# Patient Record
Sex: Male | Born: 1945 | Race: White | Hispanic: No | Marital: Married | Smoking: Former smoker
Health system: Southern US, Community
[De-identification: ages and names within clinical notes are randomized; demographics above are authoritative.]

## PROBLEM LIST (undated history)

## (undated) DIAGNOSIS — E118 Type 2 diabetes mellitus with unspecified complications: Secondary | ICD-10-CM

## (undated) DIAGNOSIS — K760 Fatty (change of) liver, not elsewhere classified: Secondary | ICD-10-CM

## (undated) DIAGNOSIS — E785 Hyperlipidemia, unspecified: Secondary | ICD-10-CM

## (undated) DIAGNOSIS — E119 Type 2 diabetes mellitus without complications: Secondary | ICD-10-CM

## (undated) DIAGNOSIS — G43009 Migraine without aura, not intractable, without status migrainosus: Secondary | ICD-10-CM

## (undated) DIAGNOSIS — I8500 Esophageal varices without bleeding: Secondary | ICD-10-CM

## (undated) DIAGNOSIS — G43019 Migraine without aura, intractable, without status migrainosus: Secondary | ICD-10-CM

## (undated) DIAGNOSIS — R3916 Straining to void: Secondary | ICD-10-CM

## (undated) DIAGNOSIS — D464 Refractory anemia, unspecified: Secondary | ICD-10-CM

## (undated) DIAGNOSIS — N6341 Unspecified lump in right breast, subareolar: Secondary | ICD-10-CM

## (undated) DIAGNOSIS — D508 Other iron deficiency anemias: Secondary | ICD-10-CM

## (undated) DIAGNOSIS — F419 Anxiety disorder, unspecified: Secondary | ICD-10-CM

## (undated) DIAGNOSIS — F3289 Other specified depressive episodes: Secondary | ICD-10-CM

## (undated) DIAGNOSIS — G2581 Restless legs syndrome: Secondary | ICD-10-CM

## (undated) DIAGNOSIS — N401 Enlarged prostate with lower urinary tract symptoms: Secondary | ICD-10-CM

## (undated) DIAGNOSIS — I959 Hypotension, unspecified: Secondary | ICD-10-CM

## (undated) DIAGNOSIS — D500 Iron deficiency anemia secondary to blood loss (chronic): Secondary | ICD-10-CM

## (undated) DIAGNOSIS — R21 Rash and other nonspecific skin eruption: Secondary | ICD-10-CM

## (undated) DIAGNOSIS — R1013 Epigastric pain: Secondary | ICD-10-CM

## (undated) DIAGNOSIS — K7469 Other cirrhosis of liver: Secondary | ICD-10-CM

## (undated) DIAGNOSIS — G894 Chronic pain syndrome: Secondary | ICD-10-CM

## (undated) DIAGNOSIS — R1012 Left upper quadrant pain: Secondary | ICD-10-CM

## (undated) DIAGNOSIS — Z794 Long term (current) use of insulin: Secondary | ICD-10-CM

## (undated) DIAGNOSIS — E1169 Type 2 diabetes mellitus with other specified complication: Secondary | ICD-10-CM

## (undated) DIAGNOSIS — IMO0002 Reserved for concepts with insufficient information to code with codable children: Secondary | ICD-10-CM

## (undated) DIAGNOSIS — K862 Cyst of pancreas: Secondary | ICD-10-CM

## (undated) DIAGNOSIS — E538 Deficiency of other specified B group vitamins: Secondary | ICD-10-CM

## (undated) DIAGNOSIS — I639 Cerebral infarction, unspecified: Secondary | ICD-10-CM

## (undated) DIAGNOSIS — G8929 Other chronic pain: Secondary | ICD-10-CM

## (undated) DIAGNOSIS — K6389 Other specified diseases of intestine: Secondary | ICD-10-CM

## (undated) DIAGNOSIS — K7581 Nonalcoholic steatohepatitis (NASH): Secondary | ICD-10-CM

## (undated) DIAGNOSIS — M51369 Other intervertebral disc degeneration, lumbar region without mention of lumbar back pain or lower extremity pain: Secondary | ICD-10-CM

## (undated) DIAGNOSIS — D696 Thrombocytopenia, unspecified: Principal | ICD-10-CM

## (undated) DIAGNOSIS — K7460 Unspecified cirrhosis of liver: Secondary | ICD-10-CM

## (undated) DIAGNOSIS — K638219 Small intestinal bacterial overgrowth, unspecified: Secondary | ICD-10-CM

## (undated) DIAGNOSIS — Z9889 Other specified postprocedural states: Secondary | ICD-10-CM

## (undated) DIAGNOSIS — M549 Dorsalgia, unspecified: Secondary | ICD-10-CM

## (undated) DIAGNOSIS — A239 Brucellosis, unspecified: Secondary | ICD-10-CM

## (undated) DIAGNOSIS — D509 Iron deficiency anemia, unspecified: Secondary | ICD-10-CM

## (undated) DIAGNOSIS — F329 Major depressive disorder, single episode, unspecified: Secondary | ICD-10-CM

## (undated) DIAGNOSIS — M5136 Other intervertebral disc degeneration, lumbar region: Secondary | ICD-10-CM

## (undated) DIAGNOSIS — F32A Depression, unspecified: Secondary | ICD-10-CM

## (undated) DIAGNOSIS — K219 Gastro-esophageal reflux disease without esophagitis: Secondary | ICD-10-CM

## (undated) DIAGNOSIS — I10 Essential (primary) hypertension: Secondary | ICD-10-CM

## (undated) HISTORY — DX: Deficiency of other specified B group vitamins: E53.8

## (undated) HISTORY — DX: Other chronic pain: G89.29

## (undated) HISTORY — DX: Gastro-esophageal reflux disease without esophagitis: K21.9

## (undated) HISTORY — DX: Reserved for concepts with insufficient information to code with codable children: IMO0002

## (undated) HISTORY — DX: Brucellosis, unspecified: A23.9

## (undated) HISTORY — DX: Esophageal varices without bleeding: I85.00

## (undated) HISTORY — DX: Nonalcoholic steatohepatitis (NASH): K75.81

## (undated) HISTORY — DX: Iron deficiency anemia, unspecified: D50.9

## (undated) HISTORY — DX: Small intestinal bacterial overgrowth, unspecified: K63.8219

## (undated) HISTORY — DX: Other specified postprocedural states: Z98.890

## (undated) HISTORY — PX: CHOLECYSTECTOMY: SHX55

## (undated) HISTORY — DX: Dorsalgia, unspecified: M54.9

## (undated) HISTORY — DX: Other specified diseases of intestine: K63.89

## (undated) HISTORY — DX: Thrombocytopenia, unspecified: D69.6

## (undated) HISTORY — DX: Cerebral infarction, unspecified: I63.9

---

## 1993-06-28 HISTORY — PX: HAND SURGERY: SHX662

## 1999-01-05 NOTE — Procedures (Signed)
[facility]                      NUCLEAR CARDIOLOGY STRESS TEST REPORT   NAME:    William Blanchard, William Blanchard                          SS#:     [id number]   DOB:     03/20/1945                                   AGE:     53   SEX:     M                                            ROOM#:   OP   MR#:     [id number]                                     DATE:    01/05/1999   REFERRING PHYS:   A. Alexander   PRETEST DATA:   ISOTOPE:  Thallium 201; Tc7m Sestamibi   INDICATION:  Palpitations, shortness of breath   MEDS TAKEN:  Prilosec   MEDS HELD:  Ativan, Cardura, Vicodin   TARGET HEART RATE:  169     85%:  144   RISK FACTORS:  Family history; hyperlipidemia; cigarette smoking habit   (3/day)   BASELINE ECG:  Normal sinus rhythm; within normal limits   EXERCISE SUPERVISED BY:  Glynda Jaeger, MSN, FNP   TEST RESULTS:                                       BRUCE PROTOCOL    STAGE   SPEED (MPH)   GRADE (%)  TIME (MIN:SEC)    HR       BP   Resting                                             86     104/62      1         1.7          10           3:00         130    120/68      2         2.5          12           3:00         153    140/79   Recovery                             Immediate      153    139/72   REASON FOR STOPPING:  Achieved target HR         TOTAL EXERCISE TIME:  6:00  ACHIEVED HEART RATE:  153 (--%  max HR)          EST. METS:  7   HR RESPONSE:  Normal                             PEAK RPP:  --   BP RESPONSE:  Normal                             CHEST PAIN:  Typical   angina   OBSERVED DYSRHYTHMIAS:  None   ST SEGMENT CHANGES:  --                                STRESS ECG REPORT   CONCLUSION:  Negative treadmill exercise  tolerance  test.  Typical  angina   with no diagnostic EKG changes.                                 NUCLEAR REPORT   FINDINGS:   (Read  with Dr. Delane Ginger.)   Nuclear  scan  of  the  myocardium   reveals symmetrical uptake of nuclear  material throughout with no  evidence   of  filling  defect.   There  is  no  evidence   of  ischemia  or  previous   infarction.   OVERALL   IMPRESSION:    Normal   Sestamibi    stress    test    with    no   electrocardiographic or scintigraphic  evidence  of  ischemia despite chest   discomfort during stress.   ECG STRESS INTERPRETATION BY:                   NUCLEAR INTERPRETATION BY:   ______________________________________          ___________________________   ______________   Harland German., M.D.                     Joan Flores, M.D.   nr D: 01/05/1999 T: 01/05/1999  3:44 P   wks
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
## 1999-08-09 NOTE — H&P (Signed)
[facility]                              HISTORY AND PHYSICAL   NAME:          William Blanchard, William Blanchard   MR #:          [id number]                     ADM DATE:         08/17/1999   BILLING #:     [id number]                    PT. LOCATION   SS #           [id number]   ATTUPURAM J. Lyn Hollingshead, M.D.   cc:   Luz Lex. Lyn Hollingshead, M.D.         ENDOSCOPY COPY   HISTORY AND REASON FOR ADMISSION: This patient is admitted for colonoscopy.   HISTORY OF PRESENT ILLNESS:  William Blanchard is a 54 year old gentleman, known   case of diverticulosis.  He had an attack of diverticulitis recently and   still continued to have some significant pain.  He had a colonoscopy done a   few years ago which revealed severe diverticular disease of the colon.   Since the patient, after antibiotic treatment of diverticular disease   continues to have crampy type of pain, although he has considerably   improved and one needs to rule out the possibility of a stricture formation   or possible other causes of this pain such as cancer.   PAST MEDICAL HISTORY:  Known case of gastroesophageal reflux disease for a   long time. He has a history of chronic bronchitis because he is a smoker.   No other significant medical problems. In particular, he never had any   blood in his stool or black stools.   SOCIAL HISTORY: The patient used to smoke quite heavily, trying to stop   smoking at the moment.  He does not abuse alcohol.   FAMILY HISTORY:  There is no history of colon cancer in the family.   REVIEW OF SYSTEMS:  Apart from the complaints in the history of present   illness, there is nothing remarkable.   ALLERGIES: ALLERGY TO PENICILLIN.   PHYSICAL EXAMINATION: The patient is alert, oriented times three.  Vital   signs - the pulse is 82 per minute, blood pressure is 112/88, height is   5'6" and weight is 156.  Temperature is 97.8.   SKIN: Warm and dry.  Mucous membranes are moist.    NECK: Supple, there is no thyromegaly.   CARDIA:  Both heart sounds are heard. Rhythm is regular, no murmurs.   LUNGS: Clear to percussion and auscultation.   ABDOMEN:  Soft, there is tenderness in the left lower quadrant, no masses   felt.   Bowel sounds are heard.   EXTREMITIES:  Free of any edema. Peripheral pulsations are equal to the   right radial pulse.   IMPRESSION:   1.  Left lower quadrant pain with history of diverticulitis, rule out colon      cancer.   RECOMMENDATIONS:   1.  Colonoscopy.  I have explained the procedure of colonoscopy with   benefit and risks, in particular risk of perforation, hemorrhage and   infection. The patient agreed for it. Will proceed with it.  I also  explained to the patient about conscious sedation. He agreed for conscious   sedation.

## 1999-08-17 NOTE — Op Note (Signed)
[facility]                                OPERATION REPORT   NAME:         William Blanchard, William Blanchard   MR #:         [id number]                    DATE:           08/17/1999   BILLING #:                                PT. LOCATION:   SS #          [id number]   ATTUPURAM J. Lyn Hollingshead, M.D.   cc:   Luz Lex. Lyn Hollingshead, M.D.   PROCEDURE (S) PERFORMED   Colonoscopy.   DATE OF PROCEDURE:  August 17, 1999.   OPERATOR:  A. Lyn Hollingshead, M.D.   PREMEDICATION:   They were all given by the anesthesiologist.  The patient   was monitored before, during and after the procedure by anesthesiologist.   His condition remained stable. Propofol 170 mg and Versed 3 mg IV push.   Distal part of the cecum, part of sacrum, from different parts of the   colon.    Specimens from the right colon, descending colon, sigmoid colon and   rectum.   INDICATIONS FOR THE PROCEDURE:   This patient has ________and left lower   quadrant pain.  He had treatment for diverticulitis.  He also had a history   of diverticulitis.  The pain is persistent.  This procedure is done to rule   out the possibility of other causes of this pain such as colon cancer or   any other type of colitis.   DESCRIPTION OF OPERATIVE PROCEDURE AND FINDINGS:   The patient was placed   in the left lateral decubitus position and rectal examination was   performed.  The anal canal  __________.  The Olympus video scope was   introduced into the rectum and advanced to the cecum.  The patient   tolerated the procedure well.  The right side of the colon, especially in   the cecum, had some solid stool therefore the bottom of the cecum was   actually not seen.   The rectum had a few hemorrhoids present in the anal verge.  There were a   few erosions in the upper part of the rectum.  The sigmoid was tortuous.   There were multiple diverticula present with a lot of erosions in the folds    without any ulcerations.  The descending colon had a few diverticula with   erosions.  The splenic flexure also had a few erosions present.  The   transverse colon appeared normal.  The hepatic flexure appeared normal.   The ascending colon was normal.  The cecum was also normal in the   visualized area.  Air was taken out of the patient's colon and scope was   removed from the patient.   IMPRESSION   1.  Erosive colitis involving the left colon.   2.  Diverticulosis of the left colon.   RECOMMENDATIONS:  Await biopsy results.  I do not believe that this patient   has got  simply diverticulitis.  He may have another type of colitis.  I  was unable to collect any stool because the stool, which was present in the   colon, was solid in the right side and the left side did not have any   stool.

## 2008-06-28 DIAGNOSIS — Z9889 Other specified postprocedural states: Secondary | ICD-10-CM

## 2008-06-28 HISTORY — DX: Other specified postprocedural states: Z98.890

## 2009-12-07 ENCOUNTER — Ambulatory Visit: Payer: Self-pay | Admitting: Cardiology

## 2009-12-07 ENCOUNTER — Inpatient Hospital Stay (HOSPITAL_COMMUNITY): Admission: EM | Admit: 2009-12-07 | Discharge: 2009-12-09 | Payer: Self-pay | Admitting: Emergency Medicine

## 2009-12-08 ENCOUNTER — Encounter (INDEPENDENT_AMBULATORY_CARE_PROVIDER_SITE_OTHER): Payer: Self-pay | Admitting: Internal Medicine

## 2010-01-16 ENCOUNTER — Ambulatory Visit (HOSPITAL_COMMUNITY): Admission: RE | Admit: 2010-01-16 | Discharge: 2010-01-16 | Payer: Self-pay | Admitting: Family Medicine

## 2010-02-10 ENCOUNTER — Encounter (INDEPENDENT_AMBULATORY_CARE_PROVIDER_SITE_OTHER): Payer: Self-pay | Admitting: *Deleted

## 2010-02-10 ENCOUNTER — Ambulatory Visit: Payer: Self-pay | Admitting: Cardiology

## 2010-02-10 DIAGNOSIS — Z87898 Personal history of other specified conditions: Secondary | ICD-10-CM

## 2010-02-10 DIAGNOSIS — R002 Palpitations: Secondary | ICD-10-CM

## 2010-02-10 DIAGNOSIS — E785 Hyperlipidemia, unspecified: Secondary | ICD-10-CM

## 2010-02-10 DIAGNOSIS — F329 Major depressive disorder, single episode, unspecified: Secondary | ICD-10-CM

## 2010-02-10 DIAGNOSIS — E1149 Type 2 diabetes mellitus with other diabetic neurological complication: Secondary | ICD-10-CM

## 2010-02-10 DIAGNOSIS — G43909 Migraine, unspecified, not intractable, without status migrainosus: Secondary | ICD-10-CM

## 2010-02-10 LAB — CONVERTED CEMR LAB
Magnesium: 2.1 mg/dL
Magnesium: 2.1 mg/dL (ref 1.5–2.5)
TSH: 1.331 microintl units/mL
TSH: 1.331 microintl units/mL (ref 0.350–4.500)

## 2010-02-11 ENCOUNTER — Encounter (INDEPENDENT_AMBULATORY_CARE_PROVIDER_SITE_OTHER): Payer: Self-pay | Admitting: *Deleted

## 2010-02-11 LAB — CONVERTED CEMR LAB
Magnesium: 2.1 mg/dL
TSH: 1.331 microintl units/mL

## 2010-02-13 ENCOUNTER — Ambulatory Visit: Payer: Self-pay | Admitting: Cardiology

## 2010-02-17 ENCOUNTER — Encounter (INDEPENDENT_AMBULATORY_CARE_PROVIDER_SITE_OTHER): Payer: Self-pay

## 2010-02-18 ENCOUNTER — Encounter (INDEPENDENT_AMBULATORY_CARE_PROVIDER_SITE_OTHER): Payer: Self-pay | Admitting: *Deleted

## 2010-02-19 ENCOUNTER — Encounter: Payer: Self-pay | Admitting: Cardiology

## 2010-02-25 ENCOUNTER — Telehealth (INDEPENDENT_AMBULATORY_CARE_PROVIDER_SITE_OTHER): Payer: Self-pay | Admitting: *Deleted

## 2010-02-27 ENCOUNTER — Encounter: Payer: Self-pay | Admitting: Adult Health

## 2010-02-27 ENCOUNTER — Ambulatory Visit: Payer: Self-pay | Admitting: Cardiology

## 2010-02-27 DIAGNOSIS — I6350 Cerebral infarction due to unspecified occlusion or stenosis of unspecified cerebral artery: Secondary | ICD-10-CM | POA: Insufficient documentation

## 2010-02-27 DIAGNOSIS — N2889 Other specified disorders of kidney and ureter: Secondary | ICD-10-CM | POA: Insufficient documentation

## 2010-03-04 ENCOUNTER — Encounter: Payer: Self-pay | Admitting: Adult Health

## 2010-03-07 ENCOUNTER — Encounter: Payer: Self-pay | Admitting: Cardiology

## 2010-03-17 ENCOUNTER — Encounter: Payer: Self-pay | Admitting: Cardiology

## 2010-03-20 ENCOUNTER — Encounter (INDEPENDENT_AMBULATORY_CARE_PROVIDER_SITE_OTHER): Payer: Self-pay | Admitting: *Deleted

## 2010-04-09 ENCOUNTER — Ambulatory Visit (HOSPITAL_COMMUNITY): Admission: RE | Admit: 2010-04-09 | Discharge: 2010-04-09 | Payer: Self-pay | Admitting: Cardiology

## 2010-04-09 ENCOUNTER — Ambulatory Visit: Payer: Self-pay | Admitting: Cardiology

## 2010-04-09 DIAGNOSIS — J209 Acute bronchitis, unspecified: Secondary | ICD-10-CM

## 2010-04-09 LAB — CONVERTED CEMR LAB
ALT: 32 units/L (ref 0–53)
AST: 36 units/L (ref 0–37)
Albumin: 4.9 g/dL (ref 3.5–5.2)
Alkaline Phosphatase: 176 units/L — ABNORMAL HIGH (ref 39–117)
BUN: 17 mg/dL (ref 6–23)
Basophils Absolute: 0 10*3/uL (ref 0.0–0.1)
Basophils Relative: 1 % (ref 0–1)
CO2: 18 meq/L — ABNORMAL LOW (ref 19–32)
Calcium: 9.7 mg/dL (ref 8.4–10.5)
Chloride: 104 meq/L (ref 96–112)
Cholesterol: 122 mg/dL (ref 0–200)
Creatinine, Ser: 1.2 mg/dL (ref 0.40–1.50)
Eosinophils Absolute: 0.1 10*3/uL (ref 0.0–0.7)
Eosinophils Relative: 2 % (ref 0–5)
Glucose, Bld: 212 mg/dL — ABNORMAL HIGH (ref 70–99)
HCT: 38.2 % — ABNORMAL LOW (ref 39.0–52.0)
HDL: 26 mg/dL — ABNORMAL LOW (ref >39)
Hemoglobin: 12.3 g/dL — ABNORMAL LOW (ref 13.0–17.0)
Lymphocytes Relative: 32 % (ref 12–46)
Lymphs Abs: 1.5 10*3/uL (ref 0.7–4.0)
MCHC: 32.2 g/dL (ref 30.0–36.0)
MCV: 93.6 fL (ref 78.0–100.0)
Monocytes Absolute: 0.4 10*3/uL (ref 0.1–1.0)
Monocytes Relative: 9 % (ref 3–12)
Neutro Abs: 2.6 10*3/uL (ref 1.7–7.7)
Neutrophils Relative %: 56 % (ref 43–77)
Platelets: 108 10*3/uL — ABNORMAL LOW (ref 150–400)
Potassium: 4.6 meq/L (ref 3.5–5.3)
RBC: 4.08 M/uL — ABNORMAL LOW (ref 4.22–5.81)
RDW: 15 % (ref 11.5–15.5)
Sodium: 136 meq/L (ref 135–145)
Total Bilirubin: 0.4 mg/dL (ref 0.3–1.2)
Total CHOL/HDL Ratio: 4.7
Total Protein: 7.6 g/dL (ref 6.0–8.3)
Triglycerides: 478 mg/dL — ABNORMAL HIGH (ref <150)
WBC: 4.6 10*3/uL (ref 4.0–10.5)

## 2010-04-14 ENCOUNTER — Encounter (INDEPENDENT_AMBULATORY_CARE_PROVIDER_SITE_OTHER): Payer: Self-pay | Admitting: *Deleted

## 2010-05-18 ENCOUNTER — Encounter
Admission: RE | Admit: 2010-05-18 | Discharge: 2010-06-17 | Payer: Self-pay | Source: Home / Self Care | Admitting: Family Medicine

## 2010-07-28 NOTE — Miscellaneous (Signed)
Summary: labs magnesium,tsh,02/11/2010  Clinical Lists Changes  Observations: Added new observation of MAGNESIUM: 2.1 mg/dL (78/29/5621 30:86) Added new observation of TSH: 1.331 microintl units/mL (02/11/2010 13:08)

## 2010-07-28 NOTE — Letter (Signed)
Summary: EKG 12/23/04  EKG 12/23/04   Imported By: Faythe Ghee 03/17/2010 10:07:21  _____________________________________________________________________  External Attachment:    Type:   Image     Comment:   External Document

## 2010-07-28 NOTE — Assessment & Plan Note (Signed)
Summary: 1 MTH F/U PER CHECKOUT ON 02/10/10/TG   Visit Type:  Follow-up Primary Provider:  Dr.Stephen [name]  CC:  chest pain and sob.  History of Present Illness: Brad Wall is a 65 y/o CM who presents to the office for recurrent chest pain and palpatations.  He was last seen by Dr. Dietrich Pates on 02/10/2010 for these complaints. He has a history of hypertension, newly diagnosed DM, CVA, L renal infarct, with negative stress test in Feb. 2011 in [place]. Records have been requested and received, being reviewed by Dr. Dietrich Pates at t his time.  He took off cardionet monitor as he got a letter in the mail informing him to return it after 1 1/2 weeks.  One week later he began chest discomfort described as tightness and burning substernally lasting for 2 days.  The pain was constant, it did not radiate, cause shortness of breath, diaphoresis or dizziness.  He informed our office of these symptoms, but did not seek medical care at ER.  He has not had chest discomfort since that time, but remains concerned about the episode.    Current Medications (verified): 1)  Citalopram Hydrobromide 40 Mg Tabs (Citalopram Hydrobromide) .... Take 1 Tab Daily 2)  Aspirin 325 Mg Tabs (Aspirin) .... Take 1 Tab Daily 3)  Topamax 50 Mg Tabs (Topiramate) .... Take 1 Tab Am 2 Tabs Pm 4)  Nortriptyline Hcl 75 Mg Caps (Nortriptyline Hcl) .... Take 1 Tab At At Bedtime 5)  Ativan 1 Mg Tabs (Lorazepam) .... Take As Needed 6)  Flexeril 10 Mg Tabs (Cyclobenzaprine Hcl) .... Take 1 Tab Two Times A Day 7)  Flomax 0.4 Mg Caps (Tamsulosin Hcl) .... Take 1 Tab Daily 8)  Lopid 600 Mg Tabs (Gemfibrozil) .... Take 1 Tab Daily 9)  Metformin Hcl 500 Mg Tabs (Metformin Hcl) .... Take 1 Tab Two Times A Day 10)  Niaspan 500 Mg Cr-Tabs (Niacin (Antihyperlipidemic)) .... Take 1 Tab Daily 11)  Norco 10-325 Mg Tabs (Hydrocodone-Acetaminophen) .... Take As Needed 12)  Prilosec 20 Mg Cpdr (Omeprazole) .... Take 1 Tab Two Times A Day 13)   Tenormin 50 Mg Tabs (Atenolol) .... Take 1 Tab Daily 14)  Zocor 10 Mg Tabs (Simvastatin) .... Take 1 Tab Daily 15)  Niaspan 500 Mg Cr-Tabs (Niacin (Antihyperlipidemic)) .... Take 1 Tab Daily 16)  Fioricet 50-325-40 Mg Tabs (Butalbital-Apap-Caffeine) .... Take As Needed For Migrains 17)  Atenolol 50 Mg Tabs (Atenolol) .... Take 1 Tab Daily 18)  Ketorolac Tromethamine 10 Mg Tabs (Ketorolac Tromethamine) .... Take As Needed For Migrains 19)  Prednisone 5 Mg Tabs (Prednisone) .... Take 2 Tabs Am  Allergies (verified): 1)  ! Penicillin  Past History:  Past medical, surgical, family and social histories (including risk factors) reviewed, and no changes noted (except as noted below).  Past Medical History: Reviewed history from 02/10/2010 and no changes required. 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 Cholecystectomy-2011 Colonoscopyx 7; excision of polypsx2 Right hand surgical procedure  Family History: Reviewed history from 02/10/2010 and no changes required. Father died at age 29 with a history of carcinoma of the colon and the lung Mother died at age 67 due to Alzheimer's Siblings: A brother has diabetes.  A sister has been diagnosed with cardiac problems.  Social History: Reviewed history from 02/10/2010 and no changes required. 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   Vital Signs:  Patient profile:   65 year old male Weight:      165 pounds BMI:     24.45 Pulse rate:   84 /  minute BP sitting:   115 / 73  (right arm)  Vitals Entered By: Dreama Saa, CNA (February 27, 2010 1:09 PM)  Physical Exam  General:  Well developed, well nourished, in no acute distress. Lungs:  Clear bilaterally to auscultation and percussion. Heart:  Non-displaced PMI, chest non-tender; regular rate and rhythm, S1, S2 without murmurs, rubs or gallops. Carotid upstroke normal, no bruit. Normal abdominal aortic size, no bruits. Femorals normal pulses, no bruits. Pedals normal pulses. No edema, no varicosities. Abdomen:  Bowel sounds positive; abdomen soft and non-tender without masses, organomegaly, or hernias noted. No hepatosplenomegaly. Msk:  Back normal, normal gait. Muscle strength and tone normal. Pulses:  pulses normal in all 4 extremities Extremities:  No clubbing or cyanosis. Neurologic:  slow speech pattern Psych:  depressed affect.     EKG  Procedure date:  02/27/2010  Findings:      Normal sinus rhythm with rate of:  80 bpm  Impression & Recommendations:  Problem # 1:  CHEST PAIN UNSPECIFIED (ICD-786.50) [name] experienced 2 days of unrelenting epigasteric chest pain approximately 1 week ago at rest. He has had some fatigue since that time.  He denies any other associated symptoms.  This does not appear to be cardiac in etiiology as it was constant for 48 hour length of time.  Review of EKG did not show any evidence of ischemia.  The cardiionet montior has not been read yet.  This may be gastroporesis in the setting of diabetes.  He has had a GI specialist in [facility], but has not followed one since that time.  Would recommend that he be referred to GI and endocrinologist concerning DM symptoms and treatment.  He will keep his appointment with Dr. Dietrich Pates in 2 weeks for discussion of cardiionet and other symptoms.  He is already on a PPI.  No changes in medications at this time. His updated medication list for this problem includes:    Aspirin 325 Mg Tabs (Aspirin)  .Marland Kitchen... Take 1 tab daily    Tenormin 50 Mg Tabs (Atenolol) .Marland Kitchen... Take 1 tab daily    Atenolol 50 Mg Tabs (Atenolol) .Marland Kitchen... Take 1 tab daily  Problem # 2:  DIABETES MELLITUS, TYPE II (ICD-250.00) Assessment: Comment Only follow-up with primary or endocrinologist. His updated medication list for this problem includes:    Aspirin 325 Mg Tabs (Aspirin) .Marland Kitchen... Take 1 tab daily    Metformin Hcl 500 Mg Tabs (Metformin hcl) .Marland Kitchen... Take 1 tab two times a day  Patient Instructions: 1)  Your physician recommends that you schedule a follow-up appointment in: 3 weeks 2)  Your physician recommends that you continue on your current medications as directed. Please refer to the Current Medication list given to you today.

## 2010-07-28 NOTE — Progress Notes (Signed)
Summary: CHERST PAIN / HEART MONITOR  Phone Note Call from Patient Call back at Home Phone [phone]   Caller: PT Reason for Call: Talk to Nurse Summary of Call: PT WAS WEARING HEART MONITOR FOR ABOUT THREE WEEKS AND WAS TOLD TO SEND IT BACK AND YESTERDAY HE HAD THE WORST CHEST PAIN HE HAS EVER HAD DOES HE NEED TO GO BACK ON THE MONITOR? Initial call taken by: Faythe Ghee,  February 25, 2010 10:50 AM  Follow-up for Phone Call        pt sent monitor back early when he received  his bill, pt thought hehad  completed the test. I sent for a full report. placed in your box for review. I also scheduled pt to see KL Friday for increased cp Follow-up by: Teressa Lower RN,  February 25, 2010 4:18 PM
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
## 2010-07-28 NOTE — Miscellaneous (Signed)
Summary: Records received from Dr. [name]  Received requested records from Dr. Michelle Nasuti office on pt. Records given to Dr. Dietrich Pates for reveiw.
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
## 2010-07-28 NOTE — Letter (Signed)
Summary: LABS 04-02-2009  LABS 04-02-2009   Imported By: Faythe Ghee 03/17/2010 10:08:11  _____________________________________________________________________  External Attachment:    Type:   Image     Comment:   External Document

## 2010-07-28 NOTE — Procedures (Signed)
Summary: [facility] 02/13/10-02/20/10  [facility] 02/13/10-02/20/10   Imported By: Faythe Ghee 03/17/2010 10:06:34  _____________________________________________________________________  External Attachment:    Type:   Image     Comment:   External Document

## 2010-07-28 NOTE — Letter (Signed)
Summary: CT ABDOMEN 06/10/04  CT ABDOMEN 06/10/04   Imported By: Faythe Ghee 03/17/2010 10:10:07  _____________________________________________________________________  External Attachment:    Type:   Image     Comment:   External Document

## 2010-07-28 NOTE — Letter (Signed)
Summary: [place] Results Engineer, agricultural at [facility]  [address] [place] [address]   Phone: [phone]  Fax: [phone]      April 14, 2010 MRN: [id number]   Brad Wall [address] [place], [place]  [address]   Dear Mr. [name],  Your test ordered by Selena Batten has been reviewed by your physician (or physician assistant) and was found to be normal or stable. Your physician (or physician assistant) felt no changes were needed at this time.  ____ Echocardiogram  ____ Cardiac Stress Test  __x__ Lab Work  ____ Peripheral vascular study of arms, legs or neck  ____ CT scan or X-ray  ____ Lung or Breathing test  ____ Other:  No change in medical treatment at this time, per Dr. Dietrich Pates.  Thank you, [place] Folden Allyne Gee RN    [place] Bing, MD, Lenise Arena.C.Gaylord Shih, MD, F.A.C.C Lewayne Bunting, MD, F.A.C.C Nona Dell, MD, F.A.C.C Charlton Haws, MD, Lenise Arena.C.C

## 2010-07-28 NOTE — Assessment & Plan Note (Signed)
Summary: 3 wk f/u per checkout on 02/27/10/tg   Visit Type:  Follow-up Primary Provider:  Dr.Stephen [name]   History of Present Illness: [name] returns to the office for continued assessment and treatment of palpitations.  Since his last visit, he has done fairly well.  He has occasional momentary episodes associated with very brief periods of dizziness.  Sometimes he staggers, but does not fall to the ground.  He carried an event recorder, identifying 3 episodes with these symptoms.  No arrhythmias were identified.  He did have infrequent PACs and PVCs at other times.  He reports a few day history of cough with yellow and gray sputum production fleckedt with blood.  He has pain in the back of his throat that he attributes to tonsillitis.  He has some anterior chest discomfort without tenderness and without a pleuritic component.  He has not been dyspneic, but feels punky.  He has a chronic low-grade fever that has not changed.  He developed pneumonia in late 2010 and wonders whether this is a recurrence.  He has had no rigors.  He has not noted nasal discharge, epistaxis or other nasal or sinus problems.  Records previously requested from [facility] were obtained and reviewed.  He was admitted with chest pain in 2005 at which time a stress thallium study was negative.  Diagnoses during that admission included gastroesophageal reflux disease, hyperlipidemia, benign prostatic hypertrophy and degenerative joint disease of the spine with chronic back pain.  He was said to have a history of diverticulitis and rheumatic fever as a child.  A CT scan of the abdomen revealed diffuse fatty infiltration of the liver and minimal renal calcification, but was otherwise normal.  Current Medications (verified): 1)  Citalopram Hydrobromide 40 Mg Tabs (Citalopram Hydrobromide) .... Take 1 Tab Daily 2)  Aspirin 325 Mg Tabs (Aspirin) .... Take 1 Tab Daily 3)  Topamax 50 Mg Tabs (Topiramate) .... Take 1  Tab Am 2 Tabs Pm 4)  Nortriptyline Hcl 75 Mg Caps (Nortriptyline Hcl) .... Take 1 Tab At At Bedtime 5)  Ativan 1 Mg Tabs (Lorazepam) .... Take As Needed 6)  Flexeril 10 Mg Tabs (Cyclobenzaprine Hcl) .... Take 1 Tab Two Times A Day 7)  Flomax 0.4 Mg Caps (Tamsulosin Hcl) .... Take 1 Tab Daily 8)  Lopid 600 Mg Tabs (Gemfibrozil) .... Take 1 Tab Daily 9)  Metformin Hcl 500 Mg Tabs (Metformin Hcl) .... Take 1 Tab Two Times A Day 10)  Niaspan 500 Mg Cr-Tabs (Niacin (Antihyperlipidemic)) .... Take 1 Tab Daily 11)  Norco 10-325 Mg Tabs (Hydrocodone-Acetaminophen) .... Take As Needed 12)  Prilosec 20 Mg Cpdr (Omeprazole) .... Take 1 Tab Two Times A Day 13)  Tenormin 50 Mg Tabs (Atenolol) .... Take 1 Tab Daily 14)  Zocor 10 Mg Tabs (Simvastatin) .... Take 1 Tab Daily 15)  Niaspan 500 Mg Cr-Tabs (Niacin (Antihyperlipidemic)) .... Take 1 Tab Daily 16)  Fioricet 50-325-40 Mg Tabs (Butalbital-Apap-Caffeine) .... Take As Needed For Migrains 17)  Atenolol 50 Mg Tabs (Atenolol) .... Take 1 Tab Daily 18)  Ketorolac Tromethamine 10 Mg Tabs (Ketorolac Tromethamine) .... Take As Needed For Migrains 19)  Reglan 5 Mg/ml Soln (Metoclopramide Hcl) .... Take 1 Tab Qid 20)  Levaquin 500 Mg Tabs (Levofloxacin) .... Take 1 Tablet By Mouth Once A Day X 7 Days  Allergies (verified): 1)  ! Penicillin  Comments:  Nurse/Medical Assistant: patient was started on reglan 5 mg qid per Dr.Knowlton and prednisone  stopped  Past  History:  PMH, FH, and Social History reviewed and updated.  Past Medical History: Chest pain-neg. 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  The patient denies anorexia, fever, weight loss, weight gain, vision loss, hoarseness, syncope, dyspnea on exertion, peripheral edema, headaches, abdominal pain, melena, and hematochezia.    Vital Signs:  Patient profile:   65 year old male Weight:      170 pounds BMI:     25.20 Pulse rate:   72 / minute BP sitting:   100 / 72  (right arm)  Vitals Entered By: Dreama Saa, CNA (April 09, 2010 11:08 AM)  Physical Exam  General:  Proportionate weight and height; well developed; no acute distress; appears mildly anxious and depressed.   Neck-No JVD; no carotid bruits: Endocrine-No thyromegaly: Lungs-No tachypnea, clear without rales, rhonchi or wheezes: CV-normal PMI; normal S1 and S2:; minimal systolic ejection murmur  Abdomen-BS normal; soft and non-tender without masses or organomegaly: MS-No deformities, cyanosis or clubbing: Neurologic-holding speech with difficulty finding words; Nl cranial nerves; symmetric strength and tone: Skin- Warm, no sig. lesions: Extremities-Nl distal pulses; no edema   Impression & Recommendations:  Problem # 1:  PALPITATIONS (ICD-785.1) Event recorder shows no significant arrhythmias and no correlation between rhythm and symptoms.  Magnesium level and TSH are normal.  No further evaluation or therapy is warranted.  Problem # 2:  HEMOPTYSIS (ICD-786.30) No sputum is available for me to examine, but patient and his wife both agree that he has had flecks of blood and otherwise fairly benign appearing sputum.  He has no significant fever and no abnormal findings on examination of the lungs.  This likely  represents bronchitis.  He will be treated with a course of levofloxacin and will follow up with Dr. [name].  A chest x-ray, CBC and other basic laboratory tests are pending.  Problem # 3:  CHEST PAIN (ICD-786.50) Symptoms are now quite modest.  With a negative stress test and no known vascular disease, no further testing will be undertaken at the present time.  Was my pleasure evaluating this nice gentleman.  I will be happy to see him at anytime in the future that Dr. [name] believes I can assist with his care.  Other Orders: T-CBC w/Diff [phone]) T-Lipid Profile [id number]) T-Comprehensive Metabolic Panel [phone]) T-Chest x-ray, 2 views ([id number])  Patient Instructions: 1)  Your physician recommends that you schedule a follow-up appointment in: AS NEEDED 2)  Your physician recommends that you return for lab work in: TODAY 3)  Your physician has recommended you make the following change in your medication: LEVAQIN 500MG  DAILY X 7 DAYS COMPLETE ENITRE ANTIBIOTIC 4)  You have been referred to FOLLOW UP WITH DR.KNOWLTON WITHIN THE WEEK 5)  A chest x-ray takes a picture of the organs and structures inside the chest, including the heart, lungs, and blood vessels. This test can show several things, including, whether the heart is enlarged; whether fluid is building up in the lungs;  and whether pacemaker / defibrillator leads are still in place. 6)  CALL TO THE ED FOR HIGH FEVER , PROFUSE BLEEDING, CONFUSION, OR SIGNIFICANT INCREASE IN SYMPTOMS   Prescriptions: LEVAQUIN 500 MG TABS (LEVOFLOXACIN) Take 1 tablet by mouth once a day x 7 days  #7 x 0   Entered by:   Teressa Lower RN   Authorized by:   Kathlen Brunswick, MD, [facility]   Signed by:   Teressa Lower RN on 04/09/2010   Method used:   Electronically to        [facility]  [address] (retail)       [address]       [place], [place]  [address]       Ph: [phone]       Fax: [phone]   RxID:    [id number]   Handout requested.

## 2010-07-28 NOTE — Miscellaneous (Signed)
Summary: LABS MAGNESIUM,TSH,02/10/2010  Clinical Lists Changes  Observations: Added new observation of MAGNESIUM: 2.1 mg/dL (09/81/1914 78:29) Added new observation of TSH: 1.331 microintl units/mL (02/10/2010 10:46)

## 2010-07-28 NOTE — Letter (Signed)
Summary: [facility] EXERCISE STRESS 04/30/04  [facility] EXERCISE STRESS 04/30/04   Imported By: Faythe Ghee 03/17/2010 10:09:31  _____________________________________________________________________  External Attachment:    Type:   Image     Comment:   External Document

## 2010-07-28 NOTE — Letter (Signed)
Summary:  Results Engineer, agricultural at [facility]  [address], [place] [address]   Phone: [phone]  Fax: [phone]      February 19, 2010 MRN: [id number]   Brad Wall [address] [place], [place]  [address]   Dear Mr. [name],  Your test ordered by Selena Batten has been reviewed by your physician (or physician assistant) and was found to be normal or stable. Your physician (or physician assistant) felt no changes were needed at this time.  ____ Echocardiogram  ____ Cardiac Stress Test  __X__ Lab Work  ____ Peripheral vascular study of arms, legs or neck  ____ CT scan or X-ray  ____ Lung or Breathing test  ____ Other: Please continue on current medical treatment.  Thank you.   [name], MD, F.A.C.C

## 2010-07-28 NOTE — Letter (Signed)
Summary: [facility] HEALTHCARE DISCHARGE SUMMARY 04/29/04  [facility] HEALTHCARE DISCHARGE SUMMARY 04/29/04   Imported By: Faythe Ghee 03/17/2010 10:08:50  _____________________________________________________________________  External Attachment:    Type:   Image     Comment:   External Document

## 2010-08-31 ENCOUNTER — Other Ambulatory Visit (HOSPITAL_COMMUNITY): Payer: Self-pay | Admitting: Family Medicine

## 2010-08-31 DIAGNOSIS — R1013 Epigastric pain: Secondary | ICD-10-CM

## 2010-09-03 ENCOUNTER — Ambulatory Visit (HOSPITAL_COMMUNITY): Payer: Medicare Other

## 2010-09-07 ENCOUNTER — Inpatient Hospital Stay (HOSPITAL_COMMUNITY)
Admission: EM | Admit: 2010-09-07 | Discharge: 2010-09-09 | DRG: 641 | Disposition: A | Discharge status: Home / Self Care | Payer: Medicare Other | Attending: Family Medicine | Admitting: Family Medicine

## 2010-09-07 DIAGNOSIS — K2100 Gastro-esophageal reflux disease with esophagitis, without bleeding: Secondary | ICD-10-CM | POA: Diagnosis present

## 2010-09-07 DIAGNOSIS — A088 Other specified intestinal infections: Secondary | ICD-10-CM | POA: Diagnosis present

## 2010-09-07 DIAGNOSIS — D509 Iron deficiency anemia, unspecified: Secondary | ICD-10-CM | POA: Diagnosis present

## 2010-09-07 DIAGNOSIS — E7800 Pure hypercholesterolemia, unspecified: Secondary | ICD-10-CM | POA: Diagnosis present

## 2010-09-07 DIAGNOSIS — I951 Orthostatic hypotension: Secondary | ICD-10-CM | POA: Diagnosis present

## 2010-09-07 DIAGNOSIS — F329 Major depressive disorder, single episode, unspecified: Secondary | ICD-10-CM | POA: Diagnosis present

## 2010-09-07 DIAGNOSIS — R161 Splenomegaly, not elsewhere classified: Secondary | ICD-10-CM | POA: Diagnosis present

## 2010-09-07 DIAGNOSIS — N400 Enlarged prostate without lower urinary tract symptoms: Secondary | ICD-10-CM | POA: Diagnosis present

## 2010-09-07 DIAGNOSIS — D696 Thrombocytopenia, unspecified: Secondary | ICD-10-CM | POA: Diagnosis present

## 2010-09-07 DIAGNOSIS — F3289 Other specified depressive episodes: Secondary | ICD-10-CM | POA: Diagnosis present

## 2010-09-07 DIAGNOSIS — F05 Delirium due to known physiological condition: Secondary | ICD-10-CM | POA: Diagnosis present

## 2010-09-07 DIAGNOSIS — F411 Generalized anxiety disorder: Secondary | ICD-10-CM | POA: Diagnosis present

## 2010-09-07 DIAGNOSIS — E860 Dehydration: Principal | ICD-10-CM | POA: Diagnosis present

## 2010-09-07 DIAGNOSIS — G43909 Migraine, unspecified, not intractable, without status migrainosus: Secondary | ICD-10-CM | POA: Diagnosis present

## 2010-09-07 HISTORY — DX: Essential (primary) hypertension: I10

## 2010-09-07 LAB — URINALYSIS, ROUTINE W REFLEX MICROSCOPIC
Bilirubin Urine: NEGATIVE
Glucose, UA: NEGATIVE mg/dL
Hgb urine dipstick: NEGATIVE
Ketones, ur: NEGATIVE mg/dL
Nitrite: NEGATIVE
Protein, ur: NEGATIVE mg/dL
Specific Gravity, Urine: 1.01 (ref 1.005–1.030)
Urobilinogen, UA: 0.2 mg/dL (ref 0.0–1.0)
pH: 5.5 (ref 5.0–8.0)

## 2010-09-07 LAB — DIFFERENTIAL
Basophils Relative: 1 % (ref 0–1)
Eosinophils Absolute: 0.1 10*3/uL (ref 0.0–0.7)
Eosinophils Relative: 2 % (ref 0–5)
Lymphs Abs: 1.6 10*3/uL (ref 0.7–4.0)
Monocytes Absolute: 0.4 10*3/uL (ref 0.1–1.0)
Monocytes Relative: 9 % (ref 3–12)
Neutro Abs: 2.2 10*3/uL (ref 1.7–7.7)
Neutrophils Relative %: 51 % (ref 43–77)

## 2010-09-07 LAB — COMPREHENSIVE METABOLIC PANEL
AST: 41 U/L — ABNORMAL HIGH (ref 0–37)
Albumin: 3.5 g/dL (ref 3.5–5.2)
Alkaline Phosphatase: 124 U/L — ABNORMAL HIGH (ref 39–117)
BUN: 11 mg/dL (ref 6–23)
CO2: 18 mEq/L — ABNORMAL LOW (ref 19–32)
Calcium: 9.2 mg/dL (ref 8.4–10.5)
Chloride: 109 mEq/L (ref 96–112)
Creatinine, Ser: 1.13 mg/dL (ref 0.4–1.5)
GFR calc Af Amer: >60 mL/min (ref >60)
GFR calc non Af Amer: >60 mL/min (ref >60)
Glucose, Bld: 105 mg/dL — ABNORMAL HIGH (ref 70–99)
Potassium: 3.9 mEq/L (ref 3.5–5.1)
Total Bilirubin: 0.5 mg/dL (ref 0.3–1.2)
Total Protein: 6.4 g/dL (ref 6.0–8.3)

## 2010-09-07 LAB — CBC
HCT: 30.7 % — ABNORMAL LOW (ref 39.0–52.0)
Hemoglobin: 10.3 g/dL — ABNORMAL LOW (ref 13.0–17.0)
MCH: 28.5 pg (ref 26.0–34.0)
MCHC: 33.6 g/dL (ref 30.0–36.0)
MCV: 84.8 fL (ref 78.0–100.0)
Platelets: 97 10*3/uL — ABNORMAL LOW (ref 150–400)
RBC: 3.62 MIL/uL — ABNORMAL LOW (ref 4.22–5.81)
WBC: 4.4 10*3/uL (ref 4.0–10.5)

## 2010-09-07 LAB — GLUCOSE, CAPILLARY: Glucose-Capillary: 105 mg/dL — ABNORMAL HIGH (ref 70–99)

## 2010-09-08 ENCOUNTER — Inpatient Hospital Stay (HOSPITAL_COMMUNITY): Payer: Medicare Other

## 2010-09-08 ENCOUNTER — Ambulatory Visit (HOSPITAL_COMMUNITY): Payer: Medicare Other

## 2010-09-08 ENCOUNTER — Encounter (HOSPITAL_COMMUNITY): Payer: Self-pay | Admitting: Radiology

## 2010-09-08 DIAGNOSIS — R109 Unspecified abdominal pain: Secondary | ICD-10-CM

## 2010-09-08 DIAGNOSIS — R197 Diarrhea, unspecified: Secondary | ICD-10-CM

## 2010-09-08 DIAGNOSIS — D649 Anemia, unspecified: Secondary | ICD-10-CM

## 2010-09-08 LAB — IRON AND TIBC
Iron: 43 ug/dL (ref 42–135)
UIBC: 327 ug/dL

## 2010-09-08 LAB — CBC
HCT: 29.9 % — ABNORMAL LOW (ref 39.0–52.0)
Hemoglobin: 9.6 g/dL — ABNORMAL LOW (ref 13.0–17.0)
MCH: 27.4 pg (ref 26.0–34.0)
MCHC: 32.1 g/dL (ref 30.0–36.0)
Platelets: 92 10*3/uL — ABNORMAL LOW (ref 150–400)
RBC: 3.51 MIL/uL — ABNORMAL LOW (ref 4.22–5.81)
RDW: 15 % (ref 11.5–15.5)
WBC: 4 10*3/uL (ref 4.0–10.5)

## 2010-09-08 LAB — DIFFERENTIAL
Basophils Absolute: 0 10*3/uL (ref 0.0–0.1)
Basophils Relative: 1 % (ref 0–1)
Eosinophils Absolute: 0.1 10*3/uL (ref 0.0–0.7)
Eosinophils Relative: 2 % (ref 0–5)
Lymphocytes Relative: 37 % (ref 12–46)
Monocytes Absolute: 0.3 10*3/uL (ref 0.1–1.0)
Monocytes Relative: 7 % (ref 3–12)
Neutro Abs: 2.1 10*3/uL (ref 1.7–7.7)
Neutrophils Relative %: 54 % (ref 43–77)

## 2010-09-08 LAB — VITAMIN B12: Vitamin B-12: 365 pg/mL (ref 211–911)

## 2010-09-08 LAB — BASIC METABOLIC PANEL
BUN: 8 mg/dL (ref 6–23)
CO2: 19 mEq/L (ref 19–32)
Calcium: 8.9 mg/dL (ref 8.4–10.5)
Creatinine, Ser: 1.01 mg/dL (ref 0.4–1.5)
GFR calc Af Amer: >60 mL/min (ref >60)
GFR calc non Af Amer: >60 mL/min (ref >60)
Glucose, Bld: 111 mg/dL — ABNORMAL HIGH (ref 70–99)
Sodium: 141 mEq/L (ref 135–145)

## 2010-09-08 LAB — FOLATE: Folate: 19.5 ng/mL

## 2010-09-08 LAB — GLUCOSE, CAPILLARY
Glucose-Capillary: 118 mg/dL — ABNORMAL HIGH (ref 70–99)
Glucose-Capillary: 168 mg/dL — ABNORMAL HIGH (ref 70–99)

## 2010-09-08 LAB — FERRITIN: Ferritin: 61 ng/mL (ref 22–322)

## 2010-09-08 MED ORDER — IOHEXOL 300 MG/ML  SOLN
100.0000 mL | Freq: Once | INTRAMUSCULAR | Status: AC | PRN
  Administered 2010-09-08: 100 mL via INTRAVENOUS

## 2010-09-09 DIAGNOSIS — K5289 Other specified noninfective gastroenteritis and colitis: Secondary | ICD-10-CM

## 2010-09-09 LAB — GLUCOSE, CAPILLARY: Glucose-Capillary: 103 mg/dL — ABNORMAL HIGH (ref 70–99)

## 2010-09-09 LAB — DIFFERENTIAL
Basophils Absolute: 0 10*3/uL (ref 0.0–0.1)
Eosinophils Absolute: 0.1 10*3/uL (ref 0.0–0.7)
Eosinophils Relative: 2 % (ref 0–5)
Lymphocytes Relative: 32 % (ref 12–46)
Monocytes Absolute: 0.3 10*3/uL (ref 0.1–1.0)

## 2010-09-09 LAB — CBC
HCT: 29.1 % — ABNORMAL LOW (ref 39.0–52.0)
MCHC: 32.3 g/dL (ref 30.0–36.0)
RDW: 15.6 % — ABNORMAL HIGH (ref 11.5–15.5)

## 2010-09-10 NOTE — Progress Notes (Signed)
  NAME:  Brad Wall, Brad Wall NO.:  [id number]  MEDICAL RECORD NO.:  [id number]           PATIENT TYPE:  I  LOCATION:  A316                          FACILITY:  [facility]  PHYSICIAN:  Mila Homer. [name], M.D.DATE OF BIRTH:  June 07, 1946  DATE OF PROCEDURE:  09/08/2010 DATE OF DISCHARGE:                                PROGRESS NOTE   SUBJECTIVE:  Feels better today.  His wife is with him in the room and tells me that his speech is still off occasionally but the confusion seems to have cleared.  OBJECTIVE:  Temperature 98.9, pulse 78, respiratory 20, blood pressure 132/68.  His color is good.  His speech appears to be normal but occasionally he has halting type speech.  Sentence structure was intact and sensorium appears to a good.  His lungs are clear throughout.  His heart has regular rhythm and rate about 80.  He still has some abdominal tenderness in epigastric region.  He has had diverticulitis and polyps in the past.  Wife tells me his last colonoscopy was within the last 2 TO 3 years which is, which was done in [place] when he lived there.  It has been years since he has had an EGD.  Today's white cell count is 4000 of which 92% are neutrophils, hemoglobin 9.6 and sugars being 118, 168.  O2 sats 96% room air.  ASSESSMENT: 1. Diarrhea, question etiology. 2. History of diverticula. 3. History of colonic polyps. 4. Benign essential hypertension. 5. Epigastric pain, question etiology. 6. Chronic depression. 7. Migraine headaches. 8. Benign prostatic hypertrophy. 9. Dehydration, cleared.  PLAN:  He is due for abdominal and pelvic CT today with contrast.  He will continue IV fluids.  Recheck a CBC and BMP in the morning.  Anemia profile is also pending.  I discussed his case with the PA for [facility] gastroenterology.  GI service will see him today given the persistent problems and persistent abdominal pain.  He may need an EGD for this evaluation.  His  diarrhea really has cleared since yesterday and he really has not had any in almost a day now.     Mila Homer. [name], M.D.     SDK/MEDQ  D:  09/08/2010  T:  09/08/2010  Job:  [id number]  Electronically Signed by John Giovanni M.D. on 09/10/2010 05:01:30 AM

## 2010-09-10 NOTE — Discharge Summary (Signed)
NAME:  Brad Wall, Brad Wall NO.:  [id number]  MEDICAL RECORD NO.:  [id number]           PATIENT TYPE:  I  LOCATION:  A316                          FACILITY:  [facility]  PHYSICIAN:  Mila Homer. [name], M.D.DATE OF BIRTH:  06/30/1945  DATE OF ADMISSION:  09/07/2010 DATE OF DISCHARGE:  03/14/2012LH                              DISCHARGE SUMMARY   This 65 year old was admitted to the hospital with severe dehydration and anemia.  He was also confused initially.  He had a benign 3-day hospitalization extending from September 07, 2010 to September 09, 2010.  Vital signs remained stable once he was rehydrated.  He did have some problem with orthostatic changes in his blood pressure, however, initially.  His admission white cell count was 4400, of which 51% were neutrophils, and his platelet count was 97,000.  His hemoglobin was 10.3.  Recheck CBC showed the hemoglobin had dropped to 9.6 and the platelet count was 92,000.  On the day of discharge hemoglobin was 9.4, platelet count 83,000.  His admission CMP showed a bicarb of 18, a glucose of 105, alk phos 124, SGOT 41.  Recheck BMP the following day showed a chloride of 116, glucose 111.  His UA was negative.  His anemia panel showed an iron level of 43, lower limits of normal, and a percent saturation of 12% (normal 20-55%).  His vitamin B12 level was 365.  He had an abdominal pelvic CT with contrast which showed some cecal and proximal ascending colonic wall thickening and associated pericolonic inflammatory changes and free fluid felt to be compatible with a focal colitis.  He had splenomegaly and sigmoid diverticula and nonobstructive right renal calculi.  He was admitted to the hospital.  He initially received 3-1/2 liters of IV fluid in the emergency department and then was put on IV normal saline at 100 mL/hour.  He was given Protonix 40 mg IV q.24 h., topiramate 50 mg 1 in the a.m. and 2 in the p.m., lorazepam 1 mg  t.i.d. p.r.n. anxiety, citalopram 40 mg daily, nortriptyline 25 mg 2 capsules q.h.s., cyclobenzaprine 10 mg t.i.d., hydrocodone/APAP 10/325 b.i.d. p.r.n., and NovoLog sliding-scale insulin.  A Clostridium difficile antigen level was ordered as was culture and sensitivity on his bowel movements but once he was admitted to the hospital he had no further BMs.  By his second day he was up in a chair and he was able to ambulate with assistance but he was still fairly weak and even his third day he felt a little dizzy standing up, so it was recommended that when he went home that he would only stand with assistance and use a walker at home until he recovered.  He was seen by [facility] while in the hospital.  FINAL DISCHARGE DIAGNOSES: 1. Severe dehydration. 2. Confusion secondary to dehydration and illness. 3. Probable iron-deficiency anemia. 4. Thrombocytopenia, possibly related to his underlying probable viral     process. 5. Splenomegaly, question etiology. 6. Chronic migraine headaches. 7. Reflux esophagitis. 8. Chronic depression. 9. Anxiety. 10.Benign prostatic hypertrophy. 11.Hypercholesterolemia.  The patient, his wife and I discussed  all of this.  It is possible he might have had a small gastrointestinal bleed, however, since there was no further diarrhea and his abdominal pain was better, this was not investigated at this time.  If he has any further problems, this could done outpatient, however.  He will be followed up in my office in 1 week.  DISCHARGE MEDICATIONS:  Include lorazepam 1 mg t.i.d. p.r.n. anxiety, aspirin 325 mg daily, citalopram 40 mg daily, Fioricet as needed for headache, Flexeril 10 mg t.i.d., Flomax 0.4 mg daily, gemfibrozil 600 mg b.i.d., metformin 500 mg b.i.d., metoclopramide 5 mg a.c. and h.s., Niaspan SR 500 mg daily, hydrocodone/APAP 10/325 q.i.d. as needed for pain, nortriptyline 25 mg 3 q.h.s., omeprazole 20 mg 2 daily,  atenolol 50 mg b.i.d., topiramate 50 mg 1 the morning and 2 in the afternoon, and simvastatin 10 mg daily.  I wrote him a prescription for nortriptyline 25 mg (number 90 with 11 refills).  When he is seen back in the office, will recheck his hemoglobin and hematocrit .     Mila Homer. [name], M.D.     SDK/MEDQ  D:  09/09/2010  T:  09/09/2010  Job:  [id number]  Electronically Signed by John Giovanni M.D. on 09/10/2010 05:01:12 AM

## 2010-09-10 NOTE — H&P (Signed)
NAME:  DIETRICK, BARRIS NO.:  [id number]  MEDICAL RECORD NO.:  [id number]           PATIENT TYPE:  I  LOCATION:  A316                          FACILITY:  [facility]  PHYSICIAN:  Mila Homer. [name], M.D.DATE OF BIRTH:  06/14/1946  DATE OF ADMISSION:  09/07/2010 DATE OF DISCHARGE:  LH                             HISTORY & PHYSICAL   HISTORY:  This 65 year old man presented to the emergency room at [facility] this afternoon due to profuse diarrhea, which had gone on for 5 days but in addition confusion, which he developed at home today.  His wife, who has been married to him for over 40 years, said this is the sickest she has ever seen him and she has never seen him this confused.  He was disoriented.  He has been having some chronic upper abdominal pain for several months and was due for an outpatient CT scan of the abdomen and pelvis tomorrow.  MEDICAL PROBLEMS:  Medical problems have included hypertension, diabetes, hypercholesterolemia, migraine headache, chronic pain. He does have a history of a cholecystectomy.  Recently he broke a tooth off and needed Biaxin 500 mg daily b.i.d. for some infection around that tooth. He just finished that recently.  With this particular episode, he developed nausea and vomiting about 5 days ago and within a day or two this has cleared.  He then developed diarrhea but in addition several days ago he developed a temperature to 101.  CURRENT MEDICATIONS:  Atenolol 50 mg b.i.d., tamsulosin 0.4 mg daily, simvastatin 10 mg daily, Niaspan extended release 500 mg daily, topiramate 50 mg 1 tablet in the a.m. and 2 tablets in the p.m., metoclopramide 5 mg a.c. and h.s. lorazepam 1 mg t.i.d., aspirin 325 mg daily, metformin 500 mg b.i.d., citalopram 40 mg daily, nortriptyline 25 mg two to three caps q.h.s., cyclobenzaprine 10 mg t.i.d., gemfibrozil 600 mg b.i.d., hydrocodone/APAP-10/325 q.i.d. for pain, omeprazole 20  mg two tablets daily, Fioricet 50/325/40 p.r.n. headache, naproxen 500 mg b.i.d., Humulin R by sliding scale.  SOCIAL HISTORY:  He lives with his wife.  His son lives nearby.  ADMISSION EXAMINATION:  GENERAL:  A pleasant middle-aged man.  At the time I saw him he was feeling a good deal better, after having been in the emergency room over 8 hours.  His color was good.  His speech was normal and his sensorium appeared to be intact.  His wife, who is with him, acknowledged that he was much improved.  At this point he already had already had 3.5 liter of IV fluids.  The decision was made for admission given that he still had postural changes and his systolic blood pressure was in the mid 80s despite the IV fluids. ENT:  Mucous membranes are moist. SKIN:  Turgor was normal. LYMPHATIC:  There is no axillary, supraclavicular, or anterior cervical adenopathy. HEART:  Regular rhythm, rate of about 80. LUNGS:  Clear throughout. ABDOMEN:  The abdomen was soft without organomegaly or mass but there is tenderness in the upper abdomen on deep palpation. EXTREMITIES:  There is no edema of the  ankles.  LABORATORY DATA:  His admission white cell count was 4400, of which 51% were neutrophils, 37% lymphocytes.  His hemoglobin was 10.3 and platelet count 97,000.  CMP showed a bicarb of 18, glucose 105 with a BUN of 11, creatinine 1.13 and alk phos slightly elevated at 124 and SGOT of 41. His admission UA showed specific gravity 1.010, pH 5.5.  ADMISSION DIAGNOSES: 1. Nausea, vomiting and diarrhea along with fever which may represent     a viral syndrome or infection with Clostridium difficile. 2. Dehydration. 3. Abdominal pain of 2 months' duration. 4. Hypercholesterolemia. 5. Benign prostatic hypertrophy. 6. Reflux esophagitis. 7. Depression. 8. Anxiety. 9. Migraine headache.  PLAN:  Discussed this case with the ER physician as well as the nursing staff.  I have talked to the patient and his  wife.  He will be admitted fully given the constellation of symptoms and signs.  He will be on IV normal saline 100 mL/hour and will continue him on sliding scale insulin, nortriptyline 25 mg two q.h.s., lorazepam 1 mg t.i.d., and topiramate 50 mg one in the a.m. and two in the p.m. him. I am holding his other medications. Tomorrow morning we will recheck a CBC and a BMP. He will have his abdominal and pelvic CT scan at that time.  I have put him on IV Protonix 40 mg at this point.     Mila Homer. [name], M.D.     SDK/MEDQ  D:  09/07/2010  T:  09/08/2010  Job:  [id number]  Electronically Signed by John Giovanni M.D. on 09/10/2010 04:59:27 AM

## 2010-09-14 LAB — URINALYSIS, ROUTINE W REFLEX MICROSCOPIC
Bilirubin Urine: NEGATIVE
Hgb urine dipstick: NEGATIVE
Protein, ur: NEGATIVE mg/dL
Specific Gravity, Urine: <1.005 — ABNORMAL LOW (ref 1.005–1.030)
Urobilinogen, UA: 0.2 mg/dL (ref 0.0–1.0)

## 2010-09-14 LAB — BASIC METABOLIC PANEL
Calcium: 9.2 mg/dL (ref 8.4–10.5)
GFR calc Af Amer: >60 mL/min (ref >60)
GFR calc non Af Amer: >60 mL/min (ref >60)
Sodium: 138 mEq/L (ref 135–145)

## 2010-09-14 LAB — DIFFERENTIAL
Basophils Absolute: 0 10*3/uL (ref 0.0–0.1)
Basophils Absolute: 0 10*3/uL (ref 0.0–0.1)
Basophils Absolute: 0 10*3/uL (ref 0.0–0.1)
Basophils Relative: 0 % (ref 0–1)
Eosinophils Absolute: 0.1 10*3/uL (ref 0.0–0.7)
Eosinophils Absolute: 0.1 10*3/uL (ref 0.0–0.7)
Eosinophils Relative: 2 % (ref 0–5)
Lymphocytes Relative: 27 % (ref 12–46)
Monocytes Absolute: 0.3 10*3/uL (ref 0.1–1.0)
Monocytes Relative: 7 % (ref 3–12)
Neutro Abs: 2.9 10*3/uL (ref 1.7–7.7)
Neutro Abs: 3.1 10*3/uL (ref 1.7–7.7)
Neutrophils Relative %: 66 % (ref 43–77)

## 2010-09-14 LAB — CARDIAC PANEL(CRET KIN+CKTOT+MB+TROPI)
CK, MB: 0.6 ng/mL (ref 0.3–4.0)
Relative Index: INVALID (ref 0.0–2.5)
Relative Index: INVALID (ref 0.0–2.5)
Total CK: 34 U/L (ref 7–232)
Total CK: 34 U/L (ref 7–232)
Total CK: 36 U/L (ref 7–232)

## 2010-09-14 LAB — COMPREHENSIVE METABOLIC PANEL
ALT: 34 U/L (ref 0–53)
AST: 41 U/L — ABNORMAL HIGH (ref 0–37)
BUN: 9 mg/dL (ref 6–23)
CO2: 22 mEq/L (ref 19–32)
CO2: 24 mEq/L (ref 19–32)
Chloride: 105 mEq/L (ref 96–112)
Chloride: 108 mEq/L (ref 96–112)
Creatinine, Ser: 1.04 mg/dL (ref 0.4–1.5)
GFR calc Af Amer: >60 mL/min (ref >60)
GFR calc non Af Amer: >60 mL/min (ref >60)
GFR calc non Af Amer: >60 mL/min (ref >60)
Glucose, Bld: 121 mg/dL — ABNORMAL HIGH (ref 70–99)
Sodium: 136 mEq/L (ref 135–145)
Total Bilirubin: 0.3 mg/dL (ref 0.3–1.2)
Total Bilirubin: 0.5 mg/dL (ref 0.3–1.2)

## 2010-09-14 LAB — CBC
HCT: 33.8 % — ABNORMAL LOW (ref 39.0–52.0)
Hemoglobin: 11.4 g/dL — ABNORMAL LOW (ref 13.0–17.0)
Hemoglobin: 11.9 g/dL — ABNORMAL LOW (ref 13.0–17.0)
MCV: 91.7 fL (ref 78.0–100.0)
Platelets: 78 10*3/uL — ABNORMAL LOW (ref 150–400)
RBC: 3.69 MIL/uL — ABNORMAL LOW (ref 4.22–5.81)
RBC: 3.82 MIL/uL — ABNORMAL LOW (ref 4.22–5.81)
RBC: 3.94 MIL/uL — ABNORMAL LOW (ref 4.22–5.81)
RDW: 15.4 % (ref 11.5–15.5)
WBC: 3.9 10*3/uL — ABNORMAL LOW (ref 4.0–10.5)
WBC: 4.7 10*3/uL (ref 4.0–10.5)

## 2010-09-14 LAB — GLUCOSE, CAPILLARY
Glucose-Capillary: 141 mg/dL — ABNORMAL HIGH (ref 70–99)
Glucose-Capillary: 144 mg/dL — ABNORMAL HIGH (ref 70–99)

## 2010-09-14 LAB — LIPID PANEL
HDL: 29 mg/dL — ABNORMAL LOW (ref >39)
LDL Cholesterol: 34 mg/dL (ref 0–99)
Total CHOL/HDL Ratio: 3.9 RATIO
VLDL: 51 mg/dL — ABNORMAL HIGH (ref 0–40)

## 2010-09-14 LAB — RHEUMATOID FACTOR: Rhuematoid fact SerPl-aCnc: <20 IU/mL (ref 0–20)

## 2010-09-14 LAB — URINE CULTURE: Culture: NO GROWTH

## 2010-09-14 LAB — VITAMIN D 25 HYDROXY (VIT D DEFICIENCY, FRACTURES): Vit D, 25-Hydroxy: 20 ng/mL — ABNORMAL LOW (ref 30–89)

## 2010-09-14 LAB — SEDIMENTATION RATE: Sed Rate: 8 mm/hr (ref 0–16)

## 2010-09-14 LAB — POCT CARDIAC MARKERS: Troponin i, poc: <0.05 ng/mL (ref 0.00–0.09)

## 2010-09-14 LAB — CORTISOL: Cortisol, Plasma: 14.1 ug/dL

## 2010-09-14 LAB — PROTIME-INR: Prothrombin Time: 14.8 seconds (ref 11.6–15.2)

## 2010-09-14 LAB — URIC ACID: Uric Acid, Serum: 4.9 mg/dL (ref 4.0–7.8)

## 2010-09-14 LAB — TSH: TSH: 0.924 u[IU]/mL (ref 0.350–4.500)

## 2010-09-14 LAB — MAGNESIUM: Magnesium: 2.1 mg/dL (ref 1.5–2.5)

## 2010-10-18 NOTE — Consult Note (Signed)
NAME:  Brad Wall, Brad Wall NO.:  [id number]  MEDICAL RECORD NO.:  [id number]           PATIENT TYPE:  I  LOCATION:  A316                          FACILITY:  [facility]  PHYSICIAN:  Lorenza Burton, N.P.    DATE OF BIRTH:  Sep 11, 1945  DATE OF CONSULTATION: DATE OF DISCHARGE:                                CONSULTATION   REQUESTING PHYSICIAN:  Mila Homer. [name], MD  PHYSICIAN SIGNING NOTEJonathon Bellows, MD  REASON FOR CONSULTATION:  Abdominal pain, anemia, and diarrhea.  HISTORY OF PRESENT ILLNESS:  Brad Wall is a 64-year Caucasian male who was admitted to [facility] for dehydration and diarrhea and confusion.  He tells me he has had a 10-day history of diarrhea.  It did resolve yesterday.  He also has generalized abdominal pain with this and found to be anemic.  He describes the abdominal pain as mostly his upper abdomen.  He tells me he had anywhere from 10-15 dark black bowel movement prior to yesterday per day.  He denies any mucus or bright red blood in his stools.  He describes the upper abdominal pain as 10/10 at worst.  It is 0/10 now.  He has had nausea and vomiting in the first couple days with the onset of this.  He also had fever around 101.  He denied any hematemesis.  He denies any ill contacts.  He just finished Biaxin for dental abscess yesterday.  He has had very bad heartburn and indigestion the last 2 weeks as well.  He has been on b.i.d. Prilosec for years with history of chronic GERD.  He tells me he is trying to lose weight and has lost a couple of pounds and eating less.  His hemoglobin was found to be 9.6, hematocrit 29.9, platelets 92,000.  His alkaline phosphatase is slightly elevated at 124, AST at 41, otherwise normal LFTs.  PAST MEDICAL AND SURGICAL HISTORY:  He tells me he has had 2 CVAs, the last one being around 3 years ago.  He has history of hypertension, diabetes mellitus, chronic GERD, hyperlipidemia, migraine  headaches, chronic back pain which [facility] [place]. Currently he is by Dr. [name].  He had a cholecystectomy about 2 years ago for stones.  He has had diverticulosis, multiple adenomatous polyps. He tells me his last colonoscopy was about 2 years ago by Dr. Tenny Craw in [place], [place].  He has had 2 EGDs which he tells me were normal.  MEDICATIONS PRIOR TO ADMISSION: 1. Atenolol 50 mg b.i.d. 2. Citalopram 40 mg daily. 3. Tamsulosin 0.4 mg daily. 4. Nortriptyline 25 mg 2-3 at bedtime. 5. Cyclobenzaprine 10 mg t.i.d. 6. Gemfibrozil 600 mg b.i.d. 7. Hydrocodone/APAP 10/325 mg q.i.d. p.r.n. pain. 8. Omeprazole 20 mg b.i.d. 9. Fioricet 50/325/40 mg p.r.n. headache. 10.Naproxen 500 mg b.i.d. 11.Humulin R sliding scale insulin. 12.Topiramate 50 mg in the a.m., 100 mg in the p.m. 13.Reglan 5 mg before meals and at bedtime. 14.Lorazepam 1 mg t.i.d. 15.Aspirin 325 mg daily. 16.Metformin 500 mg b.i.d. 17.Atenolol 50 mg b.i.d.  ALLERGIES:  PENICILLIN.  FAMILY  HISTORY:  There is no known family history of carcinoma of the liver or chronic GI problems.  SOCIAL HISTORY:  Brad Wall has been married for 42 years.  He recently moved to [place] from [place] April of 2011.  His son lives here as well.  He has 5 healthy children.  He denies any tobacco, alcohol, or drug use.  He retired 5 years ago and was previously a Production designer, theatre/television/film but cannot tell me what type of business.  REVIEW OF SYSTEMS:  See HPI.  He is complaining of some memory problems and confusion, otherwise negative review of systems.  PHYSICAL EXAMINATION:  VITAL SIGNS:  Temperature 98.9, pulse 78, respirations 20, blood pressure 132/68, O2 sat 96% on room air, weight 76.657 kg, height 68 inches. GENERAL:  He is an alert, oriented, pleasant, cooperative Caucasian male in no acute distress. HEENT:  Sclerae clear and nonicteric.  Conjunctivae pale.  Oropharynx pink and moist without  any lesions. NECK:  Supple without thyromegaly. CHEST:  Heart regular rate and rhythm.  Normal S1, S2.  No murmurs, clicks, rubs, or gallops. LUNGS:  Clear to auscultation bilaterally. ABDOMEN:  Protuberant.  Positive bowel sounds x4.  No bruits auscultated.  Soft, nontender, nondistended.  Unable to palpate hepatosplenomegaly or mass.  No rebound tenderness or guarding.  Exam is limited given the patient's body habitus. EXTREMITIES:  Without edema bilaterally. SKIN:  Pale, warm, and dry.  LABORATORY STUDIES:  White blood cell count 4, calcium 8.9, sodium 141, potassium 3.9, chloride 116, CO2 19, BUN 8, creatinine 1.01 and glucose 111, total bilirubin 0.5, ALT 32, total protein 6.4, albumin 3.5.  His INR was normal.  Urinalysis was negative.  IMPRESSION:  Brad Wall is a 65 year old Caucasian male with a 10-day history of diarrhea, abdominal pain, and anemia.  He has history of chronic back pain, gastroesophageal reflux disease, adenomatous polyps, and diverticulosis.  His hemoglobin has dropped to 9.6, platelets 92,000.  He has a mildly elevated alkaline phosphatase and AST.  There is no known history of liver disease.  His diarrhea has resolved at present.  He had very dark black stools, possible melena, on aspirin and naproxen b.i.d.  I would question history of peptic ulcer disease or GI bleed in the proximal GI tract.  Also concerning is recent antibiotic use and diarrhea and we need to rule out C. difficile.  It is possible that diarrhea may have been fully related to Biaxin versus an infectious process.  PLAN: 1. Obtain colonoscopy, EGD, and biopsy report from Dr. Tenny Craw in     [place] to determine timing of the next colonoscopy. 2. Agree with IV fluids and PPI. 3. We will follow up anemia panel. 4. We will follow up stool studies including C diff. 5. We will follow up CT as ordered and be sure that he gets IV and     oral contrast. 6. CBC in the morning. 7.  Hemoccult stools x3. 8. Further recommendations to follow.  We would like to thank Dr. [name] for allowing [place] to participate in the care of Brad Wall.     Lorenza Burton, N.P.     KJ/MEDQ  D:  09/08/2010  T:  09/08/2010  Job:  [id number]  cc:   Mila Homer. [name], M.D. Fax: [phone]  Electronically Signed by Lorenza Burton N.P. on 09/10/2010 08:25:59 AM Electronically Signed by Lorrin Goodell M.D. on 10/18/2010 02:07:58 PM

## 2010-12-21 ENCOUNTER — Emergency Department (HOSPITAL_COMMUNITY): Payer: Medicare Other

## 2010-12-21 ENCOUNTER — Observation Stay (HOSPITAL_COMMUNITY)
Admission: EM | Admit: 2010-12-21 | Discharge: 2010-12-23 | Disposition: A | Discharge status: Home / Self Care | Payer: Medicare Other | Attending: Internal Medicine | Admitting: Internal Medicine

## 2010-12-21 ENCOUNTER — Other Ambulatory Visit (HOSPITAL_COMMUNITY): Payer: Self-pay | Admitting: Family Medicine

## 2010-12-21 DIAGNOSIS — F3289 Other specified depressive episodes: Secondary | ICD-10-CM | POA: Insufficient documentation

## 2010-12-21 DIAGNOSIS — Z794 Long term (current) use of insulin: Secondary | ICD-10-CM | POA: Insufficient documentation

## 2010-12-21 DIAGNOSIS — G9332 Myalgic encephalomyelitis/chronic fatigue syndrome: Secondary | ICD-10-CM | POA: Insufficient documentation

## 2010-12-21 DIAGNOSIS — R161 Splenomegaly, not elsewhere classified: Principal | ICD-10-CM | POA: Insufficient documentation

## 2010-12-21 DIAGNOSIS — R5382 Chronic fatigue, unspecified: Secondary | ICD-10-CM | POA: Insufficient documentation

## 2010-12-21 DIAGNOSIS — R160 Hepatomegaly, not elsewhere classified: Secondary | ICD-10-CM | POA: Insufficient documentation

## 2010-12-21 DIAGNOSIS — M545 Low back pain, unspecified: Secondary | ICD-10-CM | POA: Insufficient documentation

## 2010-12-21 DIAGNOSIS — F329 Major depressive disorder, single episode, unspecified: Secondary | ICD-10-CM | POA: Insufficient documentation

## 2010-12-21 DIAGNOSIS — E7800 Pure hypercholesterolemia, unspecified: Secondary | ICD-10-CM | POA: Insufficient documentation

## 2010-12-21 DIAGNOSIS — Z8673 Personal history of transient ischemic attack (TIA), and cerebral infarction without residual deficits: Secondary | ICD-10-CM | POA: Insufficient documentation

## 2010-12-21 DIAGNOSIS — D61818 Other pancytopenia: Secondary | ICD-10-CM | POA: Insufficient documentation

## 2010-12-21 DIAGNOSIS — E119 Type 2 diabetes mellitus without complications: Secondary | ICD-10-CM | POA: Insufficient documentation

## 2010-12-21 DIAGNOSIS — G40802 Other epilepsy, not intractable, without status epilepticus: Secondary | ICD-10-CM | POA: Insufficient documentation

## 2010-12-21 DIAGNOSIS — M041 Periodic fever syndromes: Secondary | ICD-10-CM | POA: Insufficient documentation

## 2010-12-21 DIAGNOSIS — I10 Essential (primary) hypertension: Secondary | ICD-10-CM | POA: Insufficient documentation

## 2010-12-21 DIAGNOSIS — G8929 Other chronic pain: Secondary | ICD-10-CM | POA: Insufficient documentation

## 2010-12-21 DIAGNOSIS — Z79899 Other long term (current) drug therapy: Secondary | ICD-10-CM | POA: Insufficient documentation

## 2010-12-21 LAB — BASIC METABOLIC PANEL
BUN: 14 mg/dL (ref 6–23)
Chloride: 104 mEq/L (ref 96–112)
Creatinine, Ser: 0.98 mg/dL (ref 0.50–1.35)
GFR calc Af Amer: >60 mL/min (ref >60)
Glucose, Bld: 98 mg/dL (ref 70–99)
Potassium: 4.1 mEq/L (ref 3.5–5.1)

## 2010-12-21 LAB — CBC
MCHC: 34.4 g/dL (ref 30.0–36.0)
MCV: 93.6 fL (ref 78.0–100.0)
Platelets: 85 10*3/uL — ABNORMAL LOW (ref 150–400)
RDW: 14.6 % (ref 11.5–15.5)
WBC: 4.7 10*3/uL (ref 4.0–10.5)

## 2010-12-21 LAB — CK TOTAL AND CKMB (NOT AT ARMC): Relative Index: INVALID (ref 0.0–2.5)

## 2010-12-21 LAB — DIFFERENTIAL
Basophils Absolute: 0 10*3/uL (ref 0.0–0.1)
Eosinophils Absolute: 0.1 10*3/uL (ref 0.0–0.7)
Eosinophils Relative: 2 % (ref 0–5)
Lymphs Abs: 1.7 10*3/uL (ref 0.7–4.0)
Monocytes Absolute: 0.4 10*3/uL (ref 0.1–1.0)

## 2010-12-22 ENCOUNTER — Observation Stay (HOSPITAL_COMMUNITY): Payer: Medicare Other

## 2010-12-22 LAB — LIPID PANEL
Cholesterol: 126 mg/dL (ref 0–200)
LDL Cholesterol: 30 mg/dL (ref 0–99)
Total CHOL/HDL Ratio: 5 RATIO
Triglycerides: 355 mg/dL — ABNORMAL HIGH (ref <150)
VLDL: 71 mg/dL — ABNORMAL HIGH (ref 0–40)

## 2010-12-22 LAB — CBC
HCT: 36.1 % — ABNORMAL LOW (ref 39.0–52.0)
Hemoglobin: 12.1 g/dL — ABNORMAL LOW (ref 13.0–17.0)
MCH: 31.3 pg (ref 26.0–34.0)
MCV: 93.5 fL (ref 78.0–100.0)
Platelets: 83 10*3/uL — ABNORMAL LOW (ref 150–400)
RBC: 3.86 MIL/uL — ABNORMAL LOW (ref 4.22–5.81)
WBC: 3.8 10*3/uL — ABNORMAL LOW (ref 4.0–10.5)

## 2010-12-22 LAB — COMPREHENSIVE METABOLIC PANEL
AST: 47 U/L — ABNORMAL HIGH (ref 0–37)
Albumin: 3.9 g/dL (ref 3.5–5.2)
Alkaline Phosphatase: 128 U/L — ABNORMAL HIGH (ref 39–117)
BUN: 16 mg/dL (ref 6–23)
Chloride: 104 mEq/L (ref 96–112)
Potassium: 3.9 mEq/L (ref 3.5–5.1)
Sodium: 137 mEq/L (ref 135–145)
Total Bilirubin: 0.3 mg/dL (ref 0.3–1.2)
Total Protein: 6.9 g/dL (ref 6.0–8.3)

## 2010-12-22 LAB — GLUCOSE, CAPILLARY
Glucose-Capillary: 124 mg/dL — ABNORMAL HIGH (ref 70–99)
Glucose-Capillary: 126 mg/dL — ABNORMAL HIGH (ref 70–99)
Glucose-Capillary: 134 mg/dL — ABNORMAL HIGH (ref 70–99)
Glucose-Capillary: 210 mg/dL — ABNORMAL HIGH (ref 70–99)

## 2010-12-22 LAB — CARDIAC PANEL(CRET KIN+CKTOT+MB+TROPI)
CK, MB: 1.5 ng/mL (ref 0.3–4.0)
Relative Index: INVALID (ref 0.0–2.5)
Relative Index: INVALID (ref 0.0–2.5)
Total CK: 39 U/L (ref 7–232)
Troponin I: <0.3 ng/mL (ref <0.30)

## 2010-12-22 LAB — POCT I-STAT 3, ART BLOOD GAS (G3+)
Acid-base deficit: 7 mmol/L — ABNORMAL HIGH (ref 0.0–2.0)
Bicarbonate: 17.7 mEq/L — ABNORMAL LOW (ref 20.0–24.0)
O2 Saturation: 95 %
TCO2: 19 mmol/L (ref 0–100)
pCO2 arterial: 31.9 mmHg — ABNORMAL LOW (ref 35.0–45.0)
pO2, Arterial: 79 mmHg — ABNORMAL LOW (ref 80.0–100.0)

## 2010-12-22 LAB — LIPASE, BLOOD: Lipase: 28 U/L (ref 11–59)

## 2010-12-23 ENCOUNTER — Observation Stay (HOSPITAL_COMMUNITY): Payer: Medicare Other

## 2010-12-25 ENCOUNTER — Ambulatory Visit (HOSPITAL_COMMUNITY): Admission: RE | Admit: 2010-12-25 | Payer: Medicare Other | Source: Ambulatory Visit

## 2010-12-25 NOTE — H&P (Signed)
NAME:  Brad Wall, MCNAB NO.:  [id number]  MEDICAL RECORD NO.:  [id number]  LOCATION:  [facility]                         FACILITY:  [facility]  PHYSICIAN:  Eduard Clos, MDDATE OF BIRTH:  [birth year]  DATE OF ADMISSION:  12/21/2010 DATE OF DISCHARGE:                             HISTORY & PHYSICAL   PRIMARY CARE PHYSICIAN:  Mila Homer. [name], MD  CHIEF COMPLAINT:  Chest pain.  HISTORY OF PRESENT ILLNESS:  This is a 65 year old male with known history of hypertension, diabetes mellitus type 2, familial Mediterranean fever, off medications for last 1 year had presently complained of chest pain.  The patient was at home when he suddenly developed chest pain on the right side with radiation to back.  It was pressure-like lasting for less than half an hour.  At this time, he is chest pain-free.  The patient had a chest x-ray, an EKG, and cardiac enzymes which were negative.  The patient will be admitted for further observation.  In addition, the patient's family has been saying that the patient has been getting very weak and tired and fatigued over the last 1 week.  His primary care is originally planning to do an MRI of the brain on Friday.  The patient did not lose consciousness.  Did not have any headache or visual symptoms.  Did not have any fever, chills, cough, or phlegm.  Did not have any seizure-like activities or any focal deficits.  PAST MEDICAL HISTORY: 1. History of familial Mediterrean fever. 2. Chronic fatigue syndrome. 3. History of hypertension. 4. Diabetes mellitus type 2. 5. Depression. 6. Chronic pain, low-back. 7. History of chronic complex migraines. 8. Previous history of TIAs.  PAST SURGICAL HISTORY:  Cholecystectomy.  MEDICATIONS:  Medications per admission which has to be verified includes atenolol, citalopram, cyclobenzaprine, Fioricet, gemfibrozil, Humulin U, hydrocodone/APAP, ketorolac, lorazepam, metformin, metoclopramide,  naproxen, Niaspan, nortriptyline, omeprazole, simvastatin, and tamsulosin.  ALLERGIES:  PENICILLIN.  SOCIAL HISTORY:  The patient is married, lives with his wife.  Denies smoking cigarettes, drinking alcohol, or using illegal drugs.  REVIEW OF SYSTEMS:  As present in history of present illness, nothing else significant.  FAMILY HISTORY:  Significant for colon cancer in both parents.  I did discuss about colonoscopy and he states that he has had multiple colonoscopies earlier.  PHYSICAL EXAMINATION:  GENERAL:  The patient examined at bedside not in acute distress. VITAL SIGNS:  Blood pressure is 105/60, pulse 70 per minute, temperature 98.5, respirations 18 per minute, and O2 sat 99%. HEENT:  Anicteric.  No pallor.  No discharge from ears, eyes, nose, and mouth. CHEST:  Bilateral air entry present.  No rhonchi.  No crepitation. HEART:  S1-S2 heard. ABDOMEN:  Soft, nontender.  Bowel sounds heard. CNS:  The patient is alert, awake, and oriented to time, place, and person.  Moves upper and lower extremities 5/5. EXTREMTIES:  Peripheral pulses felt.  No edema.  LABORATORY DATA:  EKG shows normal sinus rhythm with heart rate around 76 beats per minute and nonspecific ST-T wave changes.  Chest x-ray shows no evidence of active pulmonary disease.  CBC:  WBC is 4.7, hemoglobin is 12.5, hematocrit is 36.3, and  platelets 85.  D-dimer is 0.32.  Basic metabolic panel:  Sodium 137, potassium 4.1, chloride 104, carbon dioxide 20, anion gap is 13, BUN 14, creatinine 0.98, and calcium 9.4.  CK 24, MB 1.3, and troponin-I less than 0.3.  ASSESSMENT: 1. Chest pain, to rule out acute coronary syndrome. 2. Generalized weakness, fatigue and forgetfulness. 3. History of familial Mediterrean fever. 4. History of complex migraine. 5. History of hypertension. 6. History of diabetes mellitus type 2. 7. History of hyperlipidemia.  PLAN: 1. At this time, we will admit the patient to telemetry. 2.  For his chest pain at this time, D-dimer is negative.  We will     cycle cardiac markers and get 2-D echo.  The patient will be on his     regular dose of aspirin 81 mg daily as the patient does have some     thrombocytopenia which is chronic. 3. For his fatigue and generalized weakness, at this time we will     check a cortisol level and TSH level.  The patient was scheduled to     have MRI of the brain which I had ordered over a year and we will     also check a CBG.  At this time for his fatigue, there is no work     what the cause could be but in addition the patient also has     forgetfulness for which I am ordering RPR, B12, and folic acid     levels.  Based on these things, we will further plan.     Eduard Clos, MD    ANK/MEDQ  D:  12/22/2010  T:  12/22/2010  Job:  [id number]  cc:   Mila Homer. [name], M.D.  Electronically Signed by Midge Minium MD on 12/25/2010 12:24:13 AM

## 2010-12-26 NOTE — Discharge Summary (Signed)
NAMEMarland Kitchen  Brad, Wall NO.:  [id number]  MEDICAL RECORD NO.:  [id number]  LOCATION:  3714                         FACILITY:  [facility]  PHYSICIAN:  Brad Wall, M.D.       DATE OF BIRTH:  1946/03/31  DATE OF ADMISSION:  12/21/2010 DATE OF DISCHARGE:  12/23/2010                              DISCHARGE SUMMARY   PRIMARY CARE PHYSICIAN:  Brad Homer. [name], MD.  DISCHARGE DIAGNOSES: 1. Chest pain - highly unlikely cardiac in nature.  No signs of     unstable angina or myocardial infarction. 2. 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 Jena Wall in the hospital, September 08, 2010, and he can     be referred to Brad Wall also known as     [facility]. 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 4. Chronic fatigue syndrome. 5. Hypertension. 6. Diabetes mellitus type 2. 7. Depression. 8. Chronic low back pain. 9. Status post cholecystectomy.  DISCHARGE MEDICATIONS: 1. Colchicine 0.6 mg by mouth daily. 2. Ativan 1 tablet by mouth three times a day as needed for anxiety. 3. Celexa 40 mg half a tablet daily. 4. Iron 325 mg daily. 5. Fioricet 50/325 one every 4 hours as needed for headaches. 6. Flexeril 10 mg three times a day as needed for back spasms. 7. Flomax 0.4 mg daily. 8. Humulin R 3-12 units four times a day as based on the sliding     scale. 9. Lopid 600 mg twice a day. 10.Metformin 500 mg twice a day. 11.Reglan 5 mg by mouth three times a day and at bedtime. 12.Niaspan 500 mg daily. 13.Norco 10/325 one tablet four times a day as needed for pain. 14.Nortriptyline 75 mg at bedtime. 15.Prilosec 40 mg daily. 16.Atenolol 50 mg twice a day. 17.Topamax 50 mg to take one tablet in  the morning, two tablets at     night. 18.Zocor 10 mg daily.  CONDITION ON DISCHARGE:  Brad Wall was discharged in good condition. His temperature is 98.2, heart is 64, respirations 18, Wall pressure 118/75, saturation 96% on room air.  He was referred for outpatient visit with [facility], Hematology consultation for splenomegaly and pancytopenia.  We will also recommend that the patient see his primary care physician Dr. John Wall and gets referral to [facility] for his his probable cirrhosis.  PROCEDURE DURING THIS ADMISSION: 1. The patient underwent an MRI of the brain, which was negative for     acute strokes. 2. Abdominal ultrasound showing diffuse hepatic steatosis,     hepatomegaly, consistent with cirrhosis, patent portal vein flow     splenomegaly, mild aortic atherosclerosis.  HISTORY AND PHYSICAL:  Refer to dictated H and P done by Brad Wall.  HOSPITAL COURSE: 1. Chest pain.  Brad Wall was admitted from the emergency room with     complaint of  chest pain.  He had cardiac enzymes which were within     normal limits x3.  He had no arrhythmias on monitoring in fact he     has no reason to suspect his chest pain is due to coronary     atherosclerosis.  I think the chest pain was referred due to a     splenomegaly and hepatomegaly. 2. Hepatosplenomegaly.  The etiology of this is a little bit unclear.     The patient says that he had already a liver biopsy done in     [place] and hoping Dr. Sudie Wall has records of that.     Clinically, the patient has signs of chronic pancytopenia with     white Wall cell count of 3.8, hemoglobin 12.9, platelet count of     83.  He has splenomegaly, which conceivable due to liver cirrhosis     versus he may have maybe a primary process on his own.  The patient     get a diagnosis of familial Mediterranean fever and if that is     correct diagnosis in this condition, if  the patient goes years     without treatment, he can have amyloid deposition in the liver and     spleen.  This could definitely explain his symptoms and the     findings on the ultrasound.  Again, the final results of the liver     biopsy from [place] is critical.  In any case, the plan of     approaching the patient is the same, and the plan involves     outpatient Hematology follow-up to see if he requires bone marrow     biopsy.  If he does not require that, he will follow up with     [facility] Wall for his liver disease.  In     the long run, I have advised Brad Wall to resume his colchicine     since it might be the only thing to help amyloid deposition to his     liver and spleen in the future.     Brad Wall, M.D.     SL/MEDQ  D:  12/25/2010  T:  12/25/2010  Job:  [id number]  cc:   Brad Wall, M.D. Brad Horns. Mariel Sleet, MD R. Roetta Sessions, MD FACP [facility]  Electronically Signed by Brad Wall M.D. on 12/26/2010 04:46:16 PM

## 2010-12-28 LAB — CULTURE, BLOOD (ROUTINE X 2)
Culture  Setup Time: 201206262340
Culture  Setup Time: 201206262341

## 2011-01-12 ENCOUNTER — Encounter (HOSPITAL_COMMUNITY): Payer: Medicare Other | Attending: Oncology | Admitting: Oncology

## 2011-01-12 ENCOUNTER — Encounter (HOSPITAL_COMMUNITY): Payer: Medicare Other | Admitting: Oncology

## 2011-01-12 ENCOUNTER — Encounter (HOSPITAL_COMMUNITY): Payer: Self-pay | Admitting: Oncology

## 2011-01-12 VITALS — BP 118/76 | HR 79 | Temp 98.5°F | Ht 68.0 in | Wt 167.0 lb

## 2011-01-12 DIAGNOSIS — K7689 Other specified diseases of liver: Secondary | ICD-10-CM | POA: Insufficient documentation

## 2011-01-12 DIAGNOSIS — Z8673 Personal history of transient ischemic attack (TIA), and cerebral infarction without residual deficits: Secondary | ICD-10-CM | POA: Insufficient documentation

## 2011-01-12 DIAGNOSIS — D696 Thrombocytopenia, unspecified: Secondary | ICD-10-CM

## 2011-01-12 DIAGNOSIS — I10 Essential (primary) hypertension: Secondary | ICD-10-CM | POA: Insufficient documentation

## 2011-01-12 DIAGNOSIS — M041 Periodic fever syndromes: Secondary | ICD-10-CM | POA: Insufficient documentation

## 2011-01-12 DIAGNOSIS — R161 Splenomegaly, not elsewhere classified: Secondary | ICD-10-CM | POA: Insufficient documentation

## 2011-01-12 DIAGNOSIS — E119 Type 2 diabetes mellitus without complications: Secondary | ICD-10-CM | POA: Insufficient documentation

## 2011-01-12 DIAGNOSIS — D649 Anemia, unspecified: Secondary | ICD-10-CM | POA: Insufficient documentation

## 2011-01-12 DIAGNOSIS — D6959 Other secondary thrombocytopenia: Secondary | ICD-10-CM | POA: Insufficient documentation

## 2011-01-12 NOTE — Patient Instructions (Signed)
[facility]  Discharge Instructions  RECOMMENDATIONS MADE BY THE CONSULTANT AND ANY TEST RESULTS WILL BE SENT TO YOUR REFERRING DOCTOR.   EXAM FINDINGS BY MD TODAY AND SIGNS AND SYMPTOMS TO REPORT TO CLINIC OR PRIMARY MD:  Discussed possibility of cirrhosis. Platelets are stable. It comes from a large spleen and may be secondary to your liver issues. MEDICATIONS PRESCRIBED: none  INSTRUCTIONS GIVEN AND DISCUSSED: Other  Return Thursday for labs and the collected 24 hr urine specimen to be returned  SPECIAL INSTRUCTIONS/FOLLOW-UP: Lab work Needed  Thursday   I acknowledge that I have been informed and understand all the instructions given to me and received a copy. I do not have any more questions at this time, but understand that I may call the Specialty Clinic at [facility] at [phone] during business hours should I have any further questions or need assistance in obtaining follow-up care.    __________________________________________  _____________  __________ Signature of Patient or Authorized Representative            Date                   Time    __________________________________________ Nurse's Signature

## 2011-01-12 NOTE — Progress Notes (Signed)
This office note has been dictated.
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
## 2011-01-14 ENCOUNTER — Encounter (HOSPITAL_BASED_OUTPATIENT_CLINIC_OR_DEPARTMENT_OTHER): Payer: Medicare Other

## 2011-01-14 ENCOUNTER — Other Ambulatory Visit (HOSPITAL_COMMUNITY): Payer: Medicare Other

## 2011-01-14 DIAGNOSIS — D696 Thrombocytopenia, unspecified: Secondary | ICD-10-CM

## 2011-01-14 LAB — COMPREHENSIVE METABOLIC PANEL
ALT: 46 U/L (ref 0–53)
Alkaline Phosphatase: 161 U/L — ABNORMAL HIGH (ref 39–117)
CO2: 21 mEq/L (ref 19–32)
GFR calc Af Amer: >60 mL/min (ref >60)
GFR calc non Af Amer: >60 mL/min (ref >60)
Glucose, Bld: 175 mg/dL — ABNORMAL HIGH (ref 70–99)
Potassium: 4.5 mEq/L (ref 3.5–5.1)
Sodium: 138 mEq/L (ref 135–145)

## 2011-01-14 LAB — CBC
HCT: 39.7 % (ref 39.0–52.0)
Hemoglobin: 13.2 g/dL (ref 13.0–17.0)
RBC: 4.11 MIL/uL — ABNORMAL LOW (ref 4.22–5.81)

## 2011-01-14 LAB — FOLATE: Folate: >20 ng/mL

## 2011-01-14 LAB — VITAMIN B12: Vitamin B-12: 395 pg/mL (ref 211–911)

## 2011-01-14 NOTE — Progress Notes (Signed)
Labs drawn today for  Cbc,vb12,folate,Iron and IBC, ferr,  MM panel ,cmp. Patient also collected protein eletro. 24 urine

## 2011-01-18 LAB — UIFE/LIGHT CHAINS/TP QN, 24-HR UR
Alpha 2, Urine: DETECTED — AB
Beta, Urine: DETECTED — AB
Free Kappa Lt Chains,Ur: 2.11 mg/dL (ref 0.14–2.42)
Free Lt Chn Excr Rate: 25.32 mg/d

## 2011-01-19 LAB — MULTIPLE MYELOMA PANEL, SERUM
Alpha-2-Globulin: 9.2 % (ref 7.1–11.8)
Beta Globulin: 6.4 % (ref 4.7–7.2)
Gamma Globulin: 12.2 % (ref 11.1–18.8)
M-Spike, %: NOT DETECTED g/dL

## 2011-01-21 ENCOUNTER — Other Ambulatory Visit: Payer: Self-pay | Admitting: Gastroenterology

## 2011-01-21 ENCOUNTER — Ambulatory Visit (INDEPENDENT_AMBULATORY_CARE_PROVIDER_SITE_OTHER): Payer: Medicare Other | Admitting: Gastroenterology

## 2011-01-21 ENCOUNTER — Encounter: Payer: Self-pay | Admitting: Gastroenterology

## 2011-01-21 VITALS — BP 115/73 | HR 79 | Temp 98.1°F | Ht 62.0 in | Wt 163.2 lb

## 2011-01-21 DIAGNOSIS — K7460 Unspecified cirrhosis of liver: Secondary | ICD-10-CM

## 2011-01-21 DIAGNOSIS — R109 Unspecified abdominal pain: Secondary | ICD-10-CM

## 2011-01-21 LAB — AMMONIA: Ammonia: 70 umol/L — ABNORMAL HIGH (ref 11–60)

## 2011-01-21 NOTE — Patient Instructions (Signed)
Please have labs completed and stool sample. We will be calling you with the results.  In the meantime, we are obtaining the reports from Dr. Janalyn Rouse. We will most likely be proceeding with an upper endoscopy due to your symptoms.  Please see the handouts provided.  We will be in touch within the next few days or early next week with the next step.   In the meantime, continue Prilosec, monitor for any increased abdominal pain.

## 2011-01-22 LAB — IGG, IGA, IGM
IgA: 207 mg/dL (ref 68–379)
IgG (Immunoglobin G), Serum: 993 mg/dL (ref 650–1600)
IgM, Serum: 71 mg/dL (ref 41–251)

## 2011-01-22 LAB — ANA: Anti Nuclear Antibody(ANA): NEGATIVE

## 2011-01-22 LAB — ANTI-SMOOTH MUSCLE ANTIBODY, IGG: Smooth Muscle Ab: 6 U (ref <20)

## 2011-01-22 LAB — HEPATITIS C ANTIBODY: HCV Ab: NEGATIVE

## 2011-01-25 ENCOUNTER — Encounter: Payer: Self-pay | Admitting: Gastroenterology

## 2011-01-25 DIAGNOSIS — R109 Unspecified abdominal pain: Secondary | ICD-10-CM | POA: Insufficient documentation

## 2011-01-25 NOTE — Assessment & Plan Note (Signed)
65 year old Caucasian male with new findings of cirrhosis on [place]. Hx of liver biopsy in 2007 with no cirrhosis noted. Possible diagnosis of Mediterranean fever, on colchicine. Question of amyloidosis. Has established care with Dr. Mariel Sleet. Denies hx of ETOH or drug abuse. AP slightly elevated at 161. Complains of loss of appetite, confusion, mental status changes. Pt with complicated medical hx to include stroke. Concern for amyloidosis has been raised. We will proceed with thorough labs to include ammonia level due to confusion. ?hepatic encephalopathy vs stroke deficits. Pt will also need EGD due to recent episodes of melena in March, anemia, epigastric pain, nausea, and loss of appetite. Will need to discuss possibility of  liver biopsy if labs unrevealing.   Ammonia, Viral markers, ASMA, AMA, ANA, Immunoglobulins (recent CBC, anemia panel on file) Follow-up labs, further recommendations to follow

## 2011-01-25 NOTE — Assessment & Plan Note (Signed)
Chronic epigastric pain, LUQ pain X 4-5 years, worsened with eating/drinking. +Nausea. No NSAIDs currently.Last EGD approximately 2 years ago by dr. Janalyn Rouse. We will request results. Hx of melena, anemia during hospital admission March 2012. Will definitely need at the very least an upper endoscopy, possibly colonoscopy depending no reports. [place] with findings of cirrhosis, no CBD dilation.   Continue omeprazole ifobt Lipase Obtain op notes from EGD and colonoscopy.  At very least, proceed with upper endoscopy in the near future with Dr. Jena Gauss once op notes obtained. The risks, benefits, and alternatives have been discussed in detail with patient. They have stated understanding and desire to proceed.

## 2011-01-25 NOTE — Progress Notes (Signed)
Cc to PCP 
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
## 2011-01-27 NOTE — Progress Notes (Signed)
Quick Note:  All normal. We are waiting on Hep B ______

## 2011-01-27 NOTE — Progress Notes (Signed)
Quick Note:  Negative. Please inform pt. We have received all of the labs back that were drawn at his OV. Will be discussing with Dr. Jena Gauss next step when he returns. ______

## 2011-01-28 ENCOUNTER — Telehealth: Payer: Self-pay

## 2011-01-28 NOTE — Telephone Encounter (Signed)
Called cell number- left detailed message on voicemail

## 2011-01-28 NOTE — Telephone Encounter (Signed)
Ok. Have him back off the lactulose this weekend. Resume Monday, with goal of 2 BMs per day. If he continues to have loose stools, contact our office.  In the meantime, his labs look overall normal, and I will be discussing further work-up with RMR. Please let him know further plans will be outlined next week.

## 2011-01-28 NOTE — Telephone Encounter (Signed)
pts wife called- he had diarrhea all day yesterday and x2 today. Wife is very concerned about his blood sugars. He takes metformin bid and insulin on a sliding scale. His normal am blood sugar is 120. This am it was 177 and she gave him insulin. She checked it again prior to lunch and blood sugar was 177 again. She is concerned it will get out of control with all the sugar in the lactulose. Wants to know what they should do. Please advise.

## 2011-02-01 ENCOUNTER — Telehealth: Payer: Self-pay

## 2011-02-01 ENCOUNTER — Other Ambulatory Visit: Payer: Self-pay | Admitting: Urgent Care

## 2011-02-01 DIAGNOSIS — K7460 Unspecified cirrhosis of liver: Secondary | ICD-10-CM

## 2011-02-01 LAB — BASIC METABOLIC PANEL
Chloride: 103 mEq/L (ref 96–112)
Potassium: 4 mEq/L (ref 3.5–5.3)
Sodium: 137 mEq/L (ref 135–145)

## 2011-02-01 LAB — AMMONIA: Ammonia: 119 umol/L — ABNORMAL HIGH (ref 11–60)

## 2011-02-01 NOTE — Telephone Encounter (Signed)
Called lab. pts blood was taken to [facility] at 2:00pm today. Pt has ov with AS tomorrow

## 2011-02-01 NOTE — Telephone Encounter (Signed)
Pt needs STAT ammonia level & Met 7 [id number] & confusion (Call results to me today) Offer OV w/ AS tomorrow or to ER if persistent confusion, falling, dizziness, headache, SOB or any other symptoms. Thanks

## 2011-02-01 NOTE — Telephone Encounter (Signed)
Thanks

## 2011-02-01 NOTE — Telephone Encounter (Signed)
LMOM for pt to come in tomorrow 8/7 at 9am to see AS

## 2011-02-01 NOTE — Telephone Encounter (Signed)
pts wife aware, lab order faxed to lab. Please schedule appt

## 2011-02-01 NOTE — Telephone Encounter (Signed)
FWD AS

## 2011-02-01 NOTE — Telephone Encounter (Signed)
pts wife called- pt has not taken lactulose since Thursday per AS recommendation and has not had a BM in 4 days, he slept all day yesterday and has had 1 episode of confusion. His blood sugars are now normal. pts wife wants to know what she needs to do about his lactulose. Please advise.

## 2011-02-02 ENCOUNTER — Encounter: Payer: Self-pay | Admitting: Gastroenterology

## 2011-02-02 ENCOUNTER — Ambulatory Visit (INDEPENDENT_AMBULATORY_CARE_PROVIDER_SITE_OTHER): Payer: Medicare Other | Admitting: Gastroenterology

## 2011-02-02 VITALS — BP 106/69 | HR 84 | Temp 97.5°F | Ht 68.0 in | Wt 165.0 lb

## 2011-02-02 DIAGNOSIS — K7460 Unspecified cirrhosis of liver: Secondary | ICD-10-CM

## 2011-02-02 DIAGNOSIS — R109 Unspecified abdominal pain: Secondary | ICD-10-CM

## 2011-02-02 NOTE — Assessment & Plan Note (Addendum)
65 year old Caucasian male with incidental findings consistent with cirrhosis on recent US. Liver biopsy in 2007 with macro and microvesicular fatty metamorphosis. 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 Labs thus far unrevealing as noted in HPI. +hepatic encephalopathy with ammonia slightly increased from last draw; needs titration of lactulose, which was discussed in detail with pt and wife. Needs Hep A and B vaccines, q 6 mos [place] and AFP (AFP today), EGD for hx of melena in March, anemia, chronic epigastric pain, loss of appetite, and esophageal varices screening. Still waiting on reports from Dr. Janalyn Rouse for EGD/colon from reportedly 2 years ago. Have requested X 2. Liver biopsy not necessary at this point, as risks likely outweigh benefits. Will proceed with the following labs and EGD in very near future. Hold on colonoscopy at this point until reports obtained. May need colonoscopy at later date.  ~For completeness' sake: ceruloplasmin, alpha-1 antitrypsin ~AFP today, next due Feb 2013 ~Next [place] abd Feb 2013 ~Updated HFP, PT/INR ~2gNa diet ~Hep A and B vaccines: pt to obtain from PCP. Rx provided ~Lactulose tid-qid to help facilitate 2-3 soft BMs per day. It appears 30 ml was too much. Will do 20 ml, which will most likely need to be titrated up. Discussed in detail with pt and wife.  ~Limit tylenol use ~Continue to try and obtain EGD/colonoscopy reports from approximately 2 years ago at [facility], Dr. Janalyn Rouse ~Discussed referral to liver transplant center; may not be a candidate at this time. Will facilitate referral after EGD completed if necessary ~4 week f/u

## 2011-02-02 NOTE — Assessment & Plan Note (Signed)
Chronic epigastric/LUQ pain X 4-5 years, worsened with eating/drinking. Occasional nausea. Denies NSAIDs. Last EGD 2 years ago. No reports available after 2 request. Hx of melena in March, anemia. On omeprazole. Still awaiting ifobt. Heme status unknown. Needs screening for esophageal varices. Will set up for EGD in OR with assistance of Propofol due to chronic narcotics and likely difficult sedation.   Proceed with upper endoscopy in the near future with Dr. Jena Gauss. The risks, benefits, and alternatives have been discussed in detail with patient. They have stated understanding and desire to proceed.  No glucophage morning of procedure.

## 2011-02-02 NOTE — Patient Instructions (Addendum)
Please have additional labs done. We will be contacting you with results.  We have set you up for an upper endoscopy in the near future with Dr. Jena Gauss.  Please see diet handout on low Sodium.  Please follow-up with your primary doctor regarding Hepatitis A and B vaccines. Prescription has been provided.   Please limit tylenol to 2400 millligrams per day.   Lactulose instructions: Starting today take Lactulose 20 ml four time/day. If no results, increase to 30 ml three to four times per day. Your goal is 2-3 soft, semi-formed bowel movements a day. If needed, you may increase dose up to 45 ml three to four times per day. Please contact us if you experience abdominal bloating, nausea, cramping.

## 2011-02-02 NOTE — Progress Notes (Signed)
Cc to PCP 
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
## 2011-02-03 ENCOUNTER — Telehealth: Payer: Self-pay | Admitting: General Practice

## 2011-02-03 LAB — HEPATIC FUNCTION PANEL
ALT: 34 U/L (ref 0–53)
Albumin: 4.6 g/dL (ref 3.5–5.2)
Total Protein: 6.8 g/dL (ref 6.0–8.3)

## 2011-02-03 LAB — PROTIME-INR
INR: 1.13 (ref <1.50)
Prothrombin Time: 15 seconds (ref 11.6–15.2)

## 2011-02-03 LAB — CERULOPLASMIN: Ceruloplasmin: 24 mg/dL (ref 20–60)

## 2011-02-03 LAB — AFP TUMOR MARKER: AFP-Tumor Marker: 7.2 ng/mL (ref 0.0–8.0)

## 2011-02-03 NOTE — Telephone Encounter (Signed)
Message copied by Jennings Books on Wed Feb 03, 2011  9:28 AM ------      Message from: Nira Retort      Created: Tue Feb 02, 2011  3:28 PM       SO SORRY~~      Didn't realize he was on metformin. Please have him hold this the morning of egd. Thanks!

## 2011-02-03 NOTE — Telephone Encounter (Signed)
Informed wife for the pt to hold Metformin the morning of his procedure.

## 2011-02-04 NOTE — Progress Notes (Signed)
Quick Note:  Alk Phos and AST improved. No changes to current regimen. Cirrhosis most likely secondary to NASH. Proceed with EGD as planned. MELD score 5 ______

## 2011-02-08 ENCOUNTER — Telehealth: Payer: Self-pay

## 2011-02-08 NOTE — Telephone Encounter (Signed)
May do 15 ml three times per day. Increase to 4 times per day if needed. Goal is 2-3 soft bowel movements. If by chance he has already had 3 bowel movements in one day and doses are still left, can hold those doses, as he is responding appropriately.   As for hep vaccines, let's check with the health department. I believe they will give.

## 2011-02-08 NOTE — Telephone Encounter (Signed)
pts wife called- pt still having multiple episodes of diarrhea on 20ml qid. She cut him back to 10ml qid and pt is not having any BM's. She wants to know what she should do for him? Please advise.   Also pt can get hep A vaccine paid for and given by pharmacist at [facility], the insurance will not pay for hep B unless it is billed for and given by a doctors office. We do not give injections in the office and we dont have the vaccine to be able to give it to him. pts pcp doesn't carry the vaccine either. pts wife wants to know what they should do. Please advise.

## 2011-02-08 NOTE — Telephone Encounter (Signed)
pts wife aware, she stated he has had some more episodes of diarrhea since she called this am. Will try 15ml, she has already contacted the health dept- they will not give hep b unless pt comes in for STD screenings. She is going to check with her pcp and see if they will give it if pharmacy ships it to them.

## 2011-02-09 ENCOUNTER — Telehealth: Payer: Self-pay

## 2011-02-09 NOTE — Telephone Encounter (Signed)
pts wife called and left voicemail- Rudie Meyer- pharmacist at [facility] has found a way for pt to get hep A and hep B vaccines. Its a combo vaccine that has both in it and medicare will pay for it. They need an Rx for Twinri faxed to [facility].

## 2011-02-09 NOTE — Telephone Encounter (Signed)
AS wrote rx and it has been faxed to [facility]. pts wife aware.

## 2011-02-12 ENCOUNTER — Encounter (HOSPITAL_COMMUNITY)
Admission: RE | Admit: 2011-02-12 | Discharge: 2011-02-12 | Disposition: A | Discharge status: Home / Self Care | Payer: Medicare Other | Source: Ambulatory Visit | Attending: Internal Medicine | Admitting: Internal Medicine

## 2011-02-12 ENCOUNTER — Encounter (HOSPITAL_COMMUNITY): Payer: Self-pay

## 2011-02-12 HISTORY — DX: Major depressive disorder, single episode, unspecified: F32.9

## 2011-02-12 HISTORY — DX: Unspecified cirrhosis of liver: K74.60

## 2011-02-12 HISTORY — DX: Depression, unspecified: F32.A

## 2011-02-12 HISTORY — DX: Anxiety disorder, unspecified: F41.9

## 2011-02-12 HISTORY — DX: Hyperlipidemia, unspecified: E78.5

## 2011-02-12 NOTE — Patient Instructions (Addendum)
20 ADD DINAPOLI  02/12/2011  Your procedure is scheduled on:  02/18/2011  Report to [facility] at  700  AM.  Call this number if you have problems the morning of surgery: [phone]   Remember:   Do not eat food:After Midnight.  Do not drink clear liquids: After Midnight.  Take these medicines the morning of surgery with A SIP OF WATER: Norco, Ativan,AtneoloL,Celexa, Flexaril,Pamelor,Prilosec,Tpoamax,phenergan   Do not wear jewelry, make-up or nail polish.  Do not wear lotions, powders, or perfumes. You may wear deodorant.  Do not shave 48 hours prior to surgery.  Do not bring valuables to the hospital.  Contacts, dentures or bridgework may not be worn into surgery.  Leave suitcase in the car. After surgery it may be brought to your room.  For patients admitted to the hospital, checkout time is 11:00 AM the day of discharge.   Patients discharged the day of surgery will not be allowed to drive home.  Name and phone number of your driver: wife  Special Instructions: N/A   Please read over the following fact sheets that you were given: Pain Booklet, Surgical Site Infection Prevention, Anesthesia Post-op Instructions and Care and Recovery After Surgery PATIENT INSTRUCTIONS POST-ANESTHESIA  IMMEDIATELY FOLLOWING SURGERY:  Do not drive or operate machinery for the first twenty four hours after surgery.  Do not make any important decisions for twenty four hours after surgery or while taking narcotic pain medications or sedatives.  If you develop intractable nausea and vomiting or a severe headache please notify your doctor immediately.  FOLLOW-UP:  Please make an appointment with your surgeon as instructed. You do not need to follow up with anesthesia unless specifically instructed to do so.  WOUND CARE INSTRUCTIONS (if applicable):  Keep a dry clean dressing on the anesthesia/puncture wound site if there is drainage.  Once the wound has quit draining you may leave it open to air.  Generally  you should leave the bandage intact for twenty four hours unless there is drainage.  If the epidural site drains for more than 36-48 hours please call the anesthesia department.  QUESTIONS?:  Please feel free to call your physician or the hospital operator if you have any questions, and they will be happy to assist you.     [facility] Anesthesia Department [address] [place] [phone]

## 2011-02-14 ENCOUNTER — Emergency Department (HOSPITAL_COMMUNITY)
Admission: EM | Admit: 2011-02-14 | Discharge: 2011-02-14 | Disposition: A | Discharge status: Home / Self Care | Payer: Medicare Other | Attending: Emergency Medicine | Admitting: Emergency Medicine

## 2011-02-14 ENCOUNTER — Encounter (HOSPITAL_COMMUNITY): Payer: Self-pay | Admitting: *Deleted

## 2011-02-14 ENCOUNTER — Emergency Department (HOSPITAL_COMMUNITY): Payer: Medicare Other

## 2011-02-14 DIAGNOSIS — R4182 Altered mental status, unspecified: Secondary | ICD-10-CM | POA: Insufficient documentation

## 2011-02-14 DIAGNOSIS — Z8673 Personal history of transient ischemic attack (TIA), and cerebral infarction without residual deficits: Secondary | ICD-10-CM | POA: Insufficient documentation

## 2011-02-14 DIAGNOSIS — E119 Type 2 diabetes mellitus without complications: Secondary | ICD-10-CM | POA: Insufficient documentation

## 2011-02-14 DIAGNOSIS — Z800 Family history of malignant neoplasm of digestive organs: Secondary | ICD-10-CM | POA: Insufficient documentation

## 2011-02-14 DIAGNOSIS — R1011 Right upper quadrant pain: Secondary | ICD-10-CM | POA: Insufficient documentation

## 2011-02-14 DIAGNOSIS — K769 Liver disease, unspecified: Secondary | ICD-10-CM

## 2011-02-14 DIAGNOSIS — F341 Dysthymic disorder: Secondary | ICD-10-CM | POA: Insufficient documentation

## 2011-02-14 DIAGNOSIS — Z87891 Personal history of nicotine dependence: Secondary | ICD-10-CM | POA: Insufficient documentation

## 2011-02-14 DIAGNOSIS — Z801 Family history of malignant neoplasm of trachea, bronchus and lung: Secondary | ICD-10-CM | POA: Insufficient documentation

## 2011-02-14 DIAGNOSIS — I10 Essential (primary) hypertension: Secondary | ICD-10-CM | POA: Insufficient documentation

## 2011-02-14 DIAGNOSIS — Z8042 Family history of malignant neoplasm of prostate: Secondary | ICD-10-CM | POA: Insufficient documentation

## 2011-02-14 DIAGNOSIS — Z79899 Other long term (current) drug therapy: Secondary | ICD-10-CM | POA: Insufficient documentation

## 2011-02-14 DIAGNOSIS — Z7982 Long term (current) use of aspirin: Secondary | ICD-10-CM | POA: Insufficient documentation

## 2011-02-14 DIAGNOSIS — M549 Dorsalgia, unspecified: Secondary | ICD-10-CM | POA: Insufficient documentation

## 2011-02-14 LAB — CBC
MCH: 33.2 pg (ref 26.0–34.0)
MCV: 96.8 fL (ref 78.0–100.0)
Platelets: 104 10*3/uL — ABNORMAL LOW (ref 150–400)
RDW: 14.4 % (ref 11.5–15.5)
WBC: 3.8 10*3/uL — ABNORMAL LOW (ref 4.0–10.5)

## 2011-02-14 LAB — DIFFERENTIAL
Basophils Absolute: 0 10*3/uL (ref 0.0–0.1)
Eosinophils Absolute: 0.1 10*3/uL (ref 0.0–0.7)
Eosinophils Relative: 3 % (ref 0–5)
Lymphocytes Relative: 37 % (ref 12–46)

## 2011-02-14 LAB — URINALYSIS, ROUTINE W REFLEX MICROSCOPIC
Glucose, UA: NEGATIVE mg/dL
Leukocytes, UA: NEGATIVE
pH: 6.5 (ref 5.0–8.0)

## 2011-02-14 LAB — COMPREHENSIVE METABOLIC PANEL
ALT: 40 U/L (ref 0–53)
AST: 48 U/L — ABNORMAL HIGH (ref 0–37)
Albumin: 4.2 g/dL (ref 3.5–5.2)
Calcium: 10.4 mg/dL (ref 8.4–10.5)
Sodium: 133 mEq/L — ABNORMAL LOW (ref 135–145)
Total Protein: 7.6 g/dL (ref 6.0–8.3)

## 2011-02-14 MED ORDER — SODIUM CHLORIDE 0.9 % IV SOLN
Freq: Once | INTRAVENOUS | Status: AC
  Administered 2011-02-14: 12:00:00 via INTRAVENOUS

## 2011-02-14 MED ORDER — OXYCODONE HCL 5 MG PO TABS
5.0000 mg | ORAL_TABLET | Freq: Once | ORAL | Status: AC
  Administered 2011-02-14: 5 mg via ORAL
  Filled 2011-02-14: qty 1

## 2011-02-14 MED ORDER — LACTULOSE 10 GM/15ML PO SOLN: 17.5000 mL | Freq: Three times a day (TID) | ORAL | Status: DC

## 2011-02-14 NOTE — ED Notes (Signed)
Pt c/o headache and request s/t for pain. EDP aware.

## 2011-02-14 NOTE — ED Notes (Signed)
Pt states that he feels that he is beginning to have a migrane HA.  Lights reduced and door closed to reduce noise.

## 2011-02-14 NOTE — ED Notes (Signed)
Pt advised EDP calling Dr. [name] for consult and advised to wait on med for headache at this time. No complaints. Visitors at bedside. Nad. No change in status.

## 2011-02-14 NOTE — ED Notes (Signed)
Error documentation:  IV is in L wrist, not R arm

## 2011-02-14 NOTE — Progress Notes (Signed)
NAMEMarland Wall  DEREL, MCGLASSON NO.:  [id number]  MEDICAL RECORD NO.:  [id number]  LOCATION:  [facility]09                         FACILITY:  [facility]  PHYSICIAN:  Mila Homer. [name], M.D.DATE OF BIRTH:  [birth year]  DATE OF PROCEDURE: DATE OF DISCHARGE:  02/14/2011                                PROGRESS NOTE   SUBJECTIVE:  This 65 year old man developed weakness this morning and some confusion.  He had some difficulty driving his car, so he immediately turned around to home (he had been on his way to church), and family brought him to the emergency room.  He does run a fever and has had intermittent confusion when his ammonia level is high.  Currently he is supposed to be on lactulose.  Thirty mL tried q.i.d. was too strong for him and created severe diarrhea. Fifteen mL q.i.d. was not good enough, and 20 mL q.i.d. produced intermittent results.  OBJECTIVE:  On exam today, he looked like he was normal.  No slurring of speech.  He had 5/5 grip strength bilaterally.  His smile was symmetrical.  His heart had a regular rhythm, rate of 80, and his lungs were clear throughout.  His head CT showed some mild brain atrophy with prominence of the ventricles but no change from the last brain CT.  His temperature is 98.3, pulse 82, blood pressure 115/71, respiratory rate 20, O2 saturation 97%.  His lipase was 65, up from 28 when it was measured last.  His ammonia level was 80 down from 119 August 6th and up from 70 July 26th.  He had a sodium of 133, glucose 171, AST 48, alkaline phosphatase 168. Platelets 104,000, white cell count 3800, and RBC 4.10.  DIAGNOSES: 1. Confusion probably secondary to increased ammonia levels. 2. Increased ammonia levels. 3. Mediterranean fever. 4. Type 2 diabetes. 5. Benign essential hypertension. 6. Depression. 7. Benign prostatic hypertrophy. 8. Reflux esophagitis. 9. Hypercholesterolemia.  PLAN:  I discussed all this with the patient and his  wife.  He is going to increase his lactulose to 17.5 mL q.i.d. and see if this will balance the diarrhea with the ammonia levels.  We can check an ammonia level later this week on this level.  I talked to the ER doctor as well who felt that given that he had this is an intermittent problem and there was no sign of damage on the head CT, that he could certainly go home at this point and be followed by as outpatient unless there was a change in his status.     Mila Homer. [name], M.D.     SDK/MEDQ  D:  02/14/2011  T:  02/14/2011  Job:  [id number]

## 2011-02-14 NOTE — ED Notes (Signed)
Pt arrives to er via ems with c/o altered loc, wife states that on the way to church she noticed that pt seemed more confused, wife told ems  that pt has had problems with elevated ammonia levels recently and problems with urination.

## 2011-02-14 NOTE — ED Notes (Signed)
Patient unable to give urine specimen.  Left urinal at bedside

## 2011-02-14 NOTE — ED Notes (Signed)
Pt taken to CT at this time.

## 2011-02-14 NOTE — ED Notes (Signed)
Pt returned from CT. Nad. No change.
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
## 2011-02-18 ENCOUNTER — Encounter (HOSPITAL_COMMUNITY)
Admission: RE | Disposition: A | Discharge status: Home / Self Care | Payer: Self-pay | Source: Ambulatory Visit | Attending: Internal Medicine

## 2011-02-18 ENCOUNTER — Encounter (HOSPITAL_COMMUNITY): Payer: Self-pay | Admitting: Anesthesiology

## 2011-02-18 ENCOUNTER — Ambulatory Visit (HOSPITAL_COMMUNITY)
Admission: RE | Admit: 2011-02-18 | Discharge: 2011-02-18 | Disposition: A | Discharge status: Home / Self Care | Payer: Medicare Other | Source: Ambulatory Visit | Attending: Internal Medicine | Admitting: Internal Medicine

## 2011-02-18 ENCOUNTER — Ambulatory Visit (HOSPITAL_COMMUNITY): Payer: Medicare Other | Admitting: Anesthesiology

## 2011-02-18 DIAGNOSIS — Z01812 Encounter for preprocedural laboratory examination: Secondary | ICD-10-CM | POA: Insufficient documentation

## 2011-02-18 DIAGNOSIS — R109 Unspecified abdominal pain: Secondary | ICD-10-CM

## 2011-02-18 DIAGNOSIS — K7460 Unspecified cirrhosis of liver: Secondary | ICD-10-CM | POA: Insufficient documentation

## 2011-02-18 DIAGNOSIS — I10 Essential (primary) hypertension: Secondary | ICD-10-CM | POA: Insufficient documentation

## 2011-02-18 DIAGNOSIS — I8500 Esophageal varices without bleeding: Secondary | ICD-10-CM

## 2011-02-18 DIAGNOSIS — E119 Type 2 diabetes mellitus without complications: Secondary | ICD-10-CM | POA: Insufficient documentation

## 2011-02-18 DIAGNOSIS — K319 Disease of stomach and duodenum, unspecified: Secondary | ICD-10-CM

## 2011-02-18 DIAGNOSIS — Z79899 Other long term (current) drug therapy: Secondary | ICD-10-CM | POA: Insufficient documentation

## 2011-02-18 DIAGNOSIS — Z7982 Long term (current) use of aspirin: Secondary | ICD-10-CM | POA: Insufficient documentation

## 2011-02-18 DIAGNOSIS — I8510 Secondary esophageal varices without bleeding: Secondary | ICD-10-CM | POA: Insufficient documentation

## 2011-02-18 HISTORY — PX: ESOPHAGOGASTRODUODENOSCOPY: SHX5428

## 2011-02-18 LAB — GLUCOSE, CAPILLARY: Glucose-Capillary: 129 mg/dL — ABNORMAL HIGH (ref 70–99)

## 2011-02-18 SURGERY — EGD (ESOPHAGOGASTRODUODENOSCOPY)
Anesthesia: Monitor Anesthesia Care

## 2011-02-18 MED ORDER — PROPOFOL 10 MG/ML IV EMUL
INTRAVENOUS | Status: DC | PRN
  Administered 2011-02-18: 50 ug/kg/min via INTRAVENOUS

## 2011-02-18 MED ORDER — BUTAMBEN-TETRACAINE-BENZOCAINE 2-2-14 % EX AERO
2.0000 | INHALATION_SPRAY | Freq: Once | CUTANEOUS | Status: AC
  Administered 2011-02-18: 2 via TOPICAL

## 2011-02-18 MED ORDER — PROPOFOL 10 MG/ML IV EMUL
INTRAVENOUS | Status: AC
  Filled 2011-02-18: qty 20

## 2011-02-18 MED ORDER — FENTANYL CITRATE 0.05 MG/ML IJ SOLN
INTRAMUSCULAR | Status: DC | PRN
  Administered 2011-02-18 (×2): 50 ug via INTRAVENOUS

## 2011-02-18 MED ORDER — MIDAZOLAM HCL 2 MG/2ML IJ SOLN
1.0000 mg | INTRAMUSCULAR | Status: DC | PRN
Start: 2011-02-18 — End: 2011-02-18
  Administered 2011-02-18: 2 mg via INTRAVENOUS

## 2011-02-18 MED ORDER — FENTANYL CITRATE 0.05 MG/ML IJ SOLN
INTRAMUSCULAR | Status: AC
  Filled 2011-02-18: qty 2

## 2011-02-18 MED ORDER — LACTATED RINGERS IV SOLN
INTRAVENOUS | Status: DC | PRN
  Administered 2011-02-18: 08:00:00 via INTRAVENOUS

## 2011-02-18 MED ORDER — MIDAZOLAM HCL 2 MG/2ML IJ SOLN
INTRAMUSCULAR | Status: AC
  Filled 2011-02-18: qty 2

## 2011-02-18 MED ORDER — GLYCOPYRROLATE 0.2 MG/ML IJ SOLN
0.2000 mg | Freq: Once | INTRAMUSCULAR | Status: AC
  Administered 2011-02-18: 0.2 mg via INTRAVENOUS

## 2011-02-18 MED ORDER — MIDAZOLAM HCL 5 MG/5ML IJ SOLN
INTRAMUSCULAR | Status: DC | PRN
  Administered 2011-02-18 (×2): 1 mg via INTRAVENOUS

## 2011-02-18 MED ORDER — GLYCOPYRROLATE 0.2 MG/ML IJ SOLN
INTRAMUSCULAR | Status: AC
  Filled 2011-02-18: qty 1

## 2011-02-18 MED ORDER — LACTATED RINGERS IV SOLN
INTRAVENOUS | Status: DC
Start: 2011-02-18 — End: 2011-02-18

## 2011-02-18 MED ORDER — ONDANSETRON HCL 4 MG/2ML IJ SOLN
INTRAMUSCULAR | Status: AC
  Administered 2011-02-18: 4 mg
  Filled 2011-02-18: qty 2

## 2011-02-18 MED ORDER — LACTATED RINGERS IV SOLN
INTRAVENOUS | Status: DC
  Administered 2011-02-18: 08:00:00 via INTRAVENOUS

## 2011-02-18 MED ORDER — ONDANSETRON HCL 4 MG/2ML IJ SOLN: 4.0000 mg | Freq: Once | INTRAMUSCULAR | Status: DC

## 2011-02-18 MED ORDER — STERILE WATER FOR IRRIGATION IR SOLN
Status: DC | PRN
  Administered 2011-02-18 (×2)

## 2011-02-18 MED ORDER — LIDOCAINE HCL (PF) 1 % IJ SOLN
INTRAMUSCULAR | Status: AC
  Filled 2011-02-18: qty 5

## 2011-02-18 SURGICAL SUPPLY — 18 items
BLOCK BITE 60FR ADLT L/F BLUE (MISCELLANEOUS) ×2 IMPLANT
DEVICE CLIP HEMOSTAT 235CM (CLIP) IMPLANT
ELECT REM PT RETURN 9FT ADLT (ELECTROSURGICAL)
ELECTRODE REM PT RTRN 9FT ADLT (ELECTROSURGICAL) IMPLANT
FLOOR PAD 36X40 (MISCELLANEOUS) ×2
FORCEP RJ3 GP 1.8X160 W-NEEDLE (CUTTING FORCEPS) IMPLANT
FORCEPS BIOP RAD 4 LRG CAP 4 (CUTTING FORCEPS) IMPLANT
MANIFOLD NEPTUNE WASTE (CANNULA) ×2 IMPLANT
NEEDLE SCLEROTHERAPY 25GX240 (NEEDLE) IMPLANT
PAD FLOOR 36X40 (MISCELLANEOUS) ×1 IMPLANT
PROBE APC STR FIRE (PROBE) IMPLANT
PROBE INJECTION GOLD (MISCELLANEOUS)
PROBE INJECTION GOLD 7FR (MISCELLANEOUS) IMPLANT
SNARE ROTATE MED OVAL 20MM (MISCELLANEOUS) IMPLANT
SYR 50ML LL SCALE MARK (SYRINGE) ×2 IMPLANT
TUBING ENDO SMARTCAP PENTAX (MISCELLANEOUS) ×2 IMPLANT
TUBING IRRIGATION ENDOGATOR (MISCELLANEOUS) ×2 IMPLANT
WATER STERILE IRR 1000ML POUR (IV SOLUTION) ×4 IMPLANT

## 2011-02-18 NOTE — Anesthesia Procedure Notes (Signed)
Procedure Name: MAC Performed by: Corena Pilgrim Pre-anesthesia Checklist: Patient identified, Emergency Drugs available, Patient being monitored and Timeout performed Patient Re-evaluated:Patient Re-evaluated prior to inductionOxygen Delivery Method: Simple face mask

## 2011-02-18 NOTE — Anesthesia Preprocedure Evaluation (Addendum)
Anesthesia Evaluation  Name, MR# and DOB Patient awake  General Assessment Comment  History of Anesthesia Complications Negative for: history of anesthetic complications  Airway Mallampati: II TM Distance: >3 FB Neck ROM: Full    Dental  (+) Poor Dentition, Chipped, Missing, Loose and Dental Advisory Given   Pulmonary  clear to auscultation  breath sounds clear to auscultation none    Cardiovascular hypertension (atenolol at 0530), Pt. on medications Regular Normal    Neuro/Psych   Headaches  (+) Anxiety, Depression,  CVA, Residual Symptoms   GI/Hepatic/Renal   Endo/Other  (+) Diabetes mellitus-, Well Controlled, Type 2, Oral Hypoglycemic Agents,     Abdominal   Musculoskeletal   Hematology   Peds  Reproductive/Obstetrics    Anesthesia Other Findings             Anesthesia Physical Anesthesia Plan  ASA: III  Anesthesia Plan: MAC   Post-op Pain Management:    Induction: Intravenous  Airway Management Planned: Nasal Cannula  Additional Equipment:   Intra-op Plan:   Post-operative Plan:   Informed Consent: I have reviewed the patients History and Physical, chart, labs and discussed the procedure including the risks, benefits and alternatives for the proposed anesthesia with the patient or authorized representative who has indicated his/her understanding and acceptance.     Plan Discussed with:   Anesthesia Plan Comments:         Anesthesia Quick Evaluation
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
## 2011-02-18 NOTE — Anesthesia Postprocedure Evaluation (Signed)
Anesthesia Post Note  Patient: Brad Wall  Procedure(s) Performed:  ESOPHAGOGASTRODUODENOSCOPY (EGD) - with propofol.   portal gastropathy, small hernia, grade one esophageal varices-not treated.  Anesthesia type: MAC  Patient location: PACU  Post pain: Pain level controlled  Post assessment: Post-op Vital signs reviewed  Last Vitals:  Filed Vitals:   02/18/11 0917  BP: 100/70  Pulse: 74  Temp: 97.8 F (36.6 C)  Resp: 16    Post vital signs: stable  Level of consciousness: awake, alert  and oriented  Complications: No apparent anesthesia complications

## 2011-02-18 NOTE — Transfer of Care (Signed)
  Anesthesia Post-op Note  Patient: Brad Wall  Procedure(s) Performed:  ESOPHAGOGASTRODUODENOSCOPY (EGD) - with propofol.   portal gastropathy, small hernia, grade one esophageal varices-not treated.  Patient Location: PACU  Anesthesia Type: MAC  Level of Consciousness: awake, alert , oriented and patient cooperative  Airway and Oxygen Therapy: Patient Spontanous Breathing and Patient connected to face mask oxygen  Post-op Pain: none  Post-op Assessment: Post-op Vital signs reviewed, Patient's Cardiovascular Status Stable, Respiratory Function Stable, Patent Airway and No signs of Nausea or vomiting  Post-op Vital Signs: stable  Complications: No apparent anesthesia complications

## 2011-02-18 NOTE — Addendum Note (Signed)
Addendum  created 02/18/11 1113 by Corena Pilgrim, CRNA   Modules edited:Anesthesia Responsible Staff

## 2011-02-22 ENCOUNTER — Encounter (HOSPITAL_COMMUNITY): Payer: Self-pay | Admitting: Internal Medicine

## 2011-02-24 ENCOUNTER — Ambulatory Visit (HOSPITAL_COMMUNITY): Payer: Medicare Other | Admitting: Oncology

## 2011-02-24 ENCOUNTER — Telehealth: Payer: Self-pay

## 2011-02-24 NOTE — Telephone Encounter (Signed)
Pts wife called- pt is on lactulose 30ml tid and having multiple episodes of diarrhea and wants to know how they should change meds.

## 2011-02-25 ENCOUNTER — Telehealth: Payer: Self-pay

## 2011-02-25 NOTE — Telephone Encounter (Signed)
May decrease it to 30 BID. If needed can decrease dose to 20ml at least once per day, see how he does.

## 2011-02-25 NOTE — Telephone Encounter (Signed)
Ginger took care of this. See separate phone note.

## 2011-02-25 NOTE — Telephone Encounter (Signed)
Called wife back and told her what was said. She was very thankful and she will call back on Tuesday for a PR. I told her she was doing a good job and just to take it easy. Make sure his taking in fluids.

## 2011-02-25 NOTE — Telephone Encounter (Signed)
Hold lactulose for now. Goal is for patient to have 3 BMs daily.  Do not resume lactulose until stools have decreased to no more than 3 per day. If this does not occur, they need to call us. Once resume lactulose, start with 15cc (one tablespoon) twice a day. It is okay to increase dose to achieve 3 BMs daily or to hold it if having to many BMs. Call with further questions or if patient does not improve. Encourage oral intake.

## 2011-02-25 NOTE — Telephone Encounter (Signed)
Patient's wife called back this morning upset because patient has the diarrhea and can not get out of the bathroom. He is taking Lactulose TID and he is a diabetic and she is afraid he is going to dehydrate. Please advised.

## 2011-03-02 ENCOUNTER — Ambulatory Visit: Payer: Medicare Other | Admitting: Gastroenterology

## 2011-03-04 ENCOUNTER — Ambulatory Visit: Payer: Medicare Other | Admitting: Gastroenterology

## 2011-03-08 ENCOUNTER — Telehealth: Payer: Self-pay | Admitting: Gastroenterology

## 2011-03-08 NOTE — Telephone Encounter (Signed)
Please contact pt and wife and inform them we are going to refer them to a tertiary transplant care facility to see if he would be a candidate. This is per Dr. Luvenia Starch last EGD note.

## 2011-03-08 NOTE — Telephone Encounter (Signed)
Please disregard this note until I speak with Dr. Jena Gauss. I will let you know exactly the reason he is referring.

## 2011-03-08 NOTE — Telephone Encounter (Signed)
Ok

## 2011-04-12 ENCOUNTER — Telehealth: Payer: Self-pay

## 2011-04-12 NOTE — Telephone Encounter (Signed)
Pt's wife called- pt is having abd pain. The pain is on the L side and moves into the middle of his stomach and under his ribs. Pt stated the pain was about a 6 on the pain scale. No vomiting, no nausea, no blood in stool. Pts last BM was a couple of hours ago and it was normal. Pt has been changed from his regular blood pressure medicine to Losartan 100mg  by Dr. [name] at the request of RMR. His blood pressure has been very low at 90/60 yesterday and she did not give him his blood pressure medicine to day and bp was only 104/69. Advised her to call Dr. [name] about the blood pressure issues but wife was insisting that I let RMR know about the blood pressure and feels like pts problems needed to be addressed by RMR first.   Please advise.

## 2011-04-13 NOTE — Telephone Encounter (Signed)
Per AS- ok to bring in for ov prior to sending referral. Pts wife aware, she wants to bring him in to be seen soon. Ov made for tomorrow at 1:30 with AS.

## 2011-04-13 NOTE — Telephone Encounter (Signed)
Tried to call pt- LMOM 

## 2011-04-13 NOTE — Telephone Encounter (Signed)
I recommended the patient be switched to a nonselective beta blocker i.e. Inderal or Naldolol, not Losartan previously. See EGD dictation. Had abdominal pain going back to at least August of this year. I recommended referral to a tertiary care center. Patient needs to follow up with Dr. Duffy Rhody the next step in his evaluation.

## 2011-04-14 ENCOUNTER — Encounter: Payer: Self-pay | Admitting: Gastroenterology

## 2011-04-14 ENCOUNTER — Ambulatory Visit (INDEPENDENT_AMBULATORY_CARE_PROVIDER_SITE_OTHER): Payer: Medicare Other | Admitting: Gastroenterology

## 2011-04-14 VITALS — BP 109/74 | HR 108 | Temp 98.1°F | Ht 68.0 in | Wt 163.0 lb

## 2011-04-14 DIAGNOSIS — K7460 Unspecified cirrhosis of liver: Secondary | ICD-10-CM

## 2011-04-14 DIAGNOSIS — R109 Unspecified abdominal pain: Secondary | ICD-10-CM

## 2011-04-14 NOTE — Patient Instructions (Signed)
Please complete labs and stool sample. We will be calling you with the results. Depending on these results, we may need to proceed with a colonoscopy. However, that will be determined once everything is received.  Start taking a probiotic daily. We have given you samples of Align. You may get this over the counter.  We have also referred you to [facility] for further assessment of abdominal pain as well as your history of Mediterranean Fever.   We will see you back in our office in Jan 2012 regardless for follow-up of your liver.
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
## 2011-04-15 LAB — CBC WITH DIFFERENTIAL/PLATELET
Basophils Absolute: 0 10*3/uL (ref 0.0–0.1)
Eosinophils Relative: 3 % (ref 0–5)
HCT: 38.9 % — ABNORMAL LOW (ref 39.0–52.0)
Lymphocytes Relative: 23 % (ref 12–46)
MCV: 99.7 fL (ref 78.0–100.0)
Monocytes Absolute: 0.3 10*3/uL (ref 0.1–1.0)
Monocytes Relative: 7 % (ref 3–12)
RDW: 13.9 % (ref 11.5–15.5)
WBC: 4.6 10*3/uL (ref 4.0–10.5)

## 2011-04-15 NOTE — Assessment & Plan Note (Addendum)
65 year old Caucasian male with multiple health problems to include diagnosis of Mediterranean fever and likely NASH cirrhosis. He has had screening for varices, next due in 2014. AFP, [place] due in Feb 2013. Given rx for Hep A and B vaccines at appt in Aug 2012. Needs to be on non-selective beta-blocker such as Nadolol or Inderal due to esophageal varices. On Losartan currently. Will cc PCP for this.  Doing well with lactulose, no encephalopathic issues currently, no jaundice, pruritis, or other issues. Chronic abdominal pain main issue, to be addressed below. Will facilitate referral to transplant center; unsure if he will be a candidate, but will start this process for him. Needs to return in January for f/u of cirrhosis. Will be due for AFP, [place] in Feb 2013.

## 2011-04-15 NOTE — Progress Notes (Signed)
Cc to PCP 

## 2011-04-15 NOTE — Assessment & Plan Note (Signed)
Chronic epigastric and LLQ pain. Has had EGD Aug 2012, last colonoscopy in 2010 with Dr. Lavenia Atlas in [facility] showing diverticulosis and adenomatous polyps. In those records, pt was dealing with pain at that time as well. He has continued to report depression, lack of appetite, wt loss. He does have a FH of colon cancer in 2 first degree relatives (mother/father). He has had no change in bowel habits or evidence of rectal bleeding. I really feel we are dealing with chronic abdominal pain, compounded by depression. I highly doubt an underlying etiology is at work, but due to hx of adenomatous polyps and FH, I'd like to update his CBC, ferritin, and obtain an ifobt. I discussed the possibility of a colonoscopy if ferritin was dropping/Hgb dropping/ifobt positive. However, we will still refer to a tertiary center (likely [facility]) for a second opinion regarding his multiple issues/complaints. It would be wonderful if the hx of Mediterranean fever could be addressed at that time as well.   Referral to tertiary care facility for chronic abdominal pain CBC, ferritin, ifobt. Consider lower GI tract evaluation if needed  Return in Jan for evaluation.

## 2011-04-20 NOTE — Progress Notes (Signed)
Pt referred to [facility]

## 2011-04-21 NOTE — Progress Notes (Signed)
Quick Note:  Overall, Hgb/Hct roughly the same. Normocytic anemia, likely multifactorial. Encouragingly, ferritin continues to improve.  We need ifobt. Please remind pt to submit this. ______

## 2011-04-23 ENCOUNTER — Telehealth: Payer: Self-pay | Admitting: Gastroenterology

## 2011-04-23 NOTE — Telephone Encounter (Signed)
Received call from [facility]- Brad Wall is scheduled in their clinic on 04/27/11- he is aware of the appt

## 2011-05-03 ENCOUNTER — Ambulatory Visit (INDEPENDENT_AMBULATORY_CARE_PROVIDER_SITE_OTHER): Payer: Medicare Other | Admitting: Gastroenterology

## 2011-05-03 DIAGNOSIS — R109 Unspecified abdominal pain: Secondary | ICD-10-CM

## 2011-05-03 NOTE — Progress Notes (Signed)
Quick Note:  Negative. No need for colonoscopy. Labs are overall stable. Continue with plan of tertiary appt. To return in Jan. ______

## 2011-05-06 ENCOUNTER — Telehealth: Payer: Self-pay

## 2011-05-06 ENCOUNTER — Ambulatory Visit: Payer: Medicare Other | Admitting: Gastroenterology

## 2011-05-06 NOTE — Telephone Encounter (Signed)
Pts wife aware. She will call back next week if diarrhea continues into the weekend.

## 2011-05-06 NOTE — Telephone Encounter (Signed)
pts wife called- pt has had diarrhea since Tuesday night, when they got back from appt at [facility]. Wife is not giving him the lactulose right now. His bp is 120/80 and pulse is 103. He is not on any bp meds. She gave him 2 immodium but is scared to give him anymore and wants to know what he should do about his lactulose. Please advise.

## 2011-05-06 NOTE — Telephone Encounter (Signed)
Hold his lactulose for now until diarrhea stops. Take imodium prn. Follow Bland diet. Watch for any signs of fever/chills. BP looks good, pulse probably up due to several factors (anxiety? Little dehydrated? Etc) Resume lactulose when diarrhea stopped.  Reassure wife she did the right thing. If his diarrhea continues, get set of stool studies (Cdiff, culture only).

## 2011-05-14 ENCOUNTER — Telehealth: Payer: Self-pay | Admitting: Internal Medicine

## 2011-05-14 NOTE — Telephone Encounter (Signed)
Spoke with Brad Wall- they need documentation of RMRs recommendations of changing pts bp medication. I faxed copy of pts egd report to the attention of Dr. [name].

## 2011-05-14 NOTE — Telephone Encounter (Signed)
Janey Greaser (pt's wife) called to speak with RMR's nurse. Please return her call at [phone]. In regards to a letter about his blood pressure medicines.

## 2011-06-24 ENCOUNTER — Other Ambulatory Visit: Payer: Self-pay

## 2011-06-24 MED ORDER — LACTULOSE 10 GM/15ML PO SOLN: 17.5000 mL | Freq: Three times a day (TID) | ORAL | Status: DC

## 2011-06-28 ENCOUNTER — Encounter: Payer: Self-pay | Admitting: Internal Medicine

## 2011-07-15 ENCOUNTER — Encounter (HOSPITAL_COMMUNITY): Payer: Medicare Other | Attending: Oncology

## 2011-07-15 DIAGNOSIS — D696 Thrombocytopenia, unspecified: Secondary | ICD-10-CM | POA: Diagnosis not present

## 2011-07-15 LAB — CBC
HCT: 40 % (ref 39.0–52.0)
Hemoglobin: 13.1 g/dL (ref 13.0–17.0)
MCV: 100.5 fL — ABNORMAL HIGH (ref 78.0–100.0)
RBC: 3.98 MIL/uL — ABNORMAL LOW (ref 4.22–5.81)
RDW: 14 % (ref 11.5–15.5)
WBC: 4 10*3/uL (ref 4.0–10.5)

## 2011-07-15 NOTE — Progress Notes (Signed)
Labs drawn today for cbc 

## 2011-07-16 ENCOUNTER — Encounter (HOSPITAL_BASED_OUTPATIENT_CLINIC_OR_DEPARTMENT_OTHER): Payer: Medicare Other | Admitting: Oncology

## 2011-07-16 ENCOUNTER — Encounter (HOSPITAL_COMMUNITY): Payer: Self-pay | Admitting: Oncology

## 2011-07-16 VITALS — BP 90/60 | HR 69 | Temp 98.0°F | Ht 68.0 in | Wt 162.0 lb

## 2011-07-16 DIAGNOSIS — R1012 Left upper quadrant pain: Secondary | ICD-10-CM

## 2011-07-16 DIAGNOSIS — R161 Splenomegaly, not elsewhere classified: Secondary | ICD-10-CM

## 2011-07-16 DIAGNOSIS — K769 Liver disease, unspecified: Secondary | ICD-10-CM | POA: Diagnosis not present

## 2011-07-16 DIAGNOSIS — D696 Thrombocytopenia, unspecified: Secondary | ICD-10-CM | POA: Diagnosis not present

## 2011-07-16 NOTE — Patient Instructions (Signed)
Brad Wall  [id number] 1945-12-17   [facility]  Discharge Instructions  RECOMMENDATIONS MADE BY THE CONSULTANT AND ANY TEST RESULTS WILL BE SENT TO YOUR REFERRING DOCTOR.   EXAM FINDINGS BY MD TODAY AND SIGNS AND SYMPTOMS TO REPORT TO CLINIC OR PRIMARY MD: MD thinks the left-sided abdominal pain is due to your spleen and possible the fluid in your abdomen  MEDICATIONS PRESCRIBED: Tamsulosin 0.4 mg take 1 pill twice daily.  If not better in 2 weeks need to see a urologist. Follow label directions  INSTRUCTIONS GIVEN AND DISCUSSED: Other :  Report any unusual brusing or bleeding.  SPECIAL INSTRUCTIONS/FOLLOW-UP: Lab work Needed in August and Return to Clinic in August a few days after your blood work.   I acknowledge that I have been informed and understand all the instructions given to me and received a copy. I do not have any more questions at this time, but understand that I may call the Specialty Clinic at [facility] at [phone] during business hours should I have any further questions or need assistance in obtaining follow-up care.    __________________________________________  _____________  __________ Signature of Patient or Authorized Representative            Date                   Time    __________________________________________ Nurse's Signature

## 2011-07-16 NOTE — Progress Notes (Signed)
This office note has been dictated.
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
## 2011-07-22 ENCOUNTER — Encounter: Payer: Self-pay | Admitting: Gastroenterology

## 2011-07-22 ENCOUNTER — Ambulatory Visit (INDEPENDENT_AMBULATORY_CARE_PROVIDER_SITE_OTHER): Payer: Medicare Other | Admitting: Gastroenterology

## 2011-07-22 DIAGNOSIS — Z800 Family history of malignant neoplasm of digestive organs: Secondary | ICD-10-CM | POA: Insufficient documentation

## 2011-07-22 DIAGNOSIS — D126 Benign neoplasm of colon, unspecified: Secondary | ICD-10-CM | POA: Diagnosis not present

## 2011-07-22 DIAGNOSIS — K7460 Unspecified cirrhosis of liver: Secondary | ICD-10-CM

## 2011-07-22 DIAGNOSIS — K635 Polyp of colon: Secondary | ICD-10-CM

## 2011-07-22 DIAGNOSIS — Z86010 Personal history of colonic polyps: Secondary | ICD-10-CM | POA: Insufficient documentation

## 2011-07-22 LAB — BASIC METABOLIC PANEL
CO2: 24 mEq/L (ref 19–32)
Chloride: 103 mEq/L (ref 96–112)
Potassium: 4.4 mEq/L (ref 3.5–5.3)
Sodium: 136 mEq/L (ref 135–145)

## 2011-07-22 LAB — HEPATIC FUNCTION PANEL
ALT: 66 U/L — ABNORMAL HIGH (ref 0–53)
AST: 62 U/L — ABNORMAL HIGH (ref 0–37)
Alkaline Phosphatase: 178 U/L — ABNORMAL HIGH (ref 39–117)
Bilirubin, Direct: 0.2 mg/dL (ref 0.0–0.3)
Indirect Bilirubin: 0.3 mg/dL (ref 0.0–0.9)
Total Bilirubin: 0.5 mg/dL (ref 0.3–1.2)

## 2011-07-22 LAB — PROTIME-INR: INR: 1.11 (ref <1.50)

## 2011-07-22 NOTE — Progress Notes (Signed)
Primary Care Physician: Milana Obey, MD, MD  Primary Gastroenterologist:  Roetta Sessions, MD   Chief Complaint  Patient presents with  . Follow-up    sleeps alot    HPI: Brad Wall is a 66 y.o. male here for f/u cirrhosis. Seen at [facility] liver transplant clinic 04/2011. MELD 7. Add Xifaxan 550mg  BID. Advised to "scale down" use of pain medication as may be contributing to mental status issues. OV prn at [facility] only at this point.  Since Christmas, gone downhill. Sleeps a lot. 6-7am awakes for meds. 9am awakes for BF. Noon gets up to recliner nods off in recliner all afternoon. Eats dinner, watches tv, by 8pm in the bed. Yesterday incoherent. Thought yesterday was going to doctor instead of church. Several episodes of little things like that. Talking about something that happened few days ago but saying it was months ago. Having at least two BMs per day. Yesterday gave extra lactulose 20g bid for increased confusion.   Six years ago fully functional. 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 Wife at to quit work to take care of him. They moved here couple of years ago to be near son who helps them. Financial issues now. Wife very tearful. Previously on Colchicine for mediterranean fever. Strokes without significant deficits, last one few yrs ago. Wife states patient is merely existing. Hearing bad too. He sleeps a lot but eats well.  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 Chronic back pain. States he cannot live without pain meds.    Current Outpatient Prescriptions  Medication Sig Dispense Refill  . aspirin 325 MG EC tablet Take 325 mg by mouth daily.        . butalbital-acetaminophen-caffeine (FIORICET, ESGIC) 50-325-40 MG per tablet Take 1 tablet by mouth every 4 (four) hours as needed. For headache      . citalopram (CELEXA) 40 MG tablet Take 40 mg by mouth daily.        . cyclobenzaprine (FLEXERIL) 10 MG tablet Take 10 mg  by mouth 3 (three) times daily as needed. For pain      . Ferrous Sulfate (IRON) 325 (65 FE) MG TABS Take 65 mg by mouth 2 (two) times daily.       Marland Kitchen gemfibrozil (LOPID) 600 MG tablet Take 600 mg by mouth 2 (two) times daily before a meal.        . HYDROcodone-acetaminophen (NORCO) 10-325 MG per tablet Take 1 tablet by mouth every 4 (four) hours as needed. For pain. Takes about 4 daily.      Marland Kitchen lactulose (CHRONULAC) 10 GM/15ML solution Take 10 g by mouth 2 (two) times daily. MD is still working on this dosage according to patients needs      . LORazepam (ATIVAN) 1 MG tablet Take 1 mg by mouth every 8 (eight) hours.       . metFORMIN (GLUCOPHAGE) 500 MG tablet Take 500 mg by mouth 2 (two) times daily with a meal.       . Multiple Vitamin (MULTIVITAMIN) tablet Take 1 tablet by mouth daily.        . niacin (NIASPAN) 500 MG CR tablet Take 500 mg by mouth at bedtime.        . nortriptyline (PAMELOR) 25 MG capsule Take 75 mg by mouth at bedtime.       Marland Kitchen omeprazole (PRILOSEC) 20 MG capsule Take 20 mg by mouth 2 (two) times daily.        Marland Kitchen  oxymetazoline (AFRIN) 0.05 % nasal spray Place 2 sprays into the nose 2 (two) times daily as needed. For nasal dryness      . promethazine (PHENERGAN) 25 MG tablet Take 25 mg by mouth every 6 (six) hours as needed. Nausea. Rarely takes.      . propranolol (INDERAL) 10 MG tablet Take 10 mg by mouth daily.      . rifaximin (XIFAXAN) 550 MG TABS Take 550 mg by mouth 2 (two) times daily.      . simvastatin (ZOCOR) 10 MG tablet Take 10 mg by mouth at bedtime.        . Tamsulosin HCl (FLOMAX) 0.4 MG CAPS Take 0.4 mg by mouth daily.        Marland Kitchen topiramate (TOPAMAX) 50 MG tablet Take 50-100 mg by mouth 2 (two) times daily. 50 mg in the morning and 100 mg at bedtime        Allergies as of 07/22/2011 - Review Complete 07/22/2011  Allergen Reaction Noted  . Penicillins Hives and Itching     ROS:  General: See HPI. C/O itching. ENT: Negative for hoarseness, difficulty  swallowing , nasal congestion. CV: Negative for chest pain, angina, palpitations, dyspnea on exertion, peripheral edema.  Respiratory: Negative for dyspnea at rest, dyspnea on exertion, cough, sputum, wheezing.  GI: See history of present illness. GU:  Negative for dysuria, hematuria, urinary incontinence, urinary frequency, nocturnal urination.  Endo: Negative for unusual weight change. Weight stable last six months.    Physical Examination:   BP 114/78  Pulse 93  Temp(Src) 99.1 F (37.3 C) (Temporal)  Ht 5\' 7"  (1.702 m)  Wt 161 lb 9.6 oz (73.301 kg)  BMI 25.31 kg/m2  General: Well-developed WM in no acute distress. Drowsy appearing. Accompanied by wife to does most of the talking. He answers some questions.   Eyes: No icterus. Mouth: Oropharyngeal mucosa moist and pink , no lesions erythema or exudate. Lungs: Clear to auscultation bilaterally.  Heart: Regular rate and rhythm, no murmurs rubs or gallops.  Abdomen: Bowel sounds are normal, mild epigastric tenderness. Nondistended. I did not appreciate splenomegaly. No masses, no abdominal bruits or hernia , no rebound or guarding.   Extremities: No lower extremity edema. No clubbing or deformities. Neuro: Alert and oriented x 4  . ?mild asterixis. Skin: Warm and dry, no jaundice.   Psych: Alert and cooperative, normal mood and affect.

## 2011-07-22 NOTE — Patient Instructions (Signed)
Please have your lab work and ultrasound done. We will call with results and further recommendations. We will request copy of your colonoscopy report for review and determination for when you are due for your next colonoscopy.

## 2011-07-23 ENCOUNTER — Encounter: Payer: Self-pay | Admitting: Gastroenterology

## 2011-07-23 NOTE — Assessment & Plan Note (Addendum)
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 Seen at [facility] liver clinic. No planned follow-up. Xifaxan added to see if it would help mental status/lethargy. Advised to minimize narcotics and other sedating medications.   Patient is on propanolol now for portal HTN. Next EGD 2014. Due AFP, abd u/s now. He has one more shot to complete hep a/b vaccines.   Obtain updated labs. Check ammonia level as it is not clear if lethargy all secondary to hepatic encephalopathy. Will discuss further with Dr. Jena Gauss.

## 2011-07-23 NOTE — Assessment & Plan Note (Signed)
Patient reports h/o getting colonoscopy every two years due to h/o adenomatous polyps and FH colon cancer, 1st degree relative. Recent ferritin, ifobt unremarkable. No clear indication for TCS now. Request records of last two TCS and path as well as last OV notes.

## 2011-07-26 ENCOUNTER — Telehealth: Payer: Self-pay | Admitting: Gastroenterology

## 2011-07-26 NOTE — Telephone Encounter (Signed)
Pt scheduled for [place] on 02/01 @ 8- spoke with pts wife and informed her of this along with instructions

## 2011-07-26 NOTE — Progress Notes (Signed)
Faxed to PCP

## 2011-07-30 ENCOUNTER — Ambulatory Visit (HOSPITAL_COMMUNITY)
Admission: RE | Admit: 2011-07-30 | Discharge: 2011-07-30 | Disposition: A | Discharge status: Home / Self Care | Payer: Medicare Other | Source: Ambulatory Visit | Attending: Gastroenterology | Admitting: Gastroenterology

## 2011-07-30 DIAGNOSIS — E119 Type 2 diabetes mellitus without complications: Secondary | ICD-10-CM | POA: Diagnosis not present

## 2011-07-30 DIAGNOSIS — R161 Splenomegaly, not elsewhere classified: Secondary | ICD-10-CM | POA: Diagnosis not present

## 2011-07-30 DIAGNOSIS — K7460 Unspecified cirrhosis of liver: Secondary | ICD-10-CM | POA: Diagnosis not present

## 2011-07-30 DIAGNOSIS — Z800 Family history of malignant neoplasm of digestive organs: Secondary | ICD-10-CM | POA: Insufficient documentation

## 2011-07-30 DIAGNOSIS — E7800 Pure hypercholesterolemia, unspecified: Secondary | ICD-10-CM | POA: Diagnosis not present

## 2011-07-30 DIAGNOSIS — I10 Essential (primary) hypertension: Secondary | ICD-10-CM | POA: Diagnosis not present

## 2011-07-30 DIAGNOSIS — R160 Hepatomegaly, not elsewhere classified: Secondary | ICD-10-CM | POA: Diagnosis not present

## 2011-07-30 DIAGNOSIS — K635 Polyp of colon: Secondary | ICD-10-CM

## 2011-07-30 NOTE — Progress Notes (Signed)
Quick Note:  U/s stable. No liver tumor. See lab report. ______

## 2011-07-30 NOTE — Progress Notes (Signed)
Quick Note:  MELD 6. Alk phos/ast/alt stable. abd u/s stable without tumor but evidence of cirrhosis. Ammonia elevated but improved.  Discussed with Dr. Jena Gauss.  Lethargy/confusion may in part be due to liver but concerned there may be something else going on.  Increase lactulose for patient to have 3 BMs daily. Add additional dose 15cc initially. Titrate up as needed. Minimize use of pain medications as they filter to the liver and may be adding to sedation. RMR recommends patient be seen at [facility] to see if anything else contributing to lethargy/confusion/sedation. We are still waiting on records from [facility] regarding last tcs.  Recheck abd u/s, afp six months. Recheck cmet, inr, cbc 3 months. Keep ov with RMR 08/2011!!!  I TRIED TO CALL PATIENT AND LMOAM FOR RETURN CALL. PLEASE GIVE ABOVE INFO BUT IF THEY WANT TO SPEAK WITH ME I WOULD BE GLAD TO. THANKS. ______

## 2011-08-02 ENCOUNTER — Other Ambulatory Visit: Payer: Self-pay | Admitting: Gastroenterology

## 2011-08-02 DIAGNOSIS — K7460 Unspecified cirrhosis of liver: Secondary | ICD-10-CM

## 2011-08-02 NOTE — Progress Notes (Signed)
Notes were received an have been sent to be scanned

## 2011-08-09 ENCOUNTER — Other Ambulatory Visit: Payer: Self-pay

## 2011-08-09 MED ORDER — LACTULOSE 10 GM/15ML PO SOLN: 10.0000 g | Freq: Two times a day (BID) | ORAL | Status: DC

## 2011-08-16 DIAGNOSIS — F411 Generalized anxiety disorder: Secondary | ICD-10-CM | POA: Diagnosis not present

## 2011-08-16 DIAGNOSIS — J0100 Acute maxillary sinusitis, unspecified: Secondary | ICD-10-CM | POA: Diagnosis not present

## 2011-08-28 ENCOUNTER — Other Ambulatory Visit (HOSPITAL_COMMUNITY): Payer: Self-pay | Admitting: Oncology

## 2011-08-30 ENCOUNTER — Telehealth: Payer: Self-pay

## 2011-08-30 MED ORDER — LACTULOSE 10 GM/15ML PO SOLN: ORAL | Status: DC

## 2011-08-30 NOTE — Telephone Encounter (Signed)
pts wife called- she is running out of lactulose for pt. The rx on the bottle says to give 15cc and she usually gives him 20cc, because that is the amount that she found works best for him. She cannot get it refilled until next Monday. She is wanting to know if we can send in new rx to [facility] in [place]. Please advise.

## 2011-09-03 ENCOUNTER — Encounter: Payer: Self-pay | Admitting: Internal Medicine

## 2011-09-03 ENCOUNTER — Ambulatory Visit (INDEPENDENT_AMBULATORY_CARE_PROVIDER_SITE_OTHER): Payer: Medicare Other | Admitting: Internal Medicine

## 2011-09-03 VITALS — BP 107/72 | HR 93 | Temp 98.7°F | Ht 67.0 in | Wt 160.8 lb

## 2011-09-03 DIAGNOSIS — K7682 Hepatic encephalopathy: Secondary | ICD-10-CM

## 2011-09-03 DIAGNOSIS — K635 Polyp of colon: Secondary | ICD-10-CM

## 2011-09-03 DIAGNOSIS — K7290 Hepatic failure, unspecified without coma: Secondary | ICD-10-CM | POA: Diagnosis not present

## 2011-09-03 DIAGNOSIS — K7460 Unspecified cirrhosis of liver: Secondary | ICD-10-CM

## 2011-09-03 DIAGNOSIS — D126 Benign neoplasm of colon, unspecified: Secondary | ICD-10-CM | POA: Diagnosis not present
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
## 2011-09-03 NOTE — Patient Instructions (Signed)
Mr. Unrein is taking multiple medications which could affect his mental status and ability to think clearly.   Please stop Ativan  Stop Flexeril  Stop Fioricet  Stop Phenergan  Stop hydrocodone  Resume Xifaxan 550 mg orally twice daily-we have provided samples.   Titrate lactulose to 3 semi-formed bowel movements daily.  May use 325 mg Tylenol tablets in place of hydrocodone but need to keep in mind that your Fioricet and hydrocodone contain Tylenol. Patient cannot take more than 2 g of Tylenol daily - total.  Office visit with [place] in 3 weeks.  Would not stop the above medications all at once. Would taper down on the use of these medications over the next 2-3 weeks.  He may yet need to see the neurologist.  Office visit here in 3 weeks.

## 2011-09-03 NOTE — Assessment & Plan Note (Signed)
I believe the patient is experiencing symptoms of hepatic/toxic metabolic encephalopathy. Polypharmacy likely contributing to his impaired mental status. Hydrocodone contributing to relative constipation. My concerns were discussed with the wife at length. It'll be difficult to get him off of some of these medications but this is what needs to be done if we are to improve on his mental status.  Recommendations:   See patient instructions. Wean off and stop  medications as outlined in patient's instructions. May use acetaminophen for analgesia but not to exceed a total dose of 2 g daily. Titrate lactulose to 3 semi-formed stools daily. Plan to Taper culprit medications over the coming weeks.  We'll plan to see him back in the office in 3 weeks. He may yet need a neurology consultation. We may also need assistance from a pain management physician.  We'll consider surveillance colonoscopy later in the year once his mental status has been optimized. Hospital long time answering multiple questions which the wife had for me today.

## 2011-09-08 ENCOUNTER — Telehealth: Payer: Self-pay

## 2011-09-08 NOTE — Telephone Encounter (Signed)
pts wife called- she is needing help with getting pt off of his medications- she said he has been on most of them for over 18 years. The hydrocodone is for the degenerative disc disease in his back and the fioricet is for complex migraines and he doesn't take them everyday, only when he has a migraine. She is not sure if he will be able to cope with the back pain.   She has tried to decrease the amount of meds since Friday and he has already had 2 episodes of paranoia and crying.   She also got information that he has been referred to [facility]. She said they have only lived here for 2 years and she has no idea how to get to [place]. She wants to know if there is anyplace in [place] that she can take him to? She is scared to drive in [place].   She was crying on the phone and said she didn't know what to do, he is her whole life. I spoke with KJ and LSL- they advised have her give pt meds today and get further recommendations from RMR. Informed pts wife and told her that I would inform RMR and get further recommendations and call her back as soon as I can. She is aware it may be tomorrow before I can call her back.   Please advise.

## 2011-09-09 NOTE — Telephone Encounter (Signed)
I called Dr. [name] yesterday. He was off. Patient will likely best be served by getting a detoxification program. As long as he is taking multiple CNS active medications he will likely continue to have an abnormal mental status in the setting of cirrhosis.

## 2011-09-09 NOTE — Telephone Encounter (Signed)
Left message with details on answering machine. Informed her that we would be in touch after RMR speaks with Dr. [name].

## 2011-09-14 ENCOUNTER — Telehealth: Payer: Self-pay | Admitting: Internal Medicine

## 2011-09-14 NOTE — Telephone Encounter (Signed)
I called Dr. [name] today and reviewed the case with him. I am quite concerned about polypharmacy-particularly hydrocodone, Ativan and Phenergan. Patient would benefit from potentially inpatient drug detoxification. It will likely take months to wean him off meds( or at least get him down to a significantly lower dose of multiple medications). Dr. [name] will make arrangements to get him back in the office in the near future and start the process. I will make plans to get him back in the office in the coming weeks.  Please let patient's wife know.

## 2011-09-14 NOTE — Telephone Encounter (Signed)
Tried to call pts wife- LM with details on voicemail. Asked her to call back if she has any questions.

## 2011-09-20 ENCOUNTER — Telehealth: Payer: Self-pay

## 2011-09-20 MED ORDER — LACTULOSE 10 GM/15ML PO SOLN: ORAL | Status: DC

## 2011-09-20 NOTE — Telephone Encounter (Signed)
Please fax to [facility]. RX won't send electronically due to format Thanks

## 2011-09-23 ENCOUNTER — Ambulatory Visit: Payer: Medicare Other | Admitting: Gastroenterology

## 2011-09-27 DIAGNOSIS — G894 Chronic pain syndrome: Secondary | ICD-10-CM | POA: Diagnosis not present

## 2011-09-27 DIAGNOSIS — I10 Essential (primary) hypertension: Secondary | ICD-10-CM | POA: Diagnosis not present

## 2011-09-27 DIAGNOSIS — K7460 Unspecified cirrhosis of liver: Secondary | ICD-10-CM | POA: Diagnosis not present

## 2011-09-27 DIAGNOSIS — E109 Type 1 diabetes mellitus without complications: Secondary | ICD-10-CM | POA: Diagnosis not present

## 2011-10-18 DIAGNOSIS — R1011 Right upper quadrant pain: Secondary | ICD-10-CM | POA: Diagnosis not present

## 2011-10-18 DIAGNOSIS — K7689 Other specified diseases of liver: Secondary | ICD-10-CM | POA: Diagnosis not present

## 2011-10-18 DIAGNOSIS — R109 Unspecified abdominal pain: Secondary | ICD-10-CM | POA: Diagnosis not present

## 2011-10-25 ENCOUNTER — Other Ambulatory Visit: Payer: Self-pay

## 2011-10-25 DIAGNOSIS — K7460 Unspecified cirrhosis of liver: Secondary | ICD-10-CM | POA: Diagnosis not present

## 2011-11-01 DIAGNOSIS — K7460 Unspecified cirrhosis of liver: Secondary | ICD-10-CM | POA: Diagnosis not present

## 2011-11-01 LAB — COMPREHENSIVE METABOLIC PANEL
ALT: 29 U/L (ref 0–53)
AST: 52 U/L — ABNORMAL HIGH (ref 0–37)
Albumin: 4.9 g/dL (ref 3.5–5.2)
Alkaline Phosphatase: 121 U/L — ABNORMAL HIGH (ref 39–117)
BUN: 14 mg/dL (ref 6–23)
CO2: 21 mEq/L (ref 19–32)
Calcium: 10.5 mg/dL (ref 8.4–10.5)
Chloride: 101 mEq/L (ref 96–112)
Creat: 1.27 mg/dL (ref 0.50–1.35)
Glucose, Bld: 170 mg/dL — ABNORMAL HIGH (ref 70–99)
Potassium: 4.8 mEq/L (ref 3.5–5.3)
Sodium: 133 mEq/L — ABNORMAL LOW (ref 135–145)
Total Bilirubin: 0.6 mg/dL (ref 0.3–1.2)
Total Protein: 8.4 g/dL — ABNORMAL HIGH (ref 6.0–8.3)

## 2011-11-01 LAB — CBC WITH DIFFERENTIAL/PLATELET
HCT: 40.3 % (ref 39.0–52.0)
Hemoglobin: 12.6 g/dL — ABNORMAL LOW (ref 13.0–17.0)
Lymphocytes Relative: 26 % (ref 12–46)
Monocytes Absolute: 0.3 10*3/uL (ref 0.1–1.0)
Monocytes Relative: 5 % (ref 3–12)
Neutro Abs: 3.6 10*3/uL (ref 1.7–7.7)
Neutrophils Relative %: 66 % (ref 43–77)
RBC: 4.09 MIL/uL — ABNORMAL LOW (ref 4.22–5.81)
WBC: 5.4 10*3/uL (ref 4.0–10.5)

## 2011-11-01 LAB — PROTIME-INR
INR: 1.08 (ref <1.50)
Prothrombin Time: 14.4 seconds (ref 11.6–15.2)

## 2011-11-04 DIAGNOSIS — Z79899 Other long term (current) drug therapy: Secondary | ICD-10-CM | POA: Diagnosis not present

## 2011-11-04 DIAGNOSIS — Z5181 Encounter for therapeutic drug level monitoring: Secondary | ICD-10-CM | POA: Diagnosis not present

## 2011-11-08 NOTE — Progress Notes (Signed)
Quick Note:  MELD 8. Labs stable. Patient cancelled appt in 08/2011.  Recommend rescheduling appt with RMR only. ______

## 2011-11-09 NOTE — Progress Notes (Signed)
Quick Note:  pts wife patricia aware.  Darl Pikes, please schedule ov with RMR ______

## 2011-11-15 ENCOUNTER — Encounter: Payer: Self-pay | Admitting: Internal Medicine

## 2011-11-17 DIAGNOSIS — Z5181 Encounter for therapeutic drug level monitoring: Secondary | ICD-10-CM | POA: Diagnosis not present

## 2011-11-17 DIAGNOSIS — Z79899 Other long term (current) drug therapy: Secondary | ICD-10-CM | POA: Diagnosis not present

## 2011-12-06 ENCOUNTER — Other Ambulatory Visit: Payer: Self-pay

## 2011-12-06 MED ORDER — LACTULOSE 10 GM/15ML PO SOLN: ORAL | Status: DC

## 2011-12-17 ENCOUNTER — Ambulatory Visit (INDEPENDENT_AMBULATORY_CARE_PROVIDER_SITE_OTHER): Payer: Medicare Other | Admitting: Internal Medicine

## 2011-12-17 ENCOUNTER — Encounter: Payer: Self-pay | Admitting: Internal Medicine

## 2011-12-17 VITALS — BP 105/69 | HR 91 | Temp 98.7°F | Ht 69.0 in | Wt 154.0 lb

## 2011-12-17 DIAGNOSIS — K7689 Other specified diseases of liver: Secondary | ICD-10-CM | POA: Diagnosis not present

## 2011-12-17 DIAGNOSIS — Z860101 Personal history of adenomatous and serrated colon polyps: Secondary | ICD-10-CM

## 2011-12-17 DIAGNOSIS — Z86010 Personal history of colonic polyps: Secondary | ICD-10-CM | POA: Diagnosis not present

## 2011-12-17 DIAGNOSIS — K7460 Unspecified cirrhosis of liver: Secondary | ICD-10-CM

## 2011-12-17 DIAGNOSIS — K7682 Hepatic encephalopathy: Secondary | ICD-10-CM

## 2011-12-17 DIAGNOSIS — K7290 Hepatic failure, unspecified without coma: Secondary | ICD-10-CM | POA: Diagnosis not present

## 2011-12-17 DIAGNOSIS — K7581 Nonalcoholic steatohepatitis (NASH): Secondary | ICD-10-CM
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
## 2011-12-17 NOTE — Patient Instructions (Addendum)
Continue lactulose  Continue to minimize lorazapam and pain medications  Ultrasound and AFP  In 2 months  Increase propranalol to 20 mg twice daily   As discussed, with history of recurrent encephalopathy, you should not drive.  Office visit with [place] in 2 months -  At that time we will schedule a surveillance colonoscopy

## 2011-12-20 ENCOUNTER — Other Ambulatory Visit: Payer: Self-pay

## 2011-12-20 DIAGNOSIS — K7460 Unspecified cirrhosis of liver: Secondary | ICD-10-CM

## 2011-12-23 DIAGNOSIS — R5383 Other fatigue: Secondary | ICD-10-CM | POA: Diagnosis not present

## 2011-12-23 DIAGNOSIS — K7460 Unspecified cirrhosis of liver: Secondary | ICD-10-CM | POA: Diagnosis not present

## 2011-12-23 DIAGNOSIS — R209 Unspecified disturbances of skin sensation: Secondary | ICD-10-CM | POA: Diagnosis not present

## 2011-12-23 DIAGNOSIS — R109 Unspecified abdominal pain: Secondary | ICD-10-CM | POA: Diagnosis not present

## 2011-12-23 DIAGNOSIS — R5381 Other malaise: Secondary | ICD-10-CM | POA: Diagnosis not present

## 2011-12-27 DIAGNOSIS — E119 Type 2 diabetes mellitus without complications: Secondary | ICD-10-CM | POA: Diagnosis not present

## 2011-12-27 DIAGNOSIS — I10 Essential (primary) hypertension: Secondary | ICD-10-CM | POA: Diagnosis not present

## 2011-12-27 DIAGNOSIS — R5381 Other malaise: Secondary | ICD-10-CM | POA: Diagnosis not present

## 2011-12-27 DIAGNOSIS — K7460 Unspecified cirrhosis of liver: Secondary | ICD-10-CM | POA: Diagnosis not present

## 2012-01-06 ENCOUNTER — Other Ambulatory Visit: Payer: Self-pay

## 2012-01-06 DIAGNOSIS — D696 Thrombocytopenia, unspecified: Secondary | ICD-10-CM

## 2012-01-06 DIAGNOSIS — K7460 Unspecified cirrhosis of liver: Secondary | ICD-10-CM

## 2012-01-11 ENCOUNTER — Other Ambulatory Visit (HOSPITAL_COMMUNITY): Payer: Self-pay | Admitting: Internal Medicine

## 2012-01-11 DIAGNOSIS — R109 Unspecified abdominal pain: Secondary | ICD-10-CM

## 2012-01-13 ENCOUNTER — Ambulatory Visit (HOSPITAL_COMMUNITY)
Admission: RE | Admit: 2012-01-13 | Discharge: 2012-01-13 | Disposition: A | Discharge status: Home / Self Care | Payer: Medicare Other | Source: Ambulatory Visit | Attending: Internal Medicine | Admitting: Internal Medicine

## 2012-01-13 DIAGNOSIS — R161 Splenomegaly, not elsewhere classified: Secondary | ICD-10-CM | POA: Diagnosis not present

## 2012-01-13 DIAGNOSIS — R935 Abnormal findings on diagnostic imaging of other abdominal regions, including retroperitoneum: Secondary | ICD-10-CM | POA: Diagnosis not present

## 2012-01-13 DIAGNOSIS — K7689 Other specified diseases of liver: Secondary | ICD-10-CM | POA: Diagnosis not present

## 2012-01-13 DIAGNOSIS — R911 Solitary pulmonary nodule: Secondary | ICD-10-CM | POA: Diagnosis not present

## 2012-01-13 DIAGNOSIS — K7460 Unspecified cirrhosis of liver: Secondary | ICD-10-CM | POA: Diagnosis not present

## 2012-01-13 DIAGNOSIS — R1013 Epigastric pain: Secondary | ICD-10-CM | POA: Diagnosis not present

## 2012-01-13 DIAGNOSIS — R1012 Left upper quadrant pain: Secondary | ICD-10-CM | POA: Insufficient documentation

## 2012-01-13 DIAGNOSIS — R109 Unspecified abdominal pain: Secondary | ICD-10-CM

## 2012-01-13 MED ORDER — IOHEXOL 300 MG/ML  SOLN
100.0000 mL | Freq: Once | INTRAMUSCULAR | Status: AC | PRN
  Administered 2012-01-13: 100 mL via INTRAVENOUS

## 2012-01-14 LAB — POCT I-STAT, CHEM 8
Calcium, Ion: 1.25 mmol/L (ref 1.13–1.30)
Chloride: 108 mEq/L (ref 96–112)
HCT: 42 % (ref 39.0–52.0)
Sodium: 139 mEq/L (ref 135–145)
TCO2: 19 mmol/L (ref 0–100)

## 2012-01-25 ENCOUNTER — Telehealth: Payer: Self-pay | Admitting: Internal Medicine

## 2012-01-25 ENCOUNTER — Encounter: Payer: Self-pay | Admitting: Internal Medicine

## 2012-01-25 DIAGNOSIS — K7460 Unspecified cirrhosis of liver: Secondary | ICD-10-CM | POA: Diagnosis not present

## 2012-01-25 LAB — AFP TUMOR MARKER: AFP-Tumor Marker: 8.3 ng/mL — ABNORMAL HIGH (ref 0.0–8.0)

## 2012-01-25 NOTE — Telephone Encounter (Signed)
Lab order has been mailed to pt. 

## 2012-01-25 NOTE — Telephone Encounter (Signed)
Ultrasound in 6 months and AFP in 2 months and OV. This is on the August recall list

## 2012-01-26 ENCOUNTER — Other Ambulatory Visit: Payer: Self-pay | Admitting: Gastroenterology

## 2012-01-26 DIAGNOSIS — K7460 Unspecified cirrhosis of liver: Secondary | ICD-10-CM

## 2012-01-26 NOTE — Telephone Encounter (Signed)
Scheduled for Abd U/S on August 12th at 8:00 arrive at 7:45 NPO after midnight and patients wife is aware.

## 2012-02-01 ENCOUNTER — Encounter (HOSPITAL_COMMUNITY): Payer: Medicare Other | Attending: Oncology

## 2012-02-01 DIAGNOSIS — D696 Thrombocytopenia, unspecified: Secondary | ICD-10-CM | POA: Insufficient documentation

## 2012-02-01 DIAGNOSIS — E119 Type 2 diabetes mellitus without complications: Secondary | ICD-10-CM | POA: Insufficient documentation

## 2012-02-01 DIAGNOSIS — K766 Portal hypertension: Secondary | ICD-10-CM | POA: Insufficient documentation

## 2012-02-01 DIAGNOSIS — K7460 Unspecified cirrhosis of liver: Secondary | ICD-10-CM | POA: Diagnosis not present

## 2012-02-01 DIAGNOSIS — I10 Essential (primary) hypertension: Secondary | ICD-10-CM | POA: Insufficient documentation

## 2012-02-01 DIAGNOSIS — R161 Splenomegaly, not elsewhere classified: Secondary | ICD-10-CM | POA: Diagnosis not present

## 2012-02-01 DIAGNOSIS — Z8673 Personal history of transient ischemic attack (TIA), and cerebral infarction without residual deficits: Secondary | ICD-10-CM | POA: Diagnosis not present

## 2012-02-01 DIAGNOSIS — I8500 Esophageal varices without bleeding: Secondary | ICD-10-CM | POA: Insufficient documentation

## 2012-02-01 LAB — CBC
Platelets: 98 10*3/uL — ABNORMAL LOW (ref 150–400)
RBC: 4.11 MIL/uL — ABNORMAL LOW (ref 4.22–5.81)
RDW: 14.4 % (ref 11.5–15.5)
WBC: 4.5 10*3/uL (ref 4.0–10.5)

## 2012-02-01 NOTE — Progress Notes (Signed)
Labs drawn today for cbc 

## 2012-02-02 ENCOUNTER — Encounter: Payer: Self-pay | Admitting: Gastroenterology

## 2012-02-02 ENCOUNTER — Other Ambulatory Visit: Payer: Self-pay | Admitting: Internal Medicine

## 2012-02-02 ENCOUNTER — Ambulatory Visit (INDEPENDENT_AMBULATORY_CARE_PROVIDER_SITE_OTHER): Payer: Medicare Other | Admitting: Gastroenterology

## 2012-02-02 VITALS — BP 108/71 | HR 93 | Temp 98.2°F | Ht 62.0 in | Wt 155.2 lb

## 2012-02-02 DIAGNOSIS — Z86010 Personal history of colonic polyps: Secondary | ICD-10-CM | POA: Diagnosis not present

## 2012-02-02 DIAGNOSIS — K7460 Unspecified cirrhosis of liver: Secondary | ICD-10-CM

## 2012-02-02 DIAGNOSIS — D369 Benign neoplasm, unspecified site: Secondary | ICD-10-CM

## 2012-02-02 DIAGNOSIS — K7290 Hepatic failure, unspecified without coma: Secondary | ICD-10-CM
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
## 2012-02-02 NOTE — Patient Instructions (Addendum)
We have set you up for a colonoscopy with Dr. Jena Gauss.   We will repeat the AFP (blood test) in 3 months.   Follow-up in 3 months.

## 2012-02-04 ENCOUNTER — Encounter (HOSPITAL_BASED_OUTPATIENT_CLINIC_OR_DEPARTMENT_OTHER): Payer: Medicare Other | Admitting: Oncology

## 2012-02-04 ENCOUNTER — Other Ambulatory Visit: Payer: Self-pay | Admitting: Internal Medicine

## 2012-02-04 ENCOUNTER — Encounter (HOSPITAL_COMMUNITY): Payer: Self-pay | Admitting: Oncology

## 2012-02-04 VITALS — BP 102/72 | HR 85 | Temp 98.4°F | Resp 20 | Wt 154.7 lb

## 2012-02-04 DIAGNOSIS — D696 Thrombocytopenia, unspecified: Secondary | ICD-10-CM

## 2012-02-04 DIAGNOSIS — R161 Splenomegaly, not elsewhere classified: Secondary | ICD-10-CM

## 2012-02-04 DIAGNOSIS — K7460 Unspecified cirrhosis of liver: Secondary | ICD-10-CM

## 2012-02-04 HISTORY — DX: Thrombocytopenia, unspecified: D69.6

## 2012-02-04 MED ORDER — PEG-KCL-NACL-NASULF-NA ASC-C 100 G PO SOLR: 1.0000 | ORAL | Status: DC
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
## 2012-02-04 NOTE — Patient Instructions (Signed)
[facility]  Discharge Instructions [place] SHOWERS  [id number] [birth year] Dr. Glenford Peers  RECOMMENDATIONS MADE BY THE CONSULTANT AND ANY TEST RESULTS WILL BE SENT TO YOUR REFERRING DOCTOR.   EXAM FINDINGS BY MD TODAY AND SIGNS AND SYMPTOMS TO REPORT TO CLINIC OR PRIMARY MD:  Exam is good   INSTRUCTIONS GIVEN AND DISCUSSED: Labs in 6 months  SPECIAL INSTRUCTIONS/FOLLOW-UP: See Dr. Mariel Sleet in 6 months   I acknowledge that I have been informed and understand all the instructions given to me and received a copy. I do not have any more questions at this time, but understand that I may call the Specialty Clinic at [facility] at [phone] during business hours should I have any further questions or need assistance in obtaining follow-up care.    __________________________________________  _____________  __________ Signature of Patient or Authorized Representative            Date                   Time    __________________________________________ Nurse's Signature

## 2012-02-05 ENCOUNTER — Encounter: Payer: Self-pay | Admitting: Gastroenterology

## 2012-02-05 NOTE — Assessment & Plan Note (Signed)
Last TCS in 2010, outside facility. No lower GI symptoms currently. +FH of colon cancer in first degree relative. Proceed with surveillance colonoscopy with Dr. Jena Gauss. The risks, benefits, and alternatives have been discussed in detail with the patient and wife. Utilize Phenergan 25 mg IV for procedure due to chronic narcotic use.

## 2012-02-05 NOTE — Progress Notes (Unsigned)
Please send most recent CT report to PCP for review. Lung nodule noted on CT. PCP will need to determine appropriate follow-up for this. Thanks!

## 2012-02-05 NOTE — Assessment & Plan Note (Signed)
Well-compensated at this time. HCC screening up-to-date. July AFP only marginally elevated as in HPI. Recheck in 3 mos. CT without evidence of HCC. Send report to PCP to follow-up on lung nodule. Titrate lactulose to achieve 3 soft BMs per day. Next surveillance EGD Aug 2014.

## 2012-02-05 NOTE — Assessment & Plan Note (Signed)
Improved. Likely worsened by multiple medications. Much improvement since cessation of multiple meds, seeing pain management. Continue Lactulose. Unable to afford Xifaxan currently.

## 2012-02-06 NOTE — Progress Notes (Signed)
Mild elevation in alpha-fetoprotein noted. If elevated further in 3 months, would consider MRI of the liver at that time.

## 2012-02-07 ENCOUNTER — Other Ambulatory Visit (HOSPITAL_COMMUNITY): Payer: Medicare Other

## 2012-02-07 NOTE — Progress Notes (Signed)
Faxed to PCP

## 2012-02-08 ENCOUNTER — Other Ambulatory Visit: Payer: Self-pay | Admitting: Internal Medicine

## 2012-02-08 DIAGNOSIS — K7460 Unspecified cirrhosis of liver: Secondary | ICD-10-CM

## 2012-02-08 NOTE — Progress Notes (Signed)
Quick Note:  Per RMR (see addendum to AS OV note).  AFP in 3 months. If further elevated, then MRI liver. ______

## 2012-02-11 DIAGNOSIS — G894 Chronic pain syndrome: Secondary | ICD-10-CM | POA: Diagnosis not present

## 2012-02-11 DIAGNOSIS — M533 Sacrococcygeal disorders, not elsewhere classified: Secondary | ICD-10-CM | POA: Diagnosis not present

## 2012-02-14 ENCOUNTER — Other Ambulatory Visit: Payer: Self-pay

## 2012-02-15 MED ORDER — LACTULOSE 10 GM/15ML PO SOLN: 20.0000 g | Freq: Two times a day (BID) | ORAL | Status: DC

## 2012-02-16 ENCOUNTER — Encounter (HOSPITAL_COMMUNITY): Payer: Self-pay | Admitting: Pharmacy Technician

## 2012-02-23 ENCOUNTER — Telehealth: Payer: Self-pay

## 2012-02-23 NOTE — Telephone Encounter (Signed)
pts wife called- she had some questions about pts medications while he is doing his prep next week for his tcs. I checked his instructions for his procedure and the only thing it says is to hold his DM meds the day of the procedure. tcs is scheduled for 03/02/12.  Does pt need to change his DM meds the day before procedure? Do you want pt to hold his iron 7 days prior to procedure? Wife wants to know if she can give insulin per sliding scale the day before procedure? She said she checks his blood sugar 3x a day.   Please advise.

## 2012-02-23 NOTE — Telephone Encounter (Signed)
Wife understands the medication will stay the same on the day before test.

## 2012-02-23 NOTE — Telephone Encounter (Signed)
Glucophage should be fine the day before to take as normal.  Can still do insulin sliding scale as normal the day before.  Yes, hold iron 7 days prior.

## 2012-03-01 MED ORDER — SODIUM CHLORIDE 0.45 % IV SOLN
INTRAVENOUS | Status: DC
  Administered 2012-03-02: 09:00:00 via INTRAVENOUS

## 2012-03-01 MED ORDER — SODIUM CHLORIDE 0.9 % IV SOLN: INTRAVENOUS | Status: DC

## 2012-03-02 ENCOUNTER — Encounter (HOSPITAL_COMMUNITY): Payer: Self-pay | Admitting: *Deleted

## 2012-03-02 ENCOUNTER — Encounter (HOSPITAL_COMMUNITY)
Admission: RE | Disposition: A | Discharge status: Home / Self Care | Payer: Self-pay | Source: Ambulatory Visit | Attending: Internal Medicine

## 2012-03-02 ENCOUNTER — Ambulatory Visit (HOSPITAL_COMMUNITY)
Admission: RE | Admit: 2012-03-02 | Discharge: 2012-03-02 | Disposition: A | Discharge status: Home / Self Care | Payer: Medicare Other | Source: Ambulatory Visit | Attending: Internal Medicine | Admitting: Internal Medicine

## 2012-03-02 DIAGNOSIS — Z800 Family history of malignant neoplasm of digestive organs: Secondary | ICD-10-CM | POA: Insufficient documentation

## 2012-03-02 DIAGNOSIS — Z860100 Personal history of colon polyps, unspecified: Secondary | ICD-10-CM | POA: Insufficient documentation

## 2012-03-02 DIAGNOSIS — I10 Essential (primary) hypertension: Secondary | ICD-10-CM | POA: Diagnosis not present

## 2012-03-02 DIAGNOSIS — E785 Hyperlipidemia, unspecified: Secondary | ICD-10-CM | POA: Insufficient documentation

## 2012-03-02 DIAGNOSIS — E119 Type 2 diabetes mellitus without complications: Secondary | ICD-10-CM | POA: Insufficient documentation

## 2012-03-02 DIAGNOSIS — Z1211 Encounter for screening for malignant neoplasm of colon: Secondary | ICD-10-CM | POA: Diagnosis not present

## 2012-03-02 DIAGNOSIS — K5730 Diverticulosis of large intestine without perforation or abscess without bleeding: Secondary | ICD-10-CM | POA: Insufficient documentation

## 2012-03-02 DIAGNOSIS — Z01812 Encounter for preprocedural laboratory examination: Secondary | ICD-10-CM | POA: Diagnosis not present

## 2012-03-02 DIAGNOSIS — D369 Benign neoplasm, unspecified site: Secondary | ICD-10-CM

## 2012-03-02 DIAGNOSIS — D126 Benign neoplasm of colon, unspecified: Secondary | ICD-10-CM | POA: Diagnosis not present

## 2012-03-02 DIAGNOSIS — K7460 Unspecified cirrhosis of liver: Secondary | ICD-10-CM

## 2012-03-02 HISTORY — PX: COLONOSCOPY: SHX5424

## 2012-03-02 LAB — GLUCOSE, CAPILLARY: Glucose-Capillary: 103 mg/dL — ABNORMAL HIGH (ref 70–99)

## 2012-03-02 SURGERY — COLONOSCOPY
Anesthesia: Moderate Sedation

## 2012-03-02 MED ORDER — MEPERIDINE HCL 100 MG/ML IJ SOLN
INTRAMUSCULAR | Status: DC | PRN
  Administered 2012-03-02 (×2): 25 mg via INTRAVENOUS

## 2012-03-02 MED ORDER — SODIUM CHLORIDE 0.9 % IJ SOLN
INTRAMUSCULAR | Status: AC
  Filled 2012-03-02: qty 10

## 2012-03-02 MED ORDER — MIDAZOLAM HCL 5 MG/5ML IJ SOLN
INTRAMUSCULAR | Status: DC | PRN
  Administered 2012-03-02 (×3): 1 mg via INTRAVENOUS

## 2012-03-02 MED ORDER — PROMETHAZINE HCL 25 MG/ML IJ SOLN
25.0000 mg | Freq: Once | INTRAMUSCULAR | Status: AC
  Administered 2012-03-02: 25 mg via INTRAVENOUS

## 2012-03-02 MED ORDER — MEPERIDINE HCL 100 MG/ML IJ SOLN
INTRAMUSCULAR | Status: AC
  Filled 2012-03-02: qty 2

## 2012-03-02 MED ORDER — MIDAZOLAM HCL 5 MG/5ML IJ SOLN
INTRAMUSCULAR | Status: AC
  Filled 2012-03-02: qty 10

## 2012-03-02 MED ORDER — STERILE WATER FOR IRRIGATION IR SOLN
Status: DC | PRN
  Administered 2012-03-02: 10:00:00

## 2012-03-02 MED ORDER — PROMETHAZINE HCL 25 MG/ML IJ SOLN
INTRAMUSCULAR | Status: AC
  Filled 2012-03-02: qty 1

## 2012-03-02 NOTE — Op Note (Signed)
[facility] [address] [place], [address]   COLONOSCOPY PROCEDURE REPORT  PATIENT: Brad Wall, Brad Wall  MR#:         [id number] BIRTHDATE: 02-Dec-1945 , 65  yrs. old GENDER: Male ENDOSCOPIST: R.  Roetta Sessions, MD FACP FACG REFERRED BY:  Gareth Morgan, M.D. PROCEDURE DATE:  03/02/2012 PROCEDURE:     colonoscopy with biopsy  INDICATIONS: history colonic adenoma and positive family history of colon cancer  INFORMED CONSENT:  The risks, benefits, alternatives and imponderables including but not limited to bleeding, perforation as well as the possibility of a missed lesion have been reviewed.  The potential for biopsy, lesion removal, etc. have also been discussed.  Questions have been answered.  All parties agreeable. Please see the history and physical in the medical record for more information.  MEDICATIONS: Versed 3 mg IV and Demerol 50 mg IV in divided dose  DESCRIPTION OF PROCEDURE:  After a digital rectal exam was performed, the EC-[id number] ([id number])  colonoscope was advanced from the anus through the rectum and colon to the area of the cecum, ileocecal valve and appendiceal orifice.  The cecum was deeply intubated.  These structures were well-seen and photographed for the record.  From the level of the cecum and ileocecal valve, the scope was slowly and cautiously withdrawn.  The mucosal surfaces were carefully surveyed utilizing scope tip deflection to facilitate fold flattening as needed.  The scope was pulled down into the rectum where a thorough examination including retroflexion was performed.    FINDINGS:  marginal to poor preparation. This compromised the examination. Normal rectum. Left-sided/ transverse diverticulosis. (2) Diminutive polyps in the mid sigmoid segment. The remainder of the colonic mucosa, otherwise, appeared normal however, poor preparation made exam much more difficult.  THERAPEUTIC / DIAGNOSTIC MANEUVERS PERFORMED:  the  above-mentioned polyps were cold biopsied/removed  COMPLICATIONS: none  CECAL WITHDRAWAL TIME:  11 minutes  IMPRESSION:  colonic diverticulosis. Colonic polyps-removed as described above  RECOMMENDATIONS: Follow up on pathology   _______________________________ eSigned:  R. Roetta Sessions, MD FACP [facility] 03/02/2012 10:33 AM   CC:

## 2012-03-02 NOTE — H&P (View-Only) (Signed)
Mild elevation in alpha-fetoprotein noted. If elevated further in 3 months, would consider MRI of the liver at that time. 

## 2012-03-02 NOTE — Interval H&P Note (Signed)
History and Physical Interval Note:  03/02/2012 9:51 AM  Brad Wall  has presented today for surgery, with the diagnosis of Adenomatous Polyps and Cirrhosis  The various methods of treatment have been discussed with the patient and family. After consideration of risks, benefits and other options for treatment, the patient has consented to  Procedure(s) (LRB) with comments: COLONOSCOPY (N/A) - 9:30/Please give Phenergan 25mg  IV 30 mins prior to procedure as a surgical intervention .  The patient's history has been reviewed, patient examined, no change in status, stable for surgery.  I have reviewed the patient's chart and labs.  Questions were answered to the patient's satisfaction.     [name]

## 2012-03-05 ENCOUNTER — Encounter: Payer: Self-pay | Admitting: Internal Medicine

## 2012-03-06 ENCOUNTER — Encounter: Payer: Self-pay | Admitting: *Deleted

## 2012-03-07 ENCOUNTER — Encounter (HOSPITAL_COMMUNITY): Payer: Self-pay | Admitting: Internal Medicine

## 2012-03-13 ENCOUNTER — Emergency Department (HOSPITAL_COMMUNITY): Payer: Medicare Other

## 2012-03-13 ENCOUNTER — Emergency Department (HOSPITAL_COMMUNITY)
Admission: EM | Admit: 2012-03-13 | Discharge: 2012-03-13 | Disposition: A | Discharge status: Home / Self Care | Payer: Medicare Other | Attending: Emergency Medicine | Admitting: Emergency Medicine

## 2012-03-13 ENCOUNTER — Encounter (HOSPITAL_COMMUNITY): Payer: Self-pay | Admitting: Emergency Medicine

## 2012-03-13 DIAGNOSIS — Z87891 Personal history of nicotine dependence: Secondary | ICD-10-CM | POA: Insufficient documentation

## 2012-03-13 DIAGNOSIS — K219 Gastro-esophageal reflux disease without esophagitis: Secondary | ICD-10-CM | POA: Diagnosis not present

## 2012-03-13 DIAGNOSIS — E785 Hyperlipidemia, unspecified: Secondary | ICD-10-CM | POA: Diagnosis not present

## 2012-03-13 DIAGNOSIS — F3289 Other specified depressive episodes: Secondary | ICD-10-CM | POA: Insufficient documentation

## 2012-03-13 DIAGNOSIS — Z8673 Personal history of transient ischemic attack (TIA), and cerebral infarction without residual deficits: Secondary | ICD-10-CM | POA: Diagnosis not present

## 2012-03-13 DIAGNOSIS — R109 Unspecified abdominal pain: Secondary | ICD-10-CM

## 2012-03-13 DIAGNOSIS — Z794 Long term (current) use of insulin: Secondary | ICD-10-CM | POA: Diagnosis not present

## 2012-03-13 DIAGNOSIS — Z880 Allergy status to penicillin: Secondary | ICD-10-CM | POA: Insufficient documentation

## 2012-03-13 DIAGNOSIS — F329 Major depressive disorder, single episode, unspecified: Secondary | ICD-10-CM | POA: Diagnosis not present

## 2012-03-13 DIAGNOSIS — R161 Splenomegaly, not elsewhere classified: Secondary | ICD-10-CM | POA: Insufficient documentation

## 2012-03-13 DIAGNOSIS — R160 Hepatomegaly, not elsewhere classified: Secondary | ICD-10-CM | POA: Diagnosis not present

## 2012-03-13 DIAGNOSIS — F411 Generalized anxiety disorder: Secondary | ICD-10-CM | POA: Diagnosis not present

## 2012-03-13 DIAGNOSIS — E119 Type 2 diabetes mellitus without complications: Secondary | ICD-10-CM | POA: Diagnosis not present

## 2012-03-13 DIAGNOSIS — I10 Essential (primary) hypertension: Secondary | ICD-10-CM | POA: Insufficient documentation

## 2012-03-13 DIAGNOSIS — R1012 Left upper quadrant pain: Secondary | ICD-10-CM | POA: Insufficient documentation

## 2012-03-13 LAB — URINALYSIS, ROUTINE W REFLEX MICROSCOPIC
Leukocytes, UA: NEGATIVE
Protein, ur: NEGATIVE mg/dL
Specific Gravity, Urine: 1.015 (ref 1.005–1.030)
Urobilinogen, UA: 0.2 mg/dL (ref 0.0–1.0)

## 2012-03-13 LAB — COMPREHENSIVE METABOLIC PANEL
AST: 45 U/L — ABNORMAL HIGH (ref 0–37)
Albumin: 3.8 g/dL (ref 3.5–5.2)
Alkaline Phosphatase: 145 U/L — ABNORMAL HIGH (ref 39–117)
BUN: 11 mg/dL (ref 6–23)
Chloride: 105 mEq/L (ref 96–112)
Creatinine, Ser: 0.94 mg/dL (ref 0.50–1.35)
Potassium: 4.4 mEq/L (ref 3.5–5.1)
Total Bilirubin: 0.3 mg/dL (ref 0.3–1.2)
Total Protein: 7.1 g/dL (ref 6.0–8.3)

## 2012-03-13 LAB — AMMONIA: Ammonia: 23 umol/L (ref 11–60)

## 2012-03-13 LAB — CBC WITH DIFFERENTIAL/PLATELET
Basophils Absolute: 0 10*3/uL (ref 0.0–0.1)
Basophils Relative: 1 % (ref 0–1)
Eosinophils Absolute: 0.1 10*3/uL (ref 0.0–0.7)
MCH: 31.8 pg (ref 26.0–34.0)
MCHC: 32.7 g/dL (ref 30.0–36.0)
Monocytes Relative: 7 % (ref 3–12)
Neutro Abs: 2.8 10*3/uL (ref 1.7–7.7)
Neutrophils Relative %: 63 % (ref 43–77)
RDW: 14 % (ref 11.5–15.5)

## 2012-03-13 LAB — PROTIME-INR
INR: 1.05 (ref 0.00–1.49)
Prothrombin Time: 13.9 seconds (ref 11.6–15.2)
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
## 2012-03-13 NOTE — ED Notes (Signed)
Pt c/o left upper abd pain x 5 hours upon palpating the area.

## 2012-03-29 DIAGNOSIS — Z23 Encounter for immunization: Secondary | ICD-10-CM | POA: Diagnosis not present

## 2012-03-29 DIAGNOSIS — E119 Type 2 diabetes mellitus without complications: Secondary | ICD-10-CM | POA: Diagnosis not present

## 2012-03-29 DIAGNOSIS — K7290 Hepatic failure, unspecified without coma: Secondary | ICD-10-CM | POA: Diagnosis not present

## 2012-03-29 DIAGNOSIS — I10 Essential (primary) hypertension: Secondary | ICD-10-CM | POA: Diagnosis not present

## 2012-03-29 DIAGNOSIS — E1149 Type 2 diabetes mellitus with other diabetic neurological complication: Secondary | ICD-10-CM | POA: Diagnosis not present

## 2012-03-29 DIAGNOSIS — E1142 Type 2 diabetes mellitus with diabetic polyneuropathy: Secondary | ICD-10-CM | POA: Diagnosis not present

## 2012-04-04 ENCOUNTER — Encounter: Payer: Self-pay | Admitting: *Deleted

## 2012-04-11 ENCOUNTER — Telehealth: Payer: Self-pay | Admitting: Internal Medicine

## 2012-04-11 NOTE — Telephone Encounter (Signed)
Has a question regarding Brad Wall skipping his medication on a day that he needs to come in to be seen by Dr. Jena Gauss Please advise?

## 2012-04-11 NOTE — Telephone Encounter (Signed)
Spoke with pt's wife- pt takes lactulose every morning and has bowel movements soon after taking. She is afraid he will have increased anxiety about having bm's while he is here or on the road to get here. Advised her to hold the lactulose until after the office visit. She agreed with this.

## 2012-04-12 ENCOUNTER — Other Ambulatory Visit: Payer: Self-pay

## 2012-04-12 DIAGNOSIS — K7460 Unspecified cirrhosis of liver: Secondary | ICD-10-CM

## 2012-05-15 DIAGNOSIS — I8390 Asymptomatic varicose veins of unspecified lower extremity: Secondary | ICD-10-CM | POA: Diagnosis not present

## 2012-05-15 DIAGNOSIS — K219 Gastro-esophageal reflux disease without esophagitis: Secondary | ICD-10-CM | POA: Diagnosis not present

## 2012-05-15 DIAGNOSIS — R110 Nausea: Secondary | ICD-10-CM | POA: Diagnosis not present

## 2012-05-19 ENCOUNTER — Other Ambulatory Visit: Payer: Self-pay

## 2012-05-19 ENCOUNTER — Encounter: Payer: Self-pay | Admitting: Internal Medicine

## 2012-05-19 ENCOUNTER — Other Ambulatory Visit: Payer: Self-pay | Admitting: Internal Medicine

## 2012-05-19 ENCOUNTER — Ambulatory Visit (INDEPENDENT_AMBULATORY_CARE_PROVIDER_SITE_OTHER): Payer: Medicare Other | Admitting: Internal Medicine

## 2012-05-19 VITALS — BP 101/66 | HR 91 | Temp 97.6°F | Ht 62.0 in | Wt 158.0 lb

## 2012-05-19 DIAGNOSIS — K7460 Unspecified cirrhosis of liver: Secondary | ICD-10-CM | POA: Diagnosis not present

## 2012-05-19 DIAGNOSIS — Z139 Encounter for screening, unspecified: Secondary | ICD-10-CM

## 2012-05-19 LAB — CREATININE, SERUM: Creat: 1.28 mg/dL (ref 0.50–1.35)
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
## 2012-05-19 NOTE — Patient Instructions (Addendum)
MRI of liver to evaluate RUQ pain / cirrhosis  (take 3 mg of Ativan 30 minutes prior to procedure)  Continue lactulose and Propranolol  Repeat CT of liver in July of 2014  See Dr. Romeo Apple for thumb  Further recommendations to follow

## 2012-05-29 ENCOUNTER — Ambulatory Visit (HOSPITAL_COMMUNITY)
Admission: RE | Admit: 2012-05-29 | Discharge: 2012-05-29 | Disposition: A | Discharge status: Home / Self Care | Payer: Medicare Other | Source: Ambulatory Visit | Attending: Internal Medicine | Admitting: Internal Medicine

## 2012-05-29 ENCOUNTER — Telehealth: Payer: Self-pay | Admitting: Internal Medicine

## 2012-05-29 DIAGNOSIS — K7460 Unspecified cirrhosis of liver: Secondary | ICD-10-CM | POA: Diagnosis not present

## 2012-05-29 DIAGNOSIS — R109 Unspecified abdominal pain: Secondary | ICD-10-CM | POA: Diagnosis not present

## 2012-05-29 DIAGNOSIS — R110 Nausea: Secondary | ICD-10-CM | POA: Insufficient documentation

## 2012-05-29 DIAGNOSIS — K766 Portal hypertension: Secondary | ICD-10-CM | POA: Diagnosis not present

## 2012-05-29 MED ORDER — GADOBENATE DIMEGLUMINE 529 MG/ML IV SOLN: 15.0000 mL | Freq: Once | INTRAVENOUS | Status: AC | PRN

## 2012-05-29 NOTE — Telephone Encounter (Signed)
Patient has an appointment to see Dr. Romeo Apple on Jan 7th at 11:15 and the patient is aware

## 2012-05-31 ENCOUNTER — Other Ambulatory Visit: Payer: Self-pay | Admitting: Internal Medicine

## 2012-05-31 DIAGNOSIS — K7460 Unspecified cirrhosis of liver: Secondary | ICD-10-CM

## 2012-06-13 DIAGNOSIS — K7460 Unspecified cirrhosis of liver: Secondary | ICD-10-CM | POA: Diagnosis not present

## 2012-06-13 DIAGNOSIS — E1149 Type 2 diabetes mellitus with other diabetic neurological complication: Secondary | ICD-10-CM | POA: Diagnosis not present

## 2012-06-13 DIAGNOSIS — K7689 Other specified diseases of liver: Secondary | ICD-10-CM | POA: Diagnosis not present

## 2012-06-14 ENCOUNTER — Other Ambulatory Visit: Payer: Self-pay

## 2012-06-14 MED ORDER — PROPRANOLOL HCL 20 MG PO TABS: 20.0000 mg | ORAL_TABLET | Freq: Two times a day (BID) | ORAL | Status: DC

## 2012-07-04 ENCOUNTER — Ambulatory Visit: Payer: Medicare Other | Admitting: Orthopedic Surgery

## 2012-07-12 ENCOUNTER — Ambulatory Visit (INDEPENDENT_AMBULATORY_CARE_PROVIDER_SITE_OTHER): Payer: Medicare Other | Admitting: Orthopedic Surgery

## 2012-07-12 ENCOUNTER — Ambulatory Visit (INDEPENDENT_AMBULATORY_CARE_PROVIDER_SITE_OTHER): Payer: Medicare Other

## 2012-07-12 ENCOUNTER — Encounter: Payer: Self-pay | Admitting: Orthopedic Surgery

## 2012-07-12 VITALS — Ht 62.0 in | Wt 155.0 lb

## 2012-07-12 DIAGNOSIS — L988 Other specified disorders of the skin and subcutaneous tissue: Secondary | ICD-10-CM

## 2012-07-12 DIAGNOSIS — IMO0002 Reserved for concepts with insufficient information to code with codable children: Secondary | ICD-10-CM

## 2012-07-12 NOTE — Progress Notes (Signed)
Patient ID: ALEKSI BRUMMET, male   DOB: [birth year], 66 y.o.   MRN: [id number] Chief Complaint  Patient presents with  . Hand Pain    Cyst om right thumb for 7 months.    Referral by Dr. Jena Gauss  The patient has noticed a mass on his RIGHT thumb, increasing in size over the last 7 months. No functional deficits and no pain however, it has changed in size  Review of systems is quite extensive. He complains of joint pain joint swelling muscle pain, redness, heat, difficulty urinating, painful urination, changes in his skin numbness, nervousness anxiety, depression, amongst others. His history of cirrhosis. He has an enlarged spleen. He has some difficulty with his lower back that is requiring pain management.  Examination of the RIGHT thumb reveals a 5 x 5 millimeter mass arm appears lobulated appears to be in the soft tissue beneath the skin. Flexion-extension is normal. Neurovascular exam is normal. No change in color.  There is a firmness to this area.  No lymphadenopathy in the epitrochlear region.  X-ray shows no bony involvement. The soft tissue mass.  Impression soft tissue mass, RIGHT thumb.  Recommend consult with hand surgeon for possible biopsy

## 2012-07-12 NOTE — Patient Instructions (Addendum)
Referral to Dr Orlan Leavens: [facility] specialist] Mass right thumb

## 2012-07-13 ENCOUNTER — Telehealth: Payer: Self-pay | Admitting: *Deleted

## 2012-07-13 NOTE — Telephone Encounter (Signed)
Faxed referral and office notes to [facility] 07/12/12. Awaiting appointment.

## 2012-07-17 ENCOUNTER — Other Ambulatory Visit: Payer: Self-pay | Admitting: *Deleted

## 2012-07-17 DIAGNOSIS — R2230 Localized swelling, mass and lump, unspecified upper limb: Secondary | ICD-10-CM

## 2012-07-17 NOTE — Telephone Encounter (Signed)
Patient has appointment with Dr. Melvyn Novas @ [facility] Monday 07/24/12 at 3:30pm

## 2012-08-01 DIAGNOSIS — I10 Essential (primary) hypertension: Secondary | ICD-10-CM | POA: Diagnosis not present

## 2012-08-01 DIAGNOSIS — K7290 Hepatic failure, unspecified without coma: Secondary | ICD-10-CM | POA: Diagnosis not present

## 2012-08-01 DIAGNOSIS — F411 Generalized anxiety disorder: Secondary | ICD-10-CM | POA: Diagnosis not present

## 2012-08-01 DIAGNOSIS — Z125 Encounter for screening for malignant neoplasm of prostate: Secondary | ICD-10-CM | POA: Diagnosis not present

## 2012-08-01 DIAGNOSIS — E1142 Type 2 diabetes mellitus with diabetic polyneuropathy: Secondary | ICD-10-CM | POA: Diagnosis not present

## 2012-08-01 DIAGNOSIS — E1149 Type 2 diabetes mellitus with other diabetic neurological complication: Secondary | ICD-10-CM | POA: Diagnosis not present

## 2012-08-01 DIAGNOSIS — E781 Pure hyperglyceridemia: Secondary | ICD-10-CM | POA: Diagnosis not present

## 2012-08-07 DIAGNOSIS — G894 Chronic pain syndrome: Secondary | ICD-10-CM | POA: Diagnosis not present

## 2012-08-07 DIAGNOSIS — R1011 Right upper quadrant pain: Secondary | ICD-10-CM | POA: Diagnosis not present

## 2012-08-07 DIAGNOSIS — Z79899 Other long term (current) drug therapy: Secondary | ICD-10-CM | POA: Diagnosis not present

## 2012-08-07 DIAGNOSIS — Z5181 Encounter for therapeutic drug level monitoring: Secondary | ICD-10-CM | POA: Diagnosis not present

## 2012-08-09 ENCOUNTER — Other Ambulatory Visit (HOSPITAL_COMMUNITY): Payer: Medicare Other

## 2012-08-11 ENCOUNTER — Ambulatory Visit (HOSPITAL_COMMUNITY): Payer: Medicare Other | Admitting: Oncology

## 2012-08-16 ENCOUNTER — Encounter (HOSPITAL_COMMUNITY): Payer: Medicare Other | Attending: Oncology

## 2012-08-16 DIAGNOSIS — E119 Type 2 diabetes mellitus without complications: Secondary | ICD-10-CM | POA: Diagnosis not present

## 2012-08-16 DIAGNOSIS — D696 Thrombocytopenia, unspecified: Secondary | ICD-10-CM | POA: Diagnosis not present

## 2012-08-16 DIAGNOSIS — K7460 Unspecified cirrhosis of liver: Secondary | ICD-10-CM | POA: Diagnosis not present

## 2012-08-16 DIAGNOSIS — D509 Iron deficiency anemia, unspecified: Secondary | ICD-10-CM | POA: Insufficient documentation

## 2012-08-16 DIAGNOSIS — E7800 Pure hypercholesterolemia, unspecified: Secondary | ICD-10-CM | POA: Insufficient documentation

## 2012-08-16 LAB — CBC WITH DIFFERENTIAL/PLATELET
Basophils Relative: 1 % (ref 0–1)
HCT: 38.4 % — ABNORMAL LOW (ref 39.0–52.0)
Hemoglobin: 12.3 g/dL — ABNORMAL LOW (ref 13.0–17.0)
Lymphocytes Relative: 26 % (ref 12–46)
Lymphs Abs: 1.1 10*3/uL (ref 0.7–4.0)
MCHC: 32 g/dL (ref 30.0–36.0)
Monocytes Relative: 5 % (ref 3–12)
Neutro Abs: 2.7 10*3/uL (ref 1.7–7.7)
Neutrophils Relative %: 66 % (ref 43–77)
RBC: 3.95 MIL/uL — ABNORMAL LOW (ref 4.22–5.81)
WBC: 4.1 10*3/uL (ref 4.0–10.5)

## 2012-08-16 LAB — IRON AND TIBC
Saturation Ratios: 12 % — ABNORMAL LOW (ref 20–55)
TIBC: 414 ug/dL (ref 215–435)
UIBC: 363 ug/dL (ref 125–400)

## 2012-08-16 NOTE — Progress Notes (Signed)
Labs drawn today for cbc/diff,Iron and IBC,ferr 

## 2012-08-18 DIAGNOSIS — M79609 Pain in unspecified limb: Secondary | ICD-10-CM | POA: Diagnosis not present

## 2012-08-18 DIAGNOSIS — R29898 Other symptoms and signs involving the musculoskeletal system: Secondary | ICD-10-CM | POA: Diagnosis not present

## 2012-08-21 ENCOUNTER — Encounter (HOSPITAL_BASED_OUTPATIENT_CLINIC_OR_DEPARTMENT_OTHER): Payer: Medicare Other | Admitting: Oncology

## 2012-08-21 VITALS — BP 94/57 | HR 79 | Temp 98.9°F | Resp 18 | Wt 155.0 lb

## 2012-08-21 DIAGNOSIS — D696 Thrombocytopenia, unspecified: Secondary | ICD-10-CM

## 2012-08-21 DIAGNOSIS — K7460 Unspecified cirrhosis of liver: Secondary | ICD-10-CM

## 2012-08-21 DIAGNOSIS — D500 Iron deficiency anemia secondary to blood loss (chronic): Secondary | ICD-10-CM | POA: Diagnosis not present

## 2012-08-21 NOTE — Patient Instructions (Signed)
[facility] Discharge Instructions  RECOMMENDATIONS MADE BY THE CONSULTANT AND ANY TEST RESULTS WILL BE SENT TO YOUR REFERRING PHYSICIAN.  EXAM FINDINGS BY THE PHYSICIAN TODAY AND SIGNS OR SYMPTOMS TO REPORT TO CLINIC OR PRIMARY PHYSICIAN: Exam findings as discussed by Dr. Mariel Sleet.  SPECIAL INSTRUCTIONS/FOLLOW-UP: 1.  Return in March for your Feraheme infusion. 2.  You are scheduled for labs and then to see T. Kefalas, PA-C in 3 months.  Please let us know of any concerns before your next scheduled appointment.  Thank you for choosing [facility] to provide your oncology and hematology care.  To afford each patient quality time with our providers, please arrive at least 15 minutes before your scheduled appointment time.  With your help, our goal is to use those 15 minutes to complete the necessary work-up to ensure our physicians have the information they need to help with your evaluation and healthcare recommendations.    Effective January 1st, 2014, we ask that you re-schedule your appointment with our physicians should you arrive 10 or more minutes late for your appointment.  We strive to give you quality time with our providers, and arriving late affects you and other patients whose appointments are after yours.    Again, thank you for choosing [facility].  Our hope is that these requests will decrease the amount of time that you wait before being seen by our physicians.       _____________________________________________________________  Should you have questions after your visit to [facility], please contact our office at [phone] between the hours of 8:30 a.m. and 5:00 p.m.  Voicemails left after 4:30 p.m. will not be returned until the following business day.  For prescription refill requests, have your pharmacy contact our office with your prescription refill request.

## 2012-08-21 NOTE — Progress Notes (Signed)
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 We will therefore give him IV feraheme on 09/06/2012 and check his iron levels in may. This may help his platelets as well as his sense of well-being. He must have adequate iron stores to keep up his hemoglobin. #4 hypercholesterolemia on therapy #5 chronic pain on methadone and hydrocodone pills 4 times a day each with nice control. 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 White count is fine. Iron studies indicate iron deficiency. His ferritin is within the normal range but at the low end. With his liver disease it is not completely accurate necessarily. His serum iron, TIBC indicate iron deficiency in spite of the oral iron. We therefore will replace his iron with feraheme on 09/06/2012.  Within check his lab work in may.  He has developed a cyst, in fact 2 cysts on his right thumb which are going to be operated on this Thursday. There is no history of trauma.  I think it's wise to wait on the IV iron until after the surgery. He can continue to take his oral iron since it is not constipating him in the setting of this lactulose usage. It may be helping to maintain him but it certainly is not enough.  We'll see him back in may. It is of note that Dr Kendell Bane. did a CAT scan which did not reveal distinct liver lesions but there was  a question of 2 very small ones. This needs to be reevaluated in the future per Dr. Kendell Bane along with a small lung lesion which will be looked at again in July. He did smoke approximately 15 years, half a pack a day. I agree with that evaluation.

## 2012-09-04 ENCOUNTER — Other Ambulatory Visit: Payer: Self-pay | Admitting: Neurology

## 2012-09-04 DIAGNOSIS — M5416 Radiculopathy, lumbar region: Secondary | ICD-10-CM

## 2012-09-04 DIAGNOSIS — G609 Hereditary and idiopathic neuropathy, unspecified: Secondary | ICD-10-CM | POA: Diagnosis not present

## 2012-09-04 DIAGNOSIS — IMO0002 Reserved for concepts with insufficient information to code with codable children: Secondary | ICD-10-CM | POA: Diagnosis not present

## 2012-09-04 DIAGNOSIS — R51 Headache: Secondary | ICD-10-CM | POA: Diagnosis not present

## 2012-09-06 ENCOUNTER — Encounter (HOSPITAL_COMMUNITY): Payer: Medicare Other | Attending: Oncology

## 2012-09-06 VITALS — BP 119/76 | HR 85 | Temp 97.8°F | Resp 16

## 2012-09-06 DIAGNOSIS — D500 Iron deficiency anemia secondary to blood loss (chronic): Secondary | ICD-10-CM | POA: Diagnosis not present

## 2012-09-06 DIAGNOSIS — K7460 Unspecified cirrhosis of liver: Secondary | ICD-10-CM

## 2012-09-06 DIAGNOSIS — D696 Thrombocytopenia, unspecified: Secondary | ICD-10-CM

## 2012-09-06 MED ORDER — SODIUM CHLORIDE 0.9 % IJ SOLN
10.0000 mL | INTRAMUSCULAR | Status: DC | PRN
  Administered 2012-09-06: 10 mL
  Filled 2012-09-06: qty 10

## 2012-09-06 MED ORDER — SODIUM CHLORIDE 0.9 % IV SOLN
1020.0000 mg | Freq: Once | INTRAVENOUS | Status: AC
  Administered 2012-09-06: 1020 mg via INTRAVENOUS
  Filled 2012-09-06: qty 34

## 2012-09-06 NOTE — Progress Notes (Signed)
Tolerated fereheme 1020 mg IV well.  D/C home with family/friends.

## 2012-09-11 ENCOUNTER — Ambulatory Visit (HOSPITAL_COMMUNITY): Payer: Medicare Other

## 2012-09-14 ENCOUNTER — Ambulatory Visit (HOSPITAL_COMMUNITY)
Admission: RE | Admit: 2012-09-14 | Discharge: 2012-09-14 | Disposition: A | Discharge status: Home / Self Care | Payer: Medicare Other | Source: Ambulatory Visit | Attending: Neurology | Admitting: Neurology

## 2012-09-14 DIAGNOSIS — M5416 Radiculopathy, lumbar region: Secondary | ICD-10-CM

## 2012-09-14 DIAGNOSIS — M5137 Other intervertebral disc degeneration, lumbosacral region: Secondary | ICD-10-CM | POA: Insufficient documentation

## 2012-09-14 DIAGNOSIS — M545 Low back pain, unspecified: Secondary | ICD-10-CM | POA: Insufficient documentation

## 2012-09-14 DIAGNOSIS — M51379 Other intervertebral disc degeneration, lumbosacral region without mention of lumbar back pain or lower extremity pain: Secondary | ICD-10-CM | POA: Insufficient documentation

## 2012-09-14 DIAGNOSIS — M47817 Spondylosis without myelopathy or radiculopathy, lumbosacral region: Secondary | ICD-10-CM | POA: Diagnosis not present

## 2012-09-14 DIAGNOSIS — R209 Unspecified disturbances of skin sensation: Secondary | ICD-10-CM | POA: Diagnosis not present

## 2012-09-26 ENCOUNTER — Other Ambulatory Visit: Payer: Self-pay

## 2012-09-27 MED ORDER — LACTULOSE 10 GM/15ML PO SOLN: 20.0000 g | Freq: Two times a day (BID) | ORAL | Status: DC

## 2012-09-29 DIAGNOSIS — R1011 Right upper quadrant pain: Secondary | ICD-10-CM | POA: Diagnosis not present

## 2012-09-29 DIAGNOSIS — Z79899 Other long term (current) drug therapy: Secondary | ICD-10-CM | POA: Diagnosis not present

## 2012-09-29 DIAGNOSIS — M4310 Spondylolisthesis, site unspecified: Secondary | ICD-10-CM | POA: Diagnosis not present

## 2012-09-29 DIAGNOSIS — Z5181 Encounter for therapeutic drug level monitoring: Secondary | ICD-10-CM | POA: Diagnosis not present

## 2012-09-29 DIAGNOSIS — G894 Chronic pain syndrome: Secondary | ICD-10-CM | POA: Diagnosis not present

## 2012-10-03 DIAGNOSIS — E1149 Type 2 diabetes mellitus with other diabetic neurological complication: Secondary | ICD-10-CM | POA: Diagnosis not present

## 2012-10-03 DIAGNOSIS — IMO0002 Reserved for concepts with insufficient information to code with codable children: Secondary | ICD-10-CM | POA: Diagnosis not present

## 2012-10-03 DIAGNOSIS — E1142 Type 2 diabetes mellitus with diabetic polyneuropathy: Secondary | ICD-10-CM | POA: Diagnosis not present

## 2012-10-18 ENCOUNTER — Other Ambulatory Visit (HOSPITAL_COMMUNITY): Payer: Medicare Other

## 2012-10-19 ENCOUNTER — Ambulatory Visit (HOSPITAL_COMMUNITY): Payer: Medicare Other | Admitting: Oncology

## 2012-10-19 DIAGNOSIS — G894 Chronic pain syndrome: Secondary | ICD-10-CM | POA: Diagnosis not present

## 2012-10-19 DIAGNOSIS — M51379 Other intervertebral disc degeneration, lumbosacral region without mention of lumbar back pain or lower extremity pain: Secondary | ICD-10-CM | POA: Diagnosis not present

## 2012-10-19 DIAGNOSIS — M545 Low back pain: Secondary | ICD-10-CM | POA: Diagnosis not present

## 2012-10-19 DIAGNOSIS — M47817 Spondylosis without myelopathy or radiculopathy, lumbosacral region: Secondary | ICD-10-CM | POA: Diagnosis not present

## 2012-10-19 DIAGNOSIS — M5137 Other intervertebral disc degeneration, lumbosacral region: Secondary | ICD-10-CM | POA: Diagnosis not present

## 2012-11-08 ENCOUNTER — Encounter (HOSPITAL_COMMUNITY): Payer: Medicare Other | Attending: Oncology

## 2012-11-08 DIAGNOSIS — D500 Iron deficiency anemia secondary to blood loss (chronic): Secondary | ICD-10-CM | POA: Diagnosis not present

## 2012-11-08 DIAGNOSIS — K7460 Unspecified cirrhosis of liver: Secondary | ICD-10-CM | POA: Insufficient documentation

## 2012-11-08 DIAGNOSIS — D696 Thrombocytopenia, unspecified: Secondary | ICD-10-CM | POA: Insufficient documentation

## 2012-11-08 DIAGNOSIS — D509 Iron deficiency anemia, unspecified: Secondary | ICD-10-CM | POA: Diagnosis not present

## 2012-11-08 LAB — CBC WITH DIFFERENTIAL/PLATELET
Basophils Absolute: 0 10*3/uL (ref 0.0–0.1)
Eosinophils Absolute: 0.1 10*3/uL (ref 0.0–0.7)
Eosinophils Relative: 1 % (ref 0–5)
Lymphocytes Relative: 23 % (ref 12–46)
MCV: 98.7 fL (ref 78.0–100.0)
Neutrophils Relative %: 67 % (ref 43–77)
Platelets: 83 10*3/uL — ABNORMAL LOW (ref 150–400)
RDW: 14.9 % (ref 11.5–15.5)
WBC: 4.3 10*3/uL (ref 4.0–10.5)

## 2012-11-08 LAB — FERRITIN: Ferritin: 120 ng/mL (ref 22–322)

## 2012-11-08 LAB — IRON AND TIBC
Iron: 101 ug/dL (ref 42–135)
Saturation Ratios: 25 % (ref 20–55)

## 2012-11-08 NOTE — Progress Notes (Signed)
Labs drawn today for cbc/diff,ferr,Iron and IBC 

## 2012-11-09 ENCOUNTER — Ambulatory Visit (HOSPITAL_COMMUNITY)
Admission: RE | Admit: 2012-11-09 | Discharge: 2012-11-09 | Disposition: A | Discharge status: Home / Self Care | Payer: Medicare Other | Source: Ambulatory Visit | Attending: Gastroenterology | Admitting: Gastroenterology

## 2012-11-09 ENCOUNTER — Encounter (HOSPITAL_COMMUNITY): Payer: Self-pay | Admitting: Oncology

## 2012-11-09 ENCOUNTER — Other Ambulatory Visit: Payer: Self-pay | Admitting: Gastroenterology

## 2012-11-09 ENCOUNTER — Encounter (HOSPITAL_BASED_OUTPATIENT_CLINIC_OR_DEPARTMENT_OTHER): Payer: Medicare Other | Admitting: Oncology

## 2012-11-09 VITALS — BP 106/70 | HR 80 | Temp 98.1°F | Resp 18 | Wt 154.6 lb

## 2012-11-09 DIAGNOSIS — K7460 Unspecified cirrhosis of liver: Secondary | ICD-10-CM

## 2012-11-09 DIAGNOSIS — R1900 Intra-abdominal and pelvic swelling, mass and lump, unspecified site: Secondary | ICD-10-CM | POA: Diagnosis not present

## 2012-11-09 DIAGNOSIS — D509 Iron deficiency anemia, unspecified: Secondary | ICD-10-CM | POA: Diagnosis not present

## 2012-11-09 DIAGNOSIS — R141 Gas pain: Secondary | ICD-10-CM

## 2012-11-09 DIAGNOSIS — D696 Thrombocytopenia, unspecified: Secondary | ICD-10-CM

## 2012-11-09 DIAGNOSIS — R109 Unspecified abdominal pain: Secondary | ICD-10-CM | POA: Insufficient documentation

## 2012-11-09 DIAGNOSIS — R143 Flatulence: Secondary | ICD-10-CM | POA: Diagnosis not present

## 2012-11-09 HISTORY — DX: Iron deficiency anemia, unspecified: D50.9

## 2012-11-09 NOTE — Patient Instructions (Addendum)
.  [facility] Discharge Instructions  RECOMMENDATIONS MADE BY THE CONSULTANT AND ANY TEST RESULTS WILL BE SENT TO YOUR REFERRING PHYSICIAN.  EXAM FINDINGS BY THE PHYSICIAN TODAY AND SIGNS OR SYMPTOMS TO REPORT TO CLINIC OR PRIMARY PHYSICIAN:  Exam per Elijah Birk Talked to [facility] for help with the swelling in your abd.  We will send you down for ultra sound and poss paracentesis to remove fluid  INSTRUCTIONS GIVEN AND DISCUSSED: Iron infusion the end of this month  SPECIAL INSTRUCTIONS/FOLLOW-UP: Labs in 3 months then to see Dr. Mariel Sleet  Thank you for choosing [facility] to provide your oncology and hematology care.  To afford each patient quality time with our providers, please arrive at least 15 minutes before your scheduled appointment time.  With your help, our goal is to use those 15 minutes to complete the necessary work-up to ensure our physicians have the information they need to help with your evaluation and healthcare recommendations.    Effective January 1st, 2014, we ask that you re-schedule your appointment with our physicians should you arrive 10 or more minutes late for your appointment.  We strive to give you quality time with our providers, and arriving late affects you and other patients whose appointments are after yours.    Again, thank you for choosing [facility].  Our hope is that these requests will decrease the amount of time that you wait before being seen by our physicians.       _____________________________________________________________  Should you have questions after your visit to [facility], please contact our office at [phone] between the hours of 8:30 a.m. and 5:00 p.m.  Voicemails left after 4:30 p.m. will not be returned until the following business day.  For prescription refill requests, have your pharmacy contact our office with your prescription refill request.
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
## 2012-11-13 NOTE — Progress Notes (Signed)
Quick Note:  Negative [place] for ascites Please have patient follow-up in our office for further evaluation. Seen recently by Hem/Onc with concerns for ascites. Notable increase in abdominal girth. Unclear etiology at this time. Non-urgent OV. ______

## 2012-11-15 ENCOUNTER — Other Ambulatory Visit: Payer: Self-pay

## 2012-11-15 DIAGNOSIS — K7460 Unspecified cirrhosis of liver: Secondary | ICD-10-CM | POA: Diagnosis not present

## 2012-11-16 ENCOUNTER — Encounter: Payer: Self-pay | Admitting: Internal Medicine

## 2012-11-21 ENCOUNTER — Telehealth: Payer: Self-pay | Admitting: Internal Medicine

## 2012-11-21 ENCOUNTER — Other Ambulatory Visit: Payer: Self-pay | Admitting: Internal Medicine

## 2012-11-21 DIAGNOSIS — K769 Liver disease, unspecified: Secondary | ICD-10-CM

## 2012-11-21 NOTE — Telephone Encounter (Signed)
Pt has OV with RMR on 7/11 and June recall has he needs MRI in 6 months. I told wife I would have LAW call her back to arrange for that. She said she would be there until 1230 today. [phone]

## 2012-11-21 NOTE — Telephone Encounter (Signed)
Patient may have Ativan 2 mg orally 30 minutes before MRI

## 2012-11-21 NOTE — Telephone Encounter (Signed)
Brad Wall is asking for something for claustrophobia when he has his MRI done, please advise

## 2012-11-21 NOTE — Telephone Encounter (Signed)
Patient is scheduled for MRI on 5/30 at 8:00 am and his wife is aware

## 2012-11-21 NOTE — Telephone Encounter (Signed)
Routing to RMR for review. 

## 2012-11-21 NOTE — Telephone Encounter (Signed)
pts wife is aware. She stated he already has ativan and will give it to him prior to MRI

## 2012-11-22 DIAGNOSIS — K7460 Unspecified cirrhosis of liver: Secondary | ICD-10-CM | POA: Diagnosis not present

## 2012-11-23 ENCOUNTER — Encounter (HOSPITAL_BASED_OUTPATIENT_CLINIC_OR_DEPARTMENT_OTHER): Payer: Medicare Other

## 2012-11-23 VITALS — BP 119/69 | HR 85 | Temp 98.1°F | Resp 16

## 2012-11-23 DIAGNOSIS — D509 Iron deficiency anemia, unspecified: Secondary | ICD-10-CM

## 2012-11-23 MED ORDER — SODIUM CHLORIDE 0.9 % IV SOLN
INTRAVENOUS | Status: DC
  Administered 2012-11-23: 13:00:00 via INTRAVENOUS

## 2012-11-23 MED ORDER — SODIUM CHLORIDE 0.9 % IV SOLN
1020.0000 mg | Freq: Once | INTRAVENOUS | Status: AC
  Administered 2012-11-23: 1020 mg via INTRAVENOUS
  Filled 2012-11-23: qty 34

## 2012-11-23 MED ORDER — SODIUM CHLORIDE 0.9 % IJ SOLN
10.0000 mL | INTRAMUSCULAR | Status: DC | PRN
  Administered 2012-11-23: 10 mL via INTRAVENOUS
  Filled 2012-11-23: qty 10

## 2012-11-24 ENCOUNTER — Ambulatory Visit (HOSPITAL_COMMUNITY)
Admission: RE | Admit: 2012-11-24 | Discharge: 2012-11-24 | Disposition: A | Discharge status: Home / Self Care | Payer: Medicare Other | Source: Ambulatory Visit | Attending: Internal Medicine | Admitting: Internal Medicine

## 2012-11-24 DIAGNOSIS — K769 Liver disease, unspecified: Secondary | ICD-10-CM

## 2012-11-24 DIAGNOSIS — R161 Splenomegaly, not elsewhere classified: Secondary | ICD-10-CM | POA: Diagnosis not present

## 2012-11-24 DIAGNOSIS — K7460 Unspecified cirrhosis of liver: Secondary | ICD-10-CM | POA: Diagnosis not present

## 2012-11-24 DIAGNOSIS — K7689 Other specified diseases of liver: Secondary | ICD-10-CM | POA: Insufficient documentation

## 2012-11-24 MED ORDER — GADOBENATE DIMEGLUMINE 529 MG/ML IV SOLN
15.0000 mL | Freq: Once | INTRAVENOUS | Status: AC | PRN
  Administered 2012-11-24: 15 mL via INTRAVENOUS

## 2012-11-24 NOTE — Progress Notes (Signed)
Blood sample obtained from right arm IV for Creatnine level.  

## 2012-11-27 ENCOUNTER — Telehealth: Payer: Self-pay | Admitting: Gastroenterology

## 2012-11-27 NOTE — Telephone Encounter (Signed)
Patient's wife called today, close to tears, worried about the MRI recently performed. She states this was originally done on Friday, but they were called back as they were told "something had drastically changed". Upon review, it appears technical issues were the original concern, and then upon repeat MRI, the patient was quite sedated and unable to fully cooperate.   No evidence of HCC. Reviewed with wife. Early f/u recommended. Anticipate 3 month interval for next MRI instead of waiting the full 6 months.   Will route to Dr. Jena Gauss for final recommendations regarding timing of next MRI. Patient and wife are aware of results.

## 2012-11-28 DIAGNOSIS — R209 Unspecified disturbances of skin sensation: Secondary | ICD-10-CM | POA: Diagnosis not present

## 2012-11-28 DIAGNOSIS — E781 Pure hyperglyceridemia: Secondary | ICD-10-CM | POA: Diagnosis not present

## 2012-11-28 DIAGNOSIS — E1149 Type 2 diabetes mellitus with other diabetic neurological complication: Secondary | ICD-10-CM | POA: Diagnosis not present

## 2012-11-28 DIAGNOSIS — F411 Generalized anxiety disorder: Secondary | ICD-10-CM | POA: Diagnosis not present

## 2012-11-28 DIAGNOSIS — I10 Essential (primary) hypertension: Secondary | ICD-10-CM | POA: Diagnosis not present

## 2012-11-30 DIAGNOSIS — R141 Gas pain: Secondary | ICD-10-CM | POA: Diagnosis not present

## 2012-11-30 DIAGNOSIS — R143 Flatulence: Secondary | ICD-10-CM | POA: Diagnosis not present

## 2012-11-30 DIAGNOSIS — K219 Gastro-esophageal reflux disease without esophagitis: Secondary | ICD-10-CM | POA: Diagnosis not present

## 2012-11-30 DIAGNOSIS — R109 Unspecified abdominal pain: Secondary | ICD-10-CM | POA: Diagnosis not present

## 2012-11-30 DIAGNOSIS — I8390 Asymptomatic varicose veins of unspecified lower extremity: Secondary | ICD-10-CM | POA: Diagnosis not present

## 2012-12-04 ENCOUNTER — Other Ambulatory Visit: Payer: Self-pay

## 2012-12-04 MED ORDER — LACTULOSE 10 GM/15ML PO SOLN: 20.0000 g | Freq: Two times a day (BID) | ORAL | Status: DC

## 2012-12-18 ENCOUNTER — Other Ambulatory Visit: Payer: Self-pay | Admitting: Gastroenterology

## 2012-12-18 ENCOUNTER — Other Ambulatory Visit: Payer: Self-pay

## 2012-12-18 MED ORDER — PROPRANOLOL HCL 20 MG PO TABS: 20.0000 mg | ORAL_TABLET | Freq: Two times a day (BID) | ORAL | Status: DC

## 2012-12-18 NOTE — Telephone Encounter (Signed)
Refill done.  

## 2012-12-28 DIAGNOSIS — K219 Gastro-esophageal reflux disease without esophagitis: Secondary | ICD-10-CM | POA: Diagnosis not present

## 2012-12-28 DIAGNOSIS — R109 Unspecified abdominal pain: Secondary | ICD-10-CM | POA: Diagnosis not present

## 2013-01-02 DIAGNOSIS — R141 Gas pain: Secondary | ICD-10-CM | POA: Diagnosis not present

## 2013-01-02 DIAGNOSIS — A049 Bacterial intestinal infection, unspecified: Secondary | ICD-10-CM | POA: Diagnosis not present

## 2013-01-05 ENCOUNTER — Encounter: Payer: Self-pay | Admitting: Internal Medicine

## 2013-01-05 ENCOUNTER — Ambulatory Visit (INDEPENDENT_AMBULATORY_CARE_PROVIDER_SITE_OTHER): Payer: Medicare Other | Admitting: Internal Medicine

## 2013-01-05 VITALS — BP 96/67 | HR 83 | Temp 98.2°F | Ht 62.0 in | Wt 154.6 lb

## 2013-01-05 DIAGNOSIS — G9340 Encephalopathy, unspecified: Secondary | ICD-10-CM | POA: Diagnosis not present

## 2013-01-05 DIAGNOSIS — R918 Other nonspecific abnormal finding of lung field: Secondary | ICD-10-CM | POA: Diagnosis not present

## 2013-01-05 DIAGNOSIS — K7460 Unspecified cirrhosis of liver: Secondary | ICD-10-CM | POA: Diagnosis not present

## 2013-01-05 NOTE — Patient Instructions (Addendum)
Follow-up chest CT now  OPEN MRI without sedation in October 2014  Minimize use of Phenergan  Office visit November 2014  Continue lactulose, Prilosec and Inderal  Cc PCP
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
## 2013-01-07 ENCOUNTER — Encounter: Payer: Self-pay | Admitting: Internal Medicine

## 2013-01-08 DIAGNOSIS — Z5181 Encounter for therapeutic drug level monitoring: Secondary | ICD-10-CM | POA: Diagnosis not present

## 2013-01-08 DIAGNOSIS — G894 Chronic pain syndrome: Secondary | ICD-10-CM | POA: Diagnosis not present

## 2013-01-08 DIAGNOSIS — Z79899 Other long term (current) drug therapy: Secondary | ICD-10-CM | POA: Diagnosis not present

## 2013-01-08 DIAGNOSIS — M533 Sacrococcygeal disorders, not elsewhere classified: Secondary | ICD-10-CM | POA: Diagnosis not present

## 2013-01-09 ENCOUNTER — Ambulatory Visit (HOSPITAL_COMMUNITY)
Admission: RE | Admit: 2013-01-09 | Discharge: 2013-01-09 | Disposition: A | Discharge status: Home / Self Care | Payer: Medicare Other | Source: Ambulatory Visit | Attending: Internal Medicine | Admitting: Internal Medicine

## 2013-01-09 ENCOUNTER — Encounter (HOSPITAL_COMMUNITY): Payer: Self-pay

## 2013-01-09 DIAGNOSIS — Z09 Encounter for follow-up examination after completed treatment for conditions other than malignant neoplasm: Secondary | ICD-10-CM | POA: Insufficient documentation

## 2013-01-09 DIAGNOSIS — K7460 Unspecified cirrhosis of liver: Secondary | ICD-10-CM | POA: Insufficient documentation

## 2013-01-09 DIAGNOSIS — R918 Other nonspecific abnormal finding of lung field: Secondary | ICD-10-CM | POA: Diagnosis not present

## 2013-01-09 DIAGNOSIS — R911 Solitary pulmonary nodule: Secondary | ICD-10-CM | POA: Diagnosis not present

## 2013-01-09 MED ORDER — IOHEXOL 300 MG/ML  SOLN
80.0000 mL | Freq: Once | INTRAMUSCULAR | Status: AC | PRN
  Administered 2013-01-09: 80 mL via INTRAVENOUS

## 2013-01-25 ENCOUNTER — Telehealth: Payer: Self-pay | Admitting: General Practice

## 2013-01-25 DIAGNOSIS — K7460 Unspecified cirrhosis of liver: Secondary | ICD-10-CM | POA: Diagnosis not present

## 2013-01-25 DIAGNOSIS — R209 Unspecified disturbances of skin sensation: Secondary | ICD-10-CM | POA: Diagnosis not present

## 2013-01-25 DIAGNOSIS — G9349 Other encephalopathy: Secondary | ICD-10-CM | POA: Diagnosis not present

## 2013-01-25 DIAGNOSIS — H9190 Unspecified hearing loss, unspecified ear: Secondary | ICD-10-CM | POA: Diagnosis not present

## 2013-01-25 DIAGNOSIS — G8190 Hemiplegia, unspecified affecting unspecified side: Secondary | ICD-10-CM | POA: Diagnosis not present

## 2013-01-25 NOTE — Telephone Encounter (Signed)
Pts wife is aware 

## 2013-01-25 NOTE — Telephone Encounter (Signed)
Spoke with pts wife- talked with her about what a normal ammonia level is and how he is taking the lactulose to have 2-3 good bms daily to keep levels down. She said he has 3 solid bms everyday before noon. He is taking of lactulose bid. When he was taking he had really bad diarrhea. She is very concerned about him. He is moving a little slow this week and sleeping more than usual. None of his meds have changed. He did take a fiurnial for a migraine last week. Wife wants to know what she should do.

## 2013-01-25 NOTE — Telephone Encounter (Signed)
Ammonia levels can fluctuate, and it is important that he continue the lactulose 20 ml BID. I would recommend a trial of increasing to 20 ml TID. He can titrate this as needed to avoid loose stools.  I think it would be a good idea to try and get Xifaxan approved for him. Appears in the past cost was an issue. Needs to avoid any NSAIDs, narcotics, and Phenergan. He needs to limit the migraine medication.  The most important thing is how the patient is doing, not the blood test. If she notes any worsening of his cognitive status, worsening confusion, etc, then seek medical attention. However, he seems to be at baseline, and they need to avoid those medications listed and titrate the lactulose as recommended.

## 2013-01-25 NOTE — Telephone Encounter (Signed)
Patient's wife called and stated that her husband had a lot of blood work done today that was ordered by a neurologist today at [facility].  She just received a call from that practice and stated that his Ammonia Level is 103 and she needed to call Dr. Jena Gauss ASAP.  He is complaining of nausea and very tired.  She just gave him a dose of his lactulose  Routing to [employer]

## 2013-01-27 ENCOUNTER — Encounter (HOSPITAL_COMMUNITY): Payer: Self-pay | Admitting: Emergency Medicine

## 2013-01-27 ENCOUNTER — Emergency Department (HOSPITAL_COMMUNITY)
Admission: EM | Admit: 2013-01-27 | Discharge: 2013-01-27 | Disposition: A | Discharge status: Home / Self Care | Payer: Medicare Other | Attending: Emergency Medicine | Admitting: Emergency Medicine

## 2013-01-27 ENCOUNTER — Emergency Department (HOSPITAL_COMMUNITY): Payer: Medicare Other

## 2013-01-27 DIAGNOSIS — G43909 Migraine, unspecified, not intractable, without status migrainosus: Secondary | ICD-10-CM | POA: Insufficient documentation

## 2013-01-27 DIAGNOSIS — R110 Nausea: Secondary | ICD-10-CM | POA: Insufficient documentation

## 2013-01-27 DIAGNOSIS — M6281 Muscle weakness (generalized): Secondary | ICD-10-CM | POA: Diagnosis not present

## 2013-01-27 DIAGNOSIS — R142 Eructation: Secondary | ICD-10-CM | POA: Insufficient documentation

## 2013-01-27 DIAGNOSIS — K219 Gastro-esophageal reflux disease without esophagitis: Secondary | ICD-10-CM | POA: Insufficient documentation

## 2013-01-27 DIAGNOSIS — M545 Low back pain, unspecified: Secondary | ICD-10-CM | POA: Diagnosis not present

## 2013-01-27 DIAGNOSIS — R630 Anorexia: Secondary | ICD-10-CM | POA: Insufficient documentation

## 2013-01-27 DIAGNOSIS — Z7982 Long term (current) use of aspirin: Secondary | ICD-10-CM | POA: Insufficient documentation

## 2013-01-27 DIAGNOSIS — G4710 Hypersomnia, unspecified: Secondary | ICD-10-CM | POA: Diagnosis not present

## 2013-01-27 DIAGNOSIS — I69998 Other sequelae following unspecified cerebrovascular disease: Secondary | ICD-10-CM | POA: Diagnosis not present

## 2013-01-27 DIAGNOSIS — Z8619 Personal history of other infectious and parasitic diseases: Secondary | ICD-10-CM | POA: Insufficient documentation

## 2013-01-27 DIAGNOSIS — F3289 Other specified depressive episodes: Secondary | ICD-10-CM | POA: Insufficient documentation

## 2013-01-27 DIAGNOSIS — G8929 Other chronic pain: Secondary | ICD-10-CM | POA: Insufficient documentation

## 2013-01-27 DIAGNOSIS — E119 Type 2 diabetes mellitus without complications: Secondary | ICD-10-CM | POA: Insufficient documentation

## 2013-01-27 DIAGNOSIS — Z880 Allergy status to penicillin: Secondary | ICD-10-CM | POA: Insufficient documentation

## 2013-01-27 DIAGNOSIS — F329 Major depressive disorder, single episode, unspecified: Secondary | ICD-10-CM | POA: Insufficient documentation

## 2013-01-27 DIAGNOSIS — Z8669 Personal history of other diseases of the nervous system and sense organs: Secondary | ICD-10-CM | POA: Diagnosis not present

## 2013-01-27 DIAGNOSIS — D509 Iron deficiency anemia, unspecified: Secondary | ICD-10-CM | POA: Diagnosis not present

## 2013-01-27 DIAGNOSIS — R0989 Other specified symptoms and signs involving the circulatory and respiratory systems: Secondary | ICD-10-CM | POA: Diagnosis not present

## 2013-01-27 DIAGNOSIS — I10 Essential (primary) hypertension: Secondary | ICD-10-CM | POA: Diagnosis not present

## 2013-01-27 DIAGNOSIS — F411 Generalized anxiety disorder: Secondary | ICD-10-CM | POA: Insufficient documentation

## 2013-01-27 DIAGNOSIS — Z8719 Personal history of other diseases of the digestive system: Secondary | ICD-10-CM | POA: Insufficient documentation

## 2013-01-27 DIAGNOSIS — Z87891 Personal history of nicotine dependence: Secondary | ICD-10-CM | POA: Diagnosis not present

## 2013-01-27 DIAGNOSIS — Z8739 Personal history of other diseases of the musculoskeletal system and connective tissue: Secondary | ICD-10-CM | POA: Diagnosis not present

## 2013-01-27 DIAGNOSIS — Z794 Long term (current) use of insulin: Secondary | ICD-10-CM | POA: Insufficient documentation

## 2013-01-27 DIAGNOSIS — R141 Gas pain: Secondary | ICD-10-CM | POA: Insufficient documentation

## 2013-01-27 DIAGNOSIS — Z862 Personal history of diseases of the blood and blood-forming organs and certain disorders involving the immune mechanism: Secondary | ICD-10-CM | POA: Diagnosis not present

## 2013-01-27 DIAGNOSIS — Z9889 Other specified postprocedural states: Secondary | ICD-10-CM | POA: Insufficient documentation

## 2013-01-27 DIAGNOSIS — E785 Hyperlipidemia, unspecified: Secondary | ICD-10-CM | POA: Diagnosis not present

## 2013-01-27 DIAGNOSIS — R51 Headache: Secondary | ICD-10-CM | POA: Diagnosis not present

## 2013-01-27 DIAGNOSIS — Z8679 Personal history of other diseases of the circulatory system: Secondary | ICD-10-CM | POA: Insufficient documentation

## 2013-01-27 DIAGNOSIS — Z79899 Other long term (current) drug therapy: Secondary | ICD-10-CM | POA: Insufficient documentation

## 2013-01-27 DIAGNOSIS — R4182 Altered mental status, unspecified: Secondary | ICD-10-CM

## 2013-01-27 LAB — CBC WITH DIFFERENTIAL/PLATELET
Basophils Absolute: 0 10*3/uL (ref 0.0–0.1)
Eosinophils Absolute: 0.1 10*3/uL (ref 0.0–0.7)
Eosinophils Relative: 2 % (ref 0–5)
HCT: 38.8 % — ABNORMAL LOW (ref 39.0–52.0)
Lymphocytes Relative: 27 % (ref 12–46)
MCH: 33.2 pg (ref 26.0–34.0)
MCHC: 33.2 g/dL (ref 30.0–36.0)
MCV: 100 fL (ref 78.0–100.0)
Monocytes Absolute: 0.3 10*3/uL (ref 0.1–1.0)
Platelets: 99 10*3/uL — ABNORMAL LOW (ref 150–400)
RDW: 13.8 % (ref 11.5–15.5)

## 2013-01-27 LAB — PROTIME-INR
INR: 1.11 (ref 0.00–1.49)
Prothrombin Time: 14.1 seconds (ref 11.6–15.2)

## 2013-01-27 LAB — COMPREHENSIVE METABOLIC PANEL
AST: 47 U/L — ABNORMAL HIGH (ref 0–37)
Albumin: 4.1 g/dL (ref 3.5–5.2)
BUN: 13 mg/dL (ref 6–23)
Calcium: 10.1 mg/dL (ref 8.4–10.5)
Chloride: 100 mEq/L (ref 96–112)
Creatinine, Ser: 1.09 mg/dL (ref 0.50–1.35)
GFR calc non Af Amer: 70 mL/min — ABNORMAL LOW (ref >90)
Total Bilirubin: 0.6 mg/dL (ref 0.3–1.2)

## 2013-01-27 LAB — TROPONIN I: Troponin I: <0.3 ng/mL (ref <0.30)

## 2013-01-27 LAB — URINALYSIS, ROUTINE W REFLEX MICROSCOPIC
Bilirubin Urine: NEGATIVE
Glucose, UA: NEGATIVE mg/dL
Ketones, ur: NEGATIVE mg/dL
Protein, ur: NEGATIVE mg/dL
pH: 6 (ref 5.0–8.0)

## 2013-01-27 LAB — PRO B NATRIURETIC PEPTIDE: Pro B Natriuretic peptide (BNP): 33.9 pg/mL (ref 0–125)

## 2013-01-27 LAB — AMMONIA: Ammonia: 56 umol/L (ref 11–60)

## 2013-01-27 NOTE — ED Notes (Signed)
Pt wife reports altered mental status x 5 days. States pt was seen by neurologist on Thursday and had elevated ammonia level (h/s cirrhosis). Lactulose dose increased, but wife states pt is getting worse. Pt wife states Neurologist recommend pt have CT scan of head to r/o bleed. nad noted.
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
## 2013-01-29 ENCOUNTER — Telehealth: Payer: Self-pay | Admitting: *Deleted

## 2013-01-29 NOTE — Telephone Encounter (Signed)
Spoke with pts wife- she took him to the hospital on Sat b/c he was having increased confusion. His ammonia level was down to 56 at that time. He is taking the increased lactulose and so far he is not having diarrhea and having good results with it. He is still very sleepy.   Pt spoke with the neurologist today, they are wanting to do an MRI of his brain to check for lesions. pts wife had mentioned to them that [facility] wants to do an open MRI of the liver but she wasn't sure when. I checked and he is not due until October to have it done. pts wife is concerned about the insurance paying for 2 -3 MRI's within a few months of each other. The neurologist wanted to know if we could do the open MRI of the liver and add on the brain so he could have it all done at one time.  The neurologist is Elvina Sidle MD from [facility] in [place]. The phone number is [phone] if [facility] needs to speak with him.

## 2013-01-29 NOTE — Telephone Encounter (Signed)
Pt's wife called said that pt went to [facility] er Saturday, and wanted to talk to the nurse to let her know what the er said, please advise. [phone]

## 2013-01-30 NOTE — Telephone Encounter (Signed)
Tried to call pt- LMOM 

## 2013-01-30 NOTE — Telephone Encounter (Signed)
It doesn't matter to me if MRI of brain and liver done at the same time; however, MRI of the liver is not due until its due. I would not do it any earlier than what we have already recommended.

## 2013-01-31 ENCOUNTER — Other Ambulatory Visit: Payer: Self-pay | Admitting: Neurology

## 2013-01-31 DIAGNOSIS — G8190 Hemiplegia, unspecified affecting unspecified side: Secondary | ICD-10-CM

## 2013-01-31 DIAGNOSIS — G9349 Other encephalopathy: Secondary | ICD-10-CM

## 2013-02-06 ENCOUNTER — Encounter (HOSPITAL_COMMUNITY): Payer: Medicare Other | Attending: Oncology

## 2013-02-06 DIAGNOSIS — D696 Thrombocytopenia, unspecified: Secondary | ICD-10-CM | POA: Insufficient documentation

## 2013-02-06 DIAGNOSIS — D509 Iron deficiency anemia, unspecified: Secondary | ICD-10-CM | POA: Insufficient documentation

## 2013-02-06 DIAGNOSIS — K7460 Unspecified cirrhosis of liver: Secondary | ICD-10-CM | POA: Insufficient documentation

## 2013-02-06 LAB — IRON AND TIBC
Iron: 90 ug/dL (ref 42–135)
Saturation Ratios: 24 % (ref 20–55)
TIBC: 371 ug/dL (ref 215–435)
UIBC: 281 ug/dL (ref 125–400)

## 2013-02-06 LAB — COMPREHENSIVE METABOLIC PANEL
Albumin: 4.2 g/dL (ref 3.5–5.2)
BUN: 13 mg/dL (ref 6–23)
Calcium: 9.9 mg/dL (ref 8.4–10.5)
Creatinine, Ser: 0.99 mg/dL (ref 0.50–1.35)
Total Bilirubin: 0.5 mg/dL (ref 0.3–1.2)
Total Protein: 7.7 g/dL (ref 6.0–8.3)

## 2013-02-06 LAB — CBC WITH DIFFERENTIAL/PLATELET
Basophils Relative: 0 % (ref 0–1)
Eosinophils Absolute: 0.1 10*3/uL (ref 0.0–0.7)
Eosinophils Relative: 2 % (ref 0–5)
HCT: 37.5 % — ABNORMAL LOW (ref 39.0–52.0)
Hemoglobin: 12.3 g/dL — ABNORMAL LOW (ref 13.0–17.0)
MCH: 33 pg (ref 26.0–34.0)
MCHC: 32.8 g/dL (ref 30.0–36.0)
Monocytes Absolute: 0.3 10*3/uL (ref 0.1–1.0)
Monocytes Relative: 7 % (ref 3–12)
RDW: 13.6 % (ref 11.5–15.5)

## 2013-02-06 LAB — FERRITIN: Ferritin: 140 ng/mL (ref 22–322)

## 2013-02-06 NOTE — Progress Notes (Signed)
Labs drawn today for cbc/diff,cmp,ferr,Iron and IBC 

## 2013-02-08 NOTE — Telephone Encounter (Signed)
Spoke with pts wife- they are going to do MRI of the brain and spine on 02/16/13 and she just wants [place] to contact them in October for the MRI of his liver.  Darl Pikes, please nic MRI- liver

## 2013-02-09 ENCOUNTER — Encounter (HOSPITAL_BASED_OUTPATIENT_CLINIC_OR_DEPARTMENT_OTHER): Payer: Medicare Other

## 2013-02-09 ENCOUNTER — Encounter (HOSPITAL_COMMUNITY): Payer: Self-pay

## 2013-02-09 VITALS — BP 100/65 | HR 77 | Temp 98.7°F | Resp 16 | Wt 158.4 lb

## 2013-02-09 DIAGNOSIS — K7460 Unspecified cirrhosis of liver: Secondary | ICD-10-CM

## 2013-02-09 DIAGNOSIS — R161 Splenomegaly, not elsewhere classified: Secondary | ICD-10-CM

## 2013-02-09 DIAGNOSIS — D696 Thrombocytopenia, unspecified: Secondary | ICD-10-CM | POA: Diagnosis not present

## 2013-02-09 DIAGNOSIS — D509 Iron deficiency anemia, unspecified: Secondary | ICD-10-CM | POA: Diagnosis not present
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
## 2013-02-09 NOTE — Patient Instructions (Addendum)
[facility] Discharge Instructions  RECOMMENDATIONS MADE BY THE CONSULTANT AND ANY TEST RESULTS WILL BE SENT TO YOUR REFERRING PHYSICIAN.  Lab work again in 3 months. MD appointment after lab work in 3 months.  Thank you for choosing [facility] to provide your oncology and hematology care.  To afford each patient quality time with our providers, please arrive at least 15 minutes before your scheduled appointment time.  With your help, our goal is to use those 15 minutes to complete the necessary work-up to ensure our physicians have the information they need to help with your evaluation and healthcare recommendations.    Effective January 1st, 2014, we ask that you re-schedule your appointment with our physicians should you arrive 10 or more minutes late for your appointment.  We strive to give you quality time with our providers, and arriving late affects you and other patients whose appointments are after yours.    Again, thank you for choosing [facility].  Our hope is that these requests will decrease the amount of time that you wait before being seen by our physicians.       _____________________________________________________________  Should you have questions after your visit to [facility], please contact our office at [phone] between the hours of 8:30 a.m. and 5:00 p.m.  Voicemails left after 4:30 p.m. will not be returned until the following business day.  For prescription refill requests, have your pharmacy contact our office with your prescription refill request.

## 2013-02-09 NOTE — Telephone Encounter (Signed)
Reminder in epic °

## 2013-02-16 ENCOUNTER — Ambulatory Visit
Admission: RE | Admit: 2013-02-16 | Discharge: 2013-02-16 | Disposition: A | Discharge status: Home / Self Care | Payer: Medicare Other | Source: Ambulatory Visit | Attending: Neurology | Admitting: Neurology

## 2013-02-16 DIAGNOSIS — G8190 Hemiplegia, unspecified affecting unspecified side: Secondary | ICD-10-CM

## 2013-02-16 DIAGNOSIS — G9340 Encephalopathy, unspecified: Secondary | ICD-10-CM | POA: Diagnosis not present

## 2013-02-16 DIAGNOSIS — M47812 Spondylosis without myelopathy or radiculopathy, cervical region: Secondary | ICD-10-CM | POA: Diagnosis not present

## 2013-02-16 DIAGNOSIS — M4802 Spinal stenosis, cervical region: Secondary | ICD-10-CM | POA: Diagnosis not present

## 2013-02-16 DIAGNOSIS — G9349 Other encephalopathy: Secondary | ICD-10-CM

## 2013-03-22 DIAGNOSIS — Z79899 Other long term (current) drug therapy: Secondary | ICD-10-CM | POA: Diagnosis not present

## 2013-03-22 DIAGNOSIS — IMO0002 Reserved for concepts with insufficient information to code with codable children: Secondary | ICD-10-CM | POA: Diagnosis not present

## 2013-03-22 DIAGNOSIS — K7689 Other specified diseases of liver: Secondary | ICD-10-CM | POA: Diagnosis not present

## 2013-03-22 DIAGNOSIS — M5137 Other intervertebral disc degeneration, lumbosacral region: Secondary | ICD-10-CM | POA: Diagnosis not present

## 2013-03-22 DIAGNOSIS — M545 Low back pain, unspecified: Secondary | ICD-10-CM | POA: Diagnosis not present

## 2013-03-22 DIAGNOSIS — M47817 Spondylosis without myelopathy or radiculopathy, lumbosacral region: Secondary | ICD-10-CM | POA: Diagnosis not present

## 2013-03-22 DIAGNOSIS — K7290 Hepatic failure, unspecified without coma: Secondary | ICD-10-CM | POA: Diagnosis not present

## 2013-03-27 ENCOUNTER — Encounter: Payer: Self-pay | Admitting: Internal Medicine

## 2013-03-27 ENCOUNTER — Telehealth: Payer: Self-pay | Admitting: Internal Medicine

## 2013-03-27 NOTE — Telephone Encounter (Signed)
Letter has been mailed to the patient 

## 2013-03-27 NOTE — Telephone Encounter (Signed)
Pt is on  OCT recall for MRI in 4 months

## 2013-04-03 ENCOUNTER — Encounter: Payer: Self-pay | Admitting: Internal Medicine

## 2013-04-03 ENCOUNTER — Other Ambulatory Visit: Payer: Self-pay | Admitting: General Practice

## 2013-04-03 ENCOUNTER — Ambulatory Visit (INDEPENDENT_AMBULATORY_CARE_PROVIDER_SITE_OTHER): Payer: Medicare Other | Admitting: Internal Medicine

## 2013-04-03 VITALS — BP 94/59 | HR 84 | Temp 97.9°F | Ht 69.0 in | Wt 157.0 lb

## 2013-04-03 DIAGNOSIS — K7682 Hepatic encephalopathy: Secondary | ICD-10-CM

## 2013-04-03 DIAGNOSIS — Z139 Encounter for screening, unspecified: Secondary | ICD-10-CM

## 2013-04-03 DIAGNOSIS — K7460 Unspecified cirrhosis of liver: Secondary | ICD-10-CM

## 2013-04-03 DIAGNOSIS — K7290 Hepatic failure, unspecified without coma: Secondary | ICD-10-CM | POA: Diagnosis not present

## 2013-04-03 NOTE — Progress Notes (Signed)
Pt is scheduled 04/17/13@10 :15am, npo after 6am

## 2013-04-03 NOTE — Patient Instructions (Signed)
Followup with the GI doctors at [facility]  relating to nausea issues  We'll arrange an open MRI in the near future to reassess liver  Minimize the use of Phenergan for nausea  Continue Prilosec, lactulose and Inderal  Office visit with [place] in 6 months  Telephone followup once MRI results are known.
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
## 2013-04-04 ENCOUNTER — Encounter: Payer: Self-pay | Admitting: Internal Medicine

## 2013-04-17 ENCOUNTER — Ambulatory Visit
Admission: RE | Admit: 2013-04-17 | Discharge: 2013-04-17 | Disposition: A | Discharge status: Home / Self Care | Payer: Medicare Other | Source: Ambulatory Visit | Attending: Internal Medicine | Admitting: Internal Medicine

## 2013-04-17 DIAGNOSIS — K7460 Unspecified cirrhosis of liver: Secondary | ICD-10-CM

## 2013-04-17 MED ORDER — GADOBENATE DIMEGLUMINE 529 MG/ML IV SOLN
14.0000 mL | Freq: Once | INTRAVENOUS | Status: AC | PRN
  Administered 2013-04-17: 14 mL via INTRAVENOUS

## 2013-04-23 NOTE — Progress Notes (Signed)
Reminder in epic °

## 2013-04-29 ENCOUNTER — Emergency Department (HOSPITAL_COMMUNITY): Payer: Medicare Other

## 2013-04-29 ENCOUNTER — Emergency Department (HOSPITAL_COMMUNITY)
Admission: EM | Admit: 2013-04-29 | Discharge: 2013-04-29 | Disposition: A | Discharge status: Home / Self Care | Payer: Medicare Other | Attending: Emergency Medicine | Admitting: Emergency Medicine

## 2013-04-29 ENCOUNTER — Encounter (HOSPITAL_COMMUNITY): Payer: Self-pay | Admitting: Emergency Medicine

## 2013-04-29 DIAGNOSIS — F329 Major depressive disorder, single episode, unspecified: Secondary | ICD-10-CM | POA: Diagnosis not present

## 2013-04-29 DIAGNOSIS — Z8619 Personal history of other infectious and parasitic diseases: Secondary | ICD-10-CM | POA: Insufficient documentation

## 2013-04-29 DIAGNOSIS — G8929 Other chronic pain: Secondary | ICD-10-CM | POA: Insufficient documentation

## 2013-04-29 DIAGNOSIS — Z880 Allergy status to penicillin: Secondary | ICD-10-CM | POA: Diagnosis not present

## 2013-04-29 DIAGNOSIS — M7981 Nontraumatic hematoma of soft tissue: Secondary | ICD-10-CM | POA: Diagnosis not present

## 2013-04-29 DIAGNOSIS — Z794 Long term (current) use of insulin: Secondary | ICD-10-CM | POA: Diagnosis not present

## 2013-04-29 DIAGNOSIS — Z7982 Long term (current) use of aspirin: Secondary | ICD-10-CM | POA: Diagnosis not present

## 2013-04-29 DIAGNOSIS — S9030XA Contusion of unspecified foot, initial encounter: Secondary | ICD-10-CM | POA: Diagnosis not present

## 2013-04-29 DIAGNOSIS — R220 Localized swelling, mass and lump, head: Secondary | ICD-10-CM | POA: Insufficient documentation

## 2013-04-29 DIAGNOSIS — T148XXA Other injury of unspecified body region, initial encounter: Secondary | ICD-10-CM

## 2013-04-29 DIAGNOSIS — Z8673 Personal history of transient ischemic attack (TIA), and cerebral infarction without residual deficits: Secondary | ICD-10-CM | POA: Insufficient documentation

## 2013-04-29 DIAGNOSIS — X58XXXA Exposure to other specified factors, initial encounter: Secondary | ICD-10-CM | POA: Insufficient documentation

## 2013-04-29 DIAGNOSIS — Y929 Unspecified place or not applicable: Secondary | ICD-10-CM | POA: Insufficient documentation

## 2013-04-29 DIAGNOSIS — I10 Essential (primary) hypertension: Secondary | ICD-10-CM | POA: Diagnosis not present

## 2013-04-29 DIAGNOSIS — Z79899 Other long term (current) drug therapy: Secondary | ICD-10-CM | POA: Insufficient documentation

## 2013-04-29 DIAGNOSIS — F411 Generalized anxiety disorder: Secondary | ICD-10-CM | POA: Insufficient documentation

## 2013-04-29 DIAGNOSIS — G43909 Migraine, unspecified, not intractable, without status migrainosus: Secondary | ICD-10-CM | POA: Insufficient documentation

## 2013-04-29 DIAGNOSIS — K219 Gastro-esophageal reflux disease without esophagitis: Secondary | ICD-10-CM | POA: Diagnosis not present

## 2013-04-29 DIAGNOSIS — E119 Type 2 diabetes mellitus without complications: Secondary | ICD-10-CM | POA: Insufficient documentation

## 2013-04-29 DIAGNOSIS — Z87891 Personal history of nicotine dependence: Secondary | ICD-10-CM | POA: Insufficient documentation

## 2013-04-29 DIAGNOSIS — D509 Iron deficiency anemia, unspecified: Secondary | ICD-10-CM | POA: Insufficient documentation

## 2013-04-29 DIAGNOSIS — F3289 Other specified depressive episodes: Secondary | ICD-10-CM | POA: Insufficient documentation

## 2013-04-29 DIAGNOSIS — E785 Hyperlipidemia, unspecified: Secondary | ICD-10-CM | POA: Insufficient documentation

## 2013-04-29 DIAGNOSIS — Y939 Activity, unspecified: Secondary | ICD-10-CM | POA: Insufficient documentation

## 2013-04-29 HISTORY — DX: Cyst of pancreas: K86.2

## 2013-04-29 LAB — BASIC METABOLIC PANEL
Chloride: 101 mEq/L (ref 96–112)
GFR calc Af Amer: 77 mL/min — ABNORMAL LOW (ref >90)
GFR calc non Af Amer: 66 mL/min — ABNORMAL LOW (ref >90)
Glucose, Bld: 85 mg/dL (ref 70–99)
Potassium: 4.6 mEq/L (ref 3.5–5.1)
Sodium: 135 mEq/L (ref 135–145)

## 2013-04-29 LAB — CBC WITH DIFFERENTIAL/PLATELET
Basophils Absolute: 0 10*3/uL (ref 0.0–0.1)
Eosinophils Absolute: 0.1 10*3/uL (ref 0.0–0.7)
Hemoglobin: 12.9 g/dL — ABNORMAL LOW (ref 13.0–17.0)
Lymphocytes Relative: 27 % (ref 12–46)
Lymphs Abs: 1.2 10*3/uL (ref 0.7–4.0)
Neutro Abs: 2.7 10*3/uL (ref 1.7–7.7)
Neutrophils Relative %: 63 % (ref 43–77)
Platelets: 100 10*3/uL — ABNORMAL LOW (ref 150–400)
RBC: 3.96 MIL/uL — ABNORMAL LOW (ref 4.22–5.81)
RDW: 13.7 % (ref 11.5–15.5)
WBC: 4.2 10*3/uL (ref 4.0–10.5)

## 2013-04-29 NOTE — ED Notes (Addendum)
Pt states that he was waken up by his right foot throbbing during the night, went to look at his foot and noticed that the top part of his foot was blue. Pt has ?bruise noted to top of foot area, positive pedal pulse noted, good cap refill. Denies any injury. Pt also concerned because he he felt like he a golf ball in his throat for the past three weeks,

## 2013-04-29 NOTE — ED Notes (Signed)
Pt presents with large amount of bruising on top of rt foot. No bruising noted to toes. Pulses equal and strong bilaterally.  Brisk cap refill noted. Pt also c/o feeling like he has a golf ball in his throat. Denies difficulty breathing and swallowing at this time.
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
## 2013-05-03 DIAGNOSIS — G894 Chronic pain syndrome: Secondary | ICD-10-CM | POA: Diagnosis not present

## 2013-05-03 DIAGNOSIS — M5137 Other intervertebral disc degeneration, lumbosacral region: Secondary | ICD-10-CM | POA: Diagnosis not present

## 2013-05-03 DIAGNOSIS — M545 Low back pain, unspecified: Secondary | ICD-10-CM | POA: Diagnosis not present

## 2013-05-03 DIAGNOSIS — M47817 Spondylosis without myelopathy or radiculopathy, lumbosacral region: Secondary | ICD-10-CM | POA: Diagnosis not present

## 2013-05-03 DIAGNOSIS — Z5181 Encounter for therapeutic drug level monitoring: Secondary | ICD-10-CM | POA: Diagnosis not present

## 2013-05-03 DIAGNOSIS — Z79899 Other long term (current) drug therapy: Secondary | ICD-10-CM | POA: Diagnosis not present

## 2013-05-07 ENCOUNTER — Encounter (HOSPITAL_COMMUNITY): Payer: Medicare Other | Attending: Oncology

## 2013-05-07 DIAGNOSIS — R911 Solitary pulmonary nodule: Secondary | ICD-10-CM | POA: Diagnosis not present

## 2013-05-07 DIAGNOSIS — K7460 Unspecified cirrhosis of liver: Secondary | ICD-10-CM | POA: Insufficient documentation

## 2013-05-07 DIAGNOSIS — Z0000 Encounter for general adult medical examination without abnormal findings: Secondary | ICD-10-CM | POA: Diagnosis not present

## 2013-05-07 DIAGNOSIS — D509 Iron deficiency anemia, unspecified: Secondary | ICD-10-CM | POA: Insufficient documentation

## 2013-05-07 LAB — CBC
HCT: 39.9 % (ref 39.0–52.0)
Hemoglobin: 13.4 g/dL (ref 13.0–17.0)
MCH: 32.6 pg (ref 26.0–34.0)
RBC: 4.11 MIL/uL — ABNORMAL LOW (ref 4.22–5.81)

## 2013-05-07 LAB — FERRITIN: Ferritin: 50 ng/mL (ref 22–322)

## 2013-05-07 NOTE — Progress Notes (Signed)
Labs drawn today for cbc,ferr 

## 2013-05-10 MED ORDER — SODIUM CHLORIDE 0.9 % IV SOLN: 1020.0000 mg | Freq: Once | INTRAVENOUS | Status: DC
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
## 2013-05-11 ENCOUNTER — Encounter (HOSPITAL_COMMUNITY): Payer: Self-pay | Admitting: Oncology

## 2013-05-11 ENCOUNTER — Encounter (HOSPITAL_BASED_OUTPATIENT_CLINIC_OR_DEPARTMENT_OTHER): Payer: Medicare Other | Admitting: Oncology

## 2013-05-11 VITALS — BP 89/52 | HR 73 | Temp 97.9°F | Resp 16 | Wt 152.1 lb

## 2013-05-11 DIAGNOSIS — K7460 Unspecified cirrhosis of liver: Secondary | ICD-10-CM

## 2013-05-11 DIAGNOSIS — G8929 Other chronic pain: Secondary | ICD-10-CM | POA: Diagnosis not present

## 2013-05-11 DIAGNOSIS — D509 Iron deficiency anemia, unspecified: Secondary | ICD-10-CM

## 2013-05-11 DIAGNOSIS — Z23 Encounter for immunization: Secondary | ICD-10-CM | POA: Diagnosis not present

## 2013-05-11 DIAGNOSIS — Z0000 Encounter for general adult medical examination without abnormal findings: Secondary | ICD-10-CM

## 2013-05-11 DIAGNOSIS — R911 Solitary pulmonary nodule: Secondary | ICD-10-CM

## 2013-05-11 DIAGNOSIS — D696 Thrombocytopenia, unspecified: Secondary | ICD-10-CM | POA: Diagnosis not present

## 2013-05-11 MED ORDER — INFLUENZA VAC SPLIT QUAD 0.5 ML IM SUSP
0.5000 mL | Freq: Once | INTRAMUSCULAR | Status: AC
  Administered 2013-05-11: 0.5 mL via INTRAMUSCULAR
  Filled 2013-05-11: qty 0.5

## 2013-05-11 NOTE — Progress Notes (Signed)
Erlinda Hong presents today for injection per MD orders. Flu vaccine administered IM in left Upper Arm. Administration without incident. Patient tolerated well.

## 2013-05-11 NOTE — Patient Instructions (Signed)
[facility] Discharge Instructions  RECOMMENDATIONS MADE BY THE CONSULTANT AND ANY TEST RESULTS WILL BE SENT TO YOUR REFERRING PHYSICIAN.  EXAM FINDINGS BY THE PHYSICIAN TODAY AND SIGNS OR SYMPTOMS TO REPORT TO CLINIC OR PRIMARY PHYSICIAN: Exam and findings as discussed by Dellis Anes, PA-C. Will give you an iron infusion next week.    MEDICATIONS PRESCRIBED:  none  INSTRUCTIONS/FOLLOW-UP: Feraheme infusion next week Blood work and follow-up in 3 months CT of chest in July.  Thank you for choosing [facility] to provide your oncology and hematology care.  To afford each patient quality time with our providers, please arrive at least 15 minutes before your scheduled appointment time.  With your help, our goal is to use those 15 minutes to complete the necessary work-up to ensure our physicians have the information they need to help with your evaluation and healthcare recommendations.    Effective January 1st, 2014, we ask that you re-schedule your appointment with our physicians should you arrive 10 or more minutes late for your appointment.  We strive to give you quality time with our providers, and arriving late affects you and other patients whose appointments are after yours.    Again, thank you for choosing [facility].  Our hope is that these requests will decrease the amount of time that you wait before being seen by our physicians.       _____________________________________________________________  Should you have questions after your visit to [facility], please contact our office at [phone] between the hours of 8:30 a.m. and 5:00 p.m.  Voicemails left after 4:30 p.m. will not be returned until the following business day.  For prescription refill requests, have your pharmacy contact our office with your prescription refill request.

## 2013-05-16 ENCOUNTER — Encounter (HOSPITAL_BASED_OUTPATIENT_CLINIC_OR_DEPARTMENT_OTHER): Payer: Medicare Other

## 2013-05-16 VITALS — BP 104/69 | HR 83 | Temp 97.7°F | Resp 18

## 2013-05-16 DIAGNOSIS — Z0000 Encounter for general adult medical examination without abnormal findings: Secondary | ICD-10-CM | POA: Diagnosis not present

## 2013-05-16 DIAGNOSIS — D509 Iron deficiency anemia, unspecified: Secondary | ICD-10-CM | POA: Diagnosis not present

## 2013-05-16 DIAGNOSIS — K7460 Unspecified cirrhosis of liver: Secondary | ICD-10-CM

## 2013-05-16 DIAGNOSIS — R911 Solitary pulmonary nodule: Secondary | ICD-10-CM | POA: Diagnosis not present

## 2013-05-16 MED ORDER — SODIUM CHLORIDE 0.9 % IV SOLN
Freq: Once | INTRAVENOUS | Status: AC
  Administered 2013-05-16: 12:00:00 via INTRAVENOUS

## 2013-05-16 MED ORDER — SODIUM CHLORIDE 0.9 % IV SOLN
1020.0000 mg | Freq: Once | INTRAVENOUS | Status: AC
  Administered 2013-05-16: 1020 mg via INTRAVENOUS
  Filled 2013-05-16: qty 34

## 2013-05-16 NOTE — Progress Notes (Signed)
.  Brad Wall Presents today for iron infusion. Tolerated well.

## 2013-05-17 LAB — AFP TUMOR MARKER: AFP-Tumor Marker: 7 ng/mL (ref 0.0–8.0)

## 2013-05-29 DIAGNOSIS — K219 Gastro-esophageal reflux disease without esophagitis: Secondary | ICD-10-CM | POA: Diagnosis not present

## 2013-05-29 DIAGNOSIS — K2100 Gastro-esophageal reflux disease with esophagitis, without bleeding: Secondary | ICD-10-CM | POA: Diagnosis not present

## 2013-05-29 DIAGNOSIS — R112 Nausea with vomiting, unspecified: Secondary | ICD-10-CM | POA: Diagnosis not present

## 2013-05-29 DIAGNOSIS — K6389 Other specified diseases of intestine: Secondary | ICD-10-CM | POA: Diagnosis not present

## 2013-06-01 ENCOUNTER — Other Ambulatory Visit: Payer: Self-pay | Admitting: Gastroenterology

## 2013-06-01 MED ORDER — LACTULOSE 10 GM/15ML PO SOLN: 20.0000 g | Freq: Two times a day (BID) | ORAL | Status: DC

## 2013-06-04 DIAGNOSIS — K7689 Other specified diseases of liver: Secondary | ICD-10-CM | POA: Diagnosis not present

## 2013-06-04 DIAGNOSIS — Z79899 Other long term (current) drug therapy: Secondary | ICD-10-CM | POA: Diagnosis not present

## 2013-06-04 DIAGNOSIS — T50905S Adverse effect of unspecified drugs, medicaments and biological substances, sequela: Secondary | ICD-10-CM | POA: Diagnosis not present

## 2013-06-04 DIAGNOSIS — R1011 Right upper quadrant pain: Secondary | ICD-10-CM | POA: Diagnosis not present

## 2013-06-04 DIAGNOSIS — G894 Chronic pain syndrome: Secondary | ICD-10-CM | POA: Diagnosis not present

## 2013-06-04 DIAGNOSIS — IMO0002 Reserved for concepts with insufficient information to code with codable children: Secondary | ICD-10-CM | POA: Diagnosis not present

## 2013-06-04 DIAGNOSIS — Z5181 Encounter for therapeutic drug level monitoring: Secondary | ICD-10-CM | POA: Diagnosis not present

## 2013-06-14 ENCOUNTER — Other Ambulatory Visit: Payer: Self-pay | Admitting: Gastroenterology

## 2013-06-14 MED ORDER — PROPRANOLOL HCL 20 MG PO TABS: 20.0000 mg | ORAL_TABLET | Freq: Two times a day (BID) | ORAL | Status: DC

## 2013-07-02 DIAGNOSIS — J209 Acute bronchitis, unspecified: Secondary | ICD-10-CM | POA: Diagnosis not present

## 2013-07-02 DIAGNOSIS — F411 Generalized anxiety disorder: Secondary | ICD-10-CM | POA: Diagnosis not present

## 2013-07-02 DIAGNOSIS — E781 Pure hyperglyceridemia: Secondary | ICD-10-CM | POA: Diagnosis not present

## 2013-07-02 DIAGNOSIS — I10 Essential (primary) hypertension: Secondary | ICD-10-CM | POA: Diagnosis not present

## 2013-07-11 ENCOUNTER — Other Ambulatory Visit: Payer: Self-pay | Admitting: Gastroenterology

## 2013-08-07 DIAGNOSIS — IMO0002 Reserved for concepts with insufficient information to code with codable children: Secondary | ICD-10-CM | POA: Diagnosis not present

## 2013-08-07 DIAGNOSIS — G894 Chronic pain syndrome: Secondary | ICD-10-CM | POA: Diagnosis not present

## 2013-08-07 DIAGNOSIS — T50905S Adverse effect of unspecified drugs, medicaments and biological substances, sequela: Secondary | ICD-10-CM | POA: Diagnosis not present

## 2013-08-07 DIAGNOSIS — R1011 Right upper quadrant pain: Secondary | ICD-10-CM | POA: Diagnosis not present

## 2013-08-07 DIAGNOSIS — Z5181 Encounter for therapeutic drug level monitoring: Secondary | ICD-10-CM | POA: Diagnosis not present

## 2013-08-07 DIAGNOSIS — Z79899 Other long term (current) drug therapy: Secondary | ICD-10-CM | POA: Diagnosis not present

## 2013-08-07 DIAGNOSIS — K7689 Other specified diseases of liver: Secondary | ICD-10-CM | POA: Diagnosis not present

## 2013-08-10 ENCOUNTER — Encounter (HOSPITAL_COMMUNITY): Payer: Medicare Other | Attending: Oncology

## 2013-08-10 DIAGNOSIS — D509 Iron deficiency anemia, unspecified: Secondary | ICD-10-CM | POA: Insufficient documentation

## 2013-08-10 DIAGNOSIS — D696 Thrombocytopenia, unspecified: Secondary | ICD-10-CM

## 2013-08-10 LAB — CBC WITH DIFFERENTIAL/PLATELET
BASOS ABS: 0 10*3/uL (ref 0.0–0.1)
Basophils Relative: 1 % (ref 0–1)
EOS ABS: 0.1 10*3/uL (ref 0.0–0.7)
Eosinophils Relative: 3 % (ref 0–5)
HEMATOCRIT: 37.8 % — AB (ref 39.0–52.0)
Hemoglobin: 12.8 g/dL — ABNORMAL LOW (ref 13.0–17.0)
LYMPHS PCT: 23 % (ref 12–46)
Lymphs Abs: 0.9 10*3/uL (ref 0.7–4.0)
MCH: 33.5 pg (ref 26.0–34.0)
MCHC: 33.9 g/dL (ref 30.0–36.0)
MCV: 99 fL (ref 78.0–100.0)
MONO ABS: 0.3 10*3/uL (ref 0.1–1.0)
Monocytes Relative: 8 % (ref 3–12)
Neutro Abs: 2.6 10*3/uL (ref 1.7–7.7)
Neutrophils Relative %: 66 % (ref 43–77)
PLATELETS: 86 10*3/uL — AB (ref 150–400)
RBC: 3.82 MIL/uL — ABNORMAL LOW (ref 4.22–5.81)
RDW: 13.6 % (ref 11.5–15.5)
WBC: 3.9 10*3/uL — AB (ref 4.0–10.5)

## 2013-08-10 LAB — FERRITIN: Ferritin: 90 ng/mL (ref 22–322)

## 2013-08-10 NOTE — Progress Notes (Signed)
Labs drawn today for cbc/diff,ferr 

## 2013-08-14 ENCOUNTER — Ambulatory Visit (HOSPITAL_COMMUNITY): Payer: Medicare Other | Admitting: Oncology

## 2013-08-15 NOTE — Progress Notes (Signed)
Re schedued

## 2013-08-16 ENCOUNTER — Ambulatory Visit (HOSPITAL_COMMUNITY): Payer: Medicare Other | Admitting: Oncology

## 2013-08-23 NOTE — Progress Notes (Signed)
-  Rescheduled due to inclement weather-  Mandi Mattioli   

## 2013-08-24 ENCOUNTER — Ambulatory Visit (HOSPITAL_COMMUNITY): Payer: Medicare Other | Admitting: Oncology

## 2013-08-28 DIAGNOSIS — J209 Acute bronchitis, unspecified: Secondary | ICD-10-CM | POA: Diagnosis not present

## 2013-08-28 DIAGNOSIS — E1149 Type 2 diabetes mellitus with other diabetic neurological complication: Secondary | ICD-10-CM | POA: Diagnosis not present

## 2013-08-28 DIAGNOSIS — I10 Essential (primary) hypertension: Secondary | ICD-10-CM | POA: Diagnosis not present

## 2013-08-28 DIAGNOSIS — E781 Pure hyperglyceridemia: Secondary | ICD-10-CM | POA: Diagnosis not present

## 2013-08-28 DIAGNOSIS — F411 Generalized anxiety disorder: Secondary | ICD-10-CM | POA: Diagnosis not present

## 2013-09-03 ENCOUNTER — Other Ambulatory Visit (HOSPITAL_COMMUNITY)
Admission: RE | Admit: 2013-09-03 | Discharge: 2013-09-03 | Disposition: A | Discharge status: Home / Self Care | Payer: Medicare Other | Source: Ambulatory Visit | Attending: Family Medicine | Admitting: Family Medicine

## 2013-09-03 DIAGNOSIS — L821 Other seborrheic keratosis: Secondary | ICD-10-CM | POA: Diagnosis not present

## 2013-09-03 DIAGNOSIS — L989 Disorder of the skin and subcutaneous tissue, unspecified: Secondary | ICD-10-CM | POA: Diagnosis not present

## 2013-09-05 DIAGNOSIS — R51 Headache: Secondary | ICD-10-CM | POA: Diagnosis not present

## 2013-09-05 DIAGNOSIS — G894 Chronic pain syndrome: Secondary | ICD-10-CM | POA: Diagnosis not present

## 2013-09-05 DIAGNOSIS — M79609 Pain in unspecified limb: Secondary | ICD-10-CM | POA: Diagnosis not present

## 2013-09-05 DIAGNOSIS — K7682 Hepatic encephalopathy: Secondary | ICD-10-CM | POA: Diagnosis not present

## 2013-09-05 DIAGNOSIS — K7290 Hepatic failure, unspecified without coma: Secondary | ICD-10-CM | POA: Diagnosis not present

## 2013-09-05 DIAGNOSIS — Z5181 Encounter for therapeutic drug level monitoring: Secondary | ICD-10-CM | POA: Diagnosis not present
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
## 2013-09-07 ENCOUNTER — Encounter (HOSPITAL_COMMUNITY): Payer: Self-pay | Admitting: Oncology

## 2013-09-07 ENCOUNTER — Encounter (HOSPITAL_COMMUNITY): Payer: Medicare Other | Attending: Oncology | Admitting: Oncology

## 2013-09-07 VITALS — BP 111/72 | HR 96 | Temp 98.8°F | Resp 18 | Wt 157.9 lb

## 2013-09-07 DIAGNOSIS — R911 Solitary pulmonary nodule: Secondary | ICD-10-CM

## 2013-09-07 DIAGNOSIS — Z8673 Personal history of transient ischemic attack (TIA), and cerebral infarction without residual deficits: Secondary | ICD-10-CM

## 2013-09-07 DIAGNOSIS — K7460 Unspecified cirrhosis of liver: Secondary | ICD-10-CM

## 2013-09-07 DIAGNOSIS — D6959 Other secondary thrombocytopenia: Secondary | ICD-10-CM

## 2013-09-07 DIAGNOSIS — R161 Splenomegaly, not elsewhere classified: Secondary | ICD-10-CM | POA: Diagnosis not present

## 2013-09-07 DIAGNOSIS — D509 Iron deficiency anemia, unspecified: Secondary | ICD-10-CM | POA: Diagnosis not present

## 2013-09-07 DIAGNOSIS — D696 Thrombocytopenia, unspecified: Secondary | ICD-10-CM

## 2013-09-07 NOTE — Patient Instructions (Signed)
Brad Wall Discharge Instructions  RECOMMENDATIONS MADE BY THE CONSULTANT AND ANY TEST RESULTS WILL BE SENT TO YOUR REFERRING PHYSICIAN.  EXAM FINDINGS BY THE PHYSICIAN TODAY AND SIGNS OR SYMPTOMS TO REPORT TO CLINIC OR PRIMARY PHYSICIAN: Exam and findings as discussed by Robynn Pane, PA-C.  Your lab work is stable.  Report unusual bruising or bleeding.  MEDICATIONS PRESCRIBED:  none  INSTRUCTIONS/FOLLOW-UP: Follow-up with lab work in April and July.  To be seen in clinic after Scans in July  Thank you for choosing [facility] to provide your oncology and hematology care.  To afford each patient quality time with our providers, please arrive at least 15 minutes before your scheduled appointment time.  With your help, our goal is to use those 15 minutes to complete the necessary work-up to ensure our physicians have the information they need to help with your evaluation and healthcare recommendations.    Effective January 1st, 2014, we ask that you re-schedule your appointment with our physicians should you arrive 10 or more minutes late for your appointment.  We strive to give you quality time with our providers, and arriving late affects you and other patients whose appointments are after yours.    Again, thank you for choosing [facility].  Our hope is that these requests will decrease the amount of time that you wait before being seen by our physicians.       _____________________________________________________________  Should you have questions after your visit to [facility], please contact our office at [phone] between the hours of 8:30 a.m. and 5:00 p.m.  Voicemails left after 4:30 p.m. will not be returned until the following business day.  For prescription refill requests, have your pharmacy contact our office with your prescription refill request.

## 2013-09-12 ENCOUNTER — Other Ambulatory Visit: Payer: Self-pay | Admitting: Gastroenterology

## 2013-09-19 DIAGNOSIS — L821 Other seborrheic keratosis: Secondary | ICD-10-CM | POA: Diagnosis not present

## 2013-10-05 DIAGNOSIS — Z79899 Other long term (current) drug therapy: Secondary | ICD-10-CM | POA: Diagnosis not present

## 2013-10-05 DIAGNOSIS — K7689 Other specified diseases of liver: Secondary | ICD-10-CM | POA: Diagnosis not present

## 2013-10-05 DIAGNOSIS — M5430 Sciatica, unspecified side: Secondary | ICD-10-CM | POA: Diagnosis not present

## 2013-10-05 DIAGNOSIS — Z5181 Encounter for therapeutic drug level monitoring: Secondary | ICD-10-CM | POA: Diagnosis not present

## 2013-10-05 DIAGNOSIS — R1011 Right upper quadrant pain: Secondary | ICD-10-CM | POA: Diagnosis not present

## 2013-10-05 DIAGNOSIS — G894 Chronic pain syndrome: Secondary | ICD-10-CM | POA: Diagnosis not present

## 2013-10-05 DIAGNOSIS — IMO0002 Reserved for concepts with insufficient information to code with codable children: Secondary | ICD-10-CM | POA: Diagnosis not present

## 2013-10-24 ENCOUNTER — Encounter (HOSPITAL_COMMUNITY): Payer: Medicare Other | Attending: Oncology

## 2013-10-24 DIAGNOSIS — D509 Iron deficiency anemia, unspecified: Secondary | ICD-10-CM | POA: Insufficient documentation

## 2013-10-24 DIAGNOSIS — D696 Thrombocytopenia, unspecified: Secondary | ICD-10-CM | POA: Insufficient documentation

## 2013-10-24 LAB — CBC
HCT: 39.3 % (ref 39.0–52.0)
HEMOGLOBIN: 12.9 g/dL — AB (ref 13.0–17.0)
MCH: 32.4 pg (ref 26.0–34.0)
MCHC: 32.8 g/dL (ref 30.0–36.0)
MCV: 98.7 fL (ref 78.0–100.0)
PLATELETS: 96 10*3/uL — AB (ref 150–400)
RBC: 3.98 MIL/uL — ABNORMAL LOW (ref 4.22–5.81)
RDW: 14.1 % (ref 11.5–15.5)
WBC: 3.9 10*3/uL — ABNORMAL LOW (ref 4.0–10.5)

## 2013-10-24 LAB — FERRITIN: FERRITIN: 73 ng/mL (ref 22–322)

## 2013-10-24 NOTE — Progress Notes (Signed)
Labs drawn today for cbc/diff,ferr 

## 2013-11-07 DIAGNOSIS — R21 Rash and other nonspecific skin eruption: Secondary | ICD-10-CM | POA: Diagnosis not present

## 2013-11-07 DIAGNOSIS — E782 Mixed hyperlipidemia: Secondary | ICD-10-CM | POA: Diagnosis not present

## 2013-11-07 DIAGNOSIS — E119 Type 2 diabetes mellitus without complications: Secondary | ICD-10-CM | POA: Diagnosis not present

## 2013-11-07 DIAGNOSIS — M25559 Pain in unspecified hip: Secondary | ICD-10-CM | POA: Diagnosis not present

## 2013-11-09 ENCOUNTER — Emergency Department (HOSPITAL_COMMUNITY)
Admission: EM | Admit: 2013-11-09 | Discharge: 2013-11-10 | Disposition: A | Discharge status: Home / Self Care | Payer: Medicare Other | Attending: Emergency Medicine | Admitting: Emergency Medicine

## 2013-11-09 ENCOUNTER — Encounter (HOSPITAL_COMMUNITY): Payer: Self-pay | Admitting: Emergency Medicine

## 2013-11-09 DIAGNOSIS — R1032 Left lower quadrant pain: Secondary | ICD-10-CM | POA: Diagnosis not present

## 2013-11-09 DIAGNOSIS — IMO0002 Reserved for concepts with insufficient information to code with codable children: Secondary | ICD-10-CM | POA: Diagnosis not present

## 2013-11-09 DIAGNOSIS — E119 Type 2 diabetes mellitus without complications: Secondary | ICD-10-CM | POA: Diagnosis not present

## 2013-11-09 DIAGNOSIS — G894 Chronic pain syndrome: Secondary | ICD-10-CM | POA: Diagnosis not present

## 2013-11-09 DIAGNOSIS — M5430 Sciatica, unspecified side: Secondary | ICD-10-CM | POA: Diagnosis not present

## 2013-11-09 DIAGNOSIS — I10 Essential (primary) hypertension: Secondary | ICD-10-CM | POA: Insufficient documentation

## 2013-11-09 DIAGNOSIS — R1900 Intra-abdominal and pelvic swelling, mass and lump, unspecified site: Secondary | ICD-10-CM | POA: Insufficient documentation

## 2013-11-09 DIAGNOSIS — G8929 Other chronic pain: Secondary | ICD-10-CM | POA: Diagnosis not present

## 2013-11-09 DIAGNOSIS — K7460 Unspecified cirrhosis of liver: Secondary | ICD-10-CM | POA: Diagnosis not present

## 2013-11-09 DIAGNOSIS — K7689 Other specified diseases of liver: Secondary | ICD-10-CM | POA: Diagnosis not present

## 2013-11-09 DIAGNOSIS — R197 Diarrhea, unspecified: Secondary | ICD-10-CM | POA: Diagnosis not present

## 2013-11-09 DIAGNOSIS — R1011 Right upper quadrant pain: Secondary | ICD-10-CM | POA: Diagnosis not present

## 2013-11-09 DIAGNOSIS — Z5189 Encounter for other specified aftercare: Secondary | ICD-10-CM | POA: Diagnosis not present

## 2013-11-09 NOTE — ED Notes (Signed)
Pt c/o llq pain and swelling since earlier this morning. Pt denies n/v but always has some slight diarrhea in the morning due to meds for his liver.

## 2013-11-12 ENCOUNTER — Emergency Department (HOSPITAL_COMMUNITY): Payer: Medicare Other

## 2013-11-12 ENCOUNTER — Telehealth: Payer: Self-pay

## 2013-11-12 ENCOUNTER — Encounter (HOSPITAL_COMMUNITY): Payer: Self-pay | Admitting: Emergency Medicine

## 2013-11-12 ENCOUNTER — Other Ambulatory Visit (HOSPITAL_COMMUNITY): Payer: Self-pay | Admitting: Internal Medicine

## 2013-11-12 ENCOUNTER — Emergency Department (HOSPITAL_COMMUNITY)
Admission: EM | Admit: 2013-11-12 | Discharge: 2013-11-12 | Disposition: A | Discharge status: Home / Self Care | Payer: Medicare Other | Attending: Emergency Medicine | Admitting: Emergency Medicine

## 2013-11-12 ENCOUNTER — Ambulatory Visit (HOSPITAL_COMMUNITY)
Admission: RE | Admit: 2013-11-12 | Discharge: 2013-11-12 | Disposition: A | Discharge status: Home / Self Care | Payer: Medicare Other | Source: Ambulatory Visit | Attending: Internal Medicine | Admitting: Internal Medicine

## 2013-11-12 DIAGNOSIS — R1032 Left lower quadrant pain: Secondary | ICD-10-CM | POA: Insufficient documentation

## 2013-11-12 DIAGNOSIS — Z9089 Acquired absence of other organs: Secondary | ICD-10-CM | POA: Insufficient documentation

## 2013-11-12 DIAGNOSIS — R109 Unspecified abdominal pain: Secondary | ICD-10-CM | POA: Diagnosis not present

## 2013-11-12 DIAGNOSIS — Z8619 Personal history of other infectious and parasitic diseases: Secondary | ICD-10-CM | POA: Diagnosis not present

## 2013-11-12 DIAGNOSIS — Z8669 Personal history of other diseases of the nervous system and sense organs: Secondary | ICD-10-CM | POA: Insufficient documentation

## 2013-11-12 DIAGNOSIS — E785 Hyperlipidemia, unspecified: Secondary | ICD-10-CM | POA: Insufficient documentation

## 2013-11-12 DIAGNOSIS — M25559 Pain in unspecified hip: Secondary | ICD-10-CM

## 2013-11-12 DIAGNOSIS — F411 Generalized anxiety disorder: Secondary | ICD-10-CM | POA: Insufficient documentation

## 2013-11-12 DIAGNOSIS — R110 Nausea: Secondary | ICD-10-CM | POA: Insufficient documentation

## 2013-11-12 DIAGNOSIS — G8929 Other chronic pain: Secondary | ICD-10-CM | POA: Diagnosis not present

## 2013-11-12 DIAGNOSIS — Z7982 Long term (current) use of aspirin: Secondary | ICD-10-CM | POA: Insufficient documentation

## 2013-11-12 DIAGNOSIS — Z79899 Other long term (current) drug therapy: Secondary | ICD-10-CM | POA: Diagnosis not present

## 2013-11-12 DIAGNOSIS — Z794 Long term (current) use of insulin: Secondary | ICD-10-CM | POA: Insufficient documentation

## 2013-11-12 DIAGNOSIS — D509 Iron deficiency anemia, unspecified: Secondary | ICD-10-CM | POA: Insufficient documentation

## 2013-11-12 DIAGNOSIS — K219 Gastro-esophageal reflux disease without esophagitis: Secondary | ICD-10-CM | POA: Diagnosis not present

## 2013-11-12 DIAGNOSIS — G43909 Migraine, unspecified, not intractable, without status migrainosus: Secondary | ICD-10-CM | POA: Diagnosis not present

## 2013-11-12 DIAGNOSIS — I10 Essential (primary) hypertension: Secondary | ICD-10-CM | POA: Insufficient documentation

## 2013-11-12 DIAGNOSIS — E119 Type 2 diabetes mellitus without complications: Secondary | ICD-10-CM | POA: Diagnosis not present

## 2013-11-12 DIAGNOSIS — K7460 Unspecified cirrhosis of liver: Secondary | ICD-10-CM | POA: Diagnosis not present

## 2013-11-12 DIAGNOSIS — Z8673 Personal history of transient ischemic attack (TIA), and cerebral infarction without residual deficits: Secondary | ICD-10-CM | POA: Insufficient documentation

## 2013-11-12 DIAGNOSIS — F3289 Other specified depressive episodes: Secondary | ICD-10-CM | POA: Insufficient documentation

## 2013-11-12 DIAGNOSIS — Z87891 Personal history of nicotine dependence: Secondary | ICD-10-CM | POA: Insufficient documentation

## 2013-11-12 DIAGNOSIS — F329 Major depressive disorder, single episode, unspecified: Secondary | ICD-10-CM | POA: Insufficient documentation

## 2013-11-12 DIAGNOSIS — Z880 Allergy status to penicillin: Secondary | ICD-10-CM | POA: Insufficient documentation

## 2013-11-12 LAB — CBC WITH DIFFERENTIAL/PLATELET
BASOS PCT: 1 % (ref 0–1)
Basophils Absolute: 0 10*3/uL (ref 0.0–0.1)
Eosinophils Absolute: 0.1 10*3/uL (ref 0.0–0.7)
Eosinophils Relative: 2 % (ref 0–5)
HCT: 35.8 % — ABNORMAL LOW (ref 39.0–52.0)
Hemoglobin: 12 g/dL — ABNORMAL LOW (ref 13.0–17.0)
Lymphocytes Relative: 22 % (ref 12–46)
Lymphs Abs: 0.9 10*3/uL (ref 0.7–4.0)
MCH: 32.6 pg (ref 26.0–34.0)
MCHC: 33.5 g/dL (ref 30.0–36.0)
MCV: 97.3 fL (ref 78.0–100.0)
Monocytes Absolute: 0.4 10*3/uL (ref 0.1–1.0)
Monocytes Relative: 9 % (ref 3–12)
NEUTROS ABS: 2.6 10*3/uL (ref 1.7–7.7)
NEUTROS PCT: 67 % (ref 43–77)
Platelets: 106 10*3/uL — ABNORMAL LOW (ref 150–400)
RBC: 3.68 MIL/uL — AB (ref 4.22–5.81)
RDW: 14 % (ref 11.5–15.5)
WBC: 3.9 10*3/uL — ABNORMAL LOW (ref 4.0–10.5)

## 2013-11-12 LAB — URINALYSIS, ROUTINE W REFLEX MICROSCOPIC
Bilirubin Urine: NEGATIVE
GLUCOSE, UA: NEGATIVE mg/dL
Hgb urine dipstick: NEGATIVE
Ketones, ur: NEGATIVE mg/dL
LEUKOCYTES UA: NEGATIVE
NITRITE: NEGATIVE
PH: 5.5 (ref 5.0–8.0)
Protein, ur: NEGATIVE mg/dL
SPECIFIC GRAVITY, URINE: 1.025 (ref 1.005–1.030)
Urobilinogen, UA: 0.2 mg/dL (ref 0.0–1.0)

## 2013-11-12 LAB — COMPREHENSIVE METABOLIC PANEL
ALBUMIN: 4 g/dL (ref 3.5–5.2)
ALK PHOS: 181 U/L — AB (ref 39–117)
ALT: 45 U/L (ref 0–53)
AST: 60 U/L — ABNORMAL HIGH (ref 0–37)
BILIRUBIN TOTAL: 0.3 mg/dL (ref 0.3–1.2)
BUN: 16 mg/dL (ref 6–23)
CO2: 21 mEq/L (ref 19–32)
Calcium: 9.4 mg/dL (ref 8.4–10.5)
Chloride: 103 mEq/L (ref 96–112)
Creatinine, Ser: 0.99 mg/dL (ref 0.50–1.35)
GFR calc Af Amer: >90 mL/min (ref >90)
GFR calc non Af Amer: 84 mL/min — ABNORMAL LOW (ref >90)
Glucose, Bld: 122 mg/dL — ABNORMAL HIGH (ref 70–99)
POTASSIUM: 4.5 meq/L (ref 3.7–5.3)
SODIUM: 138 meq/L (ref 137–147)
Total Protein: 7.4 g/dL (ref 6.0–8.3)

## 2013-11-12 LAB — PROTIME-INR
INR: 1.21 (ref 0.00–1.49)
Prothrombin Time: 15 seconds (ref 11.6–15.2)

## 2013-11-12 LAB — LIPASE, BLOOD: LIPASE: 28 U/L (ref 11–59)

## 2013-11-12 LAB — APTT: aPTT: 34 seconds (ref 24–37)

## 2013-11-12 MED ORDER — METOCLOPRAMIDE HCL 5 MG/ML IJ SOLN
5.0000 mg | Freq: Once | INTRAMUSCULAR | Status: AC
  Administered 2013-11-12: 5 mg via INTRAVENOUS
  Filled 2013-11-12: qty 2

## 2013-11-12 MED ORDER — HYDROMORPHONE HCL PF 1 MG/ML IJ SOLN
1.0000 mg | Freq: Once | INTRAMUSCULAR | Status: AC
  Administered 2013-11-12: 1 mg via INTRAVENOUS
  Filled 2013-11-12: qty 1

## 2013-11-12 MED ORDER — DIPHENHYDRAMINE HCL 50 MG/ML IJ SOLN
25.0000 mg | Freq: Once | INTRAMUSCULAR | Status: AC
  Administered 2013-11-12: 25 mg via INTRAVENOUS
  Filled 2013-11-12: qty 1

## 2013-11-12 MED ORDER — SODIUM CHLORIDE 0.9 % IV SOLN
INTRAVENOUS | Status: DC
  Administered 2013-11-12: 18:00:00 via INTRAVENOUS

## 2013-11-12 MED ORDER — IOHEXOL 300 MG/ML  SOLN
50.0000 mL | Freq: Once | INTRAMUSCULAR | Status: AC | PRN
  Administered 2013-11-12: 50 mL via ORAL

## 2013-11-12 MED ORDER — IOHEXOL 300 MG/ML  SOLN
100.0000 mL | Freq: Once | INTRAMUSCULAR | Status: AC | PRN
  Administered 2013-11-12: 100 mL via INTRAVENOUS
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
## 2013-11-12 NOTE — ED Notes (Signed)
abd pain hx of cirrhosis. Seen here 5/15 for same. Left prior to tx.  Nausea , vomiting.  Had OP x-ray of hip today.

## 2013-11-12 NOTE — Telephone Encounter (Signed)
If pain is an 8 (unexplained), he needs to go back to the ER

## 2013-11-12 NOTE — Telephone Encounter (Signed)
Pt's wife is aware to take him to the ER to get checked out

## 2013-11-12 NOTE — Telephone Encounter (Signed)
Pt when to the ER on Friday night and waited 4 hours without being seen. He could not sit there any longer because of the pain. His pain is from the right upper abd area under the rib. His pain level now is at 8. Wife is worried because he has cirrhosis. He is taking Oxycodone 10 for his pain but does not take it away. Her call back number is [phone]. Please advise

## 2013-11-12 NOTE — Discharge Instructions (Signed)
Abdominal Pain, Adult Many things can cause abdominal pain. Usually, abdominal pain is not caused by a disease and will improve without treatment. It can often be observed and treated at home. Your health care provider will do a physical exam and possibly order blood tests and X-rays to help determine the seriousness of your pain. However, in many cases, more time must pass before a clear cause of the pain can be found. Before that point, your health care provider may not know if you need more testing or further treatment. HOME CARE INSTRUCTIONS  Monitor your abdominal pain for any changes. The following actions may help to alleviate any discomfort you are experiencing:  Only take over-the-counter or prescription medicines as directed by your health care provider.  Do not take laxatives unless directed to do so by your health care provider.  Try a clear liquid diet (broth, tea, or water) as directed by your health care provider. Slowly move to a bland diet as tolerated. SEEK MEDICAL CARE IF:  You have unexplained abdominal pain.  You have abdominal pain associated with nausea or diarrhea.  You have pain when you urinate or have a bowel movement.  You experience abdominal pain that wakes you in the night.  You have abdominal pain that is worsened or improved by eating food.  You have abdominal pain that is worsened with eating fatty foods. SEEK IMMEDIATE MEDICAL CARE IF:   Your pain does not go away within 2 hours.  You have a fever.  You keep throwing up (vomiting).  Your pain is felt only in portions of the abdomen, such as the right side or the left lower portion of the abdomen.  You pass bloody or black tarry stools. MAKE SURE YOU:  Understand these instructions.   Will watch your condition.   Will get help right away if you are not doing well or get worse.  Document Released: 03/24/2005 Document Revised: 04/04/2013 Document Reviewed: 02/21/2013 [facility] Patient  Information 2014 [place]. Cirrhosis Cirrhosis is a condition of scarring of the liver which is caused when the liver has tried repairing itself following damage. This damage may come from a previous infection such as one of the forms of hepatitis (usually hepatitis C), or the damage may come from being injured by toxins. The main toxin that causes this damage is alcohol. The scarring of the liver from use of alcohol is irreversible. That means the liver cannot return to normal even though alcohol is not used any more. The main danger of hepatitis C infection is that it may cause long-lasting (chronic) liver disease, and this also may lead to cirrhosis. This complication is progressive and irreversible. CAUSES  Prior to available blood tests, hepatitis C could be contracted by blood transfusions. Since testing of blood has improved, this is now unlikely. This infection can also be contracted through intravenous drug use and the sharing of needles. It can also be contracted through sexual relationships. The injury caused by alcohol comes from too much use. It is not a few drinks that poison the liver, but years of misuse. Usually there will be some signs and symptoms early with scarring of the liver that suggest the development of better habits. Alcohol should never be used while using acetaminophen. A small dose of both taken together may cause irreversible damage to the liver. HOME CARE INSTRUCTIONS  There is no specific treatment for cirrhosis. However, there are things you can do to avoid making the condition worse.  Rest as  needed.  Eat a well-balanced diet. Your caregiver can help you with suggestions.  Vitamin supplements including vitamins A, K, D, and thiamine can help.  A low-salt diet, water restriction, or diuretic medicine may be needed to reduce fluid retention.  Avoid alcohol. This can be extremely toxic if combined with acetaminophen.  Avoid drugs which are toxic to the liver.  Some of these include isoniazid, methyldopa, acetaminophen, anabolic steroids (muscle-building drugs), erythromycin, and oral contraceptives (birth control pills). Check with your caregiver to make sure medicines you are presently taking will not be harmful.  Periodic blood tests may be required. Follow your caregiver's advice regarding the timing of these.  Milk thistle is an herbal remedy which does protect the liver against toxins. However, it will not help once the liver has been scarred. SEEK MEDICAL CARE IF:  You have increasing fatigue or weakness.  You develop swelling of the hands, feet, legs, or face.  You vomit bright red blood, or a coffee ground appearing material.  You have blood in your stools, or the stools turn black and tarry.  You have a fever.  You develop loss of appetite, or have nausea and vomiting.  You develop jaundice.  You develop easy bruising or bleeding.  You have worsening of any of the problems you are concerned about. Document Released: 06/14/2005 Document Revised: 09/06/2011 Document Reviewed: 01/31/2008 [facility] Patient Information 2014 [place].

## 2013-11-13 ENCOUNTER — Telehealth: Payer: Self-pay | Admitting: Internal Medicine

## 2013-11-13 NOTE — Telephone Encounter (Signed)
Needs OV.  

## 2013-11-13 NOTE — Telephone Encounter (Signed)
Pt called back and I told her that RMR was seeing patient and it could be a bit before he answered

## 2013-11-13 NOTE — Telephone Encounter (Signed)
Pt is coming to see AS at 1030. Pt's wife is aware and states that he is still in pain but she will not be going back to the ER because they do nothing for him.

## 2013-11-13 NOTE — Telephone Encounter (Signed)
Pt's wife called today to let us know that patient went to ER yesterday and has fluid swelling on his stomach and wanted RMR to review what was done while in the ER and any recommendations he may have. She asked for a return call to [phone]

## 2013-11-13 NOTE — Telephone Encounter (Signed)
Route to RMR 

## 2013-11-14 ENCOUNTER — Encounter: Payer: Self-pay | Admitting: Gastroenterology

## 2013-11-14 ENCOUNTER — Ambulatory Visit (INDEPENDENT_AMBULATORY_CARE_PROVIDER_SITE_OTHER): Payer: Medicare Other | Admitting: Gastroenterology

## 2013-11-14 ENCOUNTER — Other Ambulatory Visit: Payer: Self-pay | Admitting: Gastroenterology

## 2013-11-14 VITALS — BP 102/64 | HR 84 | Temp 97.3°F | Ht 68.0 in | Wt 158.2 lb

## 2013-11-14 DIAGNOSIS — R109 Unspecified abdominal pain: Secondary | ICD-10-CM

## 2013-11-14 DIAGNOSIS — R188 Other ascites: Secondary | ICD-10-CM

## 2013-11-14 DIAGNOSIS — Z86010 Personal history of colonic polyps: Secondary | ICD-10-CM | POA: Diagnosis not present

## 2013-11-14 DIAGNOSIS — Z860101 Personal history of adenomatous and serrated colon polyps: Secondary | ICD-10-CM

## 2013-11-14 NOTE — Assessment & Plan Note (Signed)
Due in 2018.
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
## 2013-11-14 NOTE — Assessment & Plan Note (Signed)
Question of tense ascites on exam. No lower extremity edema. Will order [place] of abdomen with possible paracentesis if enough fluid to safely drain. I have requested fluid analysis to include cytology, cell count, and cultures. No evidence of fever or other concerning signs. Consider low dose diuretics after Korea reviewed.

## 2013-11-14 NOTE — Assessment & Plan Note (Signed)
Chronic, feels it is worsening. Diagnosed with small bowel bacterial overgrowth at [facility]. Consider short course of abx if no evidence of ascites on [place] and persistent pain.

## 2013-11-14 NOTE — Patient Instructions (Signed)
We have scheduled you for an ultrasound with possible paracentesis.   I may need to start diuretic therapy, but I want to see what these results are first.   Please review the diet handout attached.   2 Gram Low Sodium Diet A 2 gram sodium diet restricts the amount of sodium in the diet to no more than 2 g or 2000 mg daily. Limiting the amount of sodium is often used to help lower blood pressure. It is important if you have heart, liver, or kidney problems. Many foods contain sodium for flavor and sometimes as a preservative. When the amount of sodium in a diet needs to be low, it is important to know what to look for when choosing foods and drinks. The following includes some information and guidelines to help make it easier for you to adapt to a low sodium diet. QUICK TIPS  Do not add salt to food.  Avoid convenience items and fast food.  Choose unsalted snack foods.  Buy lower sodium products, often labeled as "lower sodium" or "no salt added."  Check food labels to learn how much sodium is in 1 serving.  When eating at a restaurant, ask that your food be prepared with less salt or none, if possible. READING FOOD LABELS FOR SODIUM INFORMATION The nutrition facts label is a good place to find how much sodium is in foods. Look for products with no more than 500 to 600 mg of sodium per meal and no more than 150 mg per serving. Remember that 2 g = 2000 mg. The food label may also list foods as:  Sodium-free: Less than 5 mg in a serving.  Very low sodium: 35 mg or less in a serving.  Low-sodium: 140 mg or less in a serving.  Light in sodium: 50% less sodium in a serving. For example, if a food that usually has 300 mg of sodium is changed to become light in sodium, it will have 150 mg of sodium.  Reduced sodium: 25% less sodium in a serving. For example, if a food that usually has 400 mg of sodium is changed to reduced sodium, it will have 300 mg of sodium. CHOOSING  FOODS Grains  Avoid: Salted crackers and snack items. Some cereals, including instant hot cereals. Bread stuffing and biscuit mixes. Seasoned rice or pasta mixes.  Choose: Unsalted snack items. Low-sodium cereals, oats, puffed wheat and rice, shredded wheat. English muffins and bread. Pasta. Meats  Avoid: Salted, canned, smoked, spiced, pickled meats, including fish and poultry. Bacon, ham, sausage, cold cuts, hot dogs, anchovies.  Choose: Low-sodium canned tuna and salmon. Fresh or frozen meat, poultry, and fish. Dairy  Avoid: Processed cheese and spreads. Cottage cheese. Buttermilk and condensed milk. Regular cheese.  Choose: Milk. Low-sodium cottage cheese. Yogurt. Sour cream. Low-sodium cheese. Fruits and Vegetables  Avoid: Regular canned vegetables. Regular canned tomato sauce and paste. Frozen vegetables in sauces. Olives. Angie Fava. Relishes. Sauerkraut.  Choose: Low-sodium canned vegetables. Low-sodium tomato sauce and paste. Frozen or fresh vegetables. Fresh and frozen fruit. Condiments  Avoid: Canned and packaged gravies. Worcestershire sauce. Tartar sauce. Barbecue sauce. Soy sauce. Steak sauce. Ketchup. Onion, garlic, and table salt. Meat flavorings and tenderizers.  Choose: Fresh and dried herbs and spices. Low-sodium varieties of mustard and ketchup. Lemon juice. Tabasco sauce. Horseradish. SAMPLE 2 GRAM SODIUM MEAL PLAN Breakfast / Sodium (mg)  1 cup low-fat milk / 097 mg  2 slices whole-wheat toast / 270 mg  1 tbs heart-healthy margarine / 153  mg  1 hard-boiled egg / 139 mg  1 small orange / 0 mg Lunch / Sodium (mg)  1 cup raw carrots / 76 mg   cup hummus / 298 mg  1 cup low-fat milk / 143 mg   cup red grapes / 2 mg  1 whole-wheat pita bread / 356 mg Dinner / Sodium (mg)  1 cup whole-wheat pasta / 2 mg  1 cup low-sodium tomato sauce / 73 mg  3 oz lean ground beef / 57 mg  1 small side salad (1 cup raw spinach leaves,  cup cucumber,  cup  yellow bell pepper) with 1 tsp olive oil and 1 tsp red wine vinegar / 25 mg Snack / Sodium (mg)  1 container low-fat vanilla yogurt / 107 mg  3 graham cracker squares / 127 mg Nutrient Analysis  Calories: 2033  Protein: 77 g  Carbohydrate: 282 g  Fat: 72 g  Sodium: 1971 mg Document Released: 06/14/2005 Document Revised: 09/06/2011 Document Reviewed: 09/15/2009 ExitCare Patient Information 2014 [employer]. 2 Gram Low Sodium Diet A 2 gram sodium diet restricts the amount of sodium in the diet to no more than 2 g or 2000 mg daily. Limiting the amount of sodium is often used to help lower blood pressure. It is important if you have heart, liver, or kidney problems. Many foods contain sodium for flavor and sometimes as a preservative. When the amount of sodium in a diet needs to be low, it is important to know what to look for when choosing foods and drinks. The following includes some information and guidelines to help make it easier for you to adapt to a low sodium diet. QUICK TIPS  Do not add salt to food.  Avoid convenience items and fast food.  Choose unsalted snack foods.  Buy lower sodium products, often labeled as "lower sodium" or "no salt added."  Check food labels to learn how much sodium is in 1 serving.  When eating at a restaurant, ask that your food be prepared with less salt or none, if possible. READING FOOD LABELS FOR SODIUM INFORMATION The nutrition facts label is a good place to find how much sodium is in foods. Look for products with no more than 500 to 600 mg of sodium per meal and no more than 150 mg per serving. Remember that 2 g = 2000 mg. The food label may also list foods as:  Sodium-free: Less than 5 mg in a serving.  Very low sodium: 35 mg or less in a serving.  Low-sodium: 140 mg or less in a serving.  Light in sodium: 50% less sodium in a serving. For example, if a food that usually has 300 mg of sodium is changed to become light in sodium,  it will have 150 mg of sodium.  Reduced sodium: 25% less sodium in a serving. For example, if a food that usually has 400 mg of sodium is changed to reduced sodium, it will have 300 mg of sodium. CHOOSING FOODS Grains  Avoid: Salted crackers and snack items. Some cereals, including instant hot cereals. Bread stuffing and biscuit mixes. Seasoned rice or pasta mixes.  Choose: Unsalted snack items. Low-sodium cereals, oats, puffed wheat and rice, shredded wheat. English muffins and bread. Pasta. Meats  Avoid: Salted, canned, smoked, spiced, pickled meats, including fish and poultry. Bacon, ham, sausage, cold cuts, hot dogs, anchovies.  Choose: Low-sodium canned tuna and salmon. Fresh or frozen meat, poultry, and fish. Dairy  Avoid: Processed cheese and  spreads. Cottage cheese. Buttermilk and condensed milk. Regular cheese.  Choose: Milk. Low-sodium cottage cheese. Yogurt. Sour cream. Low-sodium cheese. Fruits and Vegetables  Avoid: Regular canned vegetables. Regular canned tomato sauce and paste. Frozen vegetables in sauces. Olives. Angie Fava. Relishes. Sauerkraut.  Choose: Low-sodium canned vegetables. Low-sodium tomato sauce and paste. Frozen or fresh vegetables. Fresh and frozen fruit. Condiments  Avoid: Canned and packaged gravies. Worcestershire sauce. Tartar sauce. Barbecue sauce. Soy sauce. Steak sauce. Ketchup. Onion, garlic, and table salt. Meat flavorings and tenderizers.  Choose: Fresh and dried herbs and spices. Low-sodium varieties of mustard and ketchup. Lemon juice. Tabasco sauce. Horseradish. SAMPLE 2 GRAM SODIUM MEAL PLAN Breakfast / Sodium (mg)  1 cup low-fat milk / 628 mg  2 slices whole-wheat toast / 270 mg  1 tbs heart-healthy margarine / 153 mg  1 hard-boiled egg / 139 mg  1 small orange / 0 mg Lunch / Sodium (mg)  1 cup raw carrots / 76 mg   cup hummus / 298 mg  1 cup low-fat milk / 143 mg   cup red grapes / 2 mg  1 whole-wheat pita bread / 356  mg Dinner / Sodium (mg)  1 cup whole-wheat pasta / 2 mg  1 cup low-sodium tomato sauce / 73 mg  3 oz lean ground beef / 57 mg  1 small side salad (1 cup raw spinach leaves,  cup cucumber,  cup yellow bell pepper) with 1 tsp olive oil and 1 tsp red wine vinegar / 25 mg Snack / Sodium (mg)  1 container low-fat vanilla yogurt / 107 mg  3 graham cracker squares / 127 mg Nutrient Analysis  Calories: 2033  Protein: 77 g  Carbohydrate: 282 g  Fat: 72 g  Sodium: 1971 mg Document Released: 06/14/2005 Document Revised: 09/06/2011 Document Reviewed: 09/15/2009 ExitCare Patient Information 2014 [place].

## 2013-11-15 NOTE — Telephone Encounter (Signed)
Pt was seen in office Wednesday

## 2013-11-16 ENCOUNTER — Ambulatory Visit (HOSPITAL_COMMUNITY)
Admission: RE | Admit: 2013-11-16 | Discharge: 2013-11-16 | Disposition: A | Discharge status: Home / Self Care | Payer: Medicare Other | Source: Ambulatory Visit | Attending: Gastroenterology | Admitting: Gastroenterology

## 2013-11-16 ENCOUNTER — Other Ambulatory Visit: Payer: Self-pay | Admitting: Gastroenterology

## 2013-11-16 DIAGNOSIS — R188 Other ascites: Secondary | ICD-10-CM | POA: Diagnosis not present

## 2013-11-16 DIAGNOSIS — K7460 Unspecified cirrhosis of liver: Secondary | ICD-10-CM | POA: Insufficient documentation

## 2013-11-20 ENCOUNTER — Telehealth: Payer: Self-pay

## 2013-11-20 MED ORDER — METRONIDAZOLE 500 MG PO TABS: 500.0000 mg | ORAL_TABLET | Freq: Three times a day (TID) | ORAL | Status: DC

## 2013-11-20 NOTE — Progress Notes (Signed)
[place] of abdomen without evidence of ascites. Patient has been treated for bacterial overgrowth by [facility] with Flagyl and responded well. Will treat empirically with Flagyl X 7 days. I have sent this to his pharmacy. Needs to start taking a probiotic daily.

## 2013-11-20 NOTE — Telephone Encounter (Signed)
Pt' wife is aware of Rx at the drug store.

## 2013-11-20 NOTE — Telephone Encounter (Signed)
[place] of abdomen without evidence of ascites. Patient has been treated for bacterial overgrowth by [facility] with Flagyl and responded well in the past. Will treat empirically with Flagyl X 7 days. I have sent this to his pharmacy. Needs to start taking a probiotic daily.

## 2013-11-20 NOTE — Telephone Encounter (Signed)
Will discuss with extender who just saw the patient last week

## 2013-11-20 NOTE — Telephone Encounter (Signed)
Pt's wife called this morning crying and worried about her husband. His stomach is still tight and they was not able to drawn any fluid off from the [place]. He is having to take more pain medication then he likes to take. She is very worried and does not know what to do. Please advise  Call back number is [phone].

## 2013-11-20 NOTE — Addendum Note (Signed)
Addended by: Orvil Feil on: 11/20/2013 02:06 PM   Modules accepted: Orders

## 2013-11-21 NOTE — Progress Notes (Signed)
cc'd to pcp 

## 2013-11-27 DIAGNOSIS — K6389 Other specified diseases of intestine: Secondary | ICD-10-CM | POA: Diagnosis not present

## 2013-12-10 DIAGNOSIS — IMO0002 Reserved for concepts with insufficient information to code with codable children: Secondary | ICD-10-CM | POA: Diagnosis not present

## 2013-12-10 DIAGNOSIS — R1011 Right upper quadrant pain: Secondary | ICD-10-CM | POA: Diagnosis not present

## 2013-12-10 DIAGNOSIS — G894 Chronic pain syndrome: Secondary | ICD-10-CM | POA: Diagnosis not present

## 2013-12-13 ENCOUNTER — Telehealth: Payer: Self-pay | Admitting: *Deleted

## 2013-12-13 NOTE — Telephone Encounter (Signed)
Pt's wife called stating pt is in a lot of pain with his abdomen he has a knot in it 6" from top to bottom 3 quarters inches wide and if pt stands up he looks like alien coming out of his stomach. Pt's wife states they have seen Dr. Blanca Friend at [facility] but can not remember who referred him to there, wife states that Dr. Blanca Friend is just now getting his reports from [facility] and it has been 2 weeks for that and that office told her they would call her, pt's wife wants to know what they need to do because of how much pain the pt is in, pt's wife wants to know do they need to wait on [facility] or come see Dr. [name]. Per Wife pt has been told he could have a weak abd wall muscle, pt has lost 5lbs because it hurts him to eat. Wife said that pt has been having BMs normal. Please advise [phone]

## 2013-12-14 NOTE — Telephone Encounter (Signed)
Last ct was done at [facility] on 11/12/13

## 2013-12-14 NOTE — Telephone Encounter (Signed)
Spoke with pts wife this morning-  Pt has a "bulge" in his abdomen, she describes it as an alien coming out of his stomach, it is 6 inches long and 3 inches in diameter. This area only bulges out when pt bends or when he gets up out of a chair, only when he uses his stomach muscles. Any other time she cannot see this area bulging. She said it hurts most of the time but hurts worse when he eats. He does have nausea but no vomiting. She gives him 59ml of lactulose in the AM and he will have 3 loose stools, she said they were soft but a little watery. At night she gives him 9ml of lactulose and he will get up in the middle of the night to have another bm. No blood in his stool. He is coherent.  Dr. [name], do you have any recommendations for the pt?

## 2013-12-14 NOTE — Telephone Encounter (Signed)
pts wife is aware. [facility] records are on RMR cart. She is aware that I will get back with her after RMR returns.

## 2013-12-14 NOTE — Telephone Encounter (Signed)
Late entry- checked [facility] records yesterday afternoon. Nurse from [facility] contacted pt yesterday morning and told them that he was full of stool and to do a miralax cleanse.

## 2013-12-14 NOTE — Telephone Encounter (Signed)
I reviewed his CT done 10/2013. Reviewed records from [facility].  Patient has chronic abdominal pain and see pain management doctor at [facility] the bulge he is seeing is due to diastasis recti. There was no hernia reported on the CT or previous MRI. Based on wife's description it sounds like he has abdominal wall weakness due to separate of the abdominal muscle in the midline.   His CT did show right colon with significant stool. It would not hurt to do a small miralax cleanse to see if this helps but again this CT was from several weeks ago. He could hold lactulose the day of the miralax cleanse (take one capful every hour X 4-5 hours or until gets good results).   Other option would be plain abdominal film now to reassess stool load but really don't think that is necessary.  SAVE FOR RMR INPUT UPON HIS RETURN NEXT WEEK.

## 2013-12-14 NOTE — Telephone Encounter (Signed)
Routing to [facility] to review in RMR absence.

## 2013-12-15 NOTE — Telephone Encounter (Signed)
agree with advice given; lets call and see how he's doing the first of the week

## 2013-12-18 ENCOUNTER — Telehealth: Payer: Self-pay | Admitting: *Deleted

## 2013-12-18 NOTE — Telephone Encounter (Signed)
Pt has a appointment in a urgent spot for tomorrow 12/18/13  with Laban Emperor NP at 11:30, and pt is aware.

## 2013-12-18 NOTE — Telephone Encounter (Signed)
Multiple telephone contacts between this office and patient and between [facility] and patient. I reviewed the [facility] records.  I'm not sure whether or not patient needs a more aggressive bowel regimen (i.e. additional Linzess, etc) or something else. It would be best that the patient come to the office for urgent visit in the next 24-48 hours.

## 2013-12-18 NOTE — Telephone Encounter (Signed)
(  see other phone notes) Pt's wife called stating she spoke with Almyra Free and she gave pt a cup full of miralax yesterday she gave him 1 an hour for 5 hours, pt said it is helping very little, pt still has a big lump on his stomach, pt started the miralax yesterday, pt has been in bed for the last 3 days and is having pains. Pt's wife wants to speak with someone ASAP about pt and figure out what they need to do. Please advise [phone].

## 2013-12-18 NOTE — Telephone Encounter (Signed)
Pt has ov tomorrow.  

## 2013-12-19 ENCOUNTER — Other Ambulatory Visit: Payer: Self-pay | Admitting: Internal Medicine

## 2013-12-19 ENCOUNTER — Ambulatory Visit: Payer: Medicare Other | Admitting: Gastroenterology

## 2013-12-19 ENCOUNTER — Encounter: Payer: Self-pay | Admitting: Gastroenterology

## 2013-12-19 ENCOUNTER — Encounter (INDEPENDENT_AMBULATORY_CARE_PROVIDER_SITE_OTHER): Payer: Self-pay

## 2013-12-19 ENCOUNTER — Ambulatory Visit (INDEPENDENT_AMBULATORY_CARE_PROVIDER_SITE_OTHER): Payer: Medicare Other | Admitting: Gastroenterology

## 2013-12-19 ENCOUNTER — Other Ambulatory Visit: Payer: Self-pay | Admitting: Gastroenterology

## 2013-12-19 VITALS — BP 111/73 | HR 82 | Temp 98.2°F | Resp 20 | Ht 69.0 in | Wt 153.0 lb

## 2013-12-19 DIAGNOSIS — K7689 Other specified diseases of liver: Secondary | ICD-10-CM

## 2013-12-19 DIAGNOSIS — K862 Cyst of pancreas: Secondary | ICD-10-CM

## 2013-12-19 DIAGNOSIS — R109 Unspecified abdominal pain: Secondary | ICD-10-CM | POA: Diagnosis not present

## 2013-12-19 DIAGNOSIS — K7581 Nonalcoholic steatohepatitis (NASH): Secondary | ICD-10-CM

## 2013-12-19 DIAGNOSIS — K7460 Unspecified cirrhosis of liver: Secondary | ICD-10-CM | POA: Diagnosis not present

## 2013-12-19 DIAGNOSIS — R1310 Dysphagia, unspecified: Secondary | ICD-10-CM | POA: Diagnosis not present

## 2013-12-19 DIAGNOSIS — Z8719 Personal history of other diseases of the digestive system: Secondary | ICD-10-CM | POA: Diagnosis not present

## 2013-12-19 DIAGNOSIS — R1319 Other dysphagia: Secondary | ICD-10-CM

## 2013-12-19 LAB — CREATININE, SERUM: CREATININE: 1.1 mg/dL (ref 0.50–1.35)

## 2013-12-19 MED ORDER — METRONIDAZOLE 500 MG PO TABS: 500.0000 mg | ORAL_TABLET | Freq: Two times a day (BID) | ORAL | Status: DC

## 2013-12-19 NOTE — Assessment & Plan Note (Addendum)
MRI of liver for [place] screening. Screening EGD performed in 2012 as noted in [facility]. Remains on non-selective beta-blocker. EGD now for diagnostic purposes secondary to abdominal pain, dysphagia, nausea.

## 2013-12-19 NOTE — Progress Notes (Signed)
cc'd to pcp 

## 2013-12-19 NOTE — Assessment & Plan Note (Addendum)
68 year old male with history of NASH cirrhosis and chronic abdominal pain, presenting with persistent upper abdominal pain with eating, nausea, with last EGD in 2012. Multiple imaging studies on file without source for pain; known chronic abdominal pain playing a role, with management deferred to pain clinic at [facility]. However, with his persistent symptoms, discussed repeating upper GI evaluation to assess for gastritis, occult PUD. Vague pill dysphagia; question underlying motility disorder but will proceed with dilation as appropriate.   1. Proceed with upper endoscopy and dilation in the near future with Dr. [name]. The risks, benefits, and alternatives have been discussed in detail with patient. They have stated understanding and desire to proceed. PHENERGAN 12.5 mg IV ON CALL 2. BPE if persistent dysphagia 3. Continue Prilosec 40 mg BID 4. MRI of liver due to history of NASH cirrhosis 5. Reassured patient there is no CT evidence of hernia nor clinical evidence; has rectus diastasis. Both wife and husband more at ease regarding this.  6.Also discussed with GI clinic at [facility] regarding bacterial overgrowth treatment. Recommended 1 week per month of alternating abx regimens. Will give an additional 7 days of Flagyl 500 mg po BID. Consider alternating with doxycycline 100 mg BID X 7 days. Discussed this in depth with Brad Wall, [place] [phone]).

## 2013-12-19 NOTE — Addendum Note (Signed)
Addended by: Orvil Feil on: 12/19/2013 01:54 PM   Modules accepted: Orders
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
## 2013-12-19 NOTE — Assessment & Plan Note (Signed)
Question underlying motility disorder; notes pill dysphagia. Could have occult web, ring, stricture. Dilation at time of EGD as appropriate.

## 2013-12-19 NOTE — Patient Instructions (Signed)
We have scheduled you for the routine MRI to assess your liver.   I have ordered an upper endoscopy with dilation if needed with Dr. [name] in the near future.   I will be in touch with Nevin Bloodgood at [facility] regarding future treatments for bacterial overgrowth so that we are all on the same page.

## 2013-12-31 ENCOUNTER — Encounter (HOSPITAL_COMMUNITY): Payer: Self-pay | Admitting: Pharmacy Technician

## 2014-01-01 ENCOUNTER — Ambulatory Visit
Admission: RE | Admit: 2014-01-01 | Discharge: 2014-01-01 | Disposition: A | Discharge status: Home / Self Care | Payer: Medicare Other | Source: Ambulatory Visit | Attending: Gastroenterology | Admitting: Gastroenterology

## 2014-01-01 DIAGNOSIS — K7460 Unspecified cirrhosis of liver: Secondary | ICD-10-CM | POA: Diagnosis not present

## 2014-01-01 DIAGNOSIS — K766 Portal hypertension: Secondary | ICD-10-CM | POA: Diagnosis not present

## 2014-01-01 DIAGNOSIS — Z8719 Personal history of other diseases of the digestive system: Secondary | ICD-10-CM

## 2014-01-01 DIAGNOSIS — K862 Cyst of pancreas: Secondary | ICD-10-CM

## 2014-01-01 MED ORDER — GADOXETATE DISODIUM 0.25 MMOL/ML IV SOLN
7.0000 mL | Freq: Once | INTRAVENOUS | Status: AC | PRN
  Administered 2014-01-01: 7 mL via INTRAVENOUS

## 2014-01-02 DIAGNOSIS — K2100 Gastro-esophageal reflux disease with esophagitis, without bleeding: Secondary | ICD-10-CM | POA: Diagnosis not present

## 2014-01-02 DIAGNOSIS — M545 Low back pain, unspecified: Secondary | ICD-10-CM | POA: Diagnosis not present

## 2014-01-02 DIAGNOSIS — I10 Essential (primary) hypertension: Secondary | ICD-10-CM | POA: Diagnosis not present

## 2014-01-02 DIAGNOSIS — E1149 Type 2 diabetes mellitus with other diabetic neurological complication: Secondary | ICD-10-CM | POA: Diagnosis not present

## 2014-01-09 DIAGNOSIS — G894 Chronic pain syndrome: Secondary | ICD-10-CM | POA: Diagnosis not present

## 2014-01-09 DIAGNOSIS — R1011 Right upper quadrant pain: Secondary | ICD-10-CM | POA: Diagnosis not present

## 2014-01-11 ENCOUNTER — Encounter (HOSPITAL_COMMUNITY): Payer: Medicare Other | Attending: Hematology and Oncology

## 2014-01-11 DIAGNOSIS — E7800 Pure hypercholesterolemia, unspecified: Secondary | ICD-10-CM | POA: Diagnosis not present

## 2014-01-11 DIAGNOSIS — E119 Type 2 diabetes mellitus without complications: Secondary | ICD-10-CM | POA: Diagnosis not present

## 2014-01-11 DIAGNOSIS — F3289 Other specified depressive episodes: Secondary | ICD-10-CM | POA: Insufficient documentation

## 2014-01-11 DIAGNOSIS — D509 Iron deficiency anemia, unspecified: Secondary | ICD-10-CM | POA: Insufficient documentation

## 2014-01-11 DIAGNOSIS — G8929 Other chronic pain: Secondary | ICD-10-CM | POA: Diagnosis not present

## 2014-01-11 DIAGNOSIS — D6959 Other secondary thrombocytopenia: Secondary | ICD-10-CM | POA: Insufficient documentation

## 2014-01-11 DIAGNOSIS — F329 Major depressive disorder, single episode, unspecified: Secondary | ICD-10-CM | POA: Diagnosis not present

## 2014-01-11 DIAGNOSIS — Z8673 Personal history of transient ischemic attack (TIA), and cerebral infarction without residual deficits: Secondary | ICD-10-CM | POA: Insufficient documentation

## 2014-01-11 DIAGNOSIS — Z79899 Other long term (current) drug therapy: Secondary | ICD-10-CM | POA: Insufficient documentation

## 2014-01-11 DIAGNOSIS — D696 Thrombocytopenia, unspecified: Secondary | ICD-10-CM

## 2014-01-11 DIAGNOSIS — K7460 Unspecified cirrhosis of liver: Secondary | ICD-10-CM | POA: Diagnosis not present

## 2014-01-11 DIAGNOSIS — M479 Spondylosis, unspecified: Secondary | ICD-10-CM | POA: Diagnosis not present

## 2014-01-11 DIAGNOSIS — R161 Splenomegaly, not elsewhere classified: Secondary | ICD-10-CM | POA: Diagnosis not present

## 2014-01-11 DIAGNOSIS — R911 Solitary pulmonary nodule: Secondary | ICD-10-CM | POA: Diagnosis not present

## 2014-01-11 DIAGNOSIS — G959 Disease of spinal cord, unspecified: Secondary | ICD-10-CM | POA: Insufficient documentation

## 2014-01-11 LAB — CBC WITH DIFFERENTIAL/PLATELET
BASOS PCT: 1 % (ref 0–1)
Basophils Absolute: 0 10*3/uL (ref 0.0–0.1)
EOS PCT: 3 % (ref 0–5)
Eosinophils Absolute: 0.1 10*3/uL (ref 0.0–0.7)
HEMATOCRIT: 39.5 % (ref 39.0–52.0)
HEMOGLOBIN: 13.1 g/dL (ref 13.0–17.0)
Lymphocytes Relative: 22 % (ref 12–46)
Lymphs Abs: 0.8 10*3/uL (ref 0.7–4.0)
MCH: 32.6 pg (ref 26.0–34.0)
MCHC: 33.2 g/dL (ref 30.0–36.0)
MCV: 98.3 fL (ref 78.0–100.0)
MONO ABS: 0.2 10*3/uL (ref 0.1–1.0)
MONOS PCT: 6 % (ref 3–12)
NEUTROS ABS: 2.5 10*3/uL (ref 1.7–7.7)
Neutrophils Relative %: 69 % (ref 43–77)
Platelets: 119 10*3/uL — ABNORMAL LOW (ref 150–400)
RBC: 4.02 MIL/uL — ABNORMAL LOW (ref 4.22–5.81)
RDW: 13.7 % (ref 11.5–15.5)
WBC: 3.7 10*3/uL — ABNORMAL LOW (ref 4.0–10.5)

## 2014-01-11 LAB — COMPREHENSIVE METABOLIC PANEL
ALT: 30 U/L (ref 0–53)
ANION GAP: 13 (ref 5–15)
AST: 39 U/L — ABNORMAL HIGH (ref 0–37)
Albumin: 4.6 g/dL (ref 3.5–5.2)
Alkaline Phosphatase: 179 U/L — ABNORMAL HIGH (ref 39–117)
BUN: 14 mg/dL (ref 6–23)
CALCIUM: 9.8 mg/dL (ref 8.4–10.5)
CHLORIDE: 101 meq/L (ref 96–112)
CO2: 23 mEq/L (ref 19–32)
CREATININE: 1.11 mg/dL (ref 0.50–1.35)
GFR calc Af Amer: 79 mL/min — ABNORMAL LOW (ref >90)
GFR, EST NON AFRICAN AMERICAN: 68 mL/min — AB (ref >90)
Glucose, Bld: 185 mg/dL — ABNORMAL HIGH (ref 70–99)
Potassium: 4.3 mEq/L (ref 3.7–5.3)
Sodium: 137 mEq/L (ref 137–147)
Total Bilirubin: 0.4 mg/dL (ref 0.3–1.2)
Total Protein: 8.1 g/dL (ref 6.0–8.3)

## 2014-01-11 LAB — FERRITIN: Ferritin: 54 ng/mL (ref 22–322)

## 2014-01-11 LAB — IRON AND TIBC
IRON: 101 ug/dL (ref 42–135)
Saturation Ratios: 24 % (ref 20–55)
TIBC: 413 ug/dL (ref 215–435)
UIBC: 312 ug/dL (ref 125–400)

## 2014-01-11 NOTE — Progress Notes (Signed)
LABS DRAWN FOR CBCD,CMP,IRON/TIBC,FERR,AFPTM

## 2014-01-12 ENCOUNTER — Other Ambulatory Visit (HOSPITAL_COMMUNITY): Payer: Self-pay | Admitting: Oncology

## 2014-01-12 DIAGNOSIS — D509 Iron deficiency anemia, unspecified: Secondary | ICD-10-CM

## 2014-01-12 DIAGNOSIS — D500 Iron deficiency anemia secondary to blood loss (chronic): Secondary | ICD-10-CM

## 2014-01-12 DIAGNOSIS — K7460 Unspecified cirrhosis of liver: Secondary | ICD-10-CM

## 2014-01-12 DIAGNOSIS — K7581 Nonalcoholic steatohepatitis (NASH): Secondary | ICD-10-CM

## 2014-01-12 LAB — AFP TUMOR MARKER: AFP-Tumor Marker: 7.4 ng/mL (ref 0.0–8.0)
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
## 2014-01-14 ENCOUNTER — Ambulatory Visit (HOSPITAL_COMMUNITY)
Admission: RE | Admit: 2014-01-14 | Discharge: 2014-01-14 | Disposition: A | Discharge status: Home / Self Care | Payer: Medicare Other | Source: Ambulatory Visit | Attending: Oncology | Admitting: Oncology

## 2014-01-14 ENCOUNTER — Telehealth: Payer: Self-pay | Admitting: Internal Medicine

## 2014-01-14 DIAGNOSIS — R161 Splenomegaly, not elsewhere classified: Secondary | ICD-10-CM | POA: Diagnosis not present

## 2014-01-14 DIAGNOSIS — R911 Solitary pulmonary nodule: Secondary | ICD-10-CM

## 2014-01-14 DIAGNOSIS — K7460 Unspecified cirrhosis of liver: Secondary | ICD-10-CM | POA: Diagnosis not present

## 2014-01-14 DIAGNOSIS — Z09 Encounter for follow-up examination after completed treatment for conditions other than malignant neoplasm: Secondary | ICD-10-CM | POA: Insufficient documentation

## 2014-01-14 DIAGNOSIS — K7689 Other specified diseases of liver: Secondary | ICD-10-CM | POA: Diagnosis not present

## 2014-01-14 NOTE — Telephone Encounter (Signed)
MRI: Stigmata of cirrhosis with portal hypertension, including  splenomegaly, redemonstrated. No evidence of developing  hepatocellular carcinoma at this time.  2. Stable 5 mm lesion in the body of the pancreas which is unchanged  in retrospect compared to prior study 05/29/2012, strongly favored  to represent a benign lesion such as a small pancreatic pseudocyst.  No communication with the main pancreatic duct, and no main  pancreatic ductal dilatation to indicate an intraductal papillary  mucinous neoplasm (IPMN) at this time.   No evidence of HCC. Repeat in 6 months.

## 2014-01-14 NOTE — Telephone Encounter (Signed)
pts wife is aware. She also asked about his prep for his EGD on Wed. We went over the instructions, clear liquids from 6pm till Midnight and NPO after midnight and he can take any blood pressure/breathing medications with a small sip of water on the morning of the procedure. He will hold his other medications until after the procedure.

## 2014-01-14 NOTE — Telephone Encounter (Signed)
Patients wife is calling regarding MRI results on [place], please advise

## 2014-01-14 NOTE — Telephone Encounter (Signed)
6 month repeat MRI nicd

## 2014-01-14 NOTE — Telephone Encounter (Signed)
Routing to AS 

## 2014-01-15 ENCOUNTER — Encounter (HOSPITAL_BASED_OUTPATIENT_CLINIC_OR_DEPARTMENT_OTHER): Payer: Medicare Other

## 2014-01-15 ENCOUNTER — Encounter (HOSPITAL_BASED_OUTPATIENT_CLINIC_OR_DEPARTMENT_OTHER): Payer: Medicare Other | Admitting: Oncology

## 2014-01-15 ENCOUNTER — Encounter (HOSPITAL_COMMUNITY): Payer: Self-pay | Admitting: Oncology

## 2014-01-15 VITALS — BP 103/69 | HR 77 | Temp 98.2°F | Resp 18 | Wt 152.7 lb

## 2014-01-15 VITALS — BP 92/61 | HR 86 | Temp 98.2°F | Resp 18

## 2014-01-15 DIAGNOSIS — D696 Thrombocytopenia, unspecified: Secondary | ICD-10-CM

## 2014-01-15 DIAGNOSIS — K7581 Nonalcoholic steatohepatitis (NASH): Secondary | ICD-10-CM

## 2014-01-15 DIAGNOSIS — D509 Iron deficiency anemia, unspecified: Secondary | ICD-10-CM | POA: Diagnosis not present

## 2014-01-15 DIAGNOSIS — D500 Iron deficiency anemia secondary to blood loss (chronic): Secondary | ICD-10-CM

## 2014-01-15 DIAGNOSIS — K7689 Other specified diseases of liver: Secondary | ICD-10-CM | POA: Diagnosis not present

## 2014-01-15 DIAGNOSIS — K7460 Unspecified cirrhosis of liver: Secondary | ICD-10-CM

## 2014-01-15 MED ORDER — SODIUM CHLORIDE 0.9 % IV SOLN
INTRAVENOUS | Status: DC
  Administered 2014-01-15: 12:00:00 via INTRAVENOUS

## 2014-01-15 MED ORDER — SODIUM CHLORIDE 0.9 % IJ SOLN
10.0000 mL | Freq: Once | INTRAMUSCULAR | Status: AC
  Administered 2014-01-15: 10 mL via INTRAVENOUS

## 2014-01-15 MED ORDER — SODIUM CHLORIDE 0.9 % IV SOLN
510.0000 mg | Freq: Once | INTRAVENOUS | Status: AC
  Administered 2014-01-15: 510 mg via INTRAVENOUS
  Filled 2014-01-15: qty 17

## 2014-01-15 NOTE — Progress Notes (Signed)
Quick Note:  MRI without evidence of HCC> Repeat in 6 months. Stable area in pancreas likely benign. ______

## 2014-01-15 NOTE — Progress Notes (Signed)
Tolerated infusion well.  No s/s of reaction.  Alert, in no apparent distress.  Left in c/o spouse for transport home.

## 2014-01-15 NOTE — Patient Instructions (Signed)
[facility] Discharge Instructions  RECOMMENDATIONS MADE BY THE CONSULTANT AND ANY TEST RESULTS WILL BE SENT TO YOUR REFERRING PHYSICIAN.  EXAM FINDINGS BY THE PHYSICIAN TODAY AND SIGNS OR SYMPTOMS TO REPORT TO CLINIC OR PRIMARY PHYSICIAN: You saw Brad Wall today  Follow up in 4 months with the doctor and lab work.  You will have an iron transfusion today and in 1 week.    Thank you for choosing [facility] to provide your oncology and hematology care.  To afford each patient quality time with our providers, please arrive at least 15 minutes before your scheduled appointment time.  With your help, our goal is to use those 15 minutes to complete the necessary work-up to ensure our physicians have the information they need to help with your evaluation and healthcare recommendations.    Effective January 1st, 2014, we ask that you re-schedule your appointment with our physicians should you arrive 10 or more minutes late for your appointment.  We strive to give you quality time with our providers, and arriving late affects you and other patients whose appointments are after yours.    Again, thank you for choosing [facility].  Our hope is that these requests will decrease the amount of time that you wait before being seen by our physicians.       _____________________________________________________________  Should you have questions after your visit to [facility], please contact our office at [phone] between the hours of 8:30 a.m. and 5:00 p.m.  Voicemails left after 4:30 p.m. will not be returned until the following business day.  For prescription refill requests, have your pharmacy contact our office with your prescription refill request.

## 2014-01-16 ENCOUNTER — Ambulatory Visit (HOSPITAL_COMMUNITY)
Admission: RE | Admit: 2014-01-16 | Discharge: 2014-01-16 | Disposition: A | Discharge status: Home / Self Care | Payer: Medicare Other | Source: Ambulatory Visit | Attending: Internal Medicine | Admitting: Internal Medicine

## 2014-01-16 ENCOUNTER — Encounter (HOSPITAL_COMMUNITY): Payer: Self-pay | Admitting: *Deleted

## 2014-01-16 ENCOUNTER — Encounter (HOSPITAL_COMMUNITY)
Admission: RE | Disposition: A | Discharge status: Home / Self Care | Payer: Self-pay | Source: Ambulatory Visit | Attending: Internal Medicine

## 2014-01-16 DIAGNOSIS — F3289 Other specified depressive episodes: Secondary | ICD-10-CM | POA: Insufficient documentation

## 2014-01-16 DIAGNOSIS — Z7982 Long term (current) use of aspirin: Secondary | ICD-10-CM | POA: Insufficient documentation

## 2014-01-16 DIAGNOSIS — K7581 Nonalcoholic steatohepatitis (NASH): Secondary | ICD-10-CM

## 2014-01-16 DIAGNOSIS — K7689 Other specified diseases of liver: Secondary | ICD-10-CM | POA: Diagnosis not present

## 2014-01-16 DIAGNOSIS — E119 Type 2 diabetes mellitus without complications: Secondary | ICD-10-CM | POA: Insufficient documentation

## 2014-01-16 DIAGNOSIS — I10 Essential (primary) hypertension: Secondary | ICD-10-CM | POA: Insufficient documentation

## 2014-01-16 DIAGNOSIS — R1310 Dysphagia, unspecified: Secondary | ICD-10-CM | POA: Insufficient documentation

## 2014-01-16 DIAGNOSIS — Z794 Long term (current) use of insulin: Secondary | ICD-10-CM | POA: Insufficient documentation

## 2014-01-16 DIAGNOSIS — Z8042 Family history of malignant neoplasm of prostate: Secondary | ICD-10-CM | POA: Insufficient documentation

## 2014-01-16 DIAGNOSIS — Z800 Family history of malignant neoplasm of digestive organs: Secondary | ICD-10-CM | POA: Diagnosis not present

## 2014-01-16 DIAGNOSIS — I8510 Secondary esophageal varices without bleeding: Secondary | ICD-10-CM | POA: Insufficient documentation

## 2014-01-16 DIAGNOSIS — F329 Major depressive disorder, single episode, unspecified: Secondary | ICD-10-CM | POA: Insufficient documentation

## 2014-01-16 DIAGNOSIS — Z79899 Other long term (current) drug therapy: Secondary | ICD-10-CM | POA: Diagnosis not present

## 2014-01-16 DIAGNOSIS — K7460 Unspecified cirrhosis of liver: Secondary | ICD-10-CM | POA: Diagnosis not present

## 2014-01-16 DIAGNOSIS — F411 Generalized anxiety disorder: Secondary | ICD-10-CM | POA: Diagnosis not present

## 2014-01-16 DIAGNOSIS — Z87891 Personal history of nicotine dependence: Secondary | ICD-10-CM | POA: Insufficient documentation

## 2014-01-16 DIAGNOSIS — R109 Unspecified abdominal pain: Secondary | ICD-10-CM | POA: Insufficient documentation

## 2014-01-16 DIAGNOSIS — Z801 Family history of malignant neoplasm of trachea, bronchus and lung: Secondary | ICD-10-CM | POA: Insufficient documentation

## 2014-01-16 DIAGNOSIS — K319 Disease of stomach and duodenum, unspecified: Secondary | ICD-10-CM | POA: Diagnosis not present

## 2014-01-16 DIAGNOSIS — R1319 Other dysphagia: Secondary | ICD-10-CM

## 2014-01-16 HISTORY — PX: BIOPSY: SHX5522

## 2014-01-16 HISTORY — PX: ESOPHAGOGASTRODUODENOSCOPY: SHX5428

## 2014-01-16 HISTORY — PX: MALONEY DILATION: SHX5535

## 2014-01-16 LAB — KOH PREP

## 2014-01-16 LAB — GLUCOSE, CAPILLARY: Glucose-Capillary: 107 mg/dL — ABNORMAL HIGH (ref 70–99)

## 2014-01-16 SURGERY — EGD (ESOPHAGOGASTRODUODENOSCOPY)
Anesthesia: Moderate Sedation

## 2014-01-16 MED ORDER — ONDANSETRON HCL 4 MG/2ML IJ SOLN
INTRAMUSCULAR | Status: AC
  Filled 2014-01-16: qty 2

## 2014-01-16 MED ORDER — MEPERIDINE HCL 100 MG/ML IJ SOLN
INTRAMUSCULAR | Status: DC | PRN
  Administered 2014-01-16 (×2): 25 mg via INTRAVENOUS

## 2014-01-16 MED ORDER — MEPERIDINE HCL 100 MG/ML IJ SOLN
INTRAMUSCULAR | Status: AC
  Filled 2014-01-16: qty 2

## 2014-01-16 MED ORDER — PROMETHAZINE HCL 25 MG/ML IJ SOLN
12.5000 mg | Freq: Once | INTRAMUSCULAR | Status: AC
  Administered 2014-01-16: 12.5 mg via INTRAVENOUS

## 2014-01-16 MED ORDER — PROMETHAZINE HCL 25 MG/ML IJ SOLN
INTRAMUSCULAR | Status: AC
  Filled 2014-01-16: qty 1

## 2014-01-16 MED ORDER — ONDANSETRON HCL 4 MG/2ML IJ SOLN
INTRAMUSCULAR | Status: DC | PRN
  Administered 2014-01-16: 4 mg via INTRAVENOUS

## 2014-01-16 MED ORDER — LIDOCAINE VISCOUS 2 % MT SOLN
OROMUCOSAL | Status: DC | PRN
  Administered 2014-01-16: 3 mL via OROMUCOSAL

## 2014-01-16 MED ORDER — MIDAZOLAM HCL 5 MG/5ML IJ SOLN
INTRAMUSCULAR | Status: AC
  Filled 2014-01-16: qty 10

## 2014-01-16 MED ORDER — SODIUM CHLORIDE 0.9 % IV SOLN
INTRAVENOUS | Status: DC
  Administered 2014-01-16: 08:00:00 via INTRAVENOUS

## 2014-01-16 MED ORDER — SIMETHICONE 40 MG/0.6ML PO SUSP
ORAL | Status: DC | PRN
  Administered 2014-01-16: 09:00:00

## 2014-01-16 MED ORDER — SODIUM CHLORIDE 0.9 % IJ SOLN
INTRAMUSCULAR | Status: AC
  Filled 2014-01-16: qty 10

## 2014-01-16 MED ORDER — LIDOCAINE VISCOUS 2 % MT SOLN
OROMUCOSAL | Status: AC
  Filled 2014-01-16: qty 15

## 2014-01-16 MED ORDER — MIDAZOLAM HCL 5 MG/5ML IJ SOLN
INTRAMUSCULAR | Status: DC | PRN
  Administered 2014-01-16: 2 mg via INTRAVENOUS
  Administered 2014-01-16: 1 mg via INTRAVENOUS

## 2014-01-16 NOTE — Op Note (Signed)
[facility] [address] [place], [address]   ENDOSCOPY PROCEDURE REPORT  PATIENT: Brad Wall, Brad Wall  MR#: [id number] BIRTHDATE: 11/10/1945 , 49  yrs. old GENDER: Male ENDOSCOPIST: R.  Garfield Cornea, MD FACP FACG REFERRED BY:  Lemmie Evens, M.D. PROCEDURE DATE:  01/16/2014 PROCEDURE:     EGD with [name] dilation followed by KOH esophageal brushing and gastric biopsy  INDICATIONS:     esophageal dysphagia/abdominal pain  INFORMED CONSENT:   The risks, benefits, limitations, alternatives and imponderables have been discussed.  The potential for biopsy, esophogeal dilation, etc. have also been reviewed.  Questions have been answered.  All parties agreeable.  Please see the history and physical in the medical record for more information.  MEDICATIONS:  Versed 3 mg IV and Demerol 50 mg IV in divided doses. Zofran 4 mg IV. Xylocaine gel orally  DESCRIPTION OF PROCEDURE:   The [id number] ([id number])  endoscope was introduced through the mouth and advanced to the second portion of the duodenum without difficulty or limitations.  The mucosal surfaces were surveyed very carefully during advancement of the scope and upon withdrawal.  Retroflexion view of the proximal stomach and esophagogastric junction was performed.      FINDINGS: Somewhat pustular-appearing cream-colored plaques in the proximal esophagus. These were adherent mucosal plaques.   Tubular esophagus was patent throughout its course. 4 columns grade 1 esophageal varices. Stomach empty. Diffuse "snake-scanning"/"fishscale" appearance of the gastric mucosa consistent with portal gastropathy. No ulcer or infiltrating process. No gastric varices. Patent pylorus. Normal first and second portion of the duodenum.  THERAPEUTIC / DIAGNOSTIC MANEUVERS PERFORMED:  a 65 French Maloney dilators passed to full insertion easily. A look back revealed no apparent complication related to this maneuver. The  abnormal gastric mucosa was biopsied for histologic study. Finally, the proximal esophagus was brushed for KOH preparation.   COMPLICATIONS:  None  IMPRESSION:  Esophageal plaques - rule out Candida esophagitis. Grade 1 esophageal varices. Patent esophagus. Status post passage of a Maloney dilator. Portal gastropathy. Gastric erosions-status post biopsy  RECOMMENDATIONS:  No change in medical regimen for the time being. Followup on the above pending studies.    _______________________________ R. Garfield Cornea, MD FACP [facility] eSigned:  R. Garfield Cornea, MD FACP [facility] 01/16/2014 9:22 AM     CC:  PATIENT NAME:  Brad Wall, Brad Wall MR#: [id number]
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
## 2014-01-16 NOTE — Interval H&P Note (Signed)
History and Physical Interval Note:  01/16/2014 8:34 AM  Brad Wall  has presented today for surgery, with the diagnosis of NASH CIRRHOSIS  DYSPHAGIA  The various methods of treatment have been discussed with the patient and family. After consideration of risks, benefits and other options for treatment, the patient has consented to  Procedure(s) with comments: ESOPHAGOGASTRODUODENOSCOPY (EGD) (N/A) - 8:30 SAVORY DILATION (N/A) MALONEY DILATION (N/A) as a surgical intervention .  The patient's history has been reviewed, patient examined, no change in status, stable for surgery.  I have reviewed the patient's chart and labs.  Questions were answered to the patient's satisfaction.     Brad Wall  No change. MRI cirrhosis. No hepatoma seen. Small lesion in  pancreas  - stable likely a pseudocyst. EGD with possible esophageal dilation  per plan.

## 2014-01-16 NOTE — Discharge Instructions (Signed)
EGD Discharge instructions Please read the instructions outlined below and refer to this sheet in the next few weeks. These discharge instructions provide you with general information on caring for yourself after you leave the hospital. Your doctor may also give you specific instructions. While your treatment has been planned according to the most current medical practices available, unavoidable complications occasionally occur. If you have any problems or questions after discharge, please call your doctor. ACTIVITY  You may resume your regular activity but move at a slower pace for the next 24 hours.   Take frequent rest periods for the next 24 hours.   Walking will help expel (get rid of) the air and reduce the bloated feeling in your abdomen.   No driving for 24 hours (because of the anesthesia (medicine) used during the test).   You may shower.   Do not sign any important legal documents or operate any machinery for 24 hours (because of the anesthesia used during the test).  NUTRITION  Drink plenty of fluids.   You may resume your normal diet.   Begin with a light meal and progress to your normal diet.   Avoid alcoholic beverages for 24 hours or as instructed by your caregiver.  MEDICATIONS  You may resume your normal medications unless your caregiver tells you otherwise.  WHAT YOU CAN EXPECT TODAY  You may experience abdominal discomfort such as a feeling of fullness or gas pains.  FOLLOW-UP  Your doctor will discuss the results of your test with you.  SEEK IMMEDIATE MEDICAL ATTENTION IF ANY OF THE FOLLOWING OCCUR:  Excessive nausea (feeling sick to your stomach) and/or vomiting.   Severe abdominal pain and distention (swelling).   Trouble swallowing.   Temperature over 101 F (37.8 C).   Rectal bleeding or vomiting of blood.   Continue present medical regimen.  Further recommendations to follow pending review of pathology report

## 2014-01-17 ENCOUNTER — Telehealth: Payer: Self-pay | Admitting: Gastroenterology

## 2014-01-17 MED ORDER — FLUCONAZOLE 200 MG PO TABS: 200.0000 mg | ORAL_TABLET | Freq: Every day | ORAL | Status: DC

## 2014-01-17 NOTE — Telephone Encounter (Signed)
Tried to call pt- LM with details, asked them to call back if they did not understand.

## 2014-01-17 NOTE — Telephone Encounter (Signed)
Patient has candida esophagitis. I discussed with Dorothea Ogle, the pharmacist at [facility]. The concentration of Celexa can increase when taken with Diflucan. It would be best if he [place] 1/2 [place]. Dorothea Ogle also stated that when he runs the medication through, he will call us if there are any other issues.   Reviewed recent labs. Cr normal. Creatinine clearance greater than 60, so no renal dosage adjustment.    Please call patient and let him know that he will take 2 tablets of Diflucan as a loading dose, then once daily for a total of 21 days. WHILE ON DIFLUCAN, only take 1/2 dose of Celexa daily.   Orvil Feil, ANP-BC [facility]

## 2014-01-17 NOTE — Telephone Encounter (Signed)
Noted and appreciated

## 2014-01-18 ENCOUNTER — Encounter: Payer: Self-pay | Admitting: Internal Medicine

## 2014-01-18 ENCOUNTER — Encounter (HOSPITAL_COMMUNITY): Payer: Self-pay | Admitting: Internal Medicine

## 2014-01-18 ENCOUNTER — Telehealth: Payer: Self-pay | Admitting: Internal Medicine

## 2014-01-18 NOTE — Telephone Encounter (Signed)
Pts wife called; having a flare of abdominal and chest pain; extensive work-up recently - candida esophagitis - started diflucan yesterday.  No new symptoms. I recommended he complete a course of diflucan and f/u as directed

## 2014-01-18 NOTE — OR Nursing (Signed)
Post op called done, pt stating pain is unbearable, pt has taken pain medication and called PCP, pt advised to Call Dr. [name] office, or got to the ED if he feels this pain.

## 2014-01-22 ENCOUNTER — Encounter (HOSPITAL_BASED_OUTPATIENT_CLINIC_OR_DEPARTMENT_OTHER): Payer: Medicare Other

## 2014-01-22 VITALS — BP 93/57 | HR 73 | Temp 98.5°F | Resp 16

## 2014-01-22 DIAGNOSIS — K7581 Nonalcoholic steatohepatitis (NASH): Secondary | ICD-10-CM

## 2014-01-22 DIAGNOSIS — D500 Iron deficiency anemia secondary to blood loss (chronic): Secondary | ICD-10-CM

## 2014-01-22 DIAGNOSIS — D509 Iron deficiency anemia, unspecified: Secondary | ICD-10-CM | POA: Diagnosis not present

## 2014-01-22 DIAGNOSIS — K7460 Unspecified cirrhosis of liver: Secondary | ICD-10-CM

## 2014-01-22 MED ORDER — SODIUM CHLORIDE 0.9 % IV SOLN
INTRAVENOUS | Status: DC
  Administered 2014-01-22: 12:00:00 via INTRAVENOUS

## 2014-01-22 MED ORDER — FERUMOXYTOL INJECTION 510 MG/17 ML
510.0000 mg | Freq: Once | INTRAVENOUS | Status: DC
  Filled 2014-01-22: qty 17

## 2014-01-22 MED ORDER — SODIUM CHLORIDE 0.9 % IV SOLN
510.0000 mg | Freq: Once | INTRAVENOUS | Status: AC
  Administered 2014-01-22: 510 mg via INTRAVENOUS
  Filled 2014-01-22: qty 17

## 2014-01-22 MED ORDER — SODIUM CHLORIDE 0.9 % IJ SOLN
10.0000 mL | Freq: Once | INTRAMUSCULAR | Status: AC
  Administered 2014-01-22: 10 mL via INTRAVENOUS

## 2014-01-22 NOTE — Progress Notes (Signed)
Tolerated well

## 2014-01-28 DIAGNOSIS — M5430 Sciatica, unspecified side: Secondary | ICD-10-CM | POA: Diagnosis not present

## 2014-01-28 DIAGNOSIS — G894 Chronic pain syndrome: Secondary | ICD-10-CM | POA: Diagnosis not present

## 2014-01-28 DIAGNOSIS — R1011 Right upper quadrant pain: Secondary | ICD-10-CM | POA: Diagnosis not present

## 2014-02-06 ENCOUNTER — Other Ambulatory Visit: Payer: Self-pay | Admitting: Gastroenterology

## 2014-03-12 ENCOUNTER — Telehealth: Payer: Self-pay | Admitting: Internal Medicine

## 2014-03-12 NOTE — Telephone Encounter (Signed)
Pt's wife is aware of OV with LSL tomorrow at 230pm

## 2014-03-12 NOTE — Telephone Encounter (Signed)
noted 

## 2014-03-12 NOTE — Telephone Encounter (Signed)
Pt's wife called to let us know that patient isn't feeling well and she is concerned. She wants him seen ASAP by [facility] and she said that going to the ER was out of the question because last time he waited 4 hours and still wasn't seen. I told her that we are scheduled out at least a month maybe longer and the nurse would be in later today. Please advise if we need to use an URG spot. [phone]

## 2014-03-13 ENCOUNTER — Encounter (INDEPENDENT_AMBULATORY_CARE_PROVIDER_SITE_OTHER): Payer: Self-pay

## 2014-03-13 ENCOUNTER — Ambulatory Visit (INDEPENDENT_AMBULATORY_CARE_PROVIDER_SITE_OTHER): Payer: Medicare Other | Admitting: Gastroenterology

## 2014-03-13 VITALS — BP 103/63 | HR 94 | Temp 98.3°F | Ht 69.0 in | Wt 146.4 lb

## 2014-03-13 DIAGNOSIS — K5732 Diverticulitis of large intestine without perforation or abscess without bleeding: Secondary | ICD-10-CM | POA: Diagnosis not present

## 2014-03-13 DIAGNOSIS — K5792 Diverticulitis of intestine, part unspecified, without perforation or abscess without bleeding: Secondary | ICD-10-CM

## 2014-03-13 MED ORDER — CIPROFLOXACIN HCL 500 MG PO TABS: 500.0000 mg | ORAL_TABLET | Freq: Two times a day (BID) | ORAL | Status: DC

## 2014-03-13 MED ORDER — METRONIDAZOLE 500 MG PO TABS: 500.0000 mg | ORAL_TABLET | Freq: Three times a day (TID) | ORAL | Status: DC
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
## 2014-03-13 NOTE — Patient Instructions (Signed)
1. Cipro and Flagyl for 10 days. 2. For next 48 hours you need to consume liquid diet and advance to soft foods. Avoid high fiber while on antibiotic therapy. 3. Call if symptoms worsen or you do not improve. 4. Follow up in 06/2014 for your liver disease as planned.   Diverticulitis Diverticulitis is inflammation or infection of small pouches in your colon that form when you have a condition called diverticulosis. The pouches in your colon are called diverticula. Your colon, or large intestine, is where water is absorbed and stool is formed. Complications of diverticulitis can include:  Bleeding.  Severe infection.  Severe pain.  Perforation of your colon.  Obstruction of your colon. CAUSES  Diverticulitis is caused by bacteria. Diverticulitis happens when stool becomes trapped in diverticula. This allows bacteria to grow in the diverticula, which can lead to inflammation and infection. RISK FACTORS People with diverticulosis are at risk for diverticulitis. Eating a diet that does not include enough fiber from fruits and vegetables may make diverticulitis more likely to develop. SYMPTOMS  Symptoms of diverticulitis may include:  Abdominal pain and tenderness. The pain is normally located on the left side of the abdomen, but may occur in other areas.  Fever and chills.  Bloating.  Cramping.  Nausea.  Vomiting.  Constipation.  Diarrhea.  Blood in your stool. DIAGNOSIS  Your health care provider will ask you about your medical history and do a physical exam. You may need to have tests done because many medical conditions can cause the same symptoms as diverticulitis. Tests may include:  Blood tests.  Urine tests.  Imaging tests of the abdomen, including X-rays and CT scans. When your condition is under control, your health care provider may recommend that you have a colonoscopy. A colonoscopy can show how severe your diverticula are and whether something else is  causing your symptoms. TREATMENT  Most cases of diverticulitis are mild and can be treated at home. Treatment may include:  Taking over-the-counter pain medicines.  Following a clear liquid diet.  Taking antibiotic medicines by mouth for 7-10 days. More severe cases may be treated at a hospital. Treatment may include:  Not eating or drinking.  Taking prescription pain medicine.  Receiving antibiotic medicines through an IV tube.  Receiving fluids and nutrition through an IV tube.  Surgery. HOME CARE INSTRUCTIONS   Follow your health care provider's instructions carefully.  Follow a full liquid diet or other diet as directed by your health care provider. After your symptoms improve, your health care provider may tell you to change your diet. He or she may recommend you eat a high-fiber diet. Fruits and vegetables are good sources of fiber. Fiber makes it easier to pass stool.  Take fiber supplements or probiotics as directed by your health care provider.  Only take medicines as directed by your health care provider.  Keep all your follow-up appointments. SEEK MEDICAL CARE IF:   Your pain does not improve.  You have a hard time eating food.  Your bowel movements do not return to normal. SEEK IMMEDIATE MEDICAL CARE IF:   Your pain becomes worse.  Your symptoms do not get better.  Your symptoms suddenly get worse.  You have a fever.  You have repeated vomiting.  You have bloody or black, tarry stools. MAKE SURE YOU:   Understand these instructions.  Will watch your condition.  Will get help right away if you are not doing well or get worse. Document Released: 03/24/2005 Document Revised:  06/19/2013 Document Reviewed: 05/09/2013 ExitCare Patient Information 2015 [place], [place]. This information is not intended to replace advice given to you by your health care provider. Make sure you discuss any questions you have with your health care provider.

## 2014-03-17 NOTE — Assessment & Plan Note (Signed)
Suspected acute diverticulitis. Start Cipro and Flagyl. Clear liquid diet for 48 hours and advance to low residue as tolerated. Once symptoms cleared, then high fiber diet. Call within persistent or worsening symptoms. Handout provided. OV 06/2014 for cirrhosis care.

## 2014-03-19 NOTE — Progress Notes (Signed)
cc'ed to pcp °

## 2014-03-21 ENCOUNTER — Emergency Department (HOSPITAL_COMMUNITY)
Admission: EM | Admit: 2014-03-21 | Discharge: 2014-03-22 | Disposition: A | Discharge status: Home / Self Care | Payer: Medicare Other | Attending: Emergency Medicine | Admitting: Emergency Medicine

## 2014-03-21 ENCOUNTER — Emergency Department (HOSPITAL_COMMUNITY): Payer: Medicare Other

## 2014-03-21 ENCOUNTER — Encounter (HOSPITAL_COMMUNITY): Payer: Self-pay | Admitting: Emergency Medicine

## 2014-03-21 DIAGNOSIS — D509 Iron deficiency anemia, unspecified: Secondary | ICD-10-CM | POA: Insufficient documentation

## 2014-03-21 DIAGNOSIS — Z79899 Other long term (current) drug therapy: Secondary | ICD-10-CM | POA: Insufficient documentation

## 2014-03-21 DIAGNOSIS — Z792 Long term (current) use of antibiotics: Secondary | ICD-10-CM | POA: Diagnosis not present

## 2014-03-21 DIAGNOSIS — R197 Diarrhea, unspecified: Secondary | ICD-10-CM | POA: Diagnosis not present

## 2014-03-21 DIAGNOSIS — Z7982 Long term (current) use of aspirin: Secondary | ICD-10-CM | POA: Diagnosis not present

## 2014-03-21 DIAGNOSIS — G44019 Episodic cluster headache, not intractable: Secondary | ICD-10-CM | POA: Insufficient documentation

## 2014-03-21 DIAGNOSIS — F329 Major depressive disorder, single episode, unspecified: Secondary | ICD-10-CM | POA: Diagnosis not present

## 2014-03-21 DIAGNOSIS — Z794 Long term (current) use of insulin: Secondary | ICD-10-CM | POA: Diagnosis not present

## 2014-03-21 DIAGNOSIS — F411 Generalized anxiety disorder: Secondary | ICD-10-CM | POA: Diagnosis not present

## 2014-03-21 DIAGNOSIS — E785 Hyperlipidemia, unspecified: Secondary | ICD-10-CM | POA: Insufficient documentation

## 2014-03-21 DIAGNOSIS — N200 Calculus of kidney: Secondary | ICD-10-CM | POA: Diagnosis not present

## 2014-03-21 DIAGNOSIS — Z9889 Other specified postprocedural states: Secondary | ICD-10-CM | POA: Diagnosis not present

## 2014-03-21 DIAGNOSIS — Z9089 Acquired absence of other organs: Secondary | ICD-10-CM | POA: Insufficient documentation

## 2014-03-21 DIAGNOSIS — R109 Unspecified abdominal pain: Secondary | ICD-10-CM | POA: Insufficient documentation

## 2014-03-21 DIAGNOSIS — K5730 Diverticulosis of large intestine without perforation or abscess without bleeding: Secondary | ICD-10-CM | POA: Diagnosis not present

## 2014-03-21 DIAGNOSIS — Z87891 Personal history of nicotine dependence: Secondary | ICD-10-CM | POA: Diagnosis not present

## 2014-03-21 DIAGNOSIS — K2970 Gastritis, unspecified, without bleeding: Secondary | ICD-10-CM | POA: Diagnosis not present

## 2014-03-21 DIAGNOSIS — Z8619 Personal history of other infectious and parasitic diseases: Secondary | ICD-10-CM | POA: Diagnosis not present

## 2014-03-21 DIAGNOSIS — K219 Gastro-esophageal reflux disease without esophagitis: Secondary | ICD-10-CM | POA: Diagnosis not present

## 2014-03-21 DIAGNOSIS — R1013 Epigastric pain: Secondary | ICD-10-CM | POA: Diagnosis not present

## 2014-03-21 DIAGNOSIS — Z880 Allergy status to penicillin: Secondary | ICD-10-CM | POA: Diagnosis not present

## 2014-03-21 DIAGNOSIS — Z8673 Personal history of transient ischemic attack (TIA), and cerebral infarction without residual deficits: Secondary | ICD-10-CM | POA: Diagnosis not present

## 2014-03-21 DIAGNOSIS — E119 Type 2 diabetes mellitus without complications: Secondary | ICD-10-CM | POA: Diagnosis not present

## 2014-03-21 DIAGNOSIS — F3289 Other specified depressive episodes: Secondary | ICD-10-CM | POA: Insufficient documentation

## 2014-03-21 DIAGNOSIS — G8929 Other chronic pain: Secondary | ICD-10-CM | POA: Diagnosis not present

## 2014-03-21 DIAGNOSIS — R1011 Right upper quadrant pain: Secondary | ICD-10-CM | POA: Insufficient documentation

## 2014-03-21 DIAGNOSIS — R509 Fever, unspecified: Secondary | ICD-10-CM | POA: Diagnosis not present

## 2014-03-21 DIAGNOSIS — I10 Essential (primary) hypertension: Secondary | ICD-10-CM | POA: Diagnosis not present

## 2014-03-21 DIAGNOSIS — R110 Nausea: Secondary | ICD-10-CM | POA: Diagnosis not present

## 2014-03-21 DIAGNOSIS — K2990 Gastroduodenitis, unspecified, without bleeding: Secondary | ICD-10-CM | POA: Diagnosis not present

## 2014-03-21 LAB — CBC WITH DIFFERENTIAL/PLATELET
BASOS ABS: 0 10*3/uL (ref 0.0–0.1)
Basophils Relative: 0 % (ref 0–1)
Eosinophils Absolute: 0.1 10*3/uL (ref 0.0–0.7)
Eosinophils Relative: 3 % (ref 0–5)
HEMATOCRIT: 36.1 % — AB (ref 39.0–52.0)
Hemoglobin: 12.3 g/dL — ABNORMAL LOW (ref 13.0–17.0)
LYMPHS ABS: 0.8 10*3/uL (ref 0.7–4.0)
Lymphocytes Relative: 16 % (ref 12–46)
MCH: 33.3 pg (ref 26.0–34.0)
MCHC: 34.1 g/dL (ref 30.0–36.0)
MCV: 97.8 fL (ref 78.0–100.0)
MONO ABS: 0.3 10*3/uL (ref 0.1–1.0)
Monocytes Relative: 7 % (ref 3–12)
Neutro Abs: 3.7 10*3/uL (ref 1.7–7.7)
Neutrophils Relative %: 74 % (ref 43–77)
PLATELETS: 97 10*3/uL — AB (ref 150–400)
RBC: 3.69 MIL/uL — ABNORMAL LOW (ref 4.22–5.81)
RDW: 13.8 % (ref 11.5–15.5)
WBC: 4.9 10*3/uL (ref 4.0–10.5)

## 2014-03-21 LAB — URINALYSIS, ROUTINE W REFLEX MICROSCOPIC
BILIRUBIN URINE: NEGATIVE
Glucose, UA: NEGATIVE mg/dL
HGB URINE DIPSTICK: NEGATIVE
Ketones, ur: NEGATIVE mg/dL
Leukocytes, UA: NEGATIVE
Nitrite: NEGATIVE
PROTEIN: NEGATIVE mg/dL
Specific Gravity, Urine: <1.005 — ABNORMAL LOW (ref 1.005–1.030)
UROBILINOGEN UA: 0.2 mg/dL (ref 0.0–1.0)
pH: 5.5 (ref 5.0–8.0)

## 2014-03-21 LAB — COMPREHENSIVE METABOLIC PANEL
ALBUMIN: 4.3 g/dL (ref 3.5–5.2)
ALT: 51 U/L (ref 0–53)
AST: 66 U/L — AB (ref 0–37)
Alkaline Phosphatase: 185 U/L — ABNORMAL HIGH (ref 39–117)
Anion gap: 13 (ref 5–15)
BUN: 12 mg/dL (ref 6–23)
CO2: 22 mEq/L (ref 19–32)
CREATININE: 1.15 mg/dL (ref 0.50–1.35)
Calcium: 9.4 mg/dL (ref 8.4–10.5)
Chloride: 102 mEq/L (ref 96–112)
GFR calc Af Amer: 75 mL/min — ABNORMAL LOW (ref >90)
GFR, EST NON AFRICAN AMERICAN: 65 mL/min — AB (ref >90)
Glucose, Bld: 111 mg/dL — ABNORMAL HIGH (ref 70–99)
Potassium: 4.2 mEq/L (ref 3.7–5.3)
Sodium: 137 mEq/L (ref 137–147)
Total Bilirubin: 0.4 mg/dL (ref 0.3–1.2)
Total Protein: 7.5 g/dL (ref 6.0–8.3)

## 2014-03-21 LAB — AMMONIA: Ammonia: 49 umol/L (ref 11–60)

## 2014-03-21 LAB — LIPASE, BLOOD: LIPASE: 24 U/L (ref 11–59)

## 2014-03-21 MED ORDER — SODIUM CHLORIDE 0.9 % IV SOLN
INTRAVENOUS | Status: DC
  Administered 2014-03-21: via INTRAVENOUS

## 2014-03-21 MED ORDER — IOHEXOL 300 MG/ML  SOLN: 50.0000 mL | Freq: Once | INTRAMUSCULAR | Status: AC | PRN

## 2014-03-21 MED ORDER — FENTANYL CITRATE 0.05 MG/ML IJ SOLN
50.0000 ug | Freq: Once | INTRAMUSCULAR | Status: AC
  Administered 2014-03-21: 50 ug via INTRAVENOUS
  Filled 2014-03-21: qty 2

## 2014-03-21 MED ORDER — METOCLOPRAMIDE HCL 5 MG/ML IJ SOLN
10.0000 mg | Freq: Once | INTRAMUSCULAR | Status: AC
  Administered 2014-03-21: 10 mg via INTRAVENOUS
  Filled 2014-03-21: qty 2

## 2014-03-21 MED ORDER — DIPHENHYDRAMINE HCL 50 MG/ML IJ SOLN
25.0000 mg | Freq: Once | INTRAMUSCULAR | Status: AC
  Administered 2014-03-21: 25 mg via INTRAVENOUS
  Filled 2014-03-21: qty 1

## 2014-03-21 NOTE — ED Notes (Signed)
Pt presents with complaints of increasing abdominal pain after exertion during the course of the day. Pt has distended RUQ with tenderness; bowel sounds present in all 4 quadrants. Pt presently with c/o headache; pt reports he usually take oxycodone for pain but is requesting an IV "headache cocktail".
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
## 2014-03-21 NOTE — ED Notes (Signed)
Patient states right sided abdominal pain that started at 1700. Patient states it is painful to touch. Patient has a history of cirrhosis of the liver and ascites. Patient is also complaining of a headache with a history of the same. #20g IV started by [facility]. Patient is A&OX4

## 2014-03-22 MED ORDER — IOHEXOL 300 MG/ML  SOLN
100.0000 mL | Freq: Once | INTRAMUSCULAR | Status: AC | PRN
  Administered 2014-03-22: 100 mL via INTRAVENOUS

## 2014-03-22 MED ORDER — IOHEXOL 300 MG/ML  SOLN
50.0000 mL | Freq: Once | INTRAMUSCULAR | Status: AC | PRN
  Administered 2014-03-22: 50 mL via ORAL

## 2014-03-22 NOTE — Discharge Instructions (Signed)
Abdominal Pain °Many things can cause abdominal pain. Usually, abdominal pain is not caused by a disease and will improve without treatment. It can often be observed and treated at home. Your health care provider will do a physical exam and possibly order blood tests and X-rays to help determine the seriousness of your pain. However, in many cases, more time must pass before a clear cause of the pain can be found. Before that point, your health care provider may not know if you need more testing or further treatment. °HOME CARE INSTRUCTIONS  °Monitor your abdominal pain for any changes. The following actions may help to alleviate any discomfort you are experiencing: °· Only take over-the-counter or prescription medicines as directed by your health care provider. °· Do not take laxatives unless directed to do so by your health care provider. °· Try a clear liquid diet (broth, tea, or water) as directed by your health care provider. Slowly move to a bland diet as tolerated. °SEEK MEDICAL CARE IF: °· You have unexplained abdominal pain. °· You have abdominal pain associated with nausea or diarrhea. °· You have pain when you urinate or have a bowel movement. °· You experience abdominal pain that wakes you in the night. °· You have abdominal pain that is worsened or improved by eating food. °· You have abdominal pain that is worsened with eating fatty foods. °· You have a fever. °SEEK IMMEDIATE MEDICAL CARE IF:  °· Your pain does not go away within 2 hours. °· You keep throwing up (vomiting). °· Your pain is felt only in portions of the abdomen, such as the right side or the left lower portion of the abdomen. °· You pass bloody or black tarry stools. °MAKE SURE YOU: °· Understand these instructions.   °· Will watch your condition.   °· Will get help right away if you are not doing well or get worse.   °Document Released: 03/24/2005 Document Revised: 06/19/2013 Document Reviewed: 02/21/2013 °ExitCare® Patient Information  ©2015 [employer]. This information is not intended to replace advice given to you by your health care provider. Make sure you discuss any questions you have with your health care provider. ° °General Headache Without Cause °A headache is pain or discomfort felt around the head or neck area. The specific cause of a headache may not be found. There are many causes and types of headaches. A few common ones are: °· Tension headaches. °· Migraine headaches. °· Cluster headaches. °· Chronic daily headaches. °HOME CARE INSTRUCTIONS  °· Keep all follow-up appointments with your caregiver or any specialist referral. °· Only take over-the-counter or prescription medicines for pain or discomfort as directed by your caregiver. °· Lie down in a dark, quiet room when you have a headache. °· Keep a headache journal to find out what may trigger your migraine headaches. For example, write down: °¨ What you eat and drink. °¨ How much sleep you get. °¨ Any change to your diet or medicines. °· Try massage or other relaxation techniques. °· Put ice packs or heat on the head and neck. Use these 3 to 4 times per day for 15 to 20 minutes each time, or as needed. °· Limit stress. °· Sit up straight, and do not tense your muscles. °· Quit smoking if you smoke. °· Limit alcohol use. °· Decrease the amount of caffeine you drink, or stop drinking caffeine. °· Eat and sleep on a regular schedule. °· Get 7 to 9 hours of sleep, or as recommended by your caregiver. °·   Keep lights dim if bright lights bother you and make your headaches worse. °SEEK MEDICAL CARE IF:  °· You have problems with the medicines you were prescribed. °· Your medicines are not working. °· You have a change from the usual headache. °· You have nausea or vomiting. °SEEK IMMEDIATE MEDICAL CARE IF:  °· Your headache becomes severe. °· You have a fever. °· You have a stiff neck. °· You have loss of vision. °· You have muscular weakness or loss of muscle control. °· You start  losing your balance or have trouble walking. °· You feel faint or pass out. °· You have severe symptoms that are different from your first symptoms. °MAKE SURE YOU:  °· Understand these instructions. °· Will watch your condition. °· Will get help right away if you are not doing well or get worse. °Document Released: 06/14/2005 Document Revised: 09/06/2011 Document Reviewed: 06/30/2011 °Exit[employer]® Patient Information ©2015 [employer]. This information is not intended to replace advice given to you by your health care provider. Make sure you discuss any questions you have with your health care provider. ° °

## 2014-03-22 NOTE — ED Provider Notes (Signed)
  Physical Exam  BP 113/75  Pulse 102  Temp(Src) 98.1 F (36.7 C) (Oral)  Resp 16  Ht 5\' 9"  (1.753 m)  Wt 145 lb (65.772 kg)  BMI 21.40 kg/m2  SpO2 100%  Physical Exam  ED Course  Procedures  MDM CT scan reassuring. Headache improved. Will discharge home    Jasper Riling. Alvino Chapel, MD 03/22/14 [phone]

## 2014-03-22 NOTE — ED Notes (Signed)
Patient and patients family verbalize understanding of discharge instructions, follow up care, and home car/pain management. Patient ambulatory out of department at this time with family.

## 2014-03-27 ENCOUNTER — Telehealth: Payer: Self-pay | Admitting: Internal Medicine

## 2014-03-27 NOTE — Telephone Encounter (Signed)
[facility]

## 2014-03-27 NOTE — Telephone Encounter (Signed)
PATIENT WIFE CALLED STATING HUSBAND IS HAVING INTERMITTENT ABD PAIN WHEN EATING.  PLEASE CALL HER AT [place]

## 2014-03-28 NOTE — Telephone Encounter (Signed)
I spoke with the pts wife, he finished the cipro and flagyl on Sunday and was feeling a little better but has had 2 episodes in the past week where he would be eating supper and suddenly feel severe nausea and had to take zofran. She said he is loosing weight. She reports he is down to 138lbs. He also had an episode of severe upper abd pain after eating a heavy [place] dinner on 03/21/14 and the wife had to call EMS and they took him to the ED. Blood work and CT was done and are in [employer]. He has been more sleepy that usual, on the 24th his ammonia level was normal.   She also wanted to let LSL know that they have changed his topomax dosage. He was taking 50mg  in the AM and 100mg  in the PM. His pain doctor increased it to 100mg  in the AM and 100mg  in the PM but that was causing him to fall asleep while eating. Now the pt is only on 100mg  at night.   She wants to know if there is anything she needs to do.

## 2014-03-28 NOTE — Telephone Encounter (Signed)
Agree with dropping back on topamax dose given hepatic impairment.   Reviewed labs and CT. No significant findings or changes.   Titrate lactulose to 3-4 soft BMs daily. Patient wasn't going well at last OV.  Avoid fatty meals. Encourage soft/bland food.   Continue Nexium BID.   Semi-urgent OV with RMR only please.

## 2014-03-28 NOTE — Telephone Encounter (Signed)
RMR HAD CANCELLATION 10/06 SO WE PUT HIM IN THAT SPOT AND NOTIFIED WIFE OF TIME AND DATE

## 2014-03-28 NOTE — Telephone Encounter (Signed)
pts wife is aware. We went over the bland diet.  Please schedule semi-urgent ov with RMR.

## 2014-03-29 DIAGNOSIS — Z23 Encounter for immunization: Secondary | ICD-10-CM | POA: Diagnosis not present

## 2014-04-02 ENCOUNTER — Encounter: Payer: Self-pay | Admitting: Internal Medicine

## 2014-04-02 ENCOUNTER — Ambulatory Visit (INDEPENDENT_AMBULATORY_CARE_PROVIDER_SITE_OTHER): Payer: Medicare Other | Admitting: Internal Medicine

## 2014-04-02 VITALS — BP 109/68 | HR 92 | Temp 98.4°F | Ht 69.0 in | Wt 143.8 lb

## 2014-04-02 DIAGNOSIS — K7290 Hepatic failure, unspecified without coma: Secondary | ICD-10-CM

## 2014-04-02 DIAGNOSIS — K7581 Nonalcoholic steatohepatitis (NASH): Secondary | ICD-10-CM | POA: Diagnosis not present

## 2014-04-02 DIAGNOSIS — K5732 Diverticulitis of large intestine without perforation or abscess without bleeding: Secondary | ICD-10-CM

## 2014-04-02 DIAGNOSIS — K7682 Hepatic encephalopathy: Secondary | ICD-10-CM

## 2014-04-02 DIAGNOSIS — K219 Gastro-esophageal reflux disease without esophagitis: Secondary | ICD-10-CM | POA: Diagnosis not present

## 2014-04-02 NOTE — Patient Instructions (Addendum)
Continue Nexium, Inderal and titrate lactulose to 3-4 sem-formed stools daily  Adhere to a diabetic diet (will help with your liver too)  CBC, BMET and OV in 3 months  As discussed, driving and operating machinery is not recommended.
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
## 2014-04-17 ENCOUNTER — Ambulatory Visit: Payer: Medicare Other | Admitting: Gastroenterology

## 2014-04-25 ENCOUNTER — Encounter: Payer: Self-pay | Admitting: Internal Medicine

## 2014-04-30 ENCOUNTER — Other Ambulatory Visit: Payer: Self-pay | Admitting: Gastroenterology

## 2014-05-01 DIAGNOSIS — R1010 Upper abdominal pain, unspecified: Secondary | ICD-10-CM | POA: Diagnosis not present

## 2014-05-01 DIAGNOSIS — Z5181 Encounter for therapeutic drug level monitoring: Secondary | ICD-10-CM | POA: Diagnosis not present

## 2014-05-01 DIAGNOSIS — M5442 Lumbago with sciatica, left side: Secondary | ICD-10-CM | POA: Diagnosis not present

## 2014-05-01 DIAGNOSIS — G894 Chronic pain syndrome: Secondary | ICD-10-CM | POA: Diagnosis not present

## 2014-05-13 ENCOUNTER — Other Ambulatory Visit (HOSPITAL_COMMUNITY): Payer: Self-pay | Admitting: Oncology

## 2014-05-13 ENCOUNTER — Encounter (HOSPITAL_COMMUNITY): Payer: Medicare Other | Attending: Hematology and Oncology

## 2014-05-13 DIAGNOSIS — D696 Thrombocytopenia, unspecified: Secondary | ICD-10-CM | POA: Diagnosis not present

## 2014-05-13 DIAGNOSIS — D509 Iron deficiency anemia, unspecified: Secondary | ICD-10-CM | POA: Insufficient documentation

## 2014-05-13 DIAGNOSIS — K7460 Unspecified cirrhosis of liver: Secondary | ICD-10-CM | POA: Insufficient documentation

## 2014-05-13 DIAGNOSIS — K7581 Nonalcoholic steatohepatitis (NASH): Secondary | ICD-10-CM

## 2014-05-13 LAB — CBC WITH DIFFERENTIAL/PLATELET
BASOS ABS: 0 10*3/uL (ref 0.0–0.1)
BASOS PCT: 0 % (ref 0–1)
EOS ABS: 0.2 10*3/uL (ref 0.0–0.7)
EOS PCT: 2 % (ref 0–5)
HCT: 38.3 % — ABNORMAL LOW (ref 39.0–52.0)
Hemoglobin: 12.3 g/dL — ABNORMAL LOW (ref 13.0–17.0)
Lymphocytes Relative: 10 % — ABNORMAL LOW (ref 12–46)
Lymphs Abs: 0.9 10*3/uL (ref 0.7–4.0)
MCH: 32 pg (ref 26.0–34.0)
MCHC: 32.1 g/dL (ref 30.0–36.0)
MCV: 99.7 fL (ref 78.0–100.0)
Monocytes Absolute: 0.4 10*3/uL (ref 0.1–1.0)
Monocytes Relative: 4 % (ref 3–12)
Neutro Abs: 7.3 10*3/uL (ref 1.7–7.7)
Neutrophils Relative %: 84 % — ABNORMAL HIGH (ref 43–77)
Platelets: 129 10*3/uL — ABNORMAL LOW (ref 150–400)
RBC: 3.84 MIL/uL — AB (ref 4.22–5.81)
RDW: 13.6 % (ref 11.5–15.5)
WBC: 8.8 10*3/uL (ref 4.0–10.5)

## 2014-05-13 LAB — FERRITIN: FERRITIN: 32 ng/mL (ref 22–322)

## 2014-05-13 LAB — IRON AND TIBC
Iron: 66 ug/dL (ref 42–135)
Saturation Ratios: 16 % — ABNORMAL LOW (ref 20–55)
TIBC: 423 ug/dL (ref 215–435)
UIBC: 357 ug/dL (ref 125–400)

## 2014-05-13 NOTE — Progress Notes (Signed)
-  Rescheduled-  KEFALAS,THOMAS  

## 2014-05-14 NOTE — Progress Notes (Signed)
Lab draw
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
## 2014-05-15 ENCOUNTER — Encounter (HOSPITAL_BASED_OUTPATIENT_CLINIC_OR_DEPARTMENT_OTHER): Payer: Medicare Other

## 2014-05-15 ENCOUNTER — Encounter (HOSPITAL_BASED_OUTPATIENT_CLINIC_OR_DEPARTMENT_OTHER): Payer: Medicare Other | Admitting: Oncology

## 2014-05-15 VITALS — BP 104/66 | HR 90 | Temp 98.4°F | Resp 18 | Wt 144.6 lb

## 2014-05-15 VITALS — BP 97/62 | HR 95 | Resp 18

## 2014-05-15 DIAGNOSIS — K7460 Unspecified cirrhosis of liver: Secondary | ICD-10-CM

## 2014-05-15 DIAGNOSIS — K7581 Nonalcoholic steatohepatitis (NASH): Secondary | ICD-10-CM | POA: Diagnosis not present

## 2014-05-15 DIAGNOSIS — D696 Thrombocytopenia, unspecified: Secondary | ICD-10-CM | POA: Diagnosis not present

## 2014-05-15 DIAGNOSIS — R1011 Right upper quadrant pain: Secondary | ICD-10-CM | POA: Diagnosis not present

## 2014-05-15 DIAGNOSIS — R1013 Epigastric pain: Secondary | ICD-10-CM

## 2014-05-15 DIAGNOSIS — D509 Iron deficiency anemia, unspecified: Secondary | ICD-10-CM

## 2014-05-15 DIAGNOSIS — R1012 Left upper quadrant pain: Secondary | ICD-10-CM | POA: Diagnosis not present

## 2014-05-15 DIAGNOSIS — G8929 Other chronic pain: Secondary | ICD-10-CM

## 2014-05-15 MED ORDER — SODIUM CHLORIDE 0.9 % IV SOLN
510.0000 mg | Freq: Once | INTRAVENOUS | Status: AC
  Administered 2014-05-15: 510 mg via INTRAVENOUS
  Filled 2014-05-15: qty 17

## 2014-05-15 NOTE — Patient Instructions (Addendum)
[facility] Discharge Instructions  RECOMMENDATIONS MADE BY THE CONSULTANT AND ANY TEST RESULTS WILL BE SENT TO YOUR REFERRING PHYSICIAN.  I have reviewed your labs with you today.  Your iron is low and therefore we have you scheduled for IV iron today and again next week. We will repeat labs in 6 weeks and 12 weeks. Discontinue iron tablets for now.  These may be contributing to your belly pain. Follow-up with Neurology as scheduled. Follow-up with GI as directed. Return in 12 weeks for follow-up  Thank you for choosing [facility] to provide your oncology and hematology care.  To afford each patient quality time with our providers, please arrive at least 15 minutes before your scheduled appointment time.  With your help, our goal is to use those 15 minutes to complete the necessary work-up to ensure our physicians have the information they need to help with your evaluation and healthcare recommendations.    Effective January 1st, 2014, we ask that you re-schedule your appointment with our physicians should you arrive 10 or more minutes late for your appointment.  We strive to give you quality time with our providers, and arriving late affects you and other patients whose appointments are after yours.    Again, thank you for choosing [facility].  Our hope is that these requests will decrease the amount of time that you wait before being seen by our physicians.       _____________________________________________________________  Should you have questions after your visit to [facility], please contact our office at [phone] between the hours of 8:30 a.m. and 5:00 p.m.  Voicemails left after 4:30 p.m. will not be returned until the following business day.  For prescription refill requests, have your pharmacy contact our office with your prescription refill request.

## 2014-05-15 NOTE — Progress Notes (Signed)
Brad Wall Tolerated iron infusion well today.  Discharged ambulatory.

## 2014-05-15 NOTE — Patient Instructions (Signed)
You had an iron infusion today. Please call the clinic if you have any questions or concerns

## 2014-05-16 ENCOUNTER — Ambulatory Visit (HOSPITAL_COMMUNITY): Payer: Medicare Other | Admitting: Oncology

## 2014-05-17 ENCOUNTER — Telehealth: Payer: Self-pay

## 2014-05-17 ENCOUNTER — Telehealth: Payer: Self-pay | Admitting: *Deleted

## 2014-05-17 NOTE — Telephone Encounter (Signed)
He is not overtly bleeding from his varices. With portal gastropathy, likely contributing to IDA. He is up-to-date on his TCS/EGD, and as long as there is NO melena, hematochezia, or hematemesis, he is GOOD.   On Inderal 20 mg BID for variceal bleeding prophylaxis. Want his pulse around 55. However, if he is having hypotension and symptomatic, could hold today's dose and restart tomorrow. Sounds like orthostatic response with standing up quickly, and then he feels ok otherwise.   No need to be concerned about bleeding varices UNLESS he has the melena, hematochezia, hematemesis. In that case, would need to go to the nearest ED.

## 2014-05-17 NOTE — Telephone Encounter (Signed)
pts wife Brad Wall)  called- pt went to [facility] for iron infusion yesterday, when he got there his bp was 97/62 after the infusion it was 87/62. He is feeling ok, he sometimes gets light headed when he stands up quickly but other than that he is feeling ok. They are aware that he needs to keep his bp lower d/t the varices but she is concerned that this is too low. Wife also said they asked Brad Wall why he was anemic and she states that [name] told them that he was bleeding from the varices in his esophagus. Pt and his wife are extremely concerned about it.   Brad Wall wants to know if she needs to be worried about his bp or his varices?

## 2014-05-17 NOTE — Telephone Encounter (Signed)
Dr. [name]- please see the other phone note from 05/17/14. Vicente Males addressed this. Do you have any further recommendations?

## 2014-05-17 NOTE — Telephone Encounter (Signed)
pts wife is aware. 

## 2014-05-17 NOTE — Telephone Encounter (Signed)
PATIENT WIFE CALLED AGAIN, PLEASE CALL BACK ASAP.  SHE WILL BE HOME UNTIL 12:30

## 2014-05-18 NOTE — Telephone Encounter (Signed)
bp 104/66 and pulse 90 as reported in hematology notes one 11/18; hgb 12 5 days ago.  It reported no bleeding.  Inadequate beta blockade w HR 90 but may not tolerate inc dose of inderal; lets get him in to see extender.

## 2014-05-20 NOTE — Telephone Encounter (Signed)
Patient wife aware of appt date and time

## 2014-05-20 NOTE — Telephone Encounter (Signed)
Brad Wall, will schedule pt ov with extender per RMR

## 2014-05-22 ENCOUNTER — Encounter (HOSPITAL_BASED_OUTPATIENT_CLINIC_OR_DEPARTMENT_OTHER): Payer: Medicare Other

## 2014-05-22 ENCOUNTER — Encounter (HOSPITAL_COMMUNITY): Payer: Medicare Other

## 2014-05-22 VITALS — BP 94/60 | HR 70 | Temp 98.1°F | Resp 18

## 2014-05-22 DIAGNOSIS — D509 Iron deficiency anemia, unspecified: Secondary | ICD-10-CM | POA: Diagnosis not present

## 2014-05-22 MED ORDER — SODIUM CHLORIDE 0.9 % IV SOLN
INTRAVENOUS | Status: DC
  Administered 2014-05-22: 12:00:00 via INTRAVENOUS

## 2014-05-22 MED ORDER — SODIUM CHLORIDE 0.9 % IV SOLN: 510.0000 mg | Freq: Once | INTRAVENOUS | Status: DC

## 2014-05-22 MED ORDER — SODIUM CHLORIDE 0.9 % IV SOLN
510.0000 mg | Freq: Once | INTRAVENOUS | Status: AC
  Administered 2014-05-22: 510 mg via INTRAVENOUS
  Filled 2014-05-22: qty 17

## 2014-05-22 MED ORDER — SODIUM CHLORIDE 0.9 % IV SOLN
510.0000 mg | Freq: Once | INTRAVENOUS | Status: DC
  Filled 2014-05-22: qty 17

## 2014-05-22 MED ORDER — SODIUM CHLORIDE 0.9 % IJ SOLN
10.0000 mL | Freq: Once | INTRAMUSCULAR | Status: AC
  Administered 2014-05-22: 10 mL via INTRAVENOUS

## 2014-05-22 NOTE — Progress Notes (Signed)
Tolerated well

## 2014-05-22 NOTE — Patient Instructions (Signed)
[facility] Discharge Instructions  RECOMMENDATIONS MADE BY THE CONSULTANT AND ANY TEST RESULTS WILL BE SENT TO YOUR REFERRING PHYSICIAN. Feraheme infusion 2 of 2 today. INSTRUCTIONS/FOLLOW-UP: Return to clinic as scheduled.  Thank you for choosing [facility] to provide your oncology and hematology care.  To afford each patient quality time with our providers, please arrive at least 15 minutes before your scheduled appointment time.  With your help, our goal is to use those 15 minutes to complete the necessary work-up to ensure our physicians have the information they need to help with your evaluation and healthcare recommendations.    Effective January 1st, 2014, we ask that you re-schedule your appointment with our physicians should you arrive 10 or more minutes late for your appointment.  We strive to give you quality time with our providers, and arriving late affects you and other patients whose appointments are after yours.    Again, thank you for choosing [facility].  Our hope is that these requests will decrease the amount of time that you wait before being seen by our physicians.       _____________________________________________________________  Should you have questions after your visit to [facility], please contact our office at [phone] between the hours of 8:30 a.m. and 4:30 p.m.  Voicemails left after 4:30 p.m. will not be returned until the following business day.  For prescription refill requests, have your pharmacy contact our office with your prescription refill request.    _______________________________________________________________  We hope that we have given you very good care.  You may receive a patient satisfaction survey in the mail, please complete it and return it as soon as possible.  We value your feedback!  _______________________________________________________________  Have you asked about  our STAR program?  STAR stands for Survivorship Training and Rehabilitation, and this is a nationally recognized cancer care program that focuses on survivorship and rehabilitation.  Cancer and cancer treatments may cause problems, such as, pain, making you feel tired and keeping you from doing the things that you need or want to do. Cancer rehabilitation can help. Our goal is to reduce these troubling effects and help you have the best quality of life possible.  You may receive a survey from a nurse that asks questions about your current state of health.  Based on the survey results, all eligible patients will be referred to the [facility] program for an evaluation so we can better serve you!  A frequently asked questions sheet is available upon request.

## 2014-06-26 ENCOUNTER — Encounter (HOSPITAL_COMMUNITY): Payer: Medicare Other | Attending: Hematology and Oncology

## 2014-06-26 DIAGNOSIS — D509 Iron deficiency anemia, unspecified: Secondary | ICD-10-CM | POA: Diagnosis not present

## 2014-06-26 DIAGNOSIS — D696 Thrombocytopenia, unspecified: Secondary | ICD-10-CM | POA: Insufficient documentation

## 2014-06-26 DIAGNOSIS — K7460 Unspecified cirrhosis of liver: Secondary | ICD-10-CM | POA: Diagnosis not present

## 2014-06-26 LAB — CBC WITH DIFFERENTIAL/PLATELET
BASOS PCT: 1 % (ref 0–1)
Basophils Absolute: 0 10*3/uL (ref 0.0–0.1)
Eosinophils Absolute: 0.2 10*3/uL (ref 0.0–0.7)
Eosinophils Relative: 4 % (ref 0–5)
HCT: 35.3 % — ABNORMAL LOW (ref 39.0–52.0)
Hemoglobin: 11.2 g/dL — ABNORMAL LOW (ref 13.0–17.0)
LYMPHS ABS: 0.8 10*3/uL (ref 0.7–4.0)
Lymphocytes Relative: 21 % (ref 12–46)
MCH: 32.6 pg (ref 26.0–34.0)
MCHC: 31.7 g/dL (ref 30.0–36.0)
MCV: 102.6 fL — ABNORMAL HIGH (ref 78.0–100.0)
MONO ABS: 0.3 10*3/uL (ref 0.1–1.0)
Monocytes Relative: 7 % (ref 3–12)
NEUTROS ABS: 2.7 10*3/uL (ref 1.7–7.7)
Neutrophils Relative %: 67 % (ref 43–77)
Platelets: 109 10*3/uL — ABNORMAL LOW (ref 150–400)
RBC: 3.44 MIL/uL — AB (ref 4.22–5.81)
RDW: 14.3 % (ref 11.5–15.5)
WBC: 4 10*3/uL (ref 4.0–10.5)

## 2014-06-26 LAB — IRON AND TIBC
Iron: 68 ug/dL (ref 42–165)
SATURATION RATIOS: 17 % — AB (ref 20–55)
TIBC: 411 ug/dL (ref 215–435)
UIBC: 343 ug/dL (ref 125–400)

## 2014-06-26 LAB — FERRITIN: Ferritin: 64 ng/mL (ref 22–322)

## 2014-06-26 NOTE — Progress Notes (Signed)
Lab draw

## 2014-06-27 ENCOUNTER — Other Ambulatory Visit (HOSPITAL_COMMUNITY): Payer: Self-pay | Admitting: Oncology

## 2014-06-27 DIAGNOSIS — D509 Iron deficiency anemia, unspecified: Secondary | ICD-10-CM

## 2014-06-27 NOTE — Progress Notes (Signed)
Message left for patient to call clinic to schedule appointment

## 2014-07-03 ENCOUNTER — Ambulatory Visit (INDEPENDENT_AMBULATORY_CARE_PROVIDER_SITE_OTHER): Payer: Medicare Other | Admitting: Gastroenterology

## 2014-07-03 ENCOUNTER — Encounter: Payer: Self-pay | Admitting: Gastroenterology

## 2014-07-03 ENCOUNTER — Ambulatory Visit: Payer: Medicare Other | Admitting: Gastroenterology

## 2014-07-03 ENCOUNTER — Other Ambulatory Visit: Payer: Self-pay

## 2014-07-03 VITALS — BP 110/69 | HR 95 | Temp 97.5°F | Ht 69.0 in | Wt 146.6 lb

## 2014-07-03 DIAGNOSIS — K7460 Unspecified cirrhosis of liver: Secondary | ICD-10-CM

## 2014-07-03 DIAGNOSIS — R1012 Left upper quadrant pain: Secondary | ICD-10-CM

## 2014-07-03 DIAGNOSIS — K7581 Nonalcoholic steatohepatitis (NASH): Secondary | ICD-10-CM

## 2014-07-03 NOTE — Patient Instructions (Signed)
1. No change in your medications at this time. 2. Please call if you have recurrent pain in the left upper abdomen. We would consider imaging if you have recurrent symptoms. Otherwise we will plan on routine abdominal ultrasound in March along with blood work.  3. Return to office in 08/2014.

## 2014-07-03 NOTE — Assessment & Plan Note (Addendum)
Has been stable. Will be due for labs in March along with MRI of liver/abdomen for hepatoma screening and follow-up of pancreatic lesion. Remains on nonselective beta blocker. Most recent EGD 2015 as outlined. Weight is been stable. Abdominal pain somewhat improved off of oral iron therapy. If he has recurrent left upper quadrant pain he will let us know and consider repeat imaging at that time. Return to see Dr. [name] in 3 months.

## 2014-07-03 NOTE — Progress Notes (Signed)
Primary Care Physician: [name], MD  Primary Gastroenterologist:  Brad Cornea, MD   Chief Complaint  Patient presents with  . Gastrophageal Reflux  . Other    NASH    HPI: Brad Wall is a 69 y.o. male here for follow-up visit. He was seen back in October 2015 for follow-up of Brad Wall cirrhosis, diverticulitis. History of hepatic encephalopathy in the past. Cannot afford Xifaxan. On inderal for esophageal varices. Last EGD July 2015 grade 1 esophageal varices at that time. Gastric erosions with benign biopsies. Felt to be related to iron pills. KOH prep consistent with esophageal candidiasis. Last colonoscopy September 2013 he had diverticulosis and polyps, next colonoscopy August 2018.  I saw the patient back in September for left lower quadrant pain as an urgent office visit. Treated empirically with Cipro and Flagyl for diverticulitis. A week later was seen in the emergency department with worsening abdominal pain but more right upper quadrant region. CT scan performed at that time showed no acute findings. Previously seen pancreatic lesion not apparent on CT scan. Patient had also had a large [place] meal prior to developing epigastric right upper quadrant pain which led to the ED visit. Patient was doing well back in October when he was seen by Dr. Gala Wall.   Overall he's been doing fairly well since he was last seen. He had been having some left upper quadrant discomfort which occurred almost daily for several weeks in a row but none in the past 3 weeks. Seems to be better since he stopped taking oral iron at the recommendation of hematology he was concerned about GI upset related to the medication. Patient currently is scheduled to have IV iron infusions upcoming. His appetite is good at this time. His weight has been stable since September. Bowel movements occur 2-3 times per day and are soft with lactulose. Denies any blood in the stool or melena. Has been having a lot of  issues with migraines and has an upcoming appointment with a neurologist in March. Also has been having more radicular pain into the right leg and plans to see his PCP for this.   Current Outpatient Prescriptions  Medication Sig Dispense Refill  . aspirin EC 81 MG tablet Take 81 mg by mouth every morning.     . B-D INS SYRINGE 0.5CC/31GX5/16 31G X 5/16" 0.5 ML MISC     . butalbital-acetaminophen-caffeine (FIORICET WITH CODEINE) 50-325-40-30 MG per capsule Take 1 capsule by mouth every 4 (four) hours as needed for headache.    . citalopram (CELEXA) 40 MG tablet Take 40 mg by mouth at bedtime.     . cyclobenzaprine (FLEXERIL) 10 MG tablet Take 10 mg by mouth at bedtime.     Marland Kitchen esomeprazole (NEXIUM) 40 MG capsule Take 40 mg by mouth 2 (two) times daily before a meal.    . gemfibrozil (LOPID) 600 MG tablet Take 600 mg by mouth 2 (two) times daily before a meal.      . insulin regular (NOVOLIN R,HUMULIN R) 100 units/mL injection Inject 3-7 Units into the skin 3 (three) times daily before meals. Given according to sliding scale at home.    . lactulose (CHRONULAC) 10 GM/15ML solution TAKE 30 MLS BY MOUTH TWICE DAILY. (Patient taking differently: TAKE 20 MLS BY MOUTH TWICE DAILY.) 1892 mL 5  . LORazepam (ATIVAN) 1 MG tablet Take 1 mg by mouth at bedtime as needed. Only takes for extreme anxiety or if unable to go to sleep    .  metFORMIN (GLUCOPHAGE) 500 MG tablet Take 1,000 mg by mouth 2 (two) times daily with a meal.    . Multiple Vitamin (MULTIVITAMIN WITH MINERALS) TABS tablet Take 1 tablet by mouth daily.    . nortriptyline (PAMELOR) 50 MG capsule Take 50 mg by mouth at bedtime.    Derrill Memo ON 07/06/2014] Oxycodone HCl 20 MG TABS Take 20 each by mouth.    . Probiotic Product (PROBIOTIC DAILY PO) Take 1 tablet by mouth daily.     . propranolol (INDERAL) 20 MG tablet Take 20 mg by mouth 2 (two) times daily.    . Pseudoephedrine-Acetaminophen (DAYTIME SINUS RELIEF PO) Take 1 capsule by mouth daily as  needed (for sinus relief).    . rosuvastatin (CRESTOR) 10 MG tablet Take 10 mg by mouth daily.     . Tamsulosin HCl (FLOMAX) 0.4 MG CAPS Take 0.4 mg by mouth 2 (two) times daily.    Marland Kitchen topiramate (TOPAMAX) 100 MG tablet Take 100 mg by mouth at bedtime.     . ondansetron (ZOFRAN-ODT) 8 MG disintegrating tablet Take 8 mg by mouth every 8 (eight) hours as needed for nausea or vomiting.      No current facility-administered medications for this visit.    Allergies as of 07/03/2014 - Review Complete 07/03/2014  Allergen Reaction Noted  . Penicillins Hives and Itching   . Lyrica [pregabalin] Other (See Comments) 02/04/2012  . Neurontin [gabapentin] Other (See Comments) 02/04/2012    ROS:  General: Negative for anorexia, weight loss, fever, chills, fatigue, weakness. ENT: Negative for hoarseness, difficulty swallowing , nasal congestion. CV: Negative for chest pain, angina, palpitations, dyspnea on exertion, peripheral edema.  Respiratory: Negative for dyspnea at rest, dyspnea on exertion, cough, sputum, wheezing.  GI: See history of present illness. GU:  Negative for dysuria, hematuria, urinary incontinence, urinary frequency, nocturnal urination.  Endo: Negative for unusual weight change.    Physical Examination:   BP 110/69 mmHg  Pulse 95  Temp(Src) 97.5 F (36.4 C)  Ht 5\' 9"  (1.753 m)  Wt 146 lb 9.6 oz (66.497 kg)  BMI 21.64 kg/m2  General: Well-nourished, well-developed in no acute distress.  Eyes: No icterus. Mouth: Oropharyngeal mucosa moist and pink , no lesions erythema or exudate. Lungs: Clear to auscultation bilaterally.  Heart: Regular rate and rhythm, no murmurs rubs or gallops.  Abdomen: Bowel sounds are normal, mild epigastric tenderness, nondistended, no hepatosplenomegaly or masses, no abdominal bruits or hernia , no rebound or guarding.   Extremities: No lower extremity edema. No clubbing or deformities. Neuro: Alert and oriented x 4   Skin: Warm and dry, no  jaundice.   Psych: Alert and cooperative, normal mood and affect.  Labs:  Lab Results  Component Value Date   WBC 4.0 06/26/2014   HGB 11.2* 06/26/2014   HCT 35.3* 06/26/2014   MCV 102.6* 06/26/2014   PLT 109* 06/26/2014   Lab Results  Component Value Date   IRON 68 06/26/2014   TIBC 411 06/26/2014   FERRITIN 64 06/26/2014   Lab Results  Component Value Date   CREATININE 1.15 03/21/2014   BUN 12 03/21/2014   NA 137 03/21/2014   K 4.2 03/21/2014   CL 102 03/21/2014   CO2 22 03/21/2014   Lab Results  Component Value Date   ALT 51 03/21/2014   AST 66* 03/21/2014   ALKPHOS 185* 03/21/2014   BILITOT 0.4 03/21/2014    Imaging Studies: No results found.

## 2014-07-04 NOTE — Progress Notes (Signed)
cc'ed to pcp °

## 2014-07-05 ENCOUNTER — Encounter (HOSPITAL_COMMUNITY): Payer: Medicare Other | Attending: Hematology and Oncology

## 2014-07-05 ENCOUNTER — Encounter (HOSPITAL_COMMUNITY): Payer: Self-pay

## 2014-07-05 VITALS — BP 95/61 | HR 87 | Temp 98.2°F | Resp 18

## 2014-07-05 DIAGNOSIS — D696 Thrombocytopenia, unspecified: Secondary | ICD-10-CM | POA: Insufficient documentation

## 2014-07-05 DIAGNOSIS — K7460 Unspecified cirrhosis of liver: Secondary | ICD-10-CM | POA: Insufficient documentation

## 2014-07-05 DIAGNOSIS — D509 Iron deficiency anemia, unspecified: Secondary | ICD-10-CM | POA: Insufficient documentation

## 2014-07-05 MED ORDER — SODIUM CHLORIDE 0.9 % IV SOLN
INTRAVENOUS | Status: DC
  Administered 2014-07-05: 11:00:00 via INTRAVENOUS

## 2014-07-05 MED ORDER — SODIUM CHLORIDE 0.9 % IV SOLN
510.0000 mg | Freq: Once | INTRAVENOUS | Status: AC
  Administered 2014-07-05: 510 mg via INTRAVENOUS
  Filled 2014-07-05: qty 17

## 2014-07-05 NOTE — Patient Instructions (Signed)
You had an iron infusion today.  Please return next week for your 2nd dose of iron.  Please call the clinic if you have any questions or concerns

## 2014-07-05 NOTE — Progress Notes (Signed)
Brad Wall Kady Tolerated iron infusion well today

## 2014-07-12 ENCOUNTER — Encounter (HOSPITAL_BASED_OUTPATIENT_CLINIC_OR_DEPARTMENT_OTHER): Payer: Medicare Other

## 2014-07-12 VITALS — BP 104/64 | HR 82 | Temp 98.2°F | Resp 16

## 2014-07-12 DIAGNOSIS — D509 Iron deficiency anemia, unspecified: Secondary | ICD-10-CM

## 2014-07-12 MED ORDER — SODIUM CHLORIDE 0.9 % IJ SOLN
10.0000 mL | Freq: Once | INTRAMUSCULAR | Status: AC
  Administered 2014-07-12: 10 mL via INTRAVENOUS

## 2014-07-12 MED ORDER — SODIUM CHLORIDE 0.9 % IV SOLN
510.0000 mg | Freq: Once | INTRAVENOUS | Status: AC
  Administered 2014-07-12: 510 mg via INTRAVENOUS
  Filled 2014-07-12: qty 17

## 2014-07-12 MED ORDER — SODIUM CHLORIDE 0.9 % IV SOLN
INTRAVENOUS | Status: DC
  Administered 2014-07-12: 11:00:00 via INTRAVENOUS

## 2014-07-12 NOTE — Progress Notes (Signed)
Tolerated iron infusion well. 

## 2014-07-12 NOTE — Patient Instructions (Signed)
[place] at [facility] Discharge Instructions  RECOMMENDATIONS MADE BY THE CONSULTANT AND ANY TEST RESULTS WILL BE SENT TO YOUR REFERRING PHYSICIAN.  Feraheme infusion #2 of 2 today. Return as scheduled.  Thank you for choosing [place] at [facility] to provide your oncology and hematology care.  To afford each patient quality time with our provider, please arrive at least 15 minutes before your scheduled appointment time.    You need to re-schedule your appointment should you arrive 10 or more minutes late.  We strive to give you quality time with our providers, and arriving late affects you and other patients whose appointments are after yours.  Also, if you no show three or more times for appointments you may be dismissed from the clinic at the providers discretion.     Again, thank you for choosing [facility].  Our hope is that these requests will decrease the amount of time that you wait before being seen by our physicians.       _____________________________________________________________  Should you have questions after your visit to [facility] [address], please contact our office at [phone] between the hours of 8:30 a.m. and 4:30 p.m.  Voicemails left after 4:30 p.m. will not be returned until the following business day.  For prescription refill requests, have your pharmacy contact our office.

## 2014-07-17 ENCOUNTER — Telehealth: Payer: Self-pay | Admitting: Internal Medicine

## 2014-07-17 NOTE — Telephone Encounter (Signed)
Pt is taking crestor and the pts wife saw on TV that you shouldn't take crestor if you have liver problems. She spoke with Dr.Knowlton yesterday and he switched him to atorvastatin. pts wife wants to know if its ok for him to finish the crestor before he starts the atorvastatin? And should he take the atorvastatin? Is this going to hurt his liver more?

## 2014-07-17 NOTE — Telephone Encounter (Signed)
Tyler Deis (wife of patient) called asking to speak with the nurse. I told her that JL was at lunch and I could transfer her call into JL VM. Pt agreed.

## 2014-07-17 NOTE — Telephone Encounter (Signed)
He can probably take either statin. Liver enzymes mildly elevated over time for other reasons. They need to continue to be monitored. If they do not spike,  he can safely continue on his statin. He should have his LFTs checked 3 weeks after starting a statin if not already been done. He does not necessarily need to stop Crestor

## 2014-07-17 NOTE — Telephone Encounter (Signed)
pts wife is aware. She said oncologist and pcp check his blood work frequently. Pt is going tomorrow morning for blood work for [employer].

## 2014-07-18 ENCOUNTER — Ambulatory Visit (HOSPITAL_COMMUNITY)
Admission: RE | Admit: 2014-07-18 | Discharge: 2014-07-18 | Disposition: A | Discharge status: Home / Self Care | Payer: Medicare Other | Source: Ambulatory Visit | Attending: Family Medicine | Admitting: Family Medicine

## 2014-07-18 ENCOUNTER — Other Ambulatory Visit (HOSPITAL_COMMUNITY): Payer: Self-pay | Admitting: Family Medicine

## 2014-07-18 DIAGNOSIS — M5442 Lumbago with sciatica, left side: Secondary | ICD-10-CM

## 2014-07-29 DIAGNOSIS — M5442 Lumbago with sciatica, left side: Secondary | ICD-10-CM | POA: Diagnosis not present

## 2014-07-29 DIAGNOSIS — M5137 Other intervertebral disc degeneration, lumbosacral region: Secondary | ICD-10-CM | POA: Diagnosis not present

## 2014-07-29 DIAGNOSIS — G894 Chronic pain syndrome: Secondary | ICD-10-CM | POA: Diagnosis not present

## 2014-07-29 DIAGNOSIS — M4317 Spondylolisthesis, lumbosacral region: Secondary | ICD-10-CM | POA: Diagnosis not present

## 2014-07-29 DIAGNOSIS — G43101 Migraine with aura, not intractable, with status migrainosus: Secondary | ICD-10-CM | POA: Diagnosis not present

## 2014-07-29 DIAGNOSIS — R1010 Upper abdominal pain, unspecified: Secondary | ICD-10-CM | POA: Diagnosis not present

## 2014-08-02 ENCOUNTER — Other Ambulatory Visit (HOSPITAL_COMMUNITY): Payer: Self-pay | Admitting: Family Medicine

## 2014-08-02 DIAGNOSIS — M545 Low back pain: Secondary | ICD-10-CM

## 2014-08-06 ENCOUNTER — Other Ambulatory Visit: Payer: Self-pay | Admitting: Family Medicine

## 2014-08-06 DIAGNOSIS — M545 Low back pain: Secondary | ICD-10-CM

## 2014-08-07 ENCOUNTER — Encounter (HOSPITAL_COMMUNITY): Payer: Medicare Other | Attending: Hematology & Oncology

## 2014-08-07 DIAGNOSIS — D7589 Other specified diseases of blood and blood-forming organs: Secondary | ICD-10-CM | POA: Insufficient documentation

## 2014-08-07 DIAGNOSIS — K7581 Nonalcoholic steatohepatitis (NASH): Secondary | ICD-10-CM | POA: Diagnosis not present

## 2014-08-07 DIAGNOSIS — D696 Thrombocytopenia, unspecified: Secondary | ICD-10-CM | POA: Insufficient documentation

## 2014-08-07 DIAGNOSIS — R1114 Bilious vomiting: Secondary | ICD-10-CM | POA: Diagnosis not present

## 2014-08-07 DIAGNOSIS — G8929 Other chronic pain: Secondary | ICD-10-CM | POA: Diagnosis not present

## 2014-08-07 DIAGNOSIS — D509 Iron deficiency anemia, unspecified: Secondary | ICD-10-CM | POA: Diagnosis present

## 2014-08-07 LAB — CBC WITH DIFFERENTIAL/PLATELET
BASOS ABS: 0 10*3/uL (ref 0.0–0.1)
BASOS PCT: 1 % (ref 0–1)
EOS ABS: 0.1 10*3/uL (ref 0.0–0.7)
EOS PCT: 4 % (ref 0–5)
HCT: 33.6 % — ABNORMAL LOW (ref 39.0–52.0)
Hemoglobin: 10.7 g/dL — ABNORMAL LOW (ref 13.0–17.0)
Lymphocytes Relative: 22 % (ref 12–46)
Lymphs Abs: 0.8 10*3/uL (ref 0.7–4.0)
MCH: 32.9 pg (ref 26.0–34.0)
MCHC: 31.8 g/dL (ref 30.0–36.0)
MCV: 103.4 fL — AB (ref 78.0–100.0)
MONO ABS: 0.2 10*3/uL (ref 0.1–1.0)
MONOS PCT: 7 % (ref 3–12)
Neutro Abs: 2.3 10*3/uL (ref 1.7–7.7)
Neutrophils Relative %: 66 % (ref 43–77)
Platelets: 96 10*3/uL — ABNORMAL LOW (ref 150–400)
RBC: 3.25 MIL/uL — ABNORMAL LOW (ref 4.22–5.81)
RDW: 14.6 % (ref 11.5–15.5)
WBC: 3.5 10*3/uL — ABNORMAL LOW (ref 4.0–10.5)

## 2014-08-07 LAB — IRON AND TIBC
IRON: 66 ug/dL (ref 42–165)
Saturation Ratios: 17 % — ABNORMAL LOW (ref 20–55)
TIBC: 390 ug/dL (ref 215–435)
UIBC: 324 ug/dL (ref 125–400)

## 2014-08-07 LAB — FERRITIN: Ferritin: 116 ng/mL (ref 22–322)

## 2014-08-07 NOTE — Progress Notes (Signed)
LABS DRAWN

## 2014-08-09 ENCOUNTER — Ambulatory Visit (HOSPITAL_COMMUNITY): Payer: Medicare Other | Admitting: Oncology

## 2014-08-09 ENCOUNTER — Encounter (HOSPITAL_COMMUNITY): Payer: Self-pay | Admitting: Oncology

## 2014-08-09 DIAGNOSIS — G8929 Other chronic pain: Secondary | ICD-10-CM | POA: Insufficient documentation

## 2014-08-09 DIAGNOSIS — D7589 Other specified diseases of blood and blood-forming organs: Secondary | ICD-10-CM | POA: Insufficient documentation

## 2014-08-09 HISTORY — DX: Other chronic pain: G89.29

## 2014-08-09 NOTE — Assessment & Plan Note (Addendum)
Iron deficiency requiring IV Feraheme periodically to maintain adequate iron stores to keep up his hemoglobin. Labs in 12 and 16 weeks: iron/TIBC, ferritin

## 2014-08-09 NOTE — Assessment & Plan Note (Addendum)
Thrombocytopenia, secondary to severe cirrhosis of liver with splenomegaly.  Labs in 6, 12, and 18 weeks: CBC diff.

## 2014-08-09 NOTE — Progress Notes (Signed)
-  Rescheduled-  Brad Wall  

## 2014-08-09 NOTE — Assessment & Plan Note (Signed)
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

## 2014-08-09 NOTE — Assessment & Plan Note (Signed)
New.  Will order B12 and Folate with next lab appointment in 6 weeks

## 2014-08-09 NOTE — Assessment & Plan Note (Signed)
Chronic pain on methadone and hydrocodone pills 4 times a day each with nice control. He is seen with the pain clinic specialist at [facility]

## 2014-08-12 ENCOUNTER — Ambulatory Visit (HOSPITAL_COMMUNITY): Payer: Medicare Other

## 2014-08-13 ENCOUNTER — Other Ambulatory Visit: Payer: Self-pay | Admitting: Gastroenterology

## 2014-08-19 ENCOUNTER — Other Ambulatory Visit: Payer: Self-pay

## 2014-08-19 DIAGNOSIS — K7460 Unspecified cirrhosis of liver: Secondary | ICD-10-CM

## 2014-08-21 ENCOUNTER — Ambulatory Visit
Admission: RE | Admit: 2014-08-21 | Discharge: 2014-08-21 | Disposition: A | Discharge status: Home / Self Care | Payer: Medicare Other | Source: Ambulatory Visit | Attending: Family Medicine | Admitting: Family Medicine

## 2014-08-21 DIAGNOSIS — M545 Low back pain: Secondary | ICD-10-CM

## 2014-08-22 ENCOUNTER — Encounter (HOSPITAL_BASED_OUTPATIENT_CLINIC_OR_DEPARTMENT_OTHER): Payer: Medicare Other | Admitting: Oncology

## 2014-08-22 ENCOUNTER — Encounter (HOSPITAL_COMMUNITY): Payer: Self-pay | Admitting: Oncology

## 2014-08-22 VITALS — BP 116/71 | HR 88 | Resp 16 | Wt 145.7 lb

## 2014-08-22 DIAGNOSIS — D696 Thrombocytopenia, unspecified: Secondary | ICD-10-CM

## 2014-08-22 DIAGNOSIS — K7460 Unspecified cirrhosis of liver: Secondary | ICD-10-CM

## 2014-08-22 DIAGNOSIS — D7589 Other specified diseases of blood and blood-forming organs: Secondary | ICD-10-CM

## 2014-08-22 DIAGNOSIS — G8929 Other chronic pain: Secondary | ICD-10-CM

## 2014-08-22 DIAGNOSIS — D509 Iron deficiency anemia, unspecified: Secondary | ICD-10-CM

## 2014-08-22 DIAGNOSIS — D6959 Other secondary thrombocytopenia: Secondary | ICD-10-CM

## 2014-08-22 DIAGNOSIS — K7581 Nonalcoholic steatohepatitis (NASH): Secondary | ICD-10-CM

## 2014-08-22 DIAGNOSIS — R1114 Bilious vomiting: Secondary | ICD-10-CM

## 2014-08-22 MED ORDER — PROCHLORPERAZINE MALEATE 10 MG PO TABS
10.0000 mg | ORAL_TABLET | Freq: Four times a day (QID) | ORAL | Status: DC | PRN
  Administered 2014-08-22: 10 mg via ORAL
  Filled 2014-08-22: qty 1

## 2014-08-22 MED ORDER — PROCHLORPERAZINE MALEATE 10 MG PO TABS: 10.0000 mg | ORAL_TABLET | Freq: Four times a day (QID) | ORAL | Status: DC | PRN

## 2014-08-22 NOTE — Assessment & Plan Note (Addendum)
Chronic pain on methadone and hydrocodone pills 4 times a day each with nice control usually. He is seen with the pain clinic specialist at [facility].  He reports exacerbation of low back pain and RUQ pain.  He is seen walking with a cane and with tears in his eyes.  He underwent an MRI of back recently by Dr. Karie Kirks and I encouraged the patient to follow-up with Dr. Karie Kirks.
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
## 2014-08-22 NOTE — Assessment & Plan Note (Signed)
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

## 2014-08-22 NOTE — Assessment & Plan Note (Addendum)
Thrombocytopenia, secondary to severe cirrhosis of liver with splenomegaly.  Labs as described above.

## 2014-08-22 NOTE — Patient Instructions (Signed)
[place] at [facility] Discharge Instructions  RECOMMENDATIONS MADE BY THE CONSULTANT AND ANY TEST RESULTS WILL BE SENT TO YOUR REFERRING PHYSICIAN.  Exam and discussion by Robynn Pane, PA-C. Will give you some compazine while you are here to help you with your nausea and will send a prescription to your pharmacy.  You can also use the ativan for nausea as well.  Labs every 6 weeks and office visit after labs in 3 months.  Thank you for choosing [place] at [facility] to provide your oncology and hematology care.  To afford each patient quality time with our provider, please arrive at least 15 minutes before your scheduled appointment time.    You need to re-schedule your appointment should you arrive 10 or more minutes late.  We strive to give you quality time with our providers, and arriving late affects you and other patients whose appointments are after yours.  Also, if you no show three or more times for appointments you may be dismissed from the clinic at the providers discretion.     Again, thank you for choosing [facility].  Our hope is that these requests will decrease the amount of time that you wait before being seen by our physicians.       _____________________________________________________________  Should you have questions after your visit to [facility], please contact our office at [phone] between the hours of 8:30 a.m. and 4:30 p.m.  Voicemails left after 4:30 p.m. will not be returned until the following business day.  For prescription refill requests, have your pharmacy contact our office.

## 2014-08-22 NOTE — Assessment & Plan Note (Signed)
Etiology unclear and not hematologically related.  Reports Zofran in past with minimal effectiveness.  Recommended Ativan PO or SL every 6 hours PRN nausea and/or vomiting.  Rx for Compazine 10 mg every 6 hours PRN nausea/vomiting.  Recommend follow-up with GI.

## 2014-08-22 NOTE — Assessment & Plan Note (Signed)
New.  Will order B12 and Folate with next lab appointment in 6 weeks

## 2014-08-22 NOTE — Assessment & Plan Note (Addendum)
Iron deficiency requiring IV Feraheme periodically to maintain adequate iron stores to keep up his hemoglobin. Labs in 4, 10, and 14 weeks: CBC diff, iron/TIBC, ferritin

## 2014-08-29 ENCOUNTER — Telehealth: Payer: Self-pay | Admitting: Internal Medicine

## 2014-08-29 NOTE — Telephone Encounter (Signed)
Patient on recall for liver mri march /abd hcc screening

## 2014-08-29 NOTE — Telephone Encounter (Signed)
Mailed reminder letter

## 2014-09-02 ENCOUNTER — Telehealth: Payer: Self-pay

## 2014-09-02 ENCOUNTER — Emergency Department (HOSPITAL_COMMUNITY): Payer: Medicare Other

## 2014-09-02 ENCOUNTER — Other Ambulatory Visit: Payer: Self-pay

## 2014-09-02 ENCOUNTER — Encounter (HOSPITAL_COMMUNITY): Payer: Self-pay | Admitting: *Deleted

## 2014-09-02 ENCOUNTER — Emergency Department (HOSPITAL_COMMUNITY)
Admission: EM | Admit: 2014-09-02 | Discharge: 2014-09-02 | Discharge status: Against Medical Advice | Payer: Medicare Other | Attending: Emergency Medicine | Admitting: Emergency Medicine

## 2014-09-02 DIAGNOSIS — I10 Essential (primary) hypertension: Secondary | ICD-10-CM | POA: Diagnosis not present

## 2014-09-02 DIAGNOSIS — G8929 Other chronic pain: Secondary | ICD-10-CM | POA: Insufficient documentation

## 2014-09-02 DIAGNOSIS — K7460 Unspecified cirrhosis of liver: Secondary | ICD-10-CM

## 2014-09-02 DIAGNOSIS — R51 Headache: Secondary | ICD-10-CM | POA: Insufficient documentation

## 2014-09-02 DIAGNOSIS — E119 Type 2 diabetes mellitus without complications: Secondary | ICD-10-CM | POA: Insufficient documentation

## 2014-09-02 DIAGNOSIS — R1012 Left upper quadrant pain: Secondary | ICD-10-CM

## 2014-09-02 DIAGNOSIS — R0789 Other chest pain: Secondary | ICD-10-CM | POA: Diagnosis not present

## 2014-09-02 DIAGNOSIS — K7581 Nonalcoholic steatohepatitis (NASH): Principal | ICD-10-CM

## 2014-09-02 NOTE — ED Notes (Signed)
Pt wanting to leave. Signed AMA form & carried by wheelchair to the front.

## 2014-09-02 NOTE — ED Notes (Signed)
Chest pain, onset app 6 30p,  headache

## 2014-09-02 NOTE — Telephone Encounter (Signed)
Pt called to set up MRI. The PA# [id number]. Wife will call to schedule appointment since he goes to the imaging center in [place].

## 2014-09-03 NOTE — Telephone Encounter (Signed)
noted 

## 2014-09-05 LAB — CBC WITH DIFFERENTIAL/PLATELET
BASOS PCT: 1 % (ref 0–1)
Basophils Absolute: 0 10*3/uL (ref 0.0–0.1)
EOS ABS: 0.2 10*3/uL (ref 0.0–0.7)
EOS PCT: 4 % (ref 0–5)
HCT: 31 % — ABNORMAL LOW (ref 39.0–52.0)
Hemoglobin: 9.7 g/dL — ABNORMAL LOW (ref 13.0–17.0)
LYMPHS ABS: 0.8 10*3/uL (ref 0.7–4.0)
Lymphocytes Relative: 21 % (ref 12–46)
MCH: 30.3 pg (ref 26.0–34.0)
MCHC: 31.3 g/dL (ref 30.0–36.0)
MCV: 96.9 fL (ref 78.0–100.0)
MONOS PCT: 10 % (ref 3–12)
MPV: 12.2 fL (ref 8.6–12.4)
Monocytes Absolute: 0.4 10*3/uL (ref 0.1–1.0)
NEUTROS ABS: 2.5 10*3/uL (ref 1.7–7.7)
NEUTROS PCT: 64 % (ref 43–77)
Platelets: 156 10*3/uL (ref 150–400)
RBC: 3.2 MIL/uL — ABNORMAL LOW (ref 4.22–5.81)
RDW: 15.1 % (ref 11.5–15.5)
WBC: 3.9 10*3/uL — ABNORMAL LOW (ref 4.0–10.5)

## 2014-09-05 LAB — PROTIME-INR
INR: 1.13 (ref <1.50)
PROTHROMBIN TIME: 14.5 s (ref 11.6–15.2)

## 2014-09-05 LAB — COMPREHENSIVE METABOLIC PANEL
ALK PHOS: 152 U/L — AB (ref 39–117)
ALT: 24 U/L (ref 0–53)
AST: 33 U/L (ref 0–37)
Albumin: 4.9 g/dL (ref 3.5–5.2)
BUN: 15 mg/dL (ref 6–23)
CHLORIDE: 102 meq/L (ref 96–112)
CO2: 23 mEq/L (ref 19–32)
Calcium: 9.7 mg/dL (ref 8.4–10.5)
Creat: 0.96 mg/dL (ref 0.50–1.35)
Glucose, Bld: 75 mg/dL (ref 70–99)
POTASSIUM: 5.3 meq/L (ref 3.5–5.3)
Sodium: 136 mEq/L (ref 135–145)
Total Bilirubin: 0.4 mg/dL (ref 0.2–1.2)
Total Protein: 7.2 g/dL (ref 6.0–8.3)

## 2014-09-05 NOTE — Progress Notes (Signed)
Quick Note:  MELD 4. Hgb trending downward over the past few months. Other labs stable. Let's make sure he is not having obvious gi bleeding or melena. Repeat CBC in 3 weeks.  MRI as planned. OV with RMR 09/2014. Please make appt. ______

## 2014-09-10 ENCOUNTER — Encounter: Payer: Self-pay | Admitting: Internal Medicine

## 2014-09-11 ENCOUNTER — Other Ambulatory Visit: Payer: Self-pay

## 2014-09-11 DIAGNOSIS — D509 Iron deficiency anemia, unspecified: Secondary | ICD-10-CM

## 2014-09-17 ENCOUNTER — Telehealth: Payer: Self-pay | Admitting: Internal Medicine

## 2014-09-17 NOTE — Telephone Encounter (Signed)
PATIENT WIFE CALLED AND WAS TOLD TO LET OFFICE KNOW OF ANY BLEEDING THE PATIENT WAS HAVING.  YESTERDAY THE PATIENT GOT A VERY BAD NOSE BLEED WITH CLOTTING.  IS THIS SOMETHING THE OFFICE NEEDS TO BE AWARE OF?  PLEASE CALL WIFE BACK.

## 2014-09-18 ENCOUNTER — Encounter (HOSPITAL_COMMUNITY): Payer: Medicare Other | Attending: Hematology & Oncology

## 2014-09-18 DIAGNOSIS — K7581 Nonalcoholic steatohepatitis (NASH): Secondary | ICD-10-CM | POA: Diagnosis not present

## 2014-09-18 DIAGNOSIS — R1114 Bilious vomiting: Secondary | ICD-10-CM | POA: Diagnosis not present

## 2014-09-18 DIAGNOSIS — G8929 Other chronic pain: Secondary | ICD-10-CM | POA: Insufficient documentation

## 2014-09-18 DIAGNOSIS — D7589 Other specified diseases of blood and blood-forming organs: Secondary | ICD-10-CM | POA: Diagnosis not present

## 2014-09-18 DIAGNOSIS — D509 Iron deficiency anemia, unspecified: Secondary | ICD-10-CM | POA: Diagnosis present

## 2014-09-18 DIAGNOSIS — D696 Thrombocytopenia, unspecified: Secondary | ICD-10-CM | POA: Diagnosis not present

## 2014-09-18 LAB — CBC WITH DIFFERENTIAL/PLATELET
BASOS ABS: 0 10*3/uL (ref 0.0–0.1)
Basophils Relative: 1 % (ref 0–1)
EOS PCT: 3 % (ref 0–5)
Eosinophils Absolute: 0.1 10*3/uL (ref 0.0–0.7)
HCT: 26.5 % — ABNORMAL LOW (ref 39.0–52.0)
Hemoglobin: 8.4 g/dL — ABNORMAL LOW (ref 13.0–17.0)
Lymphocytes Relative: 18 % (ref 12–46)
Lymphs Abs: 0.7 10*3/uL (ref 0.7–4.0)
MCH: 29.3 pg (ref 26.0–34.0)
MCHC: 31.7 g/dL (ref 30.0–36.0)
MCV: 92.3 fL (ref 78.0–100.0)
Monocytes Absolute: 0.4 10*3/uL (ref 0.1–1.0)
Monocytes Relative: 9 % (ref 3–12)
NEUTROS PCT: 69 % (ref 43–77)
Neutro Abs: 2.6 10*3/uL (ref 1.7–7.7)
Platelets: 111 10*3/uL — ABNORMAL LOW (ref 150–400)
RBC: 2.87 MIL/uL — ABNORMAL LOW (ref 4.22–5.81)
RDW: 14.9 % (ref 11.5–15.5)
WBC: 3.8 10*3/uL — AB (ref 4.0–10.5)

## 2014-09-18 NOTE — Progress Notes (Signed)
Labs drawn

## 2014-09-18 NOTE — Telephone Encounter (Signed)
Dr.Rourk- do we need this information or do they need to call their pcp?

## 2014-09-18 NOTE — Telephone Encounter (Signed)
Tried to call- NA- LMOM with details. Asked her to call me back if they have any questions.

## 2014-09-18 NOTE — Telephone Encounter (Signed)
This is not a GI issue. Could start with PCP if ongoing-to the ER. May need to see the ENT doctor. However, would start with PCP

## 2014-09-19 ENCOUNTER — Other Ambulatory Visit (HOSPITAL_COMMUNITY): Payer: Medicare Other

## 2014-09-19 ENCOUNTER — Other Ambulatory Visit (HOSPITAL_COMMUNITY): Payer: Self-pay | Admitting: Oncology

## 2014-09-19 DIAGNOSIS — E538 Deficiency of other specified B group vitamins: Secondary | ICD-10-CM

## 2014-09-19 DIAGNOSIS — D509 Iron deficiency anemia, unspecified: Secondary | ICD-10-CM

## 2014-09-19 LAB — IRON AND TIBC
Iron: 27 ug/dL — ABNORMAL LOW (ref 42–165)
SATURATION RATIOS: 6 % — AB (ref 20–55)
TIBC: 450 ug/dL — ABNORMAL HIGH (ref 215–435)
UIBC: 423 ug/dL — ABNORMAL HIGH (ref 125–400)

## 2014-09-19 LAB — FERRITIN: FERRITIN: 10 ng/mL — AB (ref 22–322)

## 2014-09-19 LAB — VITAMIN B12: VITAMIN B 12: 391 pg/mL (ref 211–911)

## 2014-09-19 LAB — FOLATE: Folate: 1 ng/mL — ABNORMAL LOW

## 2014-09-19 MED ORDER — FOLIC ACID 1 MG PO TABS: 1.0000 mg | ORAL_TABLET | Freq: Every day | ORAL | Status: DC

## 2014-09-25 ENCOUNTER — Encounter (HOSPITAL_BASED_OUTPATIENT_CLINIC_OR_DEPARTMENT_OTHER): Payer: Medicare Other

## 2014-09-25 ENCOUNTER — Encounter (HOSPITAL_COMMUNITY): Payer: Self-pay

## 2014-09-25 VITALS — BP 100/54 | HR 84 | Temp 98.3°F | Resp 18 | Wt 152.4 lb

## 2014-09-25 DIAGNOSIS — D509 Iron deficiency anemia, unspecified: Secondary | ICD-10-CM | POA: Diagnosis not present

## 2014-09-25 MED ORDER — SODIUM CHLORIDE 0.9 % IV SOLN: 510.0000 mg | Freq: Once | INTRAVENOUS | Status: DC

## 2014-09-25 MED ORDER — SODIUM CHLORIDE 0.9 % IV SOLN
510.0000 mg | Freq: Once | INTRAVENOUS | Status: AC
  Administered 2014-09-25: 510 mg via INTRAVENOUS
  Filled 2014-09-25: qty 17

## 2014-09-25 NOTE — Patient Instructions (Signed)
[place] at [facility] Discharge Instructions  RECOMMENDATIONS MADE BY THE CONSULTANT AND ANY TEST RESULTS WILL BE SENT TO YOUR REFERRING PHYSICIAN.  Today you received iron infusion. Return as scheduled for your second infusion next week.  Thank you for choosing [place] at [facility] to provide your oncology and hematology care.  To afford each patient quality time with our provider, please arrive at least 15 minutes before your scheduled appointment time.    You need to re-schedule your appointment should you arrive 10 or more minutes late.  We strive to give you quality time with our providers, and arriving late affects you and other patients whose appointments are after yours.  Also, if you no show three or more times for appointments you may be dismissed from the clinic at the providers discretion.     Again, thank you for choosing [facility].  Our hope is that these requests will decrease the amount of time that you wait before being seen by our physicians.       _____________________________________________________________  Should you have questions after your visit to [facility], please contact our office at [phone] between the hours of 8:30 a.m. and 4:30 p.m.  Voicemails left after 4:30 p.m. will not be returned until the following business day.  For prescription refill requests, have your pharmacy contact our office.

## 2014-09-26 ENCOUNTER — Telehealth: Payer: Self-pay

## 2014-09-26 ENCOUNTER — Ambulatory Visit
Admission: RE | Admit: 2014-09-26 | Discharge: 2014-09-26 | Disposition: A | Discharge status: Home / Self Care | Payer: Medicare Other | Source: Ambulatory Visit | Attending: Internal Medicine | Admitting: Internal Medicine

## 2014-09-26 DIAGNOSIS — K7581 Nonalcoholic steatohepatitis (NASH): Principal | ICD-10-CM

## 2014-09-26 DIAGNOSIS — K7460 Unspecified cirrhosis of liver: Secondary | ICD-10-CM

## 2014-09-26 DIAGNOSIS — R1012 Left upper quadrant pain: Secondary | ICD-10-CM

## 2014-09-26 MED ORDER — GADOXETATE DISODIUM 0.25 MMOL/ML IV SOLN
7.0000 mL | Freq: Once | INTRAVENOUS | Status: AC | PRN
  Administered 2014-09-26: 7 mL via INTRAVENOUS

## 2014-09-26 NOTE — Telephone Encounter (Signed)
Discussed with Dr. Oneida Alar. Discussed with Dr. Abigail Miyamoto (radiologist).  Perihepatic and perisplenic fluid present before on previous MRI but now complex. Cannot exclude perihepatic hemorrhage. "Trace" amount seen. Not sure why the change in the nature of the fluid.    Instructions for patient per Dr. Oneida Alar recommendations. Tried to call patient LMOAM. Avoid trauma or injury to upper abdomen. If develops RUQ pain, dizziness he should call 911 and go straight to the nearest ER. Call or go to ER with fever.  Further instructions regarding nonacute MRI findings per Dr. [name]. No hepatoma.  Looks like he is developing iron deposits in liver and spleen.

## 2014-09-26 NOTE — Telephone Encounter (Signed)
Talked with patient and wife. Patient is on antibiotic (Bactrim) for 30 days for prostate infection.   Discussed MRI findings. Instructions provided as outlined. Chronic unchanged upper abdominal pain. No dizziness. No known injury to upper abdomen. He has had declining hemoglobin and receiving iron infusions. No melena, brbpr. Has had couple of nosebleeds over the past few weeks. Last for 15 minutes at a time, hard to stop and streaming down the face.    Patient has OV next week with Dr. [name].    They had questions about upcoming CBC. No need to have done, same planned with hematology.

## 2014-09-26 NOTE — Telephone Encounter (Signed)
T/C from [place] at [facility] said they were to call report on the MRI. She said she was not told any specifics except that it was call report. Dr. [name] ordered the MRI and his nurse, Almyra Free is away from her desk. The report is in the chart now, so I am sending the message to Neil Crouch, PA , in Dr. Roseanne Kaufman absence.

## 2014-09-27 NOTE — Telephone Encounter (Signed)
REVIEWED-NO ADDITIONAL RECOMMENDATIONS. 

## 2014-09-30 ENCOUNTER — Other Ambulatory Visit: Payer: Self-pay

## 2014-09-30 ENCOUNTER — Other Ambulatory Visit: Payer: Self-pay | Admitting: Internal Medicine

## 2014-09-30 DIAGNOSIS — K7581 Nonalcoholic steatohepatitis (NASH): Principal | ICD-10-CM

## 2014-09-30 DIAGNOSIS — K7460 Unspecified cirrhosis of liver: Secondary | ICD-10-CM

## 2014-10-02 ENCOUNTER — Encounter (HOSPITAL_COMMUNITY): Payer: Medicare Other | Attending: Hematology and Oncology

## 2014-10-02 VITALS — BP 98/58 | HR 85 | Temp 97.3°F | Resp 16

## 2014-10-02 DIAGNOSIS — D509 Iron deficiency anemia, unspecified: Secondary | ICD-10-CM | POA: Insufficient documentation

## 2014-10-02 DIAGNOSIS — D696 Thrombocytopenia, unspecified: Secondary | ICD-10-CM | POA: Insufficient documentation

## 2014-10-02 DIAGNOSIS — K7460 Unspecified cirrhosis of liver: Secondary | ICD-10-CM | POA: Insufficient documentation

## 2014-10-02 MED ORDER — SODIUM CHLORIDE 0.9 % IV SOLN
INTRAVENOUS | Status: DC
  Administered 2014-10-02: 13:00:00 via INTRAVENOUS

## 2014-10-02 MED ORDER — SODIUM CHLORIDE 0.9 % IJ SOLN
10.0000 mL | Freq: Once | INTRAMUSCULAR | Status: AC
  Administered 2014-10-02: 10 mL via INTRAVENOUS

## 2014-10-02 MED ORDER — SODIUM CHLORIDE 0.9 % IV SOLN
510.0000 mg | Freq: Once | INTRAVENOUS | Status: AC
  Administered 2014-10-02: 510 mg via INTRAVENOUS
  Filled 2014-10-02: qty 17

## 2014-10-02 NOTE — Patient Instructions (Signed)
[facility] at [facility] Discharge Instructions  RECOMMENDATIONS MADE BY THE CONSULTANT AND ANY TEST RESULTS WILL BE SENT TO YOUR REFERRING PHYSICIAN.  Iron infusion today as ordered. Return as scheduled.  Thank you for choosing [facility] at [facility] to provide your oncology and hematology care.  To afford each patient quality time with our provider, please arrive at least 15 minutes before your scheduled appointment time.    You need to re-schedule your appointment should you arrive 10 or more minutes late.  We strive to give you quality time with our providers, and arriving late affects you and other patients whose appointments are after yours.  Also, if you no show three or more times for appointments you may be dismissed from the clinic at the providers discretion.     Again, thank you for choosing [facility].  Our hope is that these requests will decrease the amount of time that you wait before being seen by our physicians.       _____________________________________________________________  Should you have questions after your visit to [facility], please contact our office at [phone] between the hours of 8:30 a.m. and 4:30 p.m.  Voicemails left after 4:30 p.m. will not be returned until the following business day.  For prescription refill requests, have your pharmacy contact our office.    

## 2014-10-02 NOTE — Progress Notes (Signed)
Tolerated iron infusion well. 

## 2014-10-04 ENCOUNTER — Telehealth: Payer: Self-pay

## 2014-10-04 ENCOUNTER — Other Ambulatory Visit: Payer: Self-pay | Admitting: Internal Medicine

## 2014-10-04 ENCOUNTER — Other Ambulatory Visit: Payer: Self-pay

## 2014-10-04 ENCOUNTER — Ambulatory Visit (INDEPENDENT_AMBULATORY_CARE_PROVIDER_SITE_OTHER): Payer: Medicare Other | Admitting: Internal Medicine

## 2014-10-04 ENCOUNTER — Encounter: Payer: Self-pay | Admitting: Internal Medicine

## 2014-10-04 VITALS — BP 105/66 | HR 92 | Temp 97.6°F | Ht 69.0 in | Wt 151.8 lb

## 2014-10-04 DIAGNOSIS — K7460 Unspecified cirrhosis of liver: Secondary | ICD-10-CM

## 2014-10-04 DIAGNOSIS — K219 Gastro-esophageal reflux disease without esophagitis: Secondary | ICD-10-CM

## 2014-10-04 DIAGNOSIS — R112 Nausea with vomiting, unspecified: Secondary | ICD-10-CM | POA: Diagnosis not present

## 2014-10-04 DIAGNOSIS — R1010 Upper abdominal pain, unspecified: Secondary | ICD-10-CM

## 2014-10-04 DIAGNOSIS — R188 Other ascites: Principal | ICD-10-CM

## 2014-10-04 LAB — CBC WITH DIFFERENTIAL/PLATELET
BASOS ABS: 0 10*3/uL (ref 0.0–0.1)
Basophils Relative: 1 % (ref 0–1)
EOS ABS: 0.1 10*3/uL (ref 0.0–0.7)
EOS PCT: 3 % (ref 0–5)
HCT: 26.9 % — ABNORMAL LOW (ref 39.0–52.0)
Hemoglobin: 8.4 g/dL — ABNORMAL LOW (ref 13.0–17.0)
LYMPHS PCT: 18 % (ref 12–46)
Lymphs Abs: 0.6 10*3/uL — ABNORMAL LOW (ref 0.7–4.0)
MCH: 28.9 pg (ref 26.0–34.0)
MCHC: 31.2 g/dL (ref 30.0–36.0)
MCV: 92.4 fL (ref 78.0–100.0)
MPV: 10.1 fL (ref 8.6–12.4)
Monocytes Absolute: 0.3 10*3/uL (ref 0.1–1.0)
Monocytes Relative: 9 % (ref 3–12)
Neutro Abs: 2.2 10*3/uL (ref 1.7–7.7)
Neutrophils Relative %: 69 % (ref 43–77)
PLATELETS: 96 10*3/uL — AB (ref 150–400)
RBC: 2.91 MIL/uL — ABNORMAL LOW (ref 4.22–5.81)
RDW: 19.9 % — ABNORMAL HIGH (ref 11.5–15.5)
WBC: 3.2 10*3/uL — ABNORMAL LOW (ref 4.0–10.5)

## 2014-10-04 MED ORDER — ESOMEPRAZOLE MAGNESIUM 20 MG PO CPDR: 20.0000 mg | DELAYED_RELEASE_CAPSULE | Freq: Two times a day (BID) | ORAL | Status: AC

## 2014-10-04 NOTE — Patient Instructions (Addendum)
Increase esomeprazole to 40 mg twice daily  Solid phase gastric emptying study (nausea and vomiting - diabetes)  Serum lipase  Avoid overexertion and even minor trauma to abdomen  Office visit in 1 month

## 2014-10-04 NOTE — Telephone Encounter (Signed)
I faxed release of records from [place] to be sent to [place].
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
## 2014-10-04 NOTE — Telephone Encounter (Signed)
rx sent to the pharmacy. Manuela Schwartz, we need records please.

## 2014-10-04 NOTE — Telephone Encounter (Signed)
Per RMR-  Please call in esomeprazole 40 mg. The patient is take take 40 mg twice daily. 6 month refills. [employer].         Need to see old records from the [facility] evaluation

## 2014-10-10 ENCOUNTER — Encounter (HOSPITAL_COMMUNITY)
Admission: RE | Admit: 2014-10-10 | Discharge: 2014-10-10 | Disposition: A | Discharge status: Home / Self Care | Payer: Medicare Other | Source: Ambulatory Visit | Attending: Internal Medicine | Admitting: Internal Medicine

## 2014-10-10 ENCOUNTER — Encounter (HOSPITAL_COMMUNITY): Payer: Self-pay

## 2014-10-10 DIAGNOSIS — R112 Nausea with vomiting, unspecified: Secondary | ICD-10-CM | POA: Diagnosis present

## 2014-10-10 MED ORDER — TECHNETIUM TC 99M SULFUR COLLOID
2.0000 | Freq: Once | INTRAVENOUS | Status: AC | PRN
  Administered 2014-10-10: 2.1 via ORAL

## 2014-10-11 ENCOUNTER — Ambulatory Visit: Payer: Medicare Other | Admitting: Internal Medicine

## 2014-11-01 ENCOUNTER — Encounter (HOSPITAL_COMMUNITY): Payer: Medicare Other | Attending: Hematology and Oncology

## 2014-11-01 ENCOUNTER — Other Ambulatory Visit (HOSPITAL_COMMUNITY): Payer: Self-pay | Admitting: Oncology

## 2014-11-01 DIAGNOSIS — D509 Iron deficiency anemia, unspecified: Secondary | ICD-10-CM

## 2014-11-01 DIAGNOSIS — D696 Thrombocytopenia, unspecified: Secondary | ICD-10-CM | POA: Insufficient documentation

## 2014-11-01 DIAGNOSIS — K7460 Unspecified cirrhosis of liver: Secondary | ICD-10-CM | POA: Insufficient documentation

## 2014-11-01 DIAGNOSIS — D7589 Other specified diseases of blood and blood-forming organs: Secondary | ICD-10-CM

## 2014-11-01 LAB — CBC WITH DIFFERENTIAL/PLATELET
BASOS ABS: 0 10*3/uL (ref 0.0–0.1)
Basophils Relative: 1 % (ref 0–1)
EOS ABS: 0.1 10*3/uL (ref 0.0–0.7)
EOS PCT: 3 % (ref 0–5)
HCT: 30.1 % — ABNORMAL LOW (ref 39.0–52.0)
Hemoglobin: 9.6 g/dL — ABNORMAL LOW (ref 13.0–17.0)
LYMPHS ABS: 0.6 10*3/uL — AB (ref 0.7–4.0)
LYMPHS PCT: 17 % (ref 12–46)
MCH: 30.9 pg (ref 26.0–34.0)
MCHC: 31.9 g/dL (ref 30.0–36.0)
MCV: 96.8 fL (ref 78.0–100.0)
Monocytes Absolute: 0.3 10*3/uL (ref 0.1–1.0)
Monocytes Relative: 7 % (ref 3–12)
NEUTROS ABS: 2.5 10*3/uL (ref 1.7–7.7)
Neutrophils Relative %: 72 % (ref 43–77)
Platelets: 95 10*3/uL — ABNORMAL LOW (ref 150–400)
RBC: 3.11 MIL/uL — ABNORMAL LOW (ref 4.22–5.81)
RDW: 18.7 % — ABNORMAL HIGH (ref 11.5–15.5)
WBC: 3.4 10*3/uL — AB (ref 4.0–10.5)

## 2014-11-01 LAB — IRON AND TIBC
Iron: 45 ug/dL (ref 45–182)
Saturation Ratios: 11 % — ABNORMAL LOW (ref 17.9–39.5)
TIBC: 417 ug/dL (ref 250–450)
UIBC: 372 ug/dL

## 2014-11-01 LAB — FERRITIN: FERRITIN: 24 ng/mL (ref 24–336)

## 2014-11-01 LAB — LIPASE: Lipase: 30 U/L (ref 0–75)

## 2014-11-01 NOTE — Progress Notes (Signed)
Labs drawn

## 2014-11-05 ENCOUNTER — Encounter: Payer: Self-pay | Admitting: Internal Medicine

## 2014-11-05 ENCOUNTER — Other Ambulatory Visit: Payer: Self-pay

## 2014-11-05 ENCOUNTER — Ambulatory Visit (INDEPENDENT_AMBULATORY_CARE_PROVIDER_SITE_OTHER): Payer: Medicare Other | Admitting: Internal Medicine

## 2014-11-05 VITALS — BP 103/61 | HR 80 | Temp 99.0°F | Ht 69.0 in | Wt 152.0 lb

## 2014-11-05 DIAGNOSIS — K219 Gastro-esophageal reflux disease without esophagitis: Secondary | ICD-10-CM | POA: Diagnosis not present

## 2014-11-05 DIAGNOSIS — K7290 Hepatic failure, unspecified without coma: Secondary | ICD-10-CM

## 2014-11-05 DIAGNOSIS — K5900 Constipation, unspecified: Secondary | ICD-10-CM

## 2014-11-05 DIAGNOSIS — K7469 Other cirrhosis of liver: Secondary | ICD-10-CM

## 2014-11-05 DIAGNOSIS — D509 Iron deficiency anemia, unspecified: Secondary | ICD-10-CM

## 2014-11-05 DIAGNOSIS — Z86010 Personal history of colonic polyps: Secondary | ICD-10-CM

## 2014-11-05 DIAGNOSIS — K7460 Unspecified cirrhosis of liver: Secondary | ICD-10-CM

## 2014-11-05 DIAGNOSIS — K7682 Hepatic encephalopathy: Secondary | ICD-10-CM

## 2014-11-05 MED ORDER — PEG 3350-KCL-NA BICARB-NACL 420 G PO SOLR: 4000.0000 mL | ORAL | Status: DC

## 2014-11-05 NOTE — Patient Instructions (Addendum)
Serum IGA and serum TTG IGA  Take lactulose 30cc in the morning and 20 cc in the evening  Continue Prilosec twice daily  Schedule a colonoscopy for iron deficiency anemia (split movie prep)  Further recommendations to follow
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
## 2014-11-07 ENCOUNTER — Encounter (HOSPITAL_BASED_OUTPATIENT_CLINIC_OR_DEPARTMENT_OTHER): Payer: Medicare Other

## 2014-11-07 VITALS — BP 102/62 | HR 78 | Temp 98.2°F | Resp 16

## 2014-11-07 DIAGNOSIS — D509 Iron deficiency anemia, unspecified: Secondary | ICD-10-CM

## 2014-11-07 MED ORDER — SODIUM CHLORIDE 0.9 % IV SOLN
INTRAVENOUS | Status: DC
  Administered 2014-11-07: 13:00:00 via INTRAVENOUS

## 2014-11-07 MED ORDER — SODIUM CHLORIDE 0.9 % IJ SOLN
10.0000 mL | Freq: Once | INTRAMUSCULAR | Status: AC
  Administered 2014-11-07: 10 mL via INTRAVENOUS

## 2014-11-07 MED ORDER — FERUMOXYTOL INJECTION 510 MG/17 ML
510.0000 mg | Freq: Once | INTRAVENOUS | Status: AC
  Administered 2014-11-07: 510 mg via INTRAVENOUS
  Filled 2014-11-07: qty 17

## 2014-11-07 NOTE — Patient Instructions (Signed)
[place] at [facility] Discharge Instructions  RECOMMENDATIONS MADE BY THE CONSULTANT AND ANY TEST RESULTS WILL BE SENT TO YOUR REFERRING PHYSICIAN.  Feraheme infusion today as ordered. Return as scheduled.  Thank you for choosing [place] at [facility] to provide your oncology and hematology care.  To afford each patient quality time with our provider, please arrive at least 15 minutes before your scheduled appointment time.    You need to re-schedule your appointment should you arrive 10 or more minutes late.  We strive to give you quality time with our providers, and arriving late affects you and other patients whose appointments are after yours.  Also, if you no show three or more times for appointments you may be dismissed from the clinic at the providers discretion.     Again, thank you for choosing [facility].  Our hope is that these requests will decrease the amount of time that you wait before being seen by our physicians.       _____________________________________________________________  Should you have questions after your visit to [facility], please contact our office at [phone] between the hours of 8:30 a.m. and 4:30 p.m.  Voicemails left after 4:30 p.m. will not be returned until the following business day.  For prescription refill requests, have your pharmacy contact our office.

## 2014-11-07 NOTE — Progress Notes (Signed)
Tolerated iron infusion well. 

## 2014-11-08 ENCOUNTER — Encounter (HOSPITAL_COMMUNITY): Payer: Self-pay | Admitting: Emergency Medicine

## 2014-11-08 ENCOUNTER — Emergency Department (HOSPITAL_COMMUNITY)
Admission: EM | Admit: 2014-11-08 | Discharge: 2014-11-08 | Discharge status: Against Medical Advice | Payer: Medicare Other | Attending: Emergency Medicine | Admitting: Emergency Medicine

## 2014-11-08 DIAGNOSIS — G8929 Other chronic pain: Secondary | ICD-10-CM | POA: Insufficient documentation

## 2014-11-08 DIAGNOSIS — E119 Type 2 diabetes mellitus without complications: Secondary | ICD-10-CM | POA: Insufficient documentation

## 2014-11-08 DIAGNOSIS — R109 Unspecified abdominal pain: Secondary | ICD-10-CM | POA: Insufficient documentation

## 2014-11-08 DIAGNOSIS — I10 Essential (primary) hypertension: Secondary | ICD-10-CM | POA: Insufficient documentation

## 2014-11-08 NOTE — ED Notes (Signed)
Having abdominal pain since 10 am this am.  Rates pain 10/10.  Took one tab oxycodone at noon and at 14:30, with mild relief.  Pt has what usually helps in a GI cocktail.  Pt of Dr [name], seen yesterday.  For colonoscopy on Monday.

## 2014-11-11 ENCOUNTER — Ambulatory Visit (HOSPITAL_COMMUNITY)
Admission: RE | Admit: 2014-11-11 | Discharge: 2014-11-11 | Disposition: A | Discharge status: Home / Self Care | Payer: Medicare Other | Source: Ambulatory Visit | Attending: Internal Medicine | Admitting: Internal Medicine

## 2014-11-11 ENCOUNTER — Encounter (HOSPITAL_COMMUNITY): Payer: Self-pay | Admitting: *Deleted

## 2014-11-11 ENCOUNTER — Encounter (HOSPITAL_COMMUNITY)
Admission: RE | Disposition: A | Discharge status: Home / Self Care | Payer: Self-pay | Source: Ambulatory Visit | Attending: Internal Medicine

## 2014-11-11 DIAGNOSIS — Z79891 Long term (current) use of opiate analgesic: Secondary | ICD-10-CM | POA: Insufficient documentation

## 2014-11-11 DIAGNOSIS — K7460 Unspecified cirrhosis of liver: Secondary | ICD-10-CM | POA: Diagnosis not present

## 2014-11-11 DIAGNOSIS — Z800 Family history of malignant neoplasm of digestive organs: Secondary | ICD-10-CM | POA: Insufficient documentation

## 2014-11-11 DIAGNOSIS — K5730 Diverticulosis of large intestine without perforation or abscess without bleeding: Secondary | ICD-10-CM | POA: Insufficient documentation

## 2014-11-11 DIAGNOSIS — Z86010 Personal history of colonic polyps: Secondary | ICD-10-CM | POA: Insufficient documentation

## 2014-11-11 DIAGNOSIS — K219 Gastro-esophageal reflux disease without esophagitis: Secondary | ICD-10-CM | POA: Diagnosis not present

## 2014-11-11 DIAGNOSIS — D696 Thrombocytopenia, unspecified: Secondary | ICD-10-CM | POA: Insufficient documentation

## 2014-11-11 DIAGNOSIS — Z794 Long term (current) use of insulin: Secondary | ICD-10-CM | POA: Insufficient documentation

## 2014-11-11 DIAGNOSIS — G8929 Other chronic pain: Secondary | ICD-10-CM | POA: Diagnosis not present

## 2014-11-11 DIAGNOSIS — E119 Type 2 diabetes mellitus without complications: Secondary | ICD-10-CM | POA: Diagnosis not present

## 2014-11-11 DIAGNOSIS — M549 Dorsalgia, unspecified: Secondary | ICD-10-CM | POA: Diagnosis not present

## 2014-11-11 DIAGNOSIS — Z9049 Acquired absence of other specified parts of digestive tract: Secondary | ICD-10-CM | POA: Diagnosis not present

## 2014-11-11 DIAGNOSIS — D509 Iron deficiency anemia, unspecified: Secondary | ICD-10-CM | POA: Insufficient documentation

## 2014-11-11 DIAGNOSIS — K7581 Nonalcoholic steatohepatitis (NASH): Secondary | ICD-10-CM | POA: Insufficient documentation

## 2014-11-11 DIAGNOSIS — Z79899 Other long term (current) drug therapy: Secondary | ICD-10-CM | POA: Insufficient documentation

## 2014-11-11 DIAGNOSIS — I10 Essential (primary) hypertension: Secondary | ICD-10-CM | POA: Insufficient documentation

## 2014-11-11 DIAGNOSIS — D123 Benign neoplasm of transverse colon: Secondary | ICD-10-CM | POA: Insufficient documentation

## 2014-11-11 DIAGNOSIS — F329 Major depressive disorder, single episode, unspecified: Secondary | ICD-10-CM | POA: Insufficient documentation

## 2014-11-11 DIAGNOSIS — G43909 Migraine, unspecified, not intractable, without status migrainosus: Secondary | ICD-10-CM | POA: Diagnosis not present

## 2014-11-11 DIAGNOSIS — F419 Anxiety disorder, unspecified: Secondary | ICD-10-CM | POA: Diagnosis not present

## 2014-11-11 DIAGNOSIS — Z8673 Personal history of transient ischemic attack (TIA), and cerebral infarction without residual deficits: Secondary | ICD-10-CM | POA: Diagnosis not present

## 2014-11-11 DIAGNOSIS — Z7982 Long term (current) use of aspirin: Secondary | ICD-10-CM | POA: Insufficient documentation

## 2014-11-11 DIAGNOSIS — G9340 Encephalopathy, unspecified: Secondary | ICD-10-CM | POA: Insufficient documentation

## 2014-11-11 DIAGNOSIS — D120 Benign neoplasm of cecum: Secondary | ICD-10-CM | POA: Insufficient documentation

## 2014-11-11 DIAGNOSIS — Z87891 Personal history of nicotine dependence: Secondary | ICD-10-CM | POA: Insufficient documentation

## 2014-11-11 DIAGNOSIS — E785 Hyperlipidemia, unspecified: Secondary | ICD-10-CM | POA: Diagnosis not present

## 2014-11-11 DIAGNOSIS — K635 Polyp of colon: Secondary | ICD-10-CM

## 2014-11-11 HISTORY — PX: COLONOSCOPY: SHX5424

## 2014-11-11 SURGERY — COLONOSCOPY
Anesthesia: Moderate Sedation

## 2014-11-11 MED ORDER — SODIUM CHLORIDE 0.9 % IV SOLN
INTRAVENOUS | Status: DC
  Administered 2014-11-11: 13:00:00 via INTRAVENOUS

## 2014-11-11 MED ORDER — STERILE WATER FOR IRRIGATION IR SOLN
Status: DC | PRN
  Administered 2014-11-11: 14:00:00

## 2014-11-11 MED ORDER — MIDAZOLAM HCL 2 MG/2ML IJ SOLN
2.0000 mg | Freq: Once | INTRAMUSCULAR | Status: AC
  Administered 2014-11-11: 2 mg via INTRAVENOUS

## 2014-11-11 MED ORDER — MEPERIDINE HCL 100 MG/ML IJ SOLN
INTRAMUSCULAR | Status: DC | PRN
  Administered 2014-11-11: 50 mg via INTRAVENOUS
  Administered 2014-11-11: 25 mg via INTRAVENOUS
  Administered 2014-11-11: 50 mg via INTRAVENOUS
  Administered 2014-11-11: 25 mg via INTRAVENOUS

## 2014-11-11 MED ORDER — MEPERIDINE HCL 100 MG/ML IJ SOLN
INTRAMUSCULAR | Status: AC
  Filled 2014-11-11: qty 2

## 2014-11-11 MED ORDER — ONDANSETRON HCL 4 MG/2ML IJ SOLN
INTRAMUSCULAR | Status: AC
  Filled 2014-11-11: qty 2

## 2014-11-11 MED ORDER — MIDAZOLAM HCL 5 MG/5ML IJ SOLN
INTRAMUSCULAR | Status: AC
  Filled 2014-11-11: qty 10

## 2014-11-11 MED ORDER — MIDAZOLAM HCL 5 MG/5ML IJ SOLN
INTRAMUSCULAR | Status: DC | PRN
Start: 2014-11-11 — End: 2014-11-11
  Administered 2014-11-11: 2 mg via INTRAVENOUS
  Administered 2014-11-11: 1 mg via INTRAVENOUS
  Administered 2014-11-11: 2 mg via INTRAVENOUS
  Administered 2014-11-11 (×2): 1 mg via INTRAVENOUS

## 2014-11-11 MED ORDER — MIDAZOLAM HCL 2 MG/2ML IJ SOLN
INTRAMUSCULAR | Status: AC
  Filled 2014-11-11: qty 2

## 2014-11-11 MED ORDER — ONDANSETRON HCL 4 MG/2ML IJ SOLN
INTRAMUSCULAR | Status: DC | PRN
  Administered 2014-11-11: 4 mg via INTRAVENOUS

## 2014-11-11 NOTE — Interval H&P Note (Signed)
History and Physical Interval Note:  11/11/2014 2:18 PM  Brad Wall  has presented today for surgery, with the diagnosis of iron deficiency anemia  The various methods of treatment have been discussed with the patient and family. After consideration of risks, benefits and other options for treatment, the patient has consented to  Procedure(s) with comments: COLONOSCOPY (N/A) - 200 as a surgical intervention .  The patient's history has been reviewed, patient examined, no change in status, stable for surgery.  I have reviewed the patient's chart and labs.  Questions were answered to the patient's satisfaction.     Brad Wall  No change. Diagnostic colonoscopy per plan.The risks, benefits, limitations, alternatives and imponderables have been reviewed with the patient. Questions have been answered. All parties are agreeable.

## 2014-11-11 NOTE — Discharge Instructions (Signed)
Colonoscopy Discharge Instructions  Read the instructions outlined below and refer to this sheet in the next few weeks. These discharge instructions provide you with general information on caring for yourself after you leave the hospital. Your doctor may also give you specific instructions. While your treatment has been planned according to the most current medical practices available, unavoidable complications occasionally occur. If you have any problems or questions after discharge, call Dr. [name] at [phone]. ACTIVITY  You may resume your regular activity, but move at a slower pace for the next 24 hours.   Take frequent rest periods for the next 24 hours.   Walking will help get rid of the air and reduce the bloated feeling in your belly (abdomen).   No driving for 24 hours (because of the medicine (anesthesia) used during the test).    Do not sign any important legal documents or operate any machinery for 24 hours (because of the anesthesia used during the test).  NUTRITION  Drink plenty of fluids.   You may resume your normal diet as instructed by your doctor.   Begin with a light meal and progress to your normal diet. Heavy or fried foods are harder to digest and may make you feel sick to your stomach (nauseated).   Avoid alcoholic beverages for 24 hours or as instructed.  MEDICATIONS  You may resume your normal medications unless your doctor tells you otherwise.  WHAT YOU CAN EXPECT TODAY  Some feelings of bloating in the abdomen.   Passage of more gas than usual.   Spotting of blood in your stool or on the toilet paper.  IF YOU HAD POLYPS REMOVED DURING THE COLONOSCOPY:  No aspirin products for 7 days or as instructed.   No alcohol for 7 days or as instructed.   Eat a soft diet for the next 24 hours.  FINDING OUT THE RESULTS OF YOUR TEST Not all test results are available during your visit. If your test results are not back during the visit, make an appointment  with your caregiver to find out the results. Do not assume everything is normal if you have not heard from your caregiver or the medical facility. It is important for you to follow up on all of your test results.  SEEK IMMEDIATE MEDICAL ATTENTION IF:  You have more than a spotting of blood in your stool.   Your belly is swollen (abdominal distention).   You are nauseated or vomiting.   You have a temperature over 101.   You have abdominal pain or discomfort that is severe or gets worse throughout the day.    Diverticulosis and polyp information provided  Further recommendations to follow pending review of pathology report   Colon Polyps Polyps are lumps of extra tissue growing inside the body. Polyps can grow in the large intestine (colon). Most colon polyps are noncancerous (benign). However, some colon polyps can become cancerous over time. Polyps that are larger than a pea may be harmful. To be safe, caregivers remove and test all polyps. CAUSES  Polyps form when mutations in the genes cause your cells to grow and divide even though no more tissue is needed. RISK FACTORS There are a number of risk factors that can increase your chances of getting colon polyps. They include:  Being older than 50 years.  Family history of colon polyps or colon cancer.  Long-term colon diseases, such as colitis or Crohn disease.  Being overweight.  Smoking.  Being inactive.  Drinking too  much alcohol. SYMPTOMS  Most small polyps do not cause symptoms. If symptoms are present, they may include:  Blood in the stool. The stool may look dark red or black.  Constipation or diarrhea that lasts longer than 1 week. DIAGNOSIS People often do not know they have polyps until their caregiver finds them during a regular checkup. Your caregiver can use 4 tests to check for polyps:  Digital rectal exam. The caregiver wears gloves and feels inside the rectum. This test would find polyps only in the  rectum.  Barium enema. The caregiver puts a liquid called barium into your rectum before taking X-rays of your colon. Barium makes your colon look white. Polyps are dark, so they are easy to see in the X-ray pictures.  Sigmoidoscopy. A thin, flexible tube (sigmoidoscope) is placed into your rectum. The sigmoidoscope has a light and tiny camera in it. The caregiver uses the sigmoidoscope to look at the last third of your colon.  Colonoscopy. This test is like sigmoidoscopy, but the caregiver looks at the entire colon. This is the most common method for finding and removing polyps. TREATMENT  Any polyps will be removed during a sigmoidoscopy or colonoscopy. The polyps are then tested for cancer. PREVENTION  To help lower your risk of getting more colon polyps:  Eat plenty of fruits and vegetables. Avoid eating fatty foods.  Do not smoke.  Avoid drinking alcohol.  Exercise every day.  Lose weight if recommended by your caregiver.  Eat plenty of calcium and folate. Foods that are rich in calcium include milk, cheese, and broccoli. Foods that are rich in folate include chickpeas, kidney beans, and spinach. HOME CARE INSTRUCTIONS Keep all follow-up appointments as directed by your caregiver. You may need periodic exams to check for polyps. SEEK MEDICAL CARE IF: You notice bleeding during a bowel movement. Document Released: 03/10/2004 Document Revised: 09/06/2011 Document Reviewed: 08/24/2011 [facility] Patient Information 2015 [place], [place]. This information is not intended to replace advice given to you by your health care provider. Make sure you discuss any questions you have with your health care provider.   Diverticulosis Diverticulosis is the condition that develops when small pouches (diverticula) form in the wall of your colon. Your colon, or large intestine, is where water is absorbed and stool is formed. The pouches form when the inside layer of your colon pushes through weak  spots in the outer layers of your colon. CAUSES  No one knows exactly what causes diverticulosis. RISK FACTORS  Being older than 48. Your risk for this condition increases with age. Diverticulosis is rare in people younger than 40 years. By age 72, almost everyone has it.  Eating a low-fiber diet.  Being frequently constipated.  Being overweight.  Not getting enough exercise.  Smoking.  Taking over-the-counter pain medicines, like aspirin and ibuprofen. SYMPTOMS  Most people with diverticulosis do not have symptoms. DIAGNOSIS  Because diverticulosis often has no symptoms, health care providers often discover the condition during an exam for other colon problems. In many cases, a health care provider will diagnose diverticulosis while using a flexible scope to examine the colon (colonoscopy). TREATMENT  If you have never developed an infection related to diverticulosis, you may not need treatment. If you have had an infection before, treatment may include:  Eating more fruits, vegetables, and grains.  Taking a fiber supplement.  Taking a live bacteria supplement (probiotic).  Taking medicine to relax your colon. HOME CARE INSTRUCTIONS   Drink at least 6-8 glasses of  water each day to prevent constipation.  Try not to strain when you have a bowel movement.  Keep all follow-up appointments. If you have had an infection before:  Increase the fiber in your diet as directed by your health care provider or dietitian.  Take a dietary fiber supplement if your health care provider approves.  Only take medicines as directed by your health care provider. SEEK MEDICAL CARE IF:   You have abdominal pain.  You have bloating.  You have cramps.  You have not gone to the bathroom in 3 days. SEEK IMMEDIATE MEDICAL CARE IF:   Your pain gets worse.  Yourbloating becomes very bad.  You have a fever or chills, and your symptoms suddenly get worse.  You begin vomiting.  You  have bowel movements that are bloody or black. MAKE SURE YOU:  Understand these instructions.  Will watch your condition.  Will get help right away if you are not doing well or get worse. Document Released: 03/11/2004 Document Revised: 06/19/2013 Document Reviewed: 05/09/2013 [facility] Patient Information 2015 [place], [place]. This information is not intended to replace advice given to you by your health care provider. Make sure you discuss any questions you have with your health care provider.

## 2014-11-11 NOTE — OR Nursing (Signed)
Patient c/o of 9/10 back pain.  Dr [name] notified and gave order to give 1 dose of 2 mg versed.
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
## 2014-11-11 NOTE — Op Note (Signed)
[facility] [address] [place], [address]   COLONOSCOPY PROCEDURE REPORT  PATIENT: Brad Wall, Brad Wall  MR#: [id number] BIRTHDATE: [birth year] , 25  yrs. old GENDER: male ENDOSCOPIST: R.  Garfield Cornea, MD FACP [facility] REFERRED YJ:EHUDJ Karie Kirks, M.D. PROCEDURE DATE:  Nov 13, 2014 PROCEDURE:   Ileo-colonoscopy with snare polypectomy and biopsy  INDICATIONS:iron deficiency anemia; personal history of colonic polyps and positive family history of colon cancer. MEDICATIONS: Versed 7 mg IV and Demerol 150 mg IV in divided doses. Zofran 4 mg IV. ASA CLASS:       Class III  CONSENT: The risks, benefits, alternatives and imponderables including but not limited to bleeding, perforation as well as the possibility of a missed lesion have been reviewed.  The potential for biopsy, lesion removal, etc. have also been discussed. Questions have been answered.  All parties agreeable.  Please see the history and physical in the medical record for more information.  DESCRIPTION OF PROCEDURE:   After the risks benefits and alternatives of the procedure were thoroughly explained, informed consent was obtained.  The digital rectal exam revealed no abnormalities of the rectum.   The EC-[id number] ([id number])  endoscope was introduced through the anus and advanced to the terminal ileum which was intubated for a short distance. No adverse events experienced.   The quality of the prep was adequate  The instrument was then slowly withdrawn as the colon was fully examined.      COLON FINDINGS: Normal-appearing rectum.  Scattered left-sided diverticula.  There was (1) diminutive polyp at the splenic flexure and (1) 5 mm polyp?"pedunculated opposite ileocecal valve; otherwise, the remainder of the colonic mucosa appeared normal. The distal 5 cm of terminal ileal mucosa also appeared normal.  The above-mentioned polyps were cold biopsy and hot snare removed, respectively.  Retroflexion was  performed. .  Withdrawal time=8 minutes 0 seconds.  The scope was withdrawn and the procedure completed. COMPLICATIONS: There were no immediate complications.  ENDOSCOPIC IMPRESSION: Colonic diverticulosis. Colonic polyps?"removed as described above. Polyps were small and would not be responsible for iron deficiency anemia.  RECOMMENDATIONS: Follow-up on pathology. Follow-up on celiac screening serologies.  eSigned:  R. Garfield Cornea, MD Rosalita Chessman [facility] 11/13/14 3:15 PM   cc:  CPT CODES: ICD CODES:  The ICD and CPT codes recommended by this software are interpretations from the data that the clinical staff has captured with the software.  The verification of the translation of this report to the ICD and CPT codes and modifiers is the sole responsibility of the health care institution and practicing physician where this report was generated.  [employer] will not be held responsible for the validity of the ICD and CPT codes included on this report.  AMA assumes no liability for data contained or not contained herein. CPT is a Designer, television/film set of the [employer].  PATIENT NAME:  Brad Wall MR#: [id number]

## 2014-11-13 ENCOUNTER — Encounter: Payer: Self-pay | Admitting: Internal Medicine

## 2014-11-13 ENCOUNTER — Encounter (HOSPITAL_COMMUNITY): Payer: Self-pay | Admitting: Internal Medicine

## 2014-11-15 ENCOUNTER — Other Ambulatory Visit (HOSPITAL_COMMUNITY): Payer: Self-pay | Admitting: Family Medicine

## 2014-11-15 DIAGNOSIS — R161 Splenomegaly, not elsewhere classified: Secondary | ICD-10-CM

## 2014-11-16 LAB — IGA: IGA: 191 mg/dL (ref 68–379)

## 2014-11-18 LAB — TISSUE TRANSGLUTAMINASE, IGA: TISSUE TRANSGLUTAMINASE AB, IGA: 1 U/mL (ref <4)

## 2014-11-19 ENCOUNTER — Ambulatory Visit (HOSPITAL_COMMUNITY)
Admission: RE | Admit: 2014-11-19 | Discharge: 2014-11-19 | Disposition: A | Discharge status: Home / Self Care | Payer: Medicare Other | Source: Ambulatory Visit | Attending: Family Medicine | Admitting: Family Medicine

## 2014-11-19 DIAGNOSIS — M12851 Other specific arthropathies, not elsewhere classified, right hip: Secondary | ICD-10-CM | POA: Diagnosis not present

## 2014-11-19 DIAGNOSIS — M5136 Other intervertebral disc degeneration, lumbar region: Secondary | ICD-10-CM | POA: Insufficient documentation

## 2014-11-19 DIAGNOSIS — I10 Essential (primary) hypertension: Secondary | ICD-10-CM | POA: Insufficient documentation

## 2014-11-19 DIAGNOSIS — R109 Unspecified abdominal pain: Secondary | ICD-10-CM | POA: Insufficient documentation

## 2014-11-19 DIAGNOSIS — N200 Calculus of kidney: Secondary | ICD-10-CM | POA: Insufficient documentation

## 2014-11-19 DIAGNOSIS — I700 Atherosclerosis of aorta: Secondary | ICD-10-CM | POA: Insufficient documentation

## 2014-11-19 DIAGNOSIS — R188 Other ascites: Secondary | ICD-10-CM | POA: Insufficient documentation

## 2014-11-19 DIAGNOSIS — R161 Splenomegaly, not elsewhere classified: Secondary | ICD-10-CM | POA: Insufficient documentation

## 2014-11-19 DIAGNOSIS — E119 Type 2 diabetes mellitus without complications: Secondary | ICD-10-CM | POA: Diagnosis not present

## 2014-11-19 DIAGNOSIS — K7460 Unspecified cirrhosis of liver: Secondary | ICD-10-CM | POA: Diagnosis not present

## 2014-11-19 DIAGNOSIS — N400 Enlarged prostate without lower urinary tract symptoms: Secondary | ICD-10-CM | POA: Diagnosis not present

## 2014-11-19 DIAGNOSIS — R938 Abnormal findings on diagnostic imaging of other specified body structures: Secondary | ICD-10-CM | POA: Diagnosis not present

## 2014-11-19 DIAGNOSIS — K5730 Diverticulosis of large intestine without perforation or abscess without bleeding: Secondary | ICD-10-CM | POA: Insufficient documentation

## 2014-11-19 DIAGNOSIS — I8500 Esophageal varices without bleeding: Secondary | ICD-10-CM | POA: Insufficient documentation

## 2014-11-19 DIAGNOSIS — M47896 Other spondylosis, lumbar region: Secondary | ICD-10-CM | POA: Diagnosis not present

## 2014-11-19 DIAGNOSIS — M12852 Other specific arthropathies, not elsewhere classified, left hip: Secondary | ICD-10-CM | POA: Diagnosis not present

## 2014-11-19 LAB — POCT I-STAT CREATININE: CREATININE: 1 mg/dL (ref 0.61–1.24)

## 2014-11-19 MED ORDER — IOHEXOL 300 MG/ML  SOLN
100.0000 mL | Freq: Once | INTRAMUSCULAR | Status: AC | PRN
  Administered 2014-11-19: 100 mL via INTRAVENOUS

## 2014-11-29 ENCOUNTER — Telehealth: Payer: Self-pay | Admitting: Internal Medicine

## 2014-11-29 NOTE — Telephone Encounter (Signed)
Dr. Karie Kirks called. Patient still complaining of epigastric pain. Dr. Karie Kirks added Maalox and ordered a CT. Recent scan results reviewed. Nothing new on scan. Pancreas looks normal. He has a history of candidal esophagitis last year but symptoms were quite different. He asked that patient be seen once again in the office. I again note that our medical record says he was seen down at [facility] in 2012. It would be nice to retrieve records. Since I will be essentially unavailable for seeing anybody in the office the next couple of weeks, I would offer this nice gentleman an expedited appointment with an extender in the next week or so.

## 2014-12-02 NOTE — Telephone Encounter (Signed)
Pt has an appointment on 12/18/14 @ 1:30

## 2014-12-02 NOTE — Telephone Encounter (Signed)
Tried to call with no answer . LMOM to call back 

## 2014-12-03 ENCOUNTER — Observation Stay (HOSPITAL_COMMUNITY)
Admission: EM | Admit: 2014-12-03 | Discharge: 2014-12-04 | Disposition: A | Discharge status: Home / Self Care | Payer: Medicare Other | Attending: Internal Medicine | Admitting: Internal Medicine

## 2014-12-03 ENCOUNTER — Emergency Department (HOSPITAL_COMMUNITY): Payer: Medicare Other

## 2014-12-03 ENCOUNTER — Telehealth: Payer: Self-pay | Admitting: Internal Medicine

## 2014-12-03 ENCOUNTER — Encounter (HOSPITAL_COMMUNITY): Payer: Self-pay

## 2014-12-03 ENCOUNTER — Observation Stay (HOSPITAL_COMMUNITY): Payer: Medicare Other

## 2014-12-03 DIAGNOSIS — Z888 Allergy status to other drugs, medicaments and biological substances status: Secondary | ICD-10-CM | POA: Insufficient documentation

## 2014-12-03 DIAGNOSIS — E1149 Type 2 diabetes mellitus with other diabetic neurological complication: Secondary | ICD-10-CM | POA: Diagnosis not present

## 2014-12-03 DIAGNOSIS — D696 Thrombocytopenia, unspecified: Secondary | ICD-10-CM | POA: Diagnosis not present

## 2014-12-03 DIAGNOSIS — I639 Cerebral infarction, unspecified: Secondary | ICD-10-CM | POA: Diagnosis not present

## 2014-12-03 DIAGNOSIS — Z800 Family history of malignant neoplasm of digestive organs: Secondary | ICD-10-CM | POA: Insufficient documentation

## 2014-12-03 DIAGNOSIS — Z87891 Personal history of nicotine dependence: Secondary | ICD-10-CM | POA: Diagnosis not present

## 2014-12-03 DIAGNOSIS — K7581 Nonalcoholic steatohepatitis (NASH): Secondary | ICD-10-CM | POA: Insufficient documentation

## 2014-12-03 DIAGNOSIS — F419 Anxiety disorder, unspecified: Secondary | ICD-10-CM | POA: Insufficient documentation

## 2014-12-03 DIAGNOSIS — Z801 Family history of malignant neoplasm of trachea, bronchus and lung: Secondary | ICD-10-CM | POA: Diagnosis not present

## 2014-12-03 DIAGNOSIS — G43419 Hemiplegic migraine, intractable, without status migrainosus: Secondary | ICD-10-CM

## 2014-12-03 DIAGNOSIS — Z794 Long term (current) use of insulin: Secondary | ICD-10-CM | POA: Insufficient documentation

## 2014-12-03 DIAGNOSIS — D509 Iron deficiency anemia, unspecified: Secondary | ICD-10-CM | POA: Insufficient documentation

## 2014-12-03 DIAGNOSIS — Z8673 Personal history of transient ischemic attack (TIA), and cerebral infarction without residual deficits: Secondary | ICD-10-CM | POA: Insufficient documentation

## 2014-12-03 DIAGNOSIS — E785 Hyperlipidemia, unspecified: Secondary | ICD-10-CM | POA: Diagnosis not present

## 2014-12-03 DIAGNOSIS — Z8042 Family history of malignant neoplasm of prostate: Secondary | ICD-10-CM | POA: Diagnosis not present

## 2014-12-03 DIAGNOSIS — K219 Gastro-esophageal reflux disease without esophagitis: Secondary | ICD-10-CM | POA: Diagnosis not present

## 2014-12-03 DIAGNOSIS — K7460 Unspecified cirrhosis of liver: Secondary | ICD-10-CM | POA: Diagnosis present

## 2014-12-03 DIAGNOSIS — G43909 Migraine, unspecified, not intractable, without status migrainosus: Secondary | ICD-10-CM | POA: Diagnosis present

## 2014-12-03 DIAGNOSIS — G459 Transient cerebral ischemic attack, unspecified: Secondary | ICD-10-CM | POA: Diagnosis present

## 2014-12-03 DIAGNOSIS — E1141 Type 2 diabetes mellitus with diabetic mononeuropathy: Secondary | ICD-10-CM | POA: Diagnosis not present

## 2014-12-03 DIAGNOSIS — Z7982 Long term (current) use of aspirin: Secondary | ICD-10-CM | POA: Diagnosis not present

## 2014-12-03 DIAGNOSIS — R29810 Facial weakness: Secondary | ICD-10-CM | POA: Diagnosis present

## 2014-12-03 DIAGNOSIS — F329 Major depressive disorder, single episode, unspecified: Secondary | ICD-10-CM | POA: Diagnosis not present

## 2014-12-03 DIAGNOSIS — Z880 Allergy status to penicillin: Secondary | ICD-10-CM | POA: Insufficient documentation

## 2014-12-03 LAB — APTT: APTT: 31 s (ref 24–37)

## 2014-12-03 LAB — PROTIME-INR
INR: 1.22 (ref 0.00–1.49)
Prothrombin Time: 15.5 seconds — ABNORMAL HIGH (ref 11.6–15.2)

## 2014-12-03 LAB — I-STAT CHEM 8, ED
BUN: 10 mg/dL (ref 6–20)
Calcium, Ion: 1.26 mmol/L (ref 1.13–1.30)
Chloride: 107 mmol/L (ref 101–111)
Creatinine, Ser: 1.3 mg/dL — ABNORMAL HIGH (ref 0.61–1.24)
Glucose, Bld: 162 mg/dL — ABNORMAL HIGH (ref 65–99)
HCT: 35 % — ABNORMAL LOW (ref 39.0–52.0)
HEMOGLOBIN: 11.9 g/dL — AB (ref 13.0–17.0)
POTASSIUM: 4.9 mmol/L (ref 3.5–5.1)
Sodium: 137 mmol/L (ref 135–145)
TCO2: 16 mmol/L (ref 0–100)

## 2014-12-03 LAB — COMPREHENSIVE METABOLIC PANEL
ALT: 30 U/L (ref 17–63)
AST: 43 U/L — ABNORMAL HIGH (ref 15–41)
Albumin: 4.3 g/dL (ref 3.5–5.0)
Alkaline Phosphatase: 129 U/L — ABNORMAL HIGH (ref 38–126)
Anion gap: 10 (ref 5–15)
BILIRUBIN TOTAL: 0.5 mg/dL (ref 0.3–1.2)
BUN: 11 mg/dL (ref 6–20)
CALCIUM: 9.3 mg/dL (ref 8.9–10.3)
CO2: 19 mmol/L — ABNORMAL LOW (ref 22–32)
CREATININE: 1.15 mg/dL (ref 0.61–1.24)
Chloride: 106 mmol/L (ref 101–111)
GFR calc Af Amer: >60 mL/min (ref >60)
GFR calc non Af Amer: >60 mL/min (ref >60)
Glucose, Bld: 164 mg/dL — ABNORMAL HIGH (ref 65–99)
POTASSIUM: 4.9 mmol/L (ref 3.5–5.1)
Sodium: 135 mmol/L (ref 135–145)
TOTAL PROTEIN: 7.3 g/dL (ref 6.5–8.1)

## 2014-12-03 LAB — CBC
HCT: 34.8 % — ABNORMAL LOW (ref 39.0–52.0)
Hemoglobin: 10.9 g/dL — ABNORMAL LOW (ref 13.0–17.0)
MCH: 30.2 pg (ref 26.0–34.0)
MCHC: 31.3 g/dL (ref 30.0–36.0)
MCV: 96.4 fL (ref 78.0–100.0)
Platelets: 106 10*3/uL — ABNORMAL LOW (ref 150–400)
RBC: 3.61 MIL/uL — AB (ref 4.22–5.81)
RDW: 17.7 % — ABNORMAL HIGH (ref 11.5–15.5)
WBC: 4 10*3/uL (ref 4.0–10.5)

## 2014-12-03 LAB — URINALYSIS, ROUTINE W REFLEX MICROSCOPIC
BILIRUBIN URINE: NEGATIVE
GLUCOSE, UA: NEGATIVE mg/dL
Hgb urine dipstick: NEGATIVE
KETONES UR: NEGATIVE mg/dL
LEUKOCYTES UA: NEGATIVE
Nitrite: NEGATIVE
Protein, ur: NEGATIVE mg/dL
Specific Gravity, Urine: 1.014 (ref 1.005–1.030)
Urobilinogen, UA: 0.2 mg/dL (ref 0.0–1.0)
pH: 6 (ref 5.0–8.0)

## 2014-12-03 LAB — RAPID URINE DRUG SCREEN, HOSP PERFORMED
Amphetamines: NOT DETECTED
BARBITURATES: POSITIVE — AB
Benzodiazepines: NOT DETECTED
Cocaine: NOT DETECTED
OPIATES: POSITIVE — AB
Tetrahydrocannabinol: NOT DETECTED

## 2014-12-03 LAB — DIFFERENTIAL
BASOS PCT: 1 % (ref 0–1)
Basophils Absolute: 0 10*3/uL (ref 0.0–0.1)
EOS ABS: 0.1 10*3/uL (ref 0.0–0.7)
Eosinophils Relative: 3 % (ref 0–5)
LYMPHS PCT: 13 % (ref 12–46)
Lymphs Abs: 0.5 10*3/uL — ABNORMAL LOW (ref 0.7–4.0)
MONOS PCT: 8 % (ref 3–12)
Monocytes Absolute: 0.3 10*3/uL (ref 0.1–1.0)
NEUTROS PCT: 76 % (ref 43–77)
Neutro Abs: 3 10*3/uL (ref 1.7–7.7)

## 2014-12-03 LAB — ETHANOL

## 2014-12-03 LAB — AMMONIA: AMMONIA: 59 umol/L — AB (ref 9–35)

## 2014-12-03 LAB — I-STAT TROPONIN, ED: Troponin i, poc: 0 ng/mL (ref 0.00–0.08)

## 2014-12-03 MED ORDER — MORPHINE SULFATE 2 MG/ML IJ SOLN
2.0000 mg | Freq: Once | INTRAMUSCULAR | Status: AC
  Administered 2014-12-03: 2 mg via INTRAVENOUS
  Filled 2014-12-03: qty 1

## 2014-12-03 MED ORDER — DIPHENHYDRAMINE HCL 50 MG/ML IJ SOLN
25.0000 mg | Freq: Once | INTRAMUSCULAR | Status: AC
  Administered 2014-12-03: 25 mg via INTRAVENOUS
  Filled 2014-12-03: qty 1

## 2014-12-03 MED ORDER — METOCLOPRAMIDE HCL 5 MG/ML IJ SOLN
10.0000 mg | Freq: Once | INTRAMUSCULAR | Status: AC
  Administered 2014-12-03: 10 mg via INTRAVENOUS
  Filled 2014-12-03: qty 2

## 2014-12-03 MED ORDER — LORAZEPAM 2 MG/ML IJ SOLN
2.0000 mg | Freq: Once | INTRAMUSCULAR | Status: AC
  Administered 2014-12-03: 2 mg via INTRAVENOUS
  Filled 2014-12-03: qty 1

## 2014-12-03 MED ORDER — KETOROLAC TROMETHAMINE 30 MG/ML IJ SOLN
30.0000 mg | Freq: Once | INTRAMUSCULAR | Status: AC
  Administered 2014-12-03: 30 mg via INTRAVENOUS
  Filled 2014-12-03: qty 1

## 2014-12-03 MED ORDER — IOHEXOL 350 MG/ML SOLN
50.0000 mL | Freq: Once | INTRAVENOUS | Status: AC | PRN
  Administered 2014-12-03: 50 mL via INTRAVENOUS

## 2014-12-03 NOTE — ED Notes (Signed)
Dr. Roderic Palau examining pt on EMS stretcher, CT aware of pt and is ready.

## 2014-12-03 NOTE — ED Notes (Signed)
EMS reports pt was at [facility] with family shopping.  Reports pt started to not feel well so sat down.  Started having left sided weakness, facial, droop, and difficulty speaking at 1505.  Per EMS pt could not move left side but could squeeze slightly with left hand.  C/O pain in head down to left foot.  Family reports history of TIA x 2.  CBG 148 per EMS, Normal sinus on monitor.  BP 140/90 bp and HR 91
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
## 2014-12-03 NOTE — ED Notes (Signed)
Patient with c/o headache, MD aware. Verbal order for 2 mg Morphine obtained.

## 2014-12-03 NOTE — ED Notes (Signed)
Patient left ED at this time with [facility] for transport to [facility].

## 2014-12-03 NOTE — ED Notes (Signed)
Patient transported to MRI 
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
## 2014-12-03 NOTE — ED Notes (Signed)
EMS loading pt on their stretcher.

## 2014-12-03 NOTE — Progress Notes (Signed)
Code stroke   Phone call 3:36pm code stroke 10 minutes out  Paged 3:42pm Code stroke rm 4   5 minutes out  3:45pm patient came in CT via EMS... EMS left patient in CT no bed Tiffany RN and lab person also in [place] at [facility] [place] 3:53pm
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
## 2014-12-03 NOTE — ED Notes (Signed)
Admitting MD at bedside.

## 2014-12-03 NOTE — Telephone Encounter (Signed)
I requested records from [facility] on April 21 and again today asking for any GI related records dating back as far as April of last year to the present. They faxed me back again late this afternoon saying they do not have records on this patient.

## 2014-12-03 NOTE — ED Notes (Signed)
[facility] at bedside for transport to [facility] ED.

## 2014-12-03 NOTE — ED Notes (Signed)
RN at patient's bedside since he arrived in department. Neuro consult with [facility] provider, Dr Netta Neat, complete at this time. Excluded from tPA at [place] by Dr Netta Neat due to PT level. NIH score 7.

## 2014-12-03 NOTE — ED Notes (Signed)
Pt transferring from [facility] via [facility]. Pt was in [place] eating lunch at 1505 when family noticed pt started developing some left sided facial droop, aphasia, and weakness. EMS arrived on scene at 1520 and transported pt to [facility]. Pt when to CT- which confirmed stroke and pt was then transported to [facility]. Pt has a history of TIA. EMS V/S during [facility] B/P 112/71, HR 93, RR 21.
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
## 2014-12-03 NOTE — ED Notes (Signed)
Swallow screen performed at 1616

## 2014-12-03 NOTE — Telephone Encounter (Signed)
Request was sent for records in [place] back in April 2016, however they faxed [place] back and stated they didn't have any records for the patient.  Manuela Schwartz will fax the request for a second time to see if anything has changed.   Almyra Free is also looking in [facility] Everywhere to see if she can locate any records.

## 2014-12-03 NOTE — ED Notes (Signed)
Neuro (accepting) MD at bedside.

## 2014-12-03 NOTE — ED Notes (Signed)
Code Stroke called per Dr. Roderic Palau

## 2014-12-03 NOTE — ED Notes (Signed)
Family at bedside. 

## 2014-12-04 ENCOUNTER — Encounter (HOSPITAL_COMMUNITY): Payer: Self-pay | Admitting: General Practice

## 2014-12-04 ENCOUNTER — Observation Stay (HOSPITAL_COMMUNITY): Payer: Medicare Other

## 2014-12-04 DIAGNOSIS — D696 Thrombocytopenia, unspecified: Secondary | ICD-10-CM | POA: Diagnosis not present

## 2014-12-04 DIAGNOSIS — K7581 Nonalcoholic steatohepatitis (NASH): Secondary | ICD-10-CM

## 2014-12-04 DIAGNOSIS — K219 Gastro-esophageal reflux disease without esophagitis: Secondary | ICD-10-CM | POA: Diagnosis not present

## 2014-12-04 DIAGNOSIS — G459 Transient cerebral ischemic attack, unspecified: Secondary | ICD-10-CM | POA: Diagnosis not present

## 2014-12-04 DIAGNOSIS — E1149 Type 2 diabetes mellitus with other diabetic neurological complication: Secondary | ICD-10-CM | POA: Diagnosis not present

## 2014-12-04 DIAGNOSIS — E1141 Type 2 diabetes mellitus with diabetic mononeuropathy: Secondary | ICD-10-CM

## 2014-12-04 DIAGNOSIS — G43909 Migraine, unspecified, not intractable, without status migrainosus: Secondary | ICD-10-CM | POA: Diagnosis not present

## 2014-12-04 LAB — LIPID PANEL
CHOLESTEROL: 99 mg/dL (ref 0–200)
HDL: 26 mg/dL — ABNORMAL LOW (ref >40)
LDL CALC: 44 mg/dL (ref 0–99)
TRIGLYCERIDES: 146 mg/dL (ref <150)
Total CHOL/HDL Ratio: 3.8 RATIO
VLDL: 29 mg/dL (ref 0–40)

## 2014-12-04 LAB — GLUCOSE, CAPILLARY
GLUCOSE-CAPILLARY: 113 mg/dL — AB (ref 65–99)
Glucose-Capillary: 134 mg/dL — ABNORMAL HIGH (ref 65–99)
Glucose-Capillary: 145 mg/dL — ABNORMAL HIGH (ref 65–99)
Glucose-Capillary: 236 mg/dL — ABNORMAL HIGH (ref 65–99)

## 2014-12-04 LAB — TROPONIN I
Troponin I: <0.03 ng/mL (ref <0.031)
Troponin I: <0.03 ng/mL (ref <0.031)
Troponin I: <0.03 ng/mL (ref <0.031)

## 2014-12-04 MED ORDER — METOCLOPRAMIDE HCL 5 MG/ML IJ SOLN
5.0000 mg | Freq: Three times a day (TID) | INTRAMUSCULAR | Status: DC
  Administered 2014-12-04 (×2): 5 mg via INTRAVENOUS
  Filled 2014-12-04 (×5): qty 1

## 2014-12-04 MED ORDER — BUTALBITAL-APAP-CAFFEINE 50-325-40 MG PO TABS
1.0000 | ORAL_TABLET | Freq: Four times a day (QID) | ORAL | Status: DC | PRN
  Administered 2014-12-04: 1 via ORAL
  Filled 2014-12-04: qty 1

## 2014-12-04 MED ORDER — NORTRIPTYLINE HCL 25 MG PO CAPS
50.0000 mg | ORAL_CAPSULE | Freq: Every day | ORAL | Status: DC
  Filled 2014-12-04: qty 2

## 2014-12-04 MED ORDER — TOPIRAMATE 100 MG PO TABS
200.0000 mg | ORAL_TABLET | Freq: Every day | ORAL | Status: DC
  Filled 2014-12-04: qty 2

## 2014-12-04 MED ORDER — ACETAMINOPHEN 325 MG PO TABS: 650.0000 mg | ORAL_TABLET | ORAL | Status: DC | PRN

## 2014-12-04 MED ORDER — INSULIN ASPART 100 UNIT/ML ~~LOC~~ SOLN
0.0000 [IU] | Freq: Three times a day (TID) | SUBCUTANEOUS | Status: DC
  Administered 2014-12-04: 1 [IU] via SUBCUTANEOUS
  Administered 2014-12-04: 3 [IU] via SUBCUTANEOUS

## 2014-12-04 MED ORDER — ASPIRIN 325 MG PO TABS: 325.0000 mg | ORAL_TABLET | Freq: Every day | ORAL | Status: DC

## 2014-12-04 MED ORDER — PNEUMOCOCCAL VAC POLYVALENT 25 MCG/0.5ML IJ INJ: 0.5000 mL | INJECTION | INTRAMUSCULAR | Status: DC

## 2014-12-04 MED ORDER — SODIUM CHLORIDE 0.9 % IV SOLN
INTRAVENOUS | Status: AC
  Administered 2014-12-04: 03:00:00 via INTRAVENOUS

## 2014-12-04 MED ORDER — LACTULOSE 10 GM/15ML PO SOLN
30.0000 g | Freq: Every day | ORAL | Status: DC
  Administered 2014-12-04: 30 g via ORAL
  Filled 2014-12-04: qty 45

## 2014-12-04 MED ORDER — LORAZEPAM 1 MG PO TABS: 1.0000 mg | ORAL_TABLET | Freq: Every evening | ORAL | Status: DC | PRN

## 2014-12-04 MED ORDER — PROPRANOLOL HCL 20 MG PO TABS
20.0000 mg | ORAL_TABLET | Freq: Two times a day (BID) | ORAL | Status: DC
  Administered 2014-12-04: 20 mg via ORAL
  Filled 2014-12-04 (×3): qty 1

## 2014-12-04 MED ORDER — FOLIC ACID 1 MG PO TABS
1.0000 mg | ORAL_TABLET | Freq: Every day | ORAL | Status: DC
  Administered 2014-12-04: 1 mg via ORAL
  Filled 2014-12-04: qty 1

## 2014-12-04 MED ORDER — GEMFIBROZIL 600 MG PO TABS
600.0000 mg | ORAL_TABLET | Freq: Two times a day (BID) | ORAL | Status: DC
  Administered 2014-12-04: 600 mg via ORAL
  Filled 2014-12-04 (×3): qty 1

## 2014-12-04 MED ORDER — PANTOPRAZOLE SODIUM 40 MG PO TBEC
40.0000 mg | DELAYED_RELEASE_TABLET | Freq: Every day | ORAL | Status: DC
  Administered 2014-12-04: 40 mg via ORAL
  Filled 2014-12-04: qty 1

## 2014-12-04 MED ORDER — TRAMADOL HCL 50 MG PO TABS
50.0000 mg | ORAL_TABLET | Freq: Four times a day (QID) | ORAL | Status: DC | PRN
  Administered 2014-12-04: 50 mg via ORAL
  Filled 2014-12-04: qty 1

## 2014-12-04 MED ORDER — KETOROLAC TROMETHAMINE 30 MG/ML IJ SOLN
30.0000 mg | Freq: Once | INTRAMUSCULAR | Status: AC
  Administered 2014-12-04: 30 mg via INTRAVENOUS
  Filled 2014-12-04: qty 1

## 2014-12-04 MED ORDER — OXYCODONE HCL 5 MG PO TABS: 10.0000 mg | ORAL_TABLET | Freq: Four times a day (QID) | ORAL | Status: DC | PRN

## 2014-12-04 MED ORDER — STROKE: EARLY STAGES OF RECOVERY BOOK
Freq: Once | Status: AC
  Administered 2014-12-04: 03:00:00
  Filled 2014-12-04: qty 1

## 2014-12-04 MED ORDER — TAMSULOSIN HCL 0.4 MG PO CAPS
0.4000 mg | ORAL_CAPSULE | Freq: Two times a day (BID) | ORAL | Status: DC
  Administered 2014-12-04: 0.4 mg via ORAL
  Filled 2014-12-04 (×3): qty 1

## 2014-12-04 MED ORDER — ROSUVASTATIN CALCIUM 10 MG PO TABS
10.0000 mg | ORAL_TABLET | Freq: Every day | ORAL | Status: DC
  Administered 2014-12-04: 10 mg via ORAL
  Filled 2014-12-04: qty 1

## 2014-12-04 MED ORDER — CITALOPRAM HYDROBROMIDE 40 MG PO TABS
40.0000 mg | ORAL_TABLET | Freq: Every day | ORAL | Status: DC
  Filled 2014-12-04: qty 1

## 2014-12-04 MED ORDER — INSULIN ASPART 100 UNIT/ML ~~LOC~~ SOLN: 0.0000 [IU] | Freq: Every day | SUBCUTANEOUS | Status: DC

## 2014-12-04 MED ORDER — CYCLOBENZAPRINE HCL 10 MG PO TABS: 10.0000 mg | ORAL_TABLET | Freq: Every day | ORAL | Status: DC

## 2014-12-04 MED ORDER — ASPIRIN 325 MG PO TABS
325.0000 mg | ORAL_TABLET | Freq: Every day | ORAL | Status: DC
  Administered 2014-12-04: 325 mg via ORAL
  Filled 2014-12-04: qty 1

## 2014-12-04 NOTE — Progress Notes (Signed)
Subjective: Patient much improved.  Speech more spontaneous.  Left sided strength improved.    Objective: Current vital signs: BP 106/69 mmHg  Pulse 88  Temp(Src) 97.8 F (36.6 C) (Oral)  Resp 18  Ht 5\' 9"  (1.753 m)  Wt 65.726 kg (144 lb 14.4 oz)  BMI 21.39 kg/m2  SpO2 100% Vital signs in last 24 hours: Temp:  [97.8 F (36.6 C)-99.5 F (37.5 C)] 97.8 F (36.6 C) (06/08 0211) Pulse Rate:  [88-103] 88 (06/08 0211) Resp:  [13-21] 18 (06/08 0211) BP: (106-131)/(67-82) 106/69 mmHg (06/08 0211) SpO2:  [98 %-100 %] 100 % (06/08 0211) Weight:  [65.726 kg (144 lb 14.4 oz)-68.04 kg (150 lb)] 65.726 kg (144 lb 14.4 oz) (06/08 0028)  Intake/Output from previous day: 06/07 0701 - 06/08 0700 In: 272.5 [I.V.:272.5] Out: 600 [Urine:600] Intake/Output this shift:   Nutritional status: Diet Carb Modified Fluid consistency:: Thin; Room service appropriate?: Yes  Neurologic Exam: Mental Status: Alert, oriented, thought content appropriate.  Speech fluent without evidence of aphasia.  Able to follow 3 step commands without difficulty. Cranial Nerves: II: Discs flat bilaterally; Visual fields grossly normal, pupils equal, round, reactive to light and accommodation III,IV, VI: ptosis not present, extra-ocular motions intact bilaterally V,VII: smile symmetric, facial light touch sensation normal bilaterally VIII: hearing normal bilaterally IX,X: gag reflex present XI: bilateral shoulder shrug XII: midline tongue extension Motor: Able to lift all extremities against gravity.  Gives very little effort for the lower extremities but no focal weakness noted.  No upper extremity drift.  5-/5 left hand grip.   Deep Tendon Reflexes: 2+ and symmetric throughout Plantars: Right: downgoing   Left: downgoing   Lab Results: Basic Metabolic Panel:  Recent Labs Lab 12/03/14 1545 12/03/14 1600  NA 135 137  K 4.9 4.9  CL 106 107  CO2 19*  --   GLUCOSE 164* 162*  BUN 11 10  CREATININE 1.15  1.30*  CALCIUM 9.3  --     Liver Function Tests:  Recent Labs Lab 12/03/14 1545  AST 43*  ALT 30  ALKPHOS 129*  BILITOT 0.5  PROT 7.3  ALBUMIN 4.3   No results for input(s): LIPASE, AMYLASE in the last 168 hours.  Recent Labs Lab 12/03/14 1545  AMMONIA 59*    CBC:  Recent Labs Lab 12/03/14 1545 12/03/14 1600  WBC 4.0  --   NEUTROABS 3.0  --   HGB 10.9* 11.9*  HCT 34.8* 35.0*  MCV 96.4  --   PLT 106*  --     Cardiac Enzymes:  Recent Labs Lab 12/04/14 0533 12/04/14 0852  TROPONINI <0.03 <0.03    Lipid Panel:  Recent Labs Lab 12/04/14 0533  CHOL 99  TRIG 146  HDL 26*  CHOLHDL 3.8  VLDL 29  LDLCALC 44    CBG:  Recent Labs Lab 12/04/14 0120 12/04/14 0749  GLUCAP 134* 145*    Microbiology: Results for orders placed or performed during the hospital encounter of 01/16/14  KOH prep     Status: None   Collection Time: 01/16/14  9:06 AM  Result Value Ref Range Status   Specimen Description ESOPHAGUS BRUSHING  Final   Special Requests NONE  Final   KOH Prep   Final    YEAST WITH PSEUDOHYPHAE Performed at [facility]   Report Status 01/16/2014 FINAL  Final    Coagulation Studies:  Recent Labs  12/03/14 1545  LABPROT 15.5*  INR 1.22    Imaging: Ct Angio Head W/cm &/or  Wo Cm  12/03/2014   CLINICAL DATA:  Stroke. Left-sided weakness with facial droop and difficulty speaking today  EXAM: CT ANGIOGRAPHY HEAD AND NECK  TECHNIQUE: Multidetector CT imaging of the head and neck was performed using the standard protocol during bolus administration of intravenous contrast. Multiplanar CT image reconstructions and MIPs were obtained to evaluate the vascular anatomy. Carotid stenosis measurements (when applicable) are obtained utilizing NASCET criteria, using the distal internal carotid diameter as the denominator.  CONTRAST:  89mL OMNIPAQUE IOHEXOL 350 MG/ML SOLN  COMPARISON:  CT head 12/03/2014  FINDINGS: CT HEAD  Brain: Small hyperintensity  anterior limb right internal capsule consistent with chronic infarction or perivascular space. This was present on the prior MRI. No acute infarct. Negative for hemorrhage or mass. Ventricle size normal.  Calvarium and skull base: Negative  Paranasal sinuses: Mild mucosal edema left maxillary sinus. Remaining sinuses clear.  Orbits: Negative  CTA NECK  Aortic arch: Normal aortic arch. Proximal great vessels widely patent. Three vessel aortic arch noted. Lung apices are clear.  Right carotid system: Right carotid artery widely patent. Atherosclerotic stent carotid bifurcation widely patent without atherosclerotic disease. Negative for dissection.  Left carotid system: Left carotid artery widely patent. Left carotid bifurcation is widely patent without atherosclerotic disease. No dissection.  Vertebral arteries:Both vertebral arteries are widely patent to the basilar without atherosclerotic disease or dissection.  Skeleton: Cervical disc degeneration and spondylosis C4-5 and C5-6 and C6-7. No acute bone lesion.  Other neck: 8 mm lower pole thyroid nodule left. 6 mm upper pole thyroid nodules on the left. No cervical adenopathy.  CTA HEAD  Anterior circulation: Mild atherosclerotic disease in the cavernous carotid bilaterally without significant stenosis. Anterior and middle cerebral arteries patent bilaterally. Hypoplastic left A1 segment appears congenital.  Posterior circulation: Both vertebral arteries are patent to the basilar without significant stenosis. PICA patent bilaterally. Basilar widely patent. Superior cerebellar and posterior cerebral arteries patent bilaterally.  Venous sinuses: Patent  Anatomic variants: Negative for cerebral aneurysm.  Delayed phase: Normal enhancement following contrast infusion  IMPRESSION: No significant carotid or vertebral artery stenosis. Negative for dissection.  No significant intracranial stenosis.   Electronically Signed   By: Franchot Gallo M.D.   On: 12/03/2014 18:40    Ct Head Wo Contrast  12/03/2014   CLINICAL DATA:  69 year old male code stroke. Left side weakness and loss of speech since 1505 hrs. Initial encounter.  EXAM: CT HEAD WITHOUT CONTRAST  TECHNIQUE: Contiguous axial images were obtained from the base of the skull through the vertex without intravenous contrast.  COMPARISON:  Brain MRI 02/16/2013.  Head CT 01/27/2013.  FINDINGS: Partially visible globular opacity in the left maxillary sinus appears to be inflammatory. Other Visualized paranasal sinuses and mastoids are clear. No acute osseous abnormality identified.  Visualized orbits and scalp soft tissues are within normal limits.  Cerebral volume remains normal for age. Mild Calcified atherosclerosis at the skull base. Mildly dominant distal left vertebral artery Re identified. No ventriculomegaly. 6 mm oval hypodense area in the right caudate near anterior limb of the right internal capsule (series 2, image 16) is new from the prior CT. Small perivascular spaces in this region on the prior MRI. No mass effect or hemorrhage. No superimposed acute cortically based infarct identified. No suspicious intracranial vascular hyperdensity. No acute intracranial hemorrhage identified. No midline shift, mass effect, or evidence of intracranial mass lesion.  IMPRESSION: 1. Age indeterminate lacunar infarct of the right basal ganglia near the anterior limb of the internal  capsule, suspicious for acute small vessel ischemia in this setting. 2. Otherwise stable and negative for age noncontrast CT appearance of the brain. 3. Study discussed by telephone with Dr. Broadus John ZAMMIT on 12/03/2014 at 15:59 .   Electronically Signed   By: Genevie Ann M.D.   On: 12/03/2014 16:00   Ct Angio Neck W/cm &/or Wo/cm  12/03/2014   CLINICAL DATA:  Stroke. Left-sided weakness with facial droop and difficulty speaking today  EXAM: CT ANGIOGRAPHY HEAD AND NECK  TECHNIQUE: Multidetector CT imaging of the head and neck was performed using the standard  protocol during bolus administration of intravenous contrast. Multiplanar CT image reconstructions and MIPs were obtained to evaluate the vascular anatomy. Carotid stenosis measurements (when applicable) are obtained utilizing NASCET criteria, using the distal internal carotid diameter as the denominator.  CONTRAST:  42mL OMNIPAQUE IOHEXOL 350 MG/ML SOLN  COMPARISON:  CT head 12/03/2014  FINDINGS: CT HEAD  Brain: Small hyperintensity anterior limb right internal capsule consistent with chronic infarction or perivascular space. This was present on the prior MRI. No acute infarct. Negative for hemorrhage or mass. Ventricle size normal.  Calvarium and skull base: Negative  Paranasal sinuses: Mild mucosal edema left maxillary sinus. Remaining sinuses clear.  Orbits: Negative  CTA NECK  Aortic arch: Normal aortic arch. Proximal great vessels widely patent. Three vessel aortic arch noted. Lung apices are clear.  Right carotid system: Right carotid artery widely patent. Atherosclerotic stent carotid bifurcation widely patent without atherosclerotic disease. Negative for dissection.  Left carotid system: Left carotid artery widely patent. Left carotid bifurcation is widely patent without atherosclerotic disease. No dissection.  Vertebral arteries:Both vertebral arteries are widely patent to the basilar without atherosclerotic disease or dissection.  Skeleton: Cervical disc degeneration and spondylosis C4-5 and C5-6 and C6-7. No acute bone lesion.  Other neck: 8 mm lower pole thyroid nodule left. 6 mm upper pole thyroid nodules on the left. No cervical adenopathy.  CTA HEAD  Anterior circulation: Mild atherosclerotic disease in the cavernous carotid bilaterally without significant stenosis. Anterior and middle cerebral arteries patent bilaterally. Hypoplastic left A1 segment appears congenital.  Posterior circulation: Both vertebral arteries are patent to the basilar without significant stenosis. PICA patent bilaterally.  Basilar widely patent. Superior cerebellar and posterior cerebral arteries patent bilaterally.  Venous sinuses: Patent  Anatomic variants: Negative for cerebral aneurysm.  Delayed phase: Normal enhancement following contrast infusion  IMPRESSION: No significant carotid or vertebral artery stenosis. Negative for dissection.  No significant intracranial stenosis.   Electronically Signed   By: Franchot Gallo M.D.   On: 12/03/2014 18:40   Mr Brain Wo Contrast  12/04/2014   CLINICAL DATA:  Initial evaluation for acute onset left-sided weakness and numbness with speech difficulty.  EXAM: MRI HEAD WITHOUT CONTRAST  TECHNIQUE: Multiplanar, multiecho pulse sequences of the brain and surrounding structures were obtained without intravenous contrast.  COMPARISON:  Prior CT from earlier the same day.  FINDINGS: Mild diffuse prominence of the CSF containing spaces is compatible with generalized cerebral atrophy. Very mild chronic small vessel ischemic changes present within the periventricular and deep white matter both cerebral hemispheres. Probable tiny remote lacunar infarct within the right thalamus.  No abnormal foci of restricted diffusion to suggest acute intracranial infarct. Gray-white matter differentiation maintained. Normal intravascular flow voids are preserved. No acute or chronic intracranial hemorrhage.  No mass lesion or midline shift. No mass effect. Ventricles are normal in size without evidence of hydrocephalus. No extra-axial fluid collection.  Craniocervical junction within normal  limits. Pituitary gland normal.  No acute abnormality about the orbits.  Mild mucosal thickening present within the left maxillary sinus. Paranasal sinuses are otherwise clear.  Right mastoid effusion noted.  Inner ear structures normal.  Bone marrow signal intensity within normal limits. Scalp soft tissues are unremarkable.  IMPRESSION: 1. No acute intracranial infarct or other process identified. 2. 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 3. Right mastoid effusion.   Electronically Signed   By: Jeannine Boga M.D.   On: 12/04/2014 00:22    Medications:  I have reviewed the patient's current medications. Scheduled: . aspirin  325 mg Oral Daily  . citalopram  40 mg Oral QHS  . cyclobenzaprine  10 mg Oral QHS  . folic acid  1 mg Oral Daily  . gemfibrozil  600 mg Oral BID AC  . insulin aspart  0-5 Units Subcutaneous QHS  . insulin aspart  0-9 Units Subcutaneous TID WC  . lactulose  30 g Oral Daily  . metoCLOPramide (REGLAN) injection  5 mg Intravenous 3 times per day  . nortriptyline  50 mg Oral QHS  . pantoprazole  40 mg Oral Daily  . [START ON 12/05/2014] pneumococcal 23 valent vaccine  0.5 mL Intramuscular Tomorrow-1000  . propranolol  20 mg Oral BID  . rosuvastatin  10 mg Oral Daily  . tamsulosin  0.4 mg Oral BID  . topiramate  200 mg Oral QHS    Assessment/Plan: Patient improved today.  MRI of the brain personally reviewed and shows no acute changes. Echocardiogram pending.    Recommendations: 1.  Continue ASA 2.  PT and OT to evaluate.      Alexis Goodell, MD [facility] [phone] 12/04/2014  10:06 AM

## 2014-12-04 NOTE — Progress Notes (Deleted)
  Echocardiogram 2D Echocardiogram has been performed.  Brad Wall 12/04/2014, 1:52 PM

## 2014-12-04 NOTE — Telephone Encounter (Signed)
Noted  

## 2014-12-04 NOTE — Discharge Summary (Signed)
Physician Discharge Summary  Brad Wall SHF:[id number] DOB: 1946/01/01 DOA: 12/03/2014  PCP: [name], MD  Admit date: 12/03/2014 Discharge date: 12/04/2014  Time spent: 35 minutes  Recommendations for Outpatient Follow-up:  1. Please follow-up on history of migraine headaches, patient reporting having recurrent migraine headaches     Discharge Diagnoses:  Active Problems:   DM type 2 causing neurological disease, not at goal   Migraine   Thrombocytopenia   Liver cirrhosis secondary to NASH   TIA (transient ischemic attack)   Discharge Condition: Stable   Diet recommendation: heart healthy, low-sodium diet   Filed Weights   12/03/14 1558 12/04/14 0028  Weight: 68.04 kg (150 lb) 65.726 kg (144 lb 14.4 oz)    History of present illness:  Brad Wall is a 69 y.o. male   has a past medical history of Diabetes mellitus; Hypertension; Mediterranean fever; Stroke; Compressed vertebrae; Chronic back pain greater than 3 months duration; GERD (gastroesophageal reflux disease); S/P colonoscopy (2010); Hyperlipemia; Anxiety; Depression; NASH (nonalcoholic steatohepatitis); Varices, esophageal; Migraine; Thrombocytopenia (02/04/2012); Iron deficiency anemia, unspecified (11/09/2012); Cyst of pancreas; Cirrhosis; Small intestinal bacterial overgrowth; and Chronic pain (08/09/2014).   Today at 3 pm he developed left facial droop and left side weakness. Patient was unable to speak. Not able to walk. He has history of migraines and had been having severe headache. 911 was called and he as taken to [facility] where a CT scan was done Raquel Sarna was told that he possibly had a stroke and may have a blood clot in his brain and was sent to emergency to [facility]. CT scan and CTA of the brain was done that showed no evidence of CVA or stenosis or embolizm. Per neurology he is not a tPA candidate. Has has history of prior stroke with no residual deficits. Her neurology most likely symptoms  explained by complex migraine they would recommend TIA workup A shin has known history of liver disease due to [facility] with known thrombocytopenia. Has been stable.  Hospital Course:  Patient is a 69 year old gentleman with a past medical history of diabetes mellitus, hypertension, history of CVA, admitted to medicine service on 12/03/2014 when he presented with complaints of left-sided facial droop and left-sided weakness. He was not considered to be a TPA candidate due to coagulopathy. Initial workup included a head CT which showed age indeterminate right basal ganglia infarct. He was seen and evaluated by neurology. He was further worked up with an MRI of brain that did not reveal acute intracranial infarct or other process identified. As thoracic echocardiogram showed normal EF. By the following day patient reported significant improvement to his symptoms. Symptoms were felt to be secondary to transient ischemic attack. Case discussed with neurology on 12/04/2014. Given improvement to neurologic status with negative MRI he was discharged it was home in stable condition on 12/04/2014.   Consultations:  Neurology   Discharge Exam: Filed Vitals:   12/04/14 1420  BP: 116/63  Pulse: 87  Temp: 98.4 F (36.9 C)  Resp: 16    General: patient is awake, alert, no acute distress, states feeling much better Cardiovascular: regular rate and rhythm normal S1-S2 Respiratory: normal respiratory effort, lungs are clear to auscultation bilaterally  Abdomen: Soft nontender nontender positive bowel sounds  Extremities: No edema Neurological: No evidence of facial droop or slurred speech. Has 4-5 muscle strength to left upper extremity, 5 of 5 muscle strength to right upper extremity  Discharge Instructions   Discharge Instructions    Call  MD for:  difficulty breathing, headache or visual disturbances    Complete by:  As directed      Call MD for:  extreme fatigue    Complete by:  As directed       Call MD for:  hives    Complete by:  As directed      Call MD for:  persistant dizziness or light-headedness    Complete by:  As directed      Call MD for:  persistant nausea and vomiting    Complete by:  As directed      Call MD for:  redness, tenderness, or signs of infection (pain, swelling, redness, odor or green/yellow discharge around incision site)    Complete by:  As directed      Call MD for:  severe uncontrolled pain    Complete by:  As directed      Call MD for:  temperature >100.4    Complete by:  As directed      Call MD for:    Complete by:  As directed      Diet - low sodium heart healthy    Complete by:  As directed      Increase activity slowly    Complete by:  As directed           Current Discharge Medication List    CONTINUE these medications which have NOT CHANGED   Details  aspirin EC 81 MG tablet Take 81 mg by mouth every morning.     butalbital-acetaminophen-caffeine (FIORICET WITH CODEINE) 50-325-40-30 MG per capsule Take 1 capsule by mouth every 4 (four) hours as needed for headache.    citalopram (CELEXA) 40 MG tablet Take 40 mg by mouth at bedtime.    Associated Diagnoses: Thrombocytopenia    cyclobenzaprine (FLEXERIL) 10 MG tablet Take 10 mg by mouth at bedtime.     esomeprazole (NEXIUM) 20 MG capsule Take 1 capsule (20 mg total) by mouth 2 (two) times daily before a meal. Qty: 60 capsule, Refills: 6    folic acid (FOLVITE) 1 MG tablet Take 1 tablet (1 mg total) by mouth daily. Qty: 30 tablet, Refills: 5   Associated Diagnoses: Folate deficiency    gemfibrozil (LOPID) 600 MG tablet Take 600 mg by mouth 2 (two) times daily before a meal.     Associated Diagnoses: Thrombocytopenia    lactulose (CHRONULAC) 10 GM/15ML solution TAKE 30 MLS BY MOUTH TWICE DAILY. Qty: 1892 mL, Refills: 5    LORazepam (ATIVAN) 1 MG tablet Take 1 mg by mouth at bedtime as needed. Only takes for extreme anxiety or if unable to go to sleep   Associated Diagnoses:  Thrombocytopenia    metFORMIN (GLUCOPHAGE) 500 MG tablet Take 1,000 mg by mouth 2 (two) times daily with a meal.    Multiple Vitamin (MULTIVITAMIN WITH MINERALS) TABS tablet Take 1 tablet by mouth daily.    nortriptyline (PAMELOR) 50 MG capsule Take 50 mg by mouth at bedtime.    oxyCODONE (ROXICODONE) 15 MG immediate release tablet Take 20 mg by mouth every 6 (six) hours as needed for pain.    Probiotic Product (PROBIOTIC DAILY PO) Take 1 tablet by mouth daily.     propranolol (INDERAL) 20 MG tablet TAKE ONE TABLET BY MOUTH 2 TIMES A DAY. Qty: 60 tablet, Refills: 5    Pseudoephedrine-Acetaminophen (DAYTIME SINUS RELIEF PO) Take 1 capsule by mouth daily as needed (for sinus relief).    rosuvastatin (CRESTOR) 10 MG tablet Take  10 mg by mouth daily.     Tamsulosin HCl (FLOMAX) 0.4 MG CAPS Take 0.4 mg by mouth 2 (two) times daily.    topiramate (TOPAMAX) 200 MG tablet Take 200 mg by mouth at bedtime.    insulin regular (NOVOLIN R,HUMULIN R) 100 units/mL injection Inject 3-7 Units into the skin 3 (three) times daily before meals. Given according to sliding scale at home.    polyethylene glycol-electrolytes (TRILYTE) 420 G solution Take 4,000 mLs by mouth as directed. Qty: 4000 mL, Refills: 0      STOP taking these medications     B-D INS SYRINGE 0.5CC/31GX5/16 31G X 5/16" 0.5 ML MISC        Allergies  Allergen Reactions  . Penicillins Hives and Itching  . Lyrica [Pregabalin] Other (See Comments)    Irritable, mood swings  . Neurontin [Gabapentin] Other (See Comments)    irritability   Follow-up Information    Follow up with [name], MD In 2 weeks.   Specialty:  Family Medicine   Contact information:   [address] [place] [id number] [phone]        The results of significant diagnostics from this hospitalization (including imaging, microbiology, ancillary and laboratory) are listed below for reference.    Significant Diagnostic Studies: Ct  Angio Head W/cm &/or Wo Cm  12/03/2014   CLINICAL DATA:  Stroke. Left-sided weakness with facial droop and difficulty speaking today  EXAM: CT ANGIOGRAPHY HEAD AND NECK  TECHNIQUE: Multidetector CT imaging of the head and neck was performed using the standard protocol during bolus administration of intravenous contrast. Multiplanar CT image reconstructions and MIPs were obtained to evaluate the vascular anatomy. Carotid stenosis measurements (when applicable) are obtained utilizing NASCET criteria, using the distal internal carotid diameter as the denominator.  CONTRAST:  52mL OMNIPAQUE IOHEXOL 350 MG/ML SOLN  COMPARISON:  CT head 12/03/2014  FINDINGS: CT HEAD  Brain: Small hyperintensity anterior limb right internal capsule consistent with chronic infarction or perivascular space. This was present on the prior MRI. No acute infarct. Negative for hemorrhage or mass. Ventricle size normal.  Calvarium and skull base: Negative  Paranasal sinuses: Mild mucosal edema left maxillary sinus. Remaining sinuses clear.  Orbits: Negative  CTA NECK  Aortic arch: Normal aortic arch. Proximal great vessels widely patent. Three vessel aortic arch noted. Lung apices are clear.  Right carotid system: Right carotid artery widely patent. Atherosclerotic stent carotid bifurcation widely patent without atherosclerotic disease. Negative for dissection.  Left carotid system: Left carotid artery widely patent. Left carotid bifurcation is widely patent without atherosclerotic disease. No dissection.  Vertebral arteries:Both vertebral arteries are widely patent to the basilar without atherosclerotic disease or dissection.  Skeleton: Cervical disc degeneration and spondylosis C4-5 and C5-6 and C6-7. No acute bone lesion.  Other neck: 8 mm lower pole thyroid nodule left. 6 mm upper pole thyroid nodules on the left. No cervical adenopathy.  CTA HEAD  Anterior circulation: Mild atherosclerotic disease in the cavernous carotid bilaterally without  significant stenosis. Anterior and middle cerebral arteries patent bilaterally. Hypoplastic left A1 segment appears congenital.  Posterior circulation: Both vertebral arteries are patent to the basilar without significant stenosis. PICA patent bilaterally. Basilar widely patent. Superior cerebellar and posterior cerebral arteries patent bilaterally.  Venous sinuses: Patent  Anatomic variants: Negative for cerebral aneurysm.  Delayed phase: Normal enhancement following contrast infusion  IMPRESSION: No significant carotid or vertebral artery stenosis. Negative for dissection.  No significant intracranial stenosis.   Electronically Signed  By: Franchot Gallo M.D.   On: 12/03/2014 18:40   Ct Head Wo Contrast  12/03/2014   CLINICAL DATA:  69 year old male code stroke. Left side weakness and loss of speech since 1505 hrs. Initial encounter.  EXAM: CT HEAD WITHOUT CONTRAST  TECHNIQUE: Contiguous axial images were obtained from the base of the skull through the vertex without intravenous contrast.  COMPARISON:  Brain MRI 02/16/2013.  Head CT 01/27/2013.  FINDINGS: Partially visible globular opacity in the left maxillary sinus appears to be inflammatory. Other Visualized paranasal sinuses and mastoids are clear. No acute osseous abnormality identified.  Visualized orbits and scalp soft tissues are within normal limits.  Cerebral volume remains normal for age. Mild Calcified atherosclerosis at the skull base. Mildly dominant distal left vertebral artery Re identified. No ventriculomegaly. 6 mm oval hypodense area in the right caudate near anterior limb of the right internal capsule (series 2, image 16) is new from the prior CT. Small perivascular spaces in this region on the prior MRI. No mass effect or hemorrhage. No superimposed acute cortically based infarct identified. No suspicious intracranial vascular hyperdensity. No acute intracranial hemorrhage identified. No midline shift, mass effect, or evidence of  intracranial mass lesion.  IMPRESSION: 1. Age indeterminate lacunar infarct of the right basal ganglia near the anterior limb of the internal capsule, suspicious for acute small vessel ischemia in this setting. 2. Otherwise stable and negative for age noncontrast CT appearance of the brain. 3. Study discussed by telephone with Dr. Broadus John ZAMMIT on 12/03/2014 at 15:59 .   Electronically Signed   By: Genevie Ann M.D.   On: 12/03/2014 16:00   Ct Angio Neck W/cm &/or Wo/cm  12/03/2014   CLINICAL DATA:  Stroke. Left-sided weakness with facial droop and difficulty speaking today  EXAM: CT ANGIOGRAPHY HEAD AND NECK  TECHNIQUE: Multidetector CT imaging of the head and neck was performed using the standard protocol during bolus administration of intravenous contrast. Multiplanar CT image reconstructions and MIPs were obtained to evaluate the vascular anatomy. Carotid stenosis measurements (when applicable) are obtained utilizing NASCET criteria, using the distal internal carotid diameter as the denominator.  CONTRAST:  15mL OMNIPAQUE IOHEXOL 350 MG/ML SOLN  COMPARISON:  CT head 12/03/2014  FINDINGS: CT HEAD  Brain: Small hyperintensity anterior limb right internal capsule consistent with chronic infarction or perivascular space. This was present on the prior MRI. No acute infarct. Negative for hemorrhage or mass. Ventricle size normal.  Calvarium and skull base: Negative  Paranasal sinuses: Mild mucosal edema left maxillary sinus. Remaining sinuses clear.  Orbits: Negative  CTA NECK  Aortic arch: Normal aortic arch. Proximal great vessels widely patent. Three vessel aortic arch noted. Lung apices are clear.  Right carotid system: Right carotid artery widely patent. Atherosclerotic stent carotid bifurcation widely patent without atherosclerotic disease. Negative for dissection.  Left carotid system: Left carotid artery widely patent. Left carotid bifurcation is widely patent without atherosclerotic disease. No dissection.   Vertebral arteries:Both vertebral arteries are widely patent to the basilar without atherosclerotic disease or dissection.  Skeleton: Cervical disc degeneration and spondylosis C4-5 and C5-6 and C6-7. No acute bone lesion.  Other neck: 8 mm lower pole thyroid nodule left. 6 mm upper pole thyroid nodules on the left. No cervical adenopathy.  CTA HEAD  Anterior circulation: Mild atherosclerotic disease in the cavernous carotid bilaterally without significant stenosis. Anterior and middle cerebral arteries patent bilaterally. Hypoplastic left A1 segment appears congenital.  Posterior circulation: Both vertebral arteries are patent to the basilar  without significant stenosis. PICA patent bilaterally. Basilar widely patent. Superior cerebellar and posterior cerebral arteries patent bilaterally.  Venous sinuses: Patent  Anatomic variants: Negative for cerebral aneurysm.  Delayed phase: Normal enhancement following contrast infusion  IMPRESSION: No significant carotid or vertebral artery stenosis. Negative for dissection.  No significant intracranial stenosis.   Electronically Signed   By: Franchot Gallo M.D.   On: 12/03/2014 18:40   Mr Brain Wo Contrast  12/04/2014   CLINICAL DATA:  Initial evaluation for acute onset left-sided weakness and numbness with speech difficulty.  EXAM: MRI HEAD WITHOUT CONTRAST  TECHNIQUE: Multiplanar, multiecho pulse sequences of the brain and surrounding structures were obtained without intravenous contrast.  COMPARISON:  Prior CT from earlier the same day.  FINDINGS: Mild diffuse prominence of the CSF containing spaces is compatible with generalized cerebral atrophy. Very mild chronic small vessel ischemic changes present within the periventricular and deep white matter both cerebral hemispheres. Probable tiny remote lacunar infarct within the right thalamus.  No abnormal foci of restricted diffusion to suggest acute intracranial infarct. Gray-white matter differentiation maintained.  Normal intravascular flow voids are preserved. No acute or chronic intracranial hemorrhage.  No mass lesion or midline shift. No mass effect. Ventricles are normal in size without evidence of hydrocephalus. No extra-axial fluid collection.  Craniocervical junction within normal limits. Pituitary gland normal.  No acute abnormality about the orbits.  Mild mucosal thickening present within the left maxillary sinus. Paranasal sinuses are otherwise clear.  Right mastoid effusion noted.  Inner ear structures normal.  Bone marrow signal intensity within normal limits. Scalp soft tissues are unremarkable.  IMPRESSION: 1. No acute intracranial infarct or other process identified. 2. Mild cerebral atrophy with chronic microvascular ischemic disease. 3. Right mastoid effusion.   Electronically Signed   By: Jeannine Boga M.D.   On: 12/04/2014 00:22   Ct Abdomen Pelvis W Contrast  11/19/2014   CLINICAL DATA:  Upper mid abdominal pain for 1 year worsening over the past 6 months. Diabetes. Hypertension. Splenomegaly.  EXAM: CT ABDOMEN AND PELVIS WITH CONTRAST  TECHNIQUE: Multidetector CT imaging of the abdomen and pelvis was performed using the standard protocol following bolus administration of intravenous contrast.  CONTRAST:  133mL OMNIPAQUE IOHEXOL 300 MG/ML  SOLN  COMPARISON:  Multiple exams, including 09/26/2014  FINDINGS: Lower chest:  Uphill paraesophageal varices.  Hepatobiliary: Hepatic cirrhosis with nodular contour. No worrisome enhancing mass in the liver.  Pancreas: Unremarkable  Spleen: Splenomegaly, splenic volume 910 cc. Small hypodensities favoring siderotic nodules.  Adrenals/Urinary Tract: 1-2 mm right kidney upper pole nonobstructive calculus, image 73 series 5. Similar 2 mm nonobstructive calculus in the left kidney lower pole, image 69 series 5.  Stomach/Bowel: Prominent stool throughout the colon favors constipation. Sigmoid diverticulosis.  Vascular/Lymphatic: Aortoiliac atherosclerotic vascular  disease. Chronic stranding in the mesenteric root and retroperitoneum.  Reproductive: Mildly enlarged prostate gland, 5.0 by 4.0 cm on image 79 series 2.  Other: Presacral edema, similar to 03/22/14, nonspecific. This extends to some degree in the perirectal space, as before. Small amount of perihepatic and perisplenic ascites.  Musculoskeletal: Mild degenerative arthropathy of both hips. Mild lumbar degenerative disc disease and spondylosis without observed impingement.  IMPRESSION: 1. Cirrhosis with mild upper abdominal ascites and uphill paraesophageal varices. 2. Splenomegaly, splenic volume 910 cc, with siderotic nodules. 3. Bilateral nonobstructive nephrolithiasis. 4.  Prominent stool throughout the colon favors constipation. 5. Other findings include sigmoid diverticulosis ; atherosclerosis; chronic stranding in the mesenteric root, retroperitoneum, and presacral space; mildly enlarged  prostate gland; and mild degenerative findings in both hips and in the lumbar spine.   Electronically Signed   By: Van Clines M.D.   On: 11/19/2014 17:02    Microbiology: No results found for this or any previous visit (from the past 240 hour(s)).   Labs: Basic Metabolic Panel:  Recent Labs Lab 12/03/14 1545 12/03/14 1600  NA 135 137  K 4.9 4.9  CL 106 107  CO2 19*  --   GLUCOSE 164* 162*  BUN 11 10  CREATININE 1.15 1.30*  CALCIUM 9.3  --    Liver Function Tests:  Recent Labs Lab 12/03/14 1545  AST 43*  ALT 30  ALKPHOS 129*  BILITOT 0.5  PROT 7.3  ALBUMIN 4.3   No results for input(s): LIPASE, AMYLASE in the last 168 hours.  Recent Labs Lab 12/03/14 1545  AMMONIA 59*   CBC:  Recent Labs Lab 12/03/14 1545 12/03/14 1600  WBC 4.0  --   NEUTROABS 3.0  --   HGB 10.9* 11.9*  HCT 34.8* 35.0*  MCV 96.4  --   PLT 106*  --    Cardiac Enzymes:  Recent Labs Lab 12/04/14 0533 12/04/14 0852 12/04/14 1430  TROPONINI <0.03 <0.03 <0.03   BNP: BNP (last 3 results) No results  for input(s): BNP in the last 8760 hours.  ProBNP (last 3 results) No results for input(s): PROBNP in the last 8760 hours.  CBG:  Recent Labs Lab 12/04/14 0120 12/04/14 0749 12/04/14 1232 12/04/14 1642  GLUCAP 134* 145* 236* 113*       Signed:  Kelvin Cellar  [facility] 12/04/2014, 5:26 PM

## 2014-12-04 NOTE — Progress Notes (Signed)
Pt received discharge orders and education, All questions addressed, vitals stable. Telemetry and IV dc'd. Home with family 12/04/2014 6:22 PM. Robet Leu

## 2014-12-04 NOTE — Evaluation (Addendum)
Physical Therapy Evaluation and Discharge Patient Details Name: Brad Wall MRN: [id number] DOB: Oct 31, 1945 Today's Date: 12/04/2014   History of Present Illness  69 y.o. male admitted for TIA. Hx of past medical history of Diabetes mellitus; Hypertension; Mediterranean fever; Stroke; Compressed vertebrae; Chronic back pain greater than 3 months duration; GERD (gastroesophageal reflux disease); S/P colonoscopy (2010); Hyperlipemia; Anxiety; Depression; NASH (nonalcoholic steatohepatitis); Varices, esophageal; Migraine; Thrombocytopenia (02/04/2012); Iron deficiency anemia, unspecified (11/09/2012); Cyst of pancreas; Cirrhosis; Small intestinal bacterial overgrowth; and Chronic pain (08/09/2014).  Clinical Impression  Patient evaluated by Physical Therapy with no further acute PT needs identified. All education has been completed and the patient has no further questions. Ambulates generally well up to 265 feet with supervision for safety. Balance improved by use of a rolling walker for support but did tolerate short distance without use of an assistive device. Safely completed stair training without physical assist. Good family support. See below for any follow-up Physial Therapy or equipment needs. PT is signing off. Thank you for this referral.     Follow Up Recommendations No PT follow up    Equipment Recommendations  None recommended by PT    Recommendations for Other Services       Precautions / Restrictions Precautions Precautions: None Restrictions Weight Bearing Restrictions: No      Mobility  Bed Mobility Overal bed mobility: Modified Independent             General bed mobility comments: extra time  Transfers Overall transfer level: Needs assistance Equipment used: Rolling walker (2 wheeled) Transfers: Sit to/from Stand Sit to Stand: Supervision         General transfer comment: supervision for safety. VC for hand placement. Fair balance upon standing although  relies on RW for confidence.  Ambulation/Gait Ambulation/Gait assistance: Supervision Ambulation Distance (Feet): 265 Feet Assistive device: Rolling walker (2 wheeled);None Gait Pattern/deviations: Step-through pattern;Decreased stride length;Decreased step length - right;Decreased stance time - left;Ataxic;Trunk flexed Gait velocity: decreased   General Gait Details: Mildy antalgic and ataxic gait pattern favoring RLE. Initially heavy use of RW for UE support but progressed to ambulating short distance (20 feet) without RW. Gait improves with RW for stability. No over loss of balance noted during bout. Cues for upright posture.  Stairs Stairs: Yes Stairs assistance: Supervision Stair Management: One rail Right;Forwards;Alternating pattern;Step to pattern Number of Stairs: 3 General stair comments: Practiced stair navigation. VC for sequencing. Prefers alternating pattern to ascend, and step-to pattern for descent. No loss of balance. Feels confident with this task  Wheelchair Mobility    Modified Rankin (Stroke Patients Only) Modified Rankin (Stroke Patients Only) Pre-Morbid Rankin Score: Slight disability Modified Rankin: Moderately severe disability     Balance Overall balance assessment: Needs assistance Sitting-balance support: No upper extremity supported;Feet supported Sitting balance-Leahy Scale: Good     Standing balance support: No upper extremity supported Standing balance-Leahy Scale: Good                               Pertinent Vitals/Pain Pain Assessment: 0-10 Pain Score: 8  Pain Location: headache ("cluster headache for 6 years") Pain Descriptors / Indicators: Aching Pain Intervention(s): Monitored during session;Repositioned    Home Living Family/patient expects to be discharged to:: Private residence Living Arrangements: Spouse/significant other Available Help at Discharge: Family;Available 24 hours/day Type of Home: House Home Access:  Stairs to enter Entrance Stairs-Rails: Psychiatric nurse of Steps: 3 Home Layout: One level Home  Equipment: Gilford Rile - 2 wheels;Cane - single point      Prior Function Level of Independence: Independent with assistive device(s)         Comments: used cane when ambulating outdoors     Hand Dominance   Dominant Hand: Right    Extremity/Trunk Assessment   Upper Extremity Assessment: Defer to OT evaluation           Lower Extremity Assessment: LLE deficits/detail   LLE Deficits / Details: Decreased strength grossly, however pt reports baseline weakness due to back injury. Ataxic movement with heel to shin test     Communication   Communication: Expressive difficulties  Cognition Arousal/Alertness: Awake/alert Behavior During Therapy: WFL for tasks assessed/performed Overall Cognitive Status: Within Functional Limits for tasks assessed                      General Comments General comments (skin integrity, edema, etc.): supportive family in room. States he feels slightly weak but very near to baseline level of function. Reviewed modifiable risk factors for stroke including FAST acronym.    Exercises        Assessment/Plan    PT Assessment Patent does not need any further PT services  PT Diagnosis Difficulty walking;Abnormality of gait;Hemiplegia non-dominant side   PT Problem List    PT Treatment Interventions     PT Goals (Current goals can be found in the Care Plan section) Acute Rehab PT Goals Patient Stated Goal: go home PT Goal Formulation: All assessment and education complete, DC therapy    Frequency     Barriers to discharge        Co-evaluation               End of Session Equipment Utilized During Treatment: Gait belt Activity Tolerance: Patient tolerated treatment well Patient left: in bed;with call bell/phone within reach;with family/visitor present;with SCD's reapplied Nurse Communication: Mobility status     Functional Assessment Tool Used: clinical observation Functional Limitation: Mobility: Walking and moving around Mobility: Walking and Moving Around Current Status (Y7062): At least 1 percent but less than 20 percent impaired, limited or restricted Mobility: Walking and Moving Around Goal Status [phone]): At least 1 percent but less than 20 percent impaired, limited or restricted Mobility: Walking and Moving Around Discharge Status [phone]): At least 1 percent but less than 20 percent impaired, limited or restricted    Time: 6160-7371 PT Time Calculation (min) (ACUTE ONLY): 27 min   Charges:   PT Evaluation $Initial PT Evaluation Tier I: 1 Procedure PT Treatments $Gait Training: 8-22 mins   PT G Codes:   PT G-Codes **NOT FOR INPATIENT CLASS** Functional Assessment Tool Used: clinical observation Functional Limitation: Mobility: Walking and moving around Mobility: Walking and Moving Around Current Status (G6269): At least 1 percent but less than 20 percent impaired, limited or restricted Mobility: Walking and Moving Around Goal Status [phone]): At least 1 percent but less than 20 percent impaired, limited or restricted Mobility: Walking and Moving Around Discharge Status [phone]): At least 1 percent but less than 20 percent impaired, limited or restricted    Ellouise Newer 12/04/2014, [place] PM Camille Bal [place], [place]

## 2014-12-04 NOTE — Telephone Encounter (Signed)
noted 
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
## 2014-12-04 NOTE — Progress Notes (Signed)
  Echocardiogram 2D Echocardiogram has been performed.  Brad Wall 12/04/2014, 2:52 PM

## 2014-12-05 LAB — HEMOGLOBIN A1C
HEMOGLOBIN A1C: 6 % — AB (ref 4.8–5.6)
Mean Plasma Glucose: 126 mg/dL

## 2014-12-13 ENCOUNTER — Encounter (HOSPITAL_COMMUNITY): Payer: Medicare Other | Attending: Hematology and Oncology

## 2014-12-13 DIAGNOSIS — D7589 Other specified diseases of blood and blood-forming organs: Secondary | ICD-10-CM

## 2014-12-13 DIAGNOSIS — D509 Iron deficiency anemia, unspecified: Secondary | ICD-10-CM | POA: Diagnosis not present

## 2014-12-13 DIAGNOSIS — D696 Thrombocytopenia, unspecified: Secondary | ICD-10-CM | POA: Insufficient documentation

## 2014-12-13 DIAGNOSIS — K7460 Unspecified cirrhosis of liver: Secondary | ICD-10-CM | POA: Insufficient documentation

## 2014-12-13 LAB — CBC WITH DIFFERENTIAL/PLATELET
BASOS PCT: 1 % (ref 0–1)
Basophils Absolute: 0 10*3/uL (ref 0.0–0.1)
Eosinophils Absolute: 0.1 10*3/uL (ref 0.0–0.7)
Eosinophils Relative: 4 % (ref 0–5)
HEMATOCRIT: 34 % — AB (ref 39.0–52.0)
Hemoglobin: 10.4 g/dL — ABNORMAL LOW (ref 13.0–17.0)
LYMPHS PCT: 21 % (ref 12–46)
Lymphs Abs: 0.7 10*3/uL (ref 0.7–4.0)
MCH: 28.9 pg (ref 26.0–34.0)
MCHC: 30.6 g/dL (ref 30.0–36.0)
MCV: 94.4 fL (ref 78.0–100.0)
MONO ABS: 0.3 10*3/uL (ref 0.1–1.0)
MONOS PCT: 9 % (ref 3–12)
NEUTROS ABS: 2.3 10*3/uL (ref 1.7–7.7)
Neutrophils Relative %: 66 % (ref 43–77)
Platelets: 103 10*3/uL — ABNORMAL LOW (ref 150–400)
RBC: 3.6 MIL/uL — AB (ref 4.22–5.81)
RDW: 16.7 % — AB (ref 11.5–15.5)
WBC: 3.5 10*3/uL — ABNORMAL LOW (ref 4.0–10.5)

## 2014-12-13 LAB — FERRITIN: FERRITIN: 13 ng/mL — AB (ref 24–336)

## 2014-12-13 LAB — IRON AND TIBC
IRON: 32 ug/dL — AB (ref 45–182)
Saturation Ratios: 7 % — ABNORMAL LOW (ref 17.9–39.5)
TIBC: 475 ug/dL — AB (ref 250–450)
UIBC: 443 ug/dL

## 2014-12-13 NOTE — Progress Notes (Signed)
Lab draw

## 2014-12-16 ENCOUNTER — Other Ambulatory Visit (HOSPITAL_COMMUNITY): Payer: Self-pay | Admitting: Emergency Medicine

## 2014-12-16 ENCOUNTER — Encounter (HOSPITAL_BASED_OUTPATIENT_CLINIC_OR_DEPARTMENT_OTHER): Payer: Medicare Other

## 2014-12-16 ENCOUNTER — Ambulatory Visit (HOSPITAL_COMMUNITY): Payer: Medicare Other | Admitting: Oncology

## 2014-12-16 ENCOUNTER — Encounter (HOSPITAL_BASED_OUTPATIENT_CLINIC_OR_DEPARTMENT_OTHER): Payer: Medicare Other | Admitting: Oncology

## 2014-12-16 ENCOUNTER — Other Ambulatory Visit (HOSPITAL_COMMUNITY): Payer: Self-pay | Admitting: Oncology

## 2014-12-16 ENCOUNTER — Encounter (HOSPITAL_COMMUNITY): Payer: Self-pay | Admitting: Oncology

## 2014-12-16 VITALS — BP 101/56 | HR 72 | Temp 98.1°F | Resp 18

## 2014-12-16 VITALS — BP 106/67 | HR 79 | Temp 98.4°F | Resp 18 | Wt 152.4 lb

## 2014-12-16 DIAGNOSIS — D509 Iron deficiency anemia, unspecified: Secondary | ICD-10-CM

## 2014-12-16 DIAGNOSIS — E538 Deficiency of other specified B group vitamins: Secondary | ICD-10-CM

## 2014-12-16 DIAGNOSIS — D696 Thrombocytopenia, unspecified: Secondary | ICD-10-CM

## 2014-12-16 HISTORY — DX: Deficiency of other specified B group vitamins: E53.8

## 2014-12-16 MED ORDER — SODIUM CHLORIDE 0.9 % IV SOLN
125.0000 mg | Freq: Once | INTRAVENOUS | Status: DC
  Administered 2014-12-16: 125 mg via INTRAVENOUS
  Filled 2014-12-16: qty 10

## 2014-12-16 MED ORDER — SODIUM CHLORIDE 0.9 % IV SOLN
INTRAVENOUS | Status: DC
  Administered 2014-12-16: 14:00:00 via INTRAVENOUS

## 2014-12-16 NOTE — Assessment & Plan Note (Signed)
Severe folate deficiency noted on 09/18/2014 with a Folate level of 1.0 after I noticed an upward trending macrocytosis.  He was immediately started on Folate 1 mg daily.  Will check folate level with next lab draw in 6 weeks.
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
## 2014-12-16 NOTE — Assessment & Plan Note (Addendum)
Iron deficiency requiring IV Feraheme to maintain adequate iron stores to keep up his hemoglobin.  Negative colonoscopy on 11/11/2014 and grade 1 esophageal varices noted on EGD on 01/16/2014.  Labs in 6 and 12 weeks: CBC diff, iron/TIBC, ferritin  Ferric gluconate 125 mg x 3 signed and held.  Return in 12 weeks for follow-up.

## 2014-12-16 NOTE — Assessment & Plan Note (Signed)
Thrombocytopenia, secondary to severe cirrhosis of liver with splenomegaly.  Labs as described above.

## 2014-12-16 NOTE — Patient Instructions (Signed)
[place] at [facility] Discharge Instructions  RECOMMENDATIONS MADE BY THE CONSULTANT AND ANY TEST RESULTS WILL BE SENT TO YOUR REFERRING PHYSICIAN.  Exam completed by Kirby Crigler today Ferric gluconate today and as scheduled. Continue folic acid daily Lab work in 6 and 12 weeks Return in 12 weeks to see the doctor.   Thank you for choosing [place] at [facility] to provide your oncology and hematology care.  To afford each patient quality time with our provider, please arrive at least 15 minutes before your scheduled appointment time.    You need to re-schedule your appointment should you arrive 10 or more minutes late.  We strive to give you quality time with our providers, and arriving late affects you and other patients whose appointments are after yours.  Also, if you no show three or more times for appointments you may be dismissed from the clinic at the providers discretion.     Again, thank you for choosing [facility].  Our hope is that these requests will decrease the amount of time that you wait before being seen by our physicians.       _____________________________________________________________  Should you have questions after your visit to [facility], please contact our office at [phone] between the hours of 8:30 a.m. and 4:30 p.m.  Voicemails left after 4:30 p.m. will not be returned until the following business day.  For prescription refill requests, have your pharmacy contact our office.

## 2014-12-18 ENCOUNTER — Ambulatory Visit: Payer: Medicare Other | Admitting: Gastroenterology

## 2014-12-23 ENCOUNTER — Encounter (HOSPITAL_BASED_OUTPATIENT_CLINIC_OR_DEPARTMENT_OTHER): Payer: Medicare Other

## 2014-12-23 ENCOUNTER — Encounter (HOSPITAL_COMMUNITY): Payer: Self-pay

## 2014-12-23 VITALS — BP 108/63 | HR 79 | Temp 98.2°F | Resp 18

## 2014-12-23 DIAGNOSIS — D509 Iron deficiency anemia, unspecified: Secondary | ICD-10-CM

## 2014-12-23 MED ORDER — SODIUM CHLORIDE 0.9 % IV SOLN
Freq: Once | INTRAVENOUS | Status: AC
  Administered 2014-12-23: 14:00:00 via INTRAVENOUS
  Filled 2014-12-23: qty 10

## 2014-12-23 MED ORDER — SODIUM CHLORIDE 0.9 % IV SOLN: 125.0000 mg | Freq: Once | INTRAVENOUS | Status: DC

## 2014-12-23 MED ORDER — SODIUM CHLORIDE 0.9 % IV SOLN
INTRAVENOUS | Status: DC
  Administered 2014-12-23: 14:00:00 via INTRAVENOUS

## 2014-12-23 NOTE — Progress Notes (Signed)
Brad Wall Brad Wall iron infusion well  Discharged ambulatory

## 2014-12-23 NOTE — Patient Instructions (Signed)
[facility] at [facility] Discharge Instructions  RECOMMENDATIONS MADE BY THE CONSULTANT AND ANY TEST RESULTS WILL BE SENT TO YOUR REFERRING PHYSICIAN.  Iron infusion today Follow up as scheduled Please call the clinic if you have any questions or concerns  Thank you for choosing [facility] at [facility] to provide your oncology and hematology care.  To afford each patient quality time with our provider, please arrive at least 15 minutes before your scheduled appointment time.    You need to re-schedule your appointment should you arrive 10 or more minutes late.  We strive to give you quality time with our providers, and arriving late affects you and other patients whose appointments are after yours.  Also, if you no show three or more times for appointments you may be dismissed from the clinic at the providers discretion.     Again, thank you for choosing [facility].  Our hope is that these requests will decrease the amount of time that you wait before being seen by our physicians.       _____________________________________________________________  Should you have questions after your visit to  Cancer Center, please contact our office at [phone] between the hours of 8:30 a.m. and 4:30 p.m.  Voicemails left after 4:30 p.m. will not be returned until the following business day.  For prescription refill requests, have your pharmacy contact our office.    

## 2014-12-31 ENCOUNTER — Encounter (HOSPITAL_COMMUNITY): Payer: Medicare Other | Attending: Hematology and Oncology

## 2014-12-31 ENCOUNTER — Encounter (HOSPITAL_COMMUNITY): Payer: Self-pay

## 2014-12-31 VITALS — BP 97/62 | HR 80 | Temp 98.5°F | Resp 18

## 2014-12-31 DIAGNOSIS — D696 Thrombocytopenia, unspecified: Secondary | ICD-10-CM | POA: Insufficient documentation

## 2014-12-31 DIAGNOSIS — D509 Iron deficiency anemia, unspecified: Secondary | ICD-10-CM | POA: Diagnosis not present

## 2014-12-31 DIAGNOSIS — K7460 Unspecified cirrhosis of liver: Secondary | ICD-10-CM | POA: Insufficient documentation

## 2014-12-31 MED ORDER — SODIUM CHLORIDE 0.9 % IV SOLN
Freq: Once | INTRAVENOUS | Status: AC
  Administered 2014-12-31: 15:00:00 via INTRAVENOUS

## 2014-12-31 MED ORDER — SODIUM CHLORIDE 0.9 % IJ SOLN
10.0000 mL | Freq: Once | INTRAMUSCULAR | Status: AC
  Administered 2014-12-31: 10 mL via INTRAVENOUS

## 2014-12-31 MED ORDER — NA FERRIC GLUC CPLX IN SUCROSE 12.5 MG/ML IV SOLN
125.0000 mg | Freq: Once | INTRAVENOUS | Status: AC
  Administered 2014-12-31: 125 mg via INTRAVENOUS
  Filled 2014-12-31: qty 10

## 2014-12-31 NOTE — Patient Instructions (Signed)
..  [place] at [facility] Discharge Instructions    Iron infusion today Call us with any problems  Return as scheduled.  Thank you for choosing [place] at [facility] to provide your oncology and hematology care.  To afford each patient quality time with our provider, please arrive at least 15 minutes before your scheduled appointment time.    You need to re-schedule your appointment should you arrive 10 or more minutes late.  We strive to give you quality time with our providers, and arriving late affects you and other patients whose appointments are after yours.  Also, if you no show three or more times for appointments you may be dismissed from the clinic at the providers discretion.     Again, thank you for choosing [facility].  Our hope is that these requests will decrease the amount of time that you wait before being seen by our physicians.       _____________________________________________________________  Should you have questions after your visit to [facility], please contact our office at [phone] between the hours of 8:30 a.m. and 4:30 p.m.  Voicemails left after 4:30 p.m. will not be returned until the following business day.  For prescription refill requests, have your pharmacy contact our office.

## 2014-12-31 NOTE — Progress Notes (Signed)
Tolerated well

## 2015-01-09 ENCOUNTER — Encounter (HOSPITAL_BASED_OUTPATIENT_CLINIC_OR_DEPARTMENT_OTHER): Payer: Medicare Other

## 2015-01-09 ENCOUNTER — Telehealth (HOSPITAL_COMMUNITY): Payer: Self-pay

## 2015-01-09 DIAGNOSIS — D509 Iron deficiency anemia, unspecified: Secondary | ICD-10-CM | POA: Diagnosis not present

## 2015-01-09 DIAGNOSIS — K7460 Unspecified cirrhosis of liver: Secondary | ICD-10-CM | POA: Diagnosis not present

## 2015-01-09 DIAGNOSIS — D696 Thrombocytopenia, unspecified: Secondary | ICD-10-CM | POA: Diagnosis not present

## 2015-01-09 LAB — CBC WITH DIFFERENTIAL/PLATELET
BASOS ABS: 0 10*3/uL (ref 0.0–0.1)
Basophils Relative: 1 % (ref 0–1)
EOS ABS: 0.1 10*3/uL (ref 0.0–0.7)
EOS PCT: 3 % (ref 0–5)
HCT: 34.9 % — ABNORMAL LOW (ref 39.0–52.0)
Hemoglobin: 11.1 g/dL — ABNORMAL LOW (ref 13.0–17.0)
LYMPHS ABS: 0.5 10*3/uL — AB (ref 0.7–4.0)
Lymphocytes Relative: 12 % (ref 12–46)
MCH: 30.3 pg (ref 26.0–34.0)
MCHC: 31.8 g/dL (ref 30.0–36.0)
MCV: 95.4 fL (ref 78.0–100.0)
MONO ABS: 0.3 10*3/uL (ref 0.1–1.0)
Monocytes Relative: 7 % (ref 3–12)
NEUTROS PCT: 77 % (ref 43–77)
Neutro Abs: 3.2 10*3/uL (ref 1.7–7.7)
PLATELETS: 96 10*3/uL — AB (ref 150–400)
RBC: 3.66 MIL/uL — AB (ref 4.22–5.81)
RDW: 17.2 % — AB (ref 11.5–15.5)
WBC: 4.2 10*3/uL (ref 4.0–10.5)

## 2015-01-09 LAB — IRON AND TIBC
Iron: 55 ug/dL (ref 45–182)
Saturation Ratios: 11 % — ABNORMAL LOW (ref 17.9–39.5)
TIBC: 489 ug/dL — ABNORMAL HIGH (ref 250–450)
UIBC: 434 ug/dL

## 2015-01-09 LAB — COMPREHENSIVE METABOLIC PANEL
ALBUMIN: 4.8 g/dL (ref 3.5–5.0)
ALT: 31 U/L (ref 17–63)
ANION GAP: 10 (ref 5–15)
AST: 47 U/L — ABNORMAL HIGH (ref 15–41)
Alkaline Phosphatase: 160 U/L — ABNORMAL HIGH (ref 38–126)
BUN: 12 mg/dL (ref 6–20)
CALCIUM: 9.6 mg/dL (ref 8.9–10.3)
CHLORIDE: 106 mmol/L (ref 101–111)
CO2: 20 mmol/L — AB (ref 22–32)
Creatinine, Ser: 1.01 mg/dL (ref 0.61–1.24)
GFR calc Af Amer: >60 mL/min (ref >60)
Glucose, Bld: 140 mg/dL — ABNORMAL HIGH (ref 65–99)
Potassium: 4.5 mmol/L (ref 3.5–5.1)
SODIUM: 136 mmol/L (ref 135–145)
TOTAL PROTEIN: 8 g/dL (ref 6.5–8.1)
Total Bilirubin: 0.7 mg/dL (ref 0.3–1.2)

## 2015-01-09 LAB — FERRITIN: FERRITIN: 38 ng/mL (ref 24–336)

## 2015-01-09 NOTE — Telephone Encounter (Signed)
Call from wife states that Brad Wall has not been doing well.  Sleeping a lot and has not energy.  Energy level did not improve with iron infusion and is actually weaker.  Discussed with Robynn Pane, PA-C and will come in for labs this afternoon.

## 2015-01-09 NOTE — Progress Notes (Signed)
Brad Wall's reason for visit today are for labs as scheduled per MD orders.  Venipuncture performed with a 23 gauge butterfly needle to R Antecubital.  Brad Wall tolerated venipuncture well and without incident; questions were answered and patient was discharged.

## 2015-01-10 ENCOUNTER — Other Ambulatory Visit (HOSPITAL_COMMUNITY): Payer: Self-pay | Admitting: Oncology

## 2015-01-10 ENCOUNTER — Telehealth (HOSPITAL_COMMUNITY): Payer: Self-pay

## 2015-01-10 DIAGNOSIS — D509 Iron deficiency anemia, unspecified: Secondary | ICD-10-CM

## 2015-01-10 NOTE — Telephone Encounter (Signed)
Notified that we will give 2 more iron infusions.  Scheduled for 7/22 & 7/29.  Verbalized understanding of instructions.

## 2015-01-11 ENCOUNTER — Other Ambulatory Visit: Payer: Self-pay | Admitting: Nurse Practitioner

## 2015-01-11 ENCOUNTER — Other Ambulatory Visit: Payer: Self-pay | Admitting: Gastroenterology

## 2015-01-17 ENCOUNTER — Encounter (HOSPITAL_BASED_OUTPATIENT_CLINIC_OR_DEPARTMENT_OTHER): Payer: Medicare Other

## 2015-01-17 VITALS — BP 100/58 | HR 80 | Temp 98.3°F | Resp 16

## 2015-01-17 DIAGNOSIS — D509 Iron deficiency anemia, unspecified: Secondary | ICD-10-CM

## 2015-01-17 MED ORDER — SODIUM CHLORIDE 0.9 % IJ SOLN
10.0000 mL | Freq: Once | INTRAMUSCULAR | Status: AC
  Administered 2015-01-17: 10 mL via INTRAVENOUS

## 2015-01-17 MED ORDER — SODIUM CHLORIDE 0.9 % IV SOLN
125.0000 mg | Freq: Once | INTRAVENOUS | Status: AC
  Administered 2015-01-17: 125 mg via INTRAVENOUS
  Filled 2015-01-17: qty 10

## 2015-01-17 MED ORDER — SODIUM CHLORIDE 0.9 % IV SOLN
INTRAVENOUS | Status: DC
  Administered 2015-01-17: 10:00:00 via INTRAVENOUS

## 2015-01-17 NOTE — Patient Instructions (Signed)
[facility] at [facility] Discharge Instructions  RECOMMENDATIONS MADE BY THE CONSULTANT AND ANY TEST RESULTS WILL BE SENT TO YOUR REFERRING PHYSICIAN.  Ferric gluconate 125 mg iron infusion given today as ordered. Return as scheduled.  Thank you for choosing  [facility] at [facility] to provide your oncology and hematology care.  To afford each patient quality time with our provider, please arrive at least 15 minutes before your scheduled appointment time.    You need to re-schedule your appointment should you arrive 10 or more minutes late.  We strive to give you quality time with our providers, and arriving late affects you and other patients whose appointments are after yours.  Also, if you no show three or more times for appointments you may be dismissed from the clinic at the providers discretion.     Again, thank you for choosing [facility].  Our hope is that these requests will decrease the amount of time that you wait before being seen by our physicians.       _____________________________________________________________  Should you have questions after your visit to  Cancer Center, please contact our office at [phone] between the hours of 8:30 a.m. and 4:30 p.m.  Voicemails left after 4:30 p.m. will not be returned until the following business day.  For prescription refill requests, have your pharmacy contact our office.    

## 2015-01-17 NOTE — Progress Notes (Signed)
Tolerated iron infusion well. 

## 2015-01-24 ENCOUNTER — Encounter (HOSPITAL_BASED_OUTPATIENT_CLINIC_OR_DEPARTMENT_OTHER): Payer: Medicare Other

## 2015-01-24 VITALS — BP 101/60 | HR 85 | Temp 98.4°F | Resp 16

## 2015-01-24 DIAGNOSIS — D509 Iron deficiency anemia, unspecified: Secondary | ICD-10-CM | POA: Diagnosis not present

## 2015-01-24 MED ORDER — SODIUM CHLORIDE 0.9 % IV SOLN
125.0000 mg | Freq: Once | INTRAVENOUS | Status: AC
  Administered 2015-01-24: 125 mg via INTRAVENOUS
  Filled 2015-01-24: qty 10

## 2015-01-24 MED ORDER — SODIUM CHLORIDE 0.9 % IV SOLN
INTRAVENOUS | Status: DC
Start: 2015-01-24 — End: 2015-01-24
  Administered 2015-01-24: 13:00:00 via INTRAVENOUS

## 2015-01-24 NOTE — Progress Notes (Signed)
Patient tolerated infusion well.  VSS 30 minutes post infusion.   

## 2015-01-27 ENCOUNTER — Other Ambulatory Visit (HOSPITAL_COMMUNITY): Payer: Medicare Other

## 2015-02-11 ENCOUNTER — Other Ambulatory Visit (HOSPITAL_COMMUNITY): Payer: Self-pay | Admitting: Oncology

## 2015-02-20 ENCOUNTER — Other Ambulatory Visit (HOSPITAL_COMMUNITY): Payer: Self-pay | Admitting: Oncology

## 2015-03-10 ENCOUNTER — Encounter (HOSPITAL_BASED_OUTPATIENT_CLINIC_OR_DEPARTMENT_OTHER): Payer: Medicare Other

## 2015-03-10 ENCOUNTER — Encounter (HOSPITAL_COMMUNITY): Payer: Medicare Other | Attending: Hematology and Oncology | Admitting: Hematology & Oncology

## 2015-03-10 ENCOUNTER — Encounter (HOSPITAL_COMMUNITY): Payer: Self-pay | Admitting: Hematology & Oncology

## 2015-03-10 VITALS — BP 105/70 | HR 90 | Temp 99.0°F | Resp 18 | Wt 149.0 lb

## 2015-03-10 DIAGNOSIS — D509 Iron deficiency anemia, unspecified: Secondary | ICD-10-CM

## 2015-03-10 DIAGNOSIS — R5383 Other fatigue: Secondary | ICD-10-CM

## 2015-03-10 DIAGNOSIS — D696 Thrombocytopenia, unspecified: Secondary | ICD-10-CM | POA: Diagnosis not present

## 2015-03-10 DIAGNOSIS — K7460 Unspecified cirrhosis of liver: Secondary | ICD-10-CM | POA: Insufficient documentation

## 2015-03-10 LAB — CBC WITH DIFFERENTIAL/PLATELET
BASOS PCT: 1 % (ref 0–1)
Basophils Absolute: 0 10*3/uL (ref 0.0–0.1)
EOS ABS: 0.1 10*3/uL (ref 0.0–0.7)
Eosinophils Relative: 3 % (ref 0–5)
HEMATOCRIT: 35.3 % — AB (ref 39.0–52.0)
Hemoglobin: 11.1 g/dL — ABNORMAL LOW (ref 13.0–17.0)
Lymphocytes Relative: 16 % (ref 12–46)
Lymphs Abs: 0.6 10*3/uL — ABNORMAL LOW (ref 0.7–4.0)
MCH: 29.2 pg (ref 26.0–34.0)
MCHC: 31.4 g/dL (ref 30.0–36.0)
MCV: 92.9 fL (ref 78.0–100.0)
MONOS PCT: 8 % (ref 3–12)
Monocytes Absolute: 0.3 10*3/uL (ref 0.1–1.0)
NEUTROS ABS: 3 10*3/uL (ref 1.7–7.7)
Neutrophils Relative %: 72 % (ref 43–77)
Platelets: 123 10*3/uL — ABNORMAL LOW (ref 150–400)
RBC: 3.8 MIL/uL — ABNORMAL LOW (ref 4.22–5.81)
RDW: 16.6 % — AB (ref 11.5–15.5)
WBC: 4.1 10*3/uL (ref 4.0–10.5)

## 2015-03-10 LAB — IRON AND TIBC
IRON: 50 ug/dL (ref 45–182)
Saturation Ratios: 9 % — ABNORMAL LOW (ref 17.9–39.5)
TIBC: 536 ug/dL — ABNORMAL HIGH (ref 250–450)
UIBC: 486 ug/dL

## 2015-03-10 LAB — TSH: TSH: 1.302 u[IU]/mL (ref 0.350–4.500)

## 2015-03-10 LAB — AMMONIA: Ammonia: 51 umol/L — ABNORMAL HIGH (ref 9–35)

## 2015-03-10 LAB — FERRITIN: Ferritin: 10 ng/mL — ABNORMAL LOW (ref 24–336)

## 2015-03-10 NOTE — Progress Notes (Signed)
LABS DRAWN

## 2015-03-10 NOTE — Patient Instructions (Signed)
..  [place] at [facility] Discharge Instructions  RECOMMENDATIONS MADE BY THE CONSULTANT AND ANY TEST RESULTS WILL BE SENT TO YOUR REFERRING PHYSICIAN.  Additional labs today  Return in 3 months  Thank you for choosing [place] at [facility] to provide your oncology and hematology care.  To afford each patient quality time with our provider, please arrive at least 15 minutes before your scheduled appointment time.    You need to re-schedule your appointment should you arrive 10 or more minutes late.  We strive to give you quality time with our providers, and arriving late affects you and other patients whose appointments are after yours.  Also, if you no show three or more times for appointments you may be dismissed from the clinic at the providers discretion.     Again, thank you for choosing [facility].  Our hope is that these requests will decrease the amount of time that you wait before being seen by our physicians.       _____________________________________________________________  Should you have questions after your visit to [facility], please contact our office at [phone] between the hours of 8:30 a.m. and 4:30 p.m.  Voicemails left after 4:30 p.m. will not be returned until the following business day.  For prescription refill requests, have your pharmacy contact our office.
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
## 2015-03-10 NOTE — Progress Notes (Signed)
..  Brad Wall's reason for visit today are for labs as scheduled per MD orders.  Venipuncture performed with a 23 gauge butterfly needle to R Antecubital.  Brad Wall tolerated venipuncture well and without incident; questions were answered and patient was discharged.

## 2015-03-11 ENCOUNTER — Other Ambulatory Visit (HOSPITAL_COMMUNITY): Payer: Self-pay | Admitting: Oncology

## 2015-03-11 LAB — VITAMIN D 25 HYDROXY (VIT D DEFICIENCY, FRACTURES): Vit D, 25-Hydroxy: 29.2 ng/mL — ABNORMAL LOW (ref 30.0–100.0)

## 2015-03-12 ENCOUNTER — Other Ambulatory Visit (HOSPITAL_COMMUNITY): Payer: Self-pay | Admitting: *Deleted

## 2015-03-12 MED ORDER — ERGOCALCIFEROL 1.25 MG (50000 UT) PO CAPS: 50000.0000 [IU] | ORAL_CAPSULE | ORAL | Status: DC

## 2015-03-14 ENCOUNTER — Encounter (HOSPITAL_COMMUNITY): Payer: Self-pay

## 2015-03-14 ENCOUNTER — Encounter (HOSPITAL_BASED_OUTPATIENT_CLINIC_OR_DEPARTMENT_OTHER): Payer: Medicare Other

## 2015-03-14 DIAGNOSIS — D509 Iron deficiency anemia, unspecified: Secondary | ICD-10-CM | POA: Diagnosis not present

## 2015-03-14 MED ORDER — SODIUM CHLORIDE 0.9 % IV SOLN
INTRAVENOUS | Status: DC
  Administered 2015-03-14: 10:00:00 via INTRAVENOUS

## 2015-03-14 MED ORDER — NA FERRIC GLUC CPLX IN SUCROSE 12.5 MG/ML IV SOLN
125.0000 mg | Freq: Once | INTRAVENOUS | Status: AC
  Administered 2015-03-14: 125 mg via INTRAVENOUS
  Filled 2015-03-14: qty 10

## 2015-03-14 NOTE — Patient Instructions (Signed)
[place] at [facility] Discharge Instructions  RECOMMENDATIONS MADE BY THE CONSULTANT AND ANY TEST RESULTS WILL BE SENT TO YOUR REFERRING PHYSICIAN.  Iron infusion today. Return as scheduled next week for iron infusion.   Thank you for choosing [place] at [facility] to provide your oncology and hematology care.  To afford each patient quality time with our Kely Dohn, please arrive at least 15 minutes before your scheduled appointment time.    You need to re-schedule your appointment should you arrive 10 or more minutes late.  We strive to give you quality time with our providers, and arriving late affects you and other patients whose appointments are after yours.  Also, if you no show three or more times for appointments you may be dismissed from the clinic at the providers discretion.     Again, thank you for choosing [facility].  Our hope is that these requests will decrease the amount of time that you wait before being seen by our physicians.       _____________________________________________________________  Should you have questions after your visit to [facility], please contact our office at [phone] between the hours of 8:30 a.m. and 4:30 p.m.  Voicemails left after 4:30 p.m. will not be returned until the following business day.  For prescription refill requests, have your pharmacy contact our office.

## 2015-03-14 NOTE — Progress Notes (Signed)
1140:  Tolerated infusion w/o adverse reaction.  A&Ox4, in no distress.  VSS.  Discharged ambulatory in c/o wife.

## 2015-03-18 ENCOUNTER — Encounter (HOSPITAL_COMMUNITY): Payer: Self-pay

## 2015-03-18 ENCOUNTER — Encounter (HOSPITAL_BASED_OUTPATIENT_CLINIC_OR_DEPARTMENT_OTHER): Payer: Medicare Other

## 2015-03-18 DIAGNOSIS — D509 Iron deficiency anemia, unspecified: Secondary | ICD-10-CM

## 2015-03-18 MED ORDER — SODIUM CHLORIDE 0.9 % IV SOLN
INTRAVENOUS | Status: DC
  Administered 2015-03-18: 14:00:00 via INTRAVENOUS

## 2015-03-18 MED ORDER — SODIUM CHLORIDE 0.9 % IV SOLN
125.0000 mg | Freq: Once | INTRAVENOUS | Status: AC
  Administered 2015-03-18: 125 mg via INTRAVENOUS
  Filled 2015-03-18: qty 10

## 2015-03-18 NOTE — Progress Notes (Signed)
Brad Wall Tolerated iron infusion well today discharged ambulatory

## 2015-03-18 NOTE — Patient Instructions (Signed)
[facility] at [facility] Discharge Instructions  RECOMMENDATIONS MADE BY THE CONSULTANT AND ANY TEST RESULTS WILL BE SENT TO YOUR REFERRING PHYSICIAN.  IV iron infusion today Follow up as scheduled Please call the clinic if you have any questions or concerns  Thank you for choosing [facility] at [facility] to provide your oncology and hematology care.  To afford each patient quality time with our provider, please arrive at least 15 minutes before your scheduled appointment time.    You need to re-schedule your appointment should you arrive 10 or more minutes late.  We strive to give you quality time with our providers, and arriving late affects you and other patients whose appointments are after yours.  Also, if you no show three or more times for appointments you may be dismissed from the clinic at the providers discretion.     Again, thank you for choosing [facility].  Our hope is that these requests will decrease the amount of time that you wait before being seen by our physicians.       _____________________________________________________________  Should you have questions after your visit to [facility], please contact our office at [phone] between the hours of 8:30 a.m. and 4:30 p.m.  Voicemails left after 4:30 p.m. will not be returned until the following business day.  For prescription refill requests, have your pharmacy contact our office.     

## 2015-04-02 ENCOUNTER — Emergency Department (HOSPITAL_COMMUNITY): Payer: Medicare Other

## 2015-04-02 ENCOUNTER — Encounter (HOSPITAL_COMMUNITY): Payer: Self-pay | Admitting: Emergency Medicine

## 2015-04-02 ENCOUNTER — Emergency Department (HOSPITAL_COMMUNITY)
Admission: EM | Admit: 2015-04-02 | Discharge: 2015-04-02 | Disposition: A | Discharge status: Home / Self Care | Payer: Medicare Other | Attending: Emergency Medicine | Admitting: Emergency Medicine

## 2015-04-02 DIAGNOSIS — S20212A Contusion of left front wall of thorax, initial encounter: Secondary | ICD-10-CM | POA: Diagnosis not present

## 2015-04-02 DIAGNOSIS — K219 Gastro-esophageal reflux disease without esophagitis: Secondary | ICD-10-CM | POA: Insufficient documentation

## 2015-04-02 DIAGNOSIS — E119 Type 2 diabetes mellitus without complications: Secondary | ICD-10-CM | POA: Insufficient documentation

## 2015-04-02 DIAGNOSIS — S299XXA Unspecified injury of thorax, initial encounter: Secondary | ICD-10-CM | POA: Diagnosis present

## 2015-04-02 DIAGNOSIS — D509 Iron deficiency anemia, unspecified: Secondary | ICD-10-CM | POA: Insufficient documentation

## 2015-04-02 DIAGNOSIS — F329 Major depressive disorder, single episode, unspecified: Secondary | ICD-10-CM | POA: Diagnosis not present

## 2015-04-02 DIAGNOSIS — Z8673 Personal history of transient ischemic attack (TIA), and cerebral infarction without residual deficits: Secondary | ICD-10-CM | POA: Diagnosis not present

## 2015-04-02 DIAGNOSIS — R51 Headache: Secondary | ICD-10-CM

## 2015-04-02 DIAGNOSIS — E785 Hyperlipidemia, unspecified: Secondary | ICD-10-CM | POA: Diagnosis not present

## 2015-04-02 DIAGNOSIS — Z794 Long term (current) use of insulin: Secondary | ICD-10-CM | POA: Diagnosis not present

## 2015-04-02 DIAGNOSIS — Z7982 Long term (current) use of aspirin: Secondary | ICD-10-CM | POA: Insufficient documentation

## 2015-04-02 DIAGNOSIS — I10 Essential (primary) hypertension: Secondary | ICD-10-CM | POA: Insufficient documentation

## 2015-04-02 DIAGNOSIS — Z79899 Other long term (current) drug therapy: Secondary | ICD-10-CM | POA: Diagnosis not present

## 2015-04-02 DIAGNOSIS — Y998 Other external cause status: Secondary | ICD-10-CM | POA: Diagnosis not present

## 2015-04-02 DIAGNOSIS — Y9389 Activity, other specified: Secondary | ICD-10-CM | POA: Insufficient documentation

## 2015-04-02 DIAGNOSIS — Z8619 Personal history of other infectious and parasitic diseases: Secondary | ICD-10-CM | POA: Insufficient documentation

## 2015-04-02 DIAGNOSIS — F419 Anxiety disorder, unspecified: Secondary | ICD-10-CM | POA: Insufficient documentation

## 2015-04-02 DIAGNOSIS — Z87891 Personal history of nicotine dependence: Secondary | ICD-10-CM | POA: Insufficient documentation

## 2015-04-02 DIAGNOSIS — R519 Headache, unspecified: Secondary | ICD-10-CM

## 2015-04-02 DIAGNOSIS — W01198A Fall on same level from slipping, tripping and stumbling with subsequent striking against other object, initial encounter: Secondary | ICD-10-CM | POA: Diagnosis not present

## 2015-04-02 DIAGNOSIS — G8929 Other chronic pain: Secondary | ICD-10-CM | POA: Diagnosis not present

## 2015-04-02 DIAGNOSIS — Z880 Allergy status to penicillin: Secondary | ICD-10-CM | POA: Insufficient documentation

## 2015-04-02 DIAGNOSIS — E538 Deficiency of other specified B group vitamins: Secondary | ICD-10-CM | POA: Insufficient documentation

## 2015-04-02 DIAGNOSIS — Y9289 Other specified places as the place of occurrence of the external cause: Secondary | ICD-10-CM | POA: Insufficient documentation

## 2015-04-02 DIAGNOSIS — W19XXXA Unspecified fall, initial encounter: Secondary | ICD-10-CM

## 2015-04-02 DIAGNOSIS — G43909 Migraine, unspecified, not intractable, without status migrainosus: Secondary | ICD-10-CM | POA: Insufficient documentation

## 2015-04-02 MED ORDER — KETOROLAC TROMETHAMINE 30 MG/ML IJ SOLN
30.0000 mg | Freq: Once | INTRAMUSCULAR | Status: AC
  Administered 2015-04-02: 30 mg via INTRAVENOUS
  Filled 2015-04-02: qty 1

## 2015-04-02 MED ORDER — METOCLOPRAMIDE HCL 5 MG/ML IJ SOLN
10.0000 mg | Freq: Once | INTRAMUSCULAR | Status: AC
  Administered 2015-04-02: 10 mg via INTRAVENOUS
  Filled 2015-04-02: qty 2

## 2015-04-02 MED ORDER — SODIUM CHLORIDE 0.9 % IV BOLUS (SEPSIS)
500.0000 mL | Freq: Once | INTRAVENOUS | Status: AC
  Administered 2015-04-02: 500 mL via INTRAVENOUS

## 2015-04-02 MED ORDER — DIPHENHYDRAMINE HCL 50 MG/ML IJ SOLN
25.0000 mg | Freq: Once | INTRAMUSCULAR | Status: AC
  Administered 2015-04-02: 25 mg via INTRAVENOUS
  Filled 2015-04-02: qty 1

## 2015-04-02 NOTE — ED Notes (Signed)
Arrives EMS, fall hx of stroke with left sided weakness , left hip, rib and shoulder pain, complaining of headache, BS 177

## 2015-04-02 NOTE — ED Notes (Signed)
Since fall pt having migraine headache

## 2015-04-02 NOTE — Discharge Instructions (Signed)
X-ray shows no rib fracture. Follow-up your primary care doctor.
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
## 2015-04-30 ENCOUNTER — Other Ambulatory Visit (HOSPITAL_COMMUNITY): Payer: Self-pay | Admitting: Family Medicine

## 2015-04-30 ENCOUNTER — Other Ambulatory Visit (HOSPITAL_COMMUNITY)
Admission: RE | Admit: 2015-04-30 | Discharge: 2015-04-30 | Disposition: A | Discharge status: Home / Self Care | Payer: Medicare Other | Source: Ambulatory Visit | Attending: Family Medicine | Admitting: Family Medicine

## 2015-04-30 ENCOUNTER — Ambulatory Visit (HOSPITAL_COMMUNITY)
Admission: RE | Admit: 2015-04-30 | Discharge: 2015-04-30 | Disposition: A | Discharge status: Home / Self Care | Payer: Medicare Other | Source: Ambulatory Visit | Attending: Family Medicine | Admitting: Family Medicine

## 2015-04-30 DIAGNOSIS — E1165 Type 2 diabetes mellitus with hyperglycemia: Secondary | ICD-10-CM | POA: Insufficient documentation

## 2015-04-30 DIAGNOSIS — N200 Calculus of kidney: Secondary | ICD-10-CM | POA: Insufficient documentation

## 2015-04-30 DIAGNOSIS — R1084 Generalized abdominal pain: Secondary | ICD-10-CM | POA: Insufficient documentation

## 2015-04-30 DIAGNOSIS — R188 Other ascites: Secondary | ICD-10-CM | POA: Diagnosis not present

## 2015-04-30 DIAGNOSIS — K7460 Unspecified cirrhosis of liver: Secondary | ICD-10-CM | POA: Diagnosis not present

## 2015-04-30 DIAGNOSIS — R161 Splenomegaly, not elsewhere classified: Secondary | ICD-10-CM | POA: Insufficient documentation

## 2015-04-30 LAB — CBC WITH DIFFERENTIAL/PLATELET
Basophils Absolute: 0 10*3/uL (ref 0.0–0.1)
Basophils Relative: 1 %
EOS ABS: 0.1 10*3/uL (ref 0.0–0.7)
EOS PCT: 2 %
HCT: 29.6 % — ABNORMAL LOW (ref 39.0–52.0)
Hemoglobin: 9.1 g/dL — ABNORMAL LOW (ref 13.0–17.0)
LYMPHS PCT: 16 %
Lymphs Abs: 0.6 10*3/uL — ABNORMAL LOW (ref 0.7–4.0)
MCH: 27.5 pg (ref 26.0–34.0)
MCHC: 30.7 g/dL (ref 30.0–36.0)
MCV: 89.4 fL (ref 78.0–100.0)
MONO ABS: 0.3 10*3/uL (ref 0.1–1.0)
Monocytes Relative: 8 %
Neutro Abs: 2.7 10*3/uL (ref 1.7–7.7)
Neutrophils Relative %: 73 %
PLATELETS: 102 10*3/uL — AB (ref 150–400)
RBC: 3.31 MIL/uL — AB (ref 4.22–5.81)
RDW: 15.4 % (ref 11.5–15.5)
WBC: 3.7 10*3/uL — AB (ref 4.0–10.5)

## 2015-04-30 LAB — LIPID PANEL
Cholesterol: 92 mg/dL (ref 0–200)
HDL: 25 mg/dL — AB (ref >40)
LDL CALC: 20 mg/dL (ref 0–99)
Total CHOL/HDL Ratio: 3.7 RATIO
Triglycerides: 237 mg/dL — ABNORMAL HIGH (ref <150)
VLDL: 47 mg/dL — ABNORMAL HIGH (ref 0–40)

## 2015-04-30 LAB — BASIC METABOLIC PANEL
ANION GAP: 8 (ref 5–15)
BUN: 10 mg/dL (ref 6–20)
CALCIUM: 9.5 mg/dL (ref 8.9–10.3)
CO2: 23 mmol/L (ref 22–32)
Chloride: 107 mmol/L (ref 101–111)
Creatinine, Ser: 0.9 mg/dL (ref 0.61–1.24)
GFR calc Af Amer: >60 mL/min (ref >60)
GFR calc non Af Amer: >60 mL/min (ref >60)
GLUCOSE: 111 mg/dL — AB (ref 65–99)
Potassium: 4.7 mmol/L (ref 3.5–5.1)
Sodium: 138 mmol/L (ref 135–145)

## 2015-04-30 LAB — SEDIMENTATION RATE: Sed Rate: 20 mm/hr — ABNORMAL HIGH (ref 0–16)

## 2015-04-30 LAB — HEPATIC FUNCTION PANEL
ALK PHOS: 154 U/L — AB (ref 38–126)
ALT: 32 U/L (ref 17–63)
AST: 47 U/L — AB (ref 15–41)
Albumin: 4.2 g/dL (ref 3.5–5.0)
BILIRUBIN DIRECT: 0.2 mg/dL (ref 0.1–0.5)
BILIRUBIN INDIRECT: 0.4 mg/dL (ref 0.3–0.9)
BILIRUBIN TOTAL: 0.6 mg/dL (ref 0.3–1.2)
Total Protein: 7.4 g/dL (ref 6.5–8.1)

## 2015-04-30 LAB — AMMONIA: Ammonia: 41 umol/L — ABNORMAL HIGH (ref 9–35)

## 2015-04-30 LAB — LIPASE, BLOOD: Lipase: 27 U/L (ref 11–51)

## 2015-04-30 LAB — C-REACTIVE PROTEIN: CRP: 0.9 mg/dL (ref <1.0)

## 2015-05-01 LAB — HEMOGLOBIN A1C
HEMOGLOBIN A1C: 7.1 % — AB (ref 4.8–5.6)
Mean Plasma Glucose: 157 mg/dL

## 2015-05-02 ENCOUNTER — Emergency Department (HOSPITAL_COMMUNITY)
Admission: EM | Admit: 2015-05-02 | Discharge: 2015-05-02 | Disposition: A | Discharge status: Home / Self Care | Payer: Medicare Other | Attending: Emergency Medicine | Admitting: Emergency Medicine

## 2015-05-02 ENCOUNTER — Emergency Department (HOSPITAL_COMMUNITY): Payer: Medicare Other

## 2015-05-02 ENCOUNTER — Encounter (HOSPITAL_COMMUNITY): Payer: Self-pay | Admitting: Emergency Medicine

## 2015-05-02 DIAGNOSIS — F419 Anxiety disorder, unspecified: Secondary | ICD-10-CM | POA: Diagnosis not present

## 2015-05-02 DIAGNOSIS — E119 Type 2 diabetes mellitus without complications: Secondary | ICD-10-CM | POA: Diagnosis not present

## 2015-05-02 DIAGNOSIS — F329 Major depressive disorder, single episode, unspecified: Secondary | ICD-10-CM | POA: Insufficient documentation

## 2015-05-02 DIAGNOSIS — K219 Gastro-esophageal reflux disease without esophagitis: Secondary | ICD-10-CM | POA: Insufficient documentation

## 2015-05-02 DIAGNOSIS — K7460 Unspecified cirrhosis of liver: Secondary | ICD-10-CM

## 2015-05-02 DIAGNOSIS — Z7982 Long term (current) use of aspirin: Secondary | ICD-10-CM | POA: Diagnosis not present

## 2015-05-02 DIAGNOSIS — R1013 Epigastric pain: Secondary | ICD-10-CM | POA: Insufficient documentation

## 2015-05-02 DIAGNOSIS — D696 Thrombocytopenia, unspecified: Secondary | ICD-10-CM | POA: Diagnosis not present

## 2015-05-02 DIAGNOSIS — K7581 Nonalcoholic steatohepatitis (NASH): Secondary | ICD-10-CM

## 2015-05-02 DIAGNOSIS — Z880 Allergy status to penicillin: Secondary | ICD-10-CM | POA: Insufficient documentation

## 2015-05-02 DIAGNOSIS — G43C0 Periodic headache syndromes in child or adult, not intractable: Secondary | ICD-10-CM | POA: Insufficient documentation

## 2015-05-02 DIAGNOSIS — D649 Anemia, unspecified: Secondary | ICD-10-CM

## 2015-05-02 DIAGNOSIS — Z8619 Personal history of other infectious and parasitic diseases: Secondary | ICD-10-CM | POA: Insufficient documentation

## 2015-05-02 DIAGNOSIS — G8929 Other chronic pain: Secondary | ICD-10-CM | POA: Diagnosis not present

## 2015-05-02 DIAGNOSIS — Z79899 Other long term (current) drug therapy: Secondary | ICD-10-CM | POA: Insufficient documentation

## 2015-05-02 DIAGNOSIS — H9190 Unspecified hearing loss, unspecified ear: Secondary | ICD-10-CM | POA: Insufficient documentation

## 2015-05-02 DIAGNOSIS — R748 Abnormal levels of other serum enzymes: Secondary | ICD-10-CM | POA: Diagnosis not present

## 2015-05-02 DIAGNOSIS — R51 Headache: Secondary | ICD-10-CM | POA: Diagnosis present

## 2015-05-02 DIAGNOSIS — I10 Essential (primary) hypertension: Secondary | ICD-10-CM | POA: Insufficient documentation

## 2015-05-02 DIAGNOSIS — E785 Hyperlipidemia, unspecified: Secondary | ICD-10-CM | POA: Insufficient documentation

## 2015-05-02 DIAGNOSIS — K7469 Other cirrhosis of liver: Secondary | ICD-10-CM | POA: Insufficient documentation

## 2015-05-02 DIAGNOSIS — Z87891 Personal history of nicotine dependence: Secondary | ICD-10-CM | POA: Diagnosis not present

## 2015-05-02 DIAGNOSIS — Z8673 Personal history of transient ischemic attack (TIA), and cerebral infarction without residual deficits: Secondary | ICD-10-CM | POA: Insufficient documentation

## 2015-05-02 DIAGNOSIS — R109 Unspecified abdominal pain: Secondary | ICD-10-CM

## 2015-05-02 DIAGNOSIS — Z794 Long term (current) use of insulin: Secondary | ICD-10-CM | POA: Diagnosis not present

## 2015-05-02 LAB — URINALYSIS, ROUTINE W REFLEX MICROSCOPIC
Bilirubin Urine: NEGATIVE
GLUCOSE, UA: NEGATIVE mg/dL
HGB URINE DIPSTICK: NEGATIVE
Ketones, ur: NEGATIVE mg/dL
Leukocytes, UA: NEGATIVE
Nitrite: NEGATIVE
PH: 5.5 (ref 5.0–8.0)
PROTEIN: NEGATIVE mg/dL
Specific Gravity, Urine: 1.01 (ref 1.005–1.030)
UROBILINOGEN UA: 0.2 mg/dL (ref 0.0–1.0)

## 2015-05-02 LAB — COMPREHENSIVE METABOLIC PANEL
ALT: 28 U/L (ref 17–63)
AST: 41 U/L (ref 15–41)
Albumin: 4.2 g/dL (ref 3.5–5.0)
Alkaline Phosphatase: 155 U/L — ABNORMAL HIGH (ref 38–126)
Anion gap: 8 (ref 5–15)
BUN: 9 mg/dL (ref 6–20)
CHLORIDE: 106 mmol/L (ref 101–111)
CO2: 22 mmol/L (ref 22–32)
Calcium: 9.4 mg/dL (ref 8.9–10.3)
Creatinine, Ser: 0.89 mg/dL (ref 0.61–1.24)
Glucose, Bld: 130 mg/dL — ABNORMAL HIGH (ref 65–99)
POTASSIUM: 4.4 mmol/L (ref 3.5–5.1)
SODIUM: 136 mmol/L (ref 135–145)
Total Bilirubin: 0.6 mg/dL (ref 0.3–1.2)
Total Protein: 7.5 g/dL (ref 6.5–8.1)

## 2015-05-02 LAB — CBC WITH DIFFERENTIAL/PLATELET
BASOS ABS: 0 10*3/uL (ref 0.0–0.1)
Basophils Relative: 0 %
Eosinophils Absolute: 0.1 10*3/uL (ref 0.0–0.7)
Eosinophils Relative: 2 %
HEMATOCRIT: 29.3 % — AB (ref 39.0–52.0)
HEMOGLOBIN: 9 g/dL — AB (ref 13.0–17.0)
LYMPHS PCT: 15 %
Lymphs Abs: 0.7 10*3/uL (ref 0.7–4.0)
MCH: 27.5 pg (ref 26.0–34.0)
MCHC: 30.7 g/dL (ref 30.0–36.0)
MCV: 89.6 fL (ref 78.0–100.0)
Monocytes Absolute: 0.5 10*3/uL (ref 0.1–1.0)
Monocytes Relative: 12 %
NEUTROS ABS: 3.2 10*3/uL (ref 1.7–7.7)
NEUTROS PCT: 71 %
PLATELETS: 101 10*3/uL — AB (ref 150–400)
RBC: 3.27 MIL/uL — AB (ref 4.22–5.81)
RDW: 15.4 % (ref 11.5–15.5)
WBC: 4.5 10*3/uL (ref 4.0–10.5)

## 2015-05-02 LAB — PROTIME-INR
INR: 1.22 (ref 0.00–1.49)
Prothrombin Time: 15.5 seconds — ABNORMAL HIGH (ref 11.6–15.2)

## 2015-05-02 LAB — CBG MONITORING, ED: GLUCOSE-CAPILLARY: 129 mg/dL — AB (ref 65–99)

## 2015-05-02 LAB — LIPASE, BLOOD: LIPASE: 29 U/L (ref 11–51)

## 2015-05-02 MED ORDER — GI COCKTAIL ~~LOC~~
30.0000 mL | Freq: Once | ORAL | Status: AC
  Administered 2015-05-02: 30 mL via ORAL
  Filled 2015-05-02: qty 30

## 2015-05-02 MED ORDER — METOCLOPRAMIDE HCL 5 MG/ML IJ SOLN
10.0000 mg | Freq: Once | INTRAMUSCULAR | Status: AC
  Administered 2015-05-02: 10 mg via INTRAVENOUS
  Filled 2015-05-02: qty 2

## 2015-05-02 MED ORDER — SODIUM CHLORIDE 0.9 % IV SOLN
1000.0000 mL | Freq: Once | INTRAVENOUS | Status: AC
  Administered 2015-05-02: 1000 mL via INTRAVENOUS

## 2015-05-02 MED ORDER — MORPHINE SULFATE (PF) 4 MG/ML IV SOLN
4.0000 mg | Freq: Once | INTRAVENOUS | Status: AC
  Administered 2015-05-02: 4 mg via INTRAVENOUS
  Filled 2015-05-02: qty 1

## 2015-05-02 MED ORDER — SODIUM CHLORIDE 0.9 % IV SOLN
1000.0000 mL | INTRAVENOUS | Status: DC
  Administered 2015-05-02: 1000 mL via INTRAVENOUS

## 2015-05-02 MED ORDER — PANTOPRAZOLE SODIUM 40 MG IV SOLR
40.0000 mg | Freq: Once | INTRAVENOUS | Status: AC
  Administered 2015-05-02: 40 mg via INTRAVENOUS
  Filled 2015-05-02: qty 40

## 2015-05-02 MED ORDER — MORPHINE SULFATE (PF) 4 MG/ML IV SOLN
4.0000 mg | Freq: Once | INTRAVENOUS | Status: AC
Start: 2015-05-02 — End: 2015-05-02
  Administered 2015-05-02: 4 mg via INTRAVENOUS
  Filled 2015-05-02: qty 1

## 2015-05-02 MED ORDER — IOHEXOL 300 MG/ML  SOLN
100.0000 mL | Freq: Once | INTRAMUSCULAR | Status: AC | PRN
  Administered 2015-05-02: 100 mL via INTRAVENOUS

## 2015-05-02 MED ORDER — DIPHENHYDRAMINE HCL 50 MG/ML IJ SOLN
25.0000 mg | Freq: Once | INTRAMUSCULAR | Status: AC
  Administered 2015-05-02: 25 mg via INTRAVENOUS
  Filled 2015-05-02: qty 1

## 2015-05-02 MED ORDER — IOHEXOL 300 MG/ML  SOLN
50.0000 mL | Freq: Once | INTRAMUSCULAR | Status: AC | PRN
  Administered 2015-05-02: 50 mL via ORAL

## 2015-05-02 NOTE — Discharge Instructions (Signed)
Continue taking her home medications. Return if symptoms are not being adequately controlled at home. Otherwise, see your gastroenterologist as soon as possible so that he can do whatever other testing is needed to find out what is causing your pain.   Abdominal Pain, Adult Many things can cause abdominal pain. Usually, abdominal pain is not caused by a disease and will improve without treatment. It can often be observed and treated at home. Your health care provider will do a physical exam and possibly order blood tests and X-rays to help determine the seriousness of your pain. However, in many cases, more time must pass before a clear cause of the pain can be found. Before that point, your health care provider may not know if you need more testing or further treatment. HOME CARE INSTRUCTIONS Monitor your abdominal pain for any changes. The following actions may help to alleviate any discomfort you are experiencing:  Only take over-the-counter or prescription medicines as directed by your health care provider.  Do not take laxatives unless directed to do so by your health care provider.  Try a clear liquid diet (broth, tea, or water) as directed by your health care provider. Slowly move to a bland diet as tolerated. SEEK MEDICAL CARE IF:  You have unexplained abdominal pain.  You have abdominal pain associated with nausea or diarrhea.  You have pain when you urinate or have a bowel movement.  You experience abdominal pain that wakes you in the night.  You have abdominal pain that is worsened or improved by eating food.  You have abdominal pain that is worsened with eating fatty foods.  You have a fever. SEEK IMMEDIATE MEDICAL CARE IF:  Your pain does not go away within 2 hours.  You keep throwing up (vomiting).  Your pain is felt only in portions of the abdomen, such as the right side or the left lower portion of the abdomen.  You pass bloody or black tarry stools. MAKE SURE  YOU:  Understand these instructions.  Will watch your condition.  Will get help right away if you are not doing well or get worse.   This information is not intended to replace advice given to you by your health care provider. Make sure you discuss any questions you have with your health care provider.   Document Released: 03/24/2005 Document Revised: 03/05/2015 Document Reviewed: 02/21/2013 Elsevier Interactive Patient Education [employer].

## 2015-05-02 NOTE — ED Notes (Signed)
MD at bedside. 
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
## 2015-05-02 NOTE — ED Notes (Signed)
Pt sent by MD Karie Kirks. Pt c/o of mid, upper abdominal pain x 2 weeks and nausea. Wife states that pt's blood sugar has also been elevated since Sunday. CBG this morning was 200. Denies vomiting and diarrhea. Pt has a known hernia.

## 2015-05-07 ENCOUNTER — Other Ambulatory Visit: Payer: Self-pay

## 2015-05-07 ENCOUNTER — Ambulatory Visit (INDEPENDENT_AMBULATORY_CARE_PROVIDER_SITE_OTHER): Payer: Medicare Other | Admitting: Gastroenterology

## 2015-05-07 ENCOUNTER — Encounter: Payer: Self-pay | Admitting: Gastroenterology

## 2015-05-07 VITALS — BP 100/65 | HR 96 | Temp 99.2°F | Ht 69.0 in | Wt 155.2 lb

## 2015-05-07 DIAGNOSIS — R1013 Epigastric pain: Secondary | ICD-10-CM | POA: Diagnosis not present

## 2015-05-07 MED ORDER — SUCRALFATE 1 GM/10ML PO SUSP: 1.0000 g | Freq: Three times a day (TID) | ORAL | Status: DC

## 2015-05-07 NOTE — Patient Instructions (Signed)
I have sent in a prescription called Carafate suspension to your pharmacy. Take this four times a day.   We have scheduled you for an upper endoscopy with Dr. [name].

## 2015-05-08 ENCOUNTER — Telehealth: Payer: Self-pay | Admitting: Internal Medicine

## 2015-05-08 NOTE — Telephone Encounter (Signed)
Pt was seen yesterday and wife called today and wanted AS to know that the prescription that she put patient on was $71 and she didn't pick it up because they couldn't afford it. [id number]

## 2015-05-08 NOTE — Telephone Encounter (Signed)
Can we find out if doing the carafate tablets would be cheaper.

## 2015-05-08 NOTE — Telephone Encounter (Signed)
I called [employer], I spoke with Nicki Reaper, he said the pill form would be a lot cheaper, he is going to change rx to pill form. I tried to call the pt, NA, LMOM with this information and asked them to call [place] this afternoon to see what the price would be.

## 2015-05-13 ENCOUNTER — Encounter: Payer: Self-pay | Admitting: Gastroenterology

## 2015-05-13 ENCOUNTER — Other Ambulatory Visit: Payer: Self-pay | Admitting: Gastroenterology

## 2015-05-13 NOTE — Patient Instructions (Signed)
Brad Wall  05/13/2015     @PREFPERIOPPHARMACY @   Your procedure is scheduled on 05/19/2015.  Report to [facility] at 6:45 A.M.  Call this number if you have problems the morning of surgery:  [phone]   Remember:  Do not eat food or drink liquids after midnight.  Take these medicines the morning of surgery with A SIP OF WATER Fioracet, Celexa, Flexeril, Nexium, Lopid, Ativan, Pamelor,  Compazine, Inderal, Flomax, Carafate, Topamax   Do not wear jewelry, make-up or nail polish.  Do not wear lotions, powders, or perfumes.  You may wear deodorant.  Do not shave 48 hours prior to surgery.  Men may shave face and neck.  Do not bring valuables to the hospital.  [facility] is not responsible for any belongings or valuables.  Contacts, dentures or bridgework may not be worn into surgery.  Leave your suitcase in the car.  After surgery it may be brought to your room.  For patients admitted to the hospital, discharge time will be determined by your treatment team.  Patients discharged the day of surgery will not be allowed to drive home.    Please read over the following fact sheets that you were given. Anesthesia Post-op Instructions     PATIENT INSTRUCTIONS POST-ANESTHESIA  IMMEDIATELY FOLLOWING SURGERY:  Do not drive or operate machinery for the first twenty four hours after surgery.  Do not make any important decisions for twenty four hours after surgery or while taking narcotic pain medications or sedatives.  If you develop intractable nausea and vomiting or a severe headache please notify your doctor immediately.  FOLLOW-UP:  Please make an appointment with your surgeon as instructed. You do not need to follow up with anesthesia unless specifically instructed to do so.  WOUND CARE INSTRUCTIONS (if applicable):  Keep a dry clean dressing on the anesthesia/puncture wound site if there is drainage.  Once the wound has quit draining you may leave it open to air.  Generally  you should leave the bandage intact for twenty four hours unless there is drainage.  If the epidural site drains for more than 36-48 hours please call the anesthesia department.  QUESTIONS?:  Please feel free to call your physician or the hospital operator if you have any questions, and they will be happy to assist you.      Esophagogastroduodenoscopy Esophagogastroduodenoscopy (EGD) is a procedure that is used to examine the lining of the esophagus, stomach, and first part of the small intestine (duodenum). A long, flexible, lighted tube with a camera attached (endoscope) is inserted down the throat to view these organs. This procedure is done to detect problems or abnormalities, such as inflammation, bleeding, ulcers, or growths, in order to treat them. The procedure lasts 5-20 minutes. It is usually an outpatient procedure, but it may need to be performed in a hospital in emergency cases. LET [facility] CARE PROVIDER KNOW ABOUT:  Any allergies you have.  All medicines you are taking, including vitamins, herbs, eye drops, creams, and over-the-counter medicines.  Previous problems you or members of your family have had with the use of anesthetics.  Any blood disorders you have.  Previous surgeries you have had.  Medical conditions you have. RISKS AND COMPLICATIONS Generally, this is a safe procedure. However, problems can occur and include:  Infection.  Bleeding.  Tearing (perforation) of the esophagus, stomach, or duodenum.  Difficulty breathing or not being able to breathe.  Excessive sweating.  Spasms of the larynx.  Slowed heartbeat.  Low blood pressure. BEFORE THE PROCEDURE  Do not eat or drink anything after midnight on the night before the procedure or as directed by your health care provider.  Do not take your regular medicines before the procedure if your health care provider asks you not to. Ask your health care provider about changing or stopping those  medicines.  If you wear dentures, be prepared to remove them before the procedure.  Arrange for someone to drive you home after the procedure. PROCEDURE  A numbing medicine (local anesthetic) may be sprayed in your throat for comfort and to stop you from gagging or coughing.  You will have an IV tube inserted in a vein in your hand or arm. You will receive medicines and fluids through this tube.  You will be given a medicine to relax you (sedative).  A pain reliever will be given through the IV tube.  A mouth guard may be placed in your mouth to protect your teeth and to keep you from biting on the endoscope.  You will be asked to lie on your left side.  The endoscope will be inserted down your throat and into your esophagus, stomach, and duodenum.  Air will be put through the endoscope to allow your health care provider to clearly view the lining of your esophagus.  The lining of your esophagus, stomach, and duodenum will be examined. During the exam, your health care provider may:  Remove tissue to be examined under a microscope (biopsy) for inflammation, infection, or other medical problems.  Remove growths.  Remove objects (foreign bodies) that are stuck.  Treat any bleeding with medicines or other devices that stop tissues from bleeding (hot cautery, clipping devices).  Widen (dilate) or stretch narrowed areas of your esophagus and stomach.  The endoscope will be withdrawn. AFTER THE PROCEDURE  You will be taken to a recovery area for observation. Your blood pressure, heart rate, breathing rate, and blood oxygen level will be monitored often until the medicines you were given have worn off.  Do not eat or drink anything until the numbing medicine has worn off and your gag reflex has returned. You may choke.  Your health care provider should be able to discuss his or her findings with you. It will take longer to discuss the test results if any biopsies were taken.    This information is not intended to replace advice given to you by your health care provider. Make sure you discuss any questions you have with your health care provider.   Document Released: 10/15/2004 Document Revised: 07/05/2014 Document Reviewed: 05/17/2012 Elsevier Interactive Patient Education [employer].
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
## 2015-05-13 NOTE — Assessment & Plan Note (Signed)
69 year old male with NASH cirrhosis, multiple comorbidities, now with acute on chronic abdominal pain but unrevealing CT, [place], and labs. As of note, chronic history of IDA with drifting ferritin recently. With worsening epigastric pain and nausea, will proceed with an EGD first. Last EGD July 2015 as noted above. Query a musculoskeletal component as well. Weight remains stable. If EGD is unrevealing, could consider CTA to evaluate mesenteric vasculature.   Proceed with upper endoscopy in the near future with Dr. [name]. The risks, benefits, and alternatives have been discussed in detail with patient. They have stated understanding and desire to proceed.  PROPOFOL due to polypharmacy Continue Nexium BID Short course of Carafate in interim

## 2015-05-14 ENCOUNTER — Encounter (HOSPITAL_COMMUNITY): Payer: Self-pay

## 2015-05-14 ENCOUNTER — Encounter (HOSPITAL_COMMUNITY)
Admission: RE | Admit: 2015-05-14 | Discharge: 2015-05-14 | Disposition: A | Discharge status: Home / Self Care | Payer: Medicare Other | Source: Ambulatory Visit | Attending: Internal Medicine | Admitting: Internal Medicine

## 2015-05-14 DIAGNOSIS — Z01818 Encounter for other preprocedural examination: Secondary | ICD-10-CM | POA: Diagnosis not present

## 2015-05-14 DIAGNOSIS — R1013 Epigastric pain: Secondary | ICD-10-CM | POA: Insufficient documentation

## 2015-05-14 LAB — CBC
HEMATOCRIT: 28.6 % — AB (ref 39.0–52.0)
HEMOGLOBIN: 8.4 g/dL — AB (ref 13.0–17.0)
MCH: 26.5 pg (ref 26.0–34.0)
MCHC: 29.4 g/dL — AB (ref 30.0–36.0)
MCV: 90.2 fL (ref 78.0–100.0)
Platelets: 91 10*3/uL — ABNORMAL LOW (ref 150–400)
RBC: 3.17 MIL/uL — AB (ref 4.22–5.81)
RDW: 14.8 % (ref 11.5–15.5)
WBC: 3 10*3/uL — ABNORMAL LOW (ref 4.0–10.5)

## 2015-05-14 NOTE — Progress Notes (Signed)
CC'ED TO PCP 

## 2015-05-14 NOTE — Pre-Procedure Instructions (Signed)
Brad Wall notified of results from CBC today.  Was noted and states he may proceed with EGD on 11/21.

## 2015-05-15 NOTE — Pre-Procedure Instructions (Signed)
Dr Gonzalez aware of labs. No orders given. 

## 2015-05-19 ENCOUNTER — Ambulatory Visit (HOSPITAL_COMMUNITY): Payer: Medicare Other | Admitting: Anesthesiology

## 2015-05-19 ENCOUNTER — Other Ambulatory Visit: Payer: Self-pay

## 2015-05-19 ENCOUNTER — Encounter (HOSPITAL_COMMUNITY): Payer: Self-pay | Admitting: *Deleted

## 2015-05-19 ENCOUNTER — Other Ambulatory Visit: Payer: Self-pay | Admitting: Internal Medicine

## 2015-05-19 ENCOUNTER — Ambulatory Visit (HOSPITAL_COMMUNITY)
Admission: RE | Admit: 2015-05-19 | Discharge: 2015-05-19 | Disposition: A | Discharge status: Home / Self Care | Payer: Medicare Other | Source: Ambulatory Visit | Attending: Internal Medicine | Admitting: Internal Medicine

## 2015-05-19 ENCOUNTER — Encounter (HOSPITAL_COMMUNITY)
Admission: RE | Disposition: A | Discharge status: Home / Self Care | Payer: Self-pay | Source: Ambulatory Visit | Attending: Internal Medicine

## 2015-05-19 DIAGNOSIS — Z7982 Long term (current) use of aspirin: Secondary | ICD-10-CM | POA: Diagnosis not present

## 2015-05-19 DIAGNOSIS — K3189 Other diseases of stomach and duodenum: Secondary | ICD-10-CM | POA: Diagnosis not present

## 2015-05-19 DIAGNOSIS — K7460 Unspecified cirrhosis of liver: Secondary | ICD-10-CM | POA: Diagnosis not present

## 2015-05-19 DIAGNOSIS — Z8673 Personal history of transient ischemic attack (TIA), and cerebral infarction without residual deficits: Secondary | ICD-10-CM | POA: Diagnosis not present

## 2015-05-19 DIAGNOSIS — Z794 Long term (current) use of insulin: Secondary | ICD-10-CM | POA: Diagnosis not present

## 2015-05-19 DIAGNOSIS — K219 Gastro-esophageal reflux disease without esophagitis: Secondary | ICD-10-CM | POA: Diagnosis not present

## 2015-05-19 DIAGNOSIS — K2289 Other specified disease of esophagus: Secondary | ICD-10-CM | POA: Insufficient documentation

## 2015-05-19 DIAGNOSIS — F419 Anxiety disorder, unspecified: Secondary | ICD-10-CM | POA: Insufficient documentation

## 2015-05-19 DIAGNOSIS — R1013 Epigastric pain: Secondary | ICD-10-CM | POA: Diagnosis present

## 2015-05-19 DIAGNOSIS — E1151 Type 2 diabetes mellitus with diabetic peripheral angiopathy without gangrene: Secondary | ICD-10-CM | POA: Insufficient documentation

## 2015-05-19 DIAGNOSIS — K7581 Nonalcoholic steatohepatitis (NASH): Secondary | ICD-10-CM | POA: Insufficient documentation

## 2015-05-19 DIAGNOSIS — Z87891 Personal history of nicotine dependence: Secondary | ICD-10-CM | POA: Diagnosis not present

## 2015-05-19 DIAGNOSIS — K319 Disease of stomach and duodenum, unspecified: Secondary | ICD-10-CM | POA: Diagnosis not present

## 2015-05-19 DIAGNOSIS — I10 Essential (primary) hypertension: Secondary | ICD-10-CM | POA: Insufficient documentation

## 2015-05-19 DIAGNOSIS — E785 Hyperlipidemia, unspecified: Secondary | ICD-10-CM | POA: Diagnosis not present

## 2015-05-19 DIAGNOSIS — K229 Disease of esophagus, unspecified: Secondary | ICD-10-CM | POA: Insufficient documentation

## 2015-05-19 DIAGNOSIS — M549 Dorsalgia, unspecified: Secondary | ICD-10-CM | POA: Diagnosis not present

## 2015-05-19 DIAGNOSIS — G8929 Other chronic pain: Secondary | ICD-10-CM | POA: Insufficient documentation

## 2015-05-19 DIAGNOSIS — Z79891 Long term (current) use of opiate analgesic: Secondary | ICD-10-CM | POA: Insufficient documentation

## 2015-05-19 DIAGNOSIS — E538 Deficiency of other specified B group vitamins: Secondary | ICD-10-CM | POA: Diagnosis not present

## 2015-05-19 DIAGNOSIS — K766 Portal hypertension: Secondary | ICD-10-CM

## 2015-05-19 DIAGNOSIS — R109 Unspecified abdominal pain: Secondary | ICD-10-CM

## 2015-05-19 DIAGNOSIS — F329 Major depressive disorder, single episode, unspecified: Secondary | ICD-10-CM | POA: Insufficient documentation

## 2015-05-19 DIAGNOSIS — Z79899 Other long term (current) drug therapy: Secondary | ICD-10-CM | POA: Insufficient documentation

## 2015-05-19 HISTORY — PX: ESOPHAGOGASTRODUODENOSCOPY (EGD) WITH PROPOFOL: SHX5813

## 2015-05-19 LAB — KOH PREP

## 2015-05-19 LAB — GLUCOSE, CAPILLARY: Glucose-Capillary: 149 mg/dL — ABNORMAL HIGH (ref 65–99)

## 2015-05-19 SURGERY — ESOPHAGOGASTRODUODENOSCOPY (EGD) WITH PROPOFOL
Anesthesia: Monitor Anesthesia Care

## 2015-05-19 MED ORDER — SIMETHICONE 40 MG/0.6ML PO SUSP
ORAL | Status: DC | PRN
  Administered 2015-05-19: 1000 mL

## 2015-05-19 MED ORDER — MIDAZOLAM HCL 5 MG/5ML IJ SOLN
INTRAMUSCULAR | Status: DC | PRN
  Administered 2015-05-19 (×2): 1 mg via INTRAVENOUS

## 2015-05-19 MED ORDER — FENTANYL CITRATE (PF) 100 MCG/2ML IJ SOLN
25.0000 ug | INTRAMUSCULAR | Status: AC
  Administered 2015-05-19: 25 ug via INTRAVENOUS

## 2015-05-19 MED ORDER — LACTATED RINGERS IV SOLN
INTRAVENOUS | Status: DC
  Administered 2015-05-19: 1000 mL via INTRAVENOUS

## 2015-05-19 MED ORDER — LIDOCAINE HCL (CARDIAC) 10 MG/ML IV SOLN
INTRAVENOUS | Status: DC | PRN
  Administered 2015-05-19: 50 mg via INTRAVENOUS

## 2015-05-19 MED ORDER — LIDOCAINE VISCOUS 2 % MT SOLN
OROMUCOSAL | Status: AC
  Filled 2015-05-19: qty 15

## 2015-05-19 MED ORDER — MIDAZOLAM HCL 2 MG/2ML IJ SOLN
INTRAMUSCULAR | Status: AC
  Filled 2015-05-19: qty 2

## 2015-05-19 MED ORDER — MIDAZOLAM HCL 2 MG/2ML IJ SOLN
1.0000 mg | INTRAMUSCULAR | Status: DC | PRN
  Administered 2015-05-19: 2 mg via INTRAVENOUS

## 2015-05-19 MED ORDER — ONDANSETRON HCL 4 MG/2ML IJ SOLN: 4.0000 mg | Freq: Once | INTRAMUSCULAR | Status: DC | PRN

## 2015-05-19 MED ORDER — FENTANYL CITRATE (PF) 100 MCG/2ML IJ SOLN
INTRAMUSCULAR | Status: AC
  Filled 2015-05-19: qty 2

## 2015-05-19 MED ORDER — ONDANSETRON HCL 4 MG/2ML IJ SOLN
4.0000 mg | Freq: Once | INTRAMUSCULAR | Status: AC
  Administered 2015-05-19: 4 mg via INTRAVENOUS

## 2015-05-19 MED ORDER — PROPOFOL 500 MG/50ML IV EMUL
INTRAVENOUS | Status: DC | PRN
  Administered 2015-05-19: 150 ug/kg/min via INTRAVENOUS

## 2015-05-19 MED ORDER — FENTANYL CITRATE (PF) 100 MCG/2ML IJ SOLN: 25.0000 ug | INTRAMUSCULAR | Status: DC | PRN

## 2015-05-19 MED ORDER — ONDANSETRON HCL 4 MG/2ML IJ SOLN
INTRAMUSCULAR | Status: AC
  Filled 2015-05-19: qty 2

## 2015-05-19 MED ORDER — LIDOCAINE VISCOUS 2 % MT SOLN
3.0000 mL | OROMUCOSAL | Status: AC | PRN
  Administered 2015-05-19 (×2): 3 mL via OROMUCOSAL

## 2015-05-19 MED ORDER — PROPOFOL 10 MG/ML IV BOLUS
INTRAVENOUS | Status: AC
  Filled 2015-05-19: qty 20

## 2015-05-19 SURGICAL SUPPLY — 22 items
BLOCK BITE 60FR ADLT L/F BLUE (MISCELLANEOUS) ×2 IMPLANT
BRUSH CYTOL RX .035 2.1X8X200 (MISCELLANEOUS) ×2 IMPLANT
DEVICE CLIP HEMOSTAT 235CM (CLIP) IMPLANT
ELECT REM PT RETURN 9FT ADLT (ELECTROSURGICAL)
ELECTRODE REM PT RTRN 9FT ADLT (ELECTROSURGICAL) IMPLANT
FLOOR PAD 36X40 (MISCELLANEOUS)
FORCEPS BIOP RAD 4 LRG CAP 4 (CUTTING FORCEPS) IMPLANT
FORMALIN 10 PREFIL 20ML (MISCELLANEOUS) ×2 IMPLANT
KIT ENDO PROCEDURE PEN (KITS) ×2 IMPLANT
MANIFOLD NEPTUNE II (INSTRUMENTS) ×2 IMPLANT
NEEDLE SCLEROTHERAPY 25GX240 (NEEDLE) IMPLANT
PAD FLOOR 36X40 (MISCELLANEOUS) IMPLANT
PROBE APC STR FIRE (PROBE) IMPLANT
PROBE INJECTION GOLD (MISCELLANEOUS)
PROBE INJECTION GOLD 7FR (MISCELLANEOUS) IMPLANT
SNARE ROTATE MED OVAL 20MM (MISCELLANEOUS) IMPLANT
SNARE SHORT THROW 13M SML OVAL (MISCELLANEOUS) IMPLANT
SYR 50ML LL SCALE MARK (SYRINGE) ×2 IMPLANT
SYR INFLATION 60ML (SYRINGE) IMPLANT
TUBING INSUFFLATOR CO2MPACT (TUBING) ×2 IMPLANT
TUBING IRRIGATION ENDOGATOR (MISCELLANEOUS) ×2 IMPLANT
WATER STERILE IRR 1000ML POUR (IV SOLUTION) ×2 IMPLANT

## 2015-05-19 NOTE — Transfer of Care (Signed)
Immediate Anesthesia Transfer of Care Note  Patient: Brad Wall  Procedure(s) Performed: Procedure(s) with comments: ESOPHAGOGASTRODUODENOSCOPY (EGD) WITH PROPOFOL (N/A) - 815  Patient Location: PACU  Anesthesia Type:MAC  Level of Consciousness: awake, alert , oriented and patient cooperative  Airway & Oxygen Therapy: Patient Spontanous Breathing and Patient connected to face mask oxygen  Post-op Assessment: Report given to RN and Post -op Vital signs reviewed and stable  Post vital signs: Reviewed and stable  Last Vitals:  Filed Vitals:   05/19/15 0800 05/19/15 0805  BP: 111/71 104/65  Temp:    Resp: 19 18    Complications: No apparent anesthesia complications
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
## 2015-05-19 NOTE — Discharge Instructions (Signed)
EGD Discharge instructions Please read the instructions outlined below and refer to this sheet in the next few weeks. These discharge instructions provide you with general information on caring for yourself after you leave the hospital. Your doctor may also give you specific instructions. While your treatment has been planned according to the most current medical practices available, unavoidable complications occasionally occur. If you have any problems or questions after discharge, please call your doctor. ACTIVITY  You may resume your regular activity but move at a slower pace for the next 24 hours.   Take frequent rest periods for the next 24 hours.   Walking will help expel (get rid of) the air and reduce the bloated feeling in your abdomen.   No driving for 24 hours (because of the anesthesia (medicine) used during the test).   You may shower.   Do not sign any important legal documents or operate any machinery for 24 hours (because of the anesthesia used during the test).  NUTRITION  Drink plenty of fluids.   You may resume your normal diet.   Begin with a light meal and progress to your normal diet.   Avoid alcoholic beverages for 24 hours or as instructed by your caregiver.  MEDICATIONS  You may resume your normal medications unless your caregiver tells you otherwise.  WHAT YOU CAN EXPECT TODAY  You may experience abdominal discomfort such as a feeling of fullness or gas pains.  FOLLOW-UP  Your doctor will discuss the results of your test with you.  SEEK IMMEDIATE MEDICAL ATTENTION IF ANY OF THE FOLLOWING OCCUR:  Excessive nausea (feeling sick to your stomach) and/or vomiting.   Severe abdominal pain and distention (swelling).   Trouble swallowing.   Temperature over 101 F (37.8 C).   Rectal bleeding or vomiting of blood.    CT angiogram of the abdomen and pelvis to further evaluate postprandial abdominal pain  Further recommendations to follow once  laboratory results are available

## 2015-05-19 NOTE — Anesthesia Procedure Notes (Signed)
Procedure Name: MAC Date/Time: 05/19/2015 8:09 AM Performed by: Andree Elk, AMY A Pre-anesthesia Checklist: Patient identified, Timeout performed, Emergency Drugs available, Suction available and Patient being monitored Oxygen Delivery Method: Simple face mask

## 2015-05-19 NOTE — Op Note (Signed)
[facility] [address] [place], [address]   ENDOSCOPY PROCEDURE REPORT  PATIENT: Brad Wall, Brad Wall  MR#: [id number] BIRTHDATE: 09-Dec-1945 , 89  yrs. old GENDER: male ENDOSCOPIST: R.  Garfield Cornea, MD FACP FACG REFERRED BY:  Lemmie Evens, M.D. PROCEDURE DATE:  05/30/15 PROCEDURE:  EGD, diagnostic with KOH brushing esophagus INDICATIONS:  epigastric pain. MEDICATIONS: Deep sedation per Dr.  [facility] ASA CLASS:      Class III  CONSENT: The risks, benefits, limitations, alternatives and imponderables have been discussed.  The potential for biopsy, esophogeal dilation, etc. have also been reviewed.  Questions have been answered.  All parties agreeable.  Please see the history and physical in the medical record for more information.  DESCRIPTION OF PROCEDURE: After the risks benefits and alternatives of the procedure were thoroughly explained, informed consent was obtained.  The    endoscope was introduced through the mouth and advanced to the second portion of the duodenum , limited by Without limitations. The instrument was slowly withdrawn as the mucosa was fully examined. Estimated blood loss is zero unless otherwise noted in this procedure report.    Numerous white somewhat thick solitary esophageal plaques.  They were not fluid.  Persisting grade 1 esophageal varices?"4 columns. Otherwise up to because.  Unremarkable.  Stomach empty.  Gastric mucosa diffusely congested and friable with snake skinning or fish scalel appearance consistent with portal gastropathy.  No ulcer or infiltrating process observed.  No gastric varices.Patent pylorus. Normal appearing First and second portion of the duodenum. Retroflexed views revealed as previously described.     The scope was then withdrawn from the patient and the procedure completed. Esophageal mucosa brushed  for KOH preparation.  COMPLICATIONS: There were no immediate  complications.  ENDOSCOPIC IMPRESSION: Esophageal plaques status post KOH brushing. Gastropathy. I doubt potential Candida esophagitis  would explain all of his abdominal pain adequately  RECOMMENDATIONS: Follow-up on KOH brushing. Proceed with a CT angiogram of the abdomen and pelvis to evaluate mesenteric vasculature. Further recommendations to follow.  REPEAT EXAM:  eSigned:  R. Garfield Cornea, MD Rosalita Chessman [facility] 2015/05/30 8:38 AM    CC:  CPT CODES: ICD CODES:  The ICD and CPT codes recommended by this software are interpretations from the data that the clinical staff has captured with the software.  The verification of the translation of this report to the ICD and CPT codes and modifiers is the sole responsibility of the health care institution and practicing physician where this report was generated.  [employer]. will not be held responsible for the validity of the ICD and CPT codes included on this report.  AMA assumes no liability for data contained or not contained herein. CPT is a Designer, television/film set of the [employer].  PATIENT NAME:  [name], [name] MR#: [id number]

## 2015-05-19 NOTE — Anesthesia Postprocedure Evaluation (Signed)
  Anesthesia Post-op Note  Patient: Brad Wall  Procedure(s) Performed: Procedure(s) with comments: ESOPHAGOGASTRODUODENOSCOPY (EGD) WITH PROPOFOL (N/A) - 815  Patient Location: PACU  Anesthesia Type:MAC  Level of Consciousness: awake, alert , oriented and patient cooperative  Airway and Oxygen Therapy: Patient Spontanous Breathing and Patient connected to face mask oxygen  Post-op Pain: none  Post-op Assessment: Post-op Vital signs reviewed, Patient's Cardiovascular Status Stable, Respiratory Function Stable, Patent Airway, No signs of Nausea or vomiting and No headache              Post-op Vital Signs: Reviewed and stable  Last Vitals:  Filed Vitals:   05/19/15 0800 05/19/15 0805  BP: 111/71 104/65  Temp:    Resp: 19 18    Complications: No apparent anesthesia complications

## 2015-05-19 NOTE — Interval H&P Note (Signed)
History and Physical Interval Note:  05/19/2015 8:04 AM  Brad Wall  has presented today for surgery, with the diagnosis of DYSPEPSIA  The various methods of treatment have been discussed with the patient and family. After consideration of risks, benefits and other options for treatment, the patient has consented to  Procedure(s) with comments: ESOPHAGOGASTRODUODENOSCOPY (EGD) WITH PROPOFOL (N/A) - 815 as a surgical intervention .  The patient's history has been reviewed, patient examined, no change in status, stable for surgery.  I have reviewed the patient's chart and labs.  Questions were answered to the patient's satisfaction.     Brad Wall  No change. Diagnostic EGD per plan.  The risks, benefits, limitations, alternatives and imponderables have been reviewed with the patient. Potential for esophageal dilation, biopsy, etc. have also been reviewed.  Questions have been answered. All parties agreeable.

## 2015-05-19 NOTE — Anesthesia Preprocedure Evaluation (Addendum)
Anesthesia Evaluation  Patient identified by MRN, date of birth, ID band Patient awake    Reviewed: NPO status , Patient's Chart, lab work & pertinent test results, reviewed documented beta blocker date and time   History of Anesthesia Complications Negative for: history of anesthetic complications  Airway Mallampati: II  TM Distance: >3 FB Neck ROM: Full    Dental  (+) Poor Dentition, Chipped, Missing, Loose, Dental Advisory Given   Pulmonary former smoker,    breath sounds clear to auscultation       Cardiovascular hypertension, Pt. on medications and Pt. on home beta blockers + Peripheral Vascular Disease   Rhythm:Regular Rate:Normal     Neuro/Psych  Headaches, PSYCHIATRIC DISORDERS Anxiety Depression CVA, Residual Symptoms    GI/Hepatic GERD  ,(+) Hepatitis -  Endo/Other  diabetes, Well Controlled, Type 2, Oral Hypoglycemic Agents  Renal/GU      Musculoskeletal   Abdominal   Peds  Hematology   Anesthesia Other Findings   Reproductive/Obstetrics                            Anesthesia Physical Anesthesia Plan  ASA: III  Anesthesia Plan: MAC   Post-op Pain Management:    Induction: Intravenous  Airway Management Planned: Simple Face Mask  Additional Equipment:   Intra-op Plan:   Post-operative Plan:   Informed Consent: I have reviewed the patients History and Physical, chart, labs and discussed the procedure including the risks, benefits and alternatives for the proposed anesthesia with the patient or authorized representative who has indicated his/her understanding and acceptance.     Plan Discussed with:   Anesthesia Plan Comments:         Anesthesia Quick Evaluation

## 2015-05-20 ENCOUNTER — Encounter (HOSPITAL_COMMUNITY): Payer: Self-pay | Admitting: Internal Medicine

## 2015-05-21 NOTE — Progress Notes (Signed)
Quick Note:  Yes, make sure he has cut the dose in half for the atorvastatin, celexa, and gemfibrozil. May call in the prescription as outlined by Dr. [name]. He may resume normal dosing of those meds after Diflucan completed. ______

## 2015-05-26 ENCOUNTER — Ambulatory Visit (HOSPITAL_COMMUNITY)
Admission: RE | Admit: 2015-05-26 | Discharge: 2015-05-26 | Disposition: A | Discharge status: Home / Self Care | Payer: Medicare Other | Source: Ambulatory Visit | Attending: Internal Medicine | Admitting: Internal Medicine

## 2015-05-26 DIAGNOSIS — R110 Nausea: Secondary | ICD-10-CM | POA: Insufficient documentation

## 2015-05-26 DIAGNOSIS — I864 Gastric varices: Secondary | ICD-10-CM | POA: Diagnosis not present

## 2015-05-26 DIAGNOSIS — R161 Splenomegaly, not elsewhere classified: Secondary | ICD-10-CM | POA: Insufficient documentation

## 2015-05-26 DIAGNOSIS — I8500 Esophageal varices without bleeding: Secondary | ICD-10-CM | POA: Diagnosis not present

## 2015-05-26 DIAGNOSIS — R109 Unspecified abdominal pain: Secondary | ICD-10-CM

## 2015-05-26 DIAGNOSIS — R1010 Upper abdominal pain, unspecified: Secondary | ICD-10-CM | POA: Diagnosis not present

## 2015-05-26 DIAGNOSIS — K7460 Unspecified cirrhosis of liver: Secondary | ICD-10-CM | POA: Insufficient documentation

## 2015-05-26 MED ORDER — IOHEXOL 350 MG/ML SOLN
100.0000 mL | Freq: Once | INTRAVENOUS | Status: AC | PRN
  Administered 2015-05-26: 100 mL via INTRAVENOUS

## 2015-05-26 MED ORDER — IOHEXOL 300 MG/ML  SOLN: 100.0000 mL | Freq: Once | INTRAMUSCULAR | Status: DC | PRN

## 2015-05-27 ENCOUNTER — Telehealth: Payer: Self-pay

## 2015-05-27 NOTE — Telephone Encounter (Signed)
Spoke with the pt and his wife about the ct angio results. Pt said the carafate tablets seemed to help the pain some and they would like to know if they can have a refill sent to [place]?

## 2015-05-27 NOTE — Telephone Encounter (Signed)
Noted. We can refill Carafate 1 gm  a.c. And at bedtime-dispense 120 doses with 11 refills

## 2015-05-28 MED ORDER — SUCRALFATE 1 G PO TABS: 1.0000 g | ORAL_TABLET | Freq: Four times a day (QID) | ORAL | Status: DC

## 2015-05-28 NOTE — Telephone Encounter (Signed)
rx has been sent to the pharmacy. 
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
## 2015-06-10 ENCOUNTER — Encounter (HOSPITAL_COMMUNITY): Payer: Medicare Other | Attending: Hematology and Oncology | Admitting: Hematology & Oncology

## 2015-06-10 ENCOUNTER — Encounter (HOSPITAL_COMMUNITY): Payer: Medicare Other

## 2015-06-10 ENCOUNTER — Encounter (HOSPITAL_COMMUNITY): Payer: Self-pay | Admitting: Hematology & Oncology

## 2015-06-10 VITALS — BP 121/73 | HR 88 | Temp 97.7°F | Resp 18 | Wt 156.8 lb

## 2015-06-10 DIAGNOSIS — D696 Thrombocytopenia, unspecified: Secondary | ICD-10-CM | POA: Insufficient documentation

## 2015-06-10 DIAGNOSIS — K7460 Unspecified cirrhosis of liver: Secondary | ICD-10-CM | POA: Diagnosis not present

## 2015-06-10 DIAGNOSIS — R161 Splenomegaly, not elsewhere classified: Secondary | ICD-10-CM

## 2015-06-10 DIAGNOSIS — D649 Anemia, unspecified: Secondary | ICD-10-CM

## 2015-06-10 DIAGNOSIS — D509 Iron deficiency anemia, unspecified: Secondary | ICD-10-CM | POA: Diagnosis not present

## 2015-06-10 DIAGNOSIS — K7682 Hepatic encephalopathy: Secondary | ICD-10-CM

## 2015-06-10 DIAGNOSIS — R5383 Other fatigue: Secondary | ICD-10-CM

## 2015-06-10 DIAGNOSIS — K7290 Hepatic failure, unspecified without coma: Secondary | ICD-10-CM

## 2015-06-10 LAB — COMPREHENSIVE METABOLIC PANEL
ALK PHOS: 181 U/L — AB (ref 38–126)
ALT: 44 U/L (ref 17–63)
ANION GAP: 9 (ref 5–15)
AST: 54 U/L — ABNORMAL HIGH (ref 15–41)
Albumin: 4.3 g/dL (ref 3.5–5.0)
BILIRUBIN TOTAL: 0.7 mg/dL (ref 0.3–1.2)
BUN: 13 mg/dL (ref 6–20)
CALCIUM: 9.4 mg/dL (ref 8.9–10.3)
CO2: 21 mmol/L — ABNORMAL LOW (ref 22–32)
CREATININE: 0.96 mg/dL (ref 0.61–1.24)
Chloride: 105 mmol/L (ref 101–111)
GFR calc Af Amer: >60 mL/min (ref >60)
GLUCOSE: 184 mg/dL — AB (ref 65–99)
POTASSIUM: 4.1 mmol/L (ref 3.5–5.1)
Sodium: 135 mmol/L (ref 135–145)
TOTAL PROTEIN: 7.7 g/dL (ref 6.5–8.1)

## 2015-06-10 LAB — CBC WITH DIFFERENTIAL/PLATELET
Basophils Absolute: 0 10*3/uL (ref 0.0–0.1)
Basophils Relative: 1 %
Eosinophils Absolute: 0.1 10*3/uL (ref 0.0–0.7)
Eosinophils Relative: 3 %
HEMATOCRIT: 28.2 % — AB (ref 39.0–52.0)
HEMOGLOBIN: 8.3 g/dL — AB (ref 13.0–17.0)
LYMPHS ABS: 0.6 10*3/uL — AB (ref 0.7–4.0)
LYMPHS PCT: 14 %
MCH: 24.6 pg — AB (ref 26.0–34.0)
MCHC: 29.4 g/dL — AB (ref 30.0–36.0)
MCV: 83.7 fL (ref 78.0–100.0)
MONO ABS: 0.4 10*3/uL (ref 0.1–1.0)
MONOS PCT: 8 %
NEUTROS ABS: 3.2 10*3/uL (ref 1.7–7.7)
NEUTROS PCT: 75 %
Platelets: 96 10*3/uL — ABNORMAL LOW (ref 150–400)
RBC: 3.37 MIL/uL — ABNORMAL LOW (ref 4.22–5.81)
RDW: 15.4 % (ref 11.5–15.5)
WBC: 4.3 10*3/uL (ref 4.0–10.5)

## 2015-06-10 LAB — FERRITIN: FERRITIN: 7 ng/mL — AB (ref 24–336)

## 2015-06-10 MED ORDER — RIFAXIMIN 550 MG PO TABS: 550.0000 mg | ORAL_TABLET | Freq: Two times a day (BID) | ORAL | Status: AC

## 2015-06-10 NOTE — Addendum Note (Signed)
Addended by: Elenor Legato on: 06/10/2015 04:07 PM   Modules accepted: Orders, SmartSet

## 2015-06-10 NOTE — Patient Instructions (Addendum)
[place] at [facility] Discharge Instructions  RECOMMENDATIONS MADE BY THE CONSULTANT AND ANY TEST RESULTS WILL BE SENT TO YOUR REFERRING PHYSICIAN.   Exam completed by Dr Whitney Muse today Blood work today We will call you with those results If your hemoglobin is about the same, we can transfuse  Blood work every 8 weeks Return to see the doctor in 3 months  Please call the clinic if you have any questions or concerns    Thank you for choosing [place] at [facility] to provide your oncology and hematology care.  To afford each patient quality time with our provider, please arrive at least 15 minutes before your scheduled appointment time.    You need to re-schedule your appointment should you arrive 10 or more minutes late.  We strive to give you quality time with our providers, and arriving late affects you and other patients whose appointments are after yours.  Also, if you no show three or more times for appointments you may be dismissed from the clinic at the providers discretion.     Again, thank you for choosing [facility].  Our hope is that these requests will decrease the amount of time that you wait before being seen by our physicians.       _____________________________________________________________  Should you have questions after your visit to [facility], please contact our office at [phone] between the hours of 8:30 a.m. and 4:30 p.m.  Voicemails left after 4:30 p.m. will not be returned until the following business day.  For prescription refill requests, have your pharmacy contact our office.

## 2015-06-11 ENCOUNTER — Other Ambulatory Visit (HOSPITAL_COMMUNITY): Payer: Self-pay | Admitting: Hematology & Oncology

## 2015-06-11 ENCOUNTER — Encounter (HOSPITAL_BASED_OUTPATIENT_CLINIC_OR_DEPARTMENT_OTHER): Payer: Medicare Other

## 2015-06-11 DIAGNOSIS — D509 Iron deficiency anemia, unspecified: Secondary | ICD-10-CM | POA: Diagnosis not present

## 2015-06-11 LAB — PREPARE RBC (CROSSMATCH)

## 2015-06-11 LAB — ABO/RH: ABO/RH(D): O NEG

## 2015-06-12 NOTE — Progress Notes (Signed)
LABS DRAWN

## 2015-06-13 ENCOUNTER — Encounter (HOSPITAL_BASED_OUTPATIENT_CLINIC_OR_DEPARTMENT_OTHER): Payer: Medicare Other

## 2015-06-13 ENCOUNTER — Encounter (HOSPITAL_COMMUNITY): Payer: Self-pay

## 2015-06-13 VITALS — BP 112/60 | HR 84 | Temp 99.0°F | Resp 16

## 2015-06-13 DIAGNOSIS — D509 Iron deficiency anemia, unspecified: Secondary | ICD-10-CM

## 2015-06-13 MED ORDER — SODIUM CHLORIDE 0.9 % IV SOLN
250.0000 mL | Freq: Once | INTRAVENOUS | Status: AC
  Administered 2015-06-13: 250 mL via INTRAVENOUS

## 2015-06-13 MED ORDER — DIPHENHYDRAMINE HCL 25 MG PO CAPS
25.0000 mg | ORAL_CAPSULE | Freq: Once | ORAL | Status: AC
  Administered 2015-06-13: 25 mg via ORAL
  Filled 2015-06-13: qty 1

## 2015-06-13 MED ORDER — SODIUM CHLORIDE 0.9 % IJ SOLN
10.0000 mL | INTRAMUSCULAR | Status: AC | PRN
  Administered 2015-06-13: 10 mL

## 2015-06-13 MED ORDER — ACETAMINOPHEN 325 MG PO TABS
650.0000 mg | ORAL_TABLET | Freq: Once | ORAL | Status: AC
Start: 2015-06-13 — End: 2015-06-13
  Administered 2015-06-13: 650 mg via ORAL
  Filled 2015-06-13: qty 2

## 2015-06-13 MED ORDER — SODIUM CHLORIDE 0.9 % IV SOLN
510.0000 mg | Freq: Once | INTRAVENOUS | Status: AC
  Administered 2015-06-13: 510 mg via INTRAVENOUS
  Filled 2015-06-13: qty 17

## 2015-06-13 NOTE — Progress Notes (Signed)
[id number]:  Called to room by pt - reports chest pain, left sternal border, aching, non-radiating, rates 2/10.  Blood transfusion stopped and NS infusing.  Dr. Whitney Muse notified.  Instructed to rechallenge pt with blood transfusion when symptoms subside.   1000:  Denies pain.  Transfusion restarted at 186ml/h to infuse 18ml over 15 minutes.   1430:  Tolerated transfusion and iron infusion w/o adverse reaction.  A&Ox4, in no distress.  VSS.  Discharged ambulatory.

## 2015-06-13 NOTE — Patient Instructions (Signed)
[place] at [facility] Discharge Instructions  RECOMMENDATIONS MADE BY THE CONSULTANT AND ANY TEST RESULTS WILL BE SENT TO YOUR REFERRING PHYSICIAN.  Blood transfusion 2 units and iron infusion today. Return as scheduled for lab work and office visit.   Thank you for choosing [place] at [facility] to provide your oncology and hematology care.  To afford each patient quality time with our provider, please arrive at least 15 minutes before your scheduled appointment time.    You need to re-schedule your appointment should you arrive 10 or more minutes late.  We strive to give you quality time with our providers, and arriving late affects you and other patients whose appointments are after yours.  Also, if you no show three or more times for appointments you may be dismissed from the clinic at the providers discretion.     Again, thank you for choosing [facility].  Our hope is that these requests will decrease the amount of time that you wait before being seen by our physicians.       _____________________________________________________________  Should you have questions after your visit to [facility], please contact our office at [phone] between the hours of 8:30 a.m. and 4:30 p.m.  Voicemails left after 4:30 p.m. will not be returned until the following business day.  For prescription refill requests, have your pharmacy contact our office.

## 2015-06-14 LAB — TYPE AND SCREEN
ABO/RH(D): O NEG
Antibody Screen: NEGATIVE
UNIT DIVISION: 0
Unit division: 0

## 2015-07-03 ENCOUNTER — Other Ambulatory Visit: Payer: Self-pay | Admitting: Gastroenterology

## 2015-08-05 ENCOUNTER — Encounter (HOSPITAL_COMMUNITY): Payer: Medicare Other | Attending: Hematology and Oncology

## 2015-08-05 DIAGNOSIS — D509 Iron deficiency anemia, unspecified: Secondary | ICD-10-CM | POA: Diagnosis present

## 2015-08-05 DIAGNOSIS — D696 Thrombocytopenia, unspecified: Secondary | ICD-10-CM | POA: Insufficient documentation

## 2015-08-05 DIAGNOSIS — K7460 Unspecified cirrhosis of liver: Secondary | ICD-10-CM | POA: Insufficient documentation

## 2015-08-05 LAB — CBC WITH DIFFERENTIAL/PLATELET
Basophils Absolute: 0 10*3/uL (ref 0.0–0.1)
Basophils Relative: 1 %
EOS PCT: 2 %
Eosinophils Absolute: 0.1 10*3/uL (ref 0.0–0.7)
HEMATOCRIT: 30.5 % — AB (ref 39.0–52.0)
Hemoglobin: 9.5 g/dL — ABNORMAL LOW (ref 13.0–17.0)
LYMPHS ABS: 0.6 10*3/uL — AB (ref 0.7–4.0)
LYMPHS PCT: 15 %
MCH: 28.5 pg (ref 26.0–34.0)
MCHC: 31.1 g/dL (ref 30.0–36.0)
MCV: 91.6 fL (ref 78.0–100.0)
Monocytes Absolute: 0.3 10*3/uL (ref 0.1–1.0)
Monocytes Relative: 7 %
Neutro Abs: 2.9 10*3/uL (ref 1.7–7.7)
Neutrophils Relative %: 74 %
Platelets: 87 10*3/uL — ABNORMAL LOW (ref 150–400)
RBC: 3.33 MIL/uL — AB (ref 4.22–5.81)
RDW: 18.3 % — AB (ref 11.5–15.5)
WBC: 3.9 10*3/uL — ABNORMAL LOW (ref 4.0–10.5)

## 2015-08-05 LAB — FERRITIN: Ferritin: 8 ng/mL — ABNORMAL LOW (ref 24–336)

## 2015-08-12 ENCOUNTER — Encounter (HOSPITAL_COMMUNITY): Payer: Self-pay

## 2015-08-12 ENCOUNTER — Encounter (HOSPITAL_BASED_OUTPATIENT_CLINIC_OR_DEPARTMENT_OTHER): Payer: Medicare Other

## 2015-08-12 DIAGNOSIS — D509 Iron deficiency anemia, unspecified: Secondary | ICD-10-CM | POA: Diagnosis not present

## 2015-08-12 MED ORDER — SODIUM CHLORIDE 0.9 % IV SOLN
Freq: Once | INTRAVENOUS | Status: AC
  Administered 2015-08-12: 13:00:00 via INTRAVENOUS
  Filled 2015-08-12: qty 15

## 2015-08-12 MED ORDER — SODIUM CHLORIDE 0.9 % IV SOLN: Freq: Once | INTRAVENOUS | Status: DC

## 2015-08-12 NOTE — Progress Notes (Signed)
Patient tolerated IV iron today. Tolerated well.

## 2015-08-12 NOTE — Patient Instructions (Signed)
[place] at [facility] Discharge Instructions  RECOMMENDATIONS MADE BY THE CONSULTANT AND ANY TEST RESULTS WILL BE SENT TO YOUR REFERRING PHYSICIAN.  You received IV iron today.  Will see you at your next appointment  Thank you for choosing [place] at [facility] to provide your oncology and hematology care.  To afford each patient quality time with our provider, please arrive at least 15 minutes before your scheduled appointment time.   Beginning January 23rd 2017 lab work for the [facility] will be done in the  Main lab at [facility] on 1st floor. If you have a lab appointment with the [facility] please come in thru the  Main Entrance and check in at the main information desk  You need to re-schedule your appointment should you arrive 10 or more minutes late.  We strive to give you quality time with our providers, and arriving late affects you and other patients whose appointments are after yours.  Also, if you no show three or more times for appointments you may be dismissed from the clinic at the providers discretion.     Again, thank you for choosing [facility].  Our hope is that these requests will decrease the amount of time that you wait before being seen by our physicians.       _____________________________________________________________  Should you have questions after your visit to [facility], please contact our office at [phone] between the hours of 8:30 a.m. and 4:30 p.m.  Voicemails left after 4:30 p.m. will not be returned until the following business day.  For prescription refill requests, have your pharmacy contact our office.

## 2015-09-08 ENCOUNTER — Encounter (HOSPITAL_COMMUNITY): Payer: Self-pay | Admitting: Hematology & Oncology

## 2015-09-08 ENCOUNTER — Encounter (HOSPITAL_COMMUNITY): Payer: Medicare Other | Attending: Hematology and Oncology | Admitting: Hematology & Oncology

## 2015-09-08 ENCOUNTER — Encounter (HOSPITAL_COMMUNITY): Payer: Medicare Other

## 2015-09-08 VITALS — BP 117/64 | HR 80 | Temp 98.3°F | Resp 18 | Wt 155.0 lb

## 2015-09-08 DIAGNOSIS — K7460 Unspecified cirrhosis of liver: Secondary | ICD-10-CM | POA: Diagnosis not present

## 2015-09-08 DIAGNOSIS — K7682 Hepatic encephalopathy: Secondary | ICD-10-CM

## 2015-09-08 DIAGNOSIS — R5383 Other fatigue: Secondary | ICD-10-CM

## 2015-09-08 DIAGNOSIS — D509 Iron deficiency anemia, unspecified: Secondary | ICD-10-CM | POA: Diagnosis present

## 2015-09-08 DIAGNOSIS — D500 Iron deficiency anemia secondary to blood loss (chronic): Secondary | ICD-10-CM

## 2015-09-08 DIAGNOSIS — I8500 Esophageal varices without bleeding: Secondary | ICD-10-CM | POA: Diagnosis not present

## 2015-09-08 DIAGNOSIS — K922 Gastrointestinal hemorrhage, unspecified: Secondary | ICD-10-CM | POA: Diagnosis not present

## 2015-09-08 DIAGNOSIS — D696 Thrombocytopenia, unspecified: Secondary | ICD-10-CM | POA: Diagnosis not present

## 2015-09-08 DIAGNOSIS — K259 Gastric ulcer, unspecified as acute or chronic, without hemorrhage or perforation: Secondary | ICD-10-CM

## 2015-09-08 DIAGNOSIS — I8511 Secondary esophageal varices with bleeding: Secondary | ICD-10-CM

## 2015-09-08 DIAGNOSIS — K7581 Nonalcoholic steatohepatitis (NASH): Secondary | ICD-10-CM

## 2015-09-08 DIAGNOSIS — K7290 Hepatic failure, unspecified without coma: Secondary | ICD-10-CM

## 2015-09-08 LAB — CBC WITH DIFFERENTIAL/PLATELET
Basophils Absolute: 0 10*3/uL (ref 0.0–0.1)
Basophils Relative: 1 %
Eosinophils Absolute: 0.1 10*3/uL (ref 0.0–0.7)
Eosinophils Relative: 4 %
HEMATOCRIT: 30.5 % — AB (ref 39.0–52.0)
HEMOGLOBIN: 9.4 g/dL — AB (ref 13.0–17.0)
LYMPHS ABS: 0.5 10*3/uL — AB (ref 0.7–4.0)
Lymphocytes Relative: 16 %
MCH: 29.5 pg (ref 26.0–34.0)
MCHC: 30.8 g/dL (ref 30.0–36.0)
MCV: 95.6 fL (ref 78.0–100.0)
MONOS PCT: 8 %
Monocytes Absolute: 0.3 10*3/uL (ref 0.1–1.0)
NEUTROS ABS: 2.3 10*3/uL (ref 1.7–7.7)
NEUTROS PCT: 71 %
Platelets: 89 10*3/uL — ABNORMAL LOW (ref 150–400)
RBC: 3.19 MIL/uL — ABNORMAL LOW (ref 4.22–5.81)
RDW: 17.9 % — ABNORMAL HIGH (ref 11.5–15.5)
WBC: 3.3 10*3/uL — ABNORMAL LOW (ref 4.0–10.5)

## 2015-09-08 LAB — FERRITIN: Ferritin: 28 ng/mL (ref 24–336)

## 2015-09-08 NOTE — Patient Instructions (Addendum)
[place] at [facility] Discharge Instructions  RECOMMENDATIONS MADE BY THE CONSULTANT AND ANY TEST RESULTS WILL BE SENT TO YOUR REFERRING PHYSICIAN.   Exam and discussion by Dr Whitney Muse today Lab work today, we will call you with those results Labs every 6 weeks  Return to see the doctor in 3 months  Please call the clinic if you have any questions or concerns    Thank you for choosing [place] at [facility] to provide your oncology and hematology care.  To afford each patient quality time with our provider, please arrive at least 15 minutes before your scheduled appointment time.   Beginning January 23rd 2017 lab work for the [facility] will be done in the  Main lab at [facility] on 1st floor. If you have a lab appointment with the [facility] please come in thru the  Main Entrance and check in at the main information desk  You need to re-schedule your appointment should you arrive 10 or more minutes late.  We strive to give you quality time with our providers, and arriving late affects you and other patients whose appointments are after yours.  Also, if you no show three or more times for appointments you may be dismissed from the clinic at the providers discretion.     Again, thank you for choosing [facility].  Our hope is that these requests will decrease the amount of time that you wait before being seen by our physicians.       _____________________________________________________________  Should you have questions after your visit to [facility], please contact our office at [phone] between the hours of 8:30 a.m. and 4:30 p.m.  Voicemails left after 4:30 p.m. will not be returned until the following business day.  For prescription refill requests, have your pharmacy contact our office.         Resources For Cancer Patients and their Caregivers ? American Cancer Society: Can assist with  transportation, wigs, general needs, runs Look Good Feel Better.        475-374-3917 ? Cancer Care: Provides financial assistance, online support groups, medication/co-pay assistance.  1-800-813-HOPE 317-557-4519) ? [facility] Assists [place] cancer patients and their families through emotional , educational and financial support.  [phone] ? [place] DSS Where to apply for food stamps, Medicaid and utility assistance. 224-740-5304 ? RCATS: Transportation to medical appointments. (418) 036-2907 ? Social Security Administration: May apply for disability if have a Stage IV cancer. 450-129-9389 782-229-9461 ? [employer], Disability and Transit Services: Assists with nutrition, care and transit needs. 616-547-4116
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
## 2015-09-09 ENCOUNTER — Other Ambulatory Visit (HOSPITAL_COMMUNITY): Payer: Self-pay | Admitting: Oncology

## 2015-09-16 ENCOUNTER — Encounter (HOSPITAL_BASED_OUTPATIENT_CLINIC_OR_DEPARTMENT_OTHER): Payer: Medicare Other

## 2015-09-16 VITALS — BP 99/52 | HR 84 | Temp 98.0°F | Resp 18

## 2015-09-16 DIAGNOSIS — D500 Iron deficiency anemia secondary to blood loss (chronic): Secondary | ICD-10-CM

## 2015-09-16 DIAGNOSIS — D509 Iron deficiency anemia, unspecified: Secondary | ICD-10-CM

## 2015-09-16 MED ORDER — SODIUM CHLORIDE 0.9 % IV SOLN
Freq: Once | INTRAVENOUS | Status: AC
  Administered 2015-09-16: 12:00:00 via INTRAVENOUS
  Filled 2015-09-16: qty 15

## 2015-09-16 MED ORDER — SODIUM CHLORIDE 0.9 % IV SOLN
INTRAVENOUS | Status: DC
  Administered 2015-09-16: 12:00:00 via INTRAVENOUS

## 2015-09-16 NOTE — Progress Notes (Signed)
Patient tolerated infusion well.  VSS.   

## 2015-09-16 NOTE — Patient Instructions (Signed)
[facility] at [facility] Discharge Instructions  RECOMMENDATIONS MADE BY THE CONSULTANT AND ANY TEST RESULTS WILL BE SENT TO YOUR REFERRING PHYSICIAN.  IV iron today.    Thank you for choosing [facility] at [facility] to provide your oncology and hematology care.  To afford each patient quality time with our provider, please arrive at least 15 minutes before your scheduled appointment time.   Beginning January 23rd 2017 lab work for the Cancer Center will be done in the  Main lab at [facility] on 1st floor. If you have a lab appointment with the Cancer Center please come in thru the  Main Entrance and check in at the main information desk  You need to re-schedule your appointment should you arrive 10 or more minutes late.  We strive to give you quality time with our providers, and arriving late affects you and other patients whose appointments are after yours.  Also, if you no show three or more times for appointments you may be dismissed from the clinic at the providers discretion.     Again, thank you for choosing [facility].  Our hope is that these requests will decrease the amount of time that you wait before being seen by our physicians.       _____________________________________________________________  Should you have questions after your visit to [facility], please contact our office at [phone] between the hours of 8:30 a.m. and 4:30 p.m.  Voicemails left after 4:30 p.m. will not be returned until the following business day.  For prescription refill requests, have your pharmacy contact our office.         Resources For Cancer Patients and their Caregivers ? American Cancer Society: Can assist with transportation, wigs, general needs, runs Look Good Feel Better.        1-888-227-6333 ? Cancer Care: Provides financial assistance, online support groups, medication/co-pay assistance.  1-800-813-HOPE  (4673) ? [facility] Assists [place] cancer patients and their families through emotional , educational and financial support.  336-427-4357 ? [place] DSS Where to apply for food stamps, Medicaid and utility assistance. 336-342-1394 ? RCATS: Transportation to medical appointments. 336-347-2287 ? Social Security Administration: May apply for disability if have a Stage IV cancer. 336-342-7796 1-800-772-1213 ? [place] Aging, Disability and Transit Services: Assists with nutrition, care and transit needs. [phone]  

## 2015-09-22 ENCOUNTER — Other Ambulatory Visit (HOSPITAL_COMMUNITY): Payer: Self-pay | Admitting: Oncology

## 2015-09-30 ENCOUNTER — Other Ambulatory Visit (HOSPITAL_COMMUNITY): Payer: Medicare Other

## 2015-10-20 ENCOUNTER — Encounter (HOSPITAL_COMMUNITY): Payer: Medicare Other | Attending: Hematology and Oncology

## 2015-10-20 DIAGNOSIS — K7460 Unspecified cirrhosis of liver: Secondary | ICD-10-CM | POA: Diagnosis not present

## 2015-10-20 DIAGNOSIS — D509 Iron deficiency anemia, unspecified: Secondary | ICD-10-CM | POA: Diagnosis not present

## 2015-10-20 DIAGNOSIS — D696 Thrombocytopenia, unspecified: Secondary | ICD-10-CM | POA: Insufficient documentation

## 2015-10-20 LAB — CBC WITH DIFFERENTIAL/PLATELET
BASOS ABS: 0 10*3/uL (ref 0.0–0.1)
BASOS PCT: 1 %
EOS ABS: 0.1 10*3/uL (ref 0.0–0.7)
EOS PCT: 2 %
HEMATOCRIT: 30.9 % — AB (ref 39.0–52.0)
Hemoglobin: 10 g/dL — ABNORMAL LOW (ref 13.0–17.0)
Lymphocytes Relative: 13 %
Lymphs Abs: 0.5 10*3/uL — ABNORMAL LOW (ref 0.7–4.0)
MCH: 30.2 pg (ref 26.0–34.0)
MCHC: 32.4 g/dL (ref 30.0–36.0)
MCV: 93.4 fL (ref 78.0–100.0)
MONO ABS: 0.4 10*3/uL (ref 0.1–1.0)
MONOS PCT: 9 %
Neutro Abs: 3.1 10*3/uL (ref 1.7–7.7)
Neutrophils Relative %: 75 %
Platelets: 107 10*3/uL — ABNORMAL LOW (ref 150–400)
RBC: 3.31 MIL/uL — ABNORMAL LOW (ref 4.22–5.81)
RDW: 16.9 % — ABNORMAL HIGH (ref 11.5–15.5)
WBC: 4.1 10*3/uL (ref 4.0–10.5)

## 2015-10-20 LAB — FERRITIN: Ferritin: 23 ng/mL — ABNORMAL LOW (ref 24–336)

## 2015-10-28 ENCOUNTER — Encounter (HOSPITAL_COMMUNITY): Payer: Medicare Other | Attending: Hematology and Oncology

## 2015-10-28 VITALS — BP 102/54 | HR 81 | Temp 98.2°F | Resp 16

## 2015-10-28 DIAGNOSIS — K7460 Unspecified cirrhosis of liver: Secondary | ICD-10-CM | POA: Insufficient documentation

## 2015-10-28 DIAGNOSIS — D696 Thrombocytopenia, unspecified: Secondary | ICD-10-CM | POA: Insufficient documentation

## 2015-10-28 DIAGNOSIS — D500 Iron deficiency anemia secondary to blood loss (chronic): Secondary | ICD-10-CM | POA: Diagnosis not present

## 2015-10-28 DIAGNOSIS — D509 Iron deficiency anemia, unspecified: Secondary | ICD-10-CM | POA: Insufficient documentation

## 2015-10-28 MED ORDER — SODIUM CHLORIDE 0.9 % IV SOLN
INTRAVENOUS | Status: DC
  Administered 2015-10-28: 14:00:00 via INTRAVENOUS

## 2015-10-28 MED ORDER — SODIUM CHLORIDE 0.9 % IV SOLN
750.0000 mg | Freq: Once | INTRAVENOUS | Status: AC
  Administered 2015-10-28: 750 mg via INTRAVENOUS
  Filled 2015-10-28: qty 15

## 2015-10-28 NOTE — Progress Notes (Signed)
Patient tolerated infusion well.  VSS.   

## 2015-10-28 NOTE — Patient Instructions (Signed)
[facility] at [facility] Discharge Instructions  RECOMMENDATIONS MADE BY THE CONSULTANT AND ANY TEST RESULTS WILL BE SENT TO YOUR REFERRING PHYSICIAN.  IV iron today.    Thank you for choosing [facility] at [facility] to provide your oncology and hematology care.  To afford each patient quality time with our provider, please arrive at least 15 minutes before your scheduled appointment time.   Beginning January 23rd 2017 lab work for the Cancer Center will be done in the  Main lab at [facility] on 1st floor. If you have a lab appointment with the Cancer Center please come in thru the  Main Entrance and check in at the main information desk  You need to re-schedule your appointment should you arrive 10 or more minutes late.  We strive to give you quality time with our providers, and arriving late affects you and other patients whose appointments are after yours.  Also, if you no show three or more times for appointments you may be dismissed from the clinic at the providers discretion.     Again, thank you for choosing [facility].  Our hope is that these requests will decrease the amount of time that you wait before being seen by our physicians.       _____________________________________________________________  Should you have questions after your visit to [facility], please contact our office at [phone] between the hours of 8:30 a.m. and 4:30 p.m.  Voicemails left after 4:30 p.m. will not be returned until the following business day.  For prescription refill requests, have your pharmacy contact our office.         Resources For Cancer Patients and their Caregivers ? American Cancer Society: Can assist with transportation, wigs, general needs, runs Look Good Feel Better.        1-888-227-6333 ? Cancer Care: Provides financial assistance, online support groups, medication/co-pay assistance.  1-800-813-HOPE  (4673) ? [facility] Assists [place] cancer patients and their families through emotional , educational and financial support.  336-427-4357 ? [place] DSS Where to apply for food stamps, Medicaid and utility assistance. 336-342-1394 ? RCATS: Transportation to medical appointments. 336-347-2287 ? Social Security Administration: May apply for disability if have a Stage IV cancer. 336-342-7796 1-800-772-1213 ? [place] Aging, Disability and Transit Services: Assists with nutrition, care and transit needs. 336-349-2343  

## 2015-11-09 ENCOUNTER — Emergency Department (HOSPITAL_COMMUNITY): Payer: Medicare Other

## 2015-11-09 ENCOUNTER — Emergency Department (HOSPITAL_COMMUNITY)
Admission: EM | Admit: 2015-11-09 | Discharge: 2015-11-09 | Disposition: A | Discharge status: Home / Self Care | Payer: Medicare Other | Attending: Emergency Medicine | Admitting: Emergency Medicine

## 2015-11-09 ENCOUNTER — Encounter (HOSPITAL_COMMUNITY): Payer: Self-pay | Admitting: Emergency Medicine

## 2015-11-09 DIAGNOSIS — F329 Major depressive disorder, single episode, unspecified: Secondary | ICD-10-CM | POA: Diagnosis not present

## 2015-11-09 DIAGNOSIS — Y93F2 Activity, caregiving, lifting: Secondary | ICD-10-CM | POA: Diagnosis not present

## 2015-11-09 DIAGNOSIS — Y929 Unspecified place or not applicable: Secondary | ICD-10-CM | POA: Diagnosis not present

## 2015-11-09 DIAGNOSIS — I10 Essential (primary) hypertension: Secondary | ICD-10-CM | POA: Diagnosis not present

## 2015-11-09 DIAGNOSIS — S39012A Strain of muscle, fascia and tendon of lower back, initial encounter: Secondary | ICD-10-CM | POA: Insufficient documentation

## 2015-11-09 DIAGNOSIS — S3992XA Unspecified injury of lower back, initial encounter: Secondary | ICD-10-CM | POA: Diagnosis present

## 2015-11-09 DIAGNOSIS — X509XXA Other and unspecified overexertion or strenuous movements or postures, initial encounter: Secondary | ICD-10-CM | POA: Insufficient documentation

## 2015-11-09 DIAGNOSIS — Z87891 Personal history of nicotine dependence: Secondary | ICD-10-CM | POA: Diagnosis not present

## 2015-11-09 DIAGNOSIS — E119 Type 2 diabetes mellitus without complications: Secondary | ICD-10-CM | POA: Insufficient documentation

## 2015-11-09 DIAGNOSIS — Y999 Unspecified external cause status: Secondary | ICD-10-CM | POA: Insufficient documentation

## 2015-11-09 DIAGNOSIS — Z8673 Personal history of transient ischemic attack (TIA), and cerebral infarction without residual deficits: Secondary | ICD-10-CM | POA: Diagnosis not present

## 2015-11-09 HISTORY — DX: Other intervertebral disc degeneration, lumbar region without mention of lumbar back pain or lower extremity pain: M51.369

## 2015-11-09 HISTORY — DX: Other intervertebral disc degeneration, lumbar region: M51.36

## 2015-11-09 MED ORDER — DIAZEPAM 5 MG PO TABS
10.0000 mg | ORAL_TABLET | Freq: Once | ORAL | Status: AC
  Administered 2015-11-09: 10 mg via ORAL
  Filled 2015-11-09: qty 2

## 2015-11-09 MED ORDER — HYDROCODONE-ACETAMINOPHEN 5-325 MG PO TABS
2.0000 | ORAL_TABLET | Freq: Once | ORAL | Status: AC
  Administered 2015-11-09: 2 via ORAL
  Filled 2015-11-09: qty 2
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
## 2015-11-09 NOTE — Discharge Instructions (Signed)
Your vital signs within normal limits. Your examination suggest muscle strain involving the lumbar area. Please use a heating pad on, and rest your back is much as possible. Please use Flexeril 3 times daily. If this medication is not effective, accompanied by rest and heating pad, please see Dr. Karie Kirks, or member of his team for additional evaluation. Please return to the emergency department if any emergent changes, problems, or concerns. Lumbosacral Strain Lumbosacral strain is a strain of any of the parts that make up your lumbosacral vertebrae. Your lumbosacral vertebrae are the bones that make up the lower third of your backbone. Your lumbosacral vertebrae are held together by muscles and tough, fibrous tissue (ligaments).  CAUSES  A sudden blow to your back can cause lumbosacral strain. Also, anything that causes an excessive stretch of the muscles in the low back can cause this strain. This is typically seen when people exert themselves strenuously, fall, lift heavy objects, bend, or crouch repeatedly. RISK FACTORS  Physically demanding work.  Participation in pushing or pulling sports or sports that require a sudden twist of the back (tennis, golf, baseball).  Weight lifting.  Excessive lower back curvature.  Forward-tilted pelvis.  Weak back or abdominal muscles or both.  Tight hamstrings. SIGNS AND SYMPTOMS  Lumbosacral strain may cause pain in the area of your injury or pain that moves (radiates) down your leg.  DIAGNOSIS Your health care provider can often diagnose lumbosacral strain through a physical exam. In some cases, you may need tests such as X-ray exams.  TREATMENT  Treatment for your lower back injury depends on many factors that your clinician will have to evaluate. However, most treatment will include the use of anti-inflammatory medicines. HOME CARE INSTRUCTIONS   Avoid hard physical activities (tennis, racquetball, waterskiing) if you are not in proper physical  condition for it. This may aggravate or create problems.  If you have a back problem, avoid sports requiring sudden body movements. Swimming and walking are generally safer activities.  Maintain good posture.  Maintain a healthy weight.  For acute conditions, you may put ice on the injured area.  Put ice in a plastic bag.  Place a towel between your skin and the bag.  Leave the ice on for 20 minutes, 2-3 times a day.  When the low back starts healing, stretching and strengthening exercises may be recommended. SEEK MEDICAL CARE IF:  Your back pain is getting worse.  You experience severe back pain not relieved with medicines. SEEK IMMEDIATE MEDICAL CARE IF:   You have numbness, tingling, weakness, or problems with the use of your arms or legs.  There is a change in bowel or bladder control.  You have increasing pain in any area of the body, including your belly (abdomen).  You notice shortness of breath, dizziness, or feel faint.  You feel sick to your stomach (nauseous), are throwing up (vomiting), or become sweaty.  You notice discoloration of your toes or legs, or your feet get very cold. MAKE SURE YOU:   Understand these instructions.  Will watch your condition.  Will get help right away if you are not doing well or get worse.   This information is not intended to replace advice given to you by your health care provider. Make sure you discuss any questions you have with your health care provider.   Document Released: 03/24/2005 Document Revised: 07/05/2014 Document Reviewed: 01/31/2013 Elsevier Interactive Patient Education [employer].

## 2015-11-09 NOTE — ED Notes (Signed)
Patient c/o lower back pain. Per wife patient c/o back pain x2-3 weeks but after resting would be relieved. Patient packing boxes to move in last week. Per wife his back pain was progressively getting worse after all the bending and lifting and swelling to lower back noted yesterday. Wife states "looked like a baseball on his back. Swelling has significantly decreased since then. Patient noted to have difficulty standing straight and walking." Denies any complications with urination or BMs.

## 2015-11-29 ENCOUNTER — Other Ambulatory Visit: Payer: Self-pay | Admitting: Nurse Practitioner

## 2015-11-29 ENCOUNTER — Other Ambulatory Visit (HOSPITAL_COMMUNITY): Payer: Self-pay | Admitting: Oncology

## 2015-12-01 ENCOUNTER — Encounter (HOSPITAL_COMMUNITY): Payer: Self-pay | Admitting: Oncology

## 2015-12-01 ENCOUNTER — Encounter (HOSPITAL_COMMUNITY): Payer: Medicare Other | Attending: Oncology | Admitting: Oncology

## 2015-12-01 ENCOUNTER — Encounter (HOSPITAL_COMMUNITY): Payer: Medicare Other

## 2015-12-01 VITALS — BP 102/57 | HR 86 | Temp 98.1°F | Resp 20 | Wt 152.5 lb

## 2015-12-01 DIAGNOSIS — E118 Type 2 diabetes mellitus with unspecified complications: Secondary | ICD-10-CM | POA: Insufficient documentation

## 2015-12-01 DIAGNOSIS — Z9049 Acquired absence of other specified parts of digestive tract: Secondary | ICD-10-CM | POA: Diagnosis not present

## 2015-12-01 DIAGNOSIS — Z8673 Personal history of transient ischemic attack (TIA), and cerebral infarction without residual deficits: Secondary | ICD-10-CM | POA: Diagnosis not present

## 2015-12-01 DIAGNOSIS — D500 Iron deficiency anemia secondary to blood loss (chronic): Secondary | ICD-10-CM

## 2015-12-01 DIAGNOSIS — D509 Iron deficiency anemia, unspecified: Secondary | ICD-10-CM | POA: Diagnosis not present

## 2015-12-01 DIAGNOSIS — I864 Gastric varices: Secondary | ICD-10-CM

## 2015-12-01 DIAGNOSIS — Z9889 Other specified postprocedural states: Secondary | ICD-10-CM | POA: Insufficient documentation

## 2015-12-01 DIAGNOSIS — D696 Thrombocytopenia, unspecified: Secondary | ICD-10-CM | POA: Insufficient documentation

## 2015-12-01 DIAGNOSIS — Z8042 Family history of malignant neoplasm of prostate: Secondary | ICD-10-CM | POA: Diagnosis not present

## 2015-12-01 DIAGNOSIS — E538 Deficiency of other specified B group vitamins: Secondary | ICD-10-CM | POA: Diagnosis not present

## 2015-12-01 DIAGNOSIS — R5383 Other fatigue: Secondary | ICD-10-CM

## 2015-12-01 DIAGNOSIS — D6959 Other secondary thrombocytopenia: Secondary | ICD-10-CM

## 2015-12-01 DIAGNOSIS — D7589 Other specified diseases of blood and blood-forming organs: Secondary | ICD-10-CM | POA: Diagnosis not present

## 2015-12-01 DIAGNOSIS — Z801 Family history of malignant neoplasm of trachea, bronchus and lung: Secondary | ICD-10-CM | POA: Insufficient documentation

## 2015-12-01 DIAGNOSIS — Z87891 Personal history of nicotine dependence: Secondary | ICD-10-CM | POA: Diagnosis not present

## 2015-12-01 DIAGNOSIS — K7460 Unspecified cirrhosis of liver: Secondary | ICD-10-CM | POA: Diagnosis not present

## 2015-12-01 DIAGNOSIS — Z808 Family history of malignant neoplasm of other organs or systems: Secondary | ICD-10-CM | POA: Insufficient documentation

## 2015-12-01 DIAGNOSIS — K7581 Nonalcoholic steatohepatitis (NASH): Secondary | ICD-10-CM | POA: Insufficient documentation

## 2015-12-01 DIAGNOSIS — I10 Essential (primary) hypertension: Secondary | ICD-10-CM | POA: Insufficient documentation

## 2015-12-01 DIAGNOSIS — R161 Splenomegaly, not elsewhere classified: Secondary | ICD-10-CM

## 2015-12-01 LAB — CBC WITH DIFFERENTIAL/PLATELET
BASOS ABS: 0 10*3/uL (ref 0.0–0.1)
Basophils Relative: 1 %
EOS ABS: 0.1 10*3/uL (ref 0.0–0.7)
EOS PCT: 3 %
HCT: 28.4 % — ABNORMAL LOW (ref 39.0–52.0)
Hemoglobin: 8.6 g/dL — ABNORMAL LOW (ref 13.0–17.0)
Lymphocytes Relative: 13 %
Lymphs Abs: 0.4 10*3/uL — ABNORMAL LOW (ref 0.7–4.0)
MCH: 28.9 pg (ref 26.0–34.0)
MCHC: 30.3 g/dL (ref 30.0–36.0)
MCV: 95.3 fL (ref 78.0–100.0)
MONO ABS: 0.3 10*3/uL (ref 0.1–1.0)
Monocytes Relative: 8 %
Neutro Abs: 2.6 10*3/uL (ref 1.7–7.7)
Neutrophils Relative %: 76 %
PLATELETS: 101 10*3/uL — AB (ref 150–400)
RBC: 2.98 MIL/uL — AB (ref 4.22–5.81)
RDW: 17.7 % — AB (ref 11.5–15.5)
WBC: 3.5 10*3/uL — AB (ref 4.0–10.5)

## 2015-12-01 LAB — FERRITIN: FERRITIN: 17 ng/mL — AB (ref 24–336)

## 2015-12-01 NOTE — Assessment & Plan Note (Addendum)
Iron deficiency secondary to varices from cirrhosis of liver secondary to NASH (followed by GI) and Folate deficiency; requiring IV iron replacement when indicated and continuous PO folate replacement.  Additionally, he has thrombocytopenia, secondary to severe cirrhosis of liver with splenomegaly.  EGD on 12/17/2014 demonstrated persistent grade 1 esophageal varices (4 columns), diffusely congested gastric mucosa, friable with snake skinning or fish scale appearance consistent with portal gastropathy without gastric varices.  Negative colonoscopy on 11/11/2014.  Labs today: CBC, ferritin.  Given his HGB today, we will increase the frequency of lab checks.  He notes increased fatigue and feels as though his ferritin is low.  Historically, that has been the case and therefore, we will be pre-emptive and get him set-up for an iron infusion on Wednesday.  He has an appointment at [facility] on Thursday.  Based upon his ferritin that I should get back today or tomorrow, I will calculate his iron needs and administer IV iron as indicated.  Labs in 3 weeks: CBC diff, renal function panel, ferritin, B12, and folate.  He is to continue with by mouth folate replacement.  The patient and wife are moving back to [place] on July 8th.  As a result, we will make sure we ascertain labs in 3 weeks prior to their departure and infuse iron if indicated.  Additionally, we will refer the patient to Hem/Onc at [facility] at [address], [place] [address]  No return follow-up.  Will discharge the patient from the clinic with referral to [facility].  We will send records as well.
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
## 2015-12-01 NOTE — Patient Instructions (Signed)
[place] at [facility] Discharge Instructions  RECOMMENDATIONS MADE BY THE CONSULTANT AND ANY TEST RESULTS WILL BE SENT TO YOUR REFERRING PHYSICIAN.  We will get you set up for iron infusion this week. Repeat labs in 3 weeks. We will refer you to a new hematologist in [place] at your request. Good luck with your move.   Thank you for choosing [place] at [facility] to provide your oncology and hematology care.  To afford each patient quality time with our provider, please arrive at least 15 minutes before your scheduled appointment time.   Beginning January 23rd 2017 lab work for the [facility] will be done in the  Main lab at [facility] on 1st floor. If you have a lab appointment with the [facility] please come in thru the  Main Entrance and check in at the main information desk  You need to re-schedule your appointment should you arrive 10 or more minutes late.  We strive to give you quality time with our providers, and arriving late affects you and other patients whose appointments are after yours.  Also, if you no show three or more times for appointments you may be dismissed from the clinic at the providers discretion.     Again, thank you for choosing [facility].  Our hope is that these requests will decrease the amount of time that you wait before being seen by our physicians.       _____________________________________________________________  Should you have questions after your visit to [facility], please contact our office at [phone] between the hours of 8:30 a.m. and 4:30 p.m.  Voicemails left after 4:30 p.m. will not be returned until the following business day.  For prescription refill requests, have your pharmacy contact our office.         Resources For Cancer Patients and their Caregivers ? American Cancer Society: Can assist with transportation, wigs, general needs,  runs Look Good Feel Better.        510-798-7216 ? Cancer Care: Provides financial assistance, online support groups, medication/co-pay assistance.  1-800-813-HOPE 650 820 6815) ? [place] Assists [place] cancer patients and their families through emotional , educational and financial support.  6396174233 ? [place] DSS Where to apply for food stamps, Medicaid and utility assistance. 9402819427 ? RCATS: Transportation to medical appointments. 4422469076 ? Social Security Administration: May apply for disability if have a Stage IV cancer. 218-510-3507 [phone])861-6437 ? [employer], Disability and Transit Services: Assists with nutrition, care and transit needs. [place] Support Programs: @10RELATIVEDAYS @ > Cancer Support Group  2nd Tuesday of the month 1pm-2pm, Journey Room  > Creative Journey  3rd Tuesday of the month 1130am-1pm, Journey Room  > Look Good Feel Better  1st Wednesday of the month 10am-12 noon, Journey Room (Call [place] to register [phone])

## 2015-12-03 ENCOUNTER — Encounter (HOSPITAL_COMMUNITY): Payer: Self-pay

## 2015-12-03 ENCOUNTER — Encounter (HOSPITAL_BASED_OUTPATIENT_CLINIC_OR_DEPARTMENT_OTHER): Payer: Medicare Other

## 2015-12-03 VITALS — BP 106/59 | HR 85 | Temp 98.5°F | Resp 16

## 2015-12-03 DIAGNOSIS — D509 Iron deficiency anemia, unspecified: Secondary | ICD-10-CM | POA: Diagnosis not present

## 2015-12-03 MED ORDER — SODIUM CHLORIDE 0.9 % IV SOLN
750.0000 mg | Freq: Once | INTRAVENOUS | Status: AC
  Administered 2015-12-03: 750 mg via INTRAVENOUS
  Filled 2015-12-03: qty 15

## 2015-12-03 MED ORDER — SODIUM CHLORIDE 0.9 % IV SOLN
INTRAVENOUS | Status: DC
  Administered 2015-12-03: 14:00:00 via INTRAVENOUS

## 2015-12-03 NOTE — Progress Notes (Signed)
Brad Wall Tolerated iron infusion  Discharged ambulatory  After 30 minute wait period

## 2015-12-03 NOTE — Patient Instructions (Signed)
[place] at [facility] Discharge Instructions  RECOMMENDATIONS MADE BY THE CONSULTANT AND ANY TEST RESULTS WILL BE SENT TO YOUR REFERRING PHYSICIAN.   Iv iron infusion today Follow up as scheduled  please call the clinic if you have any questions or concerns   Thank you for choosing [place] at [facility] to provide your oncology and hematology care.  To afford each patient quality time with our provider, please arrive at least 15 minutes before your scheduled appointment time.   Beginning January 23rd 2017 lab work for the [facility] will be done in the  Main lab at [facility] on 1st floor. If you have a lab appointment with the [facility] please come in thru the  Main Entrance and check in at the main information desk  You need to re-schedule your appointment should you arrive 10 or more minutes late.  We strive to give you quality time with our providers, and arriving late affects you and other patients whose appointments are after yours.  Also, if you no show three or more times for appointments you may be dismissed from the clinic at the providers discretion.     Again, thank you for choosing [facility].  Our hope is that these requests will decrease the amount of time that you wait before being seen by our physicians.       _____________________________________________________________  Should you have questions after your visit to [facility], please contact our office at [phone] between the hours of 8:30 a.m. and 4:30 p.m.  Voicemails left after 4:30 p.m. will not be returned until the following business day.  For prescription refill requests, have your pharmacy contact our office.         Resources For Cancer Patients and their Caregivers ? American Cancer Society: Can assist with transportation, wigs, general needs, runs Look Good Feel Better.        925-260-8010 ? Cancer  Care: Provides financial assistance, online support groups, medication/co-pay assistance.  1-800-813-HOPE 989 345 7642) ? [place] Assists [place] cancer patients and their families through emotional , educational and financial support.  4130986886 ? [place] DSS Where to apply for food stamps, Medicaid and utility assistance. (567)652-6791 ? RCATS: Transportation to medical appointments. 782-107-6626 ? Social Security Administration: May apply for disability if have a Stage IV cancer. (813)387-7278 713-694-1303 ? [employer], Disability and Transit Services: Assists with nutrition, care and transit needs. [place] Support Programs: @10RELATIVEDAYS @ > Cancer Support Group  2nd Tuesday of the month 1pm-2pm, Journey Room  > Creative Journey  3rd Tuesday of the month 1130am-1pm, Journey Room  > Look Good Feel Better  1st Wednesday of the month 10am-12 noon, Journey Room (Call [place] to register [phone])

## 2015-12-22 ENCOUNTER — Encounter (HOSPITAL_COMMUNITY): Payer: Medicare Other

## 2015-12-22 ENCOUNTER — Other Ambulatory Visit (HOSPITAL_COMMUNITY): Payer: Self-pay | Admitting: Oncology

## 2015-12-22 DIAGNOSIS — D509 Iron deficiency anemia, unspecified: Secondary | ICD-10-CM | POA: Diagnosis not present

## 2015-12-22 LAB — CBC WITH DIFFERENTIAL/PLATELET
BASOS ABS: 0 10*3/uL (ref 0.0–0.1)
Basophils Relative: 1 %
EOS PCT: 3 %
Eosinophils Absolute: 0.1 10*3/uL (ref 0.0–0.7)
HCT: 32.5 % — ABNORMAL LOW (ref 39.0–52.0)
Hemoglobin: 10 g/dL — ABNORMAL LOW (ref 13.0–17.0)
LYMPHS PCT: 16 %
Lymphs Abs: 0.5 10*3/uL — ABNORMAL LOW (ref 0.7–4.0)
MCH: 30.7 pg (ref 26.0–34.0)
MCHC: 30.8 g/dL (ref 30.0–36.0)
MCV: 99.7 fL (ref 78.0–100.0)
Monocytes Absolute: 0.2 10*3/uL (ref 0.1–1.0)
Monocytes Relative: 7 %
NEUTROS PCT: 73 %
Neutro Abs: 2.4 10*3/uL (ref 1.7–7.7)
PLATELETS: 105 10*3/uL — AB (ref 150–400)
RBC: 3.26 MIL/uL — AB (ref 4.22–5.81)
RDW: 19.6 % — ABNORMAL HIGH (ref 11.5–15.5)
WBC: 3.2 10*3/uL — AB (ref 4.0–10.5)

## 2015-12-22 LAB — FERRITIN: FERRITIN: 80 ng/mL (ref 24–336)

## 2015-12-25 ENCOUNTER — Encounter (HOSPITAL_BASED_OUTPATIENT_CLINIC_OR_DEPARTMENT_OTHER): Payer: Medicare Other

## 2015-12-25 VITALS — BP 105/62 | HR 85 | Temp 98.2°F | Resp 18

## 2015-12-25 DIAGNOSIS — D509 Iron deficiency anemia, unspecified: Secondary | ICD-10-CM

## 2015-12-25 MED ORDER — SODIUM CHLORIDE 0.9 % IV SOLN
Freq: Once | INTRAVENOUS | Status: AC
  Administered 2015-12-25: 15:00:00 via INTRAVENOUS

## 2015-12-25 MED ORDER — SODIUM CHLORIDE 0.9 % IV SOLN
510.0000 mg | Freq: Once | INTRAVENOUS | Status: AC
  Administered 2015-12-25: 510 mg via INTRAVENOUS
  Filled 2015-12-25: qty 17

## 2015-12-25 NOTE — Patient Instructions (Signed)
[place] at [facility] Discharge Instructions  RECOMMENDATIONS MADE BY THE CONSULTANT AND ANY TEST RESULTS WILL BE SENT TO YOUR REFERRING PHYSICIAN.  IV feraheme given to you today. Follow up as scheduled. Call for any concerns or questions  Thank you for choosing [place] at [facility] to provide your oncology and hematology care.  To afford each patient quality time with our provider, please arrive at least 15 minutes before your scheduled appointment time.   Beginning January 23rd 2017 lab work for the [facility] will be done in the  Main lab at [facility] on 1st floor. If you have a lab appointment with the [facility] please come in thru the  Main Entrance and check in at the main information desk  You need to re-schedule your appointment should you arrive 10 or more minutes late.  We strive to give you quality time with our providers, and arriving late affects you and other patients whose appointments are after yours.  Also, if you no show three or more times for appointments you may be dismissed from the clinic at the providers discretion.     Again, thank you for choosing [facility].  Our hope is that these requests will decrease the amount of time that you wait before being seen by our physicians.       _____________________________________________________________  Should you have questions after your visit to [facility], please contact our office at [phone] between the hours of 8:30 a.m. and 4:30 p.m.  Voicemails left after 4:30 p.m. will not be returned until the following business day.  For prescription refill requests, have your pharmacy contact our office.         Resources For Cancer Patients and their Caregivers ? American Cancer Society: Can assist with transportation, wigs, general needs, runs Look Good Feel Better.        (406) [phone] ? Cancer Care: Provides financial  assistance, online support groups, medication/co-pay assistance.  1-800-813-HOPE 307-259-5415) ? [place] Assists [place] cancer patients and their families through emotional , educational and financial support.  505-411-7068 ? [place] DSS Where to apply for food stamps, Medicaid and utility assistance. 8636195913 ? RCATS: Transportation to medical appointments. 450-406-6092 ? Social Security Administration: May apply for disability if have a Stage IV cancer. 6400145175 807-353-4901 ? [employer], Disability and Transit Services: Assists with nutrition, care and transit needs. [place] Support Programs: @10RELATIVEDAYS @ > Cancer Support Group  2nd Tuesday of the month 1pm-2pm, Journey Room  > Creative Journey  3rd Tuesday of the month 1130am-1pm, Journey Room  > Look Good Feel Better  1st Wednesday of the month 10am-12 noon, Journey Room (Call [place] to register [phone])

## 2015-12-25 NOTE — Progress Notes (Signed)
Patient received IV Feraheme today per orders. Patient tolerated well, without incident.

## 2016-01-01 ENCOUNTER — Telehealth: Payer: Self-pay | Admitting: Internal Medicine

## 2016-01-01 MED ORDER — LACTULOSE 10 GM/15ML PO SOLN: ORAL | Status: AC

## 2016-01-01 NOTE — Telephone Encounter (Signed)
Try not to miss any doses if possible. I am sorry they are moving, but I understand. They will be missed. Best wishes to them. Tell them Vicente Males said hello and safe travels.

## 2016-01-01 NOTE — Telephone Encounter (Signed)
I called and spoke with the patient's wife. She said they are moving back home. They moved here about 6 years ago due to pt's health and so he would be near [place]. He is doing very well now and she THANKS DR. [name] for contributing to his improvement. She appreciates all that he has done for him. They do need a refill on the Lactulose TODAY since they will start moving tomorrow. Pt has been having good BM's.  He is taking 30 ml in the AM and 10 ml at night, this has worked well for him. He wants to know if he can miss his AM dose 2 mornings in a row due to the moving process. Also, he would like to know if he will have to take this for the remainder of his life. Please advise!

## 2016-01-01 NOTE — Telephone Encounter (Signed)
He should be okay if he skips two AM doses in the setting of travel. Yes he will need to continue lifelong.

## 2016-01-01 NOTE — Telephone Encounter (Signed)
Patient wife called stating that he needs lactalose and they are moving to [place].  She has a few questions...can he miss 2 doses while they are moving and will he need this for the rest of his life?  Please advise [phone]

## 2016-01-01 NOTE — Addendum Note (Signed)
Addended by: Orvil Feil on: 01/01/2016 12:43 PM   Modules accepted: Orders

## 2016-01-01 NOTE — Telephone Encounter (Signed)
LMOM to call.

## 2016-01-02 NOTE — Telephone Encounter (Signed)
PT's wife is aware. She said to say "THANKS" to all of the staff and especially DR. ROURK, whom she said had a very large part in pt being able to get back home. They will forever be grateful to Dr. [name] and all of the staff at [facility].

## 2016-01-02 NOTE — Telephone Encounter (Signed)
Communicated noted. I wish him the best of luck.

## 2016-01-05 NOTE — Telephone Encounter (Signed)
Noted  

## 2016-03-16 ENCOUNTER — Other Ambulatory Visit (HOSPITAL_COMMUNITY): Payer: Self-pay

## 2016-03-16 DIAGNOSIS — E538 Deficiency of other specified B group vitamins: Secondary | ICD-10-CM

## 2016-03-16 DIAGNOSIS — D509 Iron deficiency anemia, unspecified: Secondary | ICD-10-CM

## 2016-03-16 MED ORDER — FOLIC ACID 1 MG PO TABS: 1.0000 mg | ORAL_TABLET | Freq: Every day | ORAL | 5 refills | Status: AC

## 2016-03-18 ENCOUNTER — Ambulatory Visit
Admit: 2016-03-18 | Discharge: 2016-03-18 | Payer: PRIVATE HEALTH INSURANCE | Attending: Family Medicine | Primary: Legal Medicine

## 2016-03-18 DIAGNOSIS — D649 Anemia, unspecified: Secondary | ICD-10-CM

## 2016-03-18 MED ORDER — LORAZEPAM 1 MG TAB
1 mg | ORAL_TABLET | Freq: Four times a day (QID) | ORAL | 0 refills | Status: DC | PRN
Start: 2016-03-18 — End: 2016-06-30

## 2016-03-18 NOTE — Progress Notes (Signed)
William Blanchard is a 70 y.o. male here to establish care     1. Have you been to the ER, urgent care clinic or hospitalized since your last visit? NO.     2. Have you seen or consulted any other health care providers outside of the [facility] since your last visit (Include any pap smears or colon screening)? NO      Do you have an Advanced Directive? NO    Would you like information on Advanced Directives? NO      Learning Assessment 03/18/2016   PRIMARY LEARNER Patient   HIGHEST LEVEL OF EDUCATION - PRIMARY LEARNER  SOME COLLEGE   BARRIERS PRIMARY LEARNER NONE   CO-LEARNER CAREGIVER No   PRIMARY LANGUAGE ENGLISH   LEARNER PREFERENCE PRIMARY PICTURES     DEMONSTRATION   ANSWERED BY patient   RELATIONSHIP SELF

## 2016-03-18 NOTE — Progress Notes (Signed)
HISTORY OF PRESENT ILLNESS  William Blanchard is a 70 y.o. male.  Establish Care   The history is provided by the patient. Associated symptoms include abdominal pain ("abdominal pain with cirrhosis") and headaches (migraine headaches). Pertinent negatives include no chest pain and no shortness of breath.     Past Medical History:   Diagnosis Date   ??? Anxiety    ??? Cirrhosis of liver ([place])    ??? Diabetes ([place])    ??? GERD (gastroesophageal reflux disease)    ??? Hyperlipemia    ??? Hypertension    ??? Migraine    ??? Stroke [facility])     had 3 strokes    Chronic anemia-reports inability to absorb iron orally, has been receiving infusions every two months    Past Surgical History:   Procedure Laterality Date   ??? HX CHOLECYSTECTOMY     ??? HX ORTHOPAEDIC      R hand sx       Family History   Problem Relation Age of Onset   ??? Cancer Mother      colon cancer    ??? Cancer Father      prostate, lung cancer    ??? Heart Disease Father    ??? Diabetes Sister    ??? Heart Disease Sister    ??? Diabetes Brother    ??? Hypertension Neg Hx        History   Smoking Status   ??? Former Smoker   Smokeless Tobacco   ??? Former Systems developer     Comment: quit years ago     History   Alcohol Use No     Health Maintenance Review:  Immunizations,  Tetanus -?  Influenza - due  Zostavax - Never  Colonoscopy: 2016, polyps, on 5 year schedule  Glaucoma Screen - 7 years  ?PSA - ? Every 2 years    There is no problem list on file for this patient.      Current Outpatient Prescriptions:   ???  B INFANTIS/B ANI/B LON/B BIFID (PROBIOTIC 4X PO), Take  by mouth., Disp: , Rfl:   ???  cholecalciferol, vitamin D3, (VITAMIN D3) 2,000 unit tab, Take  by mouth., Disp: , Rfl:   ???  aspirin delayed-release 81 mg tablet, Take  by mouth daily., Disp: , Rfl:   ???  propranolol (INDERAL) 20 mg tablet, Take  by mouth three (3) times daily., Disp: , Rfl:   ???  gemfibrozil (LOPID) 600 mg tablet, Take 600 mg by mouth two (2) times a day., Disp: , Rfl:    ???  metFORMIN (GLUCOPHAGE) 500 mg tablet, Take  by mouth two (2) times daily (with meals)., Disp: , Rfl:   ???  lactulose (CHRONULAC) 10 gram/15 mL solution, Take  by mouth three (3) times daily., Disp: , Rfl:   ???  codeine-butalbital-aspirin-caffeine (BUTALBITAL COMPOUND-CODEINE) 30-50-325-40 mg per capsule, Take  by mouth every six (6) hours as needed for Pain., Disp: , Rfl:   ???  sucralfate (CARAFATE) 1 gram tablet, Take 1 g by mouth four (4) times daily., Disp: , Rfl:   ???  prochlorperazine (COMPAZINE) 10 mg tablet, Take 5 mg by mouth every six (6) hours as needed., Disp: , Rfl:   ???  citalopram (CELEXA) 40 mg tablet, Take 40 mg by mouth daily., Disp: , Rfl:   ???  rosuvastatin (CRESTOR) 10 mg tablet, Take 10 mg by mouth nightly., Disp: , Rfl:   ???  nortriptyline (PAMELOR) 50 mg capsule, Take 50 mg  by mouth nightly., Disp: , Rfl:   ???  folic acid (FOLVITE) 1 mg tablet, Take  by mouth daily., Disp: , Rfl:   ???  oxyCODONE ER (OXYCONTIN) 20 mg ER tablet, Take 20 mg by mouth every twelve (12) hours., Disp: , Rfl:   ???  LORazepam (ATIVAN) 1 mg tablet, Take  by mouth every four (4) hours as needed for Anxiety., Disp: , Rfl:   ???  tamsulosin (FLOMAX) 0.4 mg capsule, Take 0.4 mg by mouth daily., Disp: , Rfl:   ???  cyclobenzaprine (FLEXERIL) 10 mg tablet, Take  by mouth three (3) times daily as needed for Muscle Spasm(s)., Disp: , Rfl:   ???  topiramate (TOPAMAX) 100 mg tablet, Take  by mouth two (2) times a day., Disp: , Rfl:   ???  esomeprazole (NEXIUM) 20 mg capsule, Take  by mouth daily., Disp: , Rfl:   ???  multivitamin, tx-iron-ca-min (THERA-M W/ IRON) 9 mg iron-400 mcg tab tablet, Take 1 Tab by mouth daily., Disp: , Rfl:         Review of Systems   Constitutional: Negative for chills, fever and weight loss.   HENT: Negative for hearing loss.         Poor dentition in need of extractions, dentist has requested clearance for extractions under local anesthesia.   Eyes: Negative for blurred vision and double vision.    Respiratory: Negative for cough, shortness of breath and wheezing.    Cardiovascular: Negative for chest pain, palpitations and leg swelling.   Gastrointestinal: Positive for abdominal pain ("abdominal pain with cirrhosis") and heartburn (controlled on medication). Negative for constipation, diarrhea, nausea and vomiting.   Genitourinary: Negative for dysuria and urgency.   Musculoskeletal: Positive for back pain (chronic). Negative for joint pain and myalgias.   Skin: Negative for itching and rash.   Neurological: Positive for focal weakness (left leg) and headaches (migraine headaches). Negative for dizziness, tingling and sensory change.   Endo/Heme/Allergies: Negative for environmental allergies.   Psychiatric/Behavioral: Negative for depression. The patient is nervous/anxious. The patient does not have insomnia.      Visit Vitals   ??? BP 110/62 (BP 1 Location: Left arm, BP Patient Position: Sitting)   ??? Pulse 87   ??? Temp 99.4 ??F (37.4 ??C) (Oral)   ??? Resp 18   ??? Ht 5\' 9"  (1.753 m)   ??? Wt 148 lb 3.2 oz (67.2 kg)   ??? SpO2 98%   ??? BMI 21.89 kg/m2       Physical Exam   Constitutional: He is oriented to person, place, and time. He appears well-developed and well-nourished.   HENT:   Head: Normocephalic.   Right Ear: Tympanic membrane and ear canal normal.   Left Ear: Tympanic membrane and ear canal normal.   Mouth/Throat: Oropharynx is clear and moist.   Eyes: Conjunctivae and EOM are normal. Pupils are equal, round, and reactive to light.   Neck: Neck supple.   Cardiovascular: Normal rate, regular rhythm, normal heart sounds and intact distal pulses.    Pulmonary/Chest: Effort normal and breath sounds normal.   Abdominal: Soft. Bowel sounds are normal. There is no tenderness.   Musculoskeletal: He exhibits no edema.   Neurological: He is alert and oriented to person, place, and time. He has normal reflexes.   Skin: Skin is warm and dry.   Psychiatric: He has a normal mood and affect. His behavior is normal.    Nursing note and vitals reviewed.  ASSESSMENT and PLAN    ICD-10-CM ICD-9-CM    1. Chronic anemia D64.9 285.9 REFERRAL TO HEMATOLOGY ONCOLOGY      CBC WITH AUTOMATED DIFF   2. Encounter for immunization Z23 V03.89 INFLUENZA VIRUS VACCINE, HIGH DOSE SEASONAL, PRESERVATIVE FREE      CANCELED: INFLUENZA VIRUS VAC QUAD,SPLIT,PRESV FREE SYRINGE IM   3. Controlled type 2 diabetes mellitus without complication, without long-term current use of insulin (HCC) E11.9 250.00 HEMOGLOBIN A1C WITH EAG   4. Essential hypertension I10 401.9 LIPID PANEL      URINALYSIS W/ RFLX MICROSCOPIC   5. Hyperlipidemia, unspecified hyperlipidemia type [id number] METABOLIC PANEL, COMPREHENSIVE      TSH 3RD GENERATION   6. History of stroke Z86.73 V12.54    7. Anxiety F41.9 300.00 LORazepam (ATIVAN) 1 mg tablet   8. Other chronic pain G89.29 338.29    [place] Prescription Monitoring Program reviewed with no information indicating activity of concern.  Patient reports he has an appointment in 1 week with Dr. Leron Croak for pain management. Advised that I will refill lorazepam to cover interval until then.  Further disposition pending lab results if indicated.  Follow up interval based on lab results.  Cleared for dental extractions under local anesthesia.

## 2016-03-18 NOTE — Patient Instructions (Addendum)
Further disposition pending lab results if indicated.  Follow up interval based on lab results.       Well Visit, Over 81: Care Instructions  Your Care Instructions  Physical exams can help you stay healthy. Your doctor has checked your overall health and may have suggested ways to take good care of yourself. He or she also may have recommended tests. At home, you can help prevent illness with healthy eating, regular exercise, and other steps.  Follow-up care is a key part of your treatment and safety. Be sure to make and go to all appointments, and call your doctor if you are having problems. It's also a good idea to know your test results and keep a list of the medicines you take.  How can you care for yourself at home?  ?? Reach and stay at a healthy weight. This will lower your risk for many problems, such as obesity, diabetes, heart disease, and high blood pressure.  ?? Get at least 30 minutes of exercise on most days of the week. Walking is a good choice. You also may want to do other activities, such as running, swimming, cycling, or playing tennis or team sports.  ?? Do not smoke. Smoking can make health problems worse. If you need help quitting, talk to your doctor about stop-smoking programs and medicines. These can increase your chances of quitting for good.  ?? Protect your skin from too much sun. When you're outdoors from 10 a.m. to 4 p.m., stay in the shade or cover up with clothing and a hat with a wide brim. Wear sunglasses that block UV rays. Even when it's cloudy, put broad-spectrum sunscreen (SPF 30 or higher) on any exposed skin.  ?? See a dentist one or two times a year for checkups and to have your teeth cleaned.  ?? Wear a seat belt in the car.  ?? Limit alcohol to 2 drinks a day for men and 1 drink a day for women. Too much alcohol can cause health problems.  Follow your doctor's advice about when to have certain tests. These tests can spot problems early.  For men and women   ?? Cholesterol. Your doctor will tell you how often to have this done based on your overall health and other things that can increase your risk for heart attack and stroke.  ?? Blood pressure. Have your blood pressure checked during a routine doctor visit. Your doctor will tell you how often to check your blood pressure based on your age, your blood pressure results, and other factors.  ?? Diabetes. Ask your doctor whether you should have tests for diabetes.  ?? Vision. Experts recommend that you have yearly exams for glaucoma and other age-related eye problems.  ?? Hearing. Tell your doctor if you notice any change in your hearing. You can have tests to find out how well you hear.  ?? Colon cancer tests. Keep having colon cancer tests as your doctor recommends. You can have one of several types of tests.  ?? Heart attack and stroke risk. At least every 4 to 6 years, you should have your risk for heart attack and stroke assessed. Your doctor uses factors such as your age, blood pressure, cholesterol, and whether you smoke or have diabetes to show what your risk for a heart attack or stroke is over the next 10 years.  ?? Osteoporosis. Talk to your doctor about whether you should have a bone density test to find out whether you have thinning bones.  Also ask your doctor about whether you should take calcium and vitamin D supplements.  For women  ?? Pap test and pelvic exam. You may no longer need a Pap test. Talk with your doctor about whether to stop or continue to have Pap tests.  ?? Breast exam and mammogram. Ask how often you should have a mammogram, which is an X-ray of your breasts. A mammogram can spot breast cancer before it can be felt and when it is easiest to treat.  ?? Thyroid disease. Talk to your doctor about whether to have your thyroid checked as part of a regular physical exam. Women have an increased chance of a thyroid problem.  For men  ?? Prostate exam. Talk to your doctor about whether you should have a blood  test (called a PSA test) for prostate cancer. Experts disagree on whether men should have this test. Some experts recommend that you discuss the benefits and risks of the test with your doctor.  ?? Abdominal aortic aneurysm. Ask your doctor whether you should have a test to check for an aneurysm. You may need a test if you ever smoked or if your parent, brother, sister, or child has had an aneurysm.  When should you call for help?  Watch closely for changes in your health, and be sure to contact your doctor if you have any problems or symptoms that concern you.  Where can you learn more?  Go to [email or web address].  Enter [phone] in the search box to learn more about "Well Visit, Over 65: Care Instructions."  Current as of: January 14, 2015  Content Version: 11.3  ?? 2006-2017 [employer]wise[employer]. Care instructions adapted under license by [employer] (which disclaims liability or warranty for this information). If you have questions about a medical condition or this instruction, always ask your healthcare professional. [place] any warranty or liability for your use of this information.

## 2016-03-22 NOTE — Telephone Encounter (Signed)
Please change referral to Dr. Mercie Eon at [facility] hematology/oncology

## 2016-03-22 NOTE — Telephone Encounter (Signed)
DR Lowry Bowl office called in regards to a referral they received. They do not accept his ins and will need to be referred to someone else. They have already contacted the pt and let him know. Please advise.

## 2016-03-23 NOTE — Telephone Encounter (Signed)
Done

## 2016-03-24 ENCOUNTER — Encounter

## 2016-03-24 NOTE — Telephone Encounter (Signed)
[employer] referral Form was submitted to [facility] @ [phone]. Confirmation received. Also form faxed to Dr. Nolberto Hanlon office @ [phone]. Confirmation received

## 2016-03-24 NOTE — Telephone Encounter (Signed)
Referral faxed to number given. Confirmation received

## 2016-03-24 NOTE — Telephone Encounter (Signed)
Attempted to call the office to get more info such as the tax id number, diagnosis code. L/m for pt scheduler stating to  call back

## 2016-03-24 NOTE — Telephone Encounter (Signed)
Per pt's wife he will be seen for back pain, she states it was discussed when they were here

## 2016-03-24 NOTE — Telephone Encounter (Signed)
Spoke with rep given info needed

## 2016-03-24 NOTE — Telephone Encounter (Signed)
Referral printed

## 2016-03-24 NOTE — Telephone Encounter (Signed)
Dr. Nolberto Hanlon office called and they received the Faxed referral from [place], however the patient has [employer] (Medicare replacement) and requires and insurance referral.     Patient did sign a waiver to be seen today but the office is asking that we try to get the referral today so that the patient is covered for this and upcoming visits.

## 2016-03-24 NOTE — Telephone Encounter (Signed)
Pt's wife called stating the pt has an appt with Dr. Evette Georges at [facility] today at 12:30 and they did not realize the pt needed a referral.    Referral can be faxed to: [phone]    Please advise

## 2016-03-30 MED ORDER — GEMFIBROZIL 600 MG TAB
600 mg | ORAL_TABLET | Freq: Two times a day (BID) | ORAL | 1 refills | Status: DC
Start: 2016-03-30 — End: 2016-05-23

## 2016-03-30 NOTE — Telephone Encounter (Signed)
Rx for gemfibrozil sent to pharmacy

## 2016-03-30 NOTE — Telephone Encounter (Signed)
Realized they did need a few meds refills, only has enough for today.

## 2016-04-06 ENCOUNTER — Encounter: Attending: Medical Oncology | Primary: Legal Medicine

## 2016-04-21 NOTE — Telephone Encounter (Signed)
Spoke with patient to confirm visit and $40 copay

## 2016-04-22 ENCOUNTER — Inpatient Hospital Stay: Admit: 2016-04-22 | Payer: MEDICARE | Primary: Legal Medicine

## 2016-04-22 ENCOUNTER — Ambulatory Visit
Admit: 2016-04-22 | Discharge: 2016-04-22 | Payer: PRIVATE HEALTH INSURANCE | Attending: Medical Oncology | Primary: Legal Medicine

## 2016-04-22 DIAGNOSIS — D509 Iron deficiency anemia, unspecified: Secondary | ICD-10-CM

## 2016-04-22 DIAGNOSIS — Z452 Encounter for adjustment and management of vascular access device: Secondary | ICD-10-CM

## 2016-04-22 LAB — CBC WITH AUTOMATED DIFF
ABS. BASOPHILS: 0 10*3/uL (ref 0.0–0.06)
ABS. EOSINOPHILS: 0.1 10*3/uL (ref 0.0–0.4)
ABS. LYMPHOCYTES: 0.7 10*3/uL — ABNORMAL LOW (ref 0.9–3.6)
ABS. MONOCYTES: 0.3 10*3/uL (ref 0.05–1.2)
ABS. NEUTROPHILS: 2.5 10*3/uL (ref 1.8–8.0)
BASOPHILS: 1 % (ref 0–2)
EOSINOPHILS: 3 % (ref 0–5)
HCT: 22.6 % — ABNORMAL LOW (ref 36.0–48.0)
HGB: 6.4 g/dL — ABNORMAL LOW (ref 13.0–16.0)
LYMPHOCYTES: 19 % — ABNORMAL LOW (ref 21–52)
MCH: 22 PG — ABNORMAL LOW (ref 24.0–34.0)
MCHC: 28.3 g/dL — ABNORMAL LOW (ref 31.0–37.0)
MCV: 77.7 FL (ref 74.0–97.0)
MONOCYTES: 9 % (ref 3–10)
MPV: 10.1 FL (ref 9.2–11.8)
NEUTROPHILS: 68 % (ref 40–73)
PLATELET: 97 10*3/uL — ABNORMAL LOW (ref 135–420)
RBC: 2.91 M/uL — ABNORMAL LOW (ref 4.70–5.50)
RDW: 16.4 % — ABNORMAL HIGH (ref 11.6–14.5)
WBC: 3.6 10*3/uL — ABNORMAL LOW (ref 4.6–13.2)

## 2016-04-22 LAB — METABOLIC PANEL, COMPREHENSIVE
A-G Ratio: 1.1 (ref 0.8–1.7)
ALT (SGPT): 25 U/L (ref 16–61)
AST (SGOT): 28 U/L (ref 15–37)
Albumin: 3.9 g/dL (ref 3.4–5.0)
Alk. phosphatase: 208 U/L — ABNORMAL HIGH (ref 45–117)
Anion gap: 8 mmol/L (ref 3.0–18)
BUN/Creatinine ratio: 9 — ABNORMAL LOW (ref 12–20)
BUN: 9 MG/DL (ref 7.0–18)
Bilirubin, total: 0.4 MG/DL (ref 0.2–1.0)
CO2: 23 mmol/L (ref 21–32)
Calcium: 9.2 MG/DL (ref 8.5–10.1)
Chloride: 107 mmol/L (ref 100–108)
Creatinine: 1.01 MG/DL (ref 0.6–1.3)
GFR est AA: >60 mL/min/{1.73_m2} (ref >60)
GFR est non-AA: >60 mL/min/{1.73_m2} (ref >60)
Globulin: 3.6 g/dL (ref 2.0–4.0)
Glucose: 113 mg/dL — ABNORMAL HIGH (ref 74–99)
Potassium: 4.5 mmol/L (ref 3.5–5.5)
Protein, total: 7.5 g/dL (ref 6.4–8.2)
Sodium: 138 mmol/L (ref 136–145)

## 2016-04-22 LAB — C REACTIVE PROTEIN, QT: C-Reactive protein: <0.3 mg/dL (ref 0–0.3)

## 2016-04-22 LAB — RETICULOCYTE COUNT: Reticulocyte count: 2.2 % (ref 0.5–2.3)

## 2016-04-22 LAB — IRON PROFILE
Iron % saturation: 3 %
Iron: 17 ug/dL — ABNORMAL LOW (ref 50–175)
TIBC: 492 ug/dL — ABNORMAL HIGH (ref 250–450)

## 2016-04-22 LAB — FERRITIN: Ferritin: 4 NG/ML — ABNORMAL LOW (ref 8–388)

## 2016-04-22 NOTE — Progress Notes (Signed)
[facility] Progress Note    Date: April 22, 2016    Name: KAMAHAO STEFFENSMEIER Sr.    MRN: [id number]         DOB: 11-22-1945      Mr. Arabie arrived to [facility] at 1530 for peripheral lab draw for Dr. Mercie Eon.    Mr. Colarossi vitals were reviewed.  Visit Vitals   ??? BP 97/58 (BP 1 Location: Left arm, BP Patient Position: Sitting)   ??? Pulse 77   ??? Temp 98 ??F (36.7 ??C)   ??? Resp 17   ??? SpO2 100%       Blood drawn for labs via Left antecubital  venipuncture x1 attempt. Gauze and coban applied to site.     Mr. Garnier tolerated well without complaints.    Mr. Nickell was discharged from [facility] in stable condition at 1540.    [name], RN  April 22, 2016

## 2016-04-22 NOTE — Progress Notes (Signed)
[facility] HEMATOLOGY-MEDICAL ONCOLOGY:     Patient: William MAGNUS Sr. Age: 70 y.o. Sex: male    Date of Birth: 02/29/1946 Admit Date: (Not on file) PCP: Lenice Pressman, MD   MRN: [id number]  CSN: [id number]          Patient Active Problem List   Diagnosis Code   ??? Chronic anemia D64.9   ??? Controlled type 2 diabetes mellitus without complication, without long-term current use of insulin ([place]) E11.9   ??? Essential hypertension I10   ??? Hyperlipidemia E78.5   ??? History of stroke Z86.73   ??? Anxiety F41.9   ??? Chronic pain G89.29   ??? Iron deficiency anemia D50.9         Assessment/ Plan:     1.)  Iron Deficiency Anemia-IDA  -Patient with history of poor iron absorption  -Patient requiring frequent IV iron replacement  -His most recent replacement with IV iron was July of this year (July 2017)  -Repeat a comprehensive hematologic laboratory workup today including a CBC, CMP, and iron studies  -Plan for replacement with IV iron with Injectafer next week  -He reports that he generally needs his IV iron replaced every 2-3 months.  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 I have reviewed the plan of care with the patient, accepted their input and they are in agreement with the treatment goals. Patient has provided input and agrees with goals.    History:   William TOMASINO Sr. is a 70 y.o. male presenting today for:      Iron Deficiency Anemia-IDA  -Patient with history of poor iron absorption  -Patient requiring frequent IV iron replacement    Today he is complaining of generalized fatigue and weakness.  He denies fever, chills, sweats, chest pain, hemoptysis, hematochezia, melena, hematuria, dysuria, paresthesias, and or loss of consciousness.  He does get short of breath with exertion.    Current Outpatient Prescriptions   Medication Sig Dispense Refill    ??? B INFANTIS/B ANI/B LON/B BIFID (PROBIOTIC 4X PO) Take  by mouth.     ??? cholecalciferol, vitamin D3, (VITAMIN D3) 2,000 unit tab Take  by mouth.     ??? aspirin delayed-release 81 mg tablet Take  by mouth daily.     ??? propranolol (INDERAL) 20 mg tablet Take  by mouth three (3) times daily.     ??? metFORMIN (GLUCOPHAGE) 500 mg tablet Take  by mouth two (2) times daily (with meals).     ??? lactulose (CHRONULAC) 10 gram/15 mL solution Take  by mouth three (3) times daily.     ??? codeine-butalbital-aspirin-caffeine (BUTALBITAL COMPOUND-CODEINE) 30-50-325-40 mg per capsule Take  by mouth every six (6) hours as needed for Pain.     ??? sucralfate (CARAFATE) 1 gram tablet Take 1 g by mouth four (4) times daily.     ??? prochlorperazine (COMPAZINE) 10 mg tablet Take 5 mg by mouth every six (6) hours as needed.     ??? citalopram (CELEXA) 40 mg tablet Take 40 mg by mouth daily.     ??? rosuvastatin (CRESTOR) 10 mg tablet Take 10 mg by mouth nightly.     ??? nortriptyline (PAMELOR) 50 mg capsule Take 50 mg by mouth nightly.     ??? oxyCODONE ER (OXYCONTIN) 20  mg ER tablet Take 20 mg by mouth every twelve (12) hours.     ??? tamsulosin (FLOMAX) 0.4 mg capsule Take 0.4 mg by mouth daily.     ??? cyclobenzaprine (FLEXERIL) 10 mg tablet Take  by mouth three (3) times daily as needed for Muscle Spasm(s).     ??? topiramate (TOPAMAX) 100 mg tablet Take  by mouth two (2) times a day.     ??? esomeprazole (NEXIUM) 20 mg capsule Take  by mouth daily.     ??? multivitamin, tx-iron-ca-min (THERA-M W/ IRON) 9 mg iron-400 mcg tab tablet Take 1 Tab by mouth daily.     ??? LORazepam (ATIVAN) 1 mg tablet Take 1 Tab by mouth every six (6) hours as needed for Anxiety. Max Daily Amount: 4 mg. 28 Tab 0   ??? gemfibrozil (LOPID) 600 mg tablet Take 1 Tab by mouth two (2) times a day. 60 Tab 1   ??? folic acid (FOLVITE) 1 mg tablet Take  by mouth daily.         Objective:   VS:    Visit Vitals   ??? BP (!) 87/48 (BP 1 Location: Left arm, BP Patient Position: Sitting)   ??? Pulse 77    ??? Temp 98 ??F (36.7 ??C) (Oral)   ??? Resp 17   ??? Ht 5\' 9"  (1.753 m)   ??? Wt 69.9 kg (154 lb)   ??? SpO2 100%   ??? BMI 22.74 kg/m2     ??  Physical Exam:  ??  Constitutional:?? no acute distress and alert and oriented x 3, pale appearing   HENT:?? atraumatic   Eyes:?? EOMI, PERRLA   Neck:?? No palpable masses   Cardiovascular:?? heart sounds normal, S1, S2   Pulmonary/Chest Wall:?? breath sounds are clear   Abdominal:?? Non tender, no palpable masses       Musculoskeletal:?? No deformities   Skin:??  Positive for pallor but no rash   Peripheral Vascular:?? Pulses palpable       Review of Systems:  The remainder of 12 point ROS was otherwise negative except for what was reported in the CC/HPI:      No results found for this or any previous visit (from the past 168 hour(s)).    Past Medical History:   Diagnosis Date   ??? Anxiety    ??? Cirrhosis of liver ([place])    ??? Diabetes ([place])    ??? GERD (gastroesophageal reflux disease)    ??? Hyperlipemia    ??? Hypertension    ??? Migraine    ??? Stroke [facility])     had 3 strokes        Past Surgical History:   Procedure Laterality Date   ??? HX CHOLECYSTECTOMY     ??? HX ORTHOPAEDIC      R hand sx       Social History     Social History   ??? Marital status: MARRIED     Spouse name: N/A   ??? Number of children: N/A   ??? Years of education: N/A     Occupational History   ??? Not on file.     Social History Main Topics   ??? Smoking status: Former Smoker   ??? Smokeless tobacco: Former Systems developer      Comment: quit years ago   ??? Alcohol use No   ??? Drug use: Not on file   ??? Sexual activity: Yes     Partners: Male     Other Topics  Concern   ??? Not on file     Social History Narrative       Family History   Problem Relation Age of Onset   ??? Cancer Mother      colon cancer    ??? Cancer Father      prostate, lung cancer    ??? Heart Disease Father    ??? Diabetes Sister    ??? Heart Disease Sister    ??? Diabetes Brother    ??? Hypertension Neg Hx        Allergies   Allergen Reactions   ??? Gabapentin Other (comments)     Per pt became violent     ??? Lyrica [Pregabalin] Other (comments)     Per pt stroke symptoms   ??? Penicillins Hives       Follow-up Disposition: Not on File    Dewey Neukam B. Mercie Eon, MD, MPH Hematology-Medical Oncology  April 22, 2016 2:39 PM

## 2016-04-22 NOTE — Progress Notes (Signed)
William Gell Sr. is a 70 y.o. male who presents today to establish care.   Pt educated on fall prevention, pt verbalized understanding.     1. Have you been to the ER, urgent care clinic since your last visit?  Hospitalized since your last visit?NO    2. Have you seen or consulted any other health care providers outside of the [facility] since your last visit?  Include any pap smears or colon screening.NO    Health Maintenance reviewed.    Health Maintenance Due   Topic Date Due   ??? Hepatitis C Screening  [birth year]   ??? HEMOGLOBIN A1C Q6M  1946-03-13   ??? LIPID PANEL Q1  28-Jul-1945   ??? FOOT EXAM Q1  03/19/1958   ??? MICROALBUMIN Q1  03/19/1958   ??? EYE EXAM RETINAL OR DILATED Q1  03/19/1958   ??? DTaP/Tdap/Td series (1 - Tdap) 03/19/1969   ??? FOBT Q 1 YEAR AGE 76-75  03/19/1998   ??? ZOSTER VACCINE AGE 17>  01/17/2008   ??? GLAUCOMA SCREENING Q2Y  03/19/2013   ??? Pneumococcal 65+ Low/Medium Risk (1 of 2 - PCV13) 03/19/2013   ??? MEDICARE YEARLY EXAM  03/19/2013

## 2016-04-23 MED ORDER — FERRIC CARBOXYMALTOSE 50 MG IRON/ML INTRAVENOUS SOLUTION
50 mg iron/mL | Freq: Once | INTRAVENOUS | Status: AC
Start: 2016-04-23 — End: 2016-04-27
  Administered 2016-04-27: 19:00:00 via INTRAVENOUS

## 2016-04-23 MED FILL — INJECTAFER 50 MG IRON/ML INTRAVENOUS SOLUTION: 50 mg iron/mL | INTRAVENOUS | Qty: 15

## 2016-04-27 ENCOUNTER — Ambulatory Visit: Attending: Medical Oncology | Primary: Legal Medicine

## 2016-04-27 ENCOUNTER — Inpatient Hospital Stay: Admit: 2016-04-27 | Payer: MEDICARE | Primary: Legal Medicine

## 2016-04-27 ENCOUNTER — Encounter: Attending: Medical Oncology | Primary: Legal Medicine

## 2016-04-27 MED ORDER — SODIUM CHLORIDE 0.9 % IJ SYRG
INTRAMUSCULAR | Status: DC | PRN
Start: 2016-04-27 — End: 2016-05-01
  Administered 2016-04-27 (×2): via INTRAVENOUS

## 2016-04-27 MED FILL — BD POSIFLUSH NORMAL SALINE 0.9 % INJECTION SYRINGE: INTRAMUSCULAR | Qty: 40

## 2016-04-27 NOTE — Progress Notes (Signed)
Patient received first dose of IV Injectafer today

## 2016-04-27 NOTE — Progress Notes (Signed)
[facility] OPIC Progress Note    Date: April 27, 2016    Name: ROCHELL VAZZANA Sr.    MRN: [id number]         DOB: 08-28-1945    Injectafer Infusion Day 1.    Mr. Mordecai to [facility], ambulatory, at 1415 accompanied by spouse. Pt was assessed and education was provided.     Mr. Mazon vitals were reviewed and patient was observed for 5 minutes prior to treatment.   Visit Vitals   ??? BP 102/59   ??? Pulse 78   ??? Temp 97.8 ??F (36.6 ??C)   ??? Resp 18   ??? SpO2 99%         24 g PIV placed in Right AC x 1 attempt. PIV flushed easily and had brisk blood return.    Injectafer 750  mg was administered over 20 minutes.     Education provided. Patient politely refused 30 minute observation.    Mr. Hull tolerated the infusion, and had no complaints. VS remained stable.     Patient Vitals for the past 12 hrs:   Temp Pulse Resp BP SpO2   04/27/16 1452 - 78 - 102/59 -   04/27/16 1420 97.8 ??F (36.6 ??C) 83 18 101/65 99 %         PIV flushed with NS 10 ml and removed. No bleeding or hematoma noted at site. Guaze and coban applied.    Reviewed discharge instructions with patient, including expected side effects (nausea). Patient given printed copy to take home. Patient verbalized understanding of discharge instructions.    Patient armband removed and shredded.    Mr. Badia was discharged from [place] in stable condition at 1455. He is to return on 05/04/16 at 1400 for his next [facility] appointment.    Alean Rinne  April 27, 2016  3:52 PM

## 2016-04-29 ENCOUNTER — Encounter: Payer: MEDICARE | Primary: Legal Medicine

## 2016-04-29 ENCOUNTER — Encounter: Attending: Medical Oncology | Primary: Legal Medicine

## 2016-04-30 MED ORDER — FERRIC CARBOXYMALTOSE 50 MG IRON/ML INTRAVENOUS SOLUTION
50 mg iron/mL | Freq: Once | INTRAVENOUS | Status: AC
Start: 2016-04-30 — End: 2016-05-04
  Administered 2016-05-04: 19:00:00 via INTRAVENOUS

## 2016-04-30 MED FILL — INJECTAFER 50 MG IRON/ML INTRAVENOUS SOLUTION: 50 mg iron/mL | INTRAVENOUS | Qty: 15

## 2016-05-04 ENCOUNTER — Inpatient Hospital Stay: Admit: 2016-05-04 | Payer: MEDICARE | Primary: Legal Medicine

## 2016-05-04 DIAGNOSIS — D509 Iron deficiency anemia, unspecified: Secondary | ICD-10-CM

## 2016-05-04 MED ORDER — SODIUM CHLORIDE 0.9 % IJ SYRG
INTRAMUSCULAR | Status: DC | PRN
Start: 2016-05-04 — End: 2016-05-07
  Administered 2016-05-04: 19:00:00 via INTRAVENOUS

## 2016-05-04 MED ORDER — ROSUVASTATIN 10 MG TAB
10 mg | ORAL_TABLET | ORAL | 5 refills | Status: DC
Start: 2016-05-04 — End: 2016-06-30

## 2016-05-04 MED FILL — BD POSIFLUSH NORMAL SALINE 0.9 % INJECTION SYRINGE: INTRAMUSCULAR | Qty: 40

## 2016-05-04 NOTE — Progress Notes (Signed)
[facility] OPIC Progress Note    Date: May 04, 2016    Name: William FAVRET Sr.    MRN: [id number]         DOB: April 26, 1946    Injectafer Infusion Day 2.    Mr. [name] to [facility], ambulatory, at 1420 accompanied by spouse. Pt was assessed and education was provided.     Mr. [name] vitals were reviewed and patient was observed for 5 minutes prior to treatment.   Visit Vitals   ??? BP 105/65   ??? Pulse 75   ??? Temp 99 ??F (37.2 ??C)   ??? Resp 18   ??? SpO2 100%         23 g butterfly needle placed in Right FA x 1 attempt. PIV flushed easily and had brisk blood return.    Injectafer 750  mg was administered over 8 minutes.       Mr. [name] tolerated the infusion, and had no complaints. VS remained stable.     Patient Vitals for the past 12 hrs:   Temp Pulse Resp BP SpO2   05/04/16 1438 - 75 - 105/65 -   05/04/16 1422 99 ??F (37.2 ??C) 79 18 109/64 100 %         PIV flushed with NS 10 ml and removed. No bleeding or hematoma noted at site. Guaze and coban applied.    Patient armband removed and shredded.    Mr. [name] was discharged from [place] in stable condition at 1440. He is to follow up with Dr. Mercie Eon as needed.     Allisyn Kunz  May 04, 2016  3:52 PM

## 2016-05-23 ENCOUNTER — Other Ambulatory Visit: Payer: Self-pay | Admitting: Gastroenterology

## 2016-05-23 MED ORDER — GEMFIBROZIL 600 MG TAB
600 mg | ORAL_TABLET | ORAL | 0 refills | Status: DC
Start: 2016-05-23 — End: 2016-06-30

## 2016-05-23 NOTE — Telephone Encounter (Signed)
[name] never returned for fasting lab ordered at his establish care appointment. Please advise him that this is the last time I can refill gemfibrozil or any other medication without lab results and and office follow up if lab results indicate.

## 2016-05-26 ENCOUNTER — Other Ambulatory Visit: Payer: Self-pay | Admitting: Nurse Practitioner

## 2016-05-31 ENCOUNTER — Inpatient Hospital Stay: Admit: 2016-05-31 | Payer: MEDICARE | Primary: Legal Medicine

## 2016-05-31 ENCOUNTER — Ambulatory Visit
Admit: 2016-05-31 | Discharge: 2016-05-31 | Payer: PRIVATE HEALTH INSURANCE | Attending: Medical Oncology | Primary: Legal Medicine

## 2016-05-31 DIAGNOSIS — D509 Iron deficiency anemia, unspecified: Secondary | ICD-10-CM

## 2016-05-31 DIAGNOSIS — D500 Iron deficiency anemia secondary to blood loss (chronic): Secondary | ICD-10-CM

## 2016-05-31 LAB — IRON PROFILE
Iron % saturation: 24 %
Iron: 83 ug/dL (ref 50–175)
TIBC: 343 ug/dL (ref 250–450)

## 2016-05-31 LAB — CBC WITH 3 PART DIFF
ABS. LYMPHOCYTES: 0.6 10*3/uL — ABNORMAL LOW (ref 1.1–5.9)
ABS. MIXED CELLS: 0.4 10*3/uL (ref 0.0–2.3)
ABS. NEUTROPHILS: 2.5 10*3/uL (ref 1.8–9.5)
HCT: 34.7 % — ABNORMAL LOW (ref 36–48)
HGB: 10.8 g/dL — ABNORMAL LOW (ref 12.0–16.0)
LYMPHOCYTES: 17 % (ref 14–44)
MCH: 29.7 PG (ref 25.0–35.0)
MCHC: 31.1 g/dL (ref 31–37)
MCV: 95.3 FL (ref 78–102)
Mixed cells: 12 % (ref 0.1–17)
NEUTROPHILS: 71 % — ABNORMAL HIGH (ref 40–70)
PLATELET: 70 10*3/uL — ABNORMAL LOW (ref 140–440)
RBC: 3.64 M/uL — ABNORMAL LOW (ref 4.10–5.10)
RDW: 24.2 % — ABNORMAL HIGH (ref 11.5–14.5)
WBC: 3.5 10*3/uL — ABNORMAL LOW (ref 4.5–13.0)

## 2016-05-31 LAB — FERRITIN: Ferritin: 189 NG/ML (ref 8–388)

## 2016-05-31 NOTE — Progress Notes (Signed)
[facility] OPIC Progress Note    Date: May 31, 2016    Name: William JOENS Sr.    MRN: [id number]         DOB: February 26, 1946      [name] arrived to [facility] at [place] for peripheral lab draw for Dr. Mercie Eon.    [name] vitals were reviewed.  Visit Vitals   ??? BP 95/56 (BP 1 Location: Right arm, BP Patient Position: Sitting)   ??? Pulse 71   ??? Temp 97.8 ??F (36.6 ??C)   ??? Resp 17   ??? SpO2 100%     Recent Results (from the past 12 hour(s))   CBC WITH 3 PART DIFF    Collection Time: 05/31/16  3:37 PM   Result Value Ref Range    WBC 3.5 (L) 4.5 - 13.0 K/uL    RBC 3.64 (L) 4.10 - 5.10 M/uL    HGB 10.8 (L) 12.0 - 16.0 g/dL    HCT 34.7 (L) 36 - 48 %    MCV 95.3 78 - 102 FL    MCH 29.7 25.0 - 35.0 PG    MCHC 31.1 31 - 37 g/dL    RDW 24.2 (H) 11.5 - 14.5 %    PLATELET 70 (L) 140 - 440 K/uL    NEUTROPHILS 71 (H) 40 - 70 %    MIXED CELLS 12 0.1 - 17 %    LYMPHOCYTES 17 14 - 44 %    ABS. NEUTROPHILS 2.5 1.8 - 9.5 K/UL    ABS. MIXED CELLS 0.4 0.0 - 2.3 K/uL    ABS. LYMPHOCYTES 0.6 (L) 1.1 - 5.9 K/UL    DF AUTOMATED           Blood drawn for labs via Right antecubital venipuncture x1 attempt using 23g butterfly collection set. Gauze and coban applied to site. Specimen collected for cbc w/diff, Iron profile, and ferritin.    [name] tolerated well without complaints.    [name] was discharged from [place] in stable condition at 1535.    [name], RN  May 31, 2016

## 2016-05-31 NOTE — Progress Notes (Signed)
[place] HEMATOLOGY-MEDICAL ONCOLOGY:     Patient: William HongGary C Pelzer Sr. Age: 70 y.o. Sex: male    Date of Birth: 04/29/1946 Admit Date: (Not on file) PCP: Dayton Bailiffobert C Juer, MD   MRN: [id number]  CSN: [id number]          Patient Active Problem List   Diagnosis Code   ??? Chronic anemia D64.9   ??? Controlled type 2 diabetes mellitus without complication, without long-term current use of insulin (HCC) E11.9   ??? Essential hypertension I10   ??? Hyperlipidemia E78.5   ??? History of stroke Z86.73   ??? Anxiety F41.9   ??? Chronic pain G89.29   ??? Iron deficiency anemia D50.9   ??? NAFLD (nonalcoholic fatty liver disease) N82.9K76.0   ??? Other cirrhosis of liver (HCC) K74.69     Assessment/ Plan:     1.)  Iron Deficiency Anemia-IDA  -Secondary to chronic GI blood loss  -Patient with history of poor iron absorption  -Patient requiring frequent IV iron replacement  -His most recent replacement with IV iron was July of this year (July 2017)  -s/p replacement with IV iron with Injectafer October/November 2017  -He reports that he generally needs his IV iron replaced every 2-3 months.  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 I have reviewed the plan of care with the patient, accepted their input and they are in agreement with the treatment goals. Patient has provided input and agrees with goals.    History:   William HongGary C Agro Sr. is a 70 y.o. male presenting today for:      Iron Deficiency Anemia-IDA  -Patient with history of poor iron absorption  -Patient requiring frequent IV iron replacement    Today he is complaining of generalized fatigue and weakness.  He denies fever, chills, sweats, chest pain, hemoptysis, hematochezia, melena, hematuria, dysuria, paresthesias, and or loss of consciousness.  He does  get short of breath with exertion.    Current Outpatient Prescriptions   Medication Sig Dispense Refill   ??? gemfibrozil (LOPID) 600 mg tablet TAKE 1 TABLET BY MOUTH TWICE DAILY 60 Tab 0   ??? rosuvastatin (CRESTOR) 10 mg tablet TAKE 1 TABLET BY MOUTH EVERY DAY 30 Tab 5   ??? B INFANTIS/B ANI/B LON/B BIFID (PROBIOTIC 4X PO) Take  by mouth.     ??? cholecalciferol, vitamin D3, (VITAMIN D3) 2,000 unit tab Take  by mouth.     ??? aspirin delayed-release 81 mg tablet Take  by mouth daily.     ??? propranolol (INDERAL) 20 mg tablet Take  by mouth three (3) times daily.     ??? metFORMIN (GLUCOPHAGE) 500 mg tablet Take  by mouth two (2) times daily (with meals).     ??? lactulose (CHRONULAC) 10 gram/15 mL solution Take  by mouth three (3) times daily.     ??? codeine-butalbital-aspirin-caffeine (BUTALBITAL COMPOUND-CODEINE) 30-50-325-40 mg per capsule Take  by mouth every six (6) hours as needed for Pain.     ??? sucralfate (CARAFATE) 1 gram tablet Take 1 g by mouth four (4) times daily.     ??? prochlorperazine (COMPAZINE) 10 mg tablet Take 5 mg by mouth every six (6) hours  as needed.     ??? citalopram (CELEXA) 40 mg tablet Take 40 mg by mouth daily.     ??? nortriptyline (PAMELOR) 50 mg capsule Take 50 mg by mouth nightly.     ??? folic acid (FOLVITE) 1 mg tablet Take  by mouth daily.     ??? oxyCODONE ER (OXYCONTIN) 20 mg ER tablet Take 20 mg by mouth every twelve (12) hours.     ??? tamsulosin (FLOMAX) 0.4 mg capsule Take 0.4 mg by mouth daily.     ??? cyclobenzaprine (FLEXERIL) 10 mg tablet Take  by mouth three (3) times daily as needed for Muscle Spasm(s).     ??? topiramate (TOPAMAX) 100 mg tablet Take  by mouth two (2) times a day.     ??? esomeprazole (NEXIUM) 20 mg capsule Take  by mouth daily.     ??? multivitamin, tx-iron-ca-min (THERA-M W/ IRON) 9 mg iron-400 mcg tab tablet Take 1 Tab by mouth daily.     ??? LORazepam (ATIVAN) 1 mg tablet Take 1 Tab by mouth every six (6) hours as needed for Anxiety. Max Daily Amount: 4 mg. 28 Tab 0        Objective:   VS:    Vitals:    05/31/16 1446   BP: 96/56   Pulse: 71   Resp: 17   Temp: 97.8 ??F (36.6 ??C)   TempSrc: Oral   SpO2: 100%   Weight: 64.9 kg (143 lb)   Height: 5\' 9"  (1.753 m)   ??  Physical Exam:  ??  Constitutional:?? no acute distress and alert and oriented x 3, pale appearing   HENT:?? atraumatic   Eyes:?? EOMI, PERRLA   Neck:?? No palpable masses   Cardiovascular:??  S1, S2   Pulmonary/Chest Wall:?? clear   Abdominal:?? Non tender, no palpable masses       Musculoskeletal:?? No deformities   Skin:?? No rash   Peripheral Vascular:?? Pulses palpable       Review of Systems:  The remainder of 12 point ROS was otherwise negative except for what was reported in the CC/HPI:      No results found for this or any previous visit (from the past 168 hour(s)).    Past Medical History:   Diagnosis Date   ??? Anxiety    ??? Cirrhosis of liver ([place])    ??? Diabetes ([place])    ??? GERD (gastroesophageal reflux disease)    ??? Hyperlipemia    ??? Hypertension    ??? Migraine    ??? Stroke [facility])     had 3 strokes        Past Surgical History:   Procedure Laterality Date   ??? HX CHOLECYSTECTOMY     ??? HX ORTHOPAEDIC      R hand sx       Social History     Social History   ??? Marital status: MARRIED     Spouse name: N/A   ??? Number of children: N/A   ??? Years of education: N/A     Occupational History   ??? Not on file.     Social History Main Topics   ??? Smoking status: Former Smoker   ??? Smokeless tobacco: Former Systems developer      Comment: quit years ago   ??? Alcohol use No   ??? Drug use: Not on file   ??? Sexual activity: Yes     Partners: Male     Other Topics Concern   ??? Not on  file     Social History Narrative       Family History   Problem Relation Age of Onset   ??? Cancer Mother      colon cancer    ??? Cancer Father      prostate, lung cancer    ??? Heart Disease Father    ??? Diabetes Sister    ??? Heart Disease Sister    ??? Diabetes Brother    ??? Hypertension Neg Hx        Allergies   Allergen Reactions   ??? Gabapentin Other (comments)     Per pt became violent     ??? Lyrica [Pregabalin] Other (comments)     Per pt stroke symptoms   ??? Penicillins Hives       Follow-up Disposition: Not on File    Soundra Lampley B. Mercie Eon, MD, MPH Hematology-Medical Oncology  May 31, 2016 2:39 PM

## 2016-05-31 NOTE — Progress Notes (Signed)
William Gell Sr. is a 70 y.o. male who presents today for follow up  Pt educated on fall prevention, pt verbalized understanding.     1. Have you been to the ER, urgent care clinic since your last visit?  Hospitalized since your last visit?NO    2. Have you seen or consulted any other health care providers outside of the [facility] since your last visit?  Include any pap smears or colon screening.NO    Health Maintenance reviewed.    Health Maintenance Due   Topic Date Due   ??? Hepatitis C Screening  1946/02/28   ??? HEMOGLOBIN A1C Q6M  Aug 24, 1945   ??? LIPID PANEL Q1  1945/09/10   ??? FOOT EXAM Q1  03/19/1958   ??? MICROALBUMIN Q1  03/19/1958   ??? EYE EXAM RETINAL OR DILATED Q1  03/19/1958   ??? DTaP/Tdap/Td series (1 - Tdap) 03/19/1969   ??? FOBT Q 1 YEAR AGE 62-75  03/19/1998   ??? ZOSTER VACCINE AGE 72>  01/17/2008   ??? GLAUCOMA SCREENING Q2Y  03/19/2013   ??? Pneumococcal 65+ Low/Medium Risk (1 of 2 - PCV13) 03/19/2013   ??? MEDICARE YEARLY EXAM  03/19/2013

## 2016-06-22 ENCOUNTER — Other Ambulatory Visit: Payer: Self-pay | Admitting: Gastroenterology

## 2016-06-22 NOTE — Telephone Encounter (Signed)
See previous note-patient never returned for lab and was previously advised that prescriptions would not be refilled.

## 2016-06-23 NOTE — Telephone Encounter (Signed)
Patient last seen in 2016. Moved back to [place]. I will refill carafate X1. It really has limited uses long term.

## 2016-06-23 NOTE — Telephone Encounter (Signed)
L/m for pt on personal voicemail stating lab work and follow up needed before refill

## 2016-06-25 NOTE — Telephone Encounter (Signed)
Dr. Nolberto Hanlon office is stating they haven't received a referral for the patient and didn't see him due to this.     Please verify referral was completed and re-send over.     Pt is scheduled for a follow-up appointment with Dr. Harley Alto on 07/01/16.

## 2016-06-29 NOTE — Telephone Encounter (Signed)
Referral, [facility] and [facility] referral form was re-faxed over to Dr. Shon Hale office @ [phone]. Confirmation received

## 2016-06-30 ENCOUNTER — Ambulatory Visit
Admit: 2016-06-30 | Discharge: 2016-06-30 | Payer: PRIVATE HEALTH INSURANCE | Attending: Family Medicine | Primary: Legal Medicine

## 2016-06-30 DIAGNOSIS — G43019 Migraine without aura, intractable, without status migrainosus: Secondary | ICD-10-CM

## 2016-06-30 DIAGNOSIS — E119 Type 2 diabetes mellitus without complications: Secondary | ICD-10-CM

## 2016-06-30 MED ORDER — KETOROLAC TROMETHAMINE 30 MG/ML INJECTION
30 mg/mL (1 mL) | Freq: Once | INTRAMUSCULAR | 0 refills | Status: AC
Start: 2016-06-30 — End: 2016-06-30

## 2016-06-30 MED ORDER — GEMFIBROZIL 600 MG TAB
600 mg | ORAL_TABLET | ORAL | 3 refills | Status: DC
Start: 2016-06-30 — End: 2017-02-14

## 2016-06-30 MED ORDER — TOPIRAMATE 100 MG TAB
100 mg | ORAL_TABLET | Freq: Every day | ORAL | 3 refills | Status: DC
Start: 2016-06-30 — End: 2017-06-29

## 2016-06-30 MED ORDER — DOXYCYCLINE HYCLATE 100 MG CAP
100 mg | ORAL_CAPSULE | ORAL | 0 refills | Status: DC
Start: 2016-06-30 — End: 2016-07-05

## 2016-06-30 MED ORDER — CITALOPRAM 40 MG TAB
40 mg | ORAL_TABLET | Freq: Every day | ORAL | 3 refills | Status: DC
Start: 2016-06-30 — End: 2017-06-30

## 2016-06-30 MED ORDER — PROCHLORPERAZINE MALEATE 10 MG TAB
10 mg | ORAL_TABLET | Freq: Four times a day (QID) | ORAL | 5 refills | Status: DC | PRN
Start: 2016-06-30 — End: 2017-02-14

## 2016-06-30 NOTE — Telephone Encounter (Signed)
Pharmacist is calling about medication interaction.  Citalopram interacts with Notripaline.  Please Advise

## 2016-06-30 NOTE — Progress Notes (Signed)
Pt aware

## 2016-06-30 NOTE — Progress Notes (Signed)
William Gell Sr. is a 71 y.o. male (DOB: 1946-01-10) presenting to address:    Chief Complaint   Patient presents with   ??? Medication Evaluation   ??? Diabetes       Vitals:    06/30/16 1427   Weight: 145 lb 9.6 oz (66 kg)   Height: 5\' 9"  (1.753 m)   PainSc:  10 - Worst pain ever   PainLoc: Head       Hearing/Vision:   No exam data present    Learning Assessment:     Learning Assessment 03/18/2016   PRIMARY LEARNER Patient   HIGHEST LEVEL OF EDUCATION - PRIMARY LEARNER  SOME COLLEGE   BARRIERS PRIMARY LEARNER NONE   CO-LEARNER CAREGIVER No   PRIMARY LANGUAGE ENGLISH   LEARNER PREFERENCE PRIMARY PICTURES     DEMONSTRATION   ANSWERED BY patient   RELATIONSHIP SELF     Depression Screening:     PHQ over the last two weeks 06/30/2016   Little interest or pleasure in doing things Not at all   Feeling down, depressed or hopeless Not at all   Total Score PHQ 2 0     Fall Risk Assessment:     Fall Risk Assessment, last 12 mths 06/30/2016   Able to walk? Yes   Fall in past 12 months? No     Abuse Screening:   No flowsheet data found.  Coordination of Care Questionaire:   1. Have you been to the ER, urgent care clinic since your last visit?  Hospitalized since your last visit? NO    2. Have you seen or consulted any other health care providers outside of the [facility] since your last visit?  Include any pap smears or colon screening. YES    Advanced Directive:   1. Do you have an Advanced Directive? NO    2. Would you like information on Advanced Directives? NO

## 2016-06-30 NOTE — Addendum Note (Signed)
Addended by: Lenice Pressman on: 06/30/2016 12:07 PM      Modules accepted: Orders

## 2016-06-30 NOTE — Progress Notes (Addendum)
HPI:  [name] presents today with C/O intractable migraine headache x 7 days and lft maxillary sinus pain and pressure with temperature elevation to 101 yesterday    Past Medical History:   Diagnosis Date   ??? Anxiety ??   ??? Cirrhosis of liver (HCC) ??   ??? Diabetes ([place]) ??   ??? GERD (gastroesophageal reflux disease) ??   ??? Hyperlipemia ??   ??? Hypertension ??   ??? Migraine ??   ??? Stroke (HCC) ??   ?? had 3 strokes    Chronic anemia-reports inability to absorb iron orally, has been receiving infusions every two months  ??        Past Surgical History:   Procedure Laterality Date   ??? HX CHOLECYSTECTOMY ?? ??   ??? HX ORTHOPAEDIC ?? ??   ?? R hand sx   ??  ??         Family History   Problem Relation Age of Onset   ??? Cancer Mother ??   ?? ?? colon cancer    ??? Cancer Father ??   ?? ?? prostate, lung cancer    ??? Heart Disease Father ??   ??? Diabetes Sister ??   ??? Heart Disease Sister ??   ??? Diabetes Brother ??   ??? Hypertension Neg Hx ??   ??  ??       History   Smoking Status   ??? Former Smoker   Smokeless Tobacco   ??? Former Systems developer   ?? ?? Comment: quit years ago   ??      History   Alcohol Use No     Patient Active Problem List   Diagnosis Code   ??? Chronic anemia D64.9   ??? Controlled type 2 diabetes mellitus without complication, without long-term current use of insulin ([employer]) E11.9   ??? Essential hypertension I10   ??? Hyperlipidemia E78.5   ??? History of stroke Z86.73   ??? Anxiety F41.9   ??? Chronic pain G89.29   ??? Iron deficiency anemia D50.9   ??? 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 Other cirrhosis of liver (HCC) K74.69   ??? Depression F32.9       Current Outpatient Prescriptions:   ???  ketorolac (TORADOL) 30 mg/mL (1 mL) injection, 1 mL by IntraVENous route once for 1 dose., Disp: 1 Vial, Rfl: 0  ???  topiramate (TOPAMAX) 100 mg tablet, Take 1 Tab by mouth daily., Disp: 90 Tab, Rfl: 3  ???  gemfibrozil (LOPID) 600 mg tablet, TAKE 1 TABLET BY MOUTH TWICE DAILY, Disp: 180 Tab, Rfl: 3   ???  prochlorperazine (COMPAZINE) 10 mg tablet, Take 0.5 Tabs by mouth every six (6) hours as needed., Disp: 30 Tab, Rfl: 5  ???  citalopram (CELEXA) 40 mg tablet, Take 1 Tab by mouth daily., Disp: 90 Tab, Rfl: 3  ???  doxycycline (VIBRAMYCIN) 100 mg capsule, Take 1 capsule twice daily with food for 10 days, remain upright for 45 minutes after each dose., Disp: 20 Cap, Rfl: 0  ???  B INFANTIS/B ANI/B LON/B BIFID (PROBIOTIC 4X PO), Take  by mouth., Disp: , Rfl:   ???  cholecalciferol, vitamin D3, (VITAMIN D3) 2,000 unit tab, Take  by mouth., Disp: , Rfl:   ???  aspirin delayed-release 81 mg tablet, Take  by mouth daily., Disp: , Rfl:   ???  propranolol (INDERAL) 20 mg tablet, Take  by mouth three (3) times daily., Disp: , Rfl:   ???  metFORMIN (GLUCOPHAGE) 500 mg tablet, Take  by mouth two (2) times daily (with meals)., Disp: , Rfl:   ???  lactulose (CHRONULAC) 10 gram/15 mL solution, Take  by mouth three (3) times daily., Disp: , Rfl:   ???  codeine-butalbital-aspirin-caffeine (BUTALBITAL COMPOUND-CODEINE) 30-50-325-40 mg per capsule, Take  by mouth every six (6) hours as needed for Pain., Disp: , Rfl:   ???  sucralfate (CARAFATE) 1 gram tablet, Take 1 g by mouth four (4) times daily., Disp: , Rfl:   ???  nortriptyline (PAMELOR) 50 mg capsule, Take 50 mg by mouth nightly., Disp: , Rfl:   ???  folic acid (FOLVITE) 1 mg tablet, Take  by mouth daily., Disp: , Rfl:   ???  oxyCODONE ER (OXYCONTIN) 20 mg ER tablet, Take 20 mg by mouth every twelve (12) hours., Disp: , Rfl:   ???  tamsulosin (FLOMAX) 0.4 mg capsule, Take 0.4 mg by mouth daily., Disp: , Rfl:   ???  cyclobenzaprine (FLEXERIL) 10 mg tablet, Take  by mouth three (3) times daily as needed for Muscle Spasm(s)., Disp: , Rfl:   ???  esomeprazole (NEXIUM) 20 mg capsule, Take  by mouth daily., Disp: , Rfl:   ???  multivitamin, tx-iron-ca-min (THERA-M W/ IRON) 9 mg iron-400 mcg tab tablet, Take 1 Tab by mouth daily., Disp: , Rfl:   Above medications as of the conclusion of today's encounter??  ??   Review of Systems   Constitutional: Negative for chills, fever to 101 yesterday  HENT: Left maxillary sinus pain and pressureEyes: Negative for blurred vision and double vision.   Respiratory: Negative for cough, shortness of breath and wheezing.    Cardiovascular: Negative for chest pain, palpitations and leg swelling.  Gastrointestinal: Frequent nausea   Musculoskeletal: Positive for back pain (chronic). Negative for joint pain and myalgias.   Neurological: Positive for migraine headache x 7 days     Psychiatric/Behavioral: Positive for depression, feels adequately controlled on citalopram.. The patient is nervous/anxious. The patient does not have insomnia.    ??                                                  ??  Visit Vitals   ??? BP 106/64 (BP 1 Location: Left arm, BP Patient Position: Standing)   ??? Pulse 77   ??? Temp 96.7 ??F (35.9 ??C) (Oral)   ??? Resp 20   ??? Ht 5\' 9"  (1.753 m)   ??? Wt 145 lb 9.6 oz (66 kg)   ??? SpO2 99%   ??? BMI 21.5 kg/m2       ??  Physical Exam   Constitutional: He is oriented to person, place, and time. He appears well-developed and well-nourished.   HENT:   Head: Normocephalic.   Tenderness to percussion over the left paranasal sinus  Right Ear: Tympanic membrane and ear canal normal.   Left Ear: Tympanic membrane and ear canal normal.   Mouth/Throat: Oropharynx is clear and moist.   Eyes: Conjunctivae and EOM are normal.   Neck: Neck supple.   Cardiovascular: Normal rate, regular rhythm, normal heart sounds and intact distal pulses.    Pulmonary/Chest: Effort normal and breath sounds normal.   Musculoskeletal: He exhibits no edema.   Neurological: He is alert and oriented to person, place, and time. He has normal reflexes.   Skin: Skin  is warm and dry.   Psychiatric: He has a normal mood and affect. His behavior is normal.   Nursing note and vitals reviewed.  ??  ??  ASSESSMENT and PLAN  P  Diagnosis ICD-10-CM ICD-9-CM ??    1. Intractable migraine without aura and without status migrainosus  ????G43.019 ????346.11         ?? 2. Controlled type 2 diabetes mellitus without complication, without long-term current use of insulin (HCC) ????E11.9 ????250.00         ?? 3. Essential hypertension ????I10 ????401.9         ?? 4. Hyperlipidemia, unspecified hyperlipidemia type ????E78.5 ????272.4         ?? 5. Other depression ????F32.89 ????311         ?? 6. Acute maxillary sinusitis, recurrence not specified ????J01.00 ????461.0      Ketorolac 30 mg IM  Doxycycline 100 mg - Take 1 capsule twice daily with food for 10 days, remain upright for 45 minutes after each dose.  Continue current medications.  Further disposition pending lab results if indicated.  Return for follow up in 6 months, sooner with any problems.

## 2016-06-30 NOTE — Progress Notes (Signed)
Please advise that lipid levels are in reasonable control on current medication which should be continued.

## 2016-06-30 NOTE — Patient Instructions (Addendum)
Further disposition pending lab results if indicated.  Continue current medications.  Return for follow up in 6 months, sooner with any problems.  Schedule diabetic eye exam  Doxycycline 100 mg - Take 1 capsule twice daily with food for 10 days, remain upright for 45 minutes after each dose.

## 2016-07-01 ENCOUNTER — Encounter: Attending: Family Medicine | Primary: Legal Medicine

## 2016-07-01 ENCOUNTER — Inpatient Hospital Stay: Admit: 2016-07-01 | Payer: MEDICARE | Primary: Legal Medicine

## 2016-07-01 LAB — LIPID PANEL
CHOL/HDL Ratio: 3.2 (ref 0–5.0)
Cholesterol, total: 86 MG/DL (ref <200)
HDL Cholesterol: 27 MG/DL — ABNORMAL LOW (ref 40–60)
LDL, calculated: 14.8 MG/DL (ref 0–100)
Triglyceride: 221 MG/DL — ABNORMAL HIGH (ref <150)
VLDL, calculated: 44.2 MG/DL

## 2016-07-01 LAB — URINALYSIS W/ RFLX MICROSCOPIC
Bilirubin: NEGATIVE
Blood: NEGATIVE
Glucose: NEGATIVE mg/dL
Ketone: NEGATIVE mg/dL
Leukocyte Esterase: NEGATIVE
Nitrites: NEGATIVE
Protein: NEGATIVE mg/dL
Specific gravity: 1.017 (ref 1.005–1.030)
Urobilinogen: 0.2 EU/dL (ref 0.2–1.0)
pH (UA): 6.5 (ref 5.0–8.0)

## 2016-07-01 LAB — HEMOGLOBIN A1C WITH EAG
Est. average glucose: 114 mg/dL
Hemoglobin A1c: 5.6 % (ref 4.2–5.6)

## 2016-07-01 LAB — TSH 3RD GENERATION: TSH: 1.86 u[IU]/mL (ref 0.36–3.74)

## 2016-07-05 ENCOUNTER — Encounter: Attending: Medical Oncology | Primary: Legal Medicine

## 2016-07-05 NOTE — Telephone Encounter (Signed)
Please ask the pharmacist to Discontinue order for nortriptyline and advise the patient that even though he has taken citalopram and nortriptyline together in the past, it is not safe to take them together and the pharmacy is not willing to dispense both. He can continue to take the citalopram but I can't prescribe the nortriptyline.

## 2016-07-05 NOTE — Telephone Encounter (Signed)
Pt aware

## 2016-07-05 NOTE — Telephone Encounter (Signed)
Pt's wife called stating today the pt noticed a rash on the upper parts of both legs, itchy. (had the same reaction years ago to penicillin).  Pt thinks it was due to the doxycycline. Dr. Harley Alto was made aware. Per Dr. Harley Alto he stated the pt should stop the medication. Pt was instructed to take Benadryl if symptoms persist. Pt's wife stated she understood

## 2016-07-12 ENCOUNTER — Encounter: Attending: Medical Oncology | Primary: Legal Medicine

## 2016-07-15 ENCOUNTER — Encounter: Attending: Medical Oncology | Primary: Legal Medicine

## 2016-07-19 ENCOUNTER — Inpatient Hospital Stay: Admit: 2016-07-19 | Payer: MEDICARE | Primary: Legal Medicine

## 2016-07-19 ENCOUNTER — Ambulatory Visit
Admit: 2016-07-19 | Discharge: 2016-07-19 | Payer: PRIVATE HEALTH INSURANCE | Attending: Medical Oncology | Primary: Legal Medicine

## 2016-07-19 DIAGNOSIS — D500 Iron deficiency anemia secondary to blood loss (chronic): Secondary | ICD-10-CM

## 2016-07-19 DIAGNOSIS — D509 Iron deficiency anemia, unspecified: Secondary | ICD-10-CM

## 2016-07-19 LAB — POC CHEM8
Anion gap, POC: 29 — ABNORMAL HIGH (ref 10–20)
BUN, POC: 5 MG/DL — ABNORMAL LOW (ref 7–18)
CO2, POC: 23 MMOL/L (ref 19–24)
Calcium, ionized (POC): 1.25 MMOL/L (ref 1.12–1.32)
Chloride, POC: 93 MMOL/L — ABNORMAL LOW (ref 100–108)
Creatinine, POC: 1.3 MG/DL (ref 0.6–1.3)
GFRAA, POC: >60 mL/min/{1.73_m2} (ref >60)
GFRNA, POC: 55 mL/min/{1.73_m2} — ABNORMAL LOW (ref >60)
Glucose, POC: 97 MG/DL (ref 74–106)
Hematocrit, POC: 33 % — ABNORMAL LOW (ref 36–49)
Hemoglobin, POC: 11.2 G/DL — ABNORMAL LOW (ref 12–16)
Potassium, POC: 4.9 MMOL/L (ref 3.5–5.5)
Sodium, POC: 139 MMOL/L (ref 136–145)

## 2016-07-19 LAB — CBC WITH 3 PART DIFF
ABS. LYMPHOCYTES: 0.7 10*3/uL — ABNORMAL LOW (ref 1.1–5.9)
ABS. MIXED CELLS: 0.5 10*3/uL (ref 0.0–2.3)
ABS. NEUTROPHILS: 3.4 10*3/uL (ref 1.8–9.5)
HCT: 34 % — ABNORMAL LOW (ref 36–48)
HGB: 10.7 g/dL — ABNORMAL LOW (ref 12.0–16.0)
LYMPHOCYTES: 16 % (ref 14–44)
MCH: 30.2 PG (ref 25.0–35.0)
MCHC: 31.5 g/dL (ref 31–37)
MCV: 96 FL (ref 78–102)
Mixed cells: 10 % (ref 0.1–17)
NEUTROPHILS: 74 % — ABNORMAL HIGH (ref 40–70)
PLATELET: 101 10*3/uL — ABNORMAL LOW (ref 140–440)
RBC: 3.54 M/uL — ABNORMAL LOW (ref 4.10–5.10)
RDW: 12.9 % (ref 11.5–14.5)
WBC: 4.6 10*3/uL (ref 4.5–13.0)

## 2016-07-19 LAB — HEPATIC FUNCTION PANEL
A-G Ratio: 1.1 (ref 0.8–1.7)
ALT (SGPT): 26 U/L (ref 16–61)
AST (SGOT): 29 U/L (ref 15–37)
Albumin: 4.1 g/dL (ref 3.4–5.0)
Alk. phosphatase: 167 U/L — ABNORMAL HIGH (ref 45–117)
Bilirubin, direct: 0.2 MG/DL (ref 0.0–0.2)
Bilirubin, total: 0.5 MG/DL (ref 0.2–1.0)
Globulin: 3.6 g/dL (ref 2.0–4.0)
Protein, total: 7.7 g/dL (ref 6.4–8.2)

## 2016-07-19 MED ORDER — SODIUM CHLORIDE 0.9 % IV
Freq: Once | INTRAVENOUS | Status: AC
Start: 2016-07-19 — End: 2016-07-19
  Administered 2016-07-19: 19:00:00 via INTRAVENOUS

## 2016-07-19 MED FILL — SODIUM CHLORIDE 0.9 % IV: INTRAVENOUS | Qty: 1000

## 2016-07-19 NOTE — Progress Notes (Signed)
[facility]:     Patient: William YOUNGBLUT Sr. Age: 71 y.o. Sex: male    Date of Birth: 06-02-1946 Admit Date: (Not on file) PCP: Lenice Pressman, MD   MRN: [id number]  CSN: [id number]       Patient Active Problem List   Diagnosis Code   ??? Chronic anemia D64.9   ??? Controlled type 2 diabetes mellitus without complication, without long-term current use of insulin ([place]) E11.9   ??? Essential hypertension I10   ??? Hyperlipidemia E78.5   ??? History of stroke Z86.73   ??? Anxiety F41.9   ??? Chronic pain G89.29   ??? Iron deficiency anemia D50.9   ??? 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 Other cirrhosis of liver (HCC) K74.69   ??? Depression F32.9     Assessment/ Plan:     1.)  Iron Deficiency Anemia-IDA  -Secondary to chronic GI blood loss  -History of poor iron absorption  -Requires frequent IV iron replacement  -s/p replacement with IV iron with Injectafer October/November 2017  -Cont. IV iron replacement every 2-3 months.  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 instructed to go to ER if symptoms persist or worsen    I have discussed the diagnosis with the patient and the intended plan as seen in the above orders. I have reviewed the plan of care with the patient, accepted their input and they are in agreement with the treatment goals. Patient has provided input and agrees with goals.    History:   William LUDEN Sr. is a 71 y.o. male presenting today for:      Iron Deficiency Anemia-IDA  -Patient with history of poor iron absorption  -Patient requiring frequent IV iron replacement    Subjective:  Complaining of generalized fatigue and weakness.    He denies fever, chills, sweats, chest pain, hemoptysis, hematochezia,  melena, hematuria, dysuria, paresthesias, and or loss of consciousness.    C/o mild dizziness with ambulation    Current Outpatient Prescriptions   Medication Sig Dispense Refill   ??? topiramate (TOPAMAX) 100 mg tablet Take 1 Tab by mouth daily. 90 Tab 3   ??? gemfibrozil (LOPID) 600 mg tablet TAKE 1 TABLET BY MOUTH TWICE DAILY 180 Tab 3   ??? prochlorperazine (COMPAZINE) 10 mg tablet Take 0.5 Tabs by mouth every six (6) hours as needed. 30 Tab 5   ??? citalopram (CELEXA) 40 mg tablet Take 1 Tab by mouth daily. 90 Tab 3   ??? B INFANTIS/B ANI/B LON/B BIFID (PROBIOTIC 4X PO) Take  by mouth.     ??? cholecalciferol, vitamin D3, (VITAMIN D3) 2,000 unit tab Take  by mouth.     ??? aspirin delayed-release 81 mg tablet Take  by mouth daily.     ??? propranolol (INDERAL) 20 mg tablet Take  by mouth three (3) times daily.     ??? metFORMIN (GLUCOPHAGE) 500 mg tablet Take  by mouth two (2) times daily (with meals).     ??? lactulose (CHRONULAC) 10 gram/15 mL solution Take  by mouth three (3) times daily.     ??? codeine-butalbital-aspirin-caffeine (BUTALBITAL COMPOUND-CODEINE) 30-50-325-40 mg per capsule Take  by  mouth every six (6) hours as needed for Pain.     ??? sucralfate (CARAFATE) 1 gram tablet Take 1 g by mouth four (4) times daily.     ??? nortriptyline (PAMELOR) 50 mg capsule Take 50 mg by mouth nightly.     ??? folic acid (FOLVITE) 1 mg tablet Take  by mouth daily.     ??? oxyCODONE ER (OXYCONTIN) 20 mg ER tablet Take 20 mg by mouth every twelve (12) hours.     ??? tamsulosin (FLOMAX) 0.4 mg capsule Take 0.4 mg by mouth daily.     ??? cyclobenzaprine (FLEXERIL) 10 mg tablet Take  by mouth three (3) times daily as needed for Muscle Spasm(s).     ??? esomeprazole (NEXIUM) 20 mg capsule Take  by mouth daily.     ??? multivitamin, tx-iron-ca-min (THERA-M W/ IRON) 9 mg iron-400 mcg tab tablet Take 1 Tab by mouth daily.         Objective:   VS:    Vitals:    07/19/16 1316 07/19/16 1321   BP: (!) 85/40 (!) 80/40   Pulse: (!) 54    Resp: 17     Temp: 97.6 ??F (36.4 ??C)    TempSrc: Oral    SpO2: 100%    Weight: 62.6 kg (138 lb)    Height: 5\' 9"  (1.753 m)    ??  Physical Exam:  ??  Constitutional:?? no acute distress  alert and oriented x 3  pale appearing   HENT:?? atraumatic   Eyes:?? EOMI   Neck:?? No mass   Cardiovascular:??  S1, S2   Pulmonary/Chest Wall:?? clear   Abdominal:?? Non tender, soft  + BS       Musculoskeletal:?? No deformity   Skin:?? No rash   Peripheral Vascular:?? Pulses palpable       Review of Systems:  The remainder of 12 point ROS was otherwise negative except for what was reported in the CC/HPI:      Recent Results (from the past 168 hour(s))   CBC WITH 3 PART DIFF    Collection Time: 07/19/16  1:45 PM   Result Value Ref Range    WBC 4.6 4.5 - 13.0 K/uL    RBC 3.54 (L) 4.10 - 5.10 M/uL    HGB 10.7 (L) 12.0 - 16.0 g/dL    HCT 34.0 (L) 36 - 48 %    MCV 96.0 78 - 102 FL    MCH 30.2 25.0 - 35.0 PG    MCHC 31.5 31 - 37 g/dL    RDW 12.9 11.5 - 14.5 %    PLATELET 101 (L) 140 - 440 K/uL    NEUTROPHILS 74 (H) 40 - 70 %    MIXED CELLS 10 0.1 - 17 %    LYMPHOCYTES 16 14 - 44 %    ABS. NEUTROPHILS 3.4 1.8 - 9.5 K/UL    ABS. MIXED CELLS 0.5 0.0 - 2.3 K/uL    ABS. LYMPHOCYTES 0.7 (L) 1.1 - 5.9 K/UL    DF AUTOMATED     POC CHEM8    Collection Time: 07/19/16  1:50 PM   Result Value Ref Range    CO2, POC 23 19 - 24 MMOL/L    Glucose, POC 97 74 - 106 MG/DL    BUN, POC 5 (L) 7 - 18 MG/DL    Creatinine, POC 1.3 0.6 - 1.3 MG/DL    GFRAA, POC >60 >60 ml/min/1.63m2    GFRNA, POC 55 (L) >60 ml/min/1.5m2  Sodium, POC 139 136 - 145 MMOL/L    Potassium, POC 4.9 3.5 - 5.5 MMOL/L    Calcium, ionized (POC) 1.25 1.12 - 1.32 MMOL/L    Chloride, POC 93 (L) 100 - 108 MMOL/L    Anion gap, POC 29 (H) 10 - 20      Hematocrit, POC 33 (L) 36 - 49 %    Hemoglobin, POC 11.2 (L) 12 - 16 G/DL       Past Medical History:   Diagnosis Date   ??? Anxiety    ??? Chronic pain    ??? Cirrhosis of liver ([place])    ??? Diabetes ([place])    ??? GERD (gastroesophageal reflux disease)    ??? Hyperlipemia     ??? Hypertension    ??? Migraine    ??? Stroke [facility])     had 3 strokes        Past Surgical History:   Procedure Laterality Date   ??? HX CHOLECYSTECTOMY     ??? HX ORTHOPAEDIC      R hand sx       Social History     Social History   ??? Marital status: MARRIED     Spouse name: N/A   ??? Number of children: N/A   ??? Years of education: N/A     Occupational History   ??? Not on file.     Social History Main Topics   ??? Smoking status: Former Smoker   ??? Smokeless tobacco: Former Systems developer      Comment: quit years ago   ??? Alcohol use No   ??? Drug use: Not on file   ??? Sexual activity: Yes     Partners: Male     Other Topics Concern   ??? Not on file     Social History Narrative       Family History   Problem Relation Age of Onset   ??? Cancer Mother      colon cancer    ??? Cancer Father      prostate, lung cancer    ??? Heart Disease Father    ??? Diabetes Sister    ??? Heart Disease Sister    ??? Diabetes Brother    ??? Hypertension Neg Hx        Allergies   Allergen Reactions   ??? Doxycycline Hives   ??? Gabapentin Other (comments)     Per pt became violent    ??? Lyrica [Pregabalin] Other (comments)     Per pt stroke symptoms   ??? Penicillins Hives       Follow-up Disposition: Not on File    Carylon Tamburro B. Mercie Eon, MD, MPH Hematology-Medical Oncology  July 19, 2016 2:39 PM

## 2016-07-19 NOTE — Progress Notes (Signed)
[facility] Progress Note    Date: July 19, 2016    Name: William TREADWELL Sr.    MRN: [id number]         DOB: [birth year]    William Blanchard arrived to [facility] accompanied by wife.    William Blanchard was assessed and education was provided.     William Blanchard vitals were reviewed and patient was observed for 5 minutes prior to treatment.   Patient Vitals for the past 12 hrs:   Temp Pulse Resp BP SpO2   07/19/16 1505 - 63 - 106/58 -   07/19/16 1345 97.6 ??F (36.4 ??C) (!) 54 17 (!) 85/40 100 %         Lab results were obtained and reviewed.  Recent Results (from the past 12 hour(s))   CBC WITH 3 PART DIFF    Collection Time: 07/19/16  1:45 PM   Result Value Ref Range    WBC 4.6 4.5 - 13.0 K/uL    RBC 3.54 (L) 4.10 - 5.10 M/uL    HGB 10.7 (L) 12.0 - 16.0 g/dL    HCT 34.0 (L) 36 - 48 %    MCV 96.0 78 - 102 FL    MCH 30.2 25.0 - 35.0 PG    MCHC 31.5 31 - 37 g/dL    RDW 12.9 11.5 - 14.5 %    PLATELET 101 (L) 140 - 440 K/uL    NEUTROPHILS 74 (H) 40 - 70 %    MIXED CELLS 10 0.1 - 17 %    LYMPHOCYTES 16 14 - 44 %    ABS. NEUTROPHILS 3.4 1.8 - 9.5 K/UL    ABS. MIXED CELLS 0.5 0.0 - 2.3 K/uL    ABS. LYMPHOCYTES 0.7 (L) 1.1 - 5.9 K/UL    DF AUTOMATED     POC CHEM8    Collection Time: 07/19/16  1:50 PM   Result Value Ref Range    CO2, POC 23 19 - 24 MMOL/L    Glucose, POC 97 74 - 106 MG/DL    BUN, POC 5 (L) 7 - 18 MG/DL    Creatinine, POC 1.3 0.6 - 1.3 MG/DL    GFRAA, POC >60 >60 ml/min/1.65m2    GFRNA, POC 55 (L) >60 ml/min/1.79m2    Sodium, POC 139 136 - 145 MMOL/L    Potassium, POC 4.9 3.5 - 5.5 MMOL/L    Calcium, ionized (POC) 1.25 1.12 - 1.32 MMOL/L    Chloride, POC 93 (L) 100 - 108 MMOL/L    Anion gap, POC 29 (H) 10 - 20      Hematocrit, POC 33 (L) 36 - 49 %    Hemoglobin, POC 11.2 (L) 12 - 16 G/DL       Saline lock started to right arm using 22g catheter x1 attempt. Specimen collected for cbc w/diff, bmp, hepatic function.      Normal saline 1L was infused over 1 hour.     After infusion complete line flushed with 31ml ns then removed. Gauze and coban applied to site. Dr Mercie Eon notified of blood pressure after infusion, no new orders.     William Blanchard tolerated the infusion, and had no complaints.  Patient armband removed and shredded.    William Blanchard was discharged from [facility] in stable condition at 1510. He is to with physician for next for his next appointment.    [name], RN  July 19, 2016  3:48 PM

## 2016-07-23 ENCOUNTER — Other Ambulatory Visit: Payer: Self-pay | Admitting: Gastroenterology

## 2016-08-02 ENCOUNTER — Ambulatory Visit
Admit: 2016-08-02 | Discharge: 2016-08-02 | Payer: PRIVATE HEALTH INSURANCE | Attending: Legal Medicine | Primary: Legal Medicine

## 2016-08-02 DIAGNOSIS — K7460 Unspecified cirrhosis of liver: Secondary | ICD-10-CM

## 2016-08-02 MED ORDER — TRIAMCINOLONE ACETONIDE 0.1 % TOPICAL CREAM
0.1 % | Freq: Two times a day (BID) | CUTANEOUS | 0 refills | Status: DC
Start: 2016-08-02 — End: 2016-10-22

## 2016-08-02 NOTE — Progress Notes (Signed)
William Blanchard     Chief Complaint   Patient presents with   ??? New Patient     Vitals:    08/02/16 1312   BP: (!) 80/60   Pulse: 65   Resp: 16   Temp: 98.7 ??F (37.1 ??C)   TempSrc: Oral   SpO2: 98%   Weight: 140 lb (63.5 kg)   Height: 5\' 9"  (1.753 m)   PainSc:   5         HPI: 71 years old male he is here to establish care and change PCP, he attended the clinic with his wife.  He is slightly hard of hearing.  Transferring from [facility] where he has been for the last 7 years.    He has multiple medical problems    #1.  Liver cirrhosis secondary to fatty liver        #2.  Type 2 diabetes he is on metformin     #3.Has IDA due to chronic GI blood lossloss he has been seening oncology for Iron transfusion     #4 chronic pian   #5 anxiety and depression   #6 H/O stroke and left sided weakness         Past Medical History:   Diagnosis Date   ??? Anemia    ??? Anxiety    ??? Hypotension    ??? Other cirrhosis of liver ([place])      Past Surgical History:   Procedure Laterality Date   ??? HX CHOLECYSTECTOMY     ??? HX OTHER SURGICAL      Hand surgery- Nail gun went through finger     Social History   Substance Use Topics   ??? Smoking status: Former Smoker   ??? Smokeless tobacco: Never Used   ??? Alcohol use No       Family History   Problem Relation Age of Onset   ??? Alzheimer Mother    ??? Diabetes Mother    ??? Cancer Mother    ??? Hypertension Father    ??? Heart Disease Father    ??? Cancer Father        Review of Systems   Constitutional: Negative for chills, fever, malaise/fatigue and weight loss.   HENT: Negative for congestion, ear discharge, ear pain, hearing loss, nosebleeds and sore throat.    Eyes: Negative for blurred vision, double vision and discharge.   Respiratory: Negative for cough, hemoptysis, shortness of breath and wheezing.    Cardiovascular: Negative for chest pain, palpitations, claudication and leg swelling.   Gastrointestinal: Negative for abdominal pain, constipation, diarrhea, nausea and vomiting.    Genitourinary: Negative for dysuria, frequency and urgency.   Musculoskeletal: Positive for back pain. Negative for myalgias.   Skin: Positive for itching and rash.   Neurological: Positive for focal weakness. Negative for dizziness, tingling, sensory change, speech change, weakness and headaches.   Psychiatric/Behavioral: Negative for depression and suicidal ideas. The patient is nervous/anxious.        Physical Exam   Constitutional: He is oriented to person, place, and time. He appears well-developed and well-nourished. No distress.   Slightly hard of hearing   HENT:   Head: Normocephalic and atraumatic.   Mouth/Throat: No oropharyngeal exudate.   Eyes: Conjunctivae are normal. Pupils are equal, round, and reactive to light. Right eye exhibits no discharge. Left eye exhibits no discharge. No scleral icterus.   Neck: Normal range of motion. Neck supple. No thyromegaly present.   Cardiovascular: Normal rate, regular rhythm  and normal heart sounds.    Pulmonary/Chest: Effort normal and breath sounds normal. No respiratory distress. He has no rales.   Abdominal: Soft. Bowel sounds are normal. There is no tenderness. There is no rebound.   Musculoskeletal: Normal range of motion. He exhibits no edema, tenderness or deformity.   Lymphadenopathy:     He has no cervical adenopathy.   Neurological: He is alert and oriented to person, place, and time. No cranial nerve deficit. Coordination normal.   Skin: Skin is warm and dry. Rash noted. He is not diaphoretic. There is erythema.   Patient has multiple erythematous papules on his torso on his extremities   Psychiatric: He has a normal mood and affect. Judgment and thought content normal.   Nursing note and vitals reviewed.       Assessment and plan     1. Cirrhosis of liver without ascites, unspecified hepatic cirrhosis type (HCC)  Last lipid panel AST ALT were normal on the alkaline phosphatase was elevated     2. Type 2 diabetes mellitus with complication, without long-term current use of insulin (HCC)  He is on metformin 500 mg according his records last hemoglobin A1c was 5.6  3. Mixed hyperlipidemia  He is taking a statin rosuvastatin and gemfibrozil  4. Depression, unspecified depression type      5. Gastroesophageal reflux disease without esophagitis    He is on Nexium and that is controlling the symptoms  6. Rash/skin eruption    Nonspecific skin eruption, I am not sure if it is a reaction or allergic.  Wife said that he notes she noticed a skin rash after he was taken off of 1 of his pain medications!!!  We will try a topical steroid and follow-up    - triamcinolone acetonide (KENALOG) 0.1 % topical cream; Apply  to affected area two (2) times a day. use thin layer  Dispense: 453.6 g; Refill: 0    Current Outpatient Prescriptions   Medication Sig Dispense Refill   ??? cyclobenzaprine (FLEXERIL) 10 mg tablet Take  by mouth three (3) times daily as needed for Muscle Spasm(s).     ??? esomeprazole (NEXIUM) 20 mg capsule Take  by mouth daily.     ??? sucralfate (CARAFATE) 1 gram tablet Take 1 g by mouth four (4) times daily.     ??? metFORMIN (GLUCOPHAGE) 500 mg tablet Take 1,000 mg by mouth two (2) times daily (with meals).     ??? LORazepam (ATIVAN) 1 mg tablet Take  by mouth every four (4) hours as needed for Anxiety.     ??? prochlorperazine (COMPAZINE) 10 mg tablet Take 5 mg by mouth every six (6) hours as needed.     ??? aspirin 81 mg chewable tablet Take 81 mg by mouth daily.     ??? citalopram (CELEXA) 40 mg tablet Take 40 mg by mouth daily.     ??? gemfibrozil (LOPID) 600 mg tablet Take 600 mg by mouth two (2) times a day.     ??? codeine-butalbital-acetaminophen-caffeine (FIORICET WITH CODEINE) 50-325-40-30 mg capsule Take  by mouth.     ??? MULTIVIT-MIN/FA/LYCOPEN/LUTEIN (CENTRUM SILVER MEN PO) Take  by mouth.     ??? oxyCODONE IR (ROXICODONE) 20 mg immediate release tablet Take  by mouth  every four (4) hours as needed for Pain.     ??? tamsulosin (FLOMAX) 0.4 mg capsule Take 0.4 mg by mouth daily.     ??? propranolol (INDERAL) 20 mg tablet Take  by mouth three (  3) times daily.     ??? topiramate (TOPAMAX) 100 mg tablet Take  by mouth two (2) times a day.     ??? rosuvastatin (CRESTOR) 10 mg tablet Take 10 mg by mouth nightly.     ??? cholecalciferol, vitamin D3, (VITAMIN D3) 2,000 unit tab Take  by mouth.     ??? B.infantis-B.ani-B.long-B.bifi (PROBIOTIC 4X) 10-15 mg TbEC Take  by mouth.     ??? triamcinolone acetonide (KENALOG) 0.1 % topical cream Apply  to affected area two (2) times a day. use thin layer 453.6 g 0       There are no active problems to display for this patient.    No results found for this or any previous visit.  No results found for any previous visit.       Follow-up Disposition:  Return in about 1 month (around 08/30/2016).

## 2016-08-02 NOTE — Progress Notes (Addendum)
William Blalock Wieman Sr.     Chief Complaint   Patient presents with   ??? New Patient     Vitals:    08/02/16 1312   BP: (!) 80/60   Pulse: 65   Resp: 16   Temp: 98.7 ??F (37.1 ??C)   TempSrc: Oral   SpO2: 98%   Weight: 140 lb (63.5 kg)   Height: '5\' 9"'$  (1.753 m)   PainSc:   5         HPI: William Blanchard is here to establish care, he coming with his wife, they used to follow-up with Dr.Jeur and he would like to start following up here,.    He is hard of hearing.    He has a history of diabetes without using insulin    Chronic liver cirrhosis    Hyperlipidemia    Depression he has been recently weaned out from his nortriptyline by Dr. Marga Melnick .    Today he has been complaining about itchy skin rash for a few days all over his body and the wife is related to that to stopping the nortriptyline.  The rashes on his arms and his back and it itching with new lesions.  Itching had improved follow-up on his wife give him a steroid cream to put on.  Past Medical History:   Diagnosis Date   ??? Anemia    ??? Anxiety    ??? Chronic pain    ??? Cirrhosis of liver ([place])    ??? Diabetes ([place])    ??? GERD (gastroesophageal reflux disease)    ??? Hyperlipemia    ??? Hypertension    ??? Hypotension    ??? Migraine    ??? Other cirrhosis of liver ([place])    ??? Stroke [facility])     had 3 strokes      Past Surgical History:   Procedure Laterality Date   ??? HX CHOLECYSTECTOMY     ??? HX ORTHOPAEDIC      R hand sx   ??? HX OTHER SURGICAL      Hand surgery- Nail gun went through finger     Social History   Substance Use Topics   ??? Smoking status: Former Smoker   ??? Smokeless tobacco: Never Used      Comment: quit years ago   ??? Alcohol use No       Family History   Problem Relation Age of Onset   ??? Alzheimer Mother    ??? Diabetes Mother    ??? Cancer Mother      colon cancer    ??? Hypertension Father    ??? Heart Disease Father    ??? Cancer Father      prostate, lung cancer    ??? Diabetes Sister    ??? Heart Disease Sister    ??? Diabetes Brother        Review of Systems    Constitutional: Negative for chills, fever, malaise/fatigue and weight loss.   HENT: Positive for hearing loss. Negative for congestion, ear discharge, ear pain, nosebleeds and sore throat.    Eyes: Negative for blurred vision, double vision and discharge.   Respiratory: Negative for cough, sputum production and shortness of breath.    Cardiovascular: Negative for chest pain, palpitations, claudication and leg swelling.   Gastrointestinal: Negative for abdominal pain, constipation, diarrhea, nausea and vomiting.   Genitourinary: Negative for dysuria, flank pain, frequency, hematuria and urgency.   Musculoskeletal: Positive for back pain. Negative for myalgias.  Chronic back pain   Skin: Negative for itching and rash.   Neurological: Negative for dizziness, tingling, sensory change, speech change, focal weakness, loss of consciousness, weakness and headaches.   Psychiatric/Behavioral: Positive for depression. Negative for substance abuse and suicidal ideas. The patient is not nervous/anxious and does not have insomnia.        Physical Exam   Constitutional: He is oriented to person, place, and time. He appears well-developed and well-nourished. No distress.   HENT:   Head: Normocephalic and atraumatic.   Mouth/Throat: No oropharyngeal exudate.   Eyes: Conjunctivae are normal. Pupils are equal, round, and reactive to light. Right eye exhibits no discharge. Left eye exhibits no discharge. No scleral icterus.   Neck: Normal range of motion. Neck supple. No thyromegaly present.   Cardiovascular: Normal rate, regular rhythm and normal heart sounds.    Pulmonary/Chest: Effort normal and breath sounds normal. No respiratory distress. He has no rales.   Abdominal: Soft. Bowel sounds are normal. He exhibits no mass. There is no tenderness. There is no rebound.   Musculoskeletal: Normal range of motion. He exhibits no edema or tenderness.   Lymphadenopathy:     He has no cervical adenopathy.    Neurological: He is alert and oriented to person, place, and time. No cranial nerve deficit. Coordination normal.   Skin: Skin is warm and dry. No rash noted. He is not diaphoretic. No erythema.   Psychiatric: He has a normal mood and affect. Judgment and thought content normal.        Assessment and plan     1. Cirrhosis of liver without ascites, unspecified hepatic cirrhosis type (HCC)  Chronic liver cirrhosis, stable, to continue to monitor liver function tests periodically,  Loss of function liver function test was done in January AST ALT were normal  2. Type 2 diabetes mellitus with complication, without long-term current use of insulin (HCC)  Well-controlled on metformin 500 mg tablet    3. Mixed hyperlipidemia    Continue Crestor 10 mg  4. Depression, unspecified depression type  He has been on to taper off for his nortriptyline and to continue the citalopram.    5. Gastroesophageal reflux disease without esophagitis  He is on Nexium 20 mg daily is controlling his symptoms    6. Rash/skin eruption    - triamcinolone acetonide (KENALOG) 0.1 % topical cream; Apply  to affected area two (2) times a day. use thin layer  Dispense: 453.6 g; Refill: 0    Current Outpatient Prescriptions   Medication Sig Dispense Refill   ??? cyclobenzaprine (FLEXERIL) 10 mg tablet Take  by mouth three (3) times daily as needed for Muscle Spasm(s).     ??? esomeprazole (NEXIUM) 20 mg capsule Take  by mouth daily.     ??? sucralfate (CARAFATE) 1 gram tablet Take 1 g by mouth four (4) times daily.     ??? LORazepam (ATIVAN) 1 mg tablet Take  by mouth every four (4) hours as needed for Anxiety.     ??? prochlorperazine (COMPAZINE) 10 mg tablet Take 5 mg by mouth every six (6) hours as needed.     ??? aspirin 81 mg chewable tablet Take 81 mg by mouth daily.     ??? citalopram (CELEXA) 40 mg tablet Take 40 mg by mouth daily.     ??? gemfibrozil (LOPID) 600 mg tablet Take 600 mg by mouth two (2) times a day.      ??? codeine-butalbital-acetaminophen-caffeine (FIORICET WITH CODEINE) 50-325-40-30 mg  capsule Take  by mouth.     ??? MULTIVIT-MIN/FA/LYCOPEN/LUTEIN (CENTRUM SILVER MEN PO) Take  by mouth.     ??? oxyCODONE IR (ROXICODONE) 20 mg immediate release tablet Take  by mouth every four (4) hours as needed for Pain.     ??? tamsulosin (FLOMAX) 0.4 mg capsule Take 0.4 mg by mouth daily.     ??? propranolol (INDERAL) 20 mg tablet Take  by mouth three (3) times daily.     ??? topiramate (TOPAMAX) 100 mg tablet Take  by mouth two (2) times a day.     ??? rosuvastatin (CRESTOR) 10 mg tablet Take 10 mg by mouth nightly.     ??? cholecalciferol, vitamin D3, (VITAMIN D3) 2,000 unit tab Take  by mouth.     ??? B.infantis-B.ani-B.long-B.bifi (PROBIOTIC 4X) 10-15 mg TbEC Take  by mouth.     ??? triamcinolone acetonide (KENALOG) 0.1 % topical cream Apply  to affected area two (2) times a day. use thin layer 453.6 g 0   ??? metFORMIN (GLUCOPHAGE) 500 mg tablet Take 2 Tabs by mouth two (2) times daily (with meals). 360 Tab 0   ??? topiramate (TOPAMAX) 100 mg tablet Take 1 Tab by mouth daily. 90 Tab 3   ??? gemfibrozil (LOPID) 600 mg tablet TAKE 1 TABLET BY MOUTH TWICE DAILY 180 Tab 3   ??? prochlorperazine (COMPAZINE) 10 mg tablet Take 0.5 Tabs by mouth every six (6) hours as needed. 30 Tab 5   ??? citalopram (CELEXA) 40 mg tablet Take 1 Tab by mouth daily. 90 Tab 3   ??? B INFANTIS/B ANI/B LON/B BIFID (PROBIOTIC 4X PO) Take  by mouth.     ??? cholecalciferol, vitamin D3, (VITAMIN D3) 2,000 unit tab Take  by mouth.     ??? aspirin delayed-release 81 mg tablet Take  by mouth daily.     ??? propranolol (INDERAL) 20 mg tablet Take  by mouth three (3) times daily.     ??? metFORMIN (GLUCOPHAGE) 500 mg tablet Take  by mouth two (2) times daily (with meals).     ??? lactulose (CHRONULAC) 10 gram/15 mL solution Take  by mouth three (3) times daily.     ??? codeine-butalbital-aspirin-caffeine (BUTALBITAL COMPOUND-CODEINE)  30-50-325-40 mg per capsule Take  by mouth every six (6) hours as needed for Pain.     ??? sucralfate (CARAFATE) 1 gram tablet Take 1 g by mouth four (4) times daily.     ??? folic acid (FOLVITE) 1 mg tablet Take  by mouth daily.     ??? oxyCODONE ER (OXYCONTIN) 20 mg ER tablet Take 20 mg by mouth every twelve (12) hours.     ??? tamsulosin (FLOMAX) 0.4 mg capsule Take 0.4 mg by mouth daily.     ??? cyclobenzaprine (FLEXERIL) 10 mg tablet Take  by mouth three (3) times daily as needed for Muscle Spasm(s).     ??? esomeprazole (NEXIUM) 20 mg capsule Take  by mouth daily.     ??? multivitamin, tx-iron-ca-min (THERA-M W/ IRON) 9 mg iron-400 mcg tab tablet Take 1 Tab by mouth daily.         Patient Active Problem List    Diagnosis Date Noted   ??? Type II diabetes mellitus ([place]) 08/03/2016   ??? HTN (hypertension) 08/03/2016   ??? 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 Stroke ([place]) 08/03/2016   ??? Compressed vertebrae ([place]) 08/03/2016   ??? Chronic back pain greater than 3 months duration 08/03/2016   ??? Hyperlipemia 08/03/2016   ???  Anxiety 08/03/2016   ??? Depression 06/30/2016   ??? Iron deficiency anemia 04/22/2016   ??? NAFLD (nonalcoholic fatty liver disease) 04/22/2016   ??? Other cirrhosis of liver ([place]) 04/22/2016   ??? Chronic anemia 03/18/2016   ??? Controlled type 2 diabetes mellitus without complication, without long-term current use of insulin ([place]) 03/18/2016   ??? Essential hypertension 03/18/2016   ??? Hyperlipidemia 03/18/2016   ??? History of stroke 03/18/2016   ??? Anxiety 03/18/2016   ??? Chronic pain 03/18/2016     Results for orders placed or performed during the hospital encounter of 07/19/16   CBC WITH 3 PART DIFF   Result Value Ref Range    WBC 4.6 4.5 - 13.0 K/uL    RBC 3.54 (L) 4.10 - 5.10 M/uL    HGB 10.7 (L) 12.0 - 16.0 g/dL    HCT 34.0 (L) 36 - 48 %    MCV 96.0 78 - 102 FL    MCH 30.2 25.0 - 35.0 PG    MCHC 31.5 31 - 37 g/dL    RDW 12.9 11.5 - 14.5 %    PLATELET 101 (L) 140 - 440 K/uL    NEUTROPHILS 74 (H) 40 - 70 %     MIXED CELLS 10 0.1 - 17 %    LYMPHOCYTES 16 14 - 44 %    ABS. NEUTROPHILS 3.4 1.8 - 9.5 K/UL    ABS. MIXED CELLS 0.5 0.0 - 2.3 K/uL    ABS. LYMPHOCYTES 0.7 (L) 1.1 - 5.9 K/UL    DF AUTOMATED     HEPATIC FUNCTION PANEL   Result Value Ref Range    Protein, total 7.7 6.4 - 8.2 g/dL    Albumin 4.1 3.4 - 5.0 g/dL    Globulin 3.6 2.0 - 4.0 g/dL    A-G Ratio 1.1 0.8 - 1.7      Bilirubin, total 0.5 0.2 - 1.0 MG/DL    Bilirubin, direct 0.2 0.0 - 0.2 MG/DL    Alk. phosphatase 167 (H) 45 - 117 U/L    AST (SGOT) 29 15 - 37 U/L    ALT (SGPT) 26 16 - 61 U/L   POC CHEM8   Result Value Ref Range    CO2, POC 23 19 - 24 MMOL/L    Glucose, POC 97 74 - 106 MG/DL    BUN, POC 5 (L) 7 - 18 MG/DL    Creatinine, POC 1.3 0.6 - 1.3 MG/DL    GFRAA, POC >60 >60 ml/min/1.73m    GFRNA, POC 55 (L) >60 ml/min/1.723m   Sodium, POC 139 136 - 145 MMOL/L    Potassium, POC 4.9 3.5 - 5.5 MMOL/L    Calcium, ionized (POC) 1.25 1.12 - 1.32 MMOL/L    Chloride, POC 93 (L) 100 - 108 MMOL/L    Anion gap, POC 29 (H) 10 - 20      Hematocrit, POC 33 (L) 36 - 49 %    Hemoglobin, POC 11.2 (L) 12 - 16 G/DL     Hospital Outpatient Visit on 07/19/2016   Component Date Value Ref Range Status   ??? WBC 07/19/2016 4.6  4.5 - 13.0 K/uL Final   ??? RBC 07/19/2016 3.54* 4.10 - 5.10 M/uL Final   ??? HGB 07/19/2016 10.7* 12.0 - 16.0 g/dL Final   ??? HCT 07/19/2016 34.0* 36 - 48 % Final   ??? MCV 07/19/2016 96.0  78 - 102 FL Final   ??? MCH 07/19/2016 30.2  25.0 - 35.0 PG Final   ???  MCHC 07/19/2016 31.5  31 - 37 g/dL Final   ??? RDW 07/19/2016 12.9  11.5 - 14.5 % Final   ??? PLATELET 07/19/2016 101* 140 - 440 K/uL Final   ??? NEUTROPHILS 07/19/2016 74* 40 - 70 % Final   ??? MIXED CELLS 07/19/2016 10  0.1 - 17 % Final   ??? LYMPHOCYTES 07/19/2016 16  14 - 44 % Final   ??? ABS. NEUTROPHILS 07/19/2016 3.4  1.8 - 9.5 K/UL Final   ??? ABS. MIXED CELLS 07/19/2016 0.5  0.0 - 2.3 K/uL Final   ??? ABS. LYMPHOCYTES 07/19/2016 0.7* 1.1 - 5.9 K/UL Final     Test performed at [facility]. Results Reviewed by Medical Director.   ??? DF 07/19/2016 AUTOMATED    Final   ??? Protein, total 07/19/2016 7.7  6.4 - 8.2 g/dL Final   ??? Albumin 07/19/2016 4.1  3.4 - 5.0 g/dL Final   ??? Globulin 07/19/2016 3.6  2.0 - 4.0 g/dL Final   ??? A-G Ratio 07/19/2016 1.1  0.8 - 1.7   Final   ??? Bilirubin, total 07/19/2016 0.5  0.2 - 1.0 MG/DL Final   ??? Bilirubin, direct 07/19/2016 0.2  0.0 - 0.2 MG/DL Final   ??? Alk. phosphatase 07/19/2016 167* 45 - 117 U/L Final   ??? AST (SGOT) 07/19/2016 29  15 - 37 U/L Final   ??? ALT (SGPT) 07/19/2016 26  16 - 61 U/L Final   ??? CO2, POC 07/19/2016 23  19 - 24 MMOL/L Final   ??? Glucose, POC 07/19/2016 97  74 - 106 MG/DL Final   ??? BUN, POC 07/19/2016 5* 7 - 18 MG/DL Final   ??? Creatinine, POC 07/19/2016 1.3  0.6 - 1.3 MG/DL Final   ??? GFRAA, POC 07/19/2016 >60  >60 ml/min/1.38m Final   ??? GFRNA, POC 07/19/2016 55* >60 ml/min/1.716mFinal    Comment: Estimated GFR is calculated using the IDMS-traceable Modification of Diet in Renal Disease (MDRD) Study equation, reported for both African Americans (GFRAA) and non-African Americans (GFRNA), and normalized to 1.7324mody surface area. The physician must decide which value applies to the patient.  The MDRD study equation should only be used in individuals age 63 62 older. It has not been validated for the following: pregnant women, patients with serious comorbid conditions, or on certain medications, or persons with extremes of body size, muscle mass, or nutritional status.     ??? Sodium, POC 07/19/2016 139  136 - 145 MMOL/L Final   ??? Potassium, POC 07/19/2016 4.9  3.5 - 5.5 MMOL/L Final   ??? Calcium, ionized (POC) 07/19/2016 1.25  1.12 - 1.32 MMOL/L Final   ??? Chloride, POC 07/19/2016 93* 100 - 108 MMOL/L Final   ??? Anion gap, POC 07/19/2016 29* 10 - 20   Final   ??? Hematocrit, POC 07/19/2016 33* 36 - 49 % Final   ??? Hemoglobin, POC 07/19/2016 11.2* 12 - 16 G/DL Final    Hospital Outpatient Visit on 06/30/2016   Component Date Value Ref Range Status   ??? Hemoglobin A1c 06/30/2016 5.6  4.2 - 5.6 % Final    Comment: (NOTE)  HbA1C Interpretive Ranges  <5.7              Normal  5.7 - 6.4         Consider Prediabetes  >6.5              Consider Diabetes     ??? Est. average glucose 06/30/2016 114  mg/dL Final    Comment: (NOTE)  The eAG should be interpreted with patient characteristics in mind   since ethnicity, interindividual differences, red cell lifespan,   variation in rates of glycation, etc. may affect the validity of the   calculation.     ??? LIPID PROFILE 06/30/2016        Final   ??? Cholesterol, total 06/30/2016 86  <200 MG/DL Final   ??? Triglyceride 06/30/2016 221* <150 MG/DL Final    Comment: The drugs N-acetylcysteine (NAC) and  Metamiszole have been found to cause falsely  low results in this chemical assay. Please  be sure to submit blood samples obtained  BEFORE administration of either of these  drugs to assure correct results.     ??? HDL Cholesterol 06/30/2016 27* 40 - 60 MG/DL Final   ??? LDL, calculated 06/30/2016 14.8  0 - 100 MG/DL Final   ??? VLDL, calculated 06/30/2016 44.2  MG/DL Final   ??? CHOL/HDL Ratio 06/30/2016 3.2  0 - 5.0   Final   ??? TSH 06/30/2016 1.86  0.36 - 3.74 uIU/mL Final   ??? Color 06/30/2016 YELLOW    Final   ??? Appearance 06/30/2016 CLEAR    Final   ??? Specific gravity 06/30/2016 1.017  1.005 - 1.030   Final   ??? pH (UA) 06/30/2016 6.5  5.0 - 8.0   Final   ??? Protein 06/30/2016 NEGATIVE   NEG mg/dL Final   ??? Glucose 06/30/2016 NEGATIVE   NEG mg/dL Final   ??? Ketone 06/30/2016 NEGATIVE   NEG mg/dL Final   ??? Bilirubin 06/30/2016 NEGATIVE   NEG   Final   ??? Blood 06/30/2016 NEGATIVE   NEG   Final   ??? Urobilinogen 06/30/2016 0.2  0.2 - 1.0 EU/dL Final   ??? Nitrites 06/30/2016 NEGATIVE   NEG   Final   ??? Leukocyte Esterase 06/30/2016 NEGATIVE   NEG   Final   Hospital Outpatient Visit on 05/31/2016   Component Date Value Ref Range Status    ??? WBC 05/31/2016 3.5* 4.5 - 13.0 K/uL Final   ??? RBC 05/31/2016 3.64* 4.10 - 5.10 M/uL Final   ??? HGB 05/31/2016 10.8* 12.0 - 16.0 g/dL Final   ??? HCT 05/31/2016 34.7* 36 - 48 % Final   ??? MCV 05/31/2016 95.3  78 - 102 FL Final   ??? MCH 05/31/2016 29.7  25.0 - 35.0 PG Final   ??? MCHC 05/31/2016 31.1  31 - 37 g/dL Final   ??? RDW 05/31/2016 24.2* 11.5 - 14.5 % Final   ??? PLATELET 05/31/2016 70* 140 - 440 K/uL Final   ??? NEUTROPHILS 05/31/2016 71* 40 - 70 % Final   ??? MIXED CELLS 05/31/2016 12  0.1 - 17 % Final   ??? LYMPHOCYTES 05/31/2016 17  14 - 44 % Final   ??? ABS. NEUTROPHILS 05/31/2016 2.5  1.8 - 9.5 K/UL Final   ??? ABS. MIXED CELLS 05/31/2016 0.4  0.0 - 2.3 K/uL Final   ??? ABS. LYMPHOCYTES 05/31/2016 0.6* 1.1 - 5.9 K/UL Final    Test performed at [place] Location. Results Reviewed by Medical Director.   ??? DF 05/31/2016 AUTOMATED    Final   ??? Ferritin 05/31/2016 189  8 - 388 NG/ML Final   ??? Iron 05/31/2016 83  50 - 175 ug/dL Final    Patients receiving metal-binding drugs (e.g. deferoxamine) may show spuriously depressed iron values, as chelated iron may not properly react in the iron assay.   ???  TIBC 05/31/2016 343  250 - 450 ug/dL Final   ??? Iron % saturation 05/31/2016 24  % Final          Follow-up Disposition:  Return in about 1 month (around 08/30/2016).

## 2016-08-03 ENCOUNTER — Encounter

## 2016-08-03 MED ORDER — METFORMIN 500 MG TAB
500 mg | ORAL_TABLET | Freq: Two times a day (BID) | ORAL | 0 refills | Status: DC
Start: 2016-08-03 — End: 2016-08-30

## 2016-08-03 NOTE — Telephone Encounter (Signed)
Pt calling to request medication refill of:    Requested Prescriptions     Pending Prescriptions Disp Refills   ??? metFORMIN (GLUCOPHAGE) 500 mg tablet 360 Tab 1     Sig: Take 2 Tabs by mouth two (2) times daily (with meals).          be sent to [facility] at [place] has about 8 tabs remaining.     Pts last appt was 08/02/16, next appt sched for 09/02/16.   Advised pt of 72 hour time frame for refill requests. Please advise.

## 2016-08-12 NOTE — Telephone Encounter (Signed)
I did talk to his Wife Mrs.Seger ,she said he has been feeling tired ,sad low energy since he stopped the nortriptyline about 2 weeks ago, and she said when she gave him the nortriptyline 50 mg last night ( after being off it for 2 weeks as per Dr.Juer  instructions ) he slept well ,but she think he is still suffering from the side effects  Of stopping the medications . I advised her if he has no fever,vomiting ,diarrhea or altered mental status ,he can resume the medication at 50 mg for 1 week if his symptoms are better ,will call in 25 mg to start tapering off ,if not better need to follow up at the office .

## 2016-08-12 NOTE — Addendum Note (Signed)
Addended by: [name] on: 08/12/2016 12:11 PM      Modules accepted: Orders

## 2016-08-12 NOTE — Telephone Encounter (Signed)
Patient's wife called in regards of patient possible detoxing from medication. Wife stated husband is severely depressed, sleeping all day, fatigued and nauseous. Dr. Phineas Douglas stated she would review chart and give the patient a call.

## 2016-08-16 ENCOUNTER — Inpatient Hospital Stay: Admit: 2016-08-16 | Payer: MEDICARE | Primary: Legal Medicine

## 2016-08-16 ENCOUNTER — Ambulatory Visit
Admit: 2016-08-16 | Discharge: 2016-08-16 | Payer: PRIVATE HEALTH INSURANCE | Attending: Medical Oncology | Primary: Legal Medicine

## 2016-08-16 DIAGNOSIS — D500 Iron deficiency anemia secondary to blood loss (chronic): Secondary | ICD-10-CM

## 2016-08-16 DIAGNOSIS — Z452 Encounter for adjustment and management of vascular access device: Secondary | ICD-10-CM

## 2016-08-16 LAB — IRON PROFILE
Iron % saturation: 5 %
Iron: 25 ug/dL — ABNORMAL LOW (ref 50–175)
TIBC: 467 ug/dL — ABNORMAL HIGH (ref 250–450)

## 2016-08-16 LAB — CBC WITH 3 PART DIFF
ABS. LYMPHOCYTES: 0.5 10*3/uL — ABNORMAL LOW (ref 1.1–5.9)
ABS. MIXED CELLS: 0.3 10*3/uL (ref 0.0–2.3)
ABS. NEUTROPHILS: 2 10*3/uL (ref 1.8–9.5)
HCT: 29.5 % — ABNORMAL LOW (ref 36–48)
HGB: 9 g/dL — ABNORMAL LOW (ref 12.0–16.0)
LYMPHOCYTES: 18 % (ref 14–44)
MCH: 27.8 PG (ref 25.0–35.0)
MCHC: 30.5 g/dL — ABNORMAL LOW (ref 31–37)
MCV: 91 FL (ref 78–102)
Mixed cells: 11 % (ref 0.1–17)
NEUTROPHILS: 71 % — ABNORMAL HIGH (ref 40–70)
PLATELET: 85 10*3/uL — ABNORMAL LOW (ref 140–440)
RBC: 3.24 M/uL — ABNORMAL LOW (ref 4.10–5.10)
RDW: 13.2 % (ref 11.5–14.5)
WBC: 2.8 10*3/uL — ABNORMAL LOW (ref 4.5–13.0)

## 2016-08-16 LAB — FERRITIN: Ferritin: 8 NG/ML (ref 8–388)

## 2016-08-16 MED ORDER — SODIUM CHLORIDE 0.9 % IV
Freq: Once | INTRAVENOUS | Status: AC
Start: 2016-08-16 — End: 2016-08-16
  Administered 2016-08-16: 20:00:00 via INTRAVENOUS

## 2016-08-16 MED FILL — SODIUM CHLORIDE 0.9 % IV: INTRAVENOUS | Qty: 500

## 2016-08-16 NOTE — Progress Notes (Signed)
[facility]:     Patient: William Lauritsen Sr. Age: 71 y.o. Sex: male    Date of Birth: Feb 14, 1946 Admit Date: (Not on file) PCP: [name], MD   MRN: [id number]  CSN: [id number]       Patient Active Problem List   Diagnosis Code   ??? Chronic anemia D64.9   ??? Controlled type 2 diabetes mellitus without complication, without long-term current use of insulin ([place]) E11.9   ??? Essential hypertension I10   ??? Hyperlipidemia E78.5   ??? History of stroke Z86.73   ??? Anxiety F41.9   ??? Chronic pain G89.29   ??? Iron deficiency anemia D50.9   ??? 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 Other cirrhosis of liver (HCC) K74.69   ??? Depression F32.9   ??? Type II diabetes mellitus (HCC) E11.9   ??? HTN (hypertension) I10   ??? 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 Stroke (HCC) I63.9   ??? Compressed vertebrae (HCC) G95.20   ??? Chronic back pain greater than 3 months duration M54.9, G89.29   ??? Hyperlipemia E78.5   ??? Anxiety F41.9     Assessment/ Plan:     1.)  Iron Deficiency Anemia-IDA  -Secondary to chronic GI blood loss  -History of poor iron absorption  -Requires frequent IV iron replacement  -s/p replacement with IV iron with Injectafer October/November 2017  -Cont. IV iron replacement every 2-3 months    -A repeat CBC today-Pending  -Monitor  -Return to clinic in 4 weeks with Dr. 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 instructed to go to ER if symptoms persist or worsen    I have discussed the diagnosis with the patient and the intended plan as seen in the above orders. I have reviewed the plan of care with the patient, accepted their input and they are in agreement with the treatment goals. Patient has provided input and agrees with goals.    History:    William Kenworthy Sr. is a 71 y.o. male presenting today for:      Iron Deficiency Anemia-IDA  -Patient with history of poor iron absorption  -Patient requiring frequent IV iron replacement    Subjective:    Complaining of generalized fatigue and weakness.    Denied dizziness or lightheadedness  No f/c/s    Current Outpatient Prescriptions   Medication Sig Dispense Refill   ??? metFORMIN (GLUCOPHAGE) 500 mg tablet Take 2 Tabs by mouth two (2) times daily (with meals). 360 Tab 0   ??? cyclobenzaprine (FLEXERIL) 10 mg tablet Take  by mouth three (3) times daily as needed for Muscle Spasm(s).     ??? esomeprazole (NEXIUM) 20 mg capsule Take  by mouth daily.     ??? sucralfate (CARAFATE) 1 gram tablet Take 1 g by mouth four (4) times daily.     ??? LORazepam (ATIVAN) 1 mg tablet Take  by mouth every four (4) hours as needed for Anxiety.     ??? prochlorperazine (COMPAZINE) 10 mg tablet Take 5 mg by mouth every six (6) hours as needed.     ??? aspirin 81 mg chewable tablet Take 81 mg by mouth daily.     ???  citalopram (CELEXA) 40 mg tablet Take 40 mg by mouth daily.     ??? gemfibrozil (LOPID) 600 mg tablet Take 600 mg by mouth two (2) times a day.     ??? codeine-butalbital-acetaminophen-caffeine (FIORICET WITH CODEINE) 50-325-40-30 mg capsule Take  by mouth.     ??? MULTIVIT-MIN/FA/LYCOPEN/LUTEIN (CENTRUM SILVER MEN PO) Take  by mouth.     ??? oxyCODONE IR (ROXICODONE) 20 mg immediate release tablet Take  by mouth every four (4) hours as needed for Pain.     ??? tamsulosin (FLOMAX) 0.4 mg capsule Take 0.4 mg by mouth daily.     ??? propranolol (INDERAL) 20 mg tablet Take  by mouth three (3) times daily.     ??? topiramate (TOPAMAX) 100 mg tablet Take  by mouth two (2) times a day.     ??? rosuvastatin (CRESTOR) 10 mg tablet Take 10 mg by mouth nightly.     ??? cholecalciferol, vitamin D3, (VITAMIN D3) 2,000 unit tab Take  by mouth.     ??? B.infantis-B.ani-B.long-B.bifi (PROBIOTIC 4X) 10-15 mg TbEC Take  by mouth.      ??? triamcinolone acetonide (KENALOG) 0.1 % topical cream Apply  to affected area two (2) times a day. use thin layer 453.6 g 0   ??? topiramate (TOPAMAX) 100 mg tablet Take 1 Tab by mouth daily. 90 Tab 3   ??? gemfibrozil (LOPID) 600 mg tablet TAKE 1 TABLET BY MOUTH TWICE DAILY 180 Tab 3   ??? prochlorperazine (COMPAZINE) 10 mg tablet Take 0.5 Tabs by mouth every six (6) hours as needed. 30 Tab 5   ??? citalopram (CELEXA) 40 mg tablet Take 1 Tab by mouth daily. 90 Tab 3   ??? B INFANTIS/B ANI/B LON/B BIFID (PROBIOTIC 4X PO) Take  by mouth.     ??? cholecalciferol, vitamin D3, (VITAMIN D3) 2,000 unit tab Take  by mouth.     ??? aspirin delayed-release 81 mg tablet Take  by mouth daily.     ??? propranolol (INDERAL) 20 mg tablet Take  by mouth three (3) times daily.     ??? metFORMIN (GLUCOPHAGE) 500 mg tablet Take  by mouth two (2) times daily (with meals).     ??? lactulose (CHRONULAC) 10 gram/15 mL solution Take  by mouth three (3) times daily.     ??? codeine-butalbital-aspirin-caffeine (BUTALBITAL COMPOUND-CODEINE) 30-50-325-40 mg per capsule Take  by mouth every six (6) hours as needed for Pain.     ??? sucralfate (CARAFATE) 1 gram tablet Take 1 g by mouth four (4) times daily.     ??? folic acid (FOLVITE) 1 mg tablet Take  by mouth daily.     ??? oxyCODONE ER (OXYCONTIN) 20 mg ER tablet Take 20 mg by mouth every twelve (12) hours.     ??? tamsulosin (FLOMAX) 0.4 mg capsule Take 0.4 mg by mouth daily.     ??? cyclobenzaprine (FLEXERIL) 10 mg tablet Take  by mouth three (3) times daily as needed for Muscle Spasm(s).     ??? esomeprazole (NEXIUM) 20 mg capsule Take  by mouth daily.     ??? multivitamin, tx-iron-ca-min (THERA-M W/ IRON) 9 mg iron-400 mcg tab tablet Take 1 Tab by mouth daily.         Objective:   VS:      Physical Exam:  ??  Constitutional:?? no acute distress  alert and oriented x 3  pale appearing   HENT:?? atraumatic   Eyes:?? EOMI   Neck:?? No mass  Cardiovascular:??  S1, S2   Pulmonary/Chest Wall:?? clear   Abdominal:?? Non tender, soft   + BS       Musculoskeletal:?? No deformity   Skin:?? No rash   Peripheral Vascular:?? Pulses palpable       Review of Systems:  The remainder of 12 point ROS was otherwise negative except for what was reported in the CC/HPI:      No results found for this or any previous visit (from the past 168 hour(s)).    Past Medical History:   Diagnosis Date   ??? Anemia    ??? Anxiety    ??? Chronic pain    ??? Cirrhosis of liver ([place])    ??? Diabetes ([place])    ??? GERD (gastroesophageal reflux disease)    ??? Hyperlipemia    ??? Hypertension    ??? Hypotension    ??? Migraine    ??? Other cirrhosis of liver ([place])    ??? Stroke [facility])     had 3 strokes        Past Surgical History:   Procedure Laterality Date   ??? HX CHOLECYSTECTOMY     ??? HX ORTHOPAEDIC      R hand sx   ??? HX OTHER SURGICAL      Hand surgery- Nail gun went through finger       Social History     Social History   ??? Marital status: MARRIED     Spouse name: N/A   ??? Number of children: N/A   ??? Years of education: N/A     Occupational History   ??? Not on file.     Social History Main Topics   ??? Smoking status: Former Smoker   ??? Smokeless tobacco: Never Used      Comment: quit years ago   ??? Alcohol use No   ??? Drug use: No   ??? Sexual activity: Yes     Partners: Female, Male     Other Topics Concern   ??? Not on file     Social History Narrative    ** Merged History Encounter **            Family History   Problem Relation Age of Onset   ??? Alzheimer Mother    ??? Diabetes Mother    ??? Cancer Mother      colon cancer    ??? Hypertension Father    ??? Heart Disease Father    ??? Cancer Father      prostate, lung cancer    ??? Diabetes Sister    ??? Heart Disease Sister    ??? Diabetes Brother        Allergies   Allergen Reactions   ??? Doxycycline Hives   ??? Doxycycline Rash   ??? Gabapentin Other (comments)     Per pt became violent    ??? Gabapentin Other (comments)     Slurred speech   ??? Lyrica [Pregabalin] Other (comments)     Per pt stroke symptoms   ??? Lyrica [Pregabalin] Anxiety   ??? Penicillin G Rash    ??? Penicillins Hives       Follow-up Disposition: Not on File    Ruchi Stoney B. Mercie Eon, MD, MPH Hematology-Medical Oncology  August 16, 2016 2:39 PM

## 2016-08-16 NOTE — Progress Notes (Signed)
[facility] Progress Note    Date: August 16, 2016    Name: William Blanchard [facility] Sr.    MRN: [id number]         DOB: 04-24-1946    William Blanchard arrived to [facility] accompanied by wife.    William Blanchard was assessed and education was provided.     William Blanchard vitals were reviewed and patient was observed for 5 minutes prior to treatment.   Patient Vitals for the past 12 hrs:   Temp Pulse Resp BP   08/16/16 1525 97.1 ??F (36.2 ??C) 64 18 101/59         Lab results were obtained and reviewed.  Recent Results (from the past 12 hour(s))   IRON PROFILE    Collection Time: 08/16/16  2:45 PM   Result Value Ref Range    Iron 25 (L) 50 - 175 ug/dL    TIBC 467 (H) 250 - 450 ug/dL    Iron % saturation 5 %   FERRITIN    Collection Time: 08/16/16  2:45 PM   Result Value Ref Range    Ferritin 8 8 - 388 NG/ML   CBC WITH 3 PART DIFF    Collection Time: 08/16/16  2:55 PM   Result Value Ref Range    WBC 2.8 (L) 4.5 - 13.0 K/uL    RBC 3.24 (L) 4.10 - 5.10 M/uL    HGB 9.0 (L) 12.0 - 16.0 g/dL    HCT 29.5 (L) 36 - 48 %    MCV 91.0 78 - 102 FL    MCH 27.8 25.0 - 35.0 PG    MCHC 30.5 (L) 31 - 37 g/dL    RDW 13.2 11.5 - 14.5 %    PLATELET 85 (L) 140 - 440 K/uL    NEUTROPHILS 71 (H) 40 - 70 %    MIXED CELLS 11 0.1 - 17 %    LYMPHOCYTES 18 14 - 44 %    ABS. NEUTROPHILS 2.0 1.8 - 9.5 K/UL    ABS. MIXED CELLS 0.3 0.0 - 2.3 K/uL    ABS. LYMPHOCYTES 0.5 (L) 1.1 - 5.9 K/UL    DF AUTOMATED         Saline lock started to right arm using 20g catheter x1 attempt. Specimen collected for cbc w/diff, Iron profile and Ferritin.      Normal saline 500 ML was infused over 1/2 hour.    After infusion complete line flushed with 34ml ns then removed. Gauze and coban applied to site. Dr Mercie Eon notified of blood pressure after infusion, no new orders.     William Blanchard tolerated the infusion, and had no complaints.  Patient armband removed and shredded.    William Blanchard was discharged from [place] in stable  condition at 1525. He is to follow up with physician for next for his next appointment.    [name], RN  August 16, 2016  3:48 PM

## 2016-08-19 NOTE — Telephone Encounter (Signed)
William Blanchard would like the results of her husbands lab test

## 2016-08-20 MED ORDER — IRON SUCROSE 100 MG/5 ML IV SOLN
100 mg iron/5 mL | Freq: Once | INTRAVENOUS | Status: AC
Start: 2016-08-20 — End: 2016-08-23
  Administered 2016-08-23: 17:00:00 via INTRAVENOUS

## 2016-08-20 MED FILL — VENOFER 100 MG IRON/5 ML INTRAVENOUS SOLUTION: 100 mg iron/5 mL | INTRAVENOUS | Qty: 10

## 2016-08-20 NOTE — Telephone Encounter (Signed)
Please contact Mrs Sieker, she would like to speak with you concerning her husbands iron and why he has to come in weekly

## 2016-08-23 ENCOUNTER — Inpatient Hospital Stay: Admit: 2016-08-23 | Payer: MEDICARE | Primary: Legal Medicine

## 2016-08-23 MED ORDER — SODIUM CHLORIDE 0.9 % IJ SYRG
INTRAMUSCULAR | Status: DC | PRN
Start: 2016-08-23 — End: 2016-08-27
  Administered 2016-08-23 (×2): via INTRAVENOUS

## 2016-08-23 MED ORDER — IRON SUCROSE 100 MG/5 ML IV SOLN
100 mg iron/5 mL | Freq: Once | INTRAVENOUS | Status: AC
Start: 2016-08-23 — End: 2016-08-30
  Administered 2016-08-30: 17:00:00 via INTRAVENOUS

## 2016-08-23 MED FILL — BD POSIFLUSH NORMAL SALINE 0.9 % INJECTION SYRINGE: INTRAMUSCULAR | Qty: 40

## 2016-08-23 MED FILL — VENOFER 100 MG IRON/5 ML INTRAVENOUS SOLUTION: 100 mg iron/5 mL | INTRAVENOUS | Qty: 10

## 2016-08-23 NOTE — Progress Notes (Signed)
[facility] OPIC Progress Note    Date: August 23, 2016    Name: William Agne [facility] Sr.    MRN: [id number]         DOB: Oct 31, 1945      [name] arrived in the [facility] today, at 1130, in stable condition, here for Dose # 1 of 5, Weekly IV Venofer. He was assessed and education was provided.     [name] vitals were reviewed.  Visit Vitals   ??? BP 97/56 (BP 1 Location: Right arm, BP Patient Position: At rest;Sitting)   ??? Pulse 69   ??? Temp 98.7 ??F (37.1 ??C)   ??? Resp 16             PIV was established in his right AC at 1155, without incident.         Venofer 200 mg IV, was administered over 5 minutes, per order, and without incident.       After completion of the IV Venofer, the PIV was flushed well with 20 ml NS, and clamped, and [name] was monitored for 30 minutes, per order, and also without incident.       After completion of the monitoring period, the PIV was removed and gauze/bandaid was applied.              [name] tolerated well, and had no complaints.    [name] was discharged from [place] in stable condition at 1245.Marland Kitchen He is to return in 1 week, on next Monday, 08-30-16,  at 1100,  for his next appointment, for Dose # 2 of 5, IV Venofer.     [name], RN  August 23, 2016  11:49 AM

## 2016-08-24 ENCOUNTER — Other Ambulatory Visit: Payer: Self-pay | Admitting: Gastroenterology

## 2016-08-30 ENCOUNTER — Inpatient Hospital Stay: Admit: 2016-08-30 | Payer: MEDICARE | Primary: Legal Medicine

## 2016-08-30 DIAGNOSIS — D509 Iron deficiency anemia, unspecified: Secondary | ICD-10-CM

## 2016-08-30 MED ORDER — SUCRALFATE 1 GRAM TAB
1 gram | ORAL_TABLET | ORAL | 0 refills | Status: DC
Start: 2016-08-30 — End: 2016-09-26

## 2016-08-30 MED ORDER — SODIUM CHLORIDE 0.9 % IJ SYRG
INTRAMUSCULAR | Status: DC | PRN
Start: 2016-08-30 — End: 2016-09-03
  Administered 2016-08-30 (×2)

## 2016-08-30 MED FILL — BD POSIFLUSH NORMAL SALINE 0.9 % INJECTION SYRINGE: INTRAMUSCULAR | Qty: 40

## 2016-08-30 MED FILL — VENOFER 100 MG IRON/5 ML INTRAVENOUS SOLUTION: 100 mg iron/5 mL | INTRAVENOUS | Qty: 10

## 2016-08-30 NOTE — Progress Notes (Signed)
[facility] OPIC Progress Note    Date: August 30, 2016    Name: William Blanchard [facility] Sr.    MRN: [id number]         DOB: 04-Sep-1945     Venofer IVP 2 of 5      [name] was assessed and education was provided. C/o of headache today.    [name] vitals were reviewed and patient was observed for 5 minutes prior to treatment.   Visit Vitals   ??? BP 95/58 (BP 1 Location: Left arm, BP Patient Position: Sitting)   ??? Pulse 68   ??? Temp 98.2 ??F (36.8 ??C)   ??? Resp 16   ??? SpO2 100%     No results found for this or any previous visit (from the past 12 hour(s)).    No pre-medications were ordered.     IV started in back of right hand w/24 gauge INT w/o difficulty.  Good blood return obtained, then flushed with 10 ml NS.    Venofer 200 mg was infused  By slow IVP over 9 minutes, followed by 10 ml NS flush. IV removed, no irritation noted at site, 2x2 guaze and Coban applied.  Patient declined to stay for observation.  States had no problems with last infusion.  No s/s reaction noted.    [name] tolerated the infusion, and had no complaints.  Patient armband removed and shredded.    [name] was discharged from [place] in stable condition at 1230. He is to return on 09/06/2016 at 1100 for his next appointment for 3 of 5 Venofer infusions.    Lanell Persons, RN  August 30, 2016  12:52 PM

## 2016-08-31 MED ORDER — IRON SUCROSE 100 MG/5 ML IV SOLN
100 mg iron/5 mL | Freq: Once | INTRAVENOUS | Status: AC
Start: 2016-08-31 — End: 2016-09-06
  Administered 2016-09-06: 16:00:00 via INTRAVENOUS

## 2016-08-31 MED FILL — VENOFER 100 MG IRON/5 ML INTRAVENOUS SOLUTION: 100 mg iron/5 mL | INTRAVENOUS | Qty: 10

## 2016-09-02 ENCOUNTER — Encounter: Attending: Legal Medicine | Primary: Legal Medicine

## 2016-09-06 ENCOUNTER — Inpatient Hospital Stay: Admit: 2016-09-06 | Payer: MEDICARE | Primary: Legal Medicine

## 2016-09-06 MED ORDER — IRON SUCROSE 100 MG/5 ML IV SOLN
100 mg iron/5 mL | Freq: Once | INTRAVENOUS | Status: AC
Start: 2016-09-06 — End: 2016-09-13
  Administered 2016-09-13: 16:00:00 via INTRAVENOUS

## 2016-09-06 MED FILL — VENOFER 100 MG IRON/5 ML INTRAVENOUS SOLUTION: 100 mg iron/5 mL | INTRAVENOUS | Qty: 10

## 2016-09-06 NOTE — Progress Notes (Signed)
[facility] OPIC Progress Note    Date: September 06, 2016    Name: William Blanchard [facility] Sr.    MRN: [id number]         DOB: [birth year]    Venofer Infusion    William Blanchard arrived ambulatory accompanied by wife at 53.    William Blanchard was assessed and education was provided.     William Blanchard vitals were reviewed and patient was observed for 5 minutes prior to treatment.   Visit Vitals   ??? BP 104/58 (BP 1 Location: Left arm, BP Patient Position: Sitting)   ??? Pulse 77   ??? Temp 97 ??F (36.1 ??C)   ??? Resp 18       Lab results were obtained and reviewed.  No results found for this or any previous visit (from the past 12 hour(s)).    Saline lock started to right antecubital vein using 24g catheter line flushes briskly.     Venofer 200 mg was infused over 5 mins. IVP. Line flushed with 5 ml normal saline.     Saline lock removed. Gauze and coban applied to site.    William Blanchard tolerated the infusion, and had no complaints.  Patient armband removed and shredded.    William Blanchard was discharged from [place] in stable condition at 1205. He is to return on 09/13/16 at 1100 for his next appointment.    [name], RN  September 06, 2016  2:16 PM

## 2016-09-07 MED ORDER — NORTRIPTYLINE 25 MG CAP
25 mg | ORAL_CAPSULE | Freq: Every evening | ORAL | 1 refills | Status: DC
Start: 2016-09-07 — End: 2016-09-10

## 2016-09-07 NOTE — Telephone Encounter (Signed)
Per pharmacist there is an interaction between the nortriptyline and the citalopram. Per Dr. Phineas Douglas, hold the nortriptyline until seen in the office again in two days. Pharmacist stated understanding.

## 2016-09-07 NOTE — Telephone Encounter (Signed)
Refill done

## 2016-09-07 NOTE — Telephone Encounter (Signed)
Pt's wife is calling to follow up on conversation at the office. Pt took his last nortriptyline tablet yesterday. He was on 50 mg but was trying to wean himself, cutting it in half. Pt has an appt scheduled to 03/15 but took last tab 03/12 and wife is asking if Dr. Phineas Douglas wants to send a new Rx for nortriptyline 25 mg to [facility]. Please advise.  Requested Prescriptions     Pending Prescriptions Disp Refills   ??? nortriptyline (PAMELOR) 25 mg capsule 30 Cap 0     Sig: Take 1 Cap by mouth nightly.

## 2016-09-09 ENCOUNTER — Encounter: Attending: Legal Medicine | Primary: Legal Medicine

## 2016-09-10 ENCOUNTER — Encounter: Admit: 2016-09-10 | Primary: Legal Medicine

## 2016-09-10 ENCOUNTER — Ambulatory Visit: Admit: 2016-09-10 | Attending: Legal Medicine | Primary: Legal Medicine

## 2016-09-10 ENCOUNTER — Inpatient Hospital Stay: Admit: 2016-09-10 | Payer: PRIVATE HEALTH INSURANCE | Primary: Legal Medicine

## 2016-09-10 DIAGNOSIS — Z0000 Encounter for general adult medical examination without abnormal findings: Secondary | ICD-10-CM

## 2016-09-10 DIAGNOSIS — R1013 Epigastric pain: Secondary | ICD-10-CM

## 2016-09-10 MED ORDER — PANTOPRAZOLE 40 MG TAB, DELAYED RELEASE
40 mg | ORAL_TABLET | Freq: Every day | ORAL | 0 refills | Status: DC
Start: 2016-09-10 — End: 2016-10-07

## 2016-09-10 MED ORDER — NORTRIPTYLINE 25 MG CAP
25 mg | ORAL_CAPSULE | Freq: Every evening | ORAL | 1 refills | Status: DC
Start: 2016-09-10 — End: 2016-11-12

## 2016-09-10 NOTE — Progress Notes (Signed)
William Blalock [place] Sr. is a 71 y.o. male presents in office to be seen for Medicare Wellness, htn and dm.    Health Maintenance Due   Topic Date Due   ??? Hepatitis C Screening  1945-12-04   ??? FOOT EXAM Q1  03/19/1958   ??? MICROALBUMIN Q1  03/19/1958   ??? EYE EXAM RETINAL OR DILATED Q1  03/19/1958   ??? DTaP/Tdap/Td series (1 - Tdap) 03/19/1969   ??? FOBT Q 1 YEAR AGE 66-75  03/19/1998   ??? ZOSTER VACCINE AGE 11>  01/17/2008   ??? GLAUCOMA SCREENING Q2Y  03/19/2013   ??? Pneumococcal 65+ Low/Medium Risk (1 of 2 - PCV13) 03/19/2013   ??? MEDICARE YEARLY EXAM  03/19/2013       1. Have you been to the ER, urgent care clinic since your last visit?  Hospitalized since your last visit?no    2. Have you seen or consulted any other health care providers outside of the [facility] since your last visit?  Include any pap smears or colon screening. no

## 2016-09-10 NOTE — Progress Notes (Signed)
Known to have anemia and DM ,no further action

## 2016-09-10 NOTE — Progress Notes (Signed)
(AWV) The Initial Medicare Annual Wellness Exam PROGRESS NOTE    This is an Initial Medicare Annual Wellness Exam (AWV) (Performed 12 months after IPPE or effective date of Medicare Part B enrollment, Once in a lifetime)    I have reviewed the patient's medical history in detail and updated the computerized patient record.     William Blalock Elsasser Sr. is a 71 y.o. Caucasian male and presents for an annual wellness exam       Patient Active Problem List   Diagnosis Code   ??? Chronic anemia D64.9   ??? Controlled type 2 diabetes mellitus without complication, without long-term current use of insulin ([place]) E11.9   ??? Essential hypertension I10   ??? Hyperlipidemia E78.5   ??? History of stroke Z86.73   ??? Anxiety F41.9   ??? Chronic pain G89.29   ??? Iron deficiency anemia D50.9   ??? 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 Other cirrhosis of liver (HCC) K74.69   ??? Depression F32.9   ??? Type II diabetes mellitus (HCC) E11.9   ??? HTN (hypertension) I10   ??? 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 Stroke (HCC) I63.9   ??? Compressed vertebrae (HCC) G95.20   ??? Chronic back pain greater than 3 months duration M54.9, G89.29   ??? Hyperlipemia E78.5   ??? Anxiety F41.9   ??? 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 Type II diabetes mellitus ([place]) 08/03/2016   ??? HTN (hypertension) 08/03/2016   ??? 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 Stroke ([place]) 08/03/2016   ??? Compressed vertebrae ([place]) 08/03/2016   ??? Chronic back pain greater than 3 months duration 08/03/2016   ??? Hyperlipemia 08/03/2016   ??? Anxiety 08/03/2016   ??? Depression 06/30/2016   ??? Iron deficiency anemia 04/22/2016   ??? NAFLD (nonalcoholic fatty liver disease) 04/22/2016   ??? Other cirrhosis of liver ([place]) 04/22/2016   ??? Chronic anemia 03/18/2016   ??? Controlled type 2 diabetes mellitus without complication, without long-term current use of insulin ([place]) 03/18/2016    ??? Essential hypertension 03/18/2016   ??? Hyperlipidemia 03/18/2016   ??? History of stroke 03/18/2016   ??? Anxiety 03/18/2016   ??? Chronic pain 03/18/2016     Current Outpatient Prescriptions   Medication Sig Dispense Refill   ??? nortriptyline (PAMELOR) 25 mg capsule Take 1 Cap by mouth nightly. 30 Cap 1   ??? pantoprazole (PROTONIX) 40 mg tablet Take 1 Tab by mouth daily. 30 Tab 0   ??? sucralfate (CARAFATE) 1 gram tablet TAKE 1 TABLET BY MOUTH FOUR TIMES DAILY WITH MEALS AND AT BEDTIME 120 Tab 0   ??? LORazepam (ATIVAN) 1 mg tablet Take  by mouth every four (4) hours as needed for Anxiety.     ??? aspirin 81 mg chewable tablet Take 81 mg by mouth daily.     ??? codeine-butalbital-acetaminophen-caffeine (FIORICET WITH CODEINE) 50-325-40-30 mg capsule Take  by mouth.     ??? MULTIVIT-MIN/FA/LYCOPEN/LUTEIN (CENTRUM SILVER MEN PO) Take  by mouth.     ??? rosuvastatin (CRESTOR) 10 mg tablet Take 10 mg by mouth nightly.     ??? cholecalciferol, vitamin D3, (VITAMIN D3) 2,000 unit tab Take  by mouth.     ??? B.infantis-B.ani-B.long-B.bifi (PROBIOTIC 4X) 10-15 mg TbEC Take  by mouth.     ??? triamcinolone acetonide (KENALOG) 0.1 % topical cream Apply  to affected area two (2) times a day. use thin layer 453.6 g 0   ??? topiramate (TOPAMAX) 100 mg tablet Take 1 Tab by mouth daily. 90 Tab 3   ??? gemfibrozil (LOPID) 600 mg tablet TAKE 1 TABLET BY MOUTH TWICE DAILY 180 Tab 3   ??? prochlorperazine (COMPAZINE) 10 mg tablet Take 0.5 Tabs by mouth every six (6) hours as needed. 30 Tab 5   ??? citalopram (CELEXA) 40 mg tablet Take 1 Tab by mouth daily. 90 Tab 3   ??? propranolol (INDERAL) 20 mg tablet Take  by mouth three (3) times daily.     ??? metFORMIN (GLUCOPHAGE) 500 mg tablet Take  by mouth two (2) times daily (with meals).     ??? lactulose (CHRONULAC) 10 gram/15 mL solution Take  by mouth three (3) times daily.     ??? sucralfate (CARAFATE) 1 gram tablet Take 1 g by mouth four (4) times daily.      ??? oxyCODONE ER (OXYCONTIN) 20 mg ER tablet Take 20 mg by mouth every twelve (12) hours.     ??? tamsulosin (FLOMAX) 0.4 mg capsule Take 0.4 mg by mouth daily.     ??? cyclobenzaprine (FLEXERIL) 10 mg tablet Take  by mouth three (3) times daily as needed for Muscle Spasm(s).       Facility-Administered Medications Ordered in Other Visits   Medication Dose Route Frequency Provider Last Rate Last Dose   ??? [START ON 09/13/2016] iron sucrose (VENOFER) 100 mg iron/5 mL injection 200 mg  200 mg IntraVENous ONCE Dorien Chihuahua, MD         Allergies   Allergen Reactions   ??? Doxycycline Hives   ??? Doxycycline Rash   ??? Gabapentin Other (comments)     Per pt became violent    ??? Gabapentin Other (comments)     Slurred speech   ??? Lyrica [Pregabalin] Other (comments)     Per pt stroke symptoms   ??? Lyrica [Pregabalin] Anxiety   ??? Penicillin G Rash   ??? Penicillins Hives     Past Medical History:   Diagnosis Date   ??? Anemia    ??? Anxiety    ??? Chronic pain    ??? Cirrhosis of liver ([place])    ??? Diabetes ([place])    ??? GERD (gastroesophageal reflux disease)    ??? Hyperlipemia    ??? Hypertension    ??? Hypotension    ??? Migraine    ??? Other cirrhosis of liver ([place])    ??? Stroke [facility])     had 3 strokes      Past Surgical History:   Procedure Laterality Date   ??? HX CHOLECYSTECTOMY     ??? HX ORTHOPAEDIC      R hand sx   ??? HX OTHER SURGICAL      Hand surgery- Nail gun went through finger     Family History   Problem Relation Age of Onset   ??? Alzheimer Mother    ??? Diabetes Mother    ??? Cancer Mother      colon cancer    ??? Hypertension Father    ??? Heart Disease Father    ??? Cancer Father      prostate, lung cancer    ??? Diabetes Sister    ??? Heart Disease Sister    ??? Diabetes Brother      Social History   Substance Use Topics   ??? Smoking status: Former Smoker   ??? Smokeless  tobacco: Never Used      Comment: quit years ago   ??? Alcohol use No       ROS   Negative except Epigasteric pain since last night and he vomited once today    History obtained from chart review and the patient  General ROS: positive for  - malaise  Psychological ROS: positive for - anxiety  Ophthalmic ROS: negative  ENT ROS: positive for - hearing change  Allergy and Immunology ROS: negative  Hematological and Lymphatic ROS: chronic anemia   Endocrine ROS: negative  Breast ROS: negative  Respiratory ROS: negative  Cardiovascular ROS: negative  Gastrointestinal RO epigastric pain and vomiting   Genito-Urinary ROS: no dysuria, trouble voiding, or hematuria  Musculoskeletal ROS: positive for - joint pain, joint stiffness and muscle pain  Neurological ROS: no TIA or stroke symptoms  Dermatological ROS: positive for - rash    All other systems reviewed and are negative.    History     Past Medical History:   Diagnosis Date   ??? Anemia    ??? Anxiety    ??? Chronic pain    ??? Cirrhosis of liver ([place])    ??? Diabetes ([place])    ??? GERD (gastroesophageal reflux disease)    ??? Hyperlipemia    ??? Hypertension    ??? Hypotension    ??? Migraine    ??? Other cirrhosis of liver ([place])    ??? Stroke [facility])     had 3 strokes       Past Surgical History:   Procedure Laterality Date   ??? HX CHOLECYSTECTOMY     ??? HX ORTHOPAEDIC      R hand sx   ??? HX OTHER SURGICAL      Hand surgery- Nail gun went through finger     Current Outpatient Prescriptions   Medication Sig Dispense Refill   ??? nortriptyline (PAMELOR) 25 mg capsule Take 1 Cap by mouth nightly. 30 Cap 1   ??? pantoprazole (PROTONIX) 40 mg tablet Take 1 Tab by mouth daily. 30 Tab 0   ??? sucralfate (CARAFATE) 1 gram tablet TAKE 1 TABLET BY MOUTH FOUR TIMES DAILY WITH MEALS AND AT BEDTIME 120 Tab 0   ??? LORazepam (ATIVAN) 1 mg tablet Take  by mouth every four (4) hours as needed for Anxiety.     ??? aspirin 81 mg chewable tablet Take 81 mg by mouth daily.     ??? codeine-butalbital-acetaminophen-caffeine (FIORICET WITH CODEINE) 50-325-40-30 mg capsule Take  by mouth.     ??? MULTIVIT-MIN/FA/LYCOPEN/LUTEIN (CENTRUM SILVER MEN PO) Take  by mouth.      ??? rosuvastatin (CRESTOR) 10 mg tablet Take 10 mg by mouth nightly.     ??? cholecalciferol, vitamin D3, (VITAMIN D3) 2,000 unit tab Take  by mouth.     ??? B.infantis-B.ani-B.long-B.bifi (PROBIOTIC 4X) 10-15 mg TbEC Take  by mouth.     ??? triamcinolone acetonide (KENALOG) 0.1 % topical cream Apply  to affected area two (2) times a day. use thin layer 453.6 g 0   ??? topiramate (TOPAMAX) 100 mg tablet Take 1 Tab by mouth daily. 90 Tab 3   ??? gemfibrozil (LOPID) 600 mg tablet TAKE 1 TABLET BY MOUTH TWICE DAILY 180 Tab 3   ??? prochlorperazine (COMPAZINE) 10 mg tablet Take 0.5 Tabs by mouth every six (6) hours as needed. 30 Tab 5   ??? citalopram (CELEXA) 40 mg tablet Take 1 Tab by mouth daily. 90 Tab 3   ??? propranolol (  INDERAL) 20 mg tablet Take  by mouth three (3) times daily.     ??? metFORMIN (GLUCOPHAGE) 500 mg tablet Take  by mouth two (2) times daily (with meals).     ??? lactulose (CHRONULAC) 10 gram/15 mL solution Take  by mouth three (3) times daily.     ??? sucralfate (CARAFATE) 1 gram tablet Take 1 g by mouth four (4) times daily.     ??? oxyCODONE ER (OXYCONTIN) 20 mg ER tablet Take 20 mg by mouth every twelve (12) hours.     ??? tamsulosin (FLOMAX) 0.4 mg capsule Take 0.4 mg by mouth daily.     ??? cyclobenzaprine (FLEXERIL) 10 mg tablet Take  by mouth three (3) times daily as needed for Muscle Spasm(s).       Facility-Administered Medications Ordered in Other Visits   Medication Dose Route Frequency Provider Last Rate Last Dose   ??? [START ON 09/13/2016] iron sucrose (VENOFER) 100 mg iron/5 mL injection 200 mg  200 mg IntraVENous ONCE Dorien Chihuahua, MD         Allergies   Allergen Reactions   ??? Doxycycline Hives   ??? Doxycycline Rash   ??? Gabapentin Other (comments)     Per pt became violent    ??? Gabapentin Other (comments)     Slurred speech   ??? Lyrica [Pregabalin] Other (comments)     Per pt stroke symptoms   ??? Lyrica [Pregabalin] Anxiety   ??? Penicillin G Rash   ??? Penicillins Hives     Family History    Problem Relation Age of Onset   ??? Alzheimer Mother    ??? Diabetes Mother    ??? Cancer Mother      colon cancer    ??? Hypertension Father    ??? Heart Disease Father    ??? Cancer Father      prostate, lung cancer    ??? Diabetes Sister    ??? Heart Disease Sister    ??? Diabetes Brother      Social History   Substance Use Topics   ??? Smoking status: Former Smoker   ??? Smokeless tobacco: Never Used      Comment: quit years ago   ??? Alcohol use No     Patient Active Problem List   Diagnosis Code   ??? Chronic anemia D64.9   ??? Controlled type 2 diabetes mellitus without complication, without long-term current use of insulin ([place]) E11.9   ??? Essential hypertension I10   ??? Hyperlipidemia E78.5   ??? History of stroke Z86.73   ??? Anxiety F41.9   ??? Chronic pain G89.29   ??? Iron deficiency anemia D50.9   ??? 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 Other cirrhosis of liver (HCC) K74.69   ??? Depression F32.9   ??? Type II diabetes mellitus (HCC) E11.9   ??? HTN (hypertension) I10   ??? 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 Stroke (HCC) I63.9   ??? Compressed vertebrae (HCC) G95.20   ??? Chronic back pain greater than 3 months duration M54.9, G89.29   ??? Hyperlipemia E78.5   ??? Anxiety F41.9   ??? 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  ?? Little interest or pleasure in doing things?  ?? Not at all. [0]  ?? Feeling down, depressed, or hopeless?   ?? Not at all. [0]    Total Score: 0/6  PHQ-2 Assessment Scoring:   A score of 2 or more requires further screening with the PHQ-9    Alcohol Risk Factor Screening:      Women: On any occasion during the past 3 months, have you had more than 3 drinks containing alcohol?   Do you average more than 7 drinks per week?  Men: On any occasion during the past 3 months, have you had more than 4 drinks containing alcohol?  Do you average more than 14 drinks per week?    Functional Ability and Level of Safety:     Hearing Loss    Hearing is good. The patient needs further evaluation.    Activities of Daily Living   Self-care.   Requires assistance with: no ADLs    Fall Risk   No fall risk factors    Abuse Screen   Patient is not abused  None    Examination   Physical Examination  Vitals:    09/10/16 1305   BP: 119/61   Pulse: 67   Resp: 16   Temp: 97.2 ??F (36.2 ??C)   TempSrc: Oral   SpO2: 98%   Weight: 141 lb (64 kg)   Height: 5' 9.02" (1.753 m)   PainSc:   6   PainLoc: Head      Body mass index is 20.81 kg/(m^2).     Evaluation of Cognitive Function:  Mood/affect: Normal  Appearance well under no distress  Family member/caregiver input: His wife is very good historian and she is telling all the details about his medication and his illness and follow-up.    alert, well appearing, and in no distress, oriented to person, place, and time, normal appearing weight, acyanotic, in no respiratory distress and well hydrated    Patient Care Team:  [name], MD as PCP - General (Internal Medicine)  Lenice Pressman, MD (Family Practice)    End-of-life planning  Advanced Directive in the case than an injury or illness causes the patient to be unable to make health care decisions    Health Care Directive or Living Will: no and     Advice/Referrals/Counselling/Plan:   Education and counseling provided:  Are appropriate based on today's review and evaluation  End-of-Life planning (with patient's consent)  Pneumococcal Vaccine  Include in education list (weight loss, physical activity, smoking cessation, fall prevention, and nutrition)    ICD-10-CM ICD-9-CM     1. Medicare annual wellness visit, subsequent Z00.00 V70.0    2. Chronic back pain greater than 3 months duration M54.9 724.5     G89.29 338.29    3. Essential hypertension I10 401.9    4. Controlled type 2 diabetes mellitus without complication, without long-term current use of insulin (HCC) E11.9 250.00 REFERRAL TO OPHTHALMOLOGY   5. Other cirrhosis of liver (HCC) K74.69 571.5    6. Anxiety F41.9 300.00 nortriptyline (PAMELOR) 25 mg capsule   7. Constipation, unspecified constipation type K59.00 564.00    8. Epigastric pain R10.13 789.06 XR ABD FLAT/ ERECT      CBC WITH AUTOMATED DIFF      METABOLIC  PANEL, COMPREHENSIVE      pantoprazole (PROTONIX) 40 mg tablet   9. Screening for colon cancer Z12.11 V76.51 OCCULT BLOOD, IMMUNOASSAY (FIT)   10. Screening for prostate cancer Z12.5 V76.44 PSA SCREENING (SCREENING)   11. Other depression F32.89 311 nortriptyline (PAMELOR) 25 mg capsule     Encounter Diagnoses   Name Primary?   ??? Medicare annual wellness visit, subsequent Yes   ??? Chronic back pain greater than 3 months duration    ??? Essential hypertension    ??? Controlled type 2 diabetes mellitus without complication, without long-term current use of insulin ([place])    ??? Other cirrhosis of liver ([place])    ??? Anxiety    ??? Constipation, unspecified constipation type    ??? Epigastric pain    ??? Screening for colon cancer    ??? Screening for prostate cancer    ??? Other depression      Orders Placed This Encounter   ??? XR ABD FLAT/ ERECT   ??? CBC WITH AUTOMATED DIFF   ??? METABOLIC PANEL, COMPREHENSIVE   ??? PSA - SCREENING (H0623)   ??? OCCULT BLOOD, IMMUNOASSAY (FIT)   ??? REFERRAL TO OPHTHALMOLOGY   ??? nortriptyline (PAMELOR) 25 mg capsule   ??? pantoprazole (PROTONIX) 40 mg tablet   .  Brief written plan, checklist    I have discussed the diagnosis with the patient and the intended plan as seen in the above orders.  The patient has received an after-visit summary and questions were answered concerning future plans.  I have discussed  medication side effects and warnings with the patient as well.I have reviewed the plan of care with the patient, accepted their input and they are in agreement with the treatment goals.         Follow-up Disposition:  Return if symptoms worsen or fail to improve or go tpo ER.      ____________________________________________________________    Problem Assessment    for treatment of   Chief Complaint   Patient presents with   ??? Annual Wellness Visit   ??? Hypertension   ??? Diabetes         SUBJECTIVE    Abdominal Pain  Patient complains of abdominal pain. The pain is described as cramping and colicky, and is 7/62 in intensity. Pain is located in the epigastric without radiation. Onset was 1 day ago. Symptoms have been unchanged since. Aggravating factors: none.  Alleviating factors: none. Associated symptoms: vomiting. The patient denies fever, headache and melena.    Nausea / Vomiting  Patient complains of nausea and vomiting. Onset of symptoms was 1 day ago. Patient describes nausea as moderate. Vomiting has occurred 1 times over the past day. Vomitus is described as undigested food. Symptoms have been associated with abdominal pain of moderate severity.  Patient denies hematemesis, melena. Course to date has been gradually worsening and intermittent.  Evaluation to date has been KUB: normal. Treatment to date has been none.     Diabetes Mellitus:  He has diabetes mellitus, and  hyperlipidemia.  Diabetic ROS - medication compliance: compliant most of the time, diabetic diet compliance: compliant most of the time, last eye exam approximately 2 years  ago.   Lab review: labs are reviewed, up to date and normal.   Depression Review:  Patient is seen for followup of depression. Treatment includes neuroleptics, Celexa and no other therapies.   On going symptoms include Non he is not depressed no suicidal idoation .     He  experiences the following side effects from the treatment: none.  Anxiety Review:   Patient is seen for anxiety disorder. Current treatment includes SSRI, neuroleptics and no other therapies.   Ongoing symptoms include: insomnia.   Patient denies: chest pain, shortness of breath, dizziness, feelings of losing control, suicidal ideation, homocidal ideation.   Reported side effects from the treatment: None .      Additional Concerns:      Visit Vitals   ??? BP 119/61   ??? Pulse 67   ??? Temp 97.2 ??F (36.2 ??C) (Oral)   ??? Resp 16   ??? Ht 5' 9.02" (1.753 m)   ??? Wt 141 lb (64 kg)   ??? SpO2 98%   ??? BMI 20.81 kg/m2     General:  Alert, cooperative, no distress, appears stated age.   Head:  Normocephalic, without obvious abnormality, atraumatic.   Eyes:  Conjunctivae/corneas clear. PERRL, EOMs intact. Fundi benign   Ears:  Normal TMs and external ear canals both ears.   Nose: Nares normal. Septum midline. Mucosa normal. No drainage or sinus tenderness.   Throat: Lips, mucosa, and tongue normal. Teeth and gums normal.   Neck: Supple, symmetrical, trachea midline, no adenopathy, thyroid: no enlargement/tenderness/nodules, no carotid bruit and no JVD.   Back:   Symmetric, no curvature. ROM normal. No CVA tenderness.   Lungs:   Clear to auscultation bilaterally.   Chest wall:  No tenderness or deformity.   Heart:  Regular rate and rhythm, S1, S2 normal, no murmur, click, rub or gallop.   Abdomen:   Soft, non-tender. Bowel sounds normal. No masses,  No organomegaly.   Genitalia:  Normal male without lesion, discharge or tenderness.   Rectal:  Normal tone, normal prostate, no masses or tenderness  Guaiac negative stool.   Extremities: Extremities normal, atraumatic, no cyanosis or edema.   Pulses: 2+ and symmetric all extremities.   Skin: Skin color, texture, turgor normal. No rashes or lesions   Lymph nodes: Cervical, supraclavicular, and axillary nodes normal.   Neurologic: CNII-XII intact. Normal strength, sensation and reflexes throughout.       General appearance - alert, well appearing, and in no distress   Mental status - alert, oriented to person, place, and time, normal mood, behavior, speech, dress, motor activity, and thought processes  Eyes - pupils equal and reactive, extraocular eye movements intact, sclera anicteric  Ears - bilateral TM's and external ear canals normal, not examined  Nose - normal and patent, no erythema, discharge or polyps  Mouth - mucous membranes moist, pharynx normal without lesions  Neck - supple, no significant adenopathy, neck mass noted none   Lymphatics - no palpable lymphadenopathy  Chest - clear to auscultation, no wheezes, rales or rhonchi, symmetric air entry, no tachypnea, retractions or cyanosis  Heart - normal rate, regular rhythm, normal S1, S2, no murmurs, rubs, clicks or gallops  Abdomen - tenderness noted in the epigastric area with deep palpation with guarding no rebound tenderness   Back exam - full range of motion, no tenderness, palpable spasm or pain on motion, limited range of motion, pain with motion noted during exam  Neurological - alert, oriented, normal speech, no focal findings or movement disorder noted, screening mental status exam normal, neck supple without rigidity, motor and sensory grossly normal bilaterally  Musculoskeletal - no joint tenderness, deformity or swelling, no muscular tenderness noted  Extremities - peripheral pulses normal, no pedal edema, no clubbing or cyanosis, no pedal edema noted  LABS     TESTS      Assessment/Plan:      Diabetes - well controlled    Diagnoses and all orders for this visit:    1. Medicare annual wellness visit, subsequent    2. Chronic back pain greater than 3 months duration    3. Essential hypertension  Blood pressure is well controlled patient only is taking a propranolol that for esophageal varices as well , his wife blood brought the blood pressure that she has been taking at home it in the range of 115/60  4. Controlled type 2 diabetes mellitus without complication, without  long-term current use of insulin ([place])  -     REFERRAL TO OPHTHALMOLOGY  Diabetes well controlled on metformin  5. Other cirrhosis of liver ([place])  Patient was diagnosed with nonalcoholic liver disease his AST ALT has been normal  6. Anxiety  -     nortriptyline (PAMELOR) 25 mg capsule; Take 1 Cap by mouth nightly.    Patient is taking Celexa as well as nortriptyline, he has been very anxious to get off nortriptyline we will decrease the dose from 50-25 and gradually patient can be tapered off if he could tolerate to tapering without his anxiety symptoms are caring    7. Constipation, unspecified constipation type  Well-controlled with the current medication  8. Epigastric pain  -     XR ABD FLAT/ ERECT; Future  -     CBC WITH AUTOMATED DIFF; Future  -     METABOLIC PANEL, COMPREHENSIVE; Future  -     pantoprazole (PROTONIX) 40 mg tablet; Take 1 Tab by mouth daily.    X-ray KUB showed normal gas pattern possible nonmechanical ileus that is no signs of perforation by x-ray.  Patient and spouse were advised to follow-up either in the office or in the emergency room if the symptoms does not get better  9. Screening for colon cancer  -     OCCULT BLOOD, IMMUNOASSAY (FIT); Future    10. Screening for prostate cancer  -     PSA - SCREENING (G0103); Future    11. Other depression  -     nortriptyline (PAMELOR) 25 mg capsule; Take 1 Cap by mouth nightly.          Lab review: most recent lipid panel reviewed, showing liver functions are mildly abnormal, but stable

## 2016-09-11 LAB — CBC WITH AUTOMATED DIFF
ABS. BASOPHILS: 0 10*3/uL (ref 0.0–0.06)
ABS. EOSINOPHILS: 0.3 10*3/uL (ref 0.0–0.4)
ABS. LYMPHOCYTES: 0.5 10*3/uL — ABNORMAL LOW (ref 0.9–3.6)
ABS. MONOCYTES: 0.2 10*3/uL (ref 0.05–1.2)
ABS. NEUTROPHILS: 2.6 10*3/uL (ref 1.8–8.0)
BASOPHILS: 1 % (ref 0–2)
EOSINOPHILS: 7 % — ABNORMAL HIGH (ref 0–5)
HCT: 33.9 % — ABNORMAL LOW (ref 36.0–48.0)
HGB: 10.1 g/dL — ABNORMAL LOW (ref 13.0–16.0)
LYMPHOCYTES: 15 % — ABNORMAL LOW (ref 21–52)
MCH: 27.8 PG (ref 24.0–34.0)
MCHC: 29.8 g/dL — ABNORMAL LOW (ref 31.0–37.0)
MCV: 93.4 FL (ref 74.0–97.0)
MONOCYTES: 6 % (ref 3–10)
MPV: 11 FL (ref 9.2–11.8)
NEUTROPHILS: 71 % (ref 40–73)
PLATELET: 105 10*3/uL — ABNORMAL LOW (ref 135–420)
RBC: 3.63 M/uL — ABNORMAL LOW (ref 4.70–5.50)
RDW: 18.8 % — ABNORMAL HIGH (ref 11.6–14.5)
WBC: 3.6 10*3/uL — ABNORMAL LOW (ref 4.6–13.2)

## 2016-09-11 LAB — METABOLIC PANEL, COMPREHENSIVE
A-G Ratio: 1.3 (ref 0.8–1.7)
ALT (SGPT): 28 U/L (ref 16–61)
AST (SGOT): 34 U/L (ref 15–37)
Albumin: 4.4 g/dL (ref 3.4–5.0)
Alk. phosphatase: 174 U/L — ABNORMAL HIGH (ref 45–117)
Anion gap: 12 mmol/L (ref 3.0–18)
BUN/Creatinine ratio: 16 (ref 12–20)
BUN: 17 MG/DL (ref 7.0–18)
Bilirubin, total: 0.6 MG/DL (ref 0.2–1.0)
CO2: 23 mmol/L (ref 21–32)
Calcium: 9.3 MG/DL (ref 8.5–10.1)
Chloride: 105 mmol/L (ref 100–108)
Creatinine: 1.08 MG/DL (ref 0.6–1.3)
GFR est AA: >60 mL/min/{1.73_m2} (ref >60)
GFR est non-AA: >60 mL/min/{1.73_m2} (ref >60)
Globulin: 3.5 g/dL (ref 2.0–4.0)
Glucose: 136 mg/dL — ABNORMAL HIGH (ref 74–99)
Potassium: 4.3 mmol/L (ref 3.5–5.5)
Protein, total: 7.9 g/dL (ref 6.4–8.2)
Sodium: 140 mmol/L (ref 136–145)

## 2016-09-11 LAB — PSA SCREENING (SCREENING): Prostate Specific Ag: 1.2 ng/mL (ref 0.0–4.0)

## 2016-09-13 ENCOUNTER — Encounter: Attending: Medical Oncology | Primary: Legal Medicine

## 2016-09-13 ENCOUNTER — Inpatient Hospital Stay: Admit: 2016-09-13 | Payer: MEDICARE | Primary: Legal Medicine

## 2016-09-13 MED ORDER — SODIUM CHLORIDE 0.9 % IJ SYRG
INTRAMUSCULAR | Status: DC | PRN
Start: 2016-09-13 — End: 2016-09-17
  Administered 2016-09-13 (×2): via INTRAVENOUS

## 2016-09-13 MED FILL — BD POSIFLUSH NORMAL SALINE 0.9 % INJECTION SYRINGE: INTRAMUSCULAR | Qty: 40

## 2016-09-13 NOTE — Progress Notes (Signed)
[facility] Progress Note    Date: September 13, 2016    Name: William [facility] Sr.    MRN: [id number]         DOB: 18-May-1946      [name] arrived in the [facility] today, at 1110, in stable condition, here for Dose # 4 of 5, Weekly IV Venofer. He was assessed and education was provided.     [name] vitals were reviewed.  Visit Vitals   ??? BP 101/60 (BP 1 Location: Right arm, BP Patient Position: At rest;Sitting)   ??? Pulse 70   ??? Temp 97 ??F (36.1 ??C)   ??? Resp 20             PIV was established in his right AC at 1130, without incident.   ??  ??  ??  Venofer 200 mg IV, was administered over 5 minutes, per order, and without incident.   ??  ??  After completion of the IV Venofer, the PIV was flushed well with 20 ml NS, and then, the PIV was removed and gauze/bandaid was applied.   ??  ??  [name] politely refused to stay for the 30 minute post Venofer monitoring period.              [name] tolerated well, and had no complaints.    [name] was discharged from [facility] in stable condition at 1155.Marland Kitchen He is to return on next Tuesday, 09-21-16,  at 1100,  for his next appointment, for dose # 5 of 5, IV Venofer.     [name], RN  September 13, 2016  11:27 AM

## 2016-09-15 MED ORDER — IRON SUCROSE 100 MG/5 ML IV SOLN
100 mg iron/5 mL | Freq: Once | INTRAVENOUS | Status: AC
Start: 2016-09-15 — End: 2016-09-21
  Administered 2016-09-21: 15:00:00 via INTRAVENOUS

## 2016-09-15 MED FILL — VENOFER 100 MG IRON/5 ML INTRAVENOUS SOLUTION: 100 mg iron/5 mL | INTRAVENOUS | Qty: 10

## 2016-09-20 ENCOUNTER — Encounter: Payer: MEDICARE | Primary: Legal Medicine

## 2016-09-21 ENCOUNTER — Inpatient Hospital Stay: Admit: 2016-09-21 | Payer: MEDICARE | Primary: Legal Medicine

## 2016-09-21 ENCOUNTER — Encounter
Admit: 2016-09-21 | Discharge: 2016-09-21 | Payer: PRIVATE HEALTH INSURANCE | Attending: Hematology & Oncology | Primary: Legal Medicine

## 2016-09-21 MED ORDER — SODIUM CHLORIDE 0.9 % IJ SYRG
INTRAMUSCULAR | Status: DC | PRN
Start: 2016-09-21 — End: 2016-09-25
  Administered 2016-09-21 (×2): via INTRAVENOUS

## 2016-09-21 MED FILL — BD POSIFLUSH NORMAL SALINE 0.9 % INJECTION SYRINGE: INTRAMUSCULAR | Qty: 40

## 2016-09-21 MED FILL — VENOFER 100 MG IRON/5 ML INTRAVENOUS SOLUTION: 100 mg iron/5 mL | INTRAVENOUS | Qty: 10

## 2016-09-21 NOTE — Progress Notes (Signed)
[facility] Progress Note    Date: September 21, 2016    Name: Eberardo Demello [facility] Sr.    MRN: [id number]         DOB: February 19, 1946     Venofer IVP 5 of 5      Mr. Mitcham was assessed and education was provided.     Mr. Sprung vitals were reviewed and patient was observed for 5 minutes prior to treatment.   Visit Vitals   ??? BP 98/61   ??? Pulse 72   ??? Temp 98 ??F (36.7 ??C)   ??? Resp 18   ??? SpO2 100%       No pre-medications were ordered.     IV started in back of right [facility] w/24 gauge INT w/o difficulty.  Good blood return obtained, then flushed with 10 ml NS.    Venofer 200 mg was infused  By slow IVP over 6 minutes, followed by 10 ml NS flush. IV removed, no irritation noted at site, 2x2 guaze and Coban applied.  Patient declined to stay for observation.  States had no problems with last infusion.  No s/s reaction noted.    Mr. Ewings tolerated the infusion, and had no complaints.  Patient armband removed and shredded.    Mr. Strohm was discharged from [place] in stable condition at 1115. He has no future appointments with [facility] at this time.    Alean Rinne  September 21, 2016  12:52 PM

## 2016-09-23 ENCOUNTER — Inpatient Hospital Stay: Admit: 2016-09-23 | Payer: MEDICARE | Primary: Legal Medicine

## 2016-09-23 ENCOUNTER — Inpatient Hospital Stay: Admit: 2016-09-23 | Primary: Legal Medicine

## 2016-09-23 ENCOUNTER — Ambulatory Visit
Admit: 2016-09-23 | Discharge: 2016-09-24 | Payer: PRIVATE HEALTH INSURANCE | Attending: Hematology & Oncology | Primary: Legal Medicine

## 2016-09-23 DIAGNOSIS — D508 Other iron deficiency anemias: Secondary | ICD-10-CM

## 2016-09-23 LAB — CBC WITH 3 PART DIFF
ABS. LYMPHOCYTES: 0.7 10*3/uL — ABNORMAL LOW (ref 1.1–5.9)
ABS. MIXED CELLS: 0.3 10*3/uL (ref 0.0–2.3)
ABS. NEUTROPHILS: 3.2 10*3/uL (ref 1.8–9.5)
HCT: 36.3 % (ref 36–48)
HGB: 11.2 g/dL — ABNORMAL LOW (ref 12.0–16.0)
LYMPHOCYTES: 17 % (ref 14–44)
MCH: 28.6 PG (ref 25.0–35.0)
MCHC: 30.9 g/dL — ABNORMAL LOW (ref 31–37)
MCV: 92.6 FL (ref 78–102)
Mixed cells: 7 % (ref 0.1–17)
NEUTROPHILS: 76 % — ABNORMAL HIGH (ref 40–70)
PLATELET: 96 10*3/uL — ABNORMAL LOW (ref 140–440)
RBC: 3.92 M/uL — ABNORMAL LOW (ref 4.10–5.10)
RDW: 18.7 % — ABNORMAL HIGH (ref 11.5–14.5)
WBC: 4.2 10*3/uL — ABNORMAL LOW (ref 4.5–13.0)

## 2016-09-23 NOTE — Progress Notes (Signed)
Labs reviewed and noted. Will continue to monitor.

## 2016-09-23 NOTE — Progress Notes (Signed)
Hematology/Oncology Consultation Note    Name: William Durbin Sr.  Date: 09/23/2016  DOB: 1945/09/23    PCP: [name], MD       William Blanchard  is a 71 y.o. is a 71 year old man who has chronic iron deficiency anemia and has recently completed 5 doses of infusional iron therapy.    Subjective:   Chief complaint iron deficiency anemia and weakness    History of present illness:  William Blanchard 71 year old man who has a history of iron deficiency anemia secondary to chronic blood loss.  In November 2017 he received IV iron replenishment therapy.  He did reasonably well.  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  The patient is here today for follow-up visit.  I have informed the patient that on 08/16/2016 he had a ferritin level of 8 ng/mL with an iron saturation of 5% and an iron level of 25 mcg/dL.  On reviewing his CBC from 08/16/2016 he had a platelet count of 85,000 and WBC count of 2.8 at a time of his hemoglobin was 9 g/dL and hematocrit was 29.5.  The CBC dated 09/10/2016 showed persistent leukopenia with a WBC count 3.6 and a platelet count of 105,000.  The hemoglobin at the time was 10.1 g/dL with hematocrit of 33.9.  The patient reports that he has never been evaluated for his leukopenia or thrombocytopenia.  He continues to experience some weakness and fatigue.  Past Medical History:   Diagnosis Date   ??? Anemia    ??? Anxiety    ??? Chronic pain    ??? Cirrhosis of liver ([place])    ??? Diabetes ([place])    ??? GERD (gastroesophageal reflux disease)    ??? Hyperlipemia    ??? Hypertension    ??? Hypotension    ??? Migraine    ??? Other cirrhosis of liver ([place])    ??? Stroke [facility])     had 3 strokes        Allergies   Allergen Reactions   ??? Doxycycline Hives   ??? Doxycycline Rash   ??? Gabapentin Other (comments)     Per pt became violent    ??? Gabapentin Other (comments)     Slurred speech   ??? Lyrica [Pregabalin] Other (comments)     Per pt stroke symptoms   ??? Lyrica [Pregabalin] Anxiety    ??? Penicillin G Rash   ??? Penicillins Hives       Past Surgical History:   Procedure Laterality Date   ??? HX CHOLECYSTECTOMY     ??? HX ORTHOPAEDIC      R hand sx   ??? HX OTHER SURGICAL      Hand surgery- Nail gun went through finger       Social History     Social History   ??? Marital status: MARRIED     Spouse name: William Blanchard   ??? Number of children: William Blanchard   ??? Years of education: William Blanchard     Occupational History   ??? Not on file.     Social History Main Topics   ??? Smoking status: Former Smoker   ??? Smokeless tobacco: Never Used      Comment: quit years ago   ??? Alcohol use No   ??? Drug use: No   ??? Sexual activity: Yes     Partners: Female, Male     Other Topics Concern   ??? Not on file     Social History  Narrative    ** Merged History Encounter **            Family History   Problem Relation Age of Onset   ??? Alzheimer Mother    ??? Diabetes Mother    ??? Cancer Mother      colon cancer    ??? Hypertension Father    ??? Heart Disease Father    ??? Cancer Father      prostate, lung cancer    ??? Diabetes Sister    ??? Heart Disease Sister    ??? Diabetes Brother        Current Outpatient Prescriptions   Medication Sig Dispense Refill   ??? nortriptyline (PAMELOR) 25 mg capsule Take 1 Cap by mouth nightly. 30 Cap 1   ??? pantoprazole (PROTONIX) 40 mg tablet Take 1 Tab by mouth daily. 30 Tab 0   ??? sucralfate (CARAFATE) 1 gram tablet TAKE 1 TABLET BY MOUTH FOUR TIMES DAILY WITH MEALS AND AT BEDTIME 120 Tab 0   ??? LORazepam (ATIVAN) 1 mg tablet Take  by mouth every four (4) hours as needed for Anxiety.     ??? aspirin 81 mg chewable tablet Take 81 mg by mouth daily.     ??? codeine-butalbital-acetaminophen-caffeine (FIORICET WITH CODEINE) 50-325-40-30 mg capsule Take  by mouth.     ??? MULTIVIT-MIN/FA/LYCOPEN/LUTEIN (CENTRUM SILVER MEN PO) Take  by mouth.     ??? rosuvastatin (CRESTOR) 10 mg tablet Take 10 mg by mouth nightly.     ??? cholecalciferol, vitamin D3, (VITAMIN D3) 2,000 unit tab Take  by mouth.      ??? B.infantis-B.ani-B.long-B.bifi (PROBIOTIC 4X) 10-15 mg TbEC Take  by mouth.     ??? triamcinolone acetonide (KENALOG) 0.1 % topical cream Apply  to affected area two (2) times a day. use thin layer 453.6 g 0   ??? topiramate (TOPAMAX) 100 mg tablet Take 1 Tab by mouth daily. 90 Tab 3   ??? gemfibrozil (LOPID) 600 mg tablet TAKE 1 TABLET BY MOUTH TWICE DAILY 180 Tab 3   ??? prochlorperazine (COMPAZINE) 10 mg tablet Take 0.5 Tabs by mouth every six (6) hours as needed. 30 Tab 5   ??? citalopram (CELEXA) 40 mg tablet Take 1 Tab by mouth daily. 90 Tab 3   ??? propranolol (INDERAL) 20 mg tablet Take  by mouth three (3) times daily.     ??? metFORMIN (GLUCOPHAGE) 500 mg tablet Take  by mouth two (2) times daily (with meals).     ??? lactulose (CHRONULAC) 10 gram/15 mL solution Take  by mouth three (3) times daily.     ??? sucralfate (CARAFATE) 1 gram tablet Take 1 g by mouth four (4) times daily.     ??? oxyCODONE ER (OXYCONTIN) 20 mg ER tablet Take 20 mg by mouth every twelve (12) hours.     ??? tamsulosin (FLOMAX) 0.4 mg capsule Take 0.4 mg by mouth daily.     ??? cyclobenzaprine (FLEXERIL) 10 mg tablet Take  by mouth three (3) times daily as needed for Muscle Spasm(s).       Facility-Administered Medications Ordered in Other Visits   Medication Dose Route Frequency Provider Last Rate Last Dose   ??? sodium chloride (NS) flush 10-40 mL  10-40 mL IntraVENous PRN Suzy Bouchard, MD   10 mL at 09/21/16 1112     Review of Systems    General ROS:The patient has complaints of some fatigue and weakness.  Psychological ROS: patient denies having any psychological symptoms  such as hallucinations, depression or anxiety.  Ophthalmic ROS:the patient denies having any visual impairment or eye discomfort.  ENT ROS: there are no abnormalities reported.  Allergy and Immunology ROS:the patient denies having any seasonal allergies or allergies to medications other than those already outlined above.   Hematological and Lymphatic ROS: the patient denies having any bruising, bleeding or lymphadenopathy.  Endocrine ROS: the patient denies having any heat or cold intolerance.  There is no history of diabetes or thyroid disorders.  Breast ROS: the patient denies having any history of breast mass, nipple discharge, or lumps.  Respiratory ROS:the patient denies having any cough, shortness of breath, or dyspnea on exertion.  Cardiovascular ROS: there are no complaints of chest pain, palpitations, chest pounding, or dyspnea on exertion.  Gastrointestinal ROS: the patient denies having nausea, emesis, diarrhea, constipation, or blood in the stool.  Genito-Urinary ROS: the patient denies having urinary urgency, frequency, or dysuria.  Musculoskeletal ROS: with the exception of mild arthralgias the patient has no other musculoskeletal complaints.  Neurological ROS: the patient denies having any numbness, tingling, or neurologic deficits.  Dermatological RWE:RXVQMGQ denies having any unexplained rash, skin ulcerations, or hives.      Objective:     Visit Vitals   ??? BP 95/59   ??? Pulse 87   ??? Temp 97.7 ??F (36.5 ??C) (Oral)   ??? Wt 61.2 kg (135 lb)   ??? BMI 19.93 kg/m2        Physical Exam:   Gen. Appearance: the patient is in no acute distress.  Skin: There is no evidence of bruise or rash.  HEENT: The head is normocephalic and atraumatic.  The conjunctiva and sclera are clear.  Pupils are equal, round, reactive to light, and accommodation.  The extraocular movements are intact.  ENT reveals no oral mucosal lesions or ulcerations.  Neck: Supple without lymphadenopathy or thyromegaly.  Lungs: Clear to auscultation and percussion; there are no wheezes or rhonchi.  Heart: Regular rate and rhythm; there are no murmurs, gallops, or rubs.  Abdomen: Bowel sounds are present and normal.  There is no guarding, tenderness, or hepatosplenomegaly. Extremities: There is no clubbing, cyanosis, or edema.  Neurologic: There are no focal neurologic deficits.  Lymphatics: There is no palpable peripheral lymphadenopathy.    Lab data:  The CBC dated 09/10/2016 showed a WBC count of 3.6, hemoglobin 10.1 g/dL, hematocrit 33.9%, and the platelet count was 105,000.    The CBC dated 08/16/2016 showed a WBC count of 2.8, hemoglobin 9 g/dL, hematocrit 29.5%, and the platelet count was 85,000.  Iron studies show ferritin level of 8 ng/mL, and iron saturation 5%, and an iron level of 25 mcg/dL.         Assessment:   Iron deficiency anemia: The patient recently completed infusion #5 of intravenous Venofer.    Leukopenia of unclear etiology: The patient reports that he was not aware that his WBC counts were chronically low.  I have explained to the patient that the diagnostic differential could include benign leukopenia versus an evolving lymphoproliferative for myeloproliferative disorder.    Thrombocytopenia: Etiology includes infiltrative bone marrow processes such as myelofibrosis, plasma cell dyscrasia, and myeloproliferative disorders.      Plan:   Iron deficiency anemia: At this time I will recheck his iron profile and ferritin levels.  His hemoglobin has improved to 10.1 g/dL from 9 g/dL that was documented in February 2018.    Chronic leukopenia: At this time and immunophenotyping profile will be ordered  along with a serum protein electrophoresis for further assessment.  The patient will also be scheduled for bone marrow biopsy because of his chronic leukopenia and unexplained thrombocytopenia.    Thrombocytopenia: I have explained the risk and benefit profile of the bone marrow biopsy procedure with the patient.  Tentatively we will  schedule the patient have a bone marrow biopsy completed within the next 1-2 weeks.    Follow-up on the day of bone marrow biopsy.    Orders Placed This Encounter   ??? COMPLETE CBC & AUTO DIFF WBC   ??? METABOLIC PANEL, COMPREHENSIVE     Standing Status:   Future     Number of Occurrences:   1      Standing Expiration Date:   09/24/2017   ??? IRON PROFILE     Standing Status:   Future     Number of Occurrences:   1     Standing Expiration Date:   09/24/2017   ??? FERRITIN     Standing Status:   Future     Number of Occurrences:   1     Standing Expiration Date:   09/24/2017   ??? SPEP     Standing Status:   Future     Number of Occurrences:   1     Standing Expiration Date:   09/24/2017   ??? IMMUNOPHENOTYPING PROFILE     Standing Status:   Future     Number of Occurrences:   1     Standing Expiration Date:   09/24/2017     Order Specific Question:   Specimen type     Answer:   Blood [2]   ??? InHouse CBC ([facility])     Standing Status:   Future     Number of Occurrences:   1     Standing Expiration Date:   09/30/2016           Joycelyn Das, MD  09/23/2016      Please note: This document has been produced using voice recognition software. Unrecognized errors in transcription may be present.

## 2016-09-24 LAB — METABOLIC PANEL, COMPREHENSIVE
A-G Ratio: 1.3 (ref 0.8–1.7)
ALT (SGPT): 30 U/L (ref 16–61)
AST (SGOT): 36 U/L (ref 15–37)
Albumin: 4.6 g/dL (ref 3.4–5.0)
Alk. phosphatase: 185 U/L — ABNORMAL HIGH (ref 45–117)
Anion gap: 12 mmol/L (ref 3.0–18)
BUN/Creatinine ratio: 11 — ABNORMAL LOW (ref 12–20)
BUN: 14 MG/DL (ref 7.0–18)
Bilirubin, total: 0.6 MG/DL (ref 0.2–1.0)
CO2: 20 mmol/L — ABNORMAL LOW (ref 21–32)
Calcium: 9.7 MG/DL (ref 8.5–10.1)
Chloride: 105 mmol/L (ref 100–108)
Creatinine: 1.22 MG/DL (ref 0.6–1.3)
GFR est AA: >60 mL/min/{1.73_m2} (ref >60)
GFR est non-AA: 59 mL/min/{1.73_m2} — ABNORMAL LOW (ref >60)
Globulin: 3.6 g/dL (ref 2.0–4.0)
Glucose: 91 mg/dL (ref 74–99)
Potassium: 4.2 mmol/L (ref 3.5–5.5)
Protein, total: 8.2 g/dL (ref 6.4–8.2)
Sodium: 137 mmol/L (ref 136–145)

## 2016-09-24 LAB — IMMUNOPHENOTYPING PROFILE

## 2016-09-24 LAB — IRON PROFILE
Iron % saturation: 16 %
Iron: 74 ug/dL (ref 50–175)
TIBC: 461 ug/dL — ABNORMAL HIGH (ref 250–450)

## 2016-09-24 LAB — FERRITIN: Ferritin: 219 NG/ML (ref 8–388)

## 2016-09-27 LAB — PROTEIN ELECTROPHORESIS
A/G ratio: 1.4 (ref 0.7–1.7)
ALPHA-2 GLOBULIN: 0.8 g/dL (ref 0.4–1.0)
Albumin: 4.6 g/dL — ABNORMAL HIGH (ref 2.9–4.4)
Alpha-1-globulin: 0.3 g/dL (ref 0.0–0.4)
Beta globulin: 1.2 g/dL (ref 0.7–1.3)
Gamma globulin: 1.1 g/dL (ref 0.4–1.8)
Globulin, total: 3.4 g/dL (ref 2.2–3.9)
Protein, total: 8 g/dL (ref 6.0–8.5)

## 2016-09-28 MED ORDER — SUCRALFATE 1 GRAM TAB
1 gram | ORAL_TABLET | ORAL | 0 refills | Status: DC
Start: 2016-09-28 — End: 2016-10-25

## 2016-09-30 ENCOUNTER — Encounter: Attending: Hematology & Oncology | Primary: Legal Medicine

## 2016-10-07 ENCOUNTER — Inpatient Hospital Stay: Admit: 2016-10-07 | Primary: Legal Medicine

## 2016-10-07 ENCOUNTER — Ambulatory Visit
Admit: 2016-10-07 | Discharge: 2016-10-07 | Payer: PRIVATE HEALTH INSURANCE | Attending: Hematology & Oncology | Primary: Legal Medicine

## 2016-10-07 ENCOUNTER — Encounter

## 2016-10-07 DIAGNOSIS — D508 Other iron deficiency anemias: Secondary | ICD-10-CM

## 2016-10-07 LAB — CBC WITH 3 PART DIFF
ABS. LYMPHOCYTES: 0.5 10*3/uL — ABNORMAL LOW (ref 1.1–5.9)
ABS. MIXED CELLS: 0.3 10*3/uL (ref 0.0–2.3)
ABS. NEUTROPHILS: 2.5 10*3/uL (ref 1.8–9.5)
HCT: 32.8 % — ABNORMAL LOW (ref 36–48)
HGB: 9.9 g/dL — ABNORMAL LOW (ref 12.0–16.0)
LYMPHOCYTES: 14 % (ref 14–44)
MCH: 28.3 PG (ref 25.0–35.0)
MCHC: 30.2 g/dL — ABNORMAL LOW (ref 31–37)
MCV: 93.7 FL (ref 78–102)
Mixed cells: 8 % (ref 0.1–17)
NEUTROPHILS: 78 % — ABNORMAL HIGH (ref 40–70)
PLATELET: 90 10*3/uL — ABNORMAL LOW (ref 140–440)
RBC: 3.5 M/uL — ABNORMAL LOW (ref 4.10–5.10)
RDW: 18.2 % — ABNORMAL HIGH (ref 11.5–14.5)
WBC: 3.3 10*3/uL — ABNORMAL LOW (ref 4.5–13.0)

## 2016-10-07 NOTE — Progress Notes (Signed)
Phone: [phone]     Patient: William Arganbright Sr.  Gender: male   MRN: [id number]    Date of Birth: 1945-10-30 Age: 71 y.o.   CSN: [id number]    LOS: '@RRHLOS'$ @  Procedure Date: 10/07/2016    Bone marrow biopsy and aspirate Procedure note.    Diagnoses/indication for the procedure: Thrombocytopenia, chronic leukopenia, and anemia    The patient consented to undergo a bone marrow biopsy as part of the diagnostic evaluation for thrombocytopenia, chronic leukopenia, and anemia.  The right posterior iliac crest was prepped in a sterile fashion.  Local anesthesia was achieved with 2% Xylocaine.  A sterile drape was applied.  A bone marrow biopsy was obtained without complications or bleeding.  A post procedure pressure bandage was applied.  The specimen was sent to the pathology lab for analysis and the results are now pending.  The patient tolerated the procedure well without complications. A follow???up appointment, in the clinic, will occur in approximately 2 weeks to review test results.      Suzy Bouchard, MD ,Rosalita Chessman

## 2016-10-07 NOTE — Patient Instructions (Signed)
Iron Deficiency Anemia: Care Instructions  Your Care Instructions    Anemia means that you do not have enough red blood cells. Red blood cells carry oxygen around your body. When you have anemia, it can make you pale, weak, and tired.  Many things can cause anemia. The most common cause is loss of blood. This can happen if you have heavy menstrual periods. It can also happen if you have bleeding in your stomach or bowel.  You can also get anemia if you don't have enough iron in your diet or if it's hard for your body to absorb iron. In some cases, pregnancy causes anemia. That's because a pregnant woman needs more iron.  Your doctor may do more tests to find the cause of your anemia. If a disease or other health problem is causing it, your doctor will treat that problem.  It's important to follow up with your doctor to make sure that your iron level returns to normal.  Follow-up care is a key part of your treatment and safety. Be sure to make and go to all appointments, and call your doctor if you are having problems. It's also a good idea to know your test results and keep a list of the medicines you take.  How can you care for yourself at home?  ?? If your doctor recommended iron pills, take them as directed.  ?? Try to take the pills on an empty stomach. You can do this about 1 hour before or 2 hours after meals. But you may need to take iron with food to avoid an upset stomach.  ?? Do not take antacids or drink milk or anything with caffeine within 2 hours of when you take your iron. They can keep your body from absorbing the iron well.  ?? Vitamin C helps your body absorb iron. You may want to take iron pills with a glass of orange juice or some other food high in vitamin C.  ?? Iron pills may cause stomach problems. These include heartburn, nausea, diarrhea, constipation, and cramps. It can help to drink plenty of fluids and include fruits, vegetables, and fiber in your diet.   ?? It's normal for iron pills to make your stool a greenish or grayish black. But internal bleeding can also cause dark stool. So it's important to tell your doctor about any color changes.  ?? Call your doctor if you think you are having a problem with your iron pills. Even after you start to feel better, it will take several months for your body to build up its supply of iron.  ?? If you miss a pill, don't take a double dose.  ?? Keep iron pills out of the reach of small children. Too much iron can be very dangerous.  ?? Eat foods with a lot of iron. These include red meat, shellfish, poultry, and eggs. They also include beans, raisins, whole-grain bread, and leafy green vegetables.  ?? Steam your vegetables. This is the best way to prepare them if you want to get as much iron as possible.  ?? Be safe with medicines. Do not take nonsteroidal anti-inflammatory pain relievers unless your doctor tells you to. These include aspirin, naproxen (Aleve), and ibuprofen (Advil, Motrin).  ?? Liquid iron can stain your teeth. But you can mix it with water or juice and drink it with a straw. Then it won't get on your teeth.  When should you call for help?  Call 911 anytime you think you may   need emergency care. For example, call if:  ? ?? You passed out (lost consciousness).   ?Call your doctor now or seek immediate medical care if:  ? ?? You are short of breath.   ? ?? You are dizzy or light-headed, or you feel like you may faint.   ? ?? You have new or worse bleeding.   ?Watch closely for changes in your health, and be sure to contact your doctor if:  ? ?? You feel weaker or more tired than usual.   ? ?? You do not get better as expected.   Where can you learn more?  Go to [employer].  Enter [phone] in the search box to learn more about "Iron Deficiency Anemia: Care Instructions."  Current as of: April 10, 2015  Content Version: 11.4  ?? 2006-2017 [employer]. Care instructions adapted under  license by [employer] (which disclaims liability or warranty for this information). If you have questions about a medical condition or this instruction, always ask your healthcare professional. [place] any warranty or liability for your use of this information.       Thrombocytopenia: Care Instructions  Your Care Instructions    Thrombocytopenia is a low number of platelets in the blood. Platelets are the cells that help blood clot. If you don't have enough of them, your blood cannot clot well. So it is harder to stop bleeding.  You may have low platelets because your bone marrow does not make them. Or your body's defenses (immune system) may destroy them.  Having an enlarged spleen can also reduce the number of platelets in your blood. This is because they can get trapped in the enlarged spleen.  Some diseases or medicines may also cause low platelets. But platelets may go back to normal levels if the disease is treated or the medicine is stopped.  You may not need treatment if your problem is mild. If you do need treatment, you may have platelets added to your blood. Or you may get medicine to stop the loss of platelets or help your body make them.  Follow-up care is a key part of your treatment and safety. Be sure to make and go to all appointments, and call your doctor if you are having problems. It's also a good idea to know your test results and keep a list of the medicines you take.  How can you care for yourself at home?  ?? Be safe with medicines. Take your medicines exactly as prescribed. Call your doctor if you think you are having a problem with your medicine.  ?? Do not take aspirin or anti-inflammatory medicines unless your doctor says it is okay. Examples are ibuprofen (Advil, Motrin) and naproxen (Aleve). They may increase the risk of bleeding.  ?? Avoid contact sports or activities that could cause you to fall.  When should you call for help?   Call 911 anytime you think you may need emergency care. For example, call if:  ? ?? You passed out (lost consciousness).   ? ?? You have signs of severe bleeding, which includes:  ?? You have a severe headache that is different from past headaches.  ?? You vomit blood or what looks like coffee grounds.  ?? Your stools are maroon or very bloody.   ?Call your doctor now or seek immediate medical care if:  ? ?? You are dizzy or lightheaded, or you feel like you may faint.   ? ?? You have abnormal bleeding,  such as:  ?? Your stools are black and look like tar, or they have streaks of blood.  ?? You have blood in your urine.  ?? You have joint pain.  ?? You have bruises or blood spots under your skin.   ?Watch closely for changes in your health, and be sure to contact your doctor if:  ? ?? You do not get better as expected.   Where can you learn more?  Go to [employer].  Enter [phone] in the search box to learn more about "Thrombocytopenia: Care Instructions."  Current as of: April 10, 2015  Content Version: 11.4  ?? 2006-2017 [employer], [employer]. Care instructions adapted under license by [employer] (which disclaims liability or warranty for this information). If you have questions about a medical condition or this instruction, always ask your healthcare professional. [place] any warranty or liability for your use of this information.

## 2016-10-08 MED ORDER — PANTOPRAZOLE 40 MG TAB, DELAYED RELEASE
40 mg | ORAL_TABLET | ORAL | 0 refills | Status: DC
Start: 2016-10-08 — End: 2017-04-15

## 2016-10-12 ENCOUNTER — Encounter

## 2016-10-12 ENCOUNTER — Inpatient Hospital Stay: Admit: 2016-10-13 | Payer: PRIVATE HEALTH INSURANCE | Primary: Legal Medicine

## 2016-10-12 DIAGNOSIS — Z1211 Encounter for screening for malignant neoplasm of colon: Secondary | ICD-10-CM

## 2016-10-12 NOTE — Progress Notes (Signed)
Test is positive patient will need colonoscopy, referral will be put in the chart to Dr.Lee [facility] .

## 2016-10-16 LAB — OCCULT BLOOD IMMUNOASSAY,DIAGNOSTIC: Occult blood fecal, by IA: POSITIVE — AB

## 2016-10-18 NOTE — Addendum Note (Signed)
Addended by: [name] on: 10/18/2016 10:04 AM      Modules accepted: Orders

## 2016-10-19 NOTE — Telephone Encounter (Signed)
-----   Message from [name], MD sent at 10/18/2016  9:59 AM EDT -----  Test is positive patient will need colonoscopy, referral will be put in the chart to Dr.[facility] .

## 2016-10-19 NOTE — Telephone Encounter (Signed)
Patient was informed of result and referral has been placed

## 2016-10-20 ENCOUNTER — Ambulatory Visit
Admit: 2016-10-20 | Discharge: 2016-10-20 | Payer: PRIVATE HEALTH INSURANCE | Attending: Hematology & Oncology | Primary: Legal Medicine

## 2016-10-20 DIAGNOSIS — D508 Other iron deficiency anemias: Secondary | ICD-10-CM

## 2016-10-20 NOTE — Progress Notes (Signed)
Hematology/medical oncology progress note    10/20/2016  William Blanchard William Blanchard.  Date of birth: Apr 21, 1946    PCP: Dr. Carolyne Fiscal    Diagnosis: Myelodysplastic syndrome and chronic anemia    [name] 71 year old man who has a long-standing deficiency anemia, thrombocytopenia, and chronic leukopenia.  A recent bone marrow biopsy was completed on 10/07/2016.  I have explained to the patient that this test shows findings consistent with myelodysplastic syndrome.  His bone marrow exhibited erythroid hyperplasia, and dysmegakaryipoiesis.  I have explained to the patient that he will be a candidate for Procrit therapy at a dose of 60,000 units subcutaneous every 2 weeks if his hemoglobin declines below 10 g/dL with hematocrit below 30%.  His most recent hematocrit exceeded 32%.  We will monitor him at 4 week intervals in the future.  If there is a progressive decline and his WBC count and platelets he may eventually be a candidate for treatment with Vidaza.  The patient had his questions answered to her satisfaction.  I will see him back in clinic in 8 weeks and he will have a CBC done in 4 weeks.  Total time 30 minutes, greater than 50% of the time was in counseling and coordination of care.    Joycelyn Das, MD, PCP

## 2016-10-20 NOTE — Patient Instructions (Signed)
Anemia: Care Instructions  Your Care Instructions    Anemia is a low level of red blood cells, which carry oxygen throughout your body. Many things can cause anemia. Lack of iron is one of the most common causes. Your body needs iron to make hemoglobin, a substance in red blood cells that carries oxygen from the lungs to your body's cells. Without enough iron, the body produces fewer and smaller red blood cells. As a result, your body's cells do not get enough oxygen, and you feel tired and weak. And you may have trouble concentrating.  Bleeding is the most common cause of a lack of iron. You may have heavy menstrual bleeding or bleeding caused by conditions such as ulcers, hemorrhoids, or cancer. Regular use of aspirin or other anti-inflammatory medicines (such as ibuprofen) also can cause bleeding in some people. A lack of iron in your diet also can cause anemia, especially at times when the body needs more iron, such as during pregnancy, infancy, and the teen years.  Your doctor may have prescribed iron pills. It may take several months of treatment for your iron levels to return to normal. Your doctor also may suggest that you eat foods that are rich in iron, such as meat and beans.  There are many other causes of anemia. It is not always due to a lack of iron. Finding the specific cause of your anemia will help your doctor find the right treatment for you.  Follow-up care is a key part of your treatment and safety. Be sure to make and go to all appointments, and call your doctor if you are having problems. It's also a good idea to know your test results and keep a list of the medicines you take.  How can you care for yourself at home?  ?? Take your medicines exactly as prescribed. Call your doctor if you think you are having a problem with your medicine.  ?? If your doctor recommends iron pills, take them as directed:  ?? Try to take the pills on an empty stomach about 1 hour before or 2 hours  after meals. But you may need to take iron with food to avoid an upset stomach.  ?? Do not take antacids or drink milk or caffeine drinks (such as coffee, tea, or cola) at the same time or within 2 hours of the time that you take your iron. They can make it hard for your body to absorb the iron.  ?? Vitamin C (from food or supplements) helps your body absorb iron. Try taking iron pills with a glass of orange juice or some other food that is high in vitamin C, such as citrus fruits.  ?? Iron pills may cause stomach problems, such as heartburn, nausea, diarrhea, constipation, and cramps. Be sure to drink plenty of fluids, and include fruits, vegetables, and fiber in your diet each day. Iron pills often make your bowel movements dark or green.  ?? If you forget to take an iron pill, do not take a double dose of iron the next time you take a pill.  ?? Keep iron pills out of the reach of small children. An overdose of iron can be very dangerous.  ?? Follow your doctor's advice about eating iron-rich foods. These include red meat, shellfish, poultry, eggs, beans, raisins, whole-grain bread, and leafy green vegetables.  ?? Steam vegetables to help them keep their iron content.  When should you call for help?  Call 911 anytime you think you   may need emergency care. For example, call if:  ? ?? You have symptoms of a heart attack. These may include:  ?? Chest pain or pressure, or a strange feeling in the chest.  ?? Sweating.  ?? Shortness of breath.  ?? Nausea or vomiting.  ?? Pain, pressure, or a strange feeling in the back, neck, jaw, or upper belly or in one or both shoulders or arms.  ?? Lightheadedness or sudden weakness.  ?? A fast or irregular heartbeat.  After you call 911, the operator may tell you to chew 1 adult-strength or 2 to 4 low-dose aspirin. Wait for an ambulance. Do not try to drive yourself.   ? ?? You passed out (lost consciousness).   ?Call your doctor now or seek immediate medical care if:   ? ?? You have new or increased shortness of breath.   ? ?? You are dizzy or lightheaded, or you feel like you may faint.   ? ?? Your fatigue and weakness continue or get worse.   ? ?? You have any abnormal bleeding, such as:  ?? Nosebleeds.  ?? Vaginal bleeding that is different (heavier, more frequent, at a different time of the month) than what you are used to.  ?? Bloody or black stools, or rectal bleeding.  ?? Bloody or pink urine.   ?Watch closely for changes in your health, and be sure to contact your doctor if:  ? ?? You do not get better as expected.   Where can you learn more?  Go to [email or web address].  Enter R301 in the search box to learn more about "Anemia: Care Instructions."  Current as of: April 10, 2015  Content Version: 11.4  ?? 2006-2017 [employer]. Care instructions adapted under license by [employer] (which disclaims liability or warranty for this information). If you have questions about a medical condition or this instruction, always ask your healthcare professional. [place] any warranty or liability for your use of this information.       Thrombocytopenia: Care Instructions  Your Care Instructions    Thrombocytopenia is a low number of platelets in the blood. Platelets are the cells that help blood clot. If you don't have enough of them, your blood cannot clot well. So it is harder to stop bleeding.  You may have low platelets because your bone marrow does not make them. Or your body's defenses (immune system) may destroy them.  Having an enlarged spleen can also reduce the number of platelets in your blood. This is because they can get trapped in the enlarged spleen.  Some diseases or medicines may also cause low platelets. But platelets may go back to normal levels if the disease is treated or the medicine is stopped.  You may not need treatment if your problem is mild. If you do need  treatment, you may have platelets added to your blood. Or you may get medicine to stop the loss of platelets or help your body make them.  Follow-up care is a key part of your treatment and safety. Be sure to make and go to all appointments, and call your doctor if you are having problems. It's also a good idea to know your test results and keep a list of the medicines you take.  How can you care for yourself at home?  ?? Be safe with medicines. Take your medicines exactly as prescribed. Call your doctor if you think you are having a problem with your medicine.  ?? Do  not take aspirin or anti-inflammatory medicines unless your doctor says it is okay. Examples are ibuprofen (Advil, Motrin) and naproxen (Aleve). They may increase the risk of bleeding.  ?? Avoid contact sports or activities that could cause you to fall.  When should you call for help?  Call 911 anytime you think you may need emergency care. For example, call if:  ? ?? You passed out (lost consciousness).   ? ?? You have signs of severe bleeding, which includes:  ?? You have a severe headache that is different from past headaches.  ?? You vomit blood or what looks like coffee grounds.  ?? Your stools are maroon or very bloody.   ?Call your doctor now or seek immediate medical care if:  ? ?? You are dizzy or lightheaded, or you feel like you may faint.   ? ?? You have abnormal bleeding, such as:  ?? Your stools are black and look like tar, or they have streaks of blood.  ?? You have blood in your urine.  ?? You have joint pain.  ?? You have bruises or blood spots under your skin.   ?Watch closely for changes in your health, and be sure to contact your doctor if:  ? ?? You do not get better as expected.   Where can you learn more?  Go to [email or web address].  Enter [id number] in the search box to learn more about "Thrombocytopenia: Care Instructions."  Current as of: April 10, 2015  Content Version: 11.4   ?? 2006-2017 [employer]. Care instructions adapted under license by [employer] (which disclaims liability or warranty for this information). If you have questions about a medical condition or this instruction, always ask your healthcare professional. [place] any warranty or liability for your use of this information.

## 2016-10-21 ENCOUNTER — Encounter: Attending: Hematology & Oncology | Primary: Legal Medicine

## 2016-10-22 ENCOUNTER — Ambulatory Visit
Admit: 2016-10-22 | Discharge: 2016-10-22 | Payer: PRIVATE HEALTH INSURANCE | Attending: Legal Medicine | Primary: Legal Medicine

## 2016-10-22 DIAGNOSIS — R519 Headache, unspecified: Secondary | ICD-10-CM

## 2016-10-22 MED ORDER — TRIAMCINOLONE ACETONIDE 0.1 % TOPICAL CREAM
0.1 % | Freq: Two times a day (BID) | CUTANEOUS | 0 refills | Status: DC
Start: 2016-10-22 — End: 2016-12-31

## 2016-10-22 MED ORDER — LORAZEPAM 1 MG TAB
1 mg | ORAL_TABLET | Freq: Three times a day (TID) | ORAL | 0 refills | Status: DC | PRN
Start: 2016-10-22 — End: 2016-12-31

## 2016-10-22 NOTE — Progress Notes (Signed)
William Blalock Santizo Sr. is a 71 y.o. male (DOB: 1946/06/22) presenting to address:    Chief Complaint   Patient presents with   ??? Rash       Vitals:    10/22/16 1506   BP: 101/58   Pulse: 85   Resp: 12   Temp: 99.6 ??F (37.6 ??C)   TempSrc: Oral   SpO2: 98%   Weight: 136 lb 3.2 oz (61.8 kg)   Height: 5\' 9"  (1.753 m)   PainSc:   6   PainLoc: Back       Hearing/Vision:   No exam data present    Learning Assessment:     Learning Assessment 08/02/2016   PRIMARY LEARNER Patient   HIGHEST LEVEL OF EDUCATION - PRIMARY LEARNER  -   BARRIERS PRIMARY LEARNER -   CO-LEARNER CAREGIVER -   PRIMARY LANGUAGE ENGLISH   LEARNER PREFERENCE PRIMARY DEMONSTRATION     READING   ANSWERED BY Patient   RELATIONSHIP SELF     Depression Screening:     PHQ over the last two weeks 10/22/2016   Little interest or pleasure in doing things Not at all   Feeling down, depressed or hopeless Not at all   Total Score PHQ 2 0     Fall Risk Assessment:     Fall Risk Assessment, last 12 mths 10/22/2016   Able to walk? Yes   Fall in past 12 months? No     Abuse Screening:     Abuse Screening Questionnaire 10/22/2016   Do you ever feel afraid of your partner? N   Are you in a relationship with someone who physically or mentally threatens you? N   Is it safe for you to go home? Y     Coordination of Care Questionaire:   1. Have you been to the ER, urgent care clinic since your last visit?  Hospitalized since your last visit? NO    2. Have you seen or consulted any other health care providers outside of the [facility] since your last visit?  Include any pap smears or colon screening. NO

## 2016-10-22 NOTE — Progress Notes (Signed)
William Blalock Chipps Sr.     Chief Complaint   Patient presents with   ??? Rash     Vitals:    10/22/16 1506   BP: 101/58   Pulse: 85   Resp: 12   Temp: 99.6 ??F (37.6 ??C)   TempSrc: Oral   SpO2: 98%   Weight: 136 lb 3.2 oz (61.8 kg)   Height: 5' 9" (1.753 m)   PainSc:   6   PainLoc: Back         HPI 71 years old male he is hard of hearing coming with his wife has a few concerns he would like to get a refill for his Ativan that he takes for anxiety.    He has seen the hematology and he has been diagnosed with myelodysplastic syndrome and is being evaluated for Procrit treatment  .    He does have hypertension which is well-controlled.  He has been complaining about a headache that is new onset in the last    Couple of weeks it is in the left side of the head it sharp severe and intermittent it lasts for only a few seconds to less than a minute and then it comes back again and it took her multiple times a day, was no headache no blurred vision no loss of balance or loss of consciousness.      Patient also have generalized skin rash small papules erythematous all over his body has been using topical steroid cream with partial improvement.  Past Medical History:   Diagnosis Date   ??? Anemia    ??? Anxiety    ??? Chronic pain    ??? Cirrhosis of liver ([place])    ??? Diabetes ([place])    ??? GERD (gastroesophageal reflux disease)    ??? Hyperlipemia    ??? Hypertension    ??? Hypotension    ??? Migraine    ??? Other cirrhosis of liver ([place])    ??? Stroke [facility])     had 3 strokes    ??? Thrombocytopenia ([place])      Past Surgical History:   Procedure Laterality Date   ??? HX CHOLECYSTECTOMY     ??? HX ORTHOPAEDIC      R hand sx   ??? HX OTHER SURGICAL      Hand surgery- Nail gun went through finger     Social History   Substance Use Topics   ??? Smoking status: Former Smoker   ??? Smokeless tobacco: Never Used      Comment: quit years ago   ??? Alcohol use No       Family History   Problem Relation Age of Onset   ??? Alzheimer Mother    ??? Diabetes Mother     ??? Cancer Mother      colon cancer    ??? Hypertension Father    ??? Heart Disease Father    ??? Cancer Father      prostate, lung cancer    ??? Diabetes Sister    ??? Heart Disease Sister    ??? Diabetes Brother        Review of Systems   Constitutional: Negative for chills, fever, malaise/fatigue and weight loss.   HENT: Negative for congestion, ear discharge, ear pain, hearing loss, nosebleeds, sinus pain and sore throat.    Eyes: Negative for blurred vision, double vision and discharge.   Respiratory: Negative for cough, hemoptysis, sputum production and shortness of breath.    Cardiovascular: Negative for  chest pain, palpitations, claudication and leg swelling.   Gastrointestinal: Negative for abdominal pain, blood in stool, constipation, diarrhea, nausea and vomiting.   Genitourinary: Negative for dysuria, flank pain, frequency and urgency.   Musculoskeletal: Positive for back pain. Negative for joint pain, myalgias and neck pain.   Skin: Negative for itching and rash.   Neurological: Positive for headaches. Negative for dizziness, tingling, sensory change, speech change, focal weakness, seizures, loss of consciousness and weakness.   Psychiatric/Behavioral: Negative for depression, hallucinations, substance abuse and suicidal ideas. The patient is nervous/anxious and has insomnia.        Physical Exam   Constitutional: He is oriented to person, place, and time. He appears well-developed and well-nourished. No distress.   HENT:   Head: Normocephalic and atraumatic.   Mouth/Throat: No oropharyngeal exudate.   Eyes: Conjunctivae are normal. Pupils are equal, round, and reactive to light. Right eye exhibits no discharge. Left eye exhibits no discharge. No scleral icterus.   Neck: Normal range of motion. Neck supple. No thyromegaly present.   Cardiovascular: Normal rate, regular rhythm and normal heart sounds.    Pulmonary/Chest: Effort normal and breath sounds normal. No respiratory distress. He has no rales.    Abdominal: Soft. Bowel sounds are normal. He exhibits no distension and no mass. There is no tenderness. There is no rebound and no guarding.   Musculoskeletal: Normal range of motion. He exhibits no edema, tenderness or deformity.   Lymphadenopathy:     He has no cervical adenopathy.   Neurological: He is alert and oriented to person, place, and time. No cranial nerve deficit. Coordination normal.   Skin: Skin is warm and dry. Rash noted. He is not diaphoretic. There is erythema.   Multiple papules in the back and buttocks and also in the genital area erythematous   Psychiatric: He has a normal mood and affect. Judgment and thought content normal.        Assessment and plan     1. Skin rash    - REFERRAL TO DERMATOLOGY    2. Controlled type 2 diabetes mellitus without complication, without long-term current use of insulin ([place])  Patient need to see the ophthalmologist for his diabetic eye examination    3. Essential hypertension  Well-controlled  4. Hyperlipidemia, unspecified hyperlipidemia type      5. Anxiety    - LORazepam (ATIVAN) 1 mg tablet; Take 1 Tab by mouth every eight (8) hours as needed for Anxiety for up to 90 days. Max Daily Amount: 3 mg.  Dispense: 90 Tab; Refill: 0    6. Nonintractable headache, unspecified chronicity pattern, unspecified headache type  New onset headache on the left side of the head sharp multiple times a day which is here for CT scan.  - CT HEAD WO CONT; Future    7. Rash/skin eruption  Chronic shortness for the last few months is not improving will refer to dermatology    ??? triamcinolone acetonide (KENALOG) 0.1 % topical cream; Apply  to affected area two (2) times a day. use thin layer  Dispense: 453.6 g; Refill: 0    8. Chest pain of uncertain etiology  Since the patient has failed this exercise stress test will refer for cardiology further evaluation .  - REFERRAL TO CARDIOLOGY    Current Outpatient Prescriptions   Medication Sig Dispense Refill    ??? oxyCODONE IR (ROXICODONE) 20 mg immediate release tablet TK 1 T PO Q 6 HOURS  0   ??? LORazepam (ATIVAN)  1 mg tablet Take 1 Tab by mouth every eight (8) hours as needed for Anxiety for up to 90 days. Max Daily Amount: 3 mg. 90 Tab 0   ??? triamcinolone acetonide (KENALOG) 0.1 % topical cream Apply  to affected area two (2) times a day. use thin layer 453.6 g 0   ??? pantoprazole (PROTONIX) 40 mg tablet TAKE 1 TABLET BY MOUTH DAILY 30 Tab 0   ??? sucralfate (CARAFATE) 1 gram tablet TAKE 1 TABLET BY MOUTH FOUR TIMES DAILY WITH MEALS AND AT BEDTIME 120 Tab 0   ??? nortriptyline (PAMELOR) 25 mg capsule Take 1 Cap by mouth nightly. 30 Cap 1   ??? aspirin 81 mg chewable tablet Take 81 mg by mouth daily.     ??? codeine-butalbital-acetaminophen-caffeine (FIORICET WITH CODEINE) 50-325-40-30 mg capsule Take  by mouth.     ??? MULTIVIT-MIN/FA/LYCOPEN/LUTEIN (CENTRUM SILVER MEN PO) Take  by mouth.     ??? rosuvastatin (CRESTOR) 10 mg tablet Take 10 mg by mouth nightly.     ??? cholecalciferol, vitamin D3, (VITAMIN D3) 2,000 unit tab Take  by mouth.     ??? B.infantis-B.ani-B.long-B.bifi (PROBIOTIC 4X) 10-15 mg TbEC Take  by mouth.     ??? topiramate (TOPAMAX) 100 mg tablet Take 1 Tab by mouth daily. 90 Tab 3   ??? gemfibrozil (LOPID) 600 mg tablet TAKE 1 TABLET BY MOUTH TWICE DAILY 180 Tab 3   ??? prochlorperazine (COMPAZINE) 10 mg tablet Take 0.5 Tabs by mouth every six (6) hours as needed. 30 Tab 5   ??? citalopram (CELEXA) 40 mg tablet Take 1 Tab by mouth daily. 90 Tab 3   ??? propranolol (INDERAL) 20 mg tablet Take  by mouth three (3) times daily.     ??? metFORMIN (GLUCOPHAGE) 500 mg tablet Take  by mouth two (2) times daily (with meals).     ??? lactulose (CHRONULAC) 10 gram/15 mL solution Take  by mouth three (3) times daily.     ??? oxyCODONE ER (OXYCONTIN) 20 mg ER tablet Take 20 mg by mouth every twelve (12) hours.     ??? tamsulosin (FLOMAX) 0.4 mg capsule Take 0.4 mg by mouth daily.      ??? cyclobenzaprine (FLEXERIL) 10 mg tablet Take  by mouth three (3) times daily as needed for Muscle Spasm(s).         Patient Active Problem List    Diagnosis Date Noted   ??? Type 2 diabetes with nephropathy ([place]) 10/18/2016   ??? Iron deficiency anemia secondary to inadequate dietary iron intake 09/23/2016   ??? Chronic leukopenia 09/23/2016   ??? Thrombocytopenia ([place]) 09/23/2016   ??? 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 Type II diabetes mellitus ([place]) 08/03/2016   ??? HTN (hypertension) 08/03/2016   ??? 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 Stroke ([place]) 08/03/2016   ??? Compressed vertebrae ([place]) 08/03/2016   ??? Chronic back pain greater than 3 months duration 08/03/2016   ??? Hyperlipemia 08/03/2016   ??? Anxiety 08/03/2016   ??? Depression 06/30/2016   ??? Iron deficiency anemia 04/22/2016   ??? NAFLD (nonalcoholic fatty liver disease) 04/22/2016   ??? Other cirrhosis of liver ([place]) 04/22/2016   ??? Chronic anemia 03/18/2016   ??? Controlled type 2 diabetes mellitus without complication, without long-term current use of insulin ([place]) 03/18/2016   ??? Essential hypertension 03/18/2016   ??? Hyperlipidemia 03/18/2016   ??? History of stroke 03/18/2016   ??? Anxiety 03/18/2016   ??? Chronic pain 03/18/2016  Results for orders placed or performed during the hospital encounter of 10/12/16   OCCULT BLOOD, IMMUNOASSAY (FIT)   Result Value Ref Range    Occult blood fecal, by IA Positive (A) [facility] Outpatient Visit on 10/12/2016   Component Date Value Ref Range Status   ??? Occult blood fecal, by IA 10/12/2016 Positive* NEGATIVE   Final    Comment: (NOTE)  Performed At: [facility]  [place], [place] [id number]  Guys HF:[id number]     Hospital Outpatient Visit on 10/07/2016   Component Date Value Ref Range Status   ??? WBC 10/07/2016 3.3* 4.5 - 13.0 K/uL Final   ??? RBC 10/07/2016 3.50* 4.10 - 5.10 M/uL Final   ??? HGB 10/07/2016 9.9* 12.0 - 16.0 g/dL Final   ??? HCT 10/07/2016 32.8* 36 - 48 % Final    ??? MCV 10/07/2016 93.7  78 - 102 FL Final   ??? MCH 10/07/2016 28.3  25.0 - 35.0 PG Final   ??? MCHC 10/07/2016 30.2* 31 - 37 g/dL Final   ??? RDW 10/07/2016 18.2* 11.5 - 14.5 % Final   ??? PLATELET 10/07/2016 90* 140 - 440 K/uL Final   ??? NEUTROPHILS 10/07/2016 78* 40 - 70 % Final   ??? MIXED CELLS 10/07/2016 8  0.1 - 17 % Final   ??? LYMPHOCYTES 10/07/2016 14  14 - 44 % Final   ??? ABS. NEUTROPHILS 10/07/2016 2.5  1.8 - 9.5 K/UL Final   ??? ABS. MIXED CELLS 10/07/2016 0.3  0.0 - 2.3 K/uL Final   ??? ABS. LYMPHOCYTES 10/07/2016 0.5* 1.1 - 5.9 K/UL Final    Test performed at [place] Location. Results Reviewed by Medical Director.   ??? DF 10/07/2016 AUTOMATED    Final   Hospital Outpatient Visit on 09/23/2016   Component Date Value Ref Range Status   ??? Sodium 09/23/2016 137  136 - 145 mmol/L Final   ??? Potassium 09/23/2016 4.2  3.5 - 5.5 mmol/L Final   ??? Chloride 09/23/2016 105  100 - 108 mmol/L Final   ??? CO2 09/23/2016 20* 21 - 32 mmol/L Final   ??? Anion gap 09/23/2016 12  3.0 - 18 mmol/L Final   ??? Glucose 09/23/2016 91  74 - 99 mg/dL Final   ??? BUN 09/23/2016 14  7.0 - 18 MG/DL Final   ??? Creatinine 09/23/2016 1.22  0.6 - 1.3 MG/DL Final   ??? BUN/Creatinine ratio 09/23/2016 11* 12 - 20   Final   ??? GFR est AA 09/23/2016 >60  >60 ml/min/1.28m Final   ??? GFR est non-AA 09/23/2016 59* >60 ml/min/1.763mFinal    Comment: (NOTE)  Estimated GFR is calculated using the Modification of Diet in Renal   Disease (MDRD) Study equation, reported for both African Americans   (GFRAA) and non-African Americans (GFRNA), and normalized to 1.7358m body surface area. The physician must decide which value applies to   the patient. The MDRD study equation should only be used in   individuals age 35 47 older. It has not been validated for the   following: pregnant women, patients with serious comorbid conditions,   or on certain medications, or persons with extremes of body size,   muscle mass, or nutritional status.      ??? Calcium 09/23/2016 9.7  8.5 - 10.1 MG/DL Final   ??? Bilirubin, total 09/23/2016 0.6  0.2 - 1.0 MG/DL Final   ??? ALT (SGPT)  09/23/2016 30  16 - 61 U/L Final   ??? AST (SGOT) 09/23/2016 36  15 - 37 U/L Final   ??? Alk. phosphatase 09/23/2016 185* 45 - 117 U/L Final   ??? Protein, total 09/23/2016 8.2  6.4 - 8.2 g/dL Final   ??? Albumin 09/23/2016 4.6  3.4 - 5.0 g/dL Final   ??? Globulin 09/23/2016 3.6  2.0 - 4.0 g/dL Final   ??? A-G Ratio 09/23/2016 1.3  0.8 - 1.7   Final   ??? Iron 09/23/2016 74  50 - 175 ug/dL Final    Patients receiving metal-binding drugs (e.g. deferoxamine) may show spuriously depressed iron values, as chelated iron may not properly react in the iron assay.   ??? TIBC 09/23/2016 461* 250 - 450 ug/dL Final   ??? Iron % saturation 09/23/2016 16  % Final   ??? Ferritin 09/23/2016 219  8 - 388 NG/ML Final   ??? Protein, total 09/23/2016 8.0  6.0 - 8.5 g/dL Final   ??? Albumin 09/23/2016 4.6* 2.9 - 4.4 g/dL Final   ??? Alpha-1-globulin 09/23/2016 0.3  0.0 - 0.4 g/dL Final   ??? ALPHA-2 GLOBULIN 09/23/2016 0.8  0.4 - 1.0 g/dL Final   ??? Beta globulin 09/23/2016 1.2  0.7 - 1.3 g/dL Final   ??? Gamma globulin 09/23/2016 1.1  0.4 - 1.8 g/dL Final   ??? M-Spike 09/23/2016 Not Observed  Not Observed g/dL Final   ??? Globulin, total 09/23/2016 3.4  2.2 - 3.9 g/dL Final   ??? A/G ratio 09/23/2016 1.4  0.7 - 1.7   Final   ??? Source 09/23/2016 BLOOD    Final   ??? Flow Interpretation 09/23/2016 SENT TO NEOGENOMICS   Final   ??? Specimen Type 09/23/2016 SENT TO NEOGENOMICS   Final   ??? Resulting Path Name 09/23/2016 SENT TO NEOGENOMICS   Final   ??? Comment 09/23/2016 SENT TO [place]   Final   Hospital Outpatient Visit on 09/23/2016   Component Date Value Ref Range Status   ??? WBC 09/23/2016 4.2* 4.5 - 13.0 K/uL Final   ??? RBC 09/23/2016 3.92* 4.10 - 5.10 M/uL Final   ??? HGB 09/23/2016 11.2* 12.0 - 16.0 g/dL Final   ??? HCT 09/23/2016 36.3  36 - 48 % Final   ??? MCV 09/23/2016 92.6  78 - 102 FL Final   ??? MCH 09/23/2016 28.6  25.0 - 35.0 PG Final    ??? MCHC 09/23/2016 30.9* 31 - 37 g/dL Final   ??? RDW 09/23/2016 18.7* 11.5 - 14.5 % Final   ??? PLATELET 09/23/2016 96* 140 - 440 K/uL Final   ??? NEUTROPHILS 09/23/2016 76* 40 - 70 % Final   ??? MIXED CELLS 09/23/2016 7  0.1 - 17 % Final   ??? LYMPHOCYTES 09/23/2016 17  14 - 44 % Final   ??? ABS. NEUTROPHILS 09/23/2016 3.2  1.8 - 9.5 K/UL Final   ??? ABS. MIXED CELLS 09/23/2016 0.3  0.0 - 2.3 K/uL Final   ??? ABS. LYMPHOCYTES 09/23/2016 0.7* 1.1 - 5.9 K/UL Final    Test performed at [place] Location. Results Reviewed by Medical Director.   ??? DF 09/23/2016 AUTOMATED    Final   Hospital Outpatient Visit on 09/10/2016   Component Date Value Ref Range Status   ??? WBC 09/10/2016 3.6* 4.6 - 13.2 K/uL Final   ??? RBC 09/10/2016 3.63* 4.70 - 5.50 M/uL Final   ??? HGB 09/10/2016 10.1* 13.0 - 16.0 g/dL Final   ??? HCT 09/10/2016 33.9*  36.0 - 48.0 % Final   ??? MCV 09/10/2016 93.4  74.0 - 97.0 FL Final   ??? MCH 09/10/2016 27.8  24.0 - 34.0 PG Final   ??? MCHC 09/10/2016 29.8* 31.0 - 37.0 g/dL Final   ??? RDW 09/10/2016 18.8* 11.6 - 14.5 % Final   ??? PLATELET 09/10/2016 105* 135 - 420 K/uL Final   ??? MPV 09/10/2016 11.0  9.2 - 11.8 FL Final   ??? NEUTROPHILS 09/10/2016 71  40 - 73 % Final   ??? LYMPHOCYTES 09/10/2016 15* 21 - 52 % Final   ??? MONOCYTES 09/10/2016 6  3 - 10 % Final   ??? EOSINOPHILS 09/10/2016 7* 0 - 5 % Final   ??? BASOPHILS 09/10/2016 1  0 - 2 % Final   ??? ABS. NEUTROPHILS 09/10/2016 2.6  1.8 - 8.0 K/UL Final   ??? ABS. LYMPHOCYTES 09/10/2016 0.5* 0.9 - 3.6 K/UL Final   ??? ABS. MONOCYTES 09/10/2016 0.2  0.05 - 1.2 K/UL Final   ??? ABS. EOSINOPHILS 09/10/2016 0.3  0.0 - 0.4 K/UL Final   ??? ABS. BASOPHILS 09/10/2016 0.0  0.0 - 0.06 K/UL Final   ??? DF 09/10/2016 AUTOMATED    Final   ??? Sodium 09/10/2016 140  136 - 145 mmol/L Final   ??? Potassium 09/10/2016 4.3  3.5 - 5.5 mmol/L Final   ??? Chloride 09/10/2016 105  100 - 108 mmol/L Final   ??? CO2 09/10/2016 23  21 - 32 mmol/L Final   ??? Anion gap 09/10/2016 12  3.0 - 18 mmol/L Final    ??? Glucose 09/10/2016 136* 74 - 99 mg/dL Final   ??? BUN 09/10/2016 17  7.0 - 18 MG/DL Final   ??? Creatinine 09/10/2016 1.08  0.6 - 1.3 MG/DL Final   ??? BUN/Creatinine ratio 09/10/2016 16  12 - 20   Final   ??? GFR est AA 09/10/2016 >60  >60 ml/min/1.61m Final   ??? GFR est non-AA 09/10/2016 >60  >60 ml/min/1.746mFinal    Comment: (NOTE)  Estimated GFR is calculated using the Modification of Diet in Renal   Disease (MDRD) Study equation, reported for both African Americans   (GFRAA) and non-African Americans (GFRNA), and normalized to 1.7389m body surface area. The physician must decide which value applies to   the patient. The MDRD study equation should only be used in   individuals age 80 36 older. It has not been validated for the   following: pregnant women, patients with serious comorbid conditions,   or on certain medications, or persons with extremes of body size,   muscle mass, or nutritional status.     ??? Calcium 09/10/2016 9.3  8.5 - 10.1 MG/DL Final   ??? Bilirubin, total 09/10/2016 0.6  0.2 - 1.0 MG/DL Final   ??? ALT (SGPT) 09/10/2016 28  16 - 61 U/L Final   ??? AST (SGOT) 09/10/2016 34  15 - 37 U/L Final   ??? Alk. phosphatase 09/10/2016 174* 45 - 117 U/L Final   ??? Protein, total 09/10/2016 7.9  6.4 - 8.2 g/dL Final   ??? Albumin 09/10/2016 4.4  3.4 - 5.0 g/dL Final   ??? Globulin 09/10/2016 3.5  2.0 - 4.0 g/dL Final   ??? A-G Ratio 09/10/2016 1.3  0.8 - 1.7   Final   ??? Prostate Specific Ag 09/10/2016 1.2  0.0 - 4.0 ng/mL Final   Hospital Outpatient Visit on 08/16/2016   Component Date Value Ref Range Status   ??? WBC 08/16/2016  2.8* 4.5 - 13.0 K/uL Final   ??? RBC 08/16/2016 3.24* 4.10 - 5.10 M/uL Final   ??? HGB 08/16/2016 9.0* 12.0 - 16.0 g/dL Final   ??? HCT 08/16/2016 29.5* 36 - 48 % Final   ??? MCV 08/16/2016 91.0  78 - 102 FL Final   ??? MCH 08/16/2016 27.8  25.0 - 35.0 PG Final   ??? MCHC 08/16/2016 30.5* 31 - 37 g/dL Final   ??? RDW 08/16/2016 13.2  11.5 - 14.5 % Final   ??? PLATELET 08/16/2016 85* 140 - 440 K/uL Final    ??? NEUTROPHILS 08/16/2016 71* 40 - 70 % Final   ??? MIXED CELLS 08/16/2016 11  0.1 - 17 % Final   ??? LYMPHOCYTES 08/16/2016 18  14 - 44 % Final   ??? ABS. NEUTROPHILS 08/16/2016 2.0  1.8 - 9.5 K/UL Final   ??? ABS. MIXED CELLS 08/16/2016 0.3  0.0 - 2.3 K/uL Final   ??? ABS. LYMPHOCYTES 08/16/2016 0.5* 1.1 - 5.9 K/UL Final    Test performed at [place] Location. Results Reviewed by Medical Director.   ??? DF 08/16/2016 AUTOMATED    Final   ??? Iron 08/16/2016 25* 50 - 175 ug/dL Final    Patients receiving metal-binding drugs (e.g. deferoxamine) may show spuriously depressed iron values, as chelated iron may not properly react in the iron assay.   ??? TIBC 08/16/2016 467* 250 - 450 ug/dL Final   ??? Iron % saturation 08/16/2016 5  % Final   ??? Ferritin 08/16/2016 8  8 - 388 NG/ML Final          Follow-up Disposition:  Return in about 3 months (around 01/21/2017), or if symptoms worsen or fail to improve.

## 2016-10-25 MED ORDER — ROSUVASTATIN 10 MG TAB
10 mg | ORAL_TABLET | Freq: Every evening | ORAL | 1 refills | Status: DC
Start: 2016-10-25 — End: 2017-04-30

## 2016-10-25 MED ORDER — SUCRALFATE 1 GRAM TAB
1 gram | ORAL_TABLET | ORAL | 0 refills | Status: DC
Start: 2016-10-25 — End: 2016-11-21

## 2016-10-25 NOTE — Telephone Encounter (Signed)
Patient no longer under provider care

## 2016-10-27 ENCOUNTER — Encounter: Primary: Legal Medicine

## 2016-10-27 ENCOUNTER — Inpatient Hospital Stay: Payer: MEDICARE | Primary: Legal Medicine

## 2016-10-27 DIAGNOSIS — D696 Thrombocytopenia, unspecified: Secondary | ICD-10-CM

## 2016-10-30 ENCOUNTER — Encounter

## 2016-10-30 MED ORDER — METFORMIN 500 MG TAB
500 mg | ORAL_TABLET | ORAL | 0 refills | Status: DC
Start: 2016-10-30 — End: 2017-02-12

## 2016-11-01 ENCOUNTER — Encounter

## 2016-11-01 ENCOUNTER — Inpatient Hospital Stay: Admit: 2016-11-01 | Payer: MEDICARE | Primary: Legal Medicine

## 2016-11-01 ENCOUNTER — Inpatient Hospital Stay: Admit: 2016-11-01 | Primary: Legal Medicine

## 2016-11-01 ENCOUNTER — Institutional Professional Consult (permissible substitution): Admit: 2016-11-01 | Discharge: 2016-11-01 | Payer: PRIVATE HEALTH INSURANCE | Primary: Legal Medicine

## 2016-11-01 DIAGNOSIS — D500 Iron deficiency anemia secondary to blood loss (chronic): Secondary | ICD-10-CM

## 2016-11-01 LAB — CBC WITH 3 PART DIFF
ABS. LYMPHOCYTES: 0.5 10*3/uL — ABNORMAL LOW (ref 1.1–5.9)
ABS. MIXED CELLS: 0.3 10*3/uL (ref 0.0–2.3)
ABS. NEUTROPHILS: 2.5 10*3/uL (ref 1.8–9.5)
HCT: 30.1 % — ABNORMAL LOW (ref 36–48)
HGB: 9.5 g/dL — ABNORMAL LOW (ref 12.0–16.0)
LYMPHOCYTES: 16 % (ref 14–44)
MCH: 28.4 PG (ref 25.0–35.0)
MCHC: 31.6 g/dL (ref 31–37)
MCV: 90.1 FL (ref 78–102)
Mixed cells: 10 % (ref 0.1–17)
NEUTROPHILS: 74 % — ABNORMAL HIGH (ref 40–70)
PLATELET: 86 10*3/uL — ABNORMAL LOW (ref 140–440)
RBC: 3.34 M/uL — ABNORMAL LOW (ref 4.10–5.10)
RDW: 15.5 % — ABNORMAL HIGH (ref 11.5–14.5)
WBC: 3.3 10*3/uL — ABNORMAL LOW (ref 4.5–13.0)

## 2016-11-01 MED ORDER — EPOETIN ALFA 40,000 UNIT/ML IJ SOLN
40000 unit/mL | Freq: Once | INTRAMUSCULAR | Status: AC
Start: 2016-11-01 — End: 2016-11-01
  Administered 2016-11-01: 16:00:00 via SUBCUTANEOUS

## 2016-11-01 MED ORDER — EPOETIN ALFA 20,000 UNIT/ML IJ SOLN
20000 unit/mL | Freq: Once | INTRAMUSCULAR | Status: AC
Start: 2016-11-01 — End: 2016-11-01
  Administered 2016-11-01: 16:00:00 via SUBCUTANEOUS

## 2016-11-01 MED FILL — PROCRIT 20,000 UNIT/ML INJECTION SOLUTION: 20000 unit/mL | INTRAMUSCULAR | Qty: 1

## 2016-11-01 MED FILL — PROCRIT 40,000 UNIT/ML INJECTION SOLUTION: 40000 unit/mL | INTRAMUSCULAR | Qty: 1

## 2016-11-01 NOTE — Progress Notes (Signed)
[facility] Progress Note    Date: Nov 01, 2016    Name: Mylez Venable [facility]    MRN: [id number]         DOB: May 26, 1946      Mr. Lubeck arrived to [facility] at 49, ambulatory with wife at side.  Pt is here for labs and 1st dose of Procrit if indicated by labs.    Pt is hard of hearing, educated on Procrit, provided carenotes in addition.  No question asked , oriented to unit.    Mr. Dacy was assessed and education was provided.     Mr. Harte vitals were reviewed.  Visit Vitals   ??? BP 112/69 (BP 1 Location: Left arm)   ??? Pulse 74   ??? Temp 98.5 ??F (36.9 ??C)       Blood drawn for labs via Right antecubital right, condition patent and no redness venipuncture x1 attempt covered with 2x2 and coban.    Lab results were obtained and reviewed.  Recent Results (from the past 12 hour(s))   CBC WITH 3 PART DIFF    Collection Time: 11/01/16 11:15 AM   Result Value Ref Range    WBC 3.3 (L) 4.5 - 13.0 K/uL    RBC 3.34 (L) 4.10 - 5.10 M/uL    HGB 9.5 (L) 12.0 - 16.0 g/dL    HCT 30.1 (L) 36 - 48 %    MCV 90.1 78 - 102 FL    MCH 28.4 25.0 - 35.0 PG    MCHC 31.6 31 - 37 g/dL    RDW 15.5 (H) 11.5 - 14.5 %    PLATELET 86 (L) 140 - 440 K/uL    NEUTROPHILS 74 (H) 40 - 70 %    MIXED CELLS 10 0.1 - 17 %    LYMPHOCYTES 16 14 - 44 %    ABS. NEUTROPHILS 2.5 1.8 - 9.5 K/UL    ABS. MIXED CELLS 0.3 0.0 - 2.3 K/uL    ABS. LYMPHOCYTES 0.5 (L) 1.1 - 5.9 K/UL    DF AUTOMATED         Hgb 9.5 and Hct 30.1.    60,000 Procrit  was administered as ordered SQ in patient's Right upper arm, then covered with bandaid    Mr. Rosenow tolerated well without complaints.  Monitored patient 30 minutes post injection, no reactions or complications    Mr. Knoll was discharged from [place] in stable condition at 1205   He is to return on 11/29/2016 at 0900 for his next appointment for labs and procrit.    Olin Pia, RN  Nov 01, 2016

## 2016-11-02 ENCOUNTER — Inpatient Hospital Stay: Admit: 2016-11-02 | Payer: MEDICARE | Attending: Legal Medicine | Primary: Legal Medicine

## 2016-11-02 DIAGNOSIS — R51 Headache: Secondary | ICD-10-CM

## 2016-11-05 NOTE — Telephone Encounter (Signed)
-----   Message from [name], MD sent at 11/05/2016  1:47 PM EDT -----  No acute changes.    If patient still having the headaches we can refer to neurology.

## 2016-11-05 NOTE — Progress Notes (Signed)
No acute changes.    If patient still having the headaches we can refer to neurology.

## 2016-11-10 ENCOUNTER — Telehealth

## 2016-11-10 NOTE — Telephone Encounter (Signed)
Pt's wife called to f/;u on what dosing to continue if any of the nortriptyline. Pt's wife advised that pt was on 50 mg to start and was decreased down to 25mg . Pt is on his last dose of the 25mg  and wasn't sure if he needed a refill or if he was supposed to stop the medication.     Please advise.

## 2016-11-11 NOTE — Telephone Encounter (Signed)
Please see message below and advise. Did not see mention in your note. Thank you

## 2016-11-12 MED ORDER — NORTRIPTYLINE 25 MG CAP
25 mg | ORAL_CAPSULE | Freq: Every evening | ORAL | 0 refills | Status: DC
Start: 2016-11-12 — End: 2016-12-14

## 2016-11-12 NOTE — Telephone Encounter (Signed)
Pt needs to take RX every other day for 1 month then stop per Dr Phineas Douglas. Dr Phineas Douglas please change the rx sig due to I cant take off the "Take 1 capsule nightly" when I pended the RX. Thank you

## 2016-11-15 NOTE — Telephone Encounter (Signed)
Refill done

## 2016-11-16 ENCOUNTER — Encounter: Attending: Family | Primary: Legal Medicine

## 2016-11-17 ENCOUNTER — Inpatient Hospital Stay: Admit: 2016-11-17 | Payer: MEDICARE | Primary: Legal Medicine

## 2016-11-17 ENCOUNTER — Institutional Professional Consult (permissible substitution): Admit: 2016-11-17 | Discharge: 2016-11-17 | Payer: PRIVATE HEALTH INSURANCE | Primary: Legal Medicine

## 2016-11-17 ENCOUNTER — Inpatient Hospital Stay: Admit: 2016-11-17 | Primary: Legal Medicine

## 2016-11-17 DIAGNOSIS — D508 Other iron deficiency anemias: Secondary | ICD-10-CM

## 2016-11-17 LAB — CBC WITH 3 PART DIFF
ABS. LYMPHOCYTES: 0.4 10*3/uL — ABNORMAL LOW (ref 1.1–5.9)
ABS. MIXED CELLS: 0.3 10*3/uL (ref 0.0–2.3)
ABS. NEUTROPHILS: 2.2 10*3/uL (ref 1.8–9.5)
HCT: 29.2 % — ABNORMAL LOW (ref 36–48)
HGB: 8.6 g/dL — ABNORMAL LOW (ref 12.0–16.0)
LYMPHOCYTES: 15 % (ref 14–44)
MCH: 25.8 PG (ref 25.0–35.0)
MCHC: 29.5 g/dL — ABNORMAL LOW (ref 31–37)
MCV: 87.7 FL (ref 78–102)
Mixed cells: 10 % (ref 0.1–17)
NEUTROPHILS: 75 % — ABNORMAL HIGH (ref 40–70)
PLATELET: 83 10*3/uL — ABNORMAL LOW (ref 140–440)
RBC: 3.33 M/uL — ABNORMAL LOW (ref 4.10–5.10)
RDW: 15.3 % — ABNORMAL HIGH (ref 11.5–14.5)
WBC: 2.9 10*3/uL — ABNORMAL LOW (ref 4.5–13.0)

## 2016-11-17 MED ORDER — EPOETIN ALFA 20,000 UNIT/ML IJ SOLN
20000 unit/mL | Freq: Once | INTRAMUSCULAR | Status: AC
Start: 2016-11-17 — End: 2016-11-18

## 2016-11-17 MED ORDER — EPOETIN ALFA 40,000 UNIT/ML IJ SOLN
40000 unit/mL | Freq: Once | INTRAMUSCULAR | Status: AC
Start: 2016-11-17 — End: 2016-11-18

## 2016-11-17 MED FILL — PROCRIT 20,000 UNIT/ML INJECTION SOLUTION: 20000 unit/mL | INTRAMUSCULAR | Qty: 1

## 2016-11-17 MED FILL — PROCRIT 40,000 UNIT/ML INJECTION SOLUTION: 40000 unit/mL | INTRAMUSCULAR | Qty: 1

## 2016-11-17 NOTE — Progress Notes (Signed)
[name] Progress Note    Date: Nov 17, 2016    Name: Ardean Melroy [facility]    MRN: [id number]         DOB: 1945-10-29      Mr. Robarts was assessed and education was provided.     Mr. Mcmiller vitals were reviewed and patient was observed for 5 minutes prior to treatment.   Visit Vitals   ??? BP 96/57 (BP 1 Location: Left arm, BP Patient Position: At rest;Sitting)   ??? Pulse 82   ??? Temp 97.5 ??F (36.4 ??C)   ??? Resp 16       Lab results were obtained and reviewed.  Recent Results (from the past 12 hour(s))   CBC WITH 3 PART DIFF    Collection Time: 11/17/16  2:02 PM   Result Value Ref Range    WBC 2.9 (L) 4.5 - 13.0 K/uL    RBC 3.33 (L) 4.10 - 5.10 M/uL    HGB 8.6 (L) 12.0 - 16.0 g/dL    HCT 29.2 (L) 36 - 48 %    MCV 87.7 78 - 102 FL    MCH 25.8 25.0 - 35.0 PG    MCHC 29.5 (L) 31 - 37 g/dL    RDW 15.3 (H) 11.5 - 14.5 %    PLATELET 83 (L) 140 - 440 K/uL    NEUTROPHILS 75 (H) 40 - 70 %    MIXED CELLS 10 0.1 - 17 %    LYMPHOCYTES 15 14 - 44 %    ABS. NEUTROPHILS 2.2 1.8 - 9.5 K/UL    ABS. MIXED CELLS 0.3 0.0 - 2.3 K/uL    ABS. LYMPHOCYTES 0.4 (L) 1.1 - 5.9 K/UL    DF AUTOMATED         Mr Ulbrich presented today to get his Procrit after office visit with Dr Nadyne Coombes.  New orders were received to change his Procrit from every 4 weeks to every 2 weeks.  I called [phone] and was told that he needs a new auth due to this change in frequency.  I explained this to Mr Tippin and he verbalized understanding.  He is waiting for [place] to get in touch with him when his Josem Kaufmann is completed.      Patient armband removed and shredded.    Mr. Handy was discharged from [facility] in stable condition at 1625. He is to return on 12/01/16 at 0900 for his next appointment for CBC/Procrit although this will most likely be changed when his Procrit Josem Kaufmann is completed and we call him to make an earlier appt.    Lynnda Shields, RN  Nov 17, 2016  4:38 PM

## 2016-11-21 ENCOUNTER — Other Ambulatory Visit: Payer: Self-pay | Admitting: Gastroenterology

## 2016-11-23 NOTE — Telephone Encounter (Signed)
Pt has moved to [place].

## 2016-11-23 NOTE — Telephone Encounter (Signed)
Please tell the patient we haven't seen him in over 2 years. I can send in a limited refill but will need a followup OV for further refills.

## 2016-11-24 MED ORDER — SUCRALFATE 1 GRAM TAB
1 gram | ORAL_TABLET | ORAL | 0 refills | Status: DC
Start: 2016-11-24 — End: 2016-12-22

## 2016-11-25 NOTE — Telephone Encounter (Signed)
I feel if he has moved to [place] and is unable to be seen in the office, we can continue with the limited refill which should give him time to find a GI provider close to him as we can't indefinitely prescribe medications without seeing him.  Will forward to CM for her input.

## 2016-11-29 ENCOUNTER — Encounter: Payer: MEDICARE | Primary: Legal Medicine

## 2016-11-29 NOTE — Telephone Encounter (Signed)
I agree.  We should not refill any other prescriptions moving forward. Patient should have a GI doctor in his area.  I tried to call the patient to discuss, no answer

## 2016-12-01 ENCOUNTER — Encounter: Primary: Legal Medicine

## 2016-12-01 DIAGNOSIS — D469 Myelodysplastic syndrome, unspecified: Secondary | ICD-10-CM

## 2016-12-01 NOTE — Progress Notes (Signed)
[name] did NOT show up today, Wednesday, 12-01-16, for his 0900 [facility] appointment for CBC/Procrit injection. However, shortly after 0900, his wife called the [facility] and stated that she and her husband were sick with what they suspected was a stomach virus, and therefore, he would be unable to come for his appointment today. So, Mrs. Graves was made aware, that her husband's appointment has been rescheduled to next Tuesday, 12-07-16 at 1500, and she agreed to the appointment for her husband.

## 2016-12-01 NOTE — Progress Notes (Signed)
NN health screening:    Noted positive fit testing and now William Blanchard has his f/u colonoscopy consultation with Dr. Truman Hayward scheduled for June 11th. Will continue to follow.

## 2016-12-02 ENCOUNTER — Inpatient Hospital Stay: Payer: MEDICARE | Primary: Legal Medicine

## 2016-12-06 ENCOUNTER — Encounter: Attending: Family | Primary: Legal Medicine

## 2016-12-07 ENCOUNTER — Institutional Professional Consult (permissible substitution): Admit: 2016-12-07 | Discharge: 2016-12-07 | Payer: PRIVATE HEALTH INSURANCE | Primary: Legal Medicine

## 2016-12-07 ENCOUNTER — Inpatient Hospital Stay: Admit: 2016-12-07 | Primary: Legal Medicine

## 2016-12-07 ENCOUNTER — Inpatient Hospital Stay: Admit: 2016-12-07 | Payer: MEDICARE | Primary: Legal Medicine

## 2016-12-07 DIAGNOSIS — D696 Thrombocytopenia, unspecified: Secondary | ICD-10-CM

## 2016-12-07 MED ORDER — EPOETIN ALFA 40,000 UNIT/ML IJ SOLN
40000 unit/mL | Freq: Once | INTRAMUSCULAR | Status: AC
Start: 2016-12-07 — End: 2016-12-07
  Administered 2016-12-07: 20:00:00 via SUBCUTANEOUS

## 2016-12-07 MED ORDER — EPOETIN ALFA 20,000 UNIT/ML IJ SOLN
20000 unit/mL | Freq: Once | INTRAMUSCULAR | Status: AC
Start: 2016-12-07 — End: 2016-12-07
  Administered 2016-12-07: 20:00:00 via SUBCUTANEOUS

## 2016-12-07 NOTE — Progress Notes (Signed)
NN health screening:    Spoke with Ms Grabel as Mr. Schwenke was unavailable. Ms Lembcke stated he cancelled with Dr. Truman Hayward again due to "too many doctor's appointments this week but he plans to reschedule @ a later date." Ms Orvis thanked me for my call and we agreed I would follow remotely through his chart until he is rescheduled and if not rescheduled in the next few months I would call again.

## 2016-12-07 NOTE — Progress Notes (Addendum)
[facility] Progress Note    Date: December 07, 2016    Name: William Rigor [facility] Sr.    MRN: [id number]         DOB: Oct 11, 1945      William Blanchard arrived to [facility] at 1505 with wife at side    William Blanchard was assessed and education was provided.     William Blanchard vitals were reviewed.  Visit Vitals   ??? BP (!) 88/60 (BP 1 Location: Left arm, BP Patient Position: Sitting)   ??? Pulse 75   ??? Temp 98.4 ??F (36.9 ??C)   ??? Resp 18   ??? SpO2 100%       Blood drawn for labs via Right antecubital right, condition patent and no redness venipuncture x1 attempt    Lab results were obtained and reviewed. ( See scanned in results)    Hgb 8.4 and Hct 28.4    60,000 units Procrit was administered as ordered SQ in patient's Left upper arm     William Blanchard tolerated well without complaints.    William Blanchard was discharged from [place] in stable condition at 1555.  He is to return on 12/21/2016 at 1500 for his next appointment for labs and procrit  Olin Pia, RN  December 07, 2016

## 2016-12-08 MED ORDER — TAMSULOSIN SR 0.4 MG 24 HR CAP
0.4 mg | ORAL_CAPSULE | ORAL | 0 refills | Status: DC
Start: 2016-12-08 — End: 2017-03-31

## 2016-12-09 LAB — CBC WITH 3 PART DIFF
ABS. LYMPHOCYTES: 0.6 10*3/uL
ABS. MIXED CELLS: 0.4 10*3/uL (ref 0.2–1.2)
ABS. NEUTROPHILS: 2.8 10*3/uL
HCT: 28.4 %
HGB: 8.4 g/dL — ABNORMAL LOW (ref 13.0–16.0)
LYMPHOCYTES: 16 % (ref 12–49)
MCH: 25.1 PG (ref 25.0–35.0)
MCHC: 29.6 g/dL — ABNORMAL LOW (ref 31.0–37.0)
MCV: 84.8 FL (ref 78.0–98.0)
Mixed cells: 12 % (ref 3.2–16.9)
NEUTROPHILS: 72 % (ref 42–75)
PLATELET: 95 10*3/uL
RBC: 3.35 M/uL
RDW: 14.6 % — ABNORMAL HIGH (ref 11.5–14.5)
WBC: 3.8 10*3/uL

## 2016-12-10 ENCOUNTER — Encounter: Attending: Cardiovascular Disease | Primary: Legal Medicine

## 2016-12-13 NOTE — Telephone Encounter (Signed)
Tried to reach patient to schedule new patient appt for [place] office     [facility] to return call

## 2016-12-14 ENCOUNTER — Encounter

## 2016-12-17 NOTE — Telephone Encounter (Signed)
Spoke to patient wife, and appt was scheduled for [facility] office July 13. Patient requested that day and time.

## 2016-12-21 ENCOUNTER — Inpatient Hospital Stay: Admit: 2016-12-21 | Payer: MEDICARE | Primary: Legal Medicine

## 2016-12-21 ENCOUNTER — Inpatient Hospital Stay: Admit: 2016-12-21 | Primary: Legal Medicine

## 2016-12-21 ENCOUNTER — Institutional Professional Consult (permissible substitution): Admit: 2016-12-21 | Discharge: 2016-12-21 | Payer: PRIVATE HEALTH INSURANCE | Primary: Legal Medicine

## 2016-12-21 DIAGNOSIS — D696 Thrombocytopenia, unspecified: Secondary | ICD-10-CM

## 2016-12-21 LAB — CBC WITH 3 PART DIFF
ABS. LYMPHOCYTES: 0.3 10*3/uL — ABNORMAL LOW (ref 1.1–5.9)
ABS. MIXED CELLS: 0.2 10*3/uL (ref 0.0–2.3)
ABS. NEUTROPHILS: 3.7 10*3/uL (ref 1.8–9.5)
HCT: 29 % — ABNORMAL LOW (ref 36–48)
HGB: 8.5 g/dL — ABNORMAL LOW (ref 12.0–16.0)
LYMPHOCYTES: 8 % — ABNORMAL LOW (ref 14–44)
MCH: 24 PG — ABNORMAL LOW (ref 25.0–35.0)
MCHC: 29.3 g/dL — ABNORMAL LOW (ref 31–37)
MCV: 81.9 FL (ref 78–102)
Mixed cells: 4 % (ref 0.1–17)
NEUTROPHILS: 89 % — ABNORMAL HIGH (ref 40–70)
PLATELET: 98 10*3/uL — ABNORMAL LOW (ref 140–440)
RBC: 3.54 M/uL — ABNORMAL LOW (ref 4.10–5.10)
RDW: 14.7 % — ABNORMAL HIGH (ref 11.5–14.5)
WBC: 4.2 10*3/uL — ABNORMAL LOW (ref 4.5–13.0)

## 2016-12-21 MED ORDER — EPOETIN ALFA 40,000 UNIT/ML IJ SOLN
40000 unit/mL | Freq: Once | INTRAMUSCULAR | Status: AC
Start: 2016-12-21 — End: 2016-12-21
  Administered 2016-12-21: 20:00:00 via SUBCUTANEOUS

## 2016-12-21 MED ORDER — EPOETIN ALFA 20,000 UNIT/ML IJ SOLN
20000 unit/mL | Freq: Once | INTRAMUSCULAR | Status: AC
Start: 2016-12-21 — End: 2016-12-21
  Administered 2016-12-21: 20:00:00 via SUBCUTANEOUS

## 2016-12-21 MED ORDER — NORTRIPTYLINE 25 MG CAP
25 mg | ORAL_CAPSULE | ORAL | 0 refills | Status: DC
Start: 2016-12-21 — End: 2016-12-31

## 2016-12-21 MED FILL — PROCRIT 20,000 UNIT/ML INJECTION SOLUTION: 20000 unit/mL | INTRAMUSCULAR | Qty: 1

## 2016-12-21 MED FILL — PROCRIT 40,000 UNIT/ML INJECTION SOLUTION: 40000 unit/mL | INTRAMUSCULAR | Qty: 1

## 2016-12-21 NOTE — Progress Notes (Signed)
[facility] Progress Note    Date: December 21, 2016    Name: William Tesch [facility] Sr.    MRN: [id number]         DOB: 1946/04/07      William Blanchard arrived to [facility] at 1510 with wife at side. Denies any improvement from recent procrit injection, also denies any reactions or complications.  Pt states his back is causing him pain " 6 on a 10 point scale".  He sees a Psychologist, sport and exercise in a few weeks and was concerned if Procrit would interfere with surgery.  Reassured patient that the surgeon and will contact Dr. Nadyne Coombes if there is a concern about his Procrit, but usually is not a contraindication of surgery.     William Blanchard was assessed and education was provided.     William Blanchard vitals were reviewed.  Visit Vitals   ??? BP 105/65 (BP 1 Location: Left arm, BP Patient Position: Sitting)   ??? Pulse 75   ??? Temp 98.5 ??F (36.9 ??C)   ??? Resp 18   ??? SpO2 100%       Blood drawn for labs via Right antecubital right, condition patent and no redness venipuncture x1 attempt    Lab results were obtained and reviewed.     Recent Results (from the past 12 hour(s))   CBC WITH 3 PART DIFF    Collection Time: 12/21/16  3:12 PM   Result Value Ref Range    WBC 4.2 (L) 4.5 - 13.0 K/uL    RBC 3.54 (L) 4.10 - 5.10 M/uL    HGB 8.5 (L) 12.0 - 16.0 g/dL    HCT 29.0 (L) 36 - 48 %    MCV 81.9 78 - 102 FL    MCH 24.0 (L) 25.0 - 35.0 PG    MCHC 29.3 (L) 31 - 37 g/dL    RDW 14.7 (H) 11.5 - 14.5 %    PLATELET 98 (L) 140 - 440 K/uL    NEUTROPHILS 89 (H) 40 - 70 %    MIXED CELLS 4 0.1 - 17 %    LYMPHOCYTES 8 (L) 14 - 44 %    ABS. NEUTROPHILS 3.7 1.8 - 9.5 K/UL    ABS. MIXED CELLS 0.2 0.0 - 2.3 K/uL    ABS. LYMPHOCYTES 0.3 (L) 1.1 - 5.9 K/UL    DF AUTOMATED             60,000 units Procrit was administered as ordered SQ in patient's right  upper arm covered with a bandaid,per protocol    William Blanchard tolerated well without complaints.    William Blanchard was discharged from [place] in stable condition at 1545.  He is to return on 01/04/2017 at 1400 for his next  appointment for labs and procrit  Olin Pia, RN  December 21, 2016

## 2016-12-22 ENCOUNTER — Inpatient Hospital Stay: Admit: 2016-12-22 | Payer: MEDICARE | Primary: Legal Medicine

## 2016-12-22 ENCOUNTER — Ambulatory Visit
Admit: 2016-12-22 | Discharge: 2016-12-22 | Payer: PRIVATE HEALTH INSURANCE | Attending: Hematology & Oncology | Primary: Legal Medicine

## 2016-12-22 ENCOUNTER — Inpatient Hospital Stay: Admit: 2016-12-22 | Primary: Legal Medicine

## 2016-12-22 DIAGNOSIS — D508 Other iron deficiency anemias: Secondary | ICD-10-CM

## 2016-12-22 LAB — CBC WITH 3 PART DIFF
ABS. LYMPHOCYTES: 0.5 10*3/uL — ABNORMAL LOW (ref 1.1–5.9)
ABS. MIXED CELLS: 0.2 10*3/uL (ref 0.0–2.3)
ABS. NEUTROPHILS: 3.5 10*3/uL (ref 1.8–9.5)
HCT: 27.9 % — ABNORMAL LOW (ref 36–48)
HGB: 7.8 g/dL — ABNORMAL LOW (ref 12.0–16.0)
LYMPHOCYTES: 11 % — ABNORMAL LOW (ref 14–44)
MCH: 23 PG — ABNORMAL LOW (ref 25.0–35.0)
MCHC: 28 g/dL — ABNORMAL LOW (ref 31–37)
MCV: 82.3 FL (ref 78–102)
Mixed cells: 4 % (ref 0.1–17)
NEUTROPHILS: 85 % — ABNORMAL HIGH (ref 40–70)
PLATELET: 85 10*3/uL — ABNORMAL LOW (ref 140–440)
RBC: 3.39 M/uL — ABNORMAL LOW (ref 4.10–5.10)
RDW: 14.7 % — ABNORMAL HIGH (ref 11.5–14.5)
WBC: 4.2 10*3/uL — ABNORMAL LOW (ref 4.5–13.0)

## 2016-12-22 MED ORDER — SUCRALFATE 1 GRAM TAB
1 gram | ORAL_TABLET | ORAL | 0 refills | Status: DC
Start: 2016-12-22 — End: 2017-01-20

## 2016-12-22 NOTE — Patient Instructions (Addendum)
Thrombocytopenia: Care Instructions  Your Care Instructions    Thrombocytopenia is a low number of platelets in the blood. Platelets are the cells that help blood clot. If you don't have enough of them, your blood cannot clot well. So it is harder to stop bleeding.  You may have low platelets because your bone marrow does not make them. Or your body's defenses (immune system) may destroy them.  Having an enlarged spleen can also reduce the number of platelets in your blood. This is because they can get trapped in the enlarged spleen.  Some diseases or medicines may also cause low platelets. But platelets may go back to normal levels if the disease is treated or the medicine is stopped.  You may not need treatment if your problem is mild. If you do need treatment, you may have platelets added to your blood. Or you may get medicine to stop the loss of platelets or help your body make them.  Follow-up care is a key part of your treatment and safety. Be sure to make and go to all appointments, and call your doctor if you are having problems. It's also a good idea to know your test results and keep a list of the medicines you take.  How can you care for yourself at home?  ?? Be safe with medicines. Take your medicines exactly as prescribed. Call your doctor if you think you are having a problem with your medicine.  ?? Do not take aspirin or anti-inflammatory medicines unless your doctor says it is okay. Examples are ibuprofen (Advil, Motrin) and naproxen (Aleve). They may increase the risk of bleeding.  ?? Avoid contact sports or activities that could cause you to fall.  When should you call for help?  Call 911 anytime you think you may need emergency care. For example, call if:  ? ?? You passed out (lost consciousness).   ? ?? You have signs of severe bleeding, which includes:  ?? You have a severe headache that is different from past headaches.  ?? You vomit blood or what looks like coffee grounds.   ?? Your stools are maroon or very bloody.   ?Call your doctor now or seek immediate medical care if:  ? ?? You are dizzy or lightheaded, or you feel like you may faint.   ? ?? You have abnormal bleeding, such as:  ?? Your stools are black and look like tar, or they have streaks of blood.  ?? You have blood in your urine.  ?? You have joint pain.  ?? You have bruises or blood spots under your skin.   ?Watch closely for changes in your health, and be sure to contact your doctor if:  ? ?? You do not get better as expected.   Where can you learn more?  Go to [email or web address].  Enter [phone] in the search box to learn more about "Thrombocytopenia: Care Instructions."  Current as of: April 10, 2015  Content Version: 11.4  ?? 2006-2017 Healthwise, Inc[employer]. Care instructions adapted under license by [employer] (which disclaims liability or warranty for this information). If you have questions about a medical condition or this instruction, always ask your healthcare professional. [place] any warranty or liability for your use of this information.       Myelodysplastic Syndromes: Care Instructions  Your Care Instructions  Myelodysplastic syndromes, also called MDS, are a group of rare conditions in which the bone marrow does not make enough healthy blood cells. Normally,  the bone marrow makes red blood cells, white blood cells, and platelets. These cells carry oxygen in the blood, help the body fight infections, and help the blood clot. With MDS, you may feel weak and tired, get infections often, and bruise easily, although symptoms tend to vary.  MDS is a form of blood cancer. In some cases, MDS can turn into acute myeloid leukemia (AML), another type of cancer. Some people develop MDS after treatment for cancer or exposure to pesticides or other chemicals. But in most cases, the cause of MDS is not known.   Your doctor will use the results of blood tests to guide your treatment. There are many types of MDS, with different treatment plans for each. If you have enough red blood cells and are feeling all right, you may not need active treatment, but you and your doctor will want to watch your condition carefully. If you start feeling lightheaded and have no energy, you may need a blood transfusion. Your doctor also may give you antibiotics to prevent or treat infection.  Follow-up care is a key part of your treatment and safety. Be sure to make and go to all appointments, and call your doctor if you are having problems. It's also a good idea to know your test results and keep a list of the medicines you take.  How can you care for yourself at home?  ?? Take your medicines exactly as prescribed. Call your doctor if you think you are having a problem with your medicine. You will get more details on the specific medicines your doctor prescribes.  ?? If your doctor prescribed antibiotics, take them as directed. Do not stop taking them just because you feel better. You need to take the full course of antibiotics. If you have side effects from antibiotics, tell your doctor.  ?? Take steps to control your stress and workload. Learn relaxation techniques.  ?? Share your feelings. Stress and tension affect our emotions. By expressing your feelings to others, you may be able to understand and cope with them.  ?? Consider joining a support group. Talking about a problem with your spouse, a good friend, or other people with similar problems is a good way to reduce tension and stress.  ?? Express yourself with art. Try writing, crafts, dance, or art to relieve stress.  ?? Be kind to your body and mind. Getting enough sleep, eating a healthy diet, and taking time to do things you enjoy can contribute to an overall feeling of balance in your life and help reduce stress.  ?? Get help if you need it. Discuss your concerns with your doctor or  counselor.  ?? Do not smoke. Smoking can make blood problems worse. If you need help quitting, talk to your doctor about stop-smoking programs and medicines. These can increase your chances of quitting for good.  ?? If you have not already done so, prepare a list of advance directives. Advance directives are instructions to your doctor and family members about what kind of care you want if you become unable to speak or express yourself.  ?? Call the [place] [phone]) or visit its website at [facility]org for more information.  When should you call for help?  Call 911 anytime you think you may need emergency care. For example, call if:  ? ?? You passed out (lost consciousness).   ?Call your doctor now or seek immediate medical care if:  ? ?? You have a fever.   ? ??  You have abnormal bleeding.   ? ?? You have new or worse pain.   ? ?? You think you have an infection.   ? ?? You have new symptoms, such as a cough, belly pain, vomiting, diarrhea, or a rash.   ?Watch closely for changes in your health, and be sure to contact your doctor if:  ? ?? You are much more tired than usual.   ? ?? You have swollen glands in your armpits, groin, or neck.   ? ?? You do not get better as expected.   Where can you learn more?  Go to [email or web address].at.  Enter [phone] in the search box to learn more about "Myelodysplastic Syndromes: Care Instructions."  Current as of: Nov 07, 2015  Content Version: 11.4  ?? 2006-2017 Healthwise, Incorporated. Care instructions adapted under license by [employer] (which disclaims liability or warranty for this information). If you have questions about a medical condition or this instruction, always ask your healthcare professional. [place] any warranty or liability for your use of this information.

## 2016-12-22 NOTE — Progress Notes (Signed)
Hematology/Oncology  Progress Note    Name: William Correa Sr.  Date: 12/22/2016  DOB: 24-Oct-1945    PCP: [name], MD     [name] is a 71 y.o.-year-old man who has myelodysplastic syndrome, chronic leukopenia, and severe anemia.  Current therapy: Procrit 60,000 units every 2 weeks when the hemoglobin is below 10 g/dL.    Subjective:     [name] is a 71 year old man who has myelodysplastic syndrome with severe anemia.  He also has chronic leukopenia and thrombocytopenia.  He is currently receiving Procrit 60,000 units every 2 weeks.  Today he is complaining of some fatigue and weakness.  His wife reports that although he is eating well he is continuing to lose weight and he is hypersomnolent, sleeping all of the time.    Past medical history, family history, and social history: these were reviewed and remains unchanged.    Past Medical History:   Diagnosis Date   ??? Anemia    ??? Anxiety    ??? Chronic pain    ??? Cirrhosis of liver ([place])    ??? Diabetes ([place])    ??? GERD (gastroesophageal reflux disease)    ??? Hyperlipemia    ??? Hypertension    ??? Hypotension    ??? Migraine    ??? Other cirrhosis of liver ([place])    ??? Stroke [facility])     had 3 strokes    ??? Thrombocytopenia ([place])      Past Surgical History:   Procedure Laterality Date   ??? HX CHOLECYSTECTOMY     ??? HX ORTHOPAEDIC      R hand sx   ??? HX OTHER SURGICAL      Hand surgery- Nail gun went through finger     Social History     Social History   ??? Marital status: MARRIED     Spouse name: N/A   ??? Number of children: N/A   ??? Years of education: N/A     Occupational History   ??? Not on file.     Social History Main Topics   ??? Smoking status: Former Smoker   ??? Smokeless tobacco: Never Used      Comment: quit years ago   ??? Alcohol use No   ??? Drug use: No   ??? Sexual activity: Yes     Partners: Female, Male     Other Topics Concern   ??? Not on file     Social History Narrative    ** Merged History Encounter **          Family History   Problem Relation Age of Onset    ??? Alzheimer Mother    ??? Diabetes Mother    ??? Cancer Mother      colon cancer    ??? Hypertension Father    ??? Heart Disease Father    ??? Cancer Father      prostate, lung cancer    ??? Diabetes Sister    ??? Heart Disease Sister    ??? Diabetes Brother      Current Outpatient Prescriptions   Medication Sig Dispense Refill   ??? sucralfate (CARAFATE) 1 gram tablet TAKE 1 TABLET BY MOUTH FOUR TIMES DAILY WITH MEALS AND AT BEDTIME 120 Tab 0   ??? nortriptyline (PAMELOR) 25 mg capsule TAKE 1 CAPSULE BY MOUTH NIGHTLY EVERY OTHER NIGHT THEN STOP 16 Cap 0   ??? tamsulosin (FLOMAX) 0.4 mg capsule TAKE 1 CAPSULE BY MOUTH EVERY DAY 90  Cap 0   ??? metFORMIN (GLUCOPHAGE) 500 mg tablet TAKE 2 TABLETS BY MOUTH TWICE DAILY WITH MEALS(GENERIC FOR GLUCOPHAGE) 360 Tab 0   ??? rosuvastatin (CRESTOR) 10 mg tablet Take 1 Tab by mouth nightly for 90 days. 90 Tab 1   ??? oxyCODONE IR (ROXICODONE) 20 mg immediate release tablet TK 1 T PO Q 6 HOURS  0   ??? LORazepam (ATIVAN) 1 mg tablet Take 1 Tab by mouth every eight (8) hours as needed for Anxiety for up to 90 days. Max Daily Amount: 3 mg. 90 Tab 0   ??? triamcinolone acetonide (KENALOG) 0.1 % topical cream Apply  to affected area two (2) times a day. use thin layer 453.6 g 0   ??? pantoprazole (PROTONIX) 40 mg tablet TAKE 1 TABLET BY MOUTH DAILY 30 Tab 0   ??? aspirin 81 mg chewable tablet Take 81 mg by mouth daily.     ??? codeine-butalbital-acetaminophen-caffeine (FIORICET WITH CODEINE) 50-325-40-30 mg capsule Take  by mouth.     ??? MULTIVIT-MIN/FA/LYCOPEN/LUTEIN (CENTRUM SILVER MEN PO) Take  by mouth.     ??? cholecalciferol, vitamin D3, (VITAMIN D3) 2,000 unit tab Take  by mouth.     ??? B.infantis-B.ani-B.long-B.bifi (PROBIOTIC 4X) 10-15 mg TbEC Take  by mouth.     ??? topiramate (TOPAMAX) 100 mg tablet Take 1 Tab by mouth daily. 90 Tab 3   ??? gemfibrozil (LOPID) 600 mg tablet TAKE 1 TABLET BY MOUTH TWICE DAILY 180 Tab 3   ??? prochlorperazine (COMPAZINE) 10 mg tablet Take 0.5 Tabs by mouth every  six (6) hours as needed. 30 Tab 5   ??? citalopram (CELEXA) 40 mg tablet Take 1 Tab by mouth daily. 90 Tab 3   ??? propranolol (INDERAL) 20 mg tablet Take  by mouth three (3) times daily.     ??? metFORMIN (GLUCOPHAGE) 500 mg tablet Take  by mouth two (2) times daily (with meals).     ??? lactulose (CHRONULAC) 10 gram/15 mL solution Take  by mouth three (3) times daily.     ??? oxyCODONE ER (OXYCONTIN) 20 mg ER tablet Take 20 mg by mouth every twelve (12) hours.     ??? cyclobenzaprine (FLEXERIL) 10 mg tablet Take  by mouth three (3) times daily as needed for Muscle Spasm(s).         Review of Systems  Constitutional: The patient has no acute distress or discomfort.  HEENT: The patient denies recent head trauma, eye pain, blurred vision,  hearing deficit, oropharyngeal mucosal pain or lesions, and the patient denies throat pain or discomfort.  Lymphatics: The patient denies palpable peripheral lymphadenopathy.  Hematologic: The patient denies having bruising, bleeding, or progressive fatigue.  Respiratory: Patient denies having shortness of breath, cough, sputum production, fever, or dyspnea on exertion.  Cardiovascular: The patient denies having leg pain, leg swelling, heart palpitations, chest permit, chest pain, or lightheadedness.  The patient denies having dyspnea on exertion.  Gastrointestinal: The patient denies having nausea, emesis, or diarrhea. The patient denies having any hematemesis or blood in the stool.  Genitourinary: Patient denies having urinary urgency, frequency, or dysuria.  The patient denies having blood in the urine.  Psychological: The patient denies having symptoms of nervousness, anxiety, depression, or thoughts of harming self.  Skin: Patient denies having skin rashes, skin, ulcerations, or unexplained itching or pruritus.  Musculoskeletal: The patient denies having pain in the joints or bones.      Objective:     Visit Vitals   ???  BP 100/57   ??? Pulse 73   ??? Temp 98.6 ??F (37 ??C) (Oral)    ??? Wt 62.1 kg (137 lb)   ??? BMI 20.23 kg/m2     ECOG PS=0  Physical Exam:   Gen. Appearance: The patient is in no acute distress.  Skin: There is no bruise or rash.  HEENT: The exam is unremarkable.  Neck: Supple without lymphadenopathy or thyromegaly.  Lungs: Clear to auscultation and percussion; there are no wheezes or rhonchi.  Heart: Regular rate and rhythm; there are no murmurs, gallops, or rubs.  Abdomen: Bowel sounds are present and normal.  There is no guarding, tenderness, or hepatosplenomegaly.  Extremities: There is no clubbing, cyanosis, or edema.  Neurologic: There are no focal neurologic deficits.  Lymphatics: There is no palpable peripheral lymphadenopathy. Musculoskeletal: The patient has full range of motion at all joints.  There is no evidence of joint deformity or effusions.  There is no focal joint tenderness.  Psychological/psychiatric: There is no clinical evidence of anxiety, depression, or melancholy.    Lab data:      Results for orders placed or performed during the hospital encounter of 12/22/16   CBC WITH 3 PART DIFF     Status: Abnormal   Result Value Ref Range Status    WBC 4.2 (L) 4.5 - 13.0 K/uL Final    RBC 3.39 (L) 4.10 - 5.10 M/uL Final    HGB 7.8 (L) 12.0 - 16.0 g/dL Final    HCT 27.9 (L) 36 - 48 % Final    MCV 82.3 78 - 102 FL Final    MCH 23.0 (L) 25.0 - 35.0 PG Final    MCHC 28.0 (L) 31 - 37 g/dL Final    RDW 14.7 (H) 11.5 - 14.5 % Final    PLATELET 85 (L) 140 - 440 K/uL Final    NEUTROPHILS 85 (H) 40 - 70 % Final    MIXED CELLS 4 0.1 - 17 % Final    LYMPHOCYTES 11 (L) 14 - 44 % Final    ABS. NEUTROPHILS 3.5 1.8 - 9.5 K/UL Final    ABS. MIXED CELLS 0.2 0.0 - 2.3 K/uL Final    ABS. LYMPHOCYTES 0.5 (L) 1.1 - 5.9 K/UL Final     Comment: Test performed at [facility]. Results Reviewed by Medical Director.    DF AUTOMATED   Final           Assessment:     1. Iron deficiency anemia secondary to inadequate dietary iron intake    2. Chronic leukopenia     3. Thrombocytopenia ([place])    4. 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 Myelodysplastic syndrome ([place])      Plan:   Myelodysplastic syndrome, refractory anemia due to myelodysplastic syndrome/iron deficiency anemia: Have explained to the patient that his hemoglobin today is 7.8 g/dL with hematocrit of 27.9%.  He did receive Procrit at a dose of 60,000 units yesterday.  I will check his iron profile and ferritin levels at this time.  If his ferritin level is below 100 and we will need to give him intravenous iron to replenish his ferritin level.    Chronic leukopenia: The CBC from today shows that his WBC count is 4.2 with an absolute neutrophil count which is normal at 3.5.  However the absolute lymphocyte count is low at 0.5.  These will continue to be monitored.  Therapeutic intervention is not warranted.  Thrombocytopenia: The patient has a stable lytic count of 85,000.  No therapeutic intervention is warranted.    Follow-up in 2 months  Orders Placed This Encounter   ??? COMPLETE CBC & AUTO DIFF WBC   ??? InHouse CBC ([facility])     Standing Status:   Future     Number of Occurrences:   1     Standing Expiration Date:   12/28/7104   ??? METABOLIC PANEL, COMPREHENSIVE     Standing Status:   Future     Number of Occurrences:   1     Standing Expiration Date:   12/23/2017   ??? IRON PROFILE     Standing Status:   Future     Number of Occurrences:   1     Standing Expiration Date:   12/23/2017   ??? FERRITIN     Standing Status:   Future     Number of Occurrences:   1     Standing Expiration Date:   12/23/2017       Suzy Bouchard, MD  12/22/2016      Please note: This document has been produced using voice recognition software.  Unrecognized errors in transcription may be present.

## 2016-12-23 LAB — METABOLIC PANEL, COMPREHENSIVE
A-G Ratio: 1.3 (ref 0.8–1.7)
ALT (SGPT): 26 U/L (ref 16–61)
AST (SGOT): 18 U/L (ref 15–37)
Albumin: 4 g/dL (ref 3.4–5.0)
Alk. phosphatase: 146 U/L — ABNORMAL HIGH (ref 45–117)
Anion gap: 11 mmol/L (ref 3.0–18)
BUN/Creatinine ratio: 18 (ref 12–20)
BUN: 20 MG/DL — ABNORMAL HIGH (ref 7.0–18)
Bilirubin, total: 0.4 MG/DL (ref 0.2–1.0)
CO2: 21 mmol/L (ref 21–32)
Calcium: 8.7 MG/DL (ref 8.5–10.1)
Chloride: 105 mmol/L (ref 100–108)
Creatinine: 1.14 MG/DL (ref 0.6–1.3)
GFR est AA: >60 mL/min/{1.73_m2} (ref >60)
GFR est non-AA: >60 mL/min/{1.73_m2} (ref >60)
Globulin: 3 g/dL (ref 2.0–4.0)
Glucose: 228 mg/dL — ABNORMAL HIGH (ref 74–99)
Potassium: 4.5 mmol/L (ref 3.5–5.5)
Protein, total: 7 g/dL (ref 6.4–8.2)
Sodium: 137 mmol/L (ref 136–145)

## 2016-12-23 LAB — FERRITIN: Ferritin: 5 NG/ML — ABNORMAL LOW (ref 8–388)

## 2016-12-23 LAB — IRON PROFILE
Iron % saturation: 3 %
Iron: 14 ug/dL — ABNORMAL LOW (ref 50–175)
TIBC: 454 ug/dL — ABNORMAL HIGH (ref 250–450)

## 2016-12-23 MED ORDER — IRON SUCROSE 100 MG/5 ML IV SOLN
1005 mg iron/5 mL | Freq: Once | INTRAVENOUS | Status: AC
Start: 2016-12-23 — End: 2016-12-27
  Administered 2016-12-27: 17:00:00 via INTRAVENOUS

## 2016-12-23 MED FILL — VENOFER 100 MG IRON/5 ML INTRAVENOUS SOLUTION: 100 mg iron/5 mL | INTRAVENOUS | Qty: 12.5

## 2016-12-27 ENCOUNTER — Inpatient Hospital Stay: Admit: 2016-12-27 | Payer: MEDICARE | Primary: Legal Medicine

## 2016-12-27 DIAGNOSIS — D469 Myelodysplastic syndrome, unspecified: Secondary | ICD-10-CM

## 2016-12-27 MED ORDER — SODIUM CHLORIDE 0.9 % IV
INTRAVENOUS | Status: AC | PRN
Start: 2016-12-27 — End: 2016-12-28

## 2016-12-27 MED ORDER — SODIUM CHLORIDE 0.9 % IJ SYRG
INTRAMUSCULAR | Status: DC | PRN
Start: 2016-12-27 — End: 2016-12-31
  Administered 2016-12-27 (×2): via INTRAVENOUS

## 2016-12-27 MED FILL — SODIUM CHLORIDE 0.9 % IV: INTRAVENOUS | Qty: 250

## 2016-12-27 MED FILL — BD POSIFLUSH NORMAL SALINE 0.9 % INJECTION SYRINGE: INTRAMUSCULAR | Qty: 40

## 2016-12-27 NOTE — Progress Notes (Signed)
[facility] OPIC Progress Note    Date: December 27, 2016    Name: William Hodgkin [facility] Sr.    MRN: [id number]         DOB: [birth year]      [name] was assessed and education was provided.     [name] vitals were reviewed and patient was observed for 5 minutes prior to treatment.   Visit Vitals   ??? BP 98/56 (BP 1 Location: Left arm, BP Patient Position: Sitting)   ??? Pulse 86   ??? Temp 98.4 ??F (36.9 ??C)   ??? Resp 16   ??? SpO2 100%       Dose #1 of 4 Venofer 250 mg given IV after brisk blood return obtained.    Mr William Blanchard stayed for 30 min of uneventful observation after venofer.    [name] tolerated the infusion, and had no complaints.  Patient armband removed and shredded.    [name] was discharged from [place] in stable condition at 1540. He is to return on 01/04/17 at 1400 for his next appointment for CBC/Procrit Q 2 wks.    Lynnda Shields, RN  December 27, 2016

## 2016-12-28 MED ORDER — SODIUM CHLORIDE 0.9 % IV
1005 mg iron/5 mL | Freq: Once | INTRAVENOUS | Status: DC
Start: 2016-12-28 — End: 2016-12-30

## 2016-12-28 MED FILL — VENOFER 100 MG IRON/5 ML INTRAVENOUS SOLUTION: 100 mg iron/5 mL | INTRAVENOUS | Qty: 12.5

## 2016-12-30 MED FILL — VENOFER 100 MG IRON/5 ML INTRAVENOUS SOLUTION: 100 mg iron/5 mL | INTRAVENOUS | Qty: 50

## 2016-12-31 ENCOUNTER — Ambulatory Visit
Admit: 2016-12-31 | Discharge: 2016-12-31 | Payer: PRIVATE HEALTH INSURANCE | Attending: Legal Medicine | Primary: Legal Medicine

## 2016-12-31 ENCOUNTER — Inpatient Hospital Stay: Admit: 2016-12-31 | Payer: MEDICARE | Primary: Legal Medicine

## 2016-12-31 DIAGNOSIS — R350 Frequency of micturition: Secondary | ICD-10-CM

## 2016-12-31 DIAGNOSIS — N401 Enlarged prostate with lower urinary tract symptoms: Secondary | ICD-10-CM

## 2016-12-31 LAB — AMB POC URINALYSIS DIP STICK AUTO W/O MICRO
Bilirubin (UA POC): NEGATIVE
Glucose (UA POC): NEGATIVE
Ketones (UA POC): NEGATIVE
Leukocyte esterase (UA POC): NEGATIVE
Nitrites (UA POC): NEGATIVE
Protein (UA POC): NEGATIVE
Specific gravity (UA POC): 1.015 (ref 1.001–1.035)
Urobilinogen (UA POC): 0.2 (ref 0.2–1)
pH (UA POC): 7 (ref 4.6–8.0)

## 2016-12-31 MED ORDER — FINASTERIDE 5 MG TAB
5 mg | ORAL_TABLET | Freq: Every day | ORAL | 2 refills | Status: DC
Start: 2016-12-31 — End: 2017-04-15

## 2016-12-31 MED ORDER — BUTALBITAL-ACETAMINOPHEN-CAFFEINE 50 MG-325 MG-40 MG TAB
50-325-40 mg | ORAL_TABLET | Freq: Four times a day (QID) | ORAL | 0 refills | Status: DC | PRN
Start: 2016-12-31 — End: 2017-04-15

## 2016-12-31 MED ORDER — LORAZEPAM 1 MG TAB
1 mg | ORAL_TABLET | Freq: Three times a day (TID) | ORAL | 0 refills | Status: DC | PRN
Start: 2016-12-31 — End: 2017-02-14

## 2016-12-31 NOTE — Progress Notes (Signed)
William Blalock Uriarte Sr. is a 71 y.o. male presents to office for painful urineation for a couple of weeks    Medication list has been reviewed with William Burger Sr. and updated as of today's date     Health Maintenance items with a due date reviewed with patient:  Health Maintenance Due   Topic Date Due   ??? Hepatitis C Screening  07-14-1945   ??? FOOT EXAM Q1  03/19/1958   ??? MICROALBUMIN Q1  03/19/1958   ??? EYE EXAM RETINAL OR DILATED Q1  03/19/1958   ??? DTaP/Tdap/Td series (1 - Tdap) 03/19/1969   ??? ZOSTER VACCINE AGE 85>  01/17/2008   ??? GLAUCOMA SCREENING Q2Y  03/19/2013   ??? Pneumococcal 65+ High/Highest Risk (1 of 2 - PCV13) 03/19/2013   ??? HEMOGLOBIN A1C Q6M  12/28/2016

## 2016-12-31 NOTE — Progress Notes (Signed)
William Blalock Sow Sr.     Chief Complaint   Patient presents with   ??? Dysuria     Vitals:    12/31/16 0815   BP: 115/68   Pulse: 73   Resp: 17   Temp: 97.4 ??F (36.3 ??C)   TempSrc: Oral   SpO2: 98%   Weight: 137 lb (62.1 kg)   Height: '5\' 9"'$  (1.753 m)         HPI: This patient has multiple medical problems he has myelodysplasia is following with hematology oncology for Procrit and iron infusion.    He also has liver cirrhosis.    Patient has been having symptoms of enlarged prostate where he has to strain for urination, as well as he has pain post voiding.  He he has been on Flomax for many years, and he has not seen urology his PSA was normal about 6 months ago.      Patient has chronic back pain he follow-up with pain management and recently had the MRI and he has been referred to neurosurgery.    Past Medical History:   Diagnosis Date   ??? Anemia    ??? Anxiety    ??? Chronic pain    ??? Cirrhosis of liver ([place])    ??? Diabetes ([place])    ??? GERD (gastroesophageal reflux disease)    ??? Hyperlipemia    ??? Hypertension    ??? Hypotension    ??? Migraine    ??? Other cirrhosis of liver ([place])    ??? Stroke [facility])     had 3 strokes    ??? Thrombocytopenia ([place])      Past Surgical History:   Procedure Laterality Date   ??? HX CHOLECYSTECTOMY     ??? HX ORTHOPAEDIC      R hand sx   ??? HX OTHER SURGICAL      Hand surgery- Nail gun went through finger     Social History   Substance Use Topics   ??? Smoking status: Former Smoker   ??? Smokeless tobacco: Never Used      Comment: quit years ago   ??? Alcohol use No       Family History   Problem Relation Age of Onset   ??? Alzheimer Mother    ??? Diabetes Mother    ??? Cancer Mother      colon cancer    ??? Hypertension Father    ??? Heart Disease Father    ??? Cancer Father      prostate, lung cancer    ??? Diabetes Sister    ??? Heart Disease Sister    ??? Diabetes Brother        Review of Systems   Constitutional: Negative for chills, fever, malaise/fatigue and weight loss.    HENT: Negative for congestion, ear discharge, ear pain, hearing loss, nosebleeds, sinus pain and sore throat.    Eyes: Negative for blurred vision, double vision and discharge.   Respiratory: Negative for cough, hemoptysis, sputum production, shortness of breath and wheezing.    Cardiovascular: Negative for chest pain, palpitations, claudication and leg swelling.   Gastrointestinal: Negative for abdominal pain, constipation, diarrhea, nausea and vomiting.   Genitourinary: Positive for frequency. Negative for dysuria and urgency.   Musculoskeletal: Positive for back pain. Negative for myalgias and neck pain.   Skin: Negative for itching and rash.   Neurological: Negative for dizziness, tingling, sensory change, speech change, focal weakness, weakness and headaches.   Psychiatric/Behavioral: Negative for depression, hallucinations,  substance abuse and suicidal ideas. The patient is nervous/anxious. The patient does not have insomnia.        Physical Exam   Constitutional: He is oriented to person, place, and time. He appears well-developed and well-nourished. No distress.   HENT:   Head: Normocephalic and atraumatic.   Mouth/Throat: No oropharyngeal exudate.   Eyes: Conjunctivae are normal. Pupils are equal, round, and reactive to light. Right eye exhibits no discharge. Left eye exhibits no discharge. No scleral icterus.   Neck: Normal range of motion. Neck supple. No thyromegaly present.   Cardiovascular: Normal rate, regular rhythm and normal heart sounds.    Pulmonary/Chest: Effort normal and breath sounds normal. No respiratory distress. He has no rales.   Abdominal: Soft. Bowel sounds are normal. He exhibits no mass. There is no tenderness. There is no rebound.   Musculoskeletal: Normal range of motion. He exhibits no edema, tenderness or deformity.   Lymphadenopathy:     He has no cervical adenopathy.   Neurological: He is alert and oriented to person, place, and time. No  cranial nerve deficit. Coordination normal.   Skin: Skin is warm and dry. No rash noted. He is not diaphoretic. No erythema.   Psychiatric: He has a normal mood and affect. Judgment and thought content normal.        Assessment and plan     1. Anxiety    - LORazepam (ATIVAN) 1 mg tablet; Take 1 Tab by mouth every eight (8) hours as needed for Anxiety for up to 90 days. Max Daily Amount: 3 mg.  Dispense: 90 Tab; Refill: 0    2. Urinary frequency  Urine is negative except for mild red blood cells  - AMB POC URINALYSIS DIP STICK AUTO W/O MICRO    3. Benign prostatic hyperplasia (BPH) with straining on urination  Patient is on Flomax 0.4 mg daily we will add Proscar 5 mg daily and referred to urology  - REFERRAL TO UROLOGY  - finasteride (PROSCAR) 5 mg tablet; Take 1 Tab by mouth daily.  Dispense: 30 Tab; Refill: 2    4. Essential hypertension  Well-controlled    5. Myelodysplastic syndrome [facility])  Patient is follow-up with hematology oncology and he is getting Procrit injection and every 2 months iron infusion  6. Migraine without status migrainosus, not intractable, unspecified migraine type    - butalbital-acetaminophen-caffeine (FIORICET, ESGIC) 50-325-40 mg per tablet; Take 1 Tab by mouth every six (6) hours as needed.  Dispense: 30 Tab; Refill: 0    Current Outpatient Prescriptions   Medication Sig Dispense Refill   ??? LORazepam (ATIVAN) 1 mg tablet Take 1 Tab by mouth every eight (8) hours as needed for Anxiety for up to 90 days. Max Daily Amount: 3 mg. 90 Tab 0   ??? butalbital-acetaminophen-caffeine (FIORICET, ESGIC) 50-325-40 mg per tablet Take 1 Tab by mouth every six (6) hours as needed. 30 Tab 0   ??? finasteride (PROSCAR) 5 mg tablet Take 1 Tab by mouth daily. 30 Tab 2   ??? sucralfate (CARAFATE) 1 gram tablet TAKE 1 TABLET BY MOUTH FOUR TIMES DAILY WITH MEALS AND AT BEDTIME 120 Tab 0   ??? tamsulosin (FLOMAX) 0.4 mg capsule TAKE 1 CAPSULE BY MOUTH EVERY DAY 90 Cap 0    ??? metFORMIN (GLUCOPHAGE) 500 mg tablet TAKE 2 TABLETS BY MOUTH TWICE DAILY WITH MEALS(GENERIC FOR GLUCOPHAGE) 360 Tab 0   ??? rosuvastatin (CRESTOR) 10 mg tablet Take 1 Tab by mouth nightly for 90 days. [place]  Tab 1   ??? oxyCODONE IR (ROXICODONE) 20 mg immediate release tablet TK 1 T PO Q 6 HOURS  0   ??? pantoprazole (PROTONIX) 40 mg tablet TAKE 1 TABLET BY MOUTH DAILY 30 Tab 0   ??? aspirin 81 mg chewable tablet Take 81 mg by mouth daily.     ??? MULTIVIT-MIN/FA/LYCOPEN/LUTEIN (CENTRUM SILVER MEN PO) Take  by mouth.     ??? cholecalciferol, vitamin D3, (VITAMIN D3) 2,000 unit tab Take  by mouth.     ??? B.infantis-B.ani-B.long-B.bifi (PROBIOTIC 4X) 10-15 mg TbEC Take  by mouth.     ??? topiramate (TOPAMAX) 100 mg tablet Take 1 Tab by mouth daily. 90 Tab 3   ??? gemfibrozil (LOPID) 600 mg tablet TAKE 1 TABLET BY MOUTH TWICE DAILY 180 Tab 3   ??? prochlorperazine (COMPAZINE) 10 mg tablet Take 0.5 Tabs by mouth every six (6) hours as needed. 30 Tab 5   ??? citalopram (CELEXA) 40 mg tablet Take 1 Tab by mouth daily. 90 Tab 3   ??? propranolol (INDERAL) 20 mg tablet Take  by mouth three (3) times daily.     ??? metFORMIN (GLUCOPHAGE) 500 mg tablet Take  by mouth two (2) times daily (with meals).     ??? lactulose (CHRONULAC) 10 gram/15 mL solution Take  by mouth three (3) times daily.     ??? cyclobenzaprine (FLEXERIL) 10 mg tablet Take  by mouth three (3) times daily as needed for Muscle Spasm(s).         Patient Active Problem List    Diagnosis Date Noted   ??? Benign prostatic hyperplasia (BPH) with straining on urination 12/31/2016   ??? 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 Myelodysplastic syndrome ([place]) 12/22/2016   ??? 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 Iron deficiency anemia secondary to inadequate dietary iron intake 09/23/2016   ??? Chronic leukopenia 09/23/2016   ??? Thrombocytopenia ([place]) 09/23/2016   ??? 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 Type II diabetes mellitus ([place]) 08/03/2016    ??? HTN (hypertension) 08/03/2016   ??? 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 Stroke ([place]) 08/03/2016   ??? Compressed vertebrae ([place]) 08/03/2016   ??? Chronic back pain greater than 3 months duration 08/03/2016   ??? Hyperlipemia 08/03/2016   ??? Anxiety 08/03/2016   ??? Depression 06/30/2016   ??? Iron deficiency anemia 04/22/2016   ??? NAFLD (nonalcoholic fatty liver disease) 04/22/2016   ??? Other cirrhosis of liver ([place]) 04/22/2016   ??? Chronic anemia 03/18/2016   ??? Controlled type 2 diabetes mellitus without complication, without long-term current use of insulin ([place]) 03/18/2016   ??? Essential hypertension 03/18/2016   ??? Hyperlipidemia 03/18/2016   ??? History of stroke 03/18/2016   ??? Anxiety 03/18/2016   ??? Chronic pain 03/18/2016     Results for orders placed or performed in visit on 12/31/16   AMB POC URINALYSIS DIP STICK AUTO W/O MICRO   Result Value Ref Range    Color (UA POC) Yellow     Clarity (UA POC) Clear     Glucose (UA POC) Negative Negative    Bilirubin (UA POC) Negative Negative    Ketones (UA POC) Negative Negative    Specific gravity (UA POC) 1.015 1.001 - 1.035    Blood (UA POC) Trace Negative    pH (UA POC) 7 4.6 - 8.0    Protein (UA POC) Negative Negative    Urobilinogen (UA POC) 0.2 mg/dL 0.2 -  1    Nitrites (UA POC) Negative Negative    Leukocyte esterase (UA POC) Negative Negative     Office Visit on 12/31/2016   Component Date Value Ref Range Status   ??? Color (UA POC) 12/31/2016 Yellow   Final   ??? Clarity (UA POC) 12/31/2016 Clear   Final   ??? Glucose (UA POC) 12/31/2016 Negative  Negative Final   ??? Bilirubin (UA POC) 12/31/2016 Negative  Negative Final   ??? Ketones (UA POC) 12/31/2016 Negative  Negative Final   ??? Specific gravity (UA POC) 12/31/2016 1.015  1.001 - 1.035 Final   ??? Blood (UA POC) 12/31/2016 Trace  Negative Final   ??? pH (UA POC) 12/31/2016 7  4.6 - 8.0 Final   ??? Protein (UA POC) 12/31/2016 Negative  Negative Final   ??? Urobilinogen (UA POC) 12/31/2016 0.2 mg/dL  0.2 - 1 Final    ??? Nitrites (UA POC) 12/31/2016 Negative  Negative Final   ??? Leukocyte esterase (UA POC) 12/31/2016 Negative  Negative Final   Hospital Outpatient Visit on 12/22/2016   Component Date Value Ref Range Status   ??? Sodium 12/22/2016 137  136 - 145 mmol/L Final   ??? Potassium 12/22/2016 4.5  3.5 - 5.5 mmol/L Final   ??? Chloride 12/22/2016 105  100 - 108 mmol/L Final   ??? CO2 12/22/2016 21  21 - 32 mmol/L Final   ??? Anion gap 12/22/2016 11  3.0 - 18 mmol/L Final   ??? Glucose 12/22/2016 228* 74 - 99 mg/dL Final   ??? BUN 12/22/2016 20* 7.0 - 18 MG/DL Final   ??? Creatinine 12/22/2016 1.14  0.6 - 1.3 MG/DL Final   ??? BUN/Creatinine ratio 12/22/2016 18  12 - 20   Final   ??? GFR est AA 12/22/2016 >60  >60 ml/min/1.71m Final   ??? GFR est non-AA 12/22/2016 >60  >60 ml/min/1.752mFinal    Comment: (NOTE)  Estimated GFR is calculated using the Modification of Diet in Renal   Disease (MDRD) Study equation, reported for both African Americans   (GFRAA) and non-African Americans (GFRNA), and normalized to 1.731m body surface area. The physician must decide which value applies to   the patient. The MDRD study equation should only be used in   individuals age 76 19 older. It has not been validated for the   following: pregnant women, patients with serious comorbid conditions,   or on certain medications, or persons with extremes of body size,   muscle mass, or nutritional status.     ??? Calcium 12/22/2016 8.7  8.5 - 10.1 MG/DL Final   ??? Bilirubin, total 12/22/2016 0.4  0.2 - 1.0 MG/DL Final   ??? ALT (SGPT) 12/22/2016 26  16 - 61 U/L Final   ??? AST (SGOT) 12/22/2016 18  15 - 37 U/L Final   ??? Alk. phosphatase 12/22/2016 146* 45 - 117 U/L Final   ??? Protein, total 12/22/2016 7.0  6.4 - 8.2 g/dL Final   ??? Albumin 12/22/2016 4.0  3.4 - 5.0 g/dL Final   ??? Globulin 12/22/2016 3.0  2.0 - 4.0 g/dL Final   ??? A-G Ratio 12/22/2016 1.3  0.8 - 1.7   Final   ??? Iron 12/22/2016 14* 50 - 175 ug/dL Final     Patients receiving metal-binding drugs (e.g. deferoxamine) may show spuriously depressed iron values, as chelated iron may not properly react in the iron assay.   ??? TIBC 12/22/2016 454* 250 - 450 ug/dL Final   ???  Iron % saturation 12/22/2016 3  % Final   ??? Ferritin 12/22/2016 5* 8 - 388 NG/ML Final   Hospital Outpatient Visit on 12/22/2016   Component Date Value Ref Range Status   ??? WBC 12/22/2016 4.2* 4.5 - 13.0 K/uL Final   ??? RBC 12/22/2016 3.39* 4.10 - 5.10 M/uL Final   ??? HGB 12/22/2016 7.8* 12.0 - 16.0 g/dL Final   ??? HCT 12/22/2016 27.9* 36 - 48 % Final   ??? MCV 12/22/2016 82.3  78 - 102 FL Final   ??? MCH 12/22/2016 23.0* 25.0 - 35.0 PG Final   ??? MCHC 12/22/2016 28.0* 31 - 37 g/dL Final   ??? RDW 12/22/2016 14.7* 11.5 - 14.5 % Final   ??? PLATELET 12/22/2016 85* 140 - 440 K/uL Final   ??? NEUTROPHILS 12/22/2016 85* 40 - 70 % Final   ??? MIXED CELLS 12/22/2016 4  0.1 - 17 % Final   ??? LYMPHOCYTES 12/22/2016 11* 14 - 44 % Final   ??? ABS. NEUTROPHILS 12/22/2016 3.5  1.8 - 9.5 K/UL Final   ??? ABS. MIXED CELLS 12/22/2016 0.2  0.0 - 2.3 K/uL Final   ??? ABS. LYMPHOCYTES 12/22/2016 0.5* 1.1 - 5.9 K/UL Final    Test performed at [place] Location. Results Reviewed by Medical Director.   ??? DF 12/22/2016 AUTOMATED    Final   Hospital Outpatient Visit on 12/21/2016   Component Date Value Ref Range Status   ??? WBC 12/21/2016 4.2* 4.5 - 13.0 K/uL Final   ??? RBC 12/21/2016 3.54* 4.10 - 5.10 M/uL Final   ??? HGB 12/21/2016 8.5* 12.0 - 16.0 g/dL Final   ??? HCT 12/21/2016 29.0* 36 - 48 % Final   ??? MCV 12/21/2016 81.9  78 - 102 FL Final   ??? MCH 12/21/2016 24.0* 25.0 - 35.0 PG Final   ??? MCHC 12/21/2016 29.3* 31 - 37 g/dL Final   ??? RDW 12/21/2016 14.7* 11.5 - 14.5 % Final   ??? PLATELET 12/21/2016 98* 140 - 440 K/uL Final   ??? NEUTROPHILS 12/21/2016 89* 40 - 70 % Final   ??? MIXED CELLS 12/21/2016 4  0.1 - 17 % Final   ??? LYMPHOCYTES 12/21/2016 8* 14 - 44 % Final   ??? ABS. NEUTROPHILS 12/21/2016 3.7  1.8 - 9.5 K/UL Final    ??? ABS. MIXED CELLS 12/21/2016 0.2  0.0 - 2.3 K/uL Final   ??? ABS. LYMPHOCYTES 12/21/2016 0.3* 1.1 - 5.9 K/UL Final    Test performed at [place] Location. Results Reviewed by Medical Director.   ??? DF 12/21/2016 AUTOMATED    Final   Hospital Outpatient Visit on 12/07/2016   Component Date Value Ref Range Status   ??? WBC 12/07/2016 3.8  K/uL Final   ??? RBC 12/07/2016 3.35  M/uL Final   ??? HGB 12/07/2016 8.4* 13.0 - 16.0 g/dL Final   ??? HCT 12/07/2016 28.4  % Final   ??? MCV 12/07/2016 84.8  78.0 - 98.0 FL Final   ??? MCH 12/07/2016 25.1  25.0 - 35.0 PG Final   ??? MCHC 12/07/2016 29.6* 31.0 - 37.0 g/dL Final   ??? RDW 12/07/2016 14.6* 11.5 - 14.5 % Final   ??? PLATELET 12/07/2016 95  K/uL Final   ??? NEUTROPHILS 12/07/2016 72  42 - 75 % Final   ??? MIXED CELLS 12/07/2016 12  3.2 - 16.9 % Final   ??? LYMPHOCYTES 12/07/2016 16  12 - 49 % Final   ??? ABS. NEUTROPHILS 12/07/2016 2.8  K/UL Final   ??? ABS. MIXED CELLS 12/07/2016 0.4  0.2 - 1.2 K/uL Final   ??? ABS. LYMPHOCYTES 12/07/2016 0.6  K/UL Final   ??? DF 12/07/2016 AUTOMATED    Final   Hospital Outpatient Visit on 11/17/2016   Component Date Value Ref Range Status   ??? WBC 11/17/2016 2.9* 4.5 - 13.0 K/uL Final   ??? RBC 11/17/2016 3.33* 4.10 - 5.10 M/uL Final   ??? HGB 11/17/2016 8.6* 12.0 - 16.0 g/dL Final   ??? HCT 11/17/2016 29.2* 36 - 48 % Final   ??? MCV 11/17/2016 87.7  78 - 102 FL Final   ??? MCH 11/17/2016 25.8  25.0 - 35.0 PG Final   ??? MCHC 11/17/2016 29.5* 31 - 37 g/dL Final   ??? RDW 11/17/2016 15.3* 11.5 - 14.5 % Final   ??? PLATELET 11/17/2016 83* 140 - 440 K/uL Final   ??? NEUTROPHILS 11/17/2016 75* 40 - 70 % Final   ??? MIXED CELLS 11/17/2016 10  0.1 - 17 % Final   ??? LYMPHOCYTES 11/17/2016 15  14 - 44 % Final   ??? ABS. NEUTROPHILS 11/17/2016 2.2  1.8 - 9.5 K/UL Final   ??? ABS. MIXED CELLS 11/17/2016 0.3  0.0 - 2.3 K/uL Final   ??? ABS. LYMPHOCYTES 11/17/2016 0.4* 1.1 - 5.9 K/UL Final    Test performed at [place] Location. Results Reviewed by Medical Director.    ??? DF 11/17/2016 AUTOMATED    Final   Hospital Outpatient Visit on 11/01/2016   Component Date Value Ref Range Status   ??? WBC 11/01/2016 3.3* 4.5 - 13.0 K/uL Final   ??? RBC 11/01/2016 3.34* 4.10 - 5.10 M/uL Final   ??? HGB 11/01/2016 9.5* 12.0 - 16.0 g/dL Final   ??? HCT 11/01/2016 30.1* 36 - 48 % Final   ??? MCV 11/01/2016 90.1  78 - 102 FL Final   ??? MCH 11/01/2016 28.4  25.0 - 35.0 PG Final   ??? MCHC 11/01/2016 31.6  31 - 37 g/dL Final   ??? RDW 11/01/2016 15.5* 11.5 - 14.5 % Final   ??? PLATELET 11/01/2016 86* 140 - 440 K/uL Final   ??? NEUTROPHILS 11/01/2016 74* 40 - 70 % Final   ??? MIXED CELLS 11/01/2016 10  0.1 - 17 % Final   ??? LYMPHOCYTES 11/01/2016 16  14 - 44 % Final   ??? ABS. NEUTROPHILS 11/01/2016 2.5  1.8 - 9.5 K/UL Final   ??? ABS. MIXED CELLS 11/01/2016 0.3  0.0 - 2.3 K/uL Final   ??? ABS. LYMPHOCYTES 11/01/2016 0.5* 1.1 - 5.9 K/UL Final    Test performed at [place] Location. Results Reviewed by Medical Director.   ??? DF 11/01/2016 AUTOMATED    Final   Hospital Outpatient Visit on 10/12/2016   Component Date Value Ref Range Status   ??? Occult blood fecal, by [place] 10/12/2016 Positive* NEGATIVE   Final    Comment: (NOTE)  Performed At: [facility]  [address] [place], [place] [id number]  [place]:[id number]     Hospital Outpatient Visit on 10/07/2016   Component Date Value Ref Range Status   ??? WBC 10/07/2016 3.3* 4.5 - 13.0 K/uL Final   ??? RBC 10/07/2016 3.50* 4.10 - 5.10 M/uL Final   ??? HGB 10/07/2016 9.9* 12.0 - 16.0 g/dL Final   ??? HCT 10/07/2016 32.8* 36 - 48 % Final   ??? MCV 10/07/2016 93.7  78 - 102 FL Final   ??? [place]  10/07/2016 28.3  25.0 - 35.0 PG Final   ??? MCHC 10/07/2016 30.2* 31 - 37 g/dL Final   ??? RDW 10/07/2016 18.2* 11.5 - 14.5 % Final   ??? PLATELET 10/07/2016 90* 140 - 440 K/uL Final   ??? NEUTROPHILS 10/07/2016 78* 40 - 70 % Final   ??? MIXED CELLS 10/07/2016 8  0.1 - 17 % Final   ??? LYMPHOCYTES 10/07/2016 14  14 - 44 % Final    ??? ABS. NEUTROPHILS 10/07/2016 2.5  1.8 - 9.5 K/UL Final   ??? ABS. MIXED CELLS 10/07/2016 0.3  0.0 - 2.3 K/uL Final   ??? ABS. LYMPHOCYTES 10/07/2016 0.5* 1.1 - 5.9 K/UL Final    Test performed at [place] Location. Results Reviewed by Medical Director.   ??? DF 10/07/2016 AUTOMATED    Final          Follow-up Disposition:  Return in about 2 months (around 03/03/2017).

## 2016-12-31 NOTE — Telephone Encounter (Addendum)
Received verbal order from Dr Phineas Douglas to [place] for the pt. Spoke to [place], Software engineer

## 2017-01-01 MED ORDER — NALOXONE 4 MG/ACTUATION NASAL SPRAY
4 mg/actuation | NASAL | 0 refills | Status: AC
Start: 2017-01-01 — End: ?

## 2017-01-01 NOTE — Progress Notes (Signed)
Reviewed and discussed with patient during the visit

## 2017-01-02 LAB — CULTURE, URINE: Culture result:: NO GROWTH

## 2017-01-03 MED ORDER — SODIUM CHLORIDE 0.9 % IV
1005 mg iron/5 mL | Freq: Once | INTRAVENOUS | Status: AC
Start: 2017-01-03 — End: 2017-01-10
  Administered 2017-01-10: 17:00:00 via INTRAVENOUS

## 2017-01-03 MED FILL — VENOFER 100 MG IRON/5 ML INTRAVENOUS SOLUTION: 100 mg iron/5 mL | INTRAVENOUS | Qty: 12.5

## 2017-01-03 NOTE — Progress Notes (Signed)
Urine culture is negative

## 2017-01-04 ENCOUNTER — Institutional Professional Consult (permissible substitution): Admit: 2017-01-04 | Discharge: 2017-01-04 | Payer: PRIVATE HEALTH INSURANCE | Primary: Legal Medicine

## 2017-01-04 ENCOUNTER — Inpatient Hospital Stay: Admit: 2017-01-04 | Primary: Legal Medicine

## 2017-01-04 ENCOUNTER — Inpatient Hospital Stay: Admit: 2017-01-04 | Payer: MEDICARE | Primary: Legal Medicine

## 2017-01-04 DIAGNOSIS — D696 Thrombocytopenia, unspecified: Secondary | ICD-10-CM

## 2017-01-04 LAB — CBC WITH 3 PART DIFF
ABS. LYMPHOCYTES: 0.6 10*3/uL — ABNORMAL LOW (ref 1.1–5.9)
ABS. MIXED CELLS: 0.5 10*3/uL (ref 0.0–2.3)
ABS. NEUTROPHILS: 3.5 10*3/uL (ref 1.8–9.5)
HCT: 30 % — ABNORMAL LOW (ref 36–48)
HGB: 8.4 g/dL — ABNORMAL LOW (ref 12.0–16.0)
LYMPHOCYTES: 12 % — ABNORMAL LOW (ref 14–44)
MCH: 23.1 PG — ABNORMAL LOW (ref 25.0–35.0)
MCHC: 28 g/dL — ABNORMAL LOW (ref 31–37)
MCV: 82.6 FL (ref 78–102)
Mixed cells: 12 % (ref 0.1–17)
NEUTROPHILS: 76 % — ABNORMAL HIGH (ref 40–70)
PLATELET: 75 10*3/uL — ABNORMAL LOW (ref 140–440)
RBC: 3.63 M/uL — ABNORMAL LOW (ref 4.10–5.10)
RDW: 19 % — ABNORMAL HIGH (ref 11.5–14.5)
WBC: 4.6 10*3/uL (ref 4.5–13.0)

## 2017-01-04 MED ORDER — EPOETIN ALFA 20,000 UNIT/ML IJ SOLN
20000 unit/mL | Freq: Once | INTRAMUSCULAR | Status: AC
Start: 2017-01-04 — End: 2017-01-04
  Administered 2017-01-04: 19:00:00 via SUBCUTANEOUS

## 2017-01-04 MED ORDER — EPOETIN ALFA 40,000 UNIT/ML IJ SOLN
40000 unit/mL | Freq: Once | INTRAMUSCULAR | Status: AC
Start: 2017-01-04 — End: 2017-01-04
  Administered 2017-01-04: 19:00:00 via SUBCUTANEOUS

## 2017-01-04 MED FILL — PROCRIT 20,000 UNIT/ML INJECTION SOLUTION: 20000 unit/mL | INTRAMUSCULAR | Qty: 1

## 2017-01-04 MED FILL — PROCRIT 40,000 UNIT/ML INJECTION SOLUTION: 40000 unit/mL | INTRAMUSCULAR | Qty: 1

## 2017-01-04 NOTE — Progress Notes (Signed)
[facility] OPIC Progress Note    Date: January 04, 2017    Name: William Huitron [facility] Sr.    MRN: [id number]         DOB: 05/25/1946      William Blanchard arrived to [facility] at 14 with wife at side. Denies any improvement from recent procrit injection, also denies any reactions or complications.  Pt states his back is causing him pain " 7 on a 10 point scale".      William Blanchard was assessed and education was provided.     William Blanchard vitals were reviewed.  Visit Vitals   ??? BP 104/63 (BP 1 Location: Left arm, BP Patient Position: Sitting)   ??? Pulse 73   ??? Temp 98.2 ??F (36.8 ??C)   ??? Resp 16   ??? SpO2 98%       Blood drawn for labs via Right antecubital right, condition patent and no redness venipuncture x1 attempt    Lab results were obtained and reviewed.     Recent Results (from the past 12 hour(s))   CBC WITH 3 PART DIFF    Collection Time: 01/04/17  2:22 PM   Result Value Ref Range    WBC 4.6 4.5 - 13.0 K/uL    RBC 3.63 (L) 4.10 - 5.10 M/uL    HGB 8.4 (L) 12.0 - 16.0 g/dL    HCT 30.0 (L) 36 - 48 %    MCV 82.6 78 - 102 FL    MCH 23.1 (L) 25.0 - 35.0 PG    MCHC 28.0 (L) 31 - 37 g/dL    RDW 19.0 (H) 11.5 - 14.5 %    PLATELET 75 (L) 140 - 440 K/uL    NEUTROPHILS 76 (H) 40 - 70 %    MIXED CELLS 12 0.1 - 17 %    LYMPHOCYTES 12 (L) 14 - 44 %    ABS. NEUTROPHILS 3.5 1.8 - 9.5 K/UL    ABS. MIXED CELLS 0.5 0.0 - 2.3 K/uL    ABS. LYMPHOCYTES 0.6 (L) 1.1 - 5.9 K/UL    DF AUTOMATED             60,000 units Procrit was administered as ordered SQ in patient's right  upper arm covered with a bandaid,per protocol    William Blanchard tolerated well without complaints.    William Blanchard was discharged from [place] in stable condition at 1450.  He is to return on 01/10/2017 at 1100 for his next appointment for [place], RN  January 04, 2017

## 2017-01-05 NOTE — Telephone Encounter (Signed)
Added Meds in the Medication Profile list:   Hydroxyzine HCL 10 MG   Betamethasone Diproprionate

## 2017-01-07 ENCOUNTER — Encounter: Attending: Cardiovascular Disease | Primary: Legal Medicine

## 2017-01-10 ENCOUNTER — Inpatient Hospital Stay: Admit: 2017-01-10 | Payer: MEDICARE | Primary: Legal Medicine

## 2017-01-10 MED ORDER — SODIUM CHLORIDE 0.9 % IJ SYRG
INTRAMUSCULAR | Status: DC | PRN
Start: 2017-01-10 — End: 2017-01-14
  Administered 2017-01-10 (×2): via INTRAVENOUS

## 2017-01-10 MED FILL — BD POSIFLUSH NORMAL SALINE 0.9 % INJECTION SYRINGE: INTRAMUSCULAR | Qty: 40

## 2017-01-10 NOTE — Progress Notes (Signed)
[facility] OPIC Progress Note    Date: January 10, 2017    Name: William Blanchard [facility] Sr.    MRN: [id number]         DOB: Nov 26, 1945      [name] arrived in the [facility] today at 1305, in stable condition, here for Dose # 2 of 4, IV Venofer Infusion. He was assessed and education was provided.     [name] vitals were reviewed.  Visit Vitals   ??? BP 98/55 (BP 1 Location: Left arm, BP Patient Position: At rest;Sitting)   ??? Pulse 76   ??? Temp 98.2 ??F (36.8 ??C)   ??? Resp 16         PIV was established in his right forearm at 1315, without incident.         Venofer 250 mg IV, was administered over approximately 90 minutes, per order, and without incident.       After completion of the IV Venofer Infusion, the PIV was flushed well with 20 ml NS, and then, the PIV was removed and gauze/bandaid was applied.       [name] politely declined to stay for the 30 minute post Venofer completion, observation period.              [name] tolerated well, and had no complaints.    [name] was discharged from [place] in stable condition at 1510. He is to return in 2 weeks, on Monday, 01-24-17, at 11, for his next appointment, for Dose # 3 of 4, IV Venofer Infusion. However, he is also scheduled to return on next Tuesday, 01-18-17 at 1100, for his next CBC & Procrit injection.     [name], RN  January 10, 2017  1:52 PM

## 2017-01-13 MED FILL — VENOFER 100 MG IRON/5 ML INTRAVENOUS SOLUTION: 100 mg iron/5 mL | INTRAVENOUS | Qty: 50

## 2017-01-15 ENCOUNTER — Other Ambulatory Visit: Payer: Self-pay | Admitting: Gastroenterology

## 2017-01-17 MED ORDER — SODIUM CHLORIDE 0.9 % IV
100 mg iron/5 mL | Freq: Once | INTRAVENOUS | Status: AC
Start: 2017-01-17 — End: 2017-01-24
  Administered 2017-01-24: 16:00:00 via INTRAVENOUS

## 2017-01-17 MED ORDER — IRON SUCROSE 100 MG/5 ML IV SOLN
1005 mg iron/5 mL | Freq: Once | INTRAVENOUS | Status: DC
Start: 2017-01-17 — End: 2017-01-17

## 2017-01-17 MED FILL — VENOFER 100 MG IRON/5 ML INTRAVENOUS SOLUTION: 100 mg iron/5 mL | INTRAVENOUS | Qty: 12.5

## 2017-01-18 ENCOUNTER — Institutional Professional Consult (permissible substitution): Admit: 2017-01-18 | Discharge: 2017-01-18 | Payer: PRIVATE HEALTH INSURANCE | Primary: Legal Medicine

## 2017-01-18 ENCOUNTER — Inpatient Hospital Stay: Admit: 2017-01-18 | Primary: Legal Medicine

## 2017-01-18 ENCOUNTER — Inpatient Hospital Stay: Admit: 2017-01-18 | Payer: MEDICARE | Primary: Legal Medicine

## 2017-01-18 DIAGNOSIS — D509 Iron deficiency anemia, unspecified: Secondary | ICD-10-CM

## 2017-01-18 LAB — CBC WITH 3 PART DIFF
ABS. LYMPHOCYTES: 0.6 10*3/uL — ABNORMAL LOW (ref 1.1–5.9)
ABS. MIXED CELLS: 0.5 10*3/uL (ref 0.0–2.3)
ABS. NEUTROPHILS: 2.8 10*3/uL (ref 1.8–9.5)
HCT: 32.2 % — ABNORMAL LOW (ref 36–48)
HGB: 9.2 g/dL — ABNORMAL LOW (ref 12.0–16.0)
LYMPHOCYTES: 14 % (ref 14–44)
MCH: 24.1 PG — ABNORMAL LOW (ref 25.0–35.0)
MCHC: 28.6 g/dL — ABNORMAL LOW (ref 31–37)
MCV: 84.3 FL (ref 78–102)
Mixed cells: 12 % (ref 0.1–17)
NEUTROPHILS: 74 % — ABNORMAL HIGH (ref 40–70)
PLATELET: 117 10*3/uL — ABNORMAL LOW (ref 140–440)
RBC: 3.82 M/uL — ABNORMAL LOW (ref 4.10–5.10)
RDW: 21 % — ABNORMAL HIGH (ref 11.5–14.5)
WBC: 3.9 10*3/uL — ABNORMAL LOW (ref 4.5–13.0)

## 2017-01-18 MED ORDER — EPOETIN ALFA 40,000 UNIT/ML IJ SOLN
40000 unit/mL | Freq: Once | INTRAMUSCULAR | Status: AC
Start: 2017-01-18 — End: 2017-01-18

## 2017-01-18 MED ORDER — EPOETIN ALFA 20,000 UNIT/ML IJ SOLN
20000 unit/mL | Freq: Once | INTRAMUSCULAR | Status: AC
Start: 2017-01-18 — End: 2017-01-18

## 2017-01-18 NOTE — Progress Notes (Signed)
[facility] OPIC Progress Note    Date: January 18, 2017    Name: William Fandino [facility] Sr.    MRN: [id number]         DOB: May 21, 1946      William Blanchard arrived to [facility] at 9 with wife at side. Patient stating that he feels like he has more energy.  Pt states his back is causing him pain "6 on a 10 point scale".      William Blanchard was assessed and education was provided.     William Blanchard vitals were reviewed.  Visit Vitals   ??? BP 108/62 (BP 1 Location: Left arm, BP Patient Position: At rest;Sitting)   ??? Pulse 66   ??? Temp 97.9 ??F (36.6 ??C)   ??? Resp 16   ??? SpO2 94%       Blood drawn for labs via Right antecubital right, condition patent and no redness venipuncture x1 attempt    Lab results were obtained and reviewed.     Recent Results (from the past 12 hour(s))   CBC WITH 3 PART DIFF    Collection Time: 01/18/17 11:18 AM   Result Value Ref Range    WBC 3.9 (L) 4.5 - 13.0 K/uL    RBC 3.82 (L) 4.10 - 5.10 M/uL    HGB 9.2 (L) 12.0 - 16.0 g/dL    HCT 32.2 (L) 36 - 48 %    MCV 84.3 78 - 102 FL    MCH 24.1 (L) 25.0 - 35.0 PG    MCHC 28.6 (L) 31 - 37 g/dL    RDW 21.0 (H) 11.5 - 14.5 %    PLATELET 117 (L) 140 - 440 K/uL    NEUTROPHILS 74 (H) 40 - 70 %    MIXED CELLS 12 0.1 - 17 %    LYMPHOCYTES 14 14 - 44 %    ABS. NEUTROPHILS 2.8 1.8 - 9.5 K/UL    ABS. MIXED CELLS 0.5 0.0 - 2.3 K/uL    ABS. LYMPHOCYTES 0.6 (L) 1.1 - 5.9 K/UL    DF AUTOMATED             Procrit was held today per order parameter.    [name] tolerated well without complaints.    William Blanchard was discharged from [place] in stable condition at 1125.  He is to return on 01/24/2017 at 1100 for his next appointment for [place], RN  January 18, 2017  1303

## 2017-01-20 ENCOUNTER — Telehealth: Payer: Self-pay | Admitting: Gastroenterology

## 2017-01-20 ENCOUNTER — Other Ambulatory Visit: Payer: Self-pay | Admitting: Nurse Practitioner

## 2017-01-20 MED ORDER — SUCRALFATE 1 GRAM TAB
1 gram | ORAL_TABLET | ORAL | 0 refills | Status: DC
Start: 2017-01-20 — End: 2017-02-16

## 2017-01-20 NOTE — Telephone Encounter (Signed)
I called the number that was listed on the paper Rx that we received- [phone]- no answer, but I left a voicemail stating that we would not be able to do any further refills and that they needed to find a provider in their area. I am also mailing a letter to the address on the Rx. Brad Wall has marked the chart has discharged so no further refills will be done.

## 2017-01-20 NOTE — Telephone Encounter (Signed)
I have refilled his medications. Needs to have further refills through established GI care where he is now (he is in [place] I believe? )

## 2017-01-24 ENCOUNTER — Inpatient Hospital Stay: Admit: 2017-01-24 | Payer: MEDICARE | Primary: Legal Medicine

## 2017-01-24 MED ORDER — SODIUM CHLORIDE 0.9 % IV
INTRAVENOUS | Status: AC | PRN
Start: 2017-01-24 — End: 2017-01-25

## 2017-01-24 MED ORDER — SODIUM CHLORIDE 0.9 % IJ SYRG
INTRAMUSCULAR | Status: DC | PRN
Start: 2017-01-24 — End: 2017-01-28
  Administered 2017-01-24 (×2): via INTRAVENOUS

## 2017-01-24 MED FILL — BD POSIFLUSH NORMAL SALINE 0.9 % INJECTION SYRINGE: INTRAMUSCULAR | Qty: 40

## 2017-01-24 MED FILL — SODIUM CHLORIDE 0.9 % IV: INTRAVENOUS | Qty: 250

## 2017-01-24 NOTE — Progress Notes (Signed)
[facility] OPIC Progress Note    Date: January 24, 2017    Name: William Daughtridge [facility] Sr.    MRN: [id number]         DOB: 10/23/1945      [name] was assessed and education was provided.     [name] vitals were reviewed and patient was observed for 5 minutes prior to treatment.   Visit Vitals   ??? BP 102/65 (BP 1 Location: Right arm, BP Patient Position: At rest;Sitting)   ??? Pulse 71   ??? Temp 99.1 ??F (37.3 ??C)   ??? Resp 16       Dose #3 of 4 Venofer 250 mg IV given.  Mr William Blanchard politely declined staying for 30 min observation after infusion.    [name] tolerated the infusion, and had no complaints.  Patient armband removed and shredded.    [name] was discharged from [place] in stable condition at 1345. He is to return on 02/01/17 at 1400 for his next appointment for CBC/Procrit Q 2 weeks.    Lynnda Shields, RN  January 24, 2017

## 2017-01-25 NOTE — Telephone Encounter (Signed)
NEUROSURGERY has added to medication is this okay  1. Metaxalone 800 mg  2. nabumtone 500mg     Please call

## 2017-01-25 NOTE — Telephone Encounter (Signed)
Dr. Ellison Hughs,     Please review the medications added by the neurologist.

## 2017-01-26 NOTE — Telephone Encounter (Signed)
ok 

## 2017-01-31 ENCOUNTER — Encounter: Attending: Urology | Primary: Legal Medicine

## 2017-01-31 MED ORDER — IRON SUCROSE 100 MG/5 ML IV SOLN
100 mg iron/5 mL | Freq: Once | INTRAVENOUS | Status: AC
Start: 2017-01-31 — End: 2017-02-07
  Administered 2017-02-07: 16:00:00 via INTRAVENOUS

## 2017-01-31 MED FILL — VENOFER 100 MG IRON/5 ML INTRAVENOUS SOLUTION: 100 mg iron/5 mL | INTRAVENOUS | Qty: 12.5

## 2017-01-31 NOTE — Telephone Encounter (Signed)
Pts wife calling to see if her husband can take Nabumetone 500 mg & Metaxalone 800 mg w/ his liver condition. These meds were prescribed by his neurologist on 01/20/17.

## 2017-01-31 NOTE — Telephone Encounter (Signed)
Please see message below. Spoke with Patients wife who stated that the Neurologist wanted to verify with her if it was okay for the patient to take these medications. Notified the patients wife that Dr. Phineas Douglas was out of the office today and would return tomorrow. Verbal understanding given. Thank you

## 2017-02-01 ENCOUNTER — Inpatient Hospital Stay: Payer: MEDICARE | Primary: Legal Medicine

## 2017-02-01 ENCOUNTER — Encounter: Primary: Legal Medicine

## 2017-02-01 DIAGNOSIS — D469 Myelodysplastic syndrome, unspecified: Secondary | ICD-10-CM

## 2017-02-01 NOTE — Telephone Encounter (Signed)
As far as Nabumetone 500 there is no interaction with liver function, but still he need to be watched closely and his liver function need to be checked while taking this medication, also he should consult also his gastro enterology and hepatologist.    The second medication metaxalone I do not really recommend this medication because he is taking other medications for pain and depression that affect his central nervous system and this medication will affect the central nervous system even more and will make the patient more drowsy and lethargic, and also it might have a negative impact on his liver function.

## 2017-02-02 ENCOUNTER — Inpatient Hospital Stay: Admit: 2017-02-02 | Payer: MEDICARE | Primary: Legal Medicine

## 2017-02-02 ENCOUNTER — Institutional Professional Consult (permissible substitution): Admit: 2017-02-02 | Discharge: 2017-02-02 | Payer: PRIVATE HEALTH INSURANCE | Primary: Legal Medicine

## 2017-02-02 ENCOUNTER — Inpatient Hospital Stay: Admit: 2017-02-02 | Primary: Legal Medicine

## 2017-02-02 DIAGNOSIS — D508 Other iron deficiency anemias: Secondary | ICD-10-CM

## 2017-02-02 LAB — CBC WITH 3 PART DIFF
ABS. LYMPHOCYTES: 0.9 10*3/uL — ABNORMAL LOW (ref 1.1–5.9)
ABS. MIXED CELLS: 0.5 10*3/uL (ref 0.0–2.3)
ABS. NEUTROPHILS: 3.6 10*3/uL (ref 1.8–9.5)
HCT: 32.2 % — ABNORMAL LOW (ref 36–48)
HGB: 9.2 g/dL — ABNORMAL LOW (ref 12.0–16.0)
LYMPHOCYTES: 18 % (ref 14–44)
MCH: 24.8 PG — ABNORMAL LOW (ref 25.0–35.0)
MCHC: 28.6 g/dL — ABNORMAL LOW (ref 31–37)
MCV: 86.8 FL (ref 78–102)
Mixed cells: 10 % (ref 0.1–17)
NEUTROPHILS: 72 % — ABNORMAL HIGH (ref 40–70)
PLATELET: 82 10*3/uL — ABNORMAL LOW (ref 140–440)
RBC: 3.71 M/uL — ABNORMAL LOW (ref 4.10–5.10)
RDW: 22.7 % — ABNORMAL HIGH (ref 11.5–14.5)
WBC: 5 10*3/uL (ref 4.5–13.0)

## 2017-02-02 MED ORDER — EPOETIN ALFA 40,000 UNIT/ML IJ SOLN
40000 unit/mL | Freq: Once | INTRAMUSCULAR | Status: AC
Start: 2017-02-02 — End: 2017-02-03

## 2017-02-02 MED ORDER — EPOETIN ALFA 20,000 UNIT/ML IJ SOLN
20000 unit/mL | Freq: Once | INTRAMUSCULAR | Status: AC
Start: 2017-02-02 — End: 2017-02-03

## 2017-02-02 NOTE — Telephone Encounter (Signed)
Patient wife has been called and notified of message below. Verbal understanding given. Closing encounter.

## 2017-02-02 NOTE — Progress Notes (Signed)
[facility] OPIC Progress Note    Date: February 02, 2017    Name: Jeffory Snelgrove [facility] Sr.    MRN: [id number]         DOB: [birth year]      Mr. Strohecker arrived in the [facility] today at 1615, in stable condition, here for Q 2 Week, CBC/Procrit injection. He was assessed and education was provided.     Mr. Oriley vitals were reviewed.  Visit Vitals   ??? BP 98/56 (BP 1 Location: Right arm, BP Patient Position: At rest;Sitting)   ??? Pulse 70   ??? Temp 99.2 ??F (37.3 ??C)   ??? Resp 16         CBC was drawn from his right AC at 1626, per order, and without incident.           Lab results were obtained and reviewed.  Recent Results (from the past 12 hour(s))   CBC WITH 3 PART DIFF    Collection Time: 02/02/17  4:26 PM   Result Value Ref Range    WBC 5.0 4.5 - 13.0 K/uL    RBC 3.71 (L) 4.10 - 5.10 M/uL    HGB 9.2 (L) 12.0 - 16.0 g/dL    HCT 32.2 (L) 36 - 48 %    MCV 86.8 78 - 102 FL    MCH 24.8 (L) 25.0 - 35.0 PG    MCHC 28.6 (L) 31 - 37 g/dL    RDW 22.7 (H) 11.5 - 14.5 %    PLATELET 82 (L) 140 - 440 K/uL    NEUTROPHILS 72 (H) 40 - 70 %    MIXED CELLS 10 0.1 - 17 %    LYMPHOCYTES 18 14 - 44 %    ABS. NEUTROPHILS 3.6 1.8 - 9.5 K/UL    ABS. MIXED CELLS 0.5 0.0 - 2.3 K/uL    ABS. LYMPHOCYTES 0.9 (L) 1.1 - 5.9 K/UL    DF AUTOMATED             Procrit injection was HELD today, per order, for HCT 32.2.         Mr. Gharibian tolerated well, and had no complaints.    Mr. Thrun was discharged from [place] in stable condition at 1645. He is to return on Monday, 02-07-17,  at 1100,  for his next appointment, for Dose # 4 of 4, IV Venofer Infusion. Then, he is scheduled to return in 2 weeks, on Tuesday, 02-15-17, at 1030, for his next CBC/Procrit injection. [name], RN  February 02, 2017  4:41 PM

## 2017-02-03 MED FILL — VENOFER 100 MG IRON/5 ML INTRAVENOUS SOLUTION: 100 mg iron/5 mL | INTRAVENOUS | Qty: 50

## 2017-02-07 ENCOUNTER — Inpatient Hospital Stay: Admit: 2017-02-07 | Payer: MEDICARE | Primary: Legal Medicine

## 2017-02-07 MED ORDER — SODIUM CHLORIDE 0.9 % IJ SYRG
INTRAMUSCULAR | Status: DC | PRN
Start: 2017-02-07 — End: 2017-02-10
  Administered 2017-02-07: 14:00:00 via INTRAVENOUS

## 2017-02-07 MED FILL — BD POSIFLUSH NORMAL SALINE 0.9 % INJECTION SYRINGE: INTRAMUSCULAR | Qty: 40

## 2017-02-07 NOTE — Progress Notes (Signed)
[facility] OPIC Progress Note    Date: February 07, 2017    Name: William Biel [facility] Sr.    MRN: [id number]         DOB: [birth year]      [name] was assessed and education was provided.     [name] vitals were reviewed and patient was observed for 5 minutes prior to treatment.   Visit Vitals   ??? BP 104/60 (BP 1 Location: Right arm, BP Patient Position: At rest;Sitting)   ??? Pulse 64   ??? Temp 98.4 ??F (36.9 ??C)   ??? Resp 16       Dose #4 of 4 Venofer 250 mg IV given via peripheral line after brisk blood return obtained.    Mr William Blanchard stayed for 30 min observation after infusion.    [name] tolerated the infusion, and had no complaints.  Patient armband removed and shredded.    [name] was discharged from [place] in stable condition at 1340. He is to return on 02/15/17 at 1030 for his next appointment for CBC/Procrit Q 2 wks.    Lynnda Shields, RN  February 07, 2017

## 2017-02-09 NOTE — Progress Notes (Signed)
NN health screening:    Informed Ms Habenicht that the low iron levels he is experiencing could be coming from his colon and it would be of great benefit to complete the colonoscopy as soon as possible. She thanked me for the information and I've given her Dr. Marguerita Beards phone number to call and schedule an appointment. Mrs Vertz did say "we probably won't make that appointment right now because he is now going through a skin breakout with so much itching I'm not sure he would lie still for a colonoscopy right now." Will continue to follow.

## 2017-02-10 MED FILL — VENOFER 100 MG IRON/5 ML INTRAVENOUS SOLUTION: 100 mg iron/5 mL | INTRAVENOUS | Qty: 50

## 2017-02-12 ENCOUNTER — Encounter

## 2017-02-14 ENCOUNTER — Ambulatory Visit
Admit: 2017-02-14 | Discharge: 2017-02-14 | Payer: PRIVATE HEALTH INSURANCE | Attending: Legal Medicine | Primary: Legal Medicine

## 2017-02-14 DIAGNOSIS — R1013 Epigastric pain: Secondary | ICD-10-CM

## 2017-02-14 MED ORDER — PROCHLORPERAZINE MALEATE 10 MG TAB
10 mg | ORAL_TABLET | Freq: Four times a day (QID) | ORAL | 5 refills | Status: DC | PRN
Start: 2017-02-14 — End: 2018-03-27

## 2017-02-14 MED ORDER — GEMFIBROZIL 600 MG TAB
600 mg | ORAL_TABLET | ORAL | 3 refills | Status: DC
Start: 2017-02-14 — End: 2018-03-27

## 2017-02-14 MED ORDER — LORAZEPAM 1 MG TAB
1 mg | ORAL_TABLET | Freq: Three times a day (TID) | ORAL | 0 refills | Status: DC | PRN
Start: 2017-02-14 — End: 2017-03-31

## 2017-02-14 MED ORDER — METFORMIN 500 MG TAB
500 mg | ORAL_TABLET | ORAL | 0 refills | Status: DC
Start: 2017-02-14 — End: 2017-05-16

## 2017-02-14 MED ORDER — KETOCONAZOLE 2 % TOPICAL CREAM
2 % | Freq: Every day | CUTANEOUS | 0 refills | Status: DC
Start: 2017-02-14 — End: 2018-05-16

## 2017-02-14 NOTE — Progress Notes (Addendum)
William Blanchard.     Chief Complaint   Patient presents with   ??? Foot Pain   ??? 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      Patient has anxiety he is on Ativan as needed and he is here for refill.        Patient is going to come concerned about bilateral skin dryness and rash in his foot has been there for a long time, is itchy is not painful and there is no open wounds, he tried moisturizer lotions but there is no much improvement, rashes in the top of the foot is erythematous and also in between his toes.    Past Medical History:   Diagnosis Date   ??? Anemia    ??? Anxiety    ??? Chronic pain    ??? Cirrhosis of liver ([place])    ??? Diabetes ([place])    ??? GERD (gastroesophageal reflux disease)    ??? Hyperlipemia    ??? Hypertension    ??? Hypotension    ??? Migraine    ??? Other cirrhosis of liver ([place])    ??? Stroke [facility])     had 3 strokes    ??? Thrombocytopenia ([place])      Past Surgical History:   Procedure Laterality Date   ??? HX CHOLECYSTECTOMY     ??? HX ORTHOPAEDIC      R hand sx   ??? HX OTHER SURGICAL      Hand surgery- Nail gun went through finger     Social History   Substance Use Topics   ??? Smoking status: Former Smoker   ??? Smokeless tobacco: Never Used      Comment: quit years ago   ??? Alcohol use No       Family History   Problem Relation Age of Onset   ??? Alzheimer Mother    ??? Diabetes Mother     ??? Cancer Mother      colon cancer    ??? Hypertension Father    ??? Heart Disease Father    ??? Cancer Father      prostate, lung cancer    ??? Diabetes Sister    ??? Heart Disease Sister    ??? Diabetes Brother        Review of Systems   Constitutional: Negative for chills, fever, malaise/fatigue and weight loss.   HENT: Negative for congestion, ear discharge, ear pain, hearing loss, nosebleeds, sinus pain and sore throat.    Eyes: Negative for blurred vision, double vision and discharge.   Respiratory: Negative for cough, hemoptysis, sputum production, shortness of breath and wheezing.  Cardiovascular: Negative for chest pain, palpitations, claudication and leg swelling.   Gastrointestinal: Positive for abdominal pain and nausea. Negative for blood in stool, constipation, diarrhea, melena and vomiting.   Genitourinary: Negative for dysuria, flank pain, frequency, hematuria and urgency.   Musculoskeletal: Positive for back pain and joint pain. Negative for falls, myalgias and neck pain.   Skin: Negative for itching and rash.   Neurological: Positive for sensory change. Negative for dizziness, tingling, speech change, focal weakness, seizures, loss of consciousness, weakness and headaches.   Psychiatric/Behavioral: Negative for depression, hallucinations, substance abuse and suicidal ideas. The patient is nervous/anxious and has insomnia.        Physical Exam   Constitutional: He is oriented to person, place, and time. He appears well-developed and well-nourished. No distress.   HENT:   Head: Normocephalic and atraumatic.   Mouth/Throat: No oropharyngeal exudate.   Eyes: Conjunctivae are normal. Pupils are equal, round, and reactive to light. Right eye exhibits no discharge. Left eye exhibits no discharge. No scleral icterus.   Neck: Normal range of motion. Neck supple. No thyromegaly present.   Cardiovascular: Normal rate, regular rhythm and normal heart sounds.     Pulmonary/Chest: Effort normal and breath sounds normal. No respiratory distress. He has no rales.   Abdominal: Soft. Bowel sounds are normal. He exhibits no distension and no mass. There is no tenderness. There is no rebound.   Musculoskeletal: Normal range of motion. He exhibits no edema, tenderness or deformity.   Lymphadenopathy:     He has no cervical adenopathy.   Neurological: He is alert and oriented to person, place, and time. No cranial nerve deficit. Coordination normal.   Skin: Skin is warm and dry. Rash noted. He is not diaphoretic. There is erythema.   Patient has dry skin in both feet, but there is erythema on the top of the foot and in between his toes there is what looks like a tinea infection bilaterally   Psychiatric: He has a normal mood and affect. Judgment and thought content normal.        Assessment and plan     1. Anxiety    - LORazepam (ATIVAN) 1 mg tablet; Take 1 Tab by mouth every eight (8) hours as needed for Anxiety for up to 90 days. Max Daily Amount: 3 mg.  Dispense: 90 Tab; Refill: 0    2. Intractable migraine without aura and without status migrainosus    - prochlorperazine (COMPAZINE) 10 mg tablet; Take 0.5 Tabs by mouth every six (6) hours as needed.  Dispense: 30 Tab; Refill: 5    3. Hyperlipidemia, unspecified hyperlipidemia type    - gemfibrozil (LOPID) 600 mg tablet; TAKE 1 TABLET BY MOUTH TWICE DAILY  Dispense: 180 Tab; Refill: 3    4. Controlled type 2 diabetes mellitus without complication, without long-term current use of insulin (HCC)  Well-controlled hemoglobin A1c is 5.6 today    5. Essential hypertension  Well-controlled    6. Tinea pedis of both feet  Advised to keep feet clean and dry, and he was moisturizing lotion  - ketoconazole (NIZORAL) 2 % topical cream; Apply  to affected area daily.  Dispense: 15 g; Refill: 0      Patient has not seen podiatrist in [place] a couple of years, I recommended that he see a podiatrist but the wife said they cannot afford it at this  point because it seeing so many specialists and paying a lot of copayment.  We will reevaluate his feet  next visit  7. Epigastric pain  Patient has worsening epigastric pain and tenderness on palpation had a CT scan last year, we will repeat another CT scan today  - CT ABD W CONT; Future    8. Hepatic cirrhosis, unspecified hepatic cirrhosis type, unspecified whether ascites present ([place])    - CT ABD W CONT; Future    Current Outpatient Prescriptions   Medication Sig Dispense Refill   ??? metFORMIN (GLUCOPHAGE) 500 mg tablet TAKE 2 TABLETS BY MOUTH TWICE DAILY WITH MEALS(GENERIC FOR GLUCOPHAGE) 360 Tab 0   ??? LORazepam (ATIVAN) 1 mg tablet Take 1 Tab by mouth every eight (8) hours as needed for Anxiety for up to 90 days. Max Daily Amount: 3 mg. 90 Tab 0   ??? prochlorperazine (COMPAZINE) 10 mg tablet Take 0.5 Tabs by mouth every six (6) hours as needed. 30 Tab 5   ??? gemfibrozil (LOPID) 600 mg tablet TAKE 1 TABLET BY MOUTH TWICE DAILY 180 Tab 3   ??? ketoconazole (NIZORAL) 2 % topical cream Apply  to affected area daily. 15 g 0   ??? doxepin (SINEQUAN) 25 mg capsule Take  by mouth nightly.     ??? L-Methylfolate-B12-Acetylcyst (CEREFOLIN) tablet Take 1 Tab by mouth daily.     ??? metaxalone (SKELAXIN) 800 mg tablet Take 800 mg by mouth three (3) times daily as needed.     ??? sucralfate (CARAFATE) 1 gram tablet TAKE 1 TABLET BY MOUTH FOUR TIMES DAILY WITH MEALS AND AT BEDTIME 120 Tab 0   ??? naloxone (NARCAN) 4 mg/actuation nasal spray Use 1 spray intranasally into 1 nostril. Use a new Narcan nasal spray for subsequent doses and administer into alternating nostrils. May repeat every 2 to 3 minutes as needed for opioid overdose symptoms. 1 Each 0   ??? butalbital-acetaminophen-caffeine (FIORICET, ESGIC) 50-325-40 mg per tablet Take 1 Tab by mouth every six (6) hours as needed. 30 Tab 0   ??? finasteride (PROSCAR) 5 mg tablet Take 1 Tab by mouth daily. 30 Tab 2   ??? oxyCODONE IR (ROXICODONE) 20 mg immediate release tablet TK 1 T PO Q 6  HOURS  0   ??? pantoprazole (PROTONIX) 40 mg tablet TAKE 1 TABLET BY MOUTH DAILY 30 Tab 0   ??? aspirin 81 mg chewable tablet Take 81 mg by mouth daily.     ??? MULTIVIT-MIN/FA/LYCOPEN/LUTEIN (CENTRUM SILVER MEN PO) Take  by mouth.     ??? cholecalciferol, vitamin D3, (VITAMIN D3) 2,000 unit tab Take  by mouth.     ??? B.infantis-B.ani-B.long-B.bifi (PROBIOTIC 4X) 10-15 mg TbEC Take  by mouth.     ??? topiramate (TOPAMAX) 100 mg tablet Take 1 Tab by mouth daily. 90 Tab 3   ??? propranolol (INDERAL) 20 mg tablet Take  by mouth three (3) times daily.     ??? lactulose (CHRONULAC) 10 gram/15 mL solution Take  by mouth three (3) times daily.     ??? cyclobenzaprine (FLEXERIL) 10 mg tablet Take  by mouth three (3) times daily as needed for Muscle Spasm(s).     ??? nabumetone (RELAFEN) 500 mg tablet Take 500 mg by mouth two (2) times daily (with meals).     ??? hydrOXYzine HCl (ATARAX) 10 mg tablet Take 10 mg by mouth nightly.     ??? augmented betamethasone dipropionate (DIPROLENE-AF) 0.05 % ointment Apply  to affected area two (2) times a day.     ??? HYDRAZINE SULFATE, BULK, by Does Not Apply route.     ???  tamsulosin (FLOMAX) 0.4 mg capsule TAKE 1 CAPSULE BY MOUTH EVERY DAY 90 Cap 0   ??? citalopram (CELEXA) 40 mg tablet Take 1 Tab by mouth daily. 90 Tab 3       Patient Active Problem List    Diagnosis Date Noted   ??? 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 Dermatitis 01/05/2017   ??? Benign prostatic hyperplasia (BPH) with straining on urination 12/31/2016   ??? 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 Myelodysplastic syndrome ([place]) 12/22/2016   ??? 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 Iron deficiency anemia secondary to inadequate dietary iron intake 09/23/2016   ??? Chronic leukopenia 09/23/2016   ??? Thrombocytopenia ([place]) 09/23/2016   ??? 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 Type II diabetes mellitus ([place]) 08/03/2016   ??? HTN (hypertension) 08/03/2016   ??? Mediterranean fever 08/03/2016    ??? Stroke ([place]) 08/03/2016   ??? Compressed vertebrae ([place]) 08/03/2016   ??? Chronic back pain greater than 3 months duration 08/03/2016   ??? Hyperlipemia 08/03/2016   ??? Anxiety 08/03/2016   ??? Depression 06/30/2016   ??? Iron deficiency anemia 04/22/2016   ??? NAFLD (nonalcoholic fatty liver disease) 04/22/2016   ??? Other cirrhosis of liver ([place]) 04/22/2016   ??? Chronic anemia 03/18/2016   ??? Controlled type 2 diabetes mellitus without complication, without long-term current use of insulin ([place]) 03/18/2016   ??? Essential hypertension 03/18/2016   ??? Hyperlipidemia 03/18/2016   ??? History of stroke 03/18/2016   ??? Anxiety 03/18/2016   ??? Chronic pain 03/18/2016     Results for orders placed or performed during the hospital encounter of 02/02/17   CBC WITH 3 PART DIFF   Result Value Ref Range    WBC 5.0 4.5 - 13.0 K/uL    RBC 3.71 (L) 4.10 - 5.10 M/uL    HGB 9.2 (L) 12.0 - 16.0 g/dL    HCT 32.2 (L) 36 - 48 %    MCV 86.8 78 - 102 FL    MCH 24.8 (L) 25.0 - 35.0 PG    MCHC 28.6 (L) 31 - 37 g/dL    RDW 22.7 (H) 11.5 - 14.5 %    PLATELET 82 (L) 140 - 440 K/uL    NEUTROPHILS 72 (H) 40 - 70 %    MIXED CELLS 10 0.1 - 17 %    LYMPHOCYTES 18 14 - 44 %    ABS. NEUTROPHILS 3.6 1.8 - 9.5 K/UL    ABS. MIXED CELLS 0.5 0.0 - 2.3 K/uL    ABS. LYMPHOCYTES 0.9 (L) 1.1 - 5.9 K/UL    [facility] AUTOMATED       Hospital Outpatient Visit on 02/02/2017   Component Date Value Ref Range Status   ??? WBC 02/02/2017 5.0  4.5 - 13.0 K/uL Final   ??? RBC 02/02/2017 3.71* 4.10 - 5.10 M/uL Final   ??? HGB 02/02/2017 9.2* 12.0 - 16.0 g/dL Final   ??? HCT 02/02/2017 32.2* 36 - 48 % Final   ??? MCV 02/02/2017 86.8  78 - 102 FL Final   ??? MCH 02/02/2017 24.8* 25.0 - 35.0 PG Final   ??? MCHC 02/02/2017 28.6* 31 - 37 g/dL Final   ??? RDW 02/02/2017 22.7* 11.5 - 14.5 % Final   ??? PLATELET 02/02/2017 82* 140 - 440 K/uL Final   ??? NEUTROPHILS 02/02/2017 72* 40 - 70 % Final   ??? MIXED CELLS 02/02/2017 10  0.1 - 17 % Final   ???  LYMPHOCYTES 02/02/2017 18  14 - 44 % Final    ??? ABS. NEUTROPHILS 02/02/2017 3.6  1.8 - 9.5 K/UL Final   ??? ABS. MIXED CELLS 02/02/2017 0.5  0.0 - 2.3 K/uL Final   ??? ABS. LYMPHOCYTES 02/02/2017 0.9* 1.1 - 5.9 K/UL Final    Test performed at [place] Location. Results Reviewed by Medical Director.   ??? DF 02/02/2017 AUTOMATED    Final   Hospital Outpatient Visit on 01/18/2017   Component Date Value Ref Range Status   ??? WBC 01/18/2017 3.9* 4.5 - 13.0 K/uL Final   ??? RBC 01/18/2017 3.82* 4.10 - 5.10 M/uL Final   ??? HGB 01/18/2017 9.2* 12.0 - 16.0 g/dL Final   ??? HCT 01/18/2017 32.2* 36 - 48 % Final   ??? MCV 01/18/2017 84.3  78 - 102 FL Final   ??? MCH 01/18/2017 24.1* 25.0 - 35.0 PG Final   ??? MCHC 01/18/2017 28.6* 31 - 37 g/dL Final   ??? RDW 01/18/2017 21.0* 11.5 - 14.5 % Final   ??? PLATELET 01/18/2017 117* 140 - 440 K/uL Final   ??? NEUTROPHILS 01/18/2017 74* 40 - 70 % Final   ??? MIXED CELLS 01/18/2017 12  0.1 - 17 % Final   ??? LYMPHOCYTES 01/18/2017 14  14 - 44 % Final   ??? ABS. NEUTROPHILS 01/18/2017 2.8  1.8 - 9.5 K/UL Final   ??? ABS. MIXED CELLS 01/18/2017 0.5  0.0 - 2.3 K/uL Final   ??? ABS. LYMPHOCYTES 01/18/2017 0.6* 1.1 - 5.9 K/UL Final    Test performed at [place] Location. Results Reviewed by Medical Director.   ??? DF 01/18/2017 AUTOMATED    Final   Hospital Outpatient Visit on 01/04/2017   Component Date Value Ref Range Status   ??? WBC 01/04/2017 4.6  4.5 - 13.0 K/uL Final   ??? RBC 01/04/2017 3.63* 4.10 - 5.10 M/uL Final   ??? HGB 01/04/2017 8.4* 12.0 - 16.0 g/dL Final   ??? HCT 01/04/2017 30.0* 36 - 48 % Final   ??? MCV 01/04/2017 82.6  78 - 102 FL Final   ??? MCH 01/04/2017 23.1* 25.0 - 35.0 PG Final   ??? MCHC 01/04/2017 28.0* 31 - 37 g/dL Final   ??? RDW 01/04/2017 19.0* 11.5 - 14.5 % Final   ??? PLATELET 01/04/2017 75* 140 - 440 K/uL Final   ??? NEUTROPHILS 01/04/2017 76* 40 - 70 % Final   ??? MIXED CELLS 01/04/2017 12  0.1 - 17 % Final   ??? LYMPHOCYTES 01/04/2017 12* 14 - 44 % Final   ??? ABS. NEUTROPHILS 01/04/2017 3.5  1.8 - 9.5 K/UL Final    ??? ABS. MIXED CELLS 01/04/2017 0.5  0.0 - 2.3 K/uL Final   ??? ABS. LYMPHOCYTES 01/04/2017 0.6* 1.1 - 5.9 K/UL Final    Test performed at [facility]. Results Reviewed by Medical Director.   ??? DF 01/04/2017 AUTOMATED    Final   Hospital Outpatient Visit on 12/31/2016   Component Date Value Ref Range Status   ??? Special Requests: 12/31/2016 NO SPECIAL REQUESTS    Final   ??? Culture result: 12/31/2016 NO GROWTH 2 DAYS    Final   Office Visit on 12/31/2016   Component Date Value Ref Range Status   ??? Color (UA POC) 12/31/2016 Yellow   Final   ??? Clarity (UA POC) 12/31/2016 Clear   Final   ??? Glucose (UA POC) 12/31/2016 Negative  Negative Final   ??? Bilirubin (UA POC)  12/31/2016 Negative  Negative Final   ??? Ketones (UA POC) 12/31/2016 Negative  Negative Final   ??? Specific gravity (UA POC) 12/31/2016 1.015  1.001 - 1.035 Final   ??? Blood (UA POC) 12/31/2016 Trace  Negative Final   ??? pH (UA POC) 12/31/2016 7  4.6 - 8.0 Final   ??? Protein (UA POC) 12/31/2016 Negative  Negative Final   ??? Urobilinogen (UA POC) 12/31/2016 0.2 mg/dL  0.2 - 1 Final   ??? Nitrites (UA POC) 12/31/2016 Negative  Negative Final   ??? Leukocyte esterase (UA POC) 12/31/2016 Negative  Negative Final   Hospital Outpatient Visit on 12/22/2016   Component Date Value Ref Range Status   ??? Sodium 12/22/2016 137  136 - 145 mmol/L Final   ??? Potassium 12/22/2016 4.5  3.5 - 5.5 mmol/L Final   ??? Chloride 12/22/2016 105  100 - 108 mmol/L Final   ??? CO2 12/22/2016 21  21 - 32 mmol/L Final   ??? Anion gap 12/22/2016 11  3.0 - 18 mmol/L Final   ??? Glucose 12/22/2016 228* 74 - 99 mg/dL Final   ??? BUN 12/22/2016 20* 7.0 - 18 MG/DL Final   ??? Creatinine 12/22/2016 1.14  0.6 - 1.3 MG/DL Final   ??? BUN/Creatinine ratio 12/22/2016 18  12 - 20   Final   ??? GFR est AA 12/22/2016 >60  >60 ml/min/1.52m Final   ??? GFR est non-AA 12/22/2016 >60  >60 ml/min/1.717mFinal    Comment: (NOTE)  Estimated GFR is calculated using the Modification of Diet in Renal    Disease (MDRD) Study equation, reported for both African Americans   (GFRAA) and non-African Americans (GFRNA), and normalized to 1.7321m body surface area. The physician must decide which value applies to   the patient. The MDRD study equation should only be used in   individuals age 5 30 older. It has not been validated for the   following: pregnant women, patients with serious comorbid conditions,   or on certain medications, or persons with extremes of body size,   muscle mass, or nutritional status.     ??? Calcium 12/22/2016 8.7  8.5 - 10.1 MG/DL Final   ??? Bilirubin, total 12/22/2016 0.4  0.2 - 1.0 MG/DL Final   ??? ALT (SGPT) 12/22/2016 26  16 - 61 U/L Final   ??? AST (SGOT) 12/22/2016 18  15 - 37 U/L Final   ??? Alk. phosphatase 12/22/2016 146* 45 - 117 U/L Final   ??? Protein, total 12/22/2016 7.0  6.4 - 8.2 g/dL Final   ??? Albumin 12/22/2016 4.0  3.4 - 5.0 g/dL Final   ??? Globulin 12/22/2016 3.0  2.0 - 4.0 g/dL Final   ??? A-G Ratio 12/22/2016 1.3  0.8 - 1.7   Final   ??? Iron 12/22/2016 14* 50 - 175 ug/dL Final    Patients receiving metal-binding drugs (e.g. deferoxamine) may show spuriously depressed iron values, as chelated iron may not properly react in the iron assay.   ??? TIBC 12/22/2016 454* 250 - 450 ug/dL Final   ??? Iron % saturation 12/22/2016 3  % Final   ??? Ferritin 12/22/2016 5* 8 - 388 NG/ML Final   Hospital Outpatient Visit on 12/22/2016   Component Date Value Ref Range Status   ??? WBC 12/22/2016 4.2* 4.5 - 13.0 K/uL Final   ??? RBC 12/22/2016 3.39* 4.10 - 5.10 M/uL Final   ??? HGB 12/22/2016 7.8* 12.0 - 16.0 g/dL Final   ??? HCT 12/22/2016 27.9*  36 - 48 % Final   ??? MCV 12/22/2016 82.3  78 - 102 FL Final   ??? MCH 12/22/2016 23.0* 25.0 - 35.0 PG Final   ??? MCHC 12/22/2016 28.0* 31 - 37 g/dL Final   ??? RDW 12/22/2016 14.7* 11.5 - 14.5 % Final   ??? PLATELET 12/22/2016 85* 140 - 440 K/uL Final   ??? NEUTROPHILS 12/22/2016 85* 40 - 70 % Final   ??? MIXED CELLS 12/22/2016 4  0.1 - 17 % Final    ??? LYMPHOCYTES 12/22/2016 11* 14 - 44 % Final   ??? ABS. NEUTROPHILS 12/22/2016 3.5  1.8 - 9.5 K/UL Final   ??? ABS. MIXED CELLS 12/22/2016 0.2  0.0 - 2.3 K/uL Final   ??? ABS. LYMPHOCYTES 12/22/2016 0.5* 1.1 - 5.9 K/UL Final    Test performed at [place] Location. Results Reviewed by Medical Director.   ??? DF 12/22/2016 AUTOMATED    Final   Hospital Outpatient Visit on 12/21/2016   Component Date Value Ref Range Status   ??? WBC 12/21/2016 4.2* 4.5 - 13.0 K/uL Final   ??? RBC 12/21/2016 3.54* 4.10 - 5.10 M/uL Final   ??? HGB 12/21/2016 8.5* 12.0 - 16.0 g/dL Final   ??? HCT 12/21/2016 29.0* 36 - 48 % Final   ??? MCV 12/21/2016 81.9  78 - 102 FL Final   ??? MCH 12/21/2016 24.0* 25.0 - 35.0 PG Final   ??? MCHC 12/21/2016 29.3* 31 - 37 g/dL Final   ??? RDW 12/21/2016 14.7* 11.5 - 14.5 % Final   ??? PLATELET 12/21/2016 98* 140 - 440 K/uL Final   ??? NEUTROPHILS 12/21/2016 89* 40 - 70 % Final   ??? MIXED CELLS 12/21/2016 4  0.1 - 17 % Final   ??? LYMPHOCYTES 12/21/2016 8* 14 - 44 % Final   ??? ABS. NEUTROPHILS 12/21/2016 3.7  1.8 - 9.5 K/UL Final   ??? ABS. MIXED CELLS 12/21/2016 0.2  0.0 - 2.3 K/uL Final   ??? ABS. LYMPHOCYTES 12/21/2016 0.3* 1.1 - 5.9 K/UL Final    Test performed at [place] Location. Results Reviewed by Medical Director.   ??? DF 12/21/2016 AUTOMATED    Final   Hospital Outpatient Visit on 12/07/2016   Component Date Value Ref Range Status   ??? WBC 12/07/2016 3.8  K/uL Final   ??? RBC 12/07/2016 3.35  M/uL Final   ??? HGB 12/07/2016 8.4* 13.0 - 16.0 g/dL Final   ??? HCT 12/07/2016 28.4  % Final   ??? MCV 12/07/2016 84.8  78.0 - 98.0 FL Final   ??? MCH 12/07/2016 25.1  25.0 - 35.0 PG Final   ??? MCHC 12/07/2016 29.6* 31.0 - 37.0 g/dL Final   ??? RDW 12/07/2016 14.6* 11.5 - 14.5 % Final   ??? PLATELET 12/07/2016 95  K/uL Final   ??? NEUTROPHILS 12/07/2016 72  42 - 75 % Final   ??? MIXED CELLS 12/07/2016 12  3.2 - 16.9 % Final   ??? LYMPHOCYTES 12/07/2016 16  12 - 49 % Final   ??? ABS. NEUTROPHILS 12/07/2016 2.8  K/UL Final    ??? ABS. MIXED CELLS 12/07/2016 0.4  0.2 - 1.2 K/uL Final   ??? ABS. LYMPHOCYTES 12/07/2016 0.6  K/UL Final   ??? DF 12/07/2016 AUTOMATED    Final   Hospital Outpatient Visit on 11/17/2016   Component Date Value Ref Range Status   ??? WBC 11/17/2016 2.9* 4.5 - 13.0 K/uL Final   ??? RBC 11/17/2016 3.33* 4.10 - 5.10  M/uL Final   ??? HGB 11/17/2016 8.6* 12.0 - 16.0 g/dL Final   ??? HCT 11/17/2016 29.2* 36 - 48 % Final   ??? MCV 11/17/2016 87.7  78 - 102 FL Final   ??? MCH 11/17/2016 25.8  25.0 - 35.0 PG Final   ??? MCHC 11/17/2016 29.5* 31 - 37 g/dL Final   ??? RDW 11/17/2016 15.3* 11.5 - 14.5 % Final   ??? PLATELET 11/17/2016 83* 140 - 440 K/uL Final   ??? NEUTROPHILS 11/17/2016 75* 40 - 70 % Final   ??? MIXED CELLS 11/17/2016 10  0.1 - 17 % Final   ??? LYMPHOCYTES 11/17/2016 15  14 - 44 % Final   ??? ABS. NEUTROPHILS 11/17/2016 2.2  1.8 - 9.5 K/UL Final   ??? ABS. MIXED CELLS 11/17/2016 0.3  0.0 - 2.3 K/uL Final   ??? ABS. LYMPHOCYTES 11/17/2016 0.4* 1.1 - 5.9 K/UL Final    Test performed at [place] Location. Results Reviewed by Medical Director.   ??? DF 11/17/2016 AUTOMATED    Final   There may be more visits with results that are not included.       Follow-up Disposition:  Return if symptoms worsen or fail to improve.

## 2017-02-15 ENCOUNTER — Inpatient Hospital Stay: Payer: MEDICARE | Primary: Legal Medicine

## 2017-02-15 NOTE — Telephone Encounter (Signed)
Noted, thanks for input.

## 2017-02-16 ENCOUNTER — Other Ambulatory Visit: Payer: Self-pay | Admitting: Gastroenterology

## 2017-02-16 NOTE — Telephone Encounter (Signed)
A letter was mailed to the pt in July, stating we would not be refilling any more rx.

## 2017-02-16 NOTE — Telephone Encounter (Signed)
Will need further refills from new GI in [place].  Cc CM as FYI

## 2017-02-17 ENCOUNTER — Inpatient Hospital Stay: Admit: 2017-02-17 | Primary: Legal Medicine

## 2017-02-17 ENCOUNTER — Inpatient Hospital Stay: Admit: 2017-02-17 | Payer: MEDICARE | Primary: Legal Medicine

## 2017-02-17 ENCOUNTER — Institutional Professional Consult (permissible substitution): Admit: 2017-02-17 | Discharge: 2017-02-18 | Payer: PRIVATE HEALTH INSURANCE | Primary: Legal Medicine

## 2017-02-17 DIAGNOSIS — D649 Anemia, unspecified: Secondary | ICD-10-CM

## 2017-02-17 LAB — CBC WITH 3 PART DIFF
ABS. LYMPHOCYTES: 0.5 10*3/uL — ABNORMAL LOW (ref 1.1–5.9)
ABS. MIXED CELLS: 0.4 10*3/uL (ref 0.0–2.3)
ABS. NEUTROPHILS: 2.7 10*3/uL (ref 1.8–9.5)
HCT: 34.4 % — ABNORMAL LOW (ref 36–48)
HGB: 10.2 g/dL — ABNORMAL LOW (ref 12.0–16.0)
LYMPHOCYTES: 14 % (ref 14–44)
MCH: 26.7 PG (ref 25.0–35.0)
MCHC: 29.7 g/dL — ABNORMAL LOW (ref 31–37)
MCV: 90.1 FL (ref 78–102)
Mixed cells: 10 % (ref 0.1–17)
NEUTROPHILS: 76 % — ABNORMAL HIGH (ref 40–70)
PLATELET: 96 10*3/uL — ABNORMAL LOW (ref 140–440)
RBC: 3.82 M/uL — ABNORMAL LOW (ref 4.10–5.10)
RDW: 23.4 % — ABNORMAL HIGH (ref 11.5–14.5)
WBC: 3.6 10*3/uL — ABNORMAL LOW (ref 4.5–13.0)

## 2017-02-17 NOTE — Progress Notes (Signed)
[facility] Progress Note    Date: February 17, 2017    Name: William Ditommaso [facility]    MRN: [id number]         DOB: 1945-10-07      [name] was assessed and education was provided.     [name] vitals were reviewed and patient was observed for 5 minutes prior to treatment.   Visit Vitals   ??? BP 109/62 (BP 1 Location: Left arm, BP Patient Position: At rest;Sitting)   ??? Pulse 63   ??? Temp 98 ??F (36.7 ??C)   ??? Resp 16       Lab results were obtained and reviewed.  Recent Results (from the past 12 hour(s))   CBC WITH 3 PART DIFF    Collection Time: 02/17/17  4:33 PM   Result Value Ref Range    WBC 3.6 (L) 4.5 - 13.0 K/uL    RBC 3.82 (L) 4.10 - 5.10 M/uL    HGB 10.2 (L) 12.0 - 16.0 g/dL    HCT 34.4 (L) 36 - 48 %    MCV 90.1 78 - 102 FL    MCH 26.7 25.0 - 35.0 PG    MCHC 29.7 (L) 31 - 37 g/dL    RDW 23.4 (H) 11.5 - 14.5 %    PLATELET 96 (L) 140 - 440 K/uL    NEUTROPHILS 76 (H) 40 - 70 %    MIXED CELLS 10 0.1 - 17 %    LYMPHOCYTES 14 14 - 44 %    ABS. NEUTROPHILS 2.7 1.8 - 9.5 K/UL    ABS. MIXED CELLS 0.4 0.0 - 2.3 K/uL    ABS. LYMPHOCYTES 0.5 (L) 1.1 - 5.9 K/UL    DF AUTOMATED         Procrit not given for H&H 10.2/34.4.      Patient armband removed and shredded.    [name] was discharged from [name] in stable condition at 1705. He is to return on 03/08/17 at 1100 for his next appointment for CBC/Procrit. Next appt is pushed forward so they can come on Tuesdays.    Lynnda Shields, RN  February 17, 2017  5:49 PM

## 2017-02-18 MED ORDER — SUCRALFATE 1 GRAM TAB
1 gram | ORAL_TABLET | ORAL | 0 refills | Status: DC
Start: 2017-02-18 — End: 2017-03-18

## 2017-02-23 ENCOUNTER — Inpatient Hospital Stay: Admit: 2017-02-23 | Payer: MEDICARE | Primary: Legal Medicine

## 2017-02-23 ENCOUNTER — Ambulatory Visit
Admit: 2017-02-23 | Discharge: 2017-02-23 | Payer: PRIVATE HEALTH INSURANCE | Attending: Hematology & Oncology | Primary: Legal Medicine

## 2017-02-23 ENCOUNTER — Inpatient Hospital Stay: Admit: 2017-02-23 | Primary: Legal Medicine

## 2017-02-23 DIAGNOSIS — D508 Other iron deficiency anemias: Secondary | ICD-10-CM

## 2017-02-23 DIAGNOSIS — D464 Refractory anemia, unspecified: Secondary | ICD-10-CM

## 2017-02-23 LAB — CBC WITH 3 PART DIFF
ABS. LYMPHOCYTES: 0.5 10*3/uL — ABNORMAL LOW (ref 1.1–5.9)
ABS. MIXED CELLS: 0.2 10*3/uL (ref 0.0–2.3)
ABS. NEUTROPHILS: 2.6 10*3/uL (ref 1.8–9.5)
HCT: 34 % — ABNORMAL LOW (ref 36–48)
HGB: 10.3 g/dL — ABNORMAL LOW (ref 12.0–16.0)
LYMPHOCYTES: 14 % (ref 14–44)
MCH: 27.4 PG (ref 25.0–35.0)
MCHC: 30.3 g/dL — ABNORMAL LOW (ref 31–37)
MCV: 90.4 FL (ref 78–102)
Mixed cells: 7 % (ref 0.1–17)
NEUTROPHILS: 79 % — ABNORMAL HIGH (ref 40–70)
PLATELET: 92 10*3/uL — ABNORMAL LOW (ref 140–440)
RBC: 3.76 M/uL — ABNORMAL LOW (ref 4.10–5.10)
RDW: 22.1 % — ABNORMAL HIGH (ref 11.5–14.5)
WBC: 3.3 10*3/uL — ABNORMAL LOW (ref 4.5–13.0)

## 2017-02-23 NOTE — Progress Notes (Signed)
Hematology/Oncology  Progress Note    Name: William Fulghum Sr.  Date: 02/23/2017  DOB: 11-Dec-1945    PCP: [name], MD     William Blanchard is a 71 y.o.-year-old man who has myelodysplastic syndrome, chronic leukopenia, and severe anemia.  Current therapy: Procrit 60,000 units every 2 weeks when the hemoglobin is below 10 g/dL.    Subjective:     William Blanchard is a 71 year old man who has myelodysplastic syndrome with severe anemia.  He also has chronic leukopenia and thrombocytopenia.  He is currently receiving Procrit 60,000 units every 2 weeks.  Today he is complaining of some fatigue and weakness.  His wife reports that although he is eating well he is continuing to lose weight and he is hypersomnolent, sleeping all of the time.    Past medical history, family history, and social history: these were reviewed and remains unchanged.    Past Medical History:   Diagnosis Date   ??? Anemia    ??? Anxiety    ??? Chronic pain    ??? Cirrhosis of liver ([place])    ??? Diabetes ([place])    ??? GERD (gastroesophageal reflux disease)    ??? Hyperlipemia    ??? Hypertension    ??? Hypotension    ??? Migraine    ??? Other cirrhosis of liver ([place])    ??? Stroke [facility])     had 3 strokes    ??? Thrombocytopenia ([place])      Past Surgical History:   Procedure Laterality Date   ??? HX CHOLECYSTECTOMY     ??? HX ORTHOPAEDIC      R hand sx   ??? HX OTHER SURGICAL      Hand surgery- Nail gun went through finger     Social History     Social History   ??? Marital status: MARRIED     Spouse name: N/A   ??? Number of children: N/A   ??? Years of education: N/A     Occupational History   ??? Not on file.     Social History Main Topics   ??? Smoking status: Former Smoker   ??? Smokeless tobacco: Never Used      Comment: quit years ago   ??? Alcohol use No   ??? Drug use: No   ??? Sexual activity: Yes     Partners: Female, Male     Other Topics Concern   ??? Not on file     Social History Narrative    ** Merged History Encounter **          Family History   Problem Relation Age of Onset    ??? Alzheimer Mother    ??? Diabetes Mother    ??? Cancer Mother      colon cancer    ??? Hypertension Father    ??? Heart Disease Father    ??? Cancer Father      prostate, lung cancer    ??? Diabetes Sister    ??? Heart Disease Sister    ??? Diabetes Brother      Current Outpatient Prescriptions   Medication Sig Dispense Refill   ??? sucralfate (CARAFATE) 1 gram tablet TAKE 1 TABLET BY MOUTH FOUR TIMES DAILY WITH MEALS AND AT BEDTIME 120 Tab 0   ??? metFORMIN (GLUCOPHAGE) 500 mg tablet TAKE 2 TABLETS BY MOUTH TWICE DAILY WITH MEALS(GENERIC FOR GLUCOPHAGE) 360 Tab 0   ??? LORazepam (ATIVAN) 1 mg tablet Take 1 Tab by mouth every eight (8)  hours as needed for Anxiety for up to 90 days. Max Daily Amount: 3 mg. 90 Tab 0   ??? prochlorperazine (COMPAZINE) 10 mg tablet Take 0.5 Tabs by mouth every six (6) hours as needed. 30 Tab 5   ??? gemfibrozil (LOPID) 600 mg tablet TAKE 1 TABLET BY MOUTH TWICE DAILY 180 Tab 3   ??? ketoconazole (NIZORAL) 2 % topical cream Apply  to affected area daily. 15 g 0   ??? doxepin (SINEQUAN) 25 mg capsule Take  by mouth nightly.     ??? L-Methylfolate-B12-Acetylcyst (CEREFOLIN) tablet Take 1 Tab by mouth daily.     ??? metaxalone (SKELAXIN) 800 mg tablet Take 800 mg by mouth three (3) times daily as needed.     ??? nabumetone (RELAFEN) 500 mg tablet Take 500 mg by mouth two (2) times daily (with meals).     ??? hydrOXYzine HCl (ATARAX) 10 mg tablet Take 10 mg by mouth nightly.     ??? augmented betamethasone dipropionate (DIPROLENE-AF) 0.05 % ointment Apply  to affected area two (2) times a day.     ??? HYDRAZINE SULFATE, BULK, by Does Not Apply route.     ??? naloxone (NARCAN) 4 mg/actuation nasal spray Use 1 spray intranasally into 1 nostril. Use a new Narcan nasal spray for subsequent doses and administer into alternating nostrils. May repeat every 2 to 3 minutes as needed for opioid overdose symptoms. 1 Each 0   ??? butalbital-acetaminophen-caffeine (FIORICET, ESGIC) 50-325-40 mg per  tablet Take 1 Tab by mouth every six (6) hours as needed. 30 Tab 0   ??? finasteride (PROSCAR) 5 mg tablet Take 1 Tab by mouth daily. 30 Tab 2   ??? tamsulosin (FLOMAX) 0.4 mg capsule TAKE 1 CAPSULE BY MOUTH EVERY DAY 90 Cap 0   ??? oxyCODONE IR (ROXICODONE) 20 mg immediate release tablet TK 1 T PO Q 6 HOURS  0   ??? pantoprazole (PROTONIX) 40 mg tablet TAKE 1 TABLET BY MOUTH DAILY 30 Tab 0   ??? aspirin 81 mg chewable tablet Take 81 mg by mouth daily.     ??? MULTIVIT-MIN/FA/LYCOPEN/LUTEIN (CENTRUM SILVER MEN PO) Take  by mouth.     ??? cholecalciferol, vitamin D3, (VITAMIN D3) 2,000 unit tab Take  by mouth.     ??? B.infantis-B.ani-B.long-B.bifi (PROBIOTIC 4X) 10-15 mg TbEC Take  by mouth.     ??? topiramate (TOPAMAX) 100 mg tablet Take 1 Tab by mouth daily. 90 Tab 3   ??? citalopram (CELEXA) 40 mg tablet Take 1 Tab by mouth daily. 90 Tab 3   ??? propranolol (INDERAL) 20 mg tablet Take  by mouth three (3) times daily.     ??? lactulose (CHRONULAC) 10 gram/15 mL solution Take  by mouth three (3) times daily.     ??? cyclobenzaprine (FLEXERIL) 10 mg tablet Take  by mouth three (3) times daily as needed for Muscle Spasm(s).         Review of Systems  Constitutional: The patient has no acute distress or discomfort.  HEENT: The patient denies recent head trauma, eye pain, blurred vision,  hearing deficit, oropharyngeal mucosal pain or lesions, and the patient denies throat pain or discomfort.  Lymphatics: The patient denies palpable peripheral lymphadenopathy.  Hematologic: The patient denies having bruising, bleeding, or progressive fatigue.  Respiratory: Patient denies having shortness of breath, cough, sputum production, fever, or dyspnea on exertion.  Cardiovascular: The patient denies having leg pain, leg swelling, heart palpitations, chest permit, chest pain, or lightheadedness.  The  patient denies having dyspnea on exertion.  Gastrointestinal: The patient denies having nausea, emesis, or diarrhea.  The patient denies having any hematemesis or blood in the stool.  Genitourinary: Patient denies having urinary urgency, frequency, or dysuria.  The patient denies having blood in the urine.  Psychological: The patient denies having symptoms of nervousness, anxiety, depression, or thoughts of harming self.  Skin: Patient denies having skin rashes, skin, ulcerations, or unexplained itching or pruritus.  Musculoskeletal: The patient denies having pain in the joints or bones.      Objective:     Visit Vitals   ??? BP 114/64   ??? Pulse 62   ??? Temp 98.8 ??F (37.1 ??C) (Oral)   ??? Resp 18   ??? Wt 59.9 kg (132 lb)   ??? BMI 19.48 kg/m2     ECOG PS=0  Physical Exam:   Gen. Appearance: The patient is in no acute distress.  Skin: There is no bruise or rash.  HEENT: The exam is unremarkable.  Neck: Supple without lymphadenopathy or thyromegaly.  Lungs: Clear to auscultation and percussion; there are no wheezes or rhonchi.  Heart: Regular rate and rhythm; there are no murmurs, gallops, or rubs.  Abdomen: Bowel sounds are present and normal.  There is no guarding, tenderness, or hepatosplenomegaly.  Extremities: There is no clubbing, cyanosis, or edema.  Neurologic: There are no focal neurologic deficits.  Lymphatics: There is no palpable peripheral lymphadenopathy. Musculoskeletal: The patient has full range of motion at all joints.  There is no evidence of joint deformity or effusions.  There is no focal joint tenderness.  Psychological/psychiatric: There is no clinical evidence of anxiety, depression, or melancholy.    Lab data:      Results for orders placed or performed during the hospital encounter of 02/23/17   CBC WITH 3 PART DIFF     Status: Abnormal   Result Value Ref Range Status    WBC 3.3 (L) 4.5 - 13.0 K/uL Final    RBC 3.76 (L) 4.10 - 5.10 M/uL Final    HGB 10.3 (L) 12.0 - 16.0 g/dL Final    HCT 34.0 (L) 36 - 48 % Final    MCV 90.4 78 - 102 FL Final    MCH 27.4 25.0 - 35.0 PG Final    MCHC 30.3 (L) 31 - 37 g/dL Final     RDW 22.1 (H) 11.5 - 14.5 % Final    PLATELET 92 (L) 140 - 440 K/uL Final    NEUTROPHILS 79 (H) 40 - 70 % Final    MIXED CELLS 7 0.1 - 17 % Final    LYMPHOCYTES 14 14 - 44 % Final    ABS. NEUTROPHILS 2.6 1.8 - 9.5 K/UL Final    ABS. MIXED CELLS 0.2 0.0 - 2.3 K/uL Final    ABS. LYMPHOCYTES 0.5 (L) 1.1 - 5.9 K/UL Final     Comment: Test performed at [facility]. Results Reviewed by Medical Director.    DF AUTOMATED   Final           Assessment:     1. Iron deficiency anemia secondary to inadequate dietary iron intake    2. 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 Myelodysplastic syndrome ([place])    4. Chronic leukopenia    5. Thrombocytopenia ([place])      Plan:   Myelodysplastic syndrome, refractory anemia due to myelodysplastic syndrome/iron deficiency anemia: Have explained to the patient that his hemoglobin  today is 10.3 g/dL with hematocrit of 34 %.  He did receive Procrit at a dose of 60,000 units yesterday.  I will check his iron profile and ferritin levels at this time.  If his ferritin level is below 100 and we will need to give him intravenous iron to replenish his ferritin level.    Chronic leukopenia: The CBC from today shows that his WBC count is 3.3 with an absolute neutrophil count which is normal at 2.6.  However the absolute lymphocyte count is low at 0.5.  These will continue to be monitored.  Therapeutic intervention is not warranted.    Thrombocytopenia: The patient has a stable lytic count of 85,000.  No therapeutic intervention is warranted.    Follow-up in 2 months  Orders Placed This Encounter   ??? COMPLETE CBC & AUTO DIFF WBC   ??? InHouse CBC ([facility])     Standing Status:   Future     Number of Occurrences:   1     Standing Expiration Date:   07/03/6061   ??? METABOLIC PANEL, COMPREHENSIVE     Standing Status:   Future     Standing Expiration Date:   02/24/2018   ??? IRON PROFILE     Standing Status:   Future     Standing Expiration Date:   02/24/2018    ??? FERRITIN     Standing Status:   Future     Standing Expiration Date:   02/24/2018       Suzy Bouchard, MD  02/23/2017      Please note: This document has been produced using voice recognition software.  Unrecognized errors in transcription may be present.

## 2017-02-24 LAB — IRON PROFILE
Iron % saturation: 10 %
Iron: 44 ug/dL — ABNORMAL LOW (ref 50–175)
TIBC: 446 ug/dL (ref 250–450)

## 2017-02-24 LAB — METABOLIC PANEL, COMPREHENSIVE
A-G Ratio: 1.3 (ref 0.8–1.7)
ALT (SGPT): 26 U/L (ref 16–61)
AST (SGOT): 35 U/L (ref 15–37)
Albumin: 4.2 g/dL (ref 3.4–5.0)
Alk. phosphatase: 200 U/L — ABNORMAL HIGH (ref 45–117)
Anion gap: 12 mmol/L (ref 3.0–18)
BUN/Creatinine ratio: 13 (ref 12–20)
BUN: 13 MG/DL (ref 7.0–18)
Bilirubin, total: 0.6 MG/DL (ref 0.2–1.0)
CO2: 21 mmol/L (ref 21–32)
Calcium: 9.5 MG/DL (ref 8.5–10.1)
Chloride: 108 mmol/L (ref 100–108)
Creatinine: 1.02 MG/DL (ref 0.6–1.3)
GFR est AA: >60 mL/min/{1.73_m2} (ref >60)
GFR est non-AA: >60 mL/min/{1.73_m2} (ref >60)
Globulin: 3.3 g/dL (ref 2.0–4.0)
Glucose: 101 mg/dL — ABNORMAL HIGH (ref 74–99)
Potassium: 4.4 mmol/L (ref 3.5–5.5)
Protein, total: 7.5 g/dL (ref 6.4–8.2)
Sodium: 141 mmol/L (ref 136–145)

## 2017-02-24 LAB — FERRITIN: Ferritin: 63 NG/ML (ref 8–388)

## 2017-03-01 ENCOUNTER — Encounter: Payer: MEDICARE | Primary: Legal Medicine

## 2017-03-03 MED ORDER — EPOETIN ALFA 20,000 UNIT/ML IJ SOLN
20000 unit/mL | Freq: Once | INTRAMUSCULAR | Status: DC
Start: 2017-03-03 — End: 2017-03-03

## 2017-03-03 MED ORDER — EPOETIN ALFA 40,000 UNIT/ML IJ SOLN
40000 unit/mL | Freq: Once | INTRAMUSCULAR | Status: DC
Start: 2017-03-03 — End: 2017-03-03

## 2017-03-04 ENCOUNTER — Encounter

## 2017-03-04 NOTE — Telephone Encounter (Signed)
Pt calling to request medication refill of:  Requested Prescriptions     Pending Prescriptions Disp Refills   ??? propranolol (INDERAL) 20 mg tablet       Sig: Take  by mouth three (3) times daily.          be sent to [id number] - [place], [place] - [address] AT [place]  Pt has about 0 tabs remaining.     Pts last appt was 8/20  Advised pt of 72 hour time frame for refill requests. Please advise.

## 2017-03-07 ENCOUNTER — Institutional Professional Consult (permissible substitution): Admit: 2017-03-07 | Discharge: 2017-03-07 | Payer: PRIVATE HEALTH INSURANCE | Primary: Legal Medicine

## 2017-03-07 ENCOUNTER — Inpatient Hospital Stay: Admit: 2017-03-07 | Payer: MEDICARE | Primary: Legal Medicine

## 2017-03-07 ENCOUNTER — Inpatient Hospital Stay: Admit: 2017-03-07 | Primary: Legal Medicine

## 2017-03-07 DIAGNOSIS — D696 Thrombocytopenia, unspecified: Secondary | ICD-10-CM

## 2017-03-07 DIAGNOSIS — D649 Anemia, unspecified: Secondary | ICD-10-CM

## 2017-03-07 LAB — CBC WITH 3 PART DIFF
ABS. LYMPHOCYTES: 0.5 10*3/uL — ABNORMAL LOW (ref 1.1–5.9)
ABS. MIXED CELLS: 0.5 10*3/uL (ref 0.0–2.3)
ABS. NEUTROPHILS: 3.4 10*3/uL (ref 1.8–9.5)
HCT: 31.5 % — ABNORMAL LOW (ref 36–48)
HGB: 9.6 g/dL — ABNORMAL LOW (ref 12.0–16.0)
LYMPHOCYTES: 12 % — ABNORMAL LOW (ref 14–44)
MCH: 27.6 PG (ref 25.0–35.0)
MCHC: 30.5 g/dL — ABNORMAL LOW (ref 31–37)
MCV: 90.5 FL (ref 78–102)
Mixed cells: 11 % (ref 0.1–17)
NEUTROPHILS: 77 % — ABNORMAL HIGH (ref 40–70)
PLATELET: 102 10*3/uL — ABNORMAL LOW (ref 140–440)
RBC: 3.48 M/uL — ABNORMAL LOW (ref 4.10–5.10)
RDW: 21.1 % — ABNORMAL HIGH (ref 11.5–14.5)
WBC: 4.4 10*3/uL — ABNORMAL LOW (ref 4.5–13.0)

## 2017-03-07 MED ORDER — EPOETIN ALFA 20,000 UNIT/ML IJ SOLN
20000 unit/mL | Freq: Once | INTRAMUSCULAR | Status: AC
Start: 2017-03-07 — End: 2017-03-08

## 2017-03-07 MED ORDER — EPOETIN ALFA 40,000 UNIT/ML IJ SOLN
40000 unit/mL | Freq: Once | INTRAMUSCULAR | Status: AC
Start: 2017-03-07 — End: 2017-03-08

## 2017-03-07 NOTE — Progress Notes (Signed)
[facility] Progress Note    Date: March 07, 2017    Name: William [facility] Sr.    MRN: [id number]         DOB: [birth year]      [name] was assessed and education was provided.     [name] vitals were reviewed and patient was observed for 5 minutes prior to treatment.   Visit Vitals   ??? Temp 97.7 ??F (36.5 ??C)   ??? Resp 16       Lab results were obtained and reviewed.  Recent Results (from the past 12 hour(s))   CBC WITH 3 PART DIFF    Collection Time: 03/07/17  2:50 PM   Result Value Ref Range    WBC 4.4 (L) 4.5 - 13.0 K/uL    RBC 3.48 (L) 4.10 - 5.10 M/uL    HGB 9.6 (L) 12.0 - 16.0 g/dL    HCT 31.5 (L) 36 - 48 %    MCV 90.5 78 - 102 FL    MCH 27.6 25.0 - 35.0 PG    MCHC 30.5 (L) 31 - 37 g/dL    RDW 21.1 (H) 11.5 - 14.5 %    PLATELET 102 (L) 140 - 440 K/uL    NEUTROPHILS 77 (H) 40 - 70 %    MIXED CELLS 11 0.1 - 17 %    LYMPHOCYTES 12 (L) 14 - 44 %    ABS. NEUTROPHILS 3.4 1.8 - 9.5 K/UL    ABS. MIXED CELLS 0.5 0.0 - 2.3 K/uL    ABS. LYMPHOCYTES 0.5 (L) 1.1 - 5.9 K/UL    DF AUTOMATED         Procrit not given for H&H 9.6/31.5.      Patient armband removed and shredded.    [name] was discharged from [place] in stable condition at 1500. He is to return on 03/22/17 at 1000 for his next appointment for CBC/Procrit Q 2 wks.    Lynnda Shields, RN  March 07, 2017  3:23 PM

## 2017-03-08 ENCOUNTER — Inpatient Hospital Stay: Payer: MEDICARE | Primary: Legal Medicine

## 2017-03-08 MED ORDER — PROPRANOLOL 20 MG TAB
20 mg | ORAL_TABLET | Freq: Three times a day (TID) | ORAL | 2 refills | Status: DC
Start: 2017-03-08 — End: 2017-07-14

## 2017-03-15 ENCOUNTER — Encounter: Payer: MEDICARE | Primary: Legal Medicine

## 2017-03-22 ENCOUNTER — Inpatient Hospital Stay: Admit: 2017-03-22 | Primary: Legal Medicine

## 2017-03-22 ENCOUNTER — Institutional Professional Consult (permissible substitution): Primary: Legal Medicine

## 2017-03-22 DIAGNOSIS — D509 Iron deficiency anemia, unspecified: Secondary | ICD-10-CM

## 2017-03-22 NOTE — Progress Notes (Signed)
William Blanchard called to cancel his appt today due to dental pain. I spoke with his wife and she stated that he has an appt with dental surgeon 03/24/17, Thursday.  She does not want to reschedule today's appt until she knows what will be happening with William Bozzi. She will be calling back to reschedule.

## 2017-03-23 ENCOUNTER — Inpatient Hospital Stay: Payer: MEDICARE | Primary: Legal Medicine

## 2017-03-24 MED ORDER — EPOETIN ALFA 40,000 UNIT/ML IJ SOLN
40000 unit/mL | Freq: Once | INTRAMUSCULAR | Status: DC
Start: 2017-03-24 — End: 2017-03-24

## 2017-03-24 MED ORDER — EPOETIN ALFA 20,000 UNIT/ML IJ SOLN
20000 unit/mL | Freq: Once | INTRAMUSCULAR | Status: DC
Start: 2017-03-24 — End: 2017-03-24

## 2017-03-27 IMAGING — MR MR ABDOMEN WO/W CM
7 of 17 series · 19 of 48 positions shown · IV contrast (7ml Eovist)
Comparison: 03/22/2014 CT.  01/01/2014 MRI.

CLINICAL DATA: Cirrhosis secondary to nonalcoholic steatohepatitis.
Right upper quadrant pain for 5 years. Left hepatic lobe lesions.

EXAM:
MRI ABDOMEN WITHOUT AND WITH CONTRAST
TECHNIQUE: Multiplanar multisequence MR imaging of the abdomen was performed
both before and after the administration of intravenous contrast.
CONTRAST:  7.0 ml Eovist, a mixed extracellular and hepatocyte
specific contrast agent.

[Series 4: T2 · coronal · 5.0mm · 1.41mm/px · 1 of 36 slices shown]
[im 1/36]
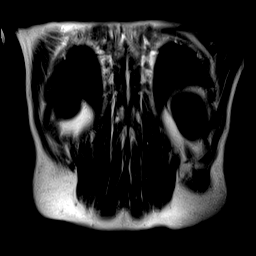

[Series 5: axial in out · axial · 6.0mm · 0.68mm/px · 1 of 68 slices shown]
[im 1/68]
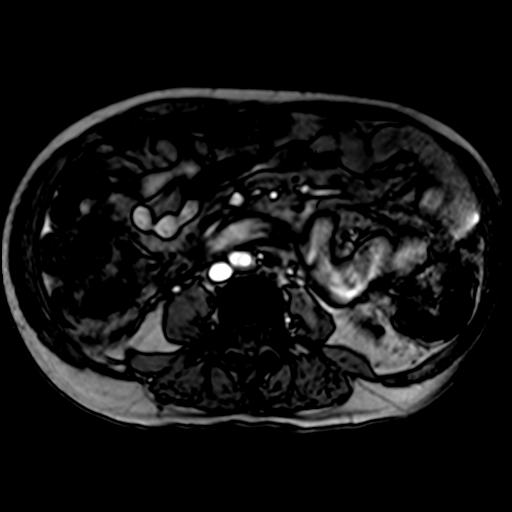

[Series 6: T1 dynamic · axial · non-contrast · 2.2mm · 0.72mm/px · z∈[-119,+90]mm · 4 of 96 slices shown]
[im 1/96]
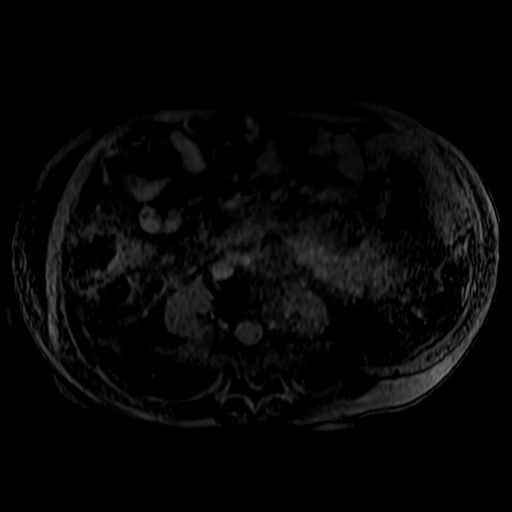
[im 32/96]
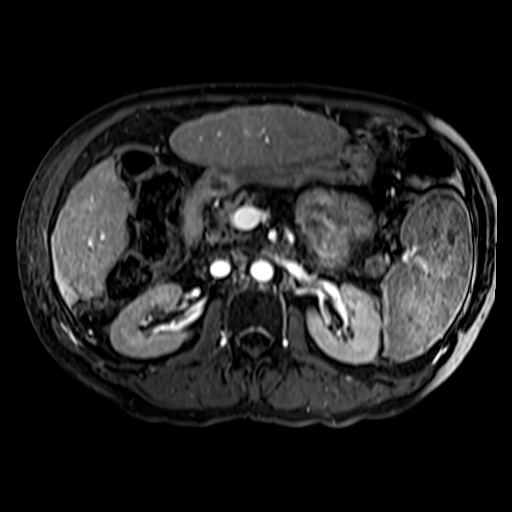
[im 64/96]
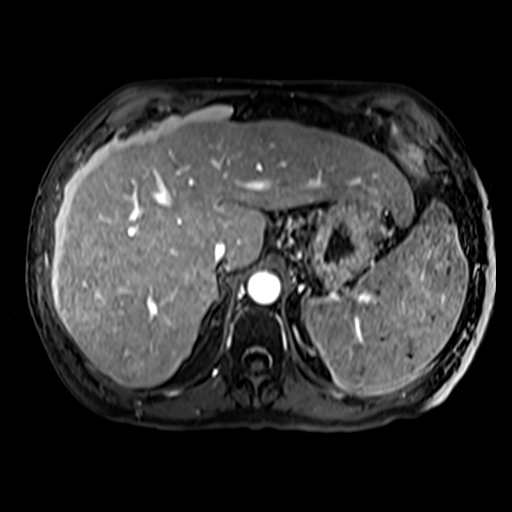
[im 96/96]
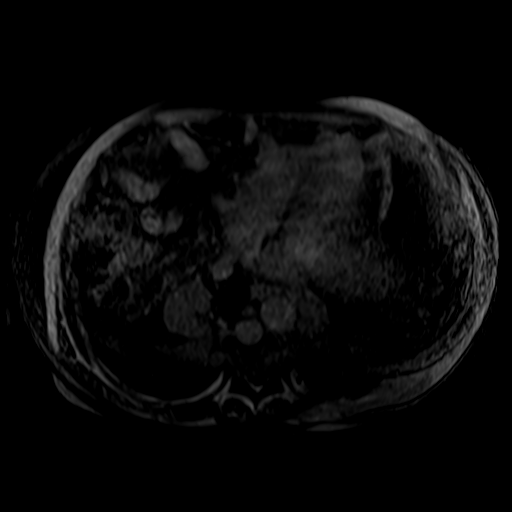

[Series 7: post immed · axial · 2.2mm · 0.72mm/px · z∈[-119,+90]mm · 4 of 96 slices shown]
[im 1/96]
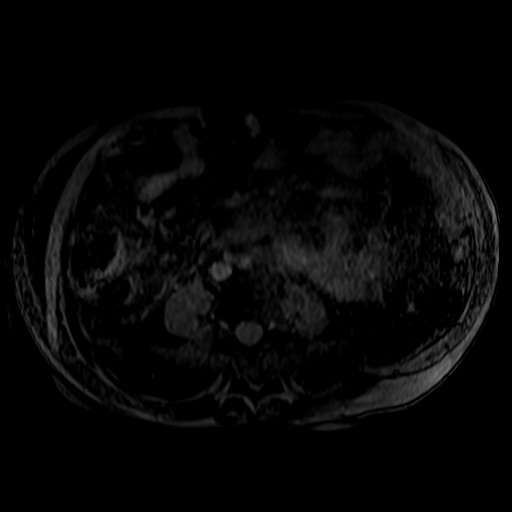
[im 32/96]
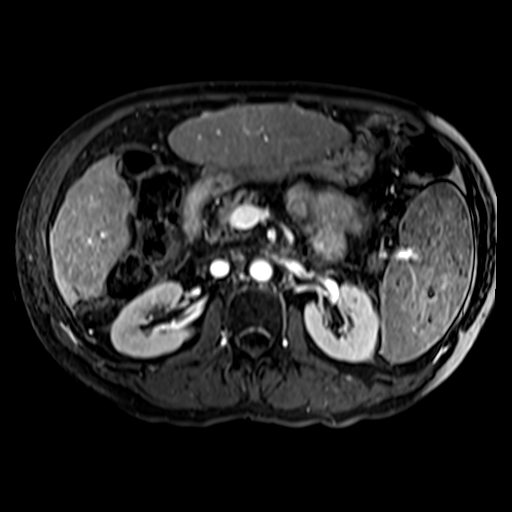
[im 64/96]
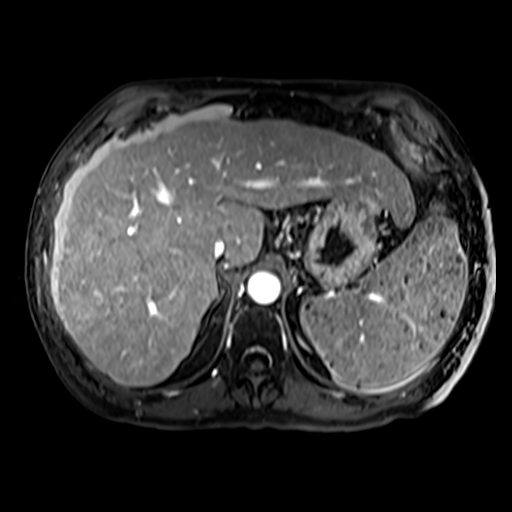
[im 96/96]
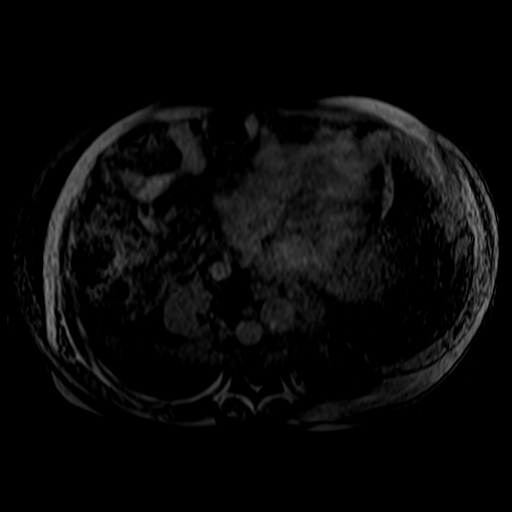

[Series 8: post immed_sub · axial · 2.2mm · 0.72mm/px · z∈[-119,+90]mm · 4 of 96 slices shown]
[im 1/96]
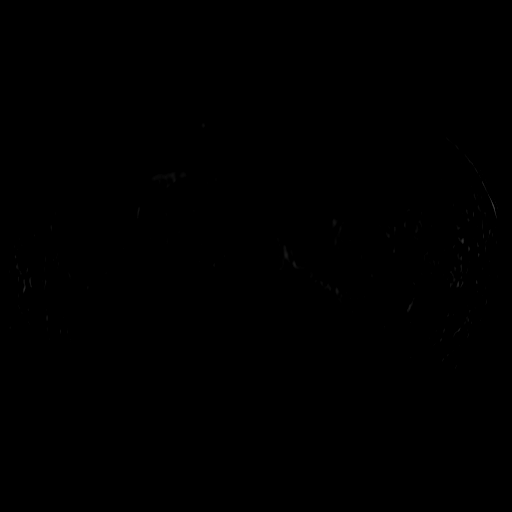
[im 32/96]
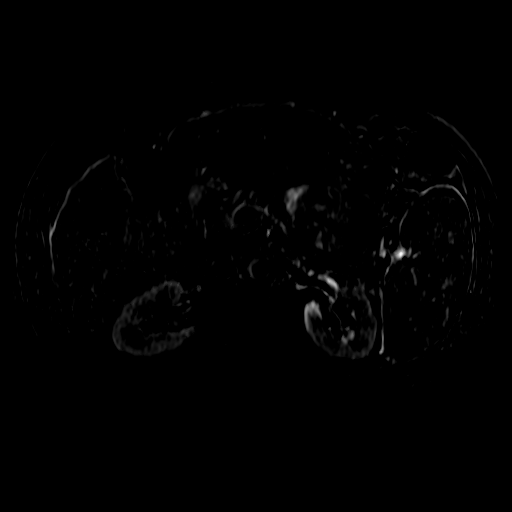
[im 64/96]
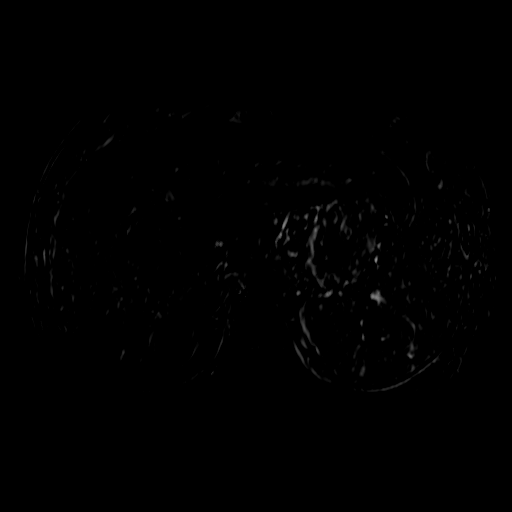
[im 96/96]
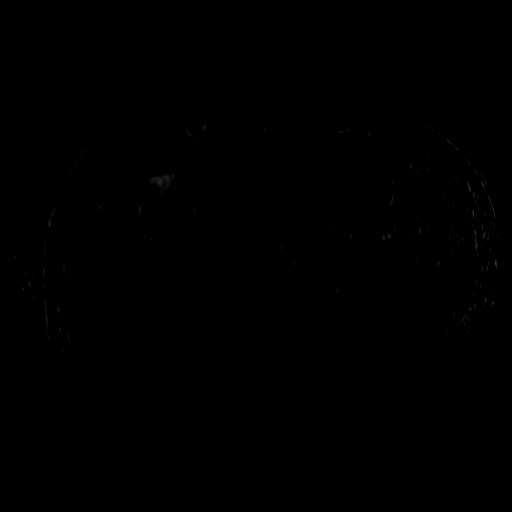

[Series 9: post 45 sec · axial · 2.2mm · 0.72mm/px · z∈[-119,+90]mm · 4 of 96 slices shown]
[im 1/96]
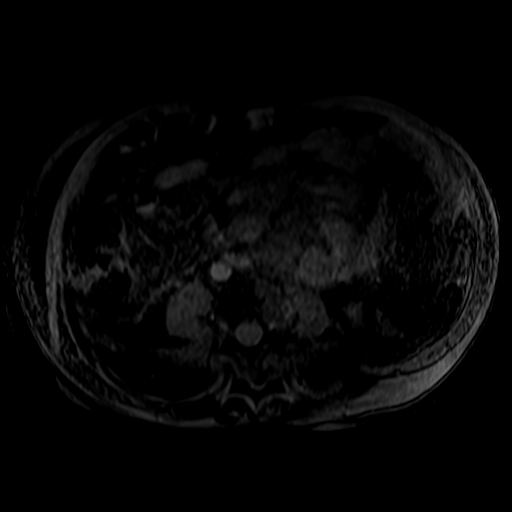
[im 32/96]
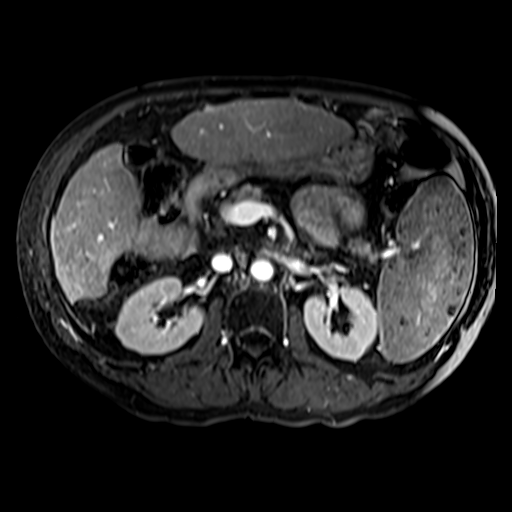
[im 64/96]
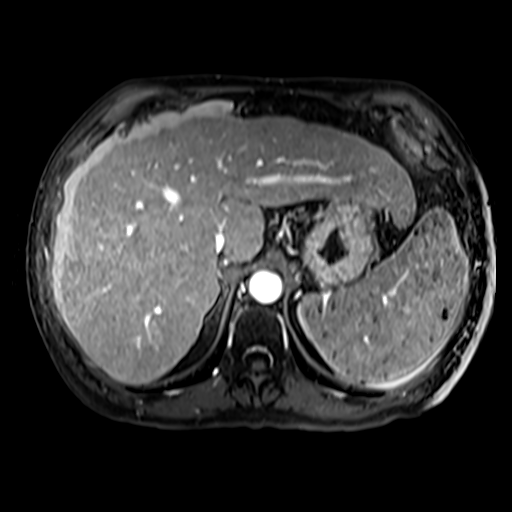
[im 96/96]
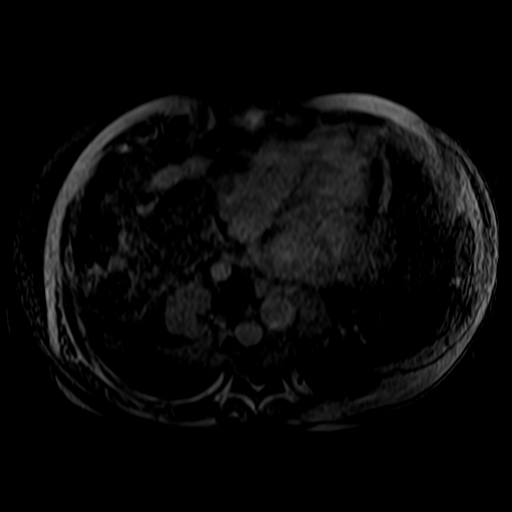

[Series 10: post 45 sec_sub · axial · 2.2mm · 0.72mm/px · 1 of 95 slices shown]
[im 1/95]
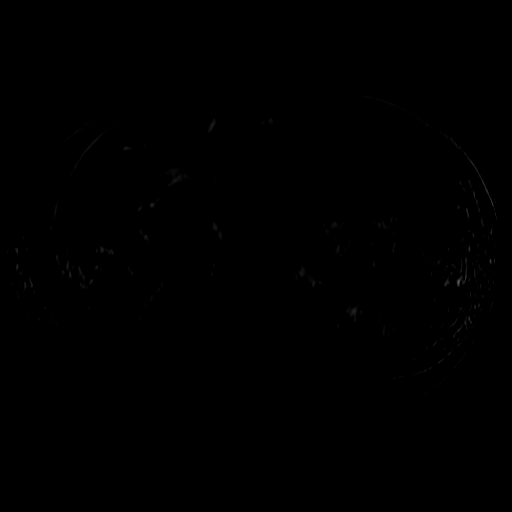

[19 of 48 positions shown; findings below may reference images not displayed]

FINDINGS: Portions of exam are mildly motion degraded.

Lower chest: Normal heart size without pericardial or pleural
effusion. Periesophageal varices. Small hiatal hernia.

Hepatobiliary: Marked cirrhosis. Decreased hepatic signal on long TE
images, consistent with iron deposition. No early post-contrast
enhancement, T2 hyperintense foci, or delayed hypo enhancement to
suggest hepatocellular carcinoma. Patent hepatic and portal veins.
Cholecystectomy, without biliary ductal dilatation.

Pancreas: Similar 5 mm pancreatic neck cystic lesion. No duct
dilatation or evidence of acute pancreatitis.

Spleen: Splenomegaly, 17.4 cm craniocaudal. Iron deposition within,
as evidenced by decreased T2 signal. Foci of greater T2
hypointensity within the spleen are likely related to Klever Jumper
bodies.

Adrenals/Urinary Tract: Normal adrenal glands. Bilateral small renal
cysts, without hydronephrosis.

Stomach/Bowel: Underdistended remainder of the stomach. Normal
abdominal large and small bowel loops.

Vascular/Lymphatic: Aortic and branch vessel atherosclerosis. No
splenic artery aneurysm. No abdominal adenopathy.

Other: Since 01/01/2014, the extent of small volume perihepatic and
perisplenic fluid is increased. There is new complexity within the
fluid, as evidenced by precontrast T1 hyperintensity. Example image
10 of series 5.

Musculoskeletal: Suspect a mid thoracic hemangioma with vague T2
hyperintensity, similar.
IMPRESSION: 1. Advanced cirrhosis with portal venous hypertension. No evidence
of hepatocellular carcinoma.
2. Iron deposition in the liver and spleen, most consistent with
hemosiderosis.
3. Slight increase in small volume perihepatic and perisplenic
fluid. Precontrast T1 hyperintensity, consistent with complexity.
Suspicious for small volume perihepatic hemorrhage. No source is
identified. These results will be called to the ordering clinician
or representative by the Radiologist Assistant, and communication
documented in the PACS or zVision Dashboard.
4. Similar 5 mm pancreatic neck cystic lesion, likely a tiny
pseudocysts.
5. Mild motion degradation.

## 2017-03-28 ENCOUNTER — Inpatient Hospital Stay: Admit: 2017-03-28 | Primary: Legal Medicine

## 2017-03-28 ENCOUNTER — Inpatient Hospital Stay: Admit: 2017-03-28 | Payer: MEDICARE | Primary: Legal Medicine

## 2017-03-28 ENCOUNTER — Institutional Professional Consult (permissible substitution): Admit: 2017-03-28 | Discharge: 2017-03-28 | Payer: PRIVATE HEALTH INSURANCE | Primary: Legal Medicine

## 2017-03-28 DIAGNOSIS — D696 Thrombocytopenia, unspecified: Secondary | ICD-10-CM

## 2017-03-28 LAB — CBC WITH 3 PART DIFF
ABS. LYMPHOCYTES: 0.4 10*3/uL — ABNORMAL LOW (ref 1.1–5.9)
ABS. MIXED CELLS: 0.4 10*3/uL (ref 0.0–2.3)
ABS. NEUTROPHILS: 2.8 10*3/uL (ref 1.8–9.5)
HCT: 32.2 % — ABNORMAL LOW (ref 36–48)
HGB: 10 g/dL — ABNORMAL LOW (ref 12.0–16.0)
LYMPHOCYTES: 12 % — ABNORMAL LOW (ref 14–44)
MCH: 28 PG (ref 25.0–35.0)
MCHC: 31.1 g/dL (ref 31–37)
MCV: 90.2 FL (ref 78–102)
Mixed cells: 10 % (ref 0.1–17)
NEUTROPHILS: 78 % — ABNORMAL HIGH (ref 40–70)
PLATELET: 102 10*3/uL — ABNORMAL LOW (ref 140–440)
RBC: 3.57 M/uL — ABNORMAL LOW (ref 4.10–5.10)
RDW: 16.2 % — ABNORMAL HIGH (ref 11.5–14.5)
WBC: 3.6 10*3/uL — ABNORMAL LOW (ref 4.5–13.0)

## 2017-03-28 MED ORDER — EPOETIN ALFA 20,000 UNIT/ML IJ SOLN
20000 unit/mL | Freq: Once | INTRAMUSCULAR | Status: AC
Start: 2017-03-28 — End: 2017-03-29

## 2017-03-28 MED ORDER — SUCRALFATE 1 GRAM TAB
1 gram | ORAL_TABLET | ORAL | 0 refills | Status: DC
Start: 2017-03-28 — End: 2017-04-29

## 2017-03-28 MED ORDER — EPOETIN ALFA 40,000 UNIT/ML IJ SOLN
40000 unit/mL | Freq: Once | INTRAMUSCULAR | Status: AC
Start: 2017-03-28 — End: 2017-03-29

## 2017-03-28 NOTE — Progress Notes (Signed)
[facility] OPIC Progress Note    Date: March 28, 2017    Name: William Blanchard [facility] Sr.    MRN: [id number]         DOB: 20-Jun-1946      Mr. Giannattasio was assessed and education was provided.     [name] vitals were reviewed and patient was observed for 5 minutes prior to treatment.   Visit Vitals   ??? BP 102/56 (BP 1 Location: Left arm, BP Patient Position: Sitting)   ??? Pulse 61   ??? Temp 98.3 ??F (36.8 ??C)   ??? Resp 16       Lab results were obtained and reviewed.  Recent Results (from the past 12 hour(s))   CBC WITH 3 PART DIFF    Collection Time: 03/28/17  2:05 PM   Result Value Ref Range    WBC 3.6 (L) 4.5 - 13.0 K/uL    RBC 3.57 (L) 4.10 - 5.10 M/uL    HGB 10.0 (L) 12.0 - 16.0 g/dL    HCT 32.2 (L) 36 - 48 %    MCV 90.2 78 - 102 FL    MCH 28.0 25.0 - 35.0 PG    MCHC 31.1 31 - 37 g/dL    RDW 16.2 (H) 11.5 - 14.5 %    PLATELET 102 (L) 140 - 440 K/uL    NEUTROPHILS 78 (H) 40 - 70 %    MIXED CELLS 10 0.1 - 17 %    LYMPHOCYTES 12 (L) 14 - 44 %    ABS. NEUTROPHILS 2.8 1.8 - 9.5 K/UL    ABS. MIXED CELLS 0.4 0.0 - 2.3 K/uL    ABS. LYMPHOCYTES 0.4 (L) 1.1 - 5.9 K/UL    DF AUTOMATED         Procrit not given for H&H 10.0/32.2.     Patient armband removed and shredded.    Mr. Ghrist was discharged from [facility] in stable condition at 1415. He is to return on 04/11/17 at 1400 for his next appointment for CBc/Procrit Q 2 wks.    Lynnda Shields, RN  March 28, 2017  2:15 PM

## 2017-03-29 ENCOUNTER — Encounter: Payer: MEDICARE | Primary: Legal Medicine

## 2017-03-29 NOTE — Progress Notes (Signed)
Pt completed his a1c 06/30/16, it was 1/3.

## 2017-03-31 ENCOUNTER — Ambulatory Visit
Admit: 2017-03-31 | Discharge: 2017-03-31 | Payer: PRIVATE HEALTH INSURANCE | Attending: Legal Medicine | Primary: Legal Medicine

## 2017-03-31 DIAGNOSIS — E1169 Type 2 diabetes mellitus with other specified complication: Secondary | ICD-10-CM

## 2017-03-31 MED ORDER — LORAZEPAM 1 MG TAB
1 mg | ORAL_TABLET | Freq: Three times a day (TID) | ORAL | 0 refills | Status: DC | PRN
Start: 2017-03-31 — End: 2017-05-24

## 2017-03-31 MED ORDER — HUMULIN R REGULAR U-100 INSULIN 100 UNIT/ML INJECTION SOLUTION
100 unit/mL | INTRAMUSCULAR | 6 refills | Status: DC
Start: 2017-03-31 — End: 2018-10-17

## 2017-03-31 MED ORDER — INSULIN SYRINGE U-100 WITH NEEDLE 1 ML 29 GAUGE X 1/2"
1 mL 29 gauge x /2" | INJECTION | 6 refills | Status: AC
Start: 2017-03-31 — End: ?

## 2017-03-31 NOTE — Progress Notes (Signed)
William Blalock Sweeting Sr. is a 71 y.o. male (DOB: 06/07/1946) presenting to address:    Chief Complaint   Patient presents with   ??? Medication Refill       Vitals:    03/31/17 1516   BP: 103/60   Pulse: 81   Resp: 18   Temp: 99 ??F (37.2 ??C)   TempSrc: Oral   SpO2: 99%   Weight: 133 lb (60.3 kg)   Height: 5' 9.02" (1.753 m)   PainSc:   6   PainLoc: Back       Hearing/Vision:   No exam data present    Learning Assessment:     Learning Assessment 08/02/2016   PRIMARY LEARNER Patient   HIGHEST LEVEL OF EDUCATION - PRIMARY LEARNER  -   BARRIERS PRIMARY LEARNER -   CO-LEARNER CAREGIVER -   PRIMARY LANGUAGE ENGLISH   LEARNER PREFERENCE PRIMARY DEMONSTRATION     READING   ANSWERED BY Patient   RELATIONSHIP SELF     Depression Screening:     PHQ over the last two weeks 10/22/2016   Little interest or pleasure in doing things Not at all   Feeling down, depressed, irritable, or hopeless Not at all   Total Score PHQ 2 0     Fall Risk Assessment:     Fall Risk Assessment, last 12 mths 03/31/2017   Able to walk? Yes   Fall in past 12 months? Yes   Fall with injury? No   Number of falls in past 12 months 1   Fall Risk Score 1     Abuse Screening:     Abuse Screening Questionnaire 03/31/2017   Do you ever feel afraid of your partner? N   Are you in a relationship with someone who physically or mentally threatens you? N   Is it safe for you to go home? Y     Coordination of Care Questionaire:   1. Have you been to the ER, urgent care clinic since your last visit?  Hospitalized since your last visit? NO    2. Have you seen or consulted any other health care providers outside of the [facility] since your last visit?  Include any pap smears or colon screening. NO

## 2017-03-31 NOTE — Progress Notes (Signed)
William Blalock Flath Sr.     Chief Complaint   Patient presents with   ??? Medication Refill     Vitals:    03/31/17 1516   BP: 103/60   Pulse: 81   Resp: 18   Temp: 99 ??F (37.2 ??C)   TempSrc: Oral   SpO2: 99%   Weight: 133 lb (60.3 kg)   Height: 5' 9.02" (1.753 m)   PainSc:   6   PainLoc: Back         HPI: Patient is here to follow-up on anxiety needs refill for Ativan as needed for anxiety.  Patient had a history of diabetes type 2 he has been following up with his previous [facility] care physician and he was getting only insulin as per sliding scale lasting globin A1c was 5.6.  Patient blood sugar did not go higher than 150 160 and there is no episodes of hypoglycemia.  And he needs his insulin couple of times a week,  Patient having intermittent abdominal pain associated with nausea CT scan was ordered last visit but was not done a printed copy of the order was given to his wife and it will be sent to [facility].      Past Medical History:   Diagnosis Date   ??? Anemia    ??? Anxiety    ??? Chronic pain    ??? Cirrhosis of liver ([place])    ??? Diabetes ([place])    ??? GERD (gastroesophageal reflux disease)    ??? Hyperlipemia    ??? Hypertension    ??? Hypotension    ??? Migraine    ??? Other cirrhosis of liver ([place])    ??? Stroke [facility])     had 3 strokes    ??? Thrombocytopenia ([place])      Past Surgical History:   Procedure Laterality Date   ??? HX CHOLECYSTECTOMY     ??? HX ORTHOPAEDIC      R hand sx   ??? HX OTHER SURGICAL      Hand surgery- Nail gun went through finger     Social History   Substance Use Topics   ??? Smoking status: Former Smoker   ??? Smokeless tobacco: Never Used      Comment: quit years ago   ??? Alcohol use No       Family History   Problem Relation Age of Onset   ??? Alzheimer Mother    ??? Diabetes Mother    ??? Cancer Mother      colon cancer    ??? Hypertension Father    ??? Heart Disease Father    ??? Cancer Father      prostate, lung cancer    ??? Diabetes Sister    ??? Heart Disease Sister    ??? Diabetes Brother        Review of Systems    HENT: Positive for ear discharge and ear pain. Negative for sinus pain and sore throat.    Respiratory: Negative for cough, hemoptysis, sputum production and shortness of breath.    Cardiovascular: Negative for chest pain, palpitations and orthopnea.   Gastrointestinal: Positive for abdominal pain, heartburn and nausea. Negative for blood in stool, constipation and melena.   Genitourinary: Negative for dysuria, frequency and hematuria.   Musculoskeletal: Positive for back pain.   Neurological: Negative for dizziness and headaches.   Psychiatric/Behavioral: Negative for depression, hallucinations, substance abuse and suicidal ideas. The patient is nervous/anxious and has insomnia.  Physical Exam   Constitutional: He is oriented to person, place, and time. He appears well-developed and well-nourished. No distress.   HENT:   Head: Normocephalic and atraumatic.   Mouth/Throat: No oropharyngeal exudate.   Eyes: Conjunctivae are normal. Pupils are equal, round, and reactive to light. Right eye exhibits no discharge. Left eye exhibits no discharge. No scleral icterus.   Neck: Normal range of motion. Neck supple. No thyromegaly present.   Cardiovascular: Normal rate, regular rhythm and normal heart sounds.    Pulmonary/Chest: Effort normal and breath sounds normal. No respiratory distress. He has no rales.   Abdominal: Soft. Bowel sounds are normal. He exhibits no distension. There is tenderness. There is no rebound and no guarding.   Musculoskeletal: Normal range of motion. He exhibits no edema, tenderness or deformity.   Lymphadenopathy:     He has no cervical adenopathy.   Neurological: He is alert and oriented to person, place, and time. No cranial nerve deficit. Coordination normal.   Skin: Skin is warm and dry. No rash noted. He is not diaphoretic. No erythema.   Psychiatric: He has a normal mood and affect. Judgment and thought content normal.        Assessment and plan     1. Anxiety   Stable on Ativan as needed  - LORazepam (ATIVAN) 1 mg tablet; Take 1 Tab by mouth every eight (8) hours as needed for Anxiety for up to 90 days. Max Daily Amount: 3 mg.  Dispense: 90 Tab; Refill: 0    2. Type 2 diabetes mellitus with other specified complication, with long-term current use of insulin ([place])  Patient has type 2 diabetes last hemoglobin A1c 5.6 was last year, he only on a sliding scale for his high blood sugar and diabetes going on for years with his previous provider.  No no episodes of blood sugar higher than 160 and there is no hypoglycemia  - HUMULIN R REGULAR U-100 INSULN 100 unit/mL injection; As needed per sliding scale  Dispense: 1 Vial; Refill: 6  - Insulin Syringe-Needle U-100 1 mL 29 gauge x 1/2" syrg; Three times per day and as needed as per sliding scale  Dispense: 100 Syringe; Refill: 6    3. Essential hypertension  Well-controlled on current medications    Current Outpatient Prescriptions   Medication Sig Dispense Refill   ??? HUMULIN R REGULAR U-100 INSULN 100 unit/mL injection As needed per sliding scale 1 Vial 6   ??? Insulin Syringe-Needle U-100 1 mL 29 gauge x 1/2" syrg Three times per day and as needed as per sliding scale 100 Syringe 6   ??? LORazepam (ATIVAN) 1 mg tablet Take 1 Tab by mouth every eight (8) hours as needed for Anxiety for up to 90 days. Max Daily Amount: 3 mg. 90 Tab 0   ??? sucralfate (CARAFATE) 1 gram tablet TAKE 1 TABLET BY MOUTH FOUR TIMES DAILY WITH MEALS AND AT BEDTIME 120 Tab 0   ??? metFORMIN (GLUCOPHAGE) 500 mg tablet TAKE 2 TABLETS BY MOUTH TWICE DAILY WITH MEALS(GENERIC FOR GLUCOPHAGE) 360 Tab 0   ??? prochlorperazine (COMPAZINE) 10 mg tablet Take 0.5 Tabs by mouth every six (6) hours as needed. 30 Tab 5   ??? gemfibrozil (LOPID) 600 mg tablet TAKE 1 TABLET BY MOUTH TWICE DAILY 180 Tab 3   ??? ketoconazole (NIZORAL) 2 % topical cream Apply  to affected area daily. 15 g 0   ??? doxepin (SINEQUAN) 25 mg capsule Take  by mouth nightly.      ???  L-Methylfolate-B12-Acetylcyst (CEREFOLIN) tablet Take 1 Tab by mouth daily.     ??? metaxalone (SKELAXIN) 800 mg tablet Take 800 mg by mouth three (3) times daily as needed.     ??? hydrOXYzine HCl (ATARAX) 10 mg tablet Take 10 mg by mouth nightly.     ??? augmented betamethasone dipropionate (DIPROLENE-AF) 0.05 % ointment Apply  to affected area two (2) times a day.     ??? HYDRAZINE SULFATE, BULK, by Does Not Apply route.     ??? naloxone (NARCAN) 4 mg/actuation nasal spray Use 1 spray intranasally into 1 nostril. Use a new Narcan nasal spray for subsequent doses and administer into alternating nostrils. May repeat every 2 to 3 minutes as needed for opioid overdose symptoms. 1 Each 0   ??? butalbital-acetaminophen-caffeine (FIORICET, ESGIC) 50-325-40 mg per tablet Take 1 Tab by mouth every six (6) hours as needed. 30 Tab 0   ??? finasteride (PROSCAR) 5 mg tablet Take 1 Tab by mouth daily. 30 Tab 2   ??? oxyCODONE IR (ROXICODONE) 20 mg immediate release tablet TK 1 T PO Q 6 HOURS  0   ??? pantoprazole (PROTONIX) 40 mg tablet TAKE 1 TABLET BY MOUTH DAILY 30 Tab 0   ??? aspirin 81 mg chewable tablet Take 81 mg by mouth daily.     ??? MULTIVIT-MIN/FA/LYCOPEN/LUTEIN (CENTRUM SILVER MEN PO) Take  by mouth.     ??? cholecalciferol, vitamin D3, (VITAMIN D3) 2,000 unit tab Take  by mouth.     ??? B.infantis-B.ani-B.long-B.bifi (PROBIOTIC 4X) 10-15 mg TbEC Take  by mouth.     ??? citalopram (CELEXA) 40 mg tablet Take 1 Tab by mouth daily. 90 Tab 3   ??? lactulose (CHRONULAC) 10 gram/15 mL solution Take  by mouth three (3) times daily.     ??? cyclobenzaprine (FLEXERIL) 10 mg tablet Take  by mouth three (3) times daily as needed for Muscle Spasm(s).     ??? propranolol (INDERAL) 20 mg tablet Take 1 Tab by mouth three (3) times daily. (Patient taking differently: Take 20 mg by mouth two (2) times a day.) 90 Tab 2   ??? topiramate (TOPAMAX) 100 mg tablet Take 1 Tab by mouth daily. 90 Tab 3       Patient Active Problem List    Diagnosis Date Noted    ??? 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 Dermatitis 01/05/2017   ??? Benign prostatic hyperplasia (BPH) with straining on urination 12/31/2016   ??? 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 Myelodysplastic syndrome ([address]) 12/22/2016   ??? Type 2 diabetes with nephropathy ([place]) 10/18/2016   ??? Iron deficiency anemia secondary to inadequate dietary iron intake 09/23/2016   ??? Chronic leukopenia 09/23/2016   ??? Thrombocytopenia ([place]) 09/23/2016   ??? 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 Type II diabetes mellitus ([place]) 08/03/2016   ??? HTN (hypertension) 08/03/2016   ??? 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 Stroke ([place]) 08/03/2016   ??? Compressed vertebrae ([place]) 08/03/2016   ??? Chronic back pain greater than 3 months duration 08/03/2016   ??? Hyperlipemia 08/03/2016   ??? Anxiety 08/03/2016   ??? Depression 06/30/2016   ??? Iron deficiency anemia 04/22/2016   ??? NAFLD (nonalcoholic fatty liver disease) 04/22/2016   ??? Other cirrhosis of liver ([place]) 04/22/2016   ??? Chronic anemia 03/18/2016   ??? Controlled type 2 diabetes mellitus without complication, without long-term current use of insulin ([place]) 03/18/2016   ??? Essential hypertension 03/18/2016   ??? Hyperlipidemia  03/18/2016   ??? History of stroke 03/18/2016   ??? Anxiety 03/18/2016   ??? Chronic pain 03/18/2016     Results for orders placed or performed during the hospital encounter of 03/28/17   CBC WITH 3 PART DIFF   Result Value Ref Range    WBC 3.6 (L) 4.5 - 13.0 K/uL    RBC 3.57 (L) 4.10 - 5.10 M/uL    HGB 10.0 (L) 12.0 - 16.0 g/dL    HCT 32.2 (L) 36 - 48 %    MCV 90.2 78 - 102 FL    MCH 28.0 25.0 - 35.0 PG    MCHC 31.1 31 - 37 g/dL    RDW 16.2 (H) 11.5 - 14.5 %    PLATELET 102 (L) 140 - 440 K/uL    NEUTROPHILS 78 (H) 40 - 70 %    MIXED CELLS 10 0.1 - 17 %    LYMPHOCYTES 12 (L) 14 - 44 %    ABS. NEUTROPHILS 2.8 1.8 - 9.5 K/UL    ABS. MIXED CELLS 0.4 0.0 - 2.3 K/uL    ABS. LYMPHOCYTES 0.4 (L) 1.1 - 5.9 K/UL    [facility] AUTOMATED        Hospital Outpatient Visit on 03/28/2017   Component Date Value Ref Range Status   ??? WBC 03/28/2017 3.6* 4.5 - 13.0 K/uL Final   ??? RBC 03/28/2017 3.57* 4.10 - 5.10 M/uL Final   ??? HGB 03/28/2017 10.0* 12.0 - 16.0 g/dL Final   ??? HCT 03/28/2017 32.2* 36 - 48 % Final   ??? MCV 03/28/2017 90.2  78 - 102 FL Final   ??? MCH 03/28/2017 28.0  25.0 - 35.0 PG Final   ??? MCHC 03/28/2017 31.1  31 - 37 g/dL Final   ??? RDW 03/28/2017 16.2* 11.5 - 14.5 % Final   ??? PLATELET 03/28/2017 102* 140 - 440 K/uL Final   ??? NEUTROPHILS 03/28/2017 78* 40 - 70 % Final   ??? MIXED CELLS 03/28/2017 10  0.1 - 17 % Final   ??? LYMPHOCYTES 03/28/2017 12* 14 - 44 % Final   ??? ABS. NEUTROPHILS 03/28/2017 2.8  1.8 - 9.5 K/UL Final   ??? ABS. MIXED CELLS 03/28/2017 0.4  0.0 - 2.3 K/uL Final   ??? ABS. LYMPHOCYTES 03/28/2017 0.4* 1.1 - 5.9 K/UL Final    Test performed at [place] Location. Results Reviewed by Medical Director.   ??? DF 03/28/2017 AUTOMATED    Final   Hospital Outpatient Visit on 03/07/2017   Component Date Value Ref Range Status   ??? WBC 03/07/2017 4.4* 4.5 - 13.0 K/uL Final   ??? RBC 03/07/2017 3.48* 4.10 - 5.10 M/uL Final   ??? HGB 03/07/2017 9.6* 12.0 - 16.0 g/dL Final   ??? HCT 03/07/2017 31.5* 36 - 48 % Final   ??? MCV 03/07/2017 90.5  78 - 102 FL Final   ??? MCH 03/07/2017 27.6  25.0 - 35.0 PG Final   ??? MCHC 03/07/2017 30.5* 31 - 37 g/dL Final   ??? RDW 03/07/2017 21.1* 11.5 - 14.5 % Final   ??? PLATELET 03/07/2017 102* 140 - 440 K/uL Final   ??? NEUTROPHILS 03/07/2017 77* 40 - 70 % Final   ??? MIXED CELLS 03/07/2017 11  0.1 - 17 % Final   ??? LYMPHOCYTES 03/07/2017 12* 14 - 44 % Final   ??? ABS. NEUTROPHILS 03/07/2017 3.4  1.8 - 9.5 K/UL Final   ??? ABS. MIXED CELLS 03/07/2017 0.5  0.0 - 2.3 K/uL  Final   ??? ABS. LYMPHOCYTES 03/07/2017 0.5* 1.1 - 5.9 K/UL Final    Test performed at [place] Location. Results Reviewed by Medical Director.   ??? DF 03/07/2017 AUTOMATED    Final   Hospital Outpatient Visit on 02/23/2017    Component Date Value Ref Range Status   ??? Sodium 02/23/2017 141  136 - 145 mmol/L Final   ??? Potassium 02/23/2017 4.4  3.5 - 5.5 mmol/L Final   ??? Chloride 02/23/2017 108  100 - 108 mmol/L Final   ??? CO2 02/23/2017 21  21 - 32 mmol/L Final   ??? Anion gap 02/23/2017 12  3.0 - 18 mmol/L Final   ??? Glucose 02/23/2017 101* 74 - 99 mg/dL Final   ??? BUN 02/23/2017 13  7.0 - 18 MG/DL Final   ??? Creatinine 02/23/2017 1.02  0.6 - 1.3 MG/DL Final   ??? BUN/Creatinine ratio 02/23/2017 13  12 - 20   Final   ??? GFR est AA 02/23/2017 >60  >60 ml/min/1.29m Final   ??? GFR est non-AA 02/23/2017 >60  >60 ml/min/1.713mFinal    Comment: (NOTE)  Estimated GFR is calculated using the Modification of Diet in Renal   Disease (MDRD) Study equation, reported for both African Americans   (GFRAA) and non-African Americans (GFRNA), and normalized to 1.7312m body surface area. The physician must decide which value applies to   the patient. The MDRD study equation should only be used in   individuals age 92 43 older. It has not been validated for the   following: pregnant women, patients with serious comorbid conditions,   or on certain medications, or persons with extremes of body size,   muscle mass, or nutritional status.     ??? Calcium 02/23/2017 9.5  8.5 - 10.1 MG/DL Final   ??? Bilirubin, total 02/23/2017 0.6  0.2 - 1.0 MG/DL Final   ??? ALT (SGPT) 02/23/2017 26  16 - 61 U/L Final   ??? AST (SGOT) 02/23/2017 35  15 - 37 U/L Final   ??? Alk. phosphatase 02/23/2017 200* 45 - 117 U/L Final   ??? Protein, total 02/23/2017 7.5  6.4 - 8.2 g/dL Final   ??? Albumin 02/23/2017 4.2  3.4 - 5.0 g/dL Final   ??? Globulin 02/23/2017 3.3  2.0 - 4.0 g/dL Final   ??? A-G Ratio 02/23/2017 1.3  0.8 - 1.7   Final   ??? Iron 02/23/2017 44* 50 - 175 ug/dL Final    Patients receiving metal-binding drugs (e.g. deferoxamine) may show spuriously depressed iron values, as chelated iron may not properly react in the iron assay.   ??? TIBC 02/23/2017 446  250 - 450 ug/dL Final    ??? Iron % saturation 02/23/2017 10  % Final   ??? Ferritin 02/23/2017 63  8 - 388 NG/ML Final   Hospital Outpatient Visit on 02/23/2017   Component Date Value Ref Range Status   ??? WBC 02/23/2017 3.3* 4.5 - 13.0 K/uL Final   ??? RBC 02/23/2017 3.76* 4.10 - 5.10 M/uL Final   ??? HGB 02/23/2017 10.3* 12.0 - 16.0 g/dL Final   ??? HCT 02/23/2017 34.0* 36 - 48 % Final   ??? MCV 02/23/2017 90.4  78 - 102 FL Final   ??? MCH 02/23/2017 27.4  25.0 - 35.0 PG Final   ??? MCHC 02/23/2017 30.3* 31 - 37 g/dL Final   ??? RDW 02/23/2017 22.1* 11.5 - 14.5 % Final   ??? PLATELET 02/23/2017 92* 140 - 440  K/uL Final   ??? NEUTROPHILS 02/23/2017 79* 40 - 70 % Final   ??? MIXED CELLS 02/23/2017 7  0.1 - 17 % Final   ??? LYMPHOCYTES 02/23/2017 14  14 - 44 % Final   ??? ABS. NEUTROPHILS 02/23/2017 2.6  1.8 - 9.5 K/UL Final   ??? ABS. MIXED CELLS 02/23/2017 0.2  0.0 - 2.3 K/uL Final   ??? ABS. LYMPHOCYTES 02/23/2017 0.5* 1.1 - 5.9 K/UL Final    Test performed at [place] Location. Results Reviewed by Medical Director.   ??? DF 02/23/2017 AUTOMATED    Final   Hospital Outpatient Visit on 02/17/2017   Component Date Value Ref Range Status   ??? WBC 02/17/2017 3.6* 4.5 - 13.0 K/uL Final   ??? RBC 02/17/2017 3.82* 4.10 - 5.10 M/uL Final   ??? HGB 02/17/2017 10.2* 12.0 - 16.0 g/dL Final   ??? HCT 02/17/2017 34.4* 36 - 48 % Final   ??? MCV 02/17/2017 90.1  78 - 102 FL Final   ??? MCH 02/17/2017 26.7  25.0 - 35.0 PG Final   ??? MCHC 02/17/2017 29.7* 31 - 37 g/dL Final   ??? RDW 02/17/2017 23.4* 11.5 - 14.5 % Final   ??? PLATELET 02/17/2017 96* 140 - 440 K/uL Final   ??? NEUTROPHILS 02/17/2017 76* 40 - 70 % Final   ??? MIXED CELLS 02/17/2017 10  0.1 - 17 % Final   ??? LYMPHOCYTES 02/17/2017 14  14 - 44 % Final   ??? ABS. NEUTROPHILS 02/17/2017 2.7  1.8 - 9.5 K/UL Final   ??? ABS. MIXED CELLS 02/17/2017 0.4  0.0 - 2.3 K/uL Final   ??? ABS. LYMPHOCYTES 02/17/2017 0.5* 1.1 - 5.9 K/UL Final    Test performed at [place] Location. Results Reviewed by Medical Director.    ??? DF 02/17/2017 AUTOMATED    Final   Hospital Outpatient Visit on 02/02/2017   Component Date Value Ref Range Status   ??? WBC 02/02/2017 5.0  4.5 - 13.0 K/uL Final   ??? RBC 02/02/2017 3.71* 4.10 - 5.10 M/uL Final   ??? HGB 02/02/2017 9.2* 12.0 - 16.0 g/dL Final   ??? HCT 02/02/2017 32.2* 36 - 48 % Final   ??? MCV 02/02/2017 86.8  78 - 102 FL Final   ??? MCH 02/02/2017 24.8* 25.0 - 35.0 PG Final   ??? MCHC 02/02/2017 28.6* 31 - 37 g/dL Final   ??? RDW 02/02/2017 22.7* 11.5 - 14.5 % Final   ??? PLATELET 02/02/2017 82* 140 - 440 K/uL Final   ??? NEUTROPHILS 02/02/2017 72* 40 - 70 % Final   ??? MIXED CELLS 02/02/2017 10  0.1 - 17 % Final   ??? LYMPHOCYTES 02/02/2017 18  14 - 44 % Final   ??? ABS. NEUTROPHILS 02/02/2017 3.6  1.8 - 9.5 K/UL Final   ??? ABS. MIXED CELLS 02/02/2017 0.5  0.0 - 2.3 K/uL Final   ??? ABS. LYMPHOCYTES 02/02/2017 0.9* 1.1 - 5.9 K/UL Final    Test performed at [place] Location. Results Reviewed by Medical Director.   ??? DF 02/02/2017 AUTOMATED    Final   Hospital Outpatient Visit on 01/18/2017   Component Date Value Ref Range Status   ??? WBC 01/18/2017 3.9* 4.5 - 13.0 K/uL Final   ??? RBC 01/18/2017 3.82* 4.10 - 5.10 M/uL Final   ??? HGB 01/18/2017 9.2* 12.0 - 16.0 g/dL Final   ??? HCT 01/18/2017 32.2* 36 - 48 % Final   ??? MCV 01/18/2017 84.3  78 - 102 FL Final   ??? MCH 01/18/2017 24.1* 25.0 - 35.0 PG Final   ??? MCHC 01/18/2017 28.6* 31 - 37 g/dL Final   ??? RDW 01/18/2017 21.0* 11.5 - 14.5 % Final   ??? PLATELET 01/18/2017 117* 140 - 440 K/uL Final   ??? NEUTROPHILS 01/18/2017 74* 40 - 70 % Final   ??? MIXED CELLS 01/18/2017 12  0.1 - 17 % Final   ??? LYMPHOCYTES 01/18/2017 14  14 - 44 % Final   ??? ABS. NEUTROPHILS 01/18/2017 2.8  1.8 - 9.5 K/UL Final   ??? ABS. MIXED CELLS 01/18/2017 0.5  0.0 - 2.3 K/uL Final   ??? ABS. LYMPHOCYTES 01/18/2017 0.6* 1.1 - 5.9 K/UL Final    Test performed at [place] Location. Results Reviewed by Medical Director.   ??? DF 01/18/2017 AUTOMATED    Final    Hospital Outpatient Visit on 01/04/2017   Component Date Value Ref Range Status   ??? WBC 01/04/2017 4.6  4.5 - 13.0 K/uL Final   ??? RBC 01/04/2017 3.63* 4.10 - 5.10 M/uL Final   ??? HGB 01/04/2017 8.4* 12.0 - 16.0 g/dL Final   ??? HCT 01/04/2017 30.0* 36 - 48 % Final   ??? MCV 01/04/2017 82.6  78 - 102 FL Final   ??? MCH 01/04/2017 23.1* 25.0 - 35.0 PG Final   ??? MCHC 01/04/2017 28.0* 31 - 37 g/dL Final   ??? RDW 01/04/2017 19.0* 11.5 - 14.5 % Final   ??? PLATELET 01/04/2017 75* 140 - 440 K/uL Final   ??? NEUTROPHILS 01/04/2017 76* 40 - 70 % Final   ??? MIXED CELLS 01/04/2017 12  0.1 - 17 % Final   ??? LYMPHOCYTES 01/04/2017 12* 14 - 44 % Final   ??? ABS. NEUTROPHILS 01/04/2017 3.5  1.8 - 9.5 K/UL Final   ??? ABS. MIXED CELLS 01/04/2017 0.5  0.0 - 2.3 K/uL Final   ??? ABS. LYMPHOCYTES 01/04/2017 0.6* 1.1 - 5.9 K/UL Final    Test performed at [place] Location. Results Reviewed by Medical Director.   ??? DF 01/04/2017 AUTOMATED    Final   Hospital Outpatient Visit on 12/31/2016   Component Date Value Ref Range Status   ??? Special Requests: 12/31/2016 NO SPECIAL REQUESTS    Final   ??? Culture result: 12/31/2016 NO GROWTH 2 DAYS    Final   Office Visit on 12/31/2016   Component Date Value Ref Range Status   ??? Color (UA POC) 12/31/2016 Yellow   Final   ??? Clarity (UA POC) 12/31/2016 Clear   Final   ??? Glucose (UA POC) 12/31/2016 Negative  Negative Final   ??? Bilirubin (UA POC) 12/31/2016 Negative  Negative Final   ??? Ketones (UA POC) 12/31/2016 Negative  Negative Final   ??? Specific gravity (UA POC) 12/31/2016 1.015  1.001 - 1.035 Final   ??? Blood (UA POC) 12/31/2016 Trace  Negative Final   ??? pH (UA POC) 12/31/2016 7  4.6 - 8.0 Final   ??? Protein (UA POC) 12/31/2016 Negative  Negative Final   ??? Urobilinogen (UA POC) 12/31/2016 0.2 mg/dL  0.2 - 1 Final   ??? Nitrites (UA POC) 12/31/2016 Negative  Negative Final   ??? Leukocyte esterase (UA POC) 12/31/2016 Negative  Negative Final   There may be more visits with results that are not included.        Follow-up Disposition:  Return in about 3 months (around 07/01/2017).

## 2017-04-05 ENCOUNTER — Encounter: Payer: MEDICARE | Primary: Legal Medicine

## 2017-04-11 ENCOUNTER — Inpatient Hospital Stay: Admit: 2017-04-11 | Primary: Legal Medicine

## 2017-04-11 ENCOUNTER — Institutional Professional Consult (permissible substitution): Admit: 2017-04-11 | Discharge: 2017-04-11 | Payer: PRIVATE HEALTH INSURANCE | Primary: Legal Medicine

## 2017-04-11 ENCOUNTER — Inpatient Hospital Stay: Admit: 2017-04-11 | Payer: MEDICARE | Primary: Legal Medicine

## 2017-04-11 DIAGNOSIS — D508 Other iron deficiency anemias: Secondary | ICD-10-CM

## 2017-04-11 LAB — CBC WITH 3 PART DIFF
ABS. LYMPHOCYTES: 0.5 10*3/uL — ABNORMAL LOW (ref 1.1–5.9)
ABS. MIXED CELLS: 0.3 10*3/uL (ref 0.0–2.3)
ABS. NEUTROPHILS: 2.4 10*3/uL (ref 1.8–9.5)
HCT: 29.9 % — ABNORMAL LOW (ref 36–48)
HGB: 9.2 g/dL — ABNORMAL LOW (ref 12.0–16.0)
LYMPHOCYTES: 14 % (ref 14–44)
MCH: 27.2 PG (ref 25.0–35.0)
MCHC: 30.8 g/dL — ABNORMAL LOW (ref 31–37)
MCV: 88.5 FL (ref 78–102)
Mixed cells: 10 % (ref 0.1–17)
NEUTROPHILS: 76 % — ABNORMAL HIGH (ref 40–70)
PLATELET: 93 10*3/uL — ABNORMAL LOW (ref 140–440)
RBC: 3.38 M/uL — ABNORMAL LOW (ref 4.10–5.10)
RDW: 14.7 % — ABNORMAL HIGH (ref 11.5–14.5)
WBC: 3.2 10*3/uL — ABNORMAL LOW (ref 4.5–13.0)

## 2017-04-11 MED ORDER — EPOETIN ALFA 40,000 UNIT/ML IJ SOLN
40000 unit/mL | Freq: Once | INTRAMUSCULAR | Status: AC
Start: 2017-04-11 — End: 2017-04-11
  Administered 2017-04-11: 19:00:00 via SUBCUTANEOUS

## 2017-04-11 MED ORDER — EPOETIN ALFA 20,000 UNIT/ML IJ SOLN
20000 unit/mL | Freq: Once | INTRAMUSCULAR | Status: AC
Start: 2017-04-11 — End: 2017-04-11
  Administered 2017-04-11: 19:00:00 via SUBCUTANEOUS

## 2017-04-11 MED FILL — PROCRIT 20,000 UNIT/ML INJECTION SOLUTION: 20000 unit/mL | INTRAMUSCULAR | Qty: 1

## 2017-04-11 MED FILL — PROCRIT 40,000 UNIT/ML INJECTION SOLUTION: 40000 unit/mL | INTRAMUSCULAR | Qty: 1

## 2017-04-11 NOTE — Progress Notes (Signed)
[facility] Progress Note    Date: April 11, 2017    Name: William Frerking [facility]    MRN: [id number]         DOB: Nov 10, 1945      [name] arrived in the [facility] today at 1400, in stable condition, here for Q 2 Week, CBC/Procrit injection. He was assessed and education was provided.     [name] vitals were reviewed.  Visit Vitals   ??? BP 100/57 (BP 1 Location: Right arm, BP Patient Position: Sitting)   ??? Pulse 62   ??? Temp 97.8 ??F (36.6 ??C)   ??? Resp 16           CBC was drawn from his right AC at 1420, per order, and without incident.           Lab results were obtained and reviewed.  Recent Results (from the past 12 hour(s))   CBC WITH 3 PART DIFF    Collection Time: 04/11/17  2:20 PM   Result Value Ref Range    WBC 3.2 (L) 4.5 - 13.0 K/uL    RBC 3.38 (L) 4.10 - 5.10 M/uL    HGB 9.2 (L) 12.0 - 16.0 g/dL    HCT 29.9 (L) 36 - 48 %    MCV 88.5 78 - 102 FL    MCH 27.2 25.0 - 35.0 PG    MCHC 30.8 (L) 31 - 37 g/dL    RDW 14.7 (H) 11.5 - 14.5 %    PLATELET 93 (L) 140 - 440 K/uL    NEUTROPHILS 76 (H) 40 - 70 %    MIXED CELLS 10 0.1 - 17 %    LYMPHOCYTES 14 14 - 44 %    ABS. NEUTROPHILS 2.4 1.8 - 9.5 K/UL    ABS. MIXED CELLS 0.3 0.0 - 2.3 K/uL    ABS. LYMPHOCYTES 0.5 (L) 1.1 - 5.9 K/UL    DF AUTOMATED               Procrit injection 60,000 units was administered SQ, in his left arm at 1430, per order, and without incident.          [name] tolerated well, and had no complaints.    [name] was discharged from [place] in stable condition at 1435. He is to return in 2 weeks, on  Monday, 04-25-17,  at 1500,  for his next [facility] appointment for CBC/Procrit injection.     [name], RN  April 11, 2017  4:41 PM

## 2017-04-13 NOTE — Progress Notes (Signed)
[facility] health screening:    Spoke with Ms Carrick again today and informed her I wouldn't be calling again as I know they have a lot on their plate. She is still in possession of Dr. Marguerita Beards # and she will schedule the colonoscopy as soon as possible. States "right now he is going through some ear trouble and teeth problems and as  Soon as we get that out of the way I'm going to call Dr. Truman Hayward." Will follow remotely but won't call again.

## 2017-04-15 ENCOUNTER — Ambulatory Visit
Admit: 2017-04-15 | Discharge: 2017-04-15 | Payer: PRIVATE HEALTH INSURANCE | Attending: Legal Medicine | Primary: Legal Medicine

## 2017-04-15 DIAGNOSIS — K047 Periapical abscess without sinus: Secondary | ICD-10-CM

## 2017-04-15 MED ORDER — CLINDAMYCIN 300 MG CAP
300 mg | ORAL_CAPSULE | Freq: Three times a day (TID) | ORAL | 0 refills | Status: DC
Start: 2017-04-15 — End: 2017-04-25

## 2017-04-15 MED ORDER — FINASTERIDE 5 MG TAB
5 mg | ORAL_TABLET | Freq: Every day | ORAL | 2 refills | Status: DC
Start: 2017-04-15 — End: 2017-07-14

## 2017-04-15 MED ORDER — BUTALBITAL-ACETAMINOPHEN-CAFFEINE 50 MG-300 MG-40 MG CAPSULE
50-300-40 mg | ORAL_CAPSULE | Freq: Four times a day (QID) | ORAL | 1 refills | Status: DC | PRN
Start: 2017-04-15 — End: 2017-04-28

## 2017-04-15 NOTE — Progress Notes (Signed)
William Blalock Alaniz Sr.   No chief complaint on file.    Vitals:    04/15/17 1013   BP: 108/59   Pulse: 65   Resp: 18   Temp: 96 ??F (35.6 ??C)   TempSrc: Oral   SpO2: 100%   Weight: 131 lb (59.4 kg)   Height: '5\' 9"'$  (1.753 m)   PainSc:   4   PainLoc: Ear         HPI: Patient is here with his wife for left ear pain for over few days he says the pain is, severe and preventing him from sleep, has has no fever no chills, no headache no nausea vomiting the pain also radiates to the side of the left jaw, no ear discharge, patient had decreased hearing already and he is wearing hearing aid.    Patient also need medication refill today      The patient does have a chronic abdominal pain CT scan was ordered and not done yet!!    Copy of the order will be given to the patient and faxed again to [facility].  I told the patient and wife that to make sure she call the office if she is having any difficulties getting the CT scan scheduled.      Past Medical History:   Diagnosis Date   ??? Anemia    ??? Anxiety    ??? Chronic pain    ??? Cirrhosis of liver ([place])    ??? Diabetes ([place])    ??? GERD (gastroesophageal reflux disease)    ??? Hyperlipemia    ??? Hypertension    ??? Hypotension    ??? Migraine    ??? Other cirrhosis of liver ([place])    ??? Stroke [facility])     had 3 strokes    ??? Thrombocytopenia ([place])      Past Surgical History:   Procedure Laterality Date   ??? HX CHOLECYSTECTOMY     ??? HX ORTHOPAEDIC      R hand sx   ??? HX OTHER SURGICAL      Hand surgery- Nail gun went through finger     Social History     Tobacco Use   ??? Smoking status: Former Smoker   ??? Smokeless tobacco: Never Used   ??? Tobacco comment: quit years ago   Substance Use Topics   ??? Alcohol use: No       Family History   Problem Relation Age of Onset   ??? Alzheimer Mother    ??? Diabetes Mother    ??? Cancer Mother         colon cancer    ??? Hypertension Father    ??? Heart Disease Father    ??? Cancer Father         prostate, lung cancer    ??? Diabetes Sister    ??? Heart Disease Sister     ??? Diabetes Brother        Review of Systems   Constitutional: Negative for chills, fever, malaise/fatigue and weight loss.   HENT: Positive for ear pain and hearing loss. Negative for congestion, ear discharge, nosebleeds and sore throat.    Eyes: Negative for blurred vision, double vision and discharge.   Respiratory: Negative for cough, shortness of breath and wheezing.    Cardiovascular: Negative for chest pain, palpitations, claudication and leg swelling.   Gastrointestinal: Positive for abdominal pain and nausea. Negative for constipation, diarrhea and vomiting.   Genitourinary: Positive for urgency. Negative for dysuria,  frequency and hematuria.   Musculoskeletal: Negative for myalgias.   Skin: Negative for itching and rash.   Neurological: Negative for dizziness, tingling, sensory change, speech change, focal weakness, weakness and headaches.       Physical Exam   Constitutional: He is oriented to person, place, and time. He appears well-developed and well-nourished. No distress.   HENT:   Head: Normocephalic and atraumatic.   Right Ear: External ear normal.   Left Ear: External ear normal.   Mouth/Throat: No oropharyngeal exudate.   Bilateral ear examination is normal        Patient has decayed tooth on the left lower jaw, tender to touch with surrounding irritation and infection, no abscess formation,   Eyes: Conjunctivae are normal. Pupils are equal, round, and reactive to light. Right eye exhibits no discharge. Left eye exhibits no discharge. No scleral icterus.   Neck: Normal range of motion. Neck supple. No thyromegaly present.   In the left side there is a submandibular tender lymph node   Cardiovascular: Normal rate, regular rhythm and normal heart sounds.   Pulmonary/Chest: Effort normal and breath sounds normal. No respiratory distress. He has no wheezes. He has no rales. He exhibits no tenderness.   Musculoskeletal: Normal range of motion. He exhibits no edema, tenderness or deformity.    Lymphadenopathy:     He has cervical adenopathy.   Neurological: He is alert and oriented to person, place, and time. No cranial nerve deficit. Coordination normal.   Skin: Skin is warm and dry. No rash noted. He is not diaphoretic. No erythema.   Psychiatric: He has a normal mood and affect. His behavior is normal. Judgment and thought content normal.        Assessment and plan     1. Benign prostatic hyperplasia (BPH) with straining on urination    Stable on current  medications  - finasteride (PROSCAR) 5 mg tablet; Take 1 Tab by mouth daily.  Dispense: 30 Tab; Refill: 2    2. Migraine without aura and without status migrainosus, not intractable    Patient takes Fioricet as needed headaches.    - butalbital-acetaminophen-caff (FIORICET) 50-300-40 mg per capsule; Take 1 Cap by mouth every six (6) hours as needed for Pain.  Dispense: 60 Cap; Refill: 1    3. Infected tooth    Patient left ear pain is due to a decayed tooth in the left lower jaw, patient did see a dentist and was referred to oral surgeon, antibiotic will be given and then the patient need to see the oral surgeon for most likely tooth extraction.    - clindamycin (CLEOCIN) 300 mg capsule; Take 1 Cap by mouth three (3) times daily for 10 days.  Dispense: 30 Cap; Refill: 0    Current Outpatient Medications   Medication Sig Dispense Refill   ??? esomeprazole (NEXIUM) 20 mg capsule Take 20 mg by mouth two (2) times a day.     ??? butalbital-acetaminophen-caff (FIORICET) 50-300-40 mg per capsule Take 1 Cap by mouth every six (6) hours as needed for Pain. 60 Cap 1   ??? finasteride (PROSCAR) 5 mg tablet Take 1 Tab by mouth daily. 30 Tab 2   ??? clindamycin (CLEOCIN) 300 mg capsule Take 1 Cap by mouth three (3) times daily for 10 days. 30 Cap 0   ??? HUMULIN R REGULAR U-100 INSULN 100 unit/mL injection As needed per sliding scale 1 Vial 6   ??? Insulin Syringe-Needle U-100 1 mL 29 gauge x 1/2"  syrg Three times per day and as needed as per sliding scale 100 Syringe 6    ??? LORazepam (ATIVAN) 1 mg tablet Take 1 Tab by mouth every eight (8) hours as needed for Anxiety for up to 90 days. Max Daily Amount: 3 mg. 90 Tab 0   ??? sucralfate (CARAFATE) 1 gram tablet TAKE 1 TABLET BY MOUTH FOUR TIMES DAILY WITH MEALS AND AT BEDTIME 120 Tab 0   ??? propranolol (INDERAL) 20 mg tablet Take 1 Tab by mouth three (3) times daily. (Patient taking differently: Take 20 mg by mouth two (2) times a day.) 90 Tab 2   ??? metFORMIN (GLUCOPHAGE) 500 mg tablet TAKE 2 TABLETS BY MOUTH TWICE DAILY WITH MEALS(GENERIC FOR GLUCOPHAGE) 360 Tab 0   ??? prochlorperazine (COMPAZINE) 10 mg tablet Take 0.5 Tabs by mouth every six (6) hours as needed. 30 Tab 5   ??? gemfibrozil (LOPID) 600 mg tablet TAKE 1 TABLET BY MOUTH TWICE DAILY 180 Tab 3   ??? ketoconazole (NIZORAL) 2 % topical cream Apply  to affected area daily. 15 g 0   ??? augmented betamethasone dipropionate (DIPROLENE-AF) 0.05 % ointment Apply  to affected area two (2) times a day.     ??? naloxone (NARCAN) 4 mg/actuation nasal spray Use 1 spray intranasally into 1 nostril. Use a new Narcan nasal spray for subsequent doses and administer into alternating nostrils. May repeat every 2 to 3 minutes as needed for opioid overdose symptoms. 1 Each 0   ??? oxyCODONE IR (ROXICODONE) 20 mg immediate release tablet TK 1 T PO Q 6 HOURS  0   ??? aspirin 81 mg chewable tablet Take 81 mg by mouth daily.     ??? cholecalciferol, vitamin D3, (VITAMIN D3) 2,000 unit tab Take  by mouth.     ??? B.infantis-B.ani-B.long-B.bifi (PROBIOTIC 4X) 10-15 mg TbEC Take  by mouth.     ??? topiramate (TOPAMAX) 100 mg tablet Take 1 Tab by mouth daily. 90 Tab 3   ??? citalopram (CELEXA) 40 mg tablet Take 1 Tab by mouth daily. 90 Tab 3   ??? lactulose (CHRONULAC) 10 gram/15 mL solution Take  by mouth three (3) times daily.     ??? cyclobenzaprine (FLEXERIL) 10 mg tablet Take  by mouth three (3) times daily as needed for Muscle Spasm(s).     ??? metaxalone (SKELAXIN) 800 mg tablet Take 800 mg by mouth three (3) times  daily as needed.         Patient Active Problem List    Diagnosis Date Noted   ??? Migraine without aura and without status migrainosus, not intractable 04/15/2017   ??? 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 Dermatitis 01/05/2017   ??? Benign prostatic hyperplasia (BPH) with straining on urination 12/31/2016   ??? 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 Myelodysplastic syndrome ([place]) 12/22/2016   ??? Type 2 diabetes with nephropathy ([place]) 10/18/2016   ??? Iron deficiency anemia secondary to inadequate dietary iron intake 09/23/2016   ??? Chronic leukopenia 09/23/2016   ??? Thrombocytopenia ([place]) 09/23/2016   ??? 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 Type II diabetes mellitus ([place]) 08/03/2016   ??? HTN (hypertension) 08/03/2016   ??? 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 Stroke ([place]) 08/03/2016   ??? Compressed vertebrae ([place]) 08/03/2016   ??? Chronic back pain greater than 3 months duration 08/03/2016   ??? Hyperlipemia 08/03/2016   ??? Anxiety 08/03/2016   ??? Depression 06/30/2016   ??? Iron deficiency  anemia 04/22/2016   ??? NAFLD (nonalcoholic fatty liver disease) 04/22/2016   ??? Other cirrhosis of liver ([place]) 04/22/2016   ??? Chronic anemia 03/18/2016   ??? Controlled type 2 diabetes mellitus without complication, without long-term current use of insulin ([place]) 03/18/2016   ??? Essential hypertension 03/18/2016   ??? Hyperlipidemia 03/18/2016   ??? History of stroke 03/18/2016   ??? Anxiety 03/18/2016   ??? Chronic pain 03/18/2016     Results for orders placed or performed during the hospital encounter of 04/11/17   CBC WITH 3 PART DIFF   Result Value Ref Range    WBC 3.2 (L) 4.5 - 13.0 K/uL    RBC 3.38 (L) 4.10 - 5.10 M/uL    HGB 9.2 (L) 12.0 - 16.0 g/dL    HCT 29.9 (L) 36 - 48 %    MCV 88.5 78 - 102 FL    MCH 27.2 25.0 - 35.0 PG    MCHC 30.8 (L) 31 - 37 g/dL    RDW 14.7 (H) 11.5 - 14.5 %    PLATELET 93 (L) 140 - 440 K/uL    NEUTROPHILS 76 (H) 40 - 70 %    MIXED CELLS 10 0.1 - 17 %    LYMPHOCYTES 14 14 - 44 %     ABS. NEUTROPHILS 2.4 1.8 - 9.5 K/UL    ABS. MIXED CELLS 0.3 0.0 - 2.3 K/uL    ABS. LYMPHOCYTES 0.5 (L) 1.1 - 5.9 K/UL    [facility] AUTOMATED       Hospital Outpatient Visit on 04/11/2017   Component Date Value Ref Range Status   ??? WBC 04/11/2017 3.2* 4.5 - 13.0 K/uL Final   ??? RBC 04/11/2017 3.38* 4.10 - 5.10 M/uL Final   ??? HGB 04/11/2017 9.2* 12.0 - 16.0 g/dL Final   ??? HCT 04/11/2017 29.9* 36 - 48 % Final   ??? MCV 04/11/2017 88.5  78 - 102 FL Final   ??? MCH 04/11/2017 27.2  25.0 - 35.0 PG Final   ??? MCHC 04/11/2017 30.8* 31 - 37 g/dL Final   ??? RDW 04/11/2017 14.7* 11.5 - 14.5 % Final   ??? PLATELET 04/11/2017 93* 140 - 440 K/uL Final   ??? NEUTROPHILS 04/11/2017 76* 40 - 70 % Final   ??? MIXED CELLS 04/11/2017 10  0.1 - 17 % Final   ??? LYMPHOCYTES 04/11/2017 14  14 - 44 % Final   ??? ABS. NEUTROPHILS 04/11/2017 2.4  1.8 - 9.5 K/UL Final   ??? ABS. MIXED CELLS 04/11/2017 0.3  0.0 - 2.3 K/uL Final   ??? ABS. LYMPHOCYTES 04/11/2017 0.5* 1.1 - 5.9 K/UL Final    Test performed at [place] Location. Results Reviewed by Medical Director.   ??? DF 04/11/2017 AUTOMATED    Final   Hospital Outpatient Visit on 03/28/2017   Component Date Value Ref Range Status   ??? WBC 03/28/2017 3.6* 4.5 - 13.0 K/uL Final   ??? RBC 03/28/2017 3.57* 4.10 - 5.10 M/uL Final   ??? HGB 03/28/2017 10.0* 12.0 - 16.0 g/dL Final   ??? HCT 03/28/2017 32.2* 36 - 48 % Final   ??? MCV 03/28/2017 90.2  78 - 102 FL Final   ??? MCH 03/28/2017 28.0  25.0 - 35.0 PG Final   ??? MCHC 03/28/2017 31.1  31 - 37 g/dL Final   ??? RDW 03/28/2017 16.2* 11.5 - 14.5 % Final   ??? PLATELET 03/28/2017 102* 140 - 440 K/uL Final   ??? NEUTROPHILS 03/28/2017 78*  40 - 70 % Final   ??? MIXED CELLS 03/28/2017 10  0.1 - 17 % Final   ??? LYMPHOCYTES 03/28/2017 12* 14 - 44 % Final   ??? ABS. NEUTROPHILS 03/28/2017 2.8  1.8 - 9.5 K/UL Final   ??? ABS. MIXED CELLS 03/28/2017 0.4  0.0 - 2.3 K/uL Final   ??? ABS. LYMPHOCYTES 03/28/2017 0.4* 1.1 - 5.9 K/UL Final     Test performed at [place] Location. Results Reviewed by Medical Director.   ??? DF 03/28/2017 AUTOMATED    Final   Hospital Outpatient Visit on 03/07/2017   Component Date Value Ref Range Status   ??? WBC 03/07/2017 4.4* 4.5 - 13.0 K/uL Final   ??? RBC 03/07/2017 3.48* 4.10 - 5.10 M/uL Final   ??? HGB 03/07/2017 9.6* 12.0 - 16.0 g/dL Final   ??? HCT 03/07/2017 31.5* 36 - 48 % Final   ??? MCV 03/07/2017 90.5  78 - 102 FL Final   ??? MCH 03/07/2017 27.6  25.0 - 35.0 PG Final   ??? MCHC 03/07/2017 30.5* 31 - 37 g/dL Final   ??? RDW 03/07/2017 21.1* 11.5 - 14.5 % Final   ??? PLATELET 03/07/2017 102* 140 - 440 K/uL Final   ??? NEUTROPHILS 03/07/2017 77* 40 - 70 % Final   ??? MIXED CELLS 03/07/2017 11  0.1 - 17 % Final   ??? LYMPHOCYTES 03/07/2017 12* 14 - 44 % Final   ??? ABS. NEUTROPHILS 03/07/2017 3.4  1.8 - 9.5 K/UL Final   ??? ABS. MIXED CELLS 03/07/2017 0.5  0.0 - 2.3 K/uL Final   ??? ABS. LYMPHOCYTES 03/07/2017 0.5* 1.1 - 5.9 K/UL Final    Test performed at [place] Location. Results Reviewed by Medical Director.   ??? DF 03/07/2017 AUTOMATED    Final   Hospital Outpatient Visit on 02/23/2017   Component Date Value Ref Range Status   ??? Sodium 02/23/2017 141  136 - 145 mmol/L Final   ??? Potassium 02/23/2017 4.4  3.5 - 5.5 mmol/L Final   ??? Chloride 02/23/2017 108  100 - 108 mmol/L Final   ??? CO2 02/23/2017 21  21 - 32 mmol/L Final   ??? Anion gap 02/23/2017 12  3.0 - 18 mmol/L Final   ??? Glucose 02/23/2017 101* 74 - 99 mg/dL Final   ??? BUN 02/23/2017 13  7.0 - 18 MG/DL Final   ??? Creatinine 02/23/2017 1.02  0.6 - 1.3 MG/DL Final   ??? BUN/Creatinine ratio 02/23/2017 13  12 - 20   Final   ??? GFR est AA 02/23/2017 >60  >60 ml/min/1.41m Final   ??? GFR est non-AA 02/23/2017 >60  >60 ml/min/1.713mFinal    Comment: (NOTE)  Estimated GFR is calculated using the Modification of Diet in Renal   Disease (MDRD) Study equation, reported for both African Americans   (GFRAA) and non-African Americans (GFRNA), and normalized to 1.7364m  body surface area. The physician must decide which value applies to   the patient. The MDRD study equation should only be used in   individuals age 77 67 older. It has not been validated for the   following: pregnant women, patients with serious comorbid conditions,   or on certain medications, or persons with extremes of body size,   muscle mass, or nutritional status.     ??? Calcium 02/23/2017 9.5  8.5 - 10.1 MG/DL Final   ??? Bilirubin, total 02/23/2017 0.6  0.2 - 1.0 MG/DL Final   ??? ALT (SGPT)  02/23/2017 26  16 - 61 U/L Final   ??? AST (SGOT) 02/23/2017 35  15 - 37 U/L Final   ??? Alk. phosphatase 02/23/2017 200* 45 - 117 U/L Final   ??? Protein, total 02/23/2017 7.5  6.4 - 8.2 g/dL Final   ??? Albumin 02/23/2017 4.2  3.4 - 5.0 g/dL Final   ??? Globulin 02/23/2017 3.3  2.0 - 4.0 g/dL Final   ??? A-G Ratio 02/23/2017 1.3  0.8 - 1.7   Final   ??? Iron 02/23/2017 44* 50 - 175 ug/dL Final    Patients receiving metal-binding drugs (e.g. deferoxamine) may show spuriously depressed iron values, as chelated iron may not properly react in the iron assay.   ??? TIBC 02/23/2017 446  250 - 450 ug/dL Final   ??? Iron % saturation 02/23/2017 10  % Final   ??? Ferritin 02/23/2017 63  8 - 388 NG/ML Final   Hospital Outpatient Visit on 02/23/2017   Component Date Value Ref Range Status   ??? WBC 02/23/2017 3.3* 4.5 - 13.0 K/uL Final   ??? RBC 02/23/2017 3.76* 4.10 - 5.10 M/uL Final   ??? HGB 02/23/2017 10.3* 12.0 - 16.0 g/dL Final   ??? HCT 02/23/2017 34.0* 36 - 48 % Final   ??? MCV 02/23/2017 90.4  78 - 102 FL Final   ??? MCH 02/23/2017 27.4  25.0 - 35.0 PG Final   ??? MCHC 02/23/2017 30.3* 31 - 37 g/dL Final   ??? RDW 02/23/2017 22.1* 11.5 - 14.5 % Final   ??? PLATELET 02/23/2017 92* 140 - 440 K/uL Final   ??? NEUTROPHILS 02/23/2017 79* 40 - 70 % Final   ??? MIXED CELLS 02/23/2017 7  0.1 - 17 % Final   ??? LYMPHOCYTES 02/23/2017 14  14 - 44 % Final   ??? ABS. NEUTROPHILS 02/23/2017 2.6  1.8 - 9.5 K/UL Final   ??? ABS. MIXED CELLS 02/23/2017 0.2  0.0 - 2.3 K/uL Final    ??? ABS. LYMPHOCYTES 02/23/2017 0.5* 1.1 - 5.9 K/UL Final    Test performed at [place] Location. Results Reviewed by Medical Director.   ??? DF 02/23/2017 AUTOMATED    Final   Hospital Outpatient Visit on 02/17/2017   Component Date Value Ref Range Status   ??? WBC 02/17/2017 3.6* 4.5 - 13.0 K/uL Final   ??? RBC 02/17/2017 3.82* 4.10 - 5.10 M/uL Final   ??? HGB 02/17/2017 10.2* 12.0 - 16.0 g/dL Final   ??? HCT 02/17/2017 34.4* 36 - 48 % Final   ??? MCV 02/17/2017 90.1  78 - 102 FL Final   ??? MCH 02/17/2017 26.7  25.0 - 35.0 PG Final   ??? MCHC 02/17/2017 29.7* 31 - 37 g/dL Final   ??? RDW 02/17/2017 23.4* 11.5 - 14.5 % Final   ??? PLATELET 02/17/2017 96* 140 - 440 K/uL Final   ??? NEUTROPHILS 02/17/2017 76* 40 - 70 % Final   ??? MIXED CELLS 02/17/2017 10  0.1 - 17 % Final   ??? LYMPHOCYTES 02/17/2017 14  14 - 44 % Final   ??? ABS. NEUTROPHILS 02/17/2017 2.7  1.8 - 9.5 K/UL Final   ??? ABS. MIXED CELLS 02/17/2017 0.4  0.0 - 2.3 K/uL Final   ??? ABS. LYMPHOCYTES 02/17/2017 0.5* 1.1 - 5.9 K/UL Final    Test performed at [place] Location. Results Reviewed by Medical Director.   ??? DF 02/17/2017 AUTOMATED    Final   Hospital Outpatient Visit on 02/02/2017   Component Date  Value Ref Range Status   ??? WBC 02/02/2017 5.0  4.5 - 13.0 K/uL Final   ??? RBC 02/02/2017 3.71* 4.10 - 5.10 M/uL Final   ??? HGB 02/02/2017 9.2* 12.0 - 16.0 g/dL Final   ??? HCT 02/02/2017 32.2* 36 - 48 % Final   ??? MCV 02/02/2017 86.8  78 - 102 FL Final   ??? MCH 02/02/2017 24.8* 25.0 - 35.0 PG Final   ??? MCHC 02/02/2017 28.6* 31 - 37 g/dL Final   ??? RDW 02/02/2017 22.7* 11.5 - 14.5 % Final   ??? PLATELET 02/02/2017 82* 140 - 440 K/uL Final   ??? NEUTROPHILS 02/02/2017 72* 40 - 70 % Final   ??? MIXED CELLS 02/02/2017 10  0.1 - 17 % Final   ??? LYMPHOCYTES 02/02/2017 18  14 - 44 % Final   ??? ABS. NEUTROPHILS 02/02/2017 3.6  1.8 - 9.5 K/UL Final   ??? ABS. MIXED CELLS 02/02/2017 0.5  0.0 - 2.3 K/uL Final   ??? ABS. LYMPHOCYTES 02/02/2017 0.9* 1.1 - 5.9 K/UL Final     Test performed at [place] Location. Results Reviewed by Medical Director.   ??? DF 02/02/2017 AUTOMATED    Final   Hospital Outpatient Visit on 01/18/2017   Component Date Value Ref Range Status   ??? WBC 01/18/2017 3.9* 4.5 - 13.0 K/uL Final   ??? RBC 01/18/2017 3.82* 4.10 - 5.10 M/uL Final   ??? HGB 01/18/2017 9.2* 12.0 - 16.0 g/dL Final   ??? HCT 01/18/2017 32.2* 36 - 48 % Final   ??? MCV 01/18/2017 84.3  78 - 102 FL Final   ??? MCH 01/18/2017 24.1* 25.0 - 35.0 PG Final   ??? MCHC 01/18/2017 28.6* 31 - 37 g/dL Final   ??? RDW 01/18/2017 21.0* 11.5 - 14.5 % Final   ??? PLATELET 01/18/2017 117* 140 - 440 K/uL Final   ??? NEUTROPHILS 01/18/2017 74* 40 - 70 % Final   ??? MIXED CELLS 01/18/2017 12  0.1 - 17 % Final   ??? LYMPHOCYTES 01/18/2017 14  14 - 44 % Final   ??? ABS. NEUTROPHILS 01/18/2017 2.8  1.8 - 9.5 K/UL Final   ??? ABS. MIXED CELLS 01/18/2017 0.5  0.0 - 2.3 K/uL Final   ??? ABS. LYMPHOCYTES 01/18/2017 0.6* 1.1 - 5.9 K/UL Final    Test performed at [place] Location. Results Reviewed by Medical Director.   ??? DF 01/18/2017 AUTOMATED    Final          Follow-up Disposition:  Return if symptoms worsen or fail to improve.

## 2017-04-15 NOTE — Progress Notes (Signed)
Verl Blalock Lyttle Sr. is a 71 y.o. male (DOB: 1945-08-08) presenting to address:    No chief complaint on file.      Vitals:    04/15/17 1013   BP: 108/59   Pulse: 65   Resp: 18   Temp: 96 ??F (35.6 ??C)   TempSrc: Oral   SpO2: 100%   Weight: 131 lb (59.4 kg)   Height: 5\' 9"  (1.753 m)   PainSc:   4   PainLoc: Ear       Hearing/Vision:   No exam data present    Learning Assessment:     Learning Assessment 08/02/2016   PRIMARY LEARNER Patient   HIGHEST LEVEL OF EDUCATION - PRIMARY LEARNER  -   BARRIERS PRIMARY LEARNER -   CO-LEARNER CAREGIVER -   PRIMARY LANGUAGE ENGLISH   LEARNER PREFERENCE PRIMARY DEMONSTRATION     READING   ANSWERED BY Patient   RELATIONSHIP SELF     Depression Screening:     PHQ over the last two weeks 10/22/2016   Little interest or pleasure in doing things Not at all   Feeling down, depressed, irritable, or hopeless Not at all   Total Score PHQ 2 0     Fall Risk Assessment:     Fall Risk Assessment, last 12 mths 04/15/2017   Able to walk? Yes   Fall in past 12 months? No   Fall with injury? -   Number of falls in past 12 months -   Fall Risk Score -     Abuse Screening:     Abuse Screening Questionnaire 03/31/2017   Do you ever feel afraid of your partner? N   Are you in a relationship with someone who physically or mentally threatens you? N   Is it safe for you to go home? Y     Coordination of Care Questionaire:   1. Have you been to the ER, urgent care clinic since your last visit?  Hospitalized since your last visit? NO    2. Have you seen or consulted any other health care providers outside of the [facility] since your last visit?  Include any pap smears or colon screening. NO

## 2017-04-19 ENCOUNTER — Inpatient Hospital Stay: Payer: MEDICARE | Primary: Legal Medicine

## 2017-04-25 ENCOUNTER — Inpatient Hospital Stay: Admit: 2017-04-25 | Payer: MEDICARE | Primary: Legal Medicine

## 2017-04-25 ENCOUNTER — Institutional Professional Consult (permissible substitution): Admit: 2017-04-25 | Discharge: 2017-04-25 | Payer: PRIVATE HEALTH INSURANCE | Primary: Legal Medicine

## 2017-04-25 ENCOUNTER — Inpatient Hospital Stay: Admit: 2017-04-25 | Primary: Legal Medicine

## 2017-04-25 DIAGNOSIS — D508 Other iron deficiency anemias: Secondary | ICD-10-CM

## 2017-04-25 LAB — CBC WITH 3 PART DIFF
ABS. LYMPHOCYTES: 0.4 10*3/uL — ABNORMAL LOW (ref 1.1–5.9)
ABS. MIXED CELLS: 0.3 10*3/uL (ref 0.0–2.3)
ABS. NEUTROPHILS: 2.5 10*3/uL (ref 1.8–9.5)
HCT: 29.7 % — ABNORMAL LOW (ref 36–48)
HGB: 8.9 g/dL — ABNORMAL LOW (ref 12.0–16.0)
LYMPHOCYTES: 13 % — ABNORMAL LOW (ref 14–44)
MCH: 25.6 PG (ref 25.0–35.0)
MCHC: 30 g/dL — ABNORMAL LOW (ref 31–37)
MCV: 85.3 FL (ref 78–102)
Mixed cells: 10 % (ref 0.1–17)
NEUTROPHILS: 77 % — ABNORMAL HIGH (ref 40–70)
PLATELET: 75 10*3/uL — ABNORMAL LOW (ref 140–440)
RBC: 3.48 M/uL — ABNORMAL LOW (ref 4.10–5.10)
RDW: 14.5 % (ref 11.5–14.5)
WBC: 3.2 10*3/uL — ABNORMAL LOW (ref 4.5–13.0)

## 2017-04-25 MED ORDER — EPOETIN ALFA 20,000 UNIT/ML IJ SOLN
20000 unit/mL | Freq: Once | INTRAMUSCULAR | Status: AC
Start: 2017-04-25 — End: 2017-04-25
  Administered 2017-04-25: 19:00:00 via SUBCUTANEOUS

## 2017-04-25 MED ORDER — EPOETIN ALFA 40,000 UNIT/ML IJ SOLN
40000 unit/mL | Freq: Once | INTRAMUSCULAR | Status: AC
Start: 2017-04-25 — End: 2017-04-25
  Administered 2017-04-25: 19:00:00 via SUBCUTANEOUS

## 2017-04-25 MED FILL — PROCRIT 20,000 UNIT/ML INJECTION SOLUTION: 20000 unit/mL | INTRAMUSCULAR | Qty: 1

## 2017-04-25 MED FILL — PROCRIT 40,000 UNIT/ML INJECTION SOLUTION: 40000 unit/mL | INTRAMUSCULAR | Qty: 1

## 2017-04-25 NOTE — Telephone Encounter (Signed)
Pt's wife called stating that insurance will not cover the Fioricet in capsule form and would like to know if a prescription for the tablets can be called into pharmacy on file.     Please advise.

## 2017-04-25 NOTE — Progress Notes (Signed)
[facility] Progress Note    Date: April 25, 2017    Name: Jovi Zavadil [facility] Sr.    MRN: [id number]         DOB: 1946/02/23      Mr. Lightner was assessed and education was provided.     Mr. Degante vitals were reviewed and patient was observed for 5 minutes prior to treatment.   Visit Vitals  BP 106/63 (BP 1 Location: Right arm, BP Patient Position: At rest;Sitting)   Pulse 63   Temp 98.2 ??F (36.8 ??C)   Resp 16       Lab results were obtained and reviewed.  Recent Results (from the past 12 hour(s))   CBC WITH 3 PART DIFF    Collection Time: 04/25/17  3:05 PM   Result Value Ref Range    WBC 3.2 (L) 4.5 - 13.0 K/uL    RBC 3.48 (L) 4.10 - 5.10 M/uL    HGB 8.9 (L) 12.0 - 16.0 g/dL    HCT 29.7 (L) 36 - 48 %    MCV 85.3 78 - 102 FL    MCH 25.6 25.0 - 35.0 PG    MCHC 30.0 (L) 31 - 37 g/dL    RDW 14.5 11.5 - 14.5 %    PLATELET 75 (L) 140 - 440 K/uL    NEUTROPHILS 77 (H) 40 - 70 %    MIXED CELLS 10 0.1 - 17 %    LYMPHOCYTES 13 (L) 14 - 44 %    ABS. NEUTROPHILS 2.5 1.8 - 9.5 K/UL    ABS. MIXED CELLS 0.3 0.0 - 2.3 K/uL    ABS. LYMPHOCYTES 0.4 (L) 1.1 - 5.9 K/UL    DF AUTOMATED         Procrit 60,000 units SQ given for H&H 8.9/29.7.    Patient armband removed and shredded.    Mr. Aragones was discharged from [place] in stable condition at 1520. He is to return on 05/09/17 at 1500 for his next appointment for CBC/Procrit Q 2 wks.    Lynnda Shields, RN  April 25, 2017  3:48 PM

## 2017-04-25 NOTE — Telephone Encounter (Signed)
Dr. Phineas Douglas,     Please see request below.

## 2017-04-28 ENCOUNTER — Ambulatory Visit
Admit: 2017-04-28 | Discharge: 2017-04-28 | Payer: PRIVATE HEALTH INSURANCE | Attending: Hematology & Oncology | Primary: Legal Medicine

## 2017-04-28 ENCOUNTER — Inpatient Hospital Stay: Admit: 2017-04-28 | Payer: MEDICARE | Primary: Legal Medicine

## 2017-04-28 ENCOUNTER — Inpatient Hospital Stay: Admit: 2017-04-28 | Primary: Legal Medicine

## 2017-04-28 DIAGNOSIS — D508 Other iron deficiency anemias: Secondary | ICD-10-CM

## 2017-04-28 LAB — CBC WITH 3 PART DIFF
ABS. LYMPHOCYTES: 0.5 10*3/uL — ABNORMAL LOW (ref 1.1–5.9)
ABS. MIXED CELLS: 0.3 10*3/uL (ref 0.0–2.3)
ABS. NEUTROPHILS: 2.4 10*3/uL (ref 1.8–9.5)
HCT: 32.5 % — ABNORMAL LOW (ref 36–48)
HGB: 9.8 g/dL — ABNORMAL LOW (ref 12.0–16.0)
LYMPHOCYTES: 16 % (ref 14–44)
MCH: 25.5 PG (ref 25.0–35.0)
MCHC: 30.2 g/dL — ABNORMAL LOW (ref 31–37)
MCV: 84.6 FL (ref 78–102)
Mixed cells: 8 % (ref 0.1–17)
NEUTROPHILS: 76 % — ABNORMAL HIGH (ref 40–70)
PLATELET: 80 10*3/uL — ABNORMAL LOW (ref 140–440)
RBC: 3.84 M/uL — ABNORMAL LOW (ref 4.10–5.10)
RDW: 14.9 % — ABNORMAL HIGH (ref 11.5–14.5)
WBC: 3.2 10*3/uL — ABNORMAL LOW (ref 4.5–13.0)

## 2017-04-28 MED ORDER — BUTALBITAL-ACETAMINOPHEN-CAFFEINE 50 MG-325 MG-40 MG TAB
50-325-40 mg | ORAL_TABLET | Freq: Four times a day (QID) | ORAL | 1 refills | Status: DC | PRN
Start: 2017-04-28 — End: 2018-05-16

## 2017-04-28 NOTE — Telephone Encounter (Signed)
Patients wife has been called with no answer. [place].     Rx has been sent to patients pharmacy.

## 2017-04-28 NOTE — Progress Notes (Signed)
Hematology/Oncology  Progress Note  Name: William Brisco Sr.  Date: 04/28/2017  DOB: May 27, 1946    PCP: [name], MD     William Blanchard is a 71 y.o.-year-old man who has myelodysplastic syndrome, chronic leukopenia, and severe anemia.  Current therapy: Procrit 60,000 units every 2 weeks when the hemoglobin is below 10 g/dL.    Subjective:     William Blanchard is a 71 year old man who has myelodysplastic syndrome with severe anemia.  He also has chronic leukopenia and thrombocytopenia.  He is currently receiving Procrit 60,000 units every 2 weeks.  Today he is complaining of some fatigue and weakness.  His wife reports that although he is eating well he is continuing to lose weight and he is hypersomnolent, sleeping all of the time.  There are no new issues.    Past medical history, family history, and social history: these were reviewed and remains unchanged.    Past Medical History:   Diagnosis Date   ??? Anemia    ??? Anxiety    ??? Chronic pain    ??? Cirrhosis of liver ([place])    ??? Diabetes ([place])    ??? GERD (gastroesophageal reflux disease)    ??? Hyperlipemia    ??? Hypertension    ??? Hypotension    ??? Migraine    ??? Other cirrhosis of liver ([place])    ??? Stroke [facility])     had 3 strokes    ??? Thrombocytopenia ([place])      Past Surgical History:   Procedure Laterality Date   ??? HX CHOLECYSTECTOMY     ??? HX ORTHOPAEDIC      R hand sx   ??? HX OTHER SURGICAL      Hand surgery- Nail gun went through finger     Social History     Socioeconomic History   ??? Marital status: MARRIED     Spouse name: Not on file   ??? Number of children: Not on file   ??? Years of education: Not on file   ??? Highest education level: Not on file   Social Needs   ??? Financial resource strain: Not on file   ??? Food insecurity - worry: Not on file   ??? Food insecurity - inability: Not on file   ??? Transportation needs - medical: Not on file   ??? Transportation needs - non-medical: Not on file   Occupational History   ??? Not on file   Tobacco Use   ??? Smoking status: Former Smoker    ??? Smokeless tobacco: Never Used   ??? Tobacco comment: quit years ago   Substance and Sexual Activity   ??? Alcohol use: No   ??? Drug use: No   ??? Sexual activity: Yes     Partners: Female, Male   Other Topics Concern   ??? Not on file   Social History Narrative    ** Merged History Encounter **          Family History   Problem Relation Age of Onset   ??? Alzheimer Mother    ??? Diabetes Mother    ??? Cancer Mother         colon cancer    ??? Hypertension Father    ??? Heart Disease Father    ??? Cancer Father         prostate, lung cancer    ??? Diabetes Sister    ??? Heart Disease Sister    ??? Diabetes Brother  Current Outpatient Medications   Medication Sig Dispense Refill   ??? butalbital-acetaminophen-caffeine (FIORICET, ESGIC) 50-325-40 mg per tablet Take 1 Tab by mouth every six (6) hours as needed for Pain. 20 Tab 1   ??? esomeprazole (NEXIUM) 20 mg capsule Take 20 mg by mouth two (2) times a day.     ??? finasteride (PROSCAR) 5 mg tablet Take 1 Tab by mouth daily. 30 Tab 2   ??? HUMULIN R REGULAR U-100 INSULN 100 unit/mL injection As needed per sliding scale 1 Vial 6   ??? Insulin Syringe-Needle U-100 1 mL 29 gauge x 1/2" syrg Three times per day and as needed as per sliding scale 100 Syringe 6   ??? LORazepam (ATIVAN) 1 mg tablet Take 1 Tab by mouth every eight (8) hours as needed for Anxiety for up to 90 days. Max Daily Amount: 3 mg. 90 Tab 0   ??? sucralfate (CARAFATE) 1 gram tablet TAKE 1 TABLET BY MOUTH FOUR TIMES DAILY WITH MEALS AND AT BEDTIME 120 Tab 0   ??? propranolol (INDERAL) 20 mg tablet Take 1 Tab by mouth three (3) times daily. (Patient taking differently: Take 20 mg by mouth two (2) times a day.) 90 Tab 2   ??? metFORMIN (GLUCOPHAGE) 500 mg tablet TAKE 2 TABLETS BY MOUTH TWICE DAILY WITH MEALS(GENERIC FOR GLUCOPHAGE) 360 Tab 0   ??? prochlorperazine (COMPAZINE) 10 mg tablet Take 0.5 Tabs by mouth every six (6) hours as needed. 30 Tab 5   ??? gemfibrozil (LOPID) 600 mg tablet TAKE 1 TABLET BY MOUTH TWICE DAILY 180 Tab 3    ??? ketoconazole (NIZORAL) 2 % topical cream Apply  to affected area daily. 15 g 0   ??? metaxalone (SKELAXIN) 800 mg tablet Take 800 mg by mouth three (3) times daily as needed.     ??? augmented betamethasone dipropionate (DIPROLENE-AF) 0.05 % ointment Apply  to affected area two (2) times a day.     ??? naloxone (NARCAN) 4 mg/actuation nasal spray Use 1 spray intranasally into 1 nostril. Use a new Narcan nasal spray for subsequent doses and administer into alternating nostrils. May repeat every 2 to 3 minutes as needed for opioid overdose symptoms. 1 Each 0   ??? oxyCODONE IR (ROXICODONE) 20 mg immediate release tablet TK 1 T PO Q 6 HOURS  0   ??? aspirin 81 mg chewable tablet Take 81 mg by mouth daily.     ??? cholecalciferol, vitamin D3, (VITAMIN D3) 2,000 unit tab Take  by mouth.     ??? B.infantis-B.ani-B.long-B.bifi (PROBIOTIC 4X) 10-15 mg TbEC Take  by mouth.     ??? topiramate (TOPAMAX) 100 mg tablet Take 1 Tab by mouth daily. 90 Tab 3   ??? citalopram (CELEXA) 40 mg tablet Take 1 Tab by mouth daily. 90 Tab 3   ??? lactulose (CHRONULAC) 10 gram/15 mL solution Take  by mouth three (3) times daily.     ??? cyclobenzaprine (FLEXERIL) 10 mg tablet Take  by mouth three (3) times daily as needed for Muscle Spasm(s).         Review of Systems  Constitutional: The patient has no acute distress or discomfort.  HEENT: The patient denies recent head trauma, eye pain, blurred vision,  hearing deficit, oropharyngeal mucosal pain or lesions, and the patient denies throat pain or discomfort.  Lymphatics: The patient denies palpable peripheral lymphadenopathy.  Hematologic: The patient denies having bruising, bleeding, or progressive fatigue.  Respiratory: Patient denies having shortness of breath, cough, sputum production,  fever, or dyspnea on exertion.  Cardiovascular: The patient denies having leg pain, leg swelling, heart palpitations, chest permit, chest pain, or lightheadedness.  The patient denies having dyspnea on exertion.   Gastrointestinal: The patient denies having nausea, emesis, or diarrhea. The patient denies having any hematemesis or blood in the stool.  Genitourinary: Patient denies having urinary urgency, frequency, or dysuria.  The patient denies having blood in the urine.  Psychological: The patient denies having symptoms of nervousness, anxiety, depression, or thoughts of harming self.  Skin: Patient denies having skin rashes, skin, ulcerations, or unexplained itching or pruritus.  Musculoskeletal: The patient denies having pain in the joints or bones.      Objective:     Visit Vitals  BP 98/60   Pulse 67   Temp 98.3 ??F (36.8 ??C) (Oral)   Resp 15   Ht 5\' 9"  (1.753 m)   Wt 59.8 kg (131 lb 12.8 oz)   SpO2 99%   BMI 19.46 kg/m??     ECOG PS=0  Physical Exam:   Gen. Appearance: The patient is in no acute distress.  Skin: There is no bruise or rash.  HEENT: The exam is unremarkable.  Neck: Supple without lymphadenopathy or thyromegaly.  Lungs: Clear to auscultation and percussion; there are no wheezes or rhonchi.  Heart: Regular rate and rhythm; there are no murmurs, gallops, or rubs.  Abdomen: Bowel sounds are present and normal.  There is no guarding, tenderness, or hepatosplenomegaly.  Extremities: There is no clubbing, cyanosis, or edema.  Neurologic: There are no focal neurologic deficits.  Lymphatics: There is no palpable peripheral lymphadenopathy. Musculoskeletal: The patient has full range of motion at all joints.  There is no evidence of joint deformity or effusions.  There is no focal joint tenderness.  Psychological/psychiatric: There is no clinical evidence of anxiety, depression, or melancholy.    Lab data:      Results for orders placed or performed during the hospital encounter of 04/28/17   CBC WITH 3 PART DIFF     Status: Abnormal   Result Value Ref Range Status    WBC 3.2 (L) 4.5 - 13.0 K/uL Final    RBC 3.84 (L) 4.10 - 5.10 M/uL Final    HGB 9.8 (L) 12.0 - 16.0 g/dL Final    HCT 32.5 (L) 36 - 48 % Final     MCV 84.6 78 - 102 FL Final    MCH 25.5 25.0 - 35.0 PG Final    MCHC 30.2 (L) 31 - 37 g/dL Final    RDW 14.9 (H) 11.5 - 14.5 % Final    PLATELET 80 (L) 140 - 440 K/uL Final    NEUTROPHILS 76 (H) 40 - 70 % Final    MIXED CELLS 8 0.1 - 17 % Final    LYMPHOCYTES 16 14 - 44 % Final    ABS. NEUTROPHILS 2.4 1.8 - 9.5 K/UL Final    ABS. MIXED CELLS 0.3 0.0 - 2.3 K/uL Final    ABS. LYMPHOCYTES 0.5 (L) 1.1 - 5.9 K/UL Final     Comment: Test performed at [facility]. Results Reviewed by Medical Director.    DF AUTOMATED   Final           Assessment:     1. Iron deficiency anemia secondary to inadequate dietary iron intake    2. Thrombocytopenia ([place])    3. Myelodysplastic syndrome ([place])      Plan:   Myelodysplastic syndrome, refractory anemia  due to myelodysplastic syndrome/iron deficiency anemia: Have explained to the patient that his hemoglobin today is 9.8 g/dL with hematocrit of 32.5 %.  He did receive Procrit at a dose of 60,000 units yesterday.  I will check his iron profile and ferritin levels at this time.  If his ferritin level is below 100 and we will need to give him intravenous iron to replenish his ferritin level.    Chronic leukopenia: The CBC from today shows that his WBC count is 3.3 with an absolute neutrophil count which is normal at 2.4.  However the absolute lymphocyte count is low at 0.5.  These will continue to be monitored.  Therapeutic intervention is not warranted.    Thrombocytopenia: The patient has a stable lytic count of 80,000.  No therapeutic intervention is warranted.    Follow-up in 2 months  Orders Placed This Encounter   ??? COMPLETE CBC & AUTO DIFF WBC   ??? InHouse CBC ([facility])     Standing Status:   Future     Number of Occurrences:   1     Standing Expiration Date:   76/06/9507   ??? METABOLIC PANEL, COMPREHENSIVE     Standing Status:   Future     Standing Expiration Date:   04/29/2018   ??? IRON PROFILE     Standing Status:   Future      Standing Expiration Date:   04/29/2018   ??? FERRITIN     Standing Status:   Future     Standing Expiration Date:   04/29/2018       Suzy Bouchard, MD  04/28/2017      Please note: This document has been produced using voice recognition software.  Unrecognized errors in transcription may be present.                                                                                    Hematology/Oncology  Progress Note  Name: William Pauley Sr.  Date: 04/28/2017  DOB: June 07, 1946    PCP: [name], MD     Mr. Treto is a 71 y.o.-year-old man who has myelodysplastic syndrome, chronic leukopenia, and severe anemia.  Current therapy: Procrit 60,000 units every 2 weeks when the hemoglobin is below 10 g/dL.    Subjective:     Mr. Lipschutz is a 71 year old man who has myelodysplastic syndrome with severe anemia.  He also has chronic leukopenia and thrombocytopenia.  He is currently receiving Procrit 60,000 units every 2 weeks.  Today he is complaining of some fatigue and weakness.  His wife reports that although he is eating well he is continuing to lose weight and he is hypersomnolent, sleeping all of the time.    Past medical history, family history, and social history: these were reviewed and remains unchanged.    Past Medical History:   Diagnosis Date   ??? Anemia    ??? Anxiety    ??? Chronic pain    ??? Cirrhosis of liver ([place])    ??? Diabetes ([place])    ??? GERD (gastroesophageal reflux disease)    ??? Hyperlipemia    ??? Hypertension    ??? Hypotension    ???  Migraine    ??? Other cirrhosis of liver ([place])    ??? Stroke [facility])     had 3 strokes    ??? Thrombocytopenia ([place])      Past Surgical History:   Procedure Laterality Date   ??? HX CHOLECYSTECTOMY     ??? HX ORTHOPAEDIC      R hand sx   ??? HX OTHER SURGICAL      Hand surgery- Nail gun went through finger     Social History     Socioeconomic History   ??? Marital status: MARRIED     Spouse name: Not on file   ??? Number of children: Not on file   ??? Years of education: Not on file    ??? Highest education level: Not on file   Social Needs   ??? Financial resource strain: Not on file   ??? Food insecurity - worry: Not on file   ??? Food insecurity - inability: Not on file   ??? Transportation needs - medical: Not on file   ??? Transportation needs - non-medical: Not on file   Occupational History   ??? Not on file   Tobacco Use   ??? Smoking status: Former Smoker   ??? Smokeless tobacco: Never Used   ??? Tobacco comment: quit years ago   Substance and Sexual Activity   ??? Alcohol use: No   ??? Drug use: No   ??? Sexual activity: Yes     Partners: Female, Male   Other Topics Concern   ??? Not on file   Social History Narrative    ** Merged History Encounter **          Family History   Problem Relation Age of Onset   ??? Alzheimer Mother    ??? Diabetes Mother    ??? Cancer Mother         colon cancer    ??? Hypertension Father    ??? Heart Disease Father    ??? Cancer Father         prostate, lung cancer    ??? Diabetes Sister    ??? Heart Disease Sister    ??? Diabetes Brother      Current Outpatient Medications   Medication Sig Dispense Refill   ??? butalbital-acetaminophen-caffeine (FIORICET, ESGIC) 50-325-40 mg per tablet Take 1 Tab by mouth every six (6) hours as needed for Pain. 20 Tab 1   ??? esomeprazole (NEXIUM) 20 mg capsule Take 20 mg by mouth two (2) times a day.     ??? finasteride (PROSCAR) 5 mg tablet Take 1 Tab by mouth daily. 30 Tab 2   ??? HUMULIN R REGULAR U-100 INSULN 100 unit/mL injection As needed per sliding scale 1 Vial 6   ??? Insulin Syringe-Needle U-100 1 mL 29 gauge x 1/2" syrg Three times per day and as needed as per sliding scale 100 Syringe 6   ??? LORazepam (ATIVAN) 1 mg tablet Take 1 Tab by mouth every eight (8) hours as needed for Anxiety for up to 90 days. Max Daily Amount: 3 mg. 90 Tab 0   ??? sucralfate (CARAFATE) 1 gram tablet TAKE 1 TABLET BY MOUTH FOUR TIMES DAILY WITH MEALS AND AT BEDTIME 120 Tab 0   ??? propranolol (INDERAL) 20 mg tablet Take 1 Tab by mouth three (3) times  daily. (Patient taking differently: Take 20 mg by mouth two (2) times a day.) 90 Tab 2   ??? metFORMIN (GLUCOPHAGE) 500 mg tablet TAKE 2 TABLETS BY  MOUTH TWICE DAILY WITH MEALS(GENERIC FOR GLUCOPHAGE) 360 Tab 0   ??? prochlorperazine (COMPAZINE) 10 mg tablet Take 0.5 Tabs by mouth every six (6) hours as needed. 30 Tab 5   ??? gemfibrozil (LOPID) 600 mg tablet TAKE 1 TABLET BY MOUTH TWICE DAILY 180 Tab 3   ??? ketoconazole (NIZORAL) 2 % topical cream Apply  to affected area daily. 15 g 0   ??? metaxalone (SKELAXIN) 800 mg tablet Take 800 mg by mouth three (3) times daily as needed.     ??? augmented betamethasone dipropionate (DIPROLENE-AF) 0.05 % ointment Apply  to affected area two (2) times a day.     ??? naloxone (NARCAN) 4 mg/actuation nasal spray Use 1 spray intranasally into 1 nostril. Use a new Narcan nasal spray for subsequent doses and administer into alternating nostrils. May repeat every 2 to 3 minutes as needed for opioid overdose symptoms. 1 Each 0   ??? oxyCODONE IR (ROXICODONE) 20 mg immediate release tablet TK 1 T PO Q 6 HOURS  0   ??? aspirin 81 mg chewable tablet Take 81 mg by mouth daily.     ??? cholecalciferol, vitamin D3, (VITAMIN D3) 2,000 unit tab Take  by mouth.     ??? B.infantis-B.ani-B.long-B.bifi (PROBIOTIC 4X) 10-15 mg TbEC Take  by mouth.     ??? topiramate (TOPAMAX) 100 mg tablet Take 1 Tab by mouth daily. 90 Tab 3   ??? citalopram (CELEXA) 40 mg tablet Take 1 Tab by mouth daily. 90 Tab 3   ??? lactulose (CHRONULAC) 10 gram/15 mL solution Take  by mouth three (3) times daily.     ??? cyclobenzaprine (FLEXERIL) 10 mg tablet Take  by mouth three (3) times daily as needed for Muscle Spasm(s).         Review of Systems  Constitutional: The patient has no acute distress or discomfort.  HEENT: The patient denies recent head trauma, eye pain, blurred vision,  hearing deficit, oropharyngeal mucosal pain or lesions, and the patient denies throat pain or discomfort.   Lymphatics: The patient denies palpable peripheral lymphadenopathy.  Hematologic: The patient denies having bruising, bleeding, or progressive fatigue.  Respiratory: Patient denies having shortness of breath, cough, sputum production, fever, or dyspnea on exertion.  Cardiovascular: The patient denies having leg pain, leg swelling, heart palpitations, chest permit, chest pain, or lightheadedness.  The patient denies having dyspnea on exertion.  Gastrointestinal: The patient denies having nausea, emesis, or diarrhea. The patient denies having any hematemesis or blood in the stool.  Genitourinary: Patient denies having urinary urgency, frequency, or dysuria.  The patient denies having blood in the urine.  Psychological: The patient denies having symptoms of nervousness, anxiety, depression, or thoughts of harming self.  Skin: Patient denies having skin rashes, skin, ulcerations, or unexplained itching or pruritus.  Musculoskeletal: The patient denies having pain in the joints or bones.      Objective:     Visit Vitals  BP 98/60   Pulse 67   Temp 98.3 ??F (36.8 ??C) (Oral)   Resp 15   Ht 5\' 9"  (1.753 m)   Wt 59.8 kg (131 lb 12.8 oz)   SpO2 99%   BMI 19.46 kg/m??     ECOG PS=0  Physical Exam:   Gen. Appearance: The patient is in no acute distress.  Skin: There is no bruise or rash.  HEENT: The exam is unremarkable.  Neck: Supple without lymphadenopathy or thyromegaly.  Lungs: Clear to auscultation and percussion; there are no wheezes  or rhonchi.  Heart: Regular rate and rhythm; there are no murmurs, gallops, or rubs.  Abdomen: Bowel sounds are present and normal.  There is no guarding, tenderness, or hepatosplenomegaly.  Extremities: There is no clubbing, cyanosis, or edema.  Neurologic: There are no focal neurologic deficits.  Lymphatics: There is no palpable peripheral lymphadenopathy. Musculoskeletal: The patient has full range of motion at all joints.  There is no evidence of  joint deformity or effusions.  There is no focal joint tenderness.  Psychological/psychiatric: There is no clinical evidence of anxiety, depression, or melancholy.    Lab data:      Results for orders placed or performed during the hospital encounter of 04/28/17   CBC WITH 3 PART DIFF     Status: Abnormal   Result Value Ref Range Status    WBC 3.2 (L) 4.5 - 13.0 K/uL Final    RBC 3.84 (L) 4.10 - 5.10 M/uL Final    HGB 9.8 (L) 12.0 - 16.0 g/dL Final    HCT 32.5 (L) 36 - 48 % Final    MCV 84.6 78 - 102 FL Final    MCH 25.5 25.0 - 35.0 PG Final    MCHC 30.2 (L) 31 - 37 g/dL Final    RDW 14.9 (H) 11.5 - 14.5 % Final    PLATELET 80 (L) 140 - 440 K/uL Final    NEUTROPHILS 76 (H) 40 - 70 % Final    MIXED CELLS 8 0.1 - 17 % Final    LYMPHOCYTES 16 14 - 44 % Final    ABS. NEUTROPHILS 2.4 1.8 - 9.5 K/UL Final    ABS. MIXED CELLS 0.3 0.0 - 2.3 K/uL Final    ABS. LYMPHOCYTES 0.5 (L) 1.1 - 5.9 K/UL Final     Comment: Test performed at [facility]. Results Reviewed by Medical Director.    DF AUTOMATED   Final           Assessment:     1. Iron deficiency anemia secondary to inadequate dietary iron intake    2. Thrombocytopenia ([place])    3. Myelodysplastic syndrome ([place])      Plan:   Myelodysplastic syndrome, refractory anemia due to myelodysplastic syndrome/iron deficiency anemia: Have explained to the patient that his hemoglobin today is 10.3 g/dL with hematocrit of 34 %.  He did receive Procrit at a dose of 60,000 units yesterday.  I will check his iron profile and ferritin levels at this time.  If his ferritin level is below 100 and we will need to give him intravenous iron to replenish his ferritin level.    Chronic leukopenia: The CBC from today shows that his WBC count is 3.3 with an absolute neutrophil count which is normal at 2.6.  However the absolute lymphocyte count is low at 0.5.  These will continue to be monitored.  Therapeutic intervention is not warranted.     Thrombocytopenia: The patient has a stable lytic count of 85,000.  No therapeutic intervention is warranted.    Follow-up in 2 months  Orders Placed This Encounter   ??? COMPLETE CBC & AUTO DIFF WBC   ??? InHouse CBC ([facility])     Standing Status:   Future     Number of Occurrences:   1     Standing Expiration Date:   05/06/1477   ??? METABOLIC PANEL, COMPREHENSIVE     Standing Status:   Future     Standing Expiration Date:   04/29/2018   ???  IRON PROFILE     Standing Status:   Future     Standing Expiration Date:   04/29/2018   ??? FERRITIN     Standing Status:   Future     Standing Expiration Date:   04/29/2018       Suzy Bouchard, MD  04/28/2017      Please note: This document has been produced using voice recognition software.  Unrecognized errors in transcription may be present.

## 2017-04-28 NOTE — Telephone Encounter (Signed)
Pt's wife called again requesting medication. Stated pt has been in bed for the last 2 days & needs ASAP.

## 2017-04-28 NOTE — Patient Instructions (Signed)
Myelodysplastic Syndromes: Care Instructions  Your Care Instructions  Myelodysplastic syndromes, also called MDS, are a group of rare conditions in which the bone marrow does not make enough healthy blood cells. Normally, the bone marrow makes red blood cells, white blood cells, and platelets. These cells carry oxygen in the blood, help the body fight infections, and help the blood clot. With MDS, you may feel weak and tired, get infections often, and bruise easily, although symptoms tend to vary.  MDS is a form of blood cancer. In some cases, MDS can turn into acute myeloid leukemia (AML), another type of cancer. Some people develop MDS after treatment for cancer or exposure to pesticides or other chemicals. But in most cases, the cause of MDS is not known.  Your doctor will use the results of blood tests to guide your treatment. There are many types of MDS, with different treatment plans for each. If you have enough red blood cells and are feeling all right, you may not need active treatment, but you and your doctor will want to watch your condition carefully. If you start feeling lightheaded and have no energy, you may need a blood transfusion. Your doctor also may give you antibiotics to prevent or treat infection.  Follow-up care is a key part of your treatment and safety. Be sure to make and go to all appointments, and call your doctor if you are having problems. It's also a good idea to know your test results and keep a list of the medicines you take.  How can you care for yourself at home?  ?? Take your medicines exactly as prescribed. Call your doctor if you think you are having a problem with your medicine. You will get more details on the specific medicines your doctor prescribes.  ?? If your doctor prescribed antibiotics, take them as directed. Do not stop taking them just because you feel better. You need to take the full course of antibiotics. If you have side effects from antibiotics, tell  your doctor.  ?? Take steps to control your stress and workload. Learn relaxation techniques.  ? Share your feelings. Stress and tension affect our emotions. By expressing your feelings to others, you may be able to understand and cope with them.  ? Consider joining a support group. Talking about a problem with your spouse, a good friend, or other people with similar problems is a good way to reduce tension and stress.  ? Express yourself with art. Try writing, crafts, dance, or art to relieve stress.  ? Be kind to your body and mind. Getting enough sleep, eating a healthy diet, and taking time to do things you enjoy can contribute to an overall feeling of balance in your life and help reduce stress.  ? Get help if you need it. Discuss your concerns with your doctor or counselor.  ?? Do not smoke. Smoking can make blood problems worse. If you need help quitting, talk to your doctor about stop-smoking programs and medicines. These can increase your chances of quitting for good.  ?? If you have not already done so, prepare a list of advance directives. Advance directives are instructions to your doctor and family members about what kind of care you want if you become unable to speak or express yourself.  ?? Call the [facility] [phone]) or visit its website at [email or web address] for more information.  When should you call for help?  Call 911 anytime you think you may need emergency care. For example,  call if:  ?? ?? You passed out (lost consciousness).   ??Call your doctor now or seek immediate medical care if:  ?? ?? You have a fever.   ?? ?? You have abnormal bleeding.   ?? ?? You have new or worse pain.   ?? ?? You think you have an infection.   ?? ?? You have new symptoms, such as a cough, belly pain, vomiting, diarrhea, or a rash.   ??Watch closely for changes in your health, and be sure to contact your doctor if:  ?? ?? You are much more tired than usual.    ?? ?? You have swollen glands in your armpits, groin, or neck.   ?? ?? You do not get better as expected.   Where can you learn more?  Go to http://www.healthwise.net/[employer].  Enter U281 in the search box to learn more about "Myelodysplastic Syndromes: Care Instructions."  Current as of: September 22, 2016  Content Version: 11.8  ?? 2006-2018 [employer]. Care instructions adapted under license by [employer] (which disclaims liability or warranty for this information). If you have questions about a medical condition or this instruction, always ask your healthcare professional. [employer] disclaims any warranty or liability for your use of this information.

## 2017-04-29 LAB — METABOLIC PANEL, COMPREHENSIVE
A-G Ratio: 1.2 (ref 0.8–1.7)
ALT (SGPT): 25 U/L (ref 16–61)
AST (SGOT): 26 U/L (ref 15–37)
Albumin: 4.2 g/dL (ref 3.4–5.0)
Alk. phosphatase: 204 U/L — ABNORMAL HIGH (ref 45–117)
Anion gap: 7 mmol/L (ref 3.0–18)
BUN/Creatinine ratio: 16 (ref 12–20)
BUN: 17 MG/DL (ref 7.0–18)
Bilirubin, total: 0.4 MG/DL (ref 0.2–1.0)
CO2: 24 mmol/L (ref 21–32)
Calcium: 9.3 MG/DL (ref 8.5–10.1)
Chloride: 108 mmol/L (ref 100–108)
Creatinine: 1.08 MG/DL (ref 0.6–1.3)
GFR est AA: >60 mL/min/{1.73_m2} (ref >60)
GFR est non-AA: >60 mL/min/{1.73_m2} (ref >60)
Globulin: 3.5 g/dL (ref 2.0–4.0)
Glucose: 123 mg/dL — ABNORMAL HIGH (ref 74–99)
Potassium: 4.5 mmol/L (ref 3.5–5.5)
Protein, total: 7.7 g/dL (ref 6.4–8.2)
Sodium: 139 mmol/L (ref 136–145)

## 2017-04-29 LAB — IRON PROFILE
Iron % saturation: 4 %
Iron: 20 ug/dL — ABNORMAL LOW (ref 50–175)
TIBC: 495 ug/dL — ABNORMAL HIGH (ref 250–450)

## 2017-04-29 LAB — FERRITIN: Ferritin: 6 NG/ML — ABNORMAL LOW (ref 8–388)

## 2017-05-03 ENCOUNTER — Encounter: Payer: MEDICARE | Primary: Legal Medicine

## 2017-05-03 ENCOUNTER — Encounter: Attending: Legal Medicine | Primary: Legal Medicine

## 2017-05-03 MED ORDER — SUCRALFATE 1 GRAM TAB
1 gram | ORAL_TABLET | ORAL | 0 refills | Status: DC
Start: 2017-05-03 — End: 2017-06-02

## 2017-05-03 MED ORDER — ROSUVASTATIN 10 MG TAB
10 mg | ORAL_TABLET | ORAL | 0 refills | Status: DC
Start: 2017-05-03 — End: 2017-07-14

## 2017-05-04 ENCOUNTER — Encounter: Attending: Legal Medicine | Primary: Legal Medicine

## 2017-05-09 ENCOUNTER — Institutional Professional Consult (permissible substitution): Primary: Legal Medicine

## 2017-05-09 ENCOUNTER — Inpatient Hospital Stay: Admit: 2017-05-09 | Primary: Legal Medicine

## 2017-05-09 DIAGNOSIS — D508 Other iron deficiency anemias: Secondary | ICD-10-CM

## 2017-05-09 DIAGNOSIS — D696 Thrombocytopenia, unspecified: Secondary | ICD-10-CM

## 2017-05-09 MED ORDER — EPOETIN ALFA 20,000 UNIT/ML IJ SOLN
20000 unit/mL | Freq: Once | INTRAMUSCULAR | Status: AC
Start: 2017-05-09 — End: 2017-05-10

## 2017-05-09 MED ORDER — EPOETIN ALFA 40,000 UNIT/ML IJ SOLN
40000 unit/mL | Freq: Once | INTRAMUSCULAR | Status: AC
Start: 2017-05-09 — End: 2017-05-10

## 2017-05-09 MED ORDER — SODIUM CHLORIDE 0.9 % IV
100 mg iron/5 mL | Freq: Once | INTRAVENOUS | Status: AC
Start: 2017-05-09 — End: 2017-05-10
  Administered 2017-05-10: 15:00:00 via INTRAVENOUS

## 2017-05-09 MED FILL — VENOFER 100 MG IRON/5 ML INTRAVENOUS SOLUTION: 100 mg iron/5 mL | INTRAVENOUS | Qty: 12.5

## 2017-05-09 NOTE — Progress Notes (Signed)
William Blanchard called to say that William Blanchard was not feeling well and would not be today for his Venofer and Procrit.  His appt has been rescheduled to tomorrow at 10:00.

## 2017-05-10 ENCOUNTER — Inpatient Hospital Stay: Payer: MEDICARE | Primary: Legal Medicine

## 2017-05-10 ENCOUNTER — Inpatient Hospital Stay: Admit: 2017-05-10 | Primary: Legal Medicine

## 2017-05-10 ENCOUNTER — Institutional Professional Consult (permissible substitution): Admit: 2017-05-10 | Discharge: 2017-05-10 | Payer: PRIVATE HEALTH INSURANCE | Primary: Legal Medicine

## 2017-05-10 ENCOUNTER — Inpatient Hospital Stay: Admit: 2017-05-10 | Payer: MEDICARE | Primary: Legal Medicine

## 2017-05-10 DIAGNOSIS — D509 Iron deficiency anemia, unspecified: Secondary | ICD-10-CM

## 2017-05-10 LAB — CBC WITH 3 PART DIFF
ABS. LYMPHOCYTES: 0.5 10*3/uL — ABNORMAL LOW (ref 1.1–5.9)
ABS. MIXED CELLS: 0.5 10*3/uL (ref 0.0–2.3)
ABS. NEUTROPHILS: 2.7 10*3/uL (ref 1.8–9.5)
HCT: 30.4 % — ABNORMAL LOW (ref 36–48)
HGB: 9 g/dL — ABNORMAL LOW (ref 12.0–16.0)
LYMPHOCYTES: 14 % (ref 14–44)
MCH: 24.7 PG — ABNORMAL LOW (ref 25.0–35.0)
MCHC: 29.6 g/dL — ABNORMAL LOW (ref 31–37)
MCV: 83.3 FL (ref 78–102)
Mixed cells: 15 % (ref 0.1–17)
NEUTROPHILS: 72 % — ABNORMAL HIGH (ref 40–70)
PLATELET: 122 10*3/uL — ABNORMAL LOW (ref 140–440)
RBC: 3.65 M/uL — ABNORMAL LOW (ref 4.10–5.10)
RDW: 14.7 % — ABNORMAL HIGH (ref 11.5–14.5)
WBC: 3.7 10*3/uL — ABNORMAL LOW (ref 4.5–13.0)

## 2017-05-10 MED ORDER — EPOETIN ALFA 40,000 UNIT/ML IJ SOLN
40000 unit/mL | Freq: Once | INTRAMUSCULAR | Status: AC
Start: 2017-05-10 — End: 2017-05-10

## 2017-05-10 MED ORDER — SODIUM CHLORIDE 0.9 % IJ SYRG
INTRAMUSCULAR | Status: DC | PRN
Start: 2017-05-10 — End: 2017-05-14
  Administered 2017-05-10 (×2): via INTRAVENOUS

## 2017-05-10 MED ORDER — EPOETIN ALFA 20,000 UNIT/ML IJ SOLN
20000 unit/mL | Freq: Once | INTRAMUSCULAR | Status: AC
Start: 2017-05-10 — End: 2017-05-10

## 2017-05-10 MED FILL — BD POSIFLUSH NORMAL SALINE 0.9 % INJECTION SYRINGE: INTRAMUSCULAR | Qty: 40

## 2017-05-10 NOTE — Progress Notes (Signed)
[facility] Progress Note    Date: May 10, 2017    Name: William Blanchard [facility]    MRN: [id number]         DOB: 1946/03/13      [name] was assessed and education was provided.     [name] vitals were reviewed and patient was observed for 5 minutes prior to treatment.   Visit Vitals  BP 95/55 (BP 1 Location: Right arm, BP Patient Position: At rest;Sitting)   Pulse 64   Temp 97.9 ??F (36.6 ??C)   Resp 16   SpO2 99%       Lab results were obtained and reviewed.  Recent Results (from the past 12 hour(s))   CBC WITH 3 PART DIFF    Collection Time: 05/10/17 10:17 AM   Result Value Ref Range    WBC 3.7 (L) 4.5 - 13.0 K/uL    RBC 3.65 (L) 4.10 - 5.10 M/uL    HGB 9.0 (L) 12.0 - 16.0 g/dL    HCT 30.4 (L) 36 - 48 %    MCV 83.3 78 - 102 FL    MCH 24.7 (L) 25.0 - 35.0 PG    MCHC 29.6 (L) 31 - 37 g/dL    RDW 14.7 (H) 11.5 - 14.5 %    PLATELET 122 (L) 140 - 440 K/uL    NEUTROPHILS 72 (H) 40 - 70 %    MIXED CELLS 15 0.1 - 17 %    LYMPHOCYTES 14 14 - 44 %    ABS. NEUTROPHILS 2.7 1.8 - 9.5 K/UL    ABS. MIXED CELLS 0.5 0.0 - 2.3 K/uL    ABS. LYMPHOCYTES 0.5 (L) 1.1 - 5.9 K/UL    DF AUTOMATED         Procrit not given for H&H 9.0/30.4.    Venofer 250 mg IV given via peripheral IV after brisk blood return obtained.     Patient armband removed and shredded.    [name] was discharged from [facility] in stable condition at 1125. He is to return on 05/25/17 at 1300 for his next appointment for Procrit Q 2 wks and dose #2 of 4 Venofer Q 2 wks.    Lynnda Shields, RN  May 10, 2017  3:59 PM

## 2017-05-12 ENCOUNTER — Encounter: Attending: Legal Medicine | Primary: Legal Medicine

## 2017-05-16 ENCOUNTER — Encounter

## 2017-05-17 ENCOUNTER — Encounter: Payer: MEDICARE | Primary: Legal Medicine

## 2017-05-17 MED ORDER — METFORMIN 500 MG TAB
500 mg | ORAL_TABLET | ORAL | 0 refills | Status: DC
Start: 2017-05-17 — End: 2017-08-11

## 2017-05-18 MED ORDER — SODIUM CHLORIDE 0.9 % IV
100 mg iron/5 mL | Freq: Once | INTRAVENOUS | Status: AC
Start: 2017-05-18 — End: 2017-05-25
  Administered 2017-05-25: 18:00:00 via INTRAVENOUS

## 2017-05-18 MED FILL — VENOFER 100 MG IRON/5 ML INTRAVENOUS SOLUTION: 100 mg iron/5 mL | INTRAVENOUS | Qty: 12.5

## 2017-05-23 ENCOUNTER — Encounter: Payer: MEDICARE | Primary: Legal Medicine

## 2017-05-24 ENCOUNTER — Ambulatory Visit
Admit: 2017-05-24 | Discharge: 2017-05-24 | Payer: PRIVATE HEALTH INSURANCE | Attending: Legal Medicine | Primary: Legal Medicine

## 2017-05-24 DIAGNOSIS — G43009 Migraine without aura, not intractable, without status migrainosus: Secondary | ICD-10-CM

## 2017-05-24 MED ORDER — SUMATRIPTAN 100 MG TAB
100 mg | ORAL_TABLET | Freq: Once | ORAL | 0 refills | Status: DC | PRN
Start: 2017-05-24 — End: 2017-06-02

## 2017-05-24 MED ORDER — LORAZEPAM 1 MG TAB
1 mg | ORAL_TABLET | Freq: Three times a day (TID) | ORAL | 0 refills | Status: DC | PRN
Start: 2017-05-24 — End: 2017-07-21

## 2017-05-24 NOTE — Progress Notes (Addendum)
William Blalock Homewood Sr.     Chief Complaint   Patient presents with   ??? Migraine   ??? Skin Problem     Vitals:    05/24/17 0918   BP: 119/69   Pulse: 64   Resp: 17   Temp: 97.7 ??F (36.5 ??C)   TempSrc: Oral   SpO2: 100%   Weight: 134 lb (60.8 kg)   Height: '5\' 9"'$  (1.753 m)   PainSc:   4         HPI: Patient is here for follow-up to get his Ativan refill for anxiety.    Also has history of migraine he was using Fioricet but the Fioricet has not been relieving him, and he would like to try other options for his migraine, when they were living in [place] he was going to neurologist to getting Botox as a regular basis, but now he does not want to do the the Botox.      Patient has been complaining about epigastric pain he had a CT scan done that was negative.      Patient is complaining about area that is irritated in his right buttocks, but this area is still irritated,    Past Medical History:   Diagnosis Date   ??? Anemia    ??? Anxiety    ??? Chronic pain    ??? Cirrhosis of liver ([place])    ??? Diabetes ([place])    ??? GERD (gastroesophageal reflux disease)    ??? Hyperlipemia    ??? Hypertension    ??? Hypotension    ??? Migraine    ??? Other cirrhosis of liver ([place])    ??? Stroke [facility])     had 3 strokes    ??? Thrombocytopenia ([place])      Past Surgical History:   Procedure Laterality Date   ??? HX CHOLECYSTECTOMY     ??? HX ORTHOPAEDIC      R hand sx   ??? HX OTHER SURGICAL      Hand surgery- Nail gun went through finger     Social History     Tobacco Use   ??? Smoking status: Former Smoker   ??? Smokeless tobacco: Never Used   ??? Tobacco comment: quit years ago   Substance Use Topics   ??? Alcohol use: No       Family History   Problem Relation Age of Onset   ??? Alzheimer Mother    ??? Diabetes Mother    ??? Cancer Mother         colon cancer    ??? Hypertension Father    ??? Heart Disease Father    ??? Cancer Father         prostate, lung cancer    ??? Diabetes Sister    ??? Heart Disease Sister    ??? Diabetes Brother        Review of Systems    Constitutional: Negative for chills, fever, malaise/fatigue and weight loss.   HENT: Negative for congestion, ear discharge, ear pain, hearing loss, nosebleeds, sinus pain and sore throat.    Eyes: Negative for blurred vision, double vision and discharge.   Respiratory: Negative for cough, hemoptysis, sputum production, shortness of breath and wheezing.    Cardiovascular: Negative for chest pain, palpitations, claudication and leg swelling.   Gastrointestinal: Positive for abdominal pain. Negative for constipation, diarrhea, nausea and vomiting.   Genitourinary: Negative for dysuria, frequency and urgency.   Musculoskeletal: Positive for back pain. Negative for  myalgias.   Skin: Negative for itching and rash.   Neurological: Negative for dizziness, tingling, sensory change, speech change, focal weakness, weakness and headaches.   Psychiatric/Behavioral: Negative for depression and suicidal ideas.       Physical Exam   Constitutional: He is oriented to person, place, and time. He appears well-developed and well-nourished. No distress.   HENT:   Head: Normocephalic and atraumatic.   Mouth/Throat: No oropharyngeal exudate.   Eyes: Conjunctivae are normal. Pupils are equal, round, and reactive to light. Right eye exhibits no discharge. Left eye exhibits no discharge. No scleral icterus.   Neck: Normal range of motion. Neck supple. No thyromegaly present.   Cardiovascular: Normal rate, regular rhythm and normal heart sounds.   Pulmonary/Chest: Effort normal and breath sounds normal. No respiratory distress. He has no rales.   Abdominal: Soft. Bowel sounds are normal. He exhibits no distension and no mass. There is tenderness. There is no rebound.   Musculoskeletal: Normal range of motion. He exhibits no edema, tenderness or deformity.   Lymphadenopathy:     He has no cervical adenopathy.   Neurological: He is alert and oriented to person, place, and time. No cranial nerve deficit. Coordination normal.    Skin: Skin is warm and dry. No rash noted. He is not diaphoretic. There is erythema.   Right side on the buttock and lateral thigh there is an area of erythema was admitted to scab no open wound, I think it is an early stage of pressure ulcer.  I advised the patient to stay off that area, and try to sleep on the other side daily dressing with topical antibiotic cream and a Band-Aid and follow-up in 2 weeks   Psychiatric: He has a normal mood and affect. Judgment and thought content normal.   Nursing note and vitals reviewed.       Assessment and plan     1. Anxiety    - LORazepam (ATIVAN) 1 mg tablet; Take 1 Tab by mouth every eight (8) hours as needed for Anxiety for up to 90 days. Max Daily Amount: 3 mg.  Dispense: 90 Tab; Refill: 0    2. Essential hypertension  Well-controlled    3. Migraine without aura and without status migrainosus, not intractable  Patient would like to try Imitrex as needed  Since the Fioricet is not working  - SUMAtriptan (IMITREX) 100 mg tablet; Take 1 Tab by mouth once as needed for Migraine for up to 1 dose.  Dispense: 10 Tab; Refill: 0    4. NAFLD (nonalcoholic fatty liver disease)  Stable normal liver function test    5. Other cirrhosis of liver ([place])  Normal liver function test I asked his spouse to bring the information of his gastroenterologist so he can see with his recommendation for follow-up    6. Controlled type 2 diabetes mellitus without complication, without long-term current use of insulin (HCC)  Controlled only on short acting insulin    7. Encounter for immunization    - INFLUENZA VIRUS VAC QUAD,SPLIT,PRESV FREE SYRINGE IM    Current Outpatient Medications   Medication Sig Dispense Refill   ??? LORazepam (ATIVAN) 1 mg tablet Take 1 Tab by mouth every eight (8) hours as needed for Anxiety for up to 90 days. Max Daily Amount: 3 mg. 90 Tab 0   ??? SUMAtriptan (IMITREX) 100 mg tablet Take 1 Tab by mouth once as needed for Migraine for up to 1 dose. 10 Tab 0    ???  metFORMIN (GLUCOPHAGE) 500 mg tablet TAKE 2 TABLETS BY MOUTH TWICE DAILY WITH MEALS(GENERIC FOR GLUCOPHAGE) 360 Tab 0   ??? sucralfate (CARAFATE) 1 gram tablet TAKE 1 TABLET BY MOUTH FOUR TIMES DAILY WITH MEALS AND AT BEDTIME 120 Tab 0   ??? rosuvastatin (CRESTOR) 10 mg tablet TAKE 1 TABLET BY MOUTH NIGHTLY 90 Tab 0   ??? butalbital-acetaminophen-caffeine (FIORICET, ESGIC) 50-325-40 mg per tablet Take 1 Tab by mouth every six (6) hours as needed for Pain. 20 Tab 1   ??? esomeprazole (NEXIUM) 20 mg capsule Take 20 mg by mouth two (2) times a day.     ??? finasteride (PROSCAR) 5 mg tablet Take 1 Tab by mouth daily. 30 Tab 2   ??? HUMULIN R REGULAR U-100 INSULN 100 unit/mL injection As needed per sliding scale 1 Vial 6   ??? Insulin Syringe-Needle U-100 1 mL 29 gauge x 1/2" syrg Three times per day and as needed as per sliding scale 100 Syringe 6   ??? propranolol (INDERAL) 20 mg tablet Take 1 Tab by mouth three (3) times daily. (Patient taking differently: Take 20 mg by mouth two (2) times a day.) 90 Tab 2   ??? prochlorperazine (COMPAZINE) 10 mg tablet Take 0.5 Tabs by mouth every six (6) hours as needed. 30 Tab 5   ??? gemfibrozil (LOPID) 600 mg tablet TAKE 1 TABLET BY MOUTH TWICE DAILY 180 Tab 3   ??? ketoconazole (NIZORAL) 2 % topical cream Apply  to affected area daily. 15 g 0   ??? metaxalone (SKELAXIN) 800 mg tablet Take 800 mg by mouth three (3) times daily as needed.     ??? augmented betamethasone dipropionate (DIPROLENE-AF) 0.05 % ointment Apply  to affected area two (2) times a day.     ??? naloxone (NARCAN) 4 mg/actuation nasal spray Use 1 spray intranasally into 1 nostril. Use a new Narcan nasal spray for subsequent doses and administer into alternating nostrils. May repeat every 2 to 3 minutes as needed for opioid overdose symptoms. 1 Each 0   ??? oxyCODONE IR (ROXICODONE) 20 mg immediate release tablet TK 1 T PO Q 6 HOURS  0   ??? aspirin 81 mg chewable tablet Take 81 mg by mouth daily.      ??? cholecalciferol, vitamin D3, (VITAMIN D3) 2,000 unit tab Take  by mouth.     ??? B.infantis-B.ani-B.long-B.bifi (PROBIOTIC 4X) 10-15 mg TbEC Take  by mouth.     ??? topiramate (TOPAMAX) 100 mg tablet Take 1 Tab by mouth daily. 90 Tab 3   ??? citalopram (CELEXA) 40 mg tablet Take 1 Tab by mouth daily. 90 Tab 3   ??? lactulose (CHRONULAC) 10 gram/15 mL solution Take  by mouth three (3) times daily.     ??? cyclobenzaprine (FLEXERIL) 10 mg tablet Take  by mouth three (3) times daily as needed for Muscle Spasm(s).       Facility-Administered Medications Ordered in Other Visits   Medication Dose Route Frequency Provider Last Rate Last Dose   ??? [START ON 05/25/2017] iron sucrose (VENOFER) 250 mg in 0.9% sodium chloride 250 mL IVPB  250 mg IntraVENous ONCE Otoo, Louanne Belton, NP           Patient Active Problem List    Diagnosis Date Noted   ??? Migraine without aura and without status migrainosus, not intractable 04/15/2017   ??? 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 Dermatitis 01/05/2017   ??? Benign prostatic hyperplasia (BPH) with straining on urination 12/31/2016   ???  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 Myelodysplastic syndrome ([place]) 12/22/2016   ??? Type 2 diabetes with nephropathy ([place]) 10/18/2016   ??? Iron deficiency anemia secondary to inadequate dietary iron intake 09/23/2016   ??? Chronic leukopenia 09/23/2016   ??? Thrombocytopenia ([place]) 09/23/2016   ??? 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 Type II diabetes mellitus ([place]) 08/03/2016   ??? HTN (hypertension) 08/03/2016   ??? 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 Stroke ([place]) 08/03/2016   ??? Compressed vertebrae ([place]) 08/03/2016   ??? Chronic back pain greater than 3 months duration 08/03/2016   ??? Hyperlipemia 08/03/2016   ??? Anxiety 08/03/2016   ??? Depression 06/30/2016   ??? Iron deficiency anemia 04/22/2016   ??? NAFLD (nonalcoholic fatty liver disease) 04/22/2016   ??? Other cirrhosis of liver ([place]) 04/22/2016   ??? Chronic anemia 03/18/2016    ??? Controlled type 2 diabetes mellitus without complication, without long-term current use of insulin ([place]) 03/18/2016   ??? Essential hypertension 03/18/2016   ??? Hyperlipidemia 03/18/2016   ??? History of stroke 03/18/2016   ??? Anxiety 03/18/2016   ??? Chronic pain 03/18/2016     Results for orders placed or performed during the hospital encounter of 05/10/17   CBC WITH 3 PART DIFF   Result Value Ref Range    WBC 3.7 (L) 4.5 - 13.0 K/uL    RBC 3.65 (L) 4.10 - 5.10 M/uL    HGB 9.0 (L) 12.0 - 16.0 g/dL    HCT 30.4 (L) 36 - 48 %    MCV 83.3 78 - 102 FL    MCH 24.7 (L) 25.0 - 35.0 PG    MCHC 29.6 (L) 31 - 37 g/dL    RDW 14.7 (H) 11.5 - 14.5 %    PLATELET 122 (L) 140 - 440 K/uL    NEUTROPHILS 72 (H) 40 - 70 %    MIXED CELLS 15 0.1 - 17 %    LYMPHOCYTES 14 14 - 44 %    ABS. NEUTROPHILS 2.7 1.8 - 9.5 K/UL    ABS. MIXED CELLS 0.5 0.0 - 2.3 K/uL    ABS. LYMPHOCYTES 0.5 (L) 1.1 - 5.9 K/UL    [facility] AUTOMATED       Hospital Outpatient Visit on 05/10/2017   Component Date Value Ref Range Status   ??? WBC 05/10/2017 3.7* 4.5 - 13.0 K/uL Final   ??? RBC 05/10/2017 3.65* 4.10 - 5.10 M/uL Final   ??? HGB 05/10/2017 9.0* 12.0 - 16.0 g/dL Final   ??? HCT 05/10/2017 30.4* 36 - 48 % Final   ??? MCV 05/10/2017 83.3  78 - 102 FL Final   ??? MCH 05/10/2017 24.7* 25.0 - 35.0 PG Final   ??? MCHC 05/10/2017 29.6* 31 - 37 g/dL Final   ??? RDW 05/10/2017 14.7* 11.5 - 14.5 % Final   ??? PLATELET 05/10/2017 122* 140 - 440 K/uL Final   ??? NEUTROPHILS 05/10/2017 72* 40 - 70 % Final   ??? MIXED CELLS 05/10/2017 15  0.1 - 17 % Final   ??? LYMPHOCYTES 05/10/2017 14  14 - 44 % Final   ??? ABS. NEUTROPHILS 05/10/2017 2.7  1.8 - 9.5 K/UL Final   ??? ABS. MIXED CELLS 05/10/2017 0.5  0.0 - 2.3 K/uL Final   ??? ABS. LYMPHOCYTES 05/10/2017 0.5* 1.1 - 5.9 K/UL Final    Test performed at [place] Location. Results Reviewed by Medical Director.   ??? DF 05/10/2017 AUTOMATED  Final   Hospital Outpatient Visit on 04/28/2017   Component Date Value Ref Range Status    ??? Sodium 04/28/2017 139  136 - 145 mmol/L Final   ??? Potassium 04/28/2017 4.5  3.5 - 5.5 mmol/L Final   ??? Chloride 04/28/2017 108  100 - 108 mmol/L Final   ??? CO2 04/28/2017 24  21 - 32 mmol/L Final   ??? Anion gap 04/28/2017 7  3.0 - 18 mmol/L Final   ??? Glucose 04/28/2017 123* 74 - 99 mg/dL Final   ??? BUN 04/28/2017 17  7.0 - 18 MG/DL Final   ??? Creatinine 04/28/2017 1.08  0.6 - 1.3 MG/DL Final   ??? BUN/Creatinine ratio 04/28/2017 16  12 - 20   Final   ??? GFR est AA 04/28/2017 >60  >60 ml/min/1.4m Final   ??? GFR est non-AA 04/28/2017 >60  >60 ml/min/1.761mFinal    Comment: (NOTE)  Estimated GFR is calculated using the Modification of Diet in Renal   Disease (MDRD) Study equation, reported for both African Americans   (GFRAA) and non-African Americans (GFRNA), and normalized to 1.7312m body surface area. The physician must decide which value applies to   the patient. The MDRD study equation should only be used in   individuals age 53 31 older. It has not been validated for the   following: pregnant women, patients with serious comorbid conditions,   or on certain medications, or persons with extremes of body size,   muscle mass, or nutritional status.     ??? Calcium 04/28/2017 9.3  8.5 - 10.1 MG/DL Final   ??? Bilirubin, total 04/28/2017 0.4  0.2 - 1.0 MG/DL Final   ??? ALT (SGPT) 04/28/2017 25  16 - 61 U/L Final   ??? AST (SGOT) 04/28/2017 26  15 - 37 U/L Final   ??? Alk. phosphatase 04/28/2017 204* 45 - 117 U/L Final   ??? Protein, total 04/28/2017 7.7  6.4 - 8.2 g/dL Final   ??? Albumin 04/28/2017 4.2  3.4 - 5.0 g/dL Final   ??? Globulin 04/28/2017 3.5  2.0 - 4.0 g/dL Final   ??? A-G Ratio 04/28/2017 1.2  0.8 - 1.7   Final   ??? Iron 04/28/2017 20* 50 - 175 ug/dL Final    Patients receiving metal-binding drugs (e.g. deferoxamine) may show spuriously depressed iron values, as chelated iron may not properly react in the iron assay.   ??? TIBC 04/28/2017 495* 250 - 450 ug/dL Final   ??? Iron % saturation 04/28/2017 4  % Final    ??? Ferritin 04/28/2017 6* 8 - 388 NG/ML Final   Hospital Outpatient Visit on 04/28/2017   Component Date Value Ref Range Status   ??? WBC 04/28/2017 3.2* 4.5 - 13.0 K/uL Final   ??? RBC 04/28/2017 3.84* 4.10 - 5.10 M/uL Final   ??? HGB 04/28/2017 9.8* 12.0 - 16.0 g/dL Final   ??? HCT 04/28/2017 32.5* 36 - 48 % Final   ??? MCV 04/28/2017 84.6  78 - 102 FL Final   ??? MCH 04/28/2017 25.5  25.0 - 35.0 PG Final   ??? MCHC 04/28/2017 30.2* 31 - 37 g/dL Final   ??? RDW 04/28/2017 14.9* 11.5 - 14.5 % Final   ??? PLATELET 04/28/2017 80* 140 - 440 K/uL Final   ??? NEUTROPHILS 04/28/2017 76* 40 - 70 % Final   ??? MIXED CELLS 04/28/2017 8  0.1 - 17 % Final   ??? LYMPHOCYTES 04/28/2017 16  14 - 44 % Final   ???  ABS. NEUTROPHILS 04/28/2017 2.4  1.8 - 9.5 K/UL Final   ??? ABS. MIXED CELLS 04/28/2017 0.3  0.0 - 2.3 K/uL Final   ??? ABS. LYMPHOCYTES 04/28/2017 0.5* 1.1 - 5.9 K/UL Final    Test performed at [place] Location. Results Reviewed by Medical Director.   ??? DF 04/28/2017 AUTOMATED    Final   Hospital Outpatient Visit on 04/25/2017   Component Date Value Ref Range Status   ??? WBC 04/25/2017 3.2* 4.5 - 13.0 K/uL Final   ??? RBC 04/25/2017 3.48* 4.10 - 5.10 M/uL Final   ??? HGB 04/25/2017 8.9* 12.0 - 16.0 g/dL Final   ??? HCT 04/25/2017 29.7* 36 - 48 % Final   ??? MCV 04/25/2017 85.3  78 - 102 FL Final   ??? MCH 04/25/2017 25.6  25.0 - 35.0 PG Final   ??? MCHC 04/25/2017 30.0* 31 - 37 g/dL Final   ??? RDW 04/25/2017 14.5  11.5 - 14.5 % Final   ??? PLATELET 04/25/2017 75* 140 - 440 K/uL Final   ??? NEUTROPHILS 04/25/2017 77* 40 - 70 % Final   ??? MIXED CELLS 04/25/2017 10  0.1 - 17 % Final   ??? LYMPHOCYTES 04/25/2017 13* 14 - 44 % Final   ??? ABS. NEUTROPHILS 04/25/2017 2.5  1.8 - 9.5 K/UL Final   ??? ABS. MIXED CELLS 04/25/2017 0.3  0.0 - 2.3 K/uL Final   ??? ABS. LYMPHOCYTES 04/25/2017 0.4* 1.1 - 5.9 K/UL Final    Test performed at [place] Location. Results Reviewed by Medical Director.   ??? DF 04/25/2017 AUTOMATED    Final    Hospital Outpatient Visit on 04/11/2017   Component Date Value Ref Range Status   ??? WBC 04/11/2017 3.2* 4.5 - 13.0 K/uL Final   ??? RBC 04/11/2017 3.38* 4.10 - 5.10 M/uL Final   ??? HGB 04/11/2017 9.2* 12.0 - 16.0 g/dL Final   ??? HCT 04/11/2017 29.9* 36 - 48 % Final   ??? MCV 04/11/2017 88.5  78 - 102 FL Final   ??? MCH 04/11/2017 27.2  25.0 - 35.0 PG Final   ??? MCHC 04/11/2017 30.8* 31 - 37 g/dL Final   ??? RDW 04/11/2017 14.7* 11.5 - 14.5 % Final   ??? PLATELET 04/11/2017 93* 140 - 440 K/uL Final   ??? NEUTROPHILS 04/11/2017 76* 40 - 70 % Final   ??? MIXED CELLS 04/11/2017 10  0.1 - 17 % Final   ??? LYMPHOCYTES 04/11/2017 14  14 - 44 % Final   ??? ABS. NEUTROPHILS 04/11/2017 2.4  1.8 - 9.5 K/UL Final   ??? ABS. MIXED CELLS 04/11/2017 0.3  0.0 - 2.3 K/uL Final   ??? ABS. LYMPHOCYTES 04/11/2017 0.5* 1.1 - 5.9 K/UL Final    Test performed at [place] Location. Results Reviewed by Medical Director.   ??? DF 04/11/2017 AUTOMATED    Final   Hospital Outpatient Visit on 03/28/2017   Component Date Value Ref Range Status   ??? WBC 03/28/2017 3.6* 4.5 - 13.0 K/uL Final   ??? RBC 03/28/2017 3.57* 4.10 - 5.10 M/uL Final   ??? HGB 03/28/2017 10.0* 12.0 - 16.0 g/dL Final   ??? HCT 03/28/2017 32.2* 36 - 48 % Final   ??? MCV 03/28/2017 90.2  78 - 102 FL Final   ??? MCH 03/28/2017 28.0  25.0 - 35.0 PG Final   ??? MCHC 03/28/2017 31.1  31 - 37 g/dL Final   ??? RDW 03/28/2017 16.2* 11.5 - 14.5 % Final   ???  PLATELET 03/28/2017 102* 140 - 440 K/uL Final   ??? NEUTROPHILS 03/28/2017 78* 40 - 70 % Final   ??? MIXED CELLS 03/28/2017 10  0.1 - 17 % Final   ??? LYMPHOCYTES 03/28/2017 12* 14 - 44 % Final   ??? ABS. NEUTROPHILS 03/28/2017 2.8  1.8 - 9.5 K/UL Final   ??? ABS. MIXED CELLS 03/28/2017 0.4  0.0 - 2.3 K/uL Final   ??? ABS. LYMPHOCYTES 03/28/2017 0.4* 1.1 - 5.9 K/UL Final    Test performed at [place] Location. Results Reviewed by Medical Director.   ??? DF 03/28/2017 AUTOMATED    Final   Hospital Outpatient Visit on 03/07/2017    Component Date Value Ref Range Status   ??? WBC 03/07/2017 4.4* 4.5 - 13.0 K/uL Final   ??? RBC 03/07/2017 3.48* 4.10 - 5.10 M/uL Final   ??? HGB 03/07/2017 9.6* 12.0 - 16.0 g/dL Final   ??? HCT 03/07/2017 31.5* 36 - 48 % Final   ??? MCV 03/07/2017 90.5  78 - 102 FL Final   ??? MCH 03/07/2017 27.6  25.0 - 35.0 PG Final   ??? MCHC 03/07/2017 30.5* 31 - 37 g/dL Final   ??? RDW 03/07/2017 21.1* 11.5 - 14.5 % Final   ??? PLATELET 03/07/2017 102* 140 - 440 K/uL Final   ??? NEUTROPHILS 03/07/2017 77* 40 - 70 % Final   ??? MIXED CELLS 03/07/2017 11  0.1 - 17 % Final   ??? LYMPHOCYTES 03/07/2017 12* 14 - 44 % Final   ??? ABS. NEUTROPHILS 03/07/2017 3.4  1.8 - 9.5 K/UL Final   ??? ABS. MIXED CELLS 03/07/2017 0.5  0.0 - 2.3 K/uL Final   ??? ABS. LYMPHOCYTES 03/07/2017 0.5* 1.1 - 5.9 K/UL Final    Test performed at [place] Location. Results Reviewed by Medical Director.   ??? DF 03/07/2017 AUTOMATED    Final   Hospital Outpatient Visit on 02/23/2017   Component Date Value Ref Range Status   ??? Sodium 02/23/2017 141  136 - 145 mmol/L Final   ??? Potassium 02/23/2017 4.4  3.5 - 5.5 mmol/L Final   ??? Chloride 02/23/2017 108  100 - 108 mmol/L Final   ??? CO2 02/23/2017 21  21 - 32 mmol/L Final   ??? Anion gap 02/23/2017 12  3.0 - 18 mmol/L Final   ??? Glucose 02/23/2017 101* 74 - 99 mg/dL Final   ??? BUN 02/23/2017 13  7.0 - 18 MG/DL Final   ??? Creatinine 02/23/2017 1.02  0.6 - 1.3 MG/DL Final   ??? BUN/Creatinine ratio 02/23/2017 13  12 - 20   Final   ??? GFR est AA 02/23/2017 >60  >60 ml/min/1.43m Final   ??? GFR est non-AA 02/23/2017 >60  >60 ml/min/1.799mFinal    Comment: (NOTE)  Estimated GFR is calculated using the Modification of Diet in Renal   Disease (MDRD) Study equation, reported for both African Americans   (GFRAA) and non-African Americans (GFRNA), and normalized to 1.7346m body surface area. The physician must decide which value applies to   the patient. The MDRD study equation should only be used in    individuals age 61 41 older. It has not been validated for the   following: pregnant women, patients with serious comorbid conditions,   or on certain medications, or persons with extremes of body size,   muscle mass, or nutritional status.     ??? Calcium 02/23/2017 9.5  8.5 - 10.1 MG/DL Final   ??? Bilirubin,  total 02/23/2017 0.6  0.2 - 1.0 MG/DL Final   ??? ALT (SGPT) 02/23/2017 26  16 - 61 U/L Final   ??? AST (SGOT) 02/23/2017 35  15 - 37 U/L Final   ??? Alk. phosphatase 02/23/2017 200* 45 - 117 U/L Final   ??? Protein, total 02/23/2017 7.5  6.4 - 8.2 g/dL Final   ??? Albumin 02/23/2017 4.2  3.4 - 5.0 g/dL Final   ??? Globulin 02/23/2017 3.3  2.0 - 4.0 g/dL Final   ??? A-G Ratio 02/23/2017 1.3  0.8 - 1.7   Final   ??? Iron 02/23/2017 44* 50 - 175 ug/dL Final    Patients receiving metal-binding drugs (e.g. deferoxamine) may show spuriously depressed iron values, as chelated iron may not properly react in the iron assay.   ??? TIBC 02/23/2017 446  250 - 450 ug/dL Final   ??? Iron % saturation 02/23/2017 10  % Final   ??? Ferritin 02/23/2017 63  8 - 388 NG/ML Final   Hospital Outpatient Visit on 02/23/2017   Component Date Value Ref Range Status   ??? WBC 02/23/2017 3.3* 4.5 - 13.0 K/uL Final   ??? RBC 02/23/2017 3.76* 4.10 - 5.10 M/uL Final   ??? HGB 02/23/2017 10.3* 12.0 - 16.0 g/dL Final   ??? HCT 02/23/2017 34.0* 36 - 48 % Final   ??? MCV 02/23/2017 90.4  78 - 102 FL Final   ??? MCH 02/23/2017 27.4  25.0 - 35.0 PG Final   ??? MCHC 02/23/2017 30.3* 31 - 37 g/dL Final   ??? RDW 02/23/2017 22.1* 11.5 - 14.5 % Final   ??? PLATELET 02/23/2017 92* 140 - 440 K/uL Final   ??? NEUTROPHILS 02/23/2017 79* 40 - 70 % Final   ??? MIXED CELLS 02/23/2017 7  0.1 - 17 % Final   ??? LYMPHOCYTES 02/23/2017 14  14 - 44 % Final   ??? ABS. NEUTROPHILS 02/23/2017 2.6  1.8 - 9.5 K/UL Final   ??? ABS. MIXED CELLS 02/23/2017 0.2  0.0 - 2.3 K/uL Final   ??? ABS. LYMPHOCYTES 02/23/2017 0.5* 1.1 - 5.9 K/UL Final    Test performed at [place] Location. Results Reviewed  by Medical Director.   ??? DF 02/23/2017 AUTOMATED    Final          Follow-up Disposition: Not on File

## 2017-05-24 NOTE — Progress Notes (Signed)
William Blalock Adebayo Sr. is a 71 y.o. male presents to office for migraine and skin problem    Medication list has been reviewed with William Burger Sr. and updated as of today's date     Health Maintenance items with a due date reviewed with patient:  Health Maintenance Due   Topic Date Due   ??? Hepatitis C Screening  [birth year]   ??? FOOT EXAM Q1  03/19/1956   ??? MICROALBUMIN Q1  03/19/1958   ??? EYE EXAM RETINAL OR DILATED Q1  03/19/1958   ??? DTaP/Tdap/Td series (1 - Tdap) 03/19/1969   ??? Shingrix Vaccine Age 68> (1 of 2) 03/19/1998   ??? GLAUCOMA SCREENING Q2Y  03/19/2013   ??? Pneumococcal 65+ High/Highest Risk (1 of 2 - PCV13) 03/19/2013   ??? HEMOGLOBIN A1C Q6M  12/28/2016   ??? Influenza Age 70 to Adult  01/26/2017         William Burger Sr. is a 71 y.o. male who presents for routine immunizations.   He denies any symptoms , reactions or allergies that would exclude them from being immunized today.  Risks and adverse reactions were discussed and the VIS was given to them. All questions were addressed.  He was observed for  15 min [place] injection. There were no reactions observed.    William Post, LPN

## 2017-05-25 ENCOUNTER — Inpatient Hospital Stay: Admit: 2017-05-25 | Primary: Legal Medicine

## 2017-05-25 ENCOUNTER — Institutional Professional Consult (permissible substitution): Admit: 2017-05-25 | Discharge: 2017-05-25 | Payer: PRIVATE HEALTH INSURANCE | Primary: Legal Medicine

## 2017-05-25 ENCOUNTER — Inpatient Hospital Stay: Admit: 2017-05-25 | Payer: MEDICARE | Primary: Legal Medicine

## 2017-05-25 DIAGNOSIS — D508 Other iron deficiency anemias: Secondary | ICD-10-CM

## 2017-05-25 LAB — CBC WITH 3 PART DIFF
ABS. LYMPHOCYTES: 0.3 10*3/uL — ABNORMAL LOW (ref 1.1–5.9)
ABS. MIXED CELLS: 0.3 10*3/uL (ref 0.0–2.3)
ABS. NEUTROPHILS: 2.7 10*3/uL (ref 1.8–9.5)
HCT: 34.9 % — ABNORMAL LOW (ref 36–48)
HGB: 10.4 g/dL — ABNORMAL LOW (ref 12.0–16.0)
LYMPHOCYTES: 10 % — ABNORMAL LOW (ref 14–44)
MCH: 25.3 PG (ref 25.0–35.0)
MCHC: 29.8 g/dL — ABNORMAL LOW (ref 31–37)
MCV: 84.9 FL (ref 78–102)
Mixed cells: 9 % (ref 0.1–17)
NEUTROPHILS: 81 % — ABNORMAL HIGH (ref 40–70)
PLATELET: 101 10*3/uL — ABNORMAL LOW (ref 140–440)
RBC: 4.11 M/uL (ref 4.10–5.10)
RDW: 17.6 % — ABNORMAL HIGH (ref 11.5–14.5)
WBC: 3.3 10*3/uL — ABNORMAL LOW (ref 4.5–13.0)

## 2017-05-25 MED ORDER — EPOETIN ALFA 40,000 UNIT/ML IJ SOLN
40000 unit/mL | Freq: Once | INTRAMUSCULAR | Status: AC
Start: 2017-05-25 — End: 2017-05-26

## 2017-05-25 MED ORDER — EPOETIN ALFA 20,000 UNIT/ML IJ SOLN
20000 unit/mL | Freq: Once | INTRAMUSCULAR | Status: AC
Start: 2017-05-25 — End: 2017-05-26

## 2017-05-25 MED ORDER — SODIUM CHLORIDE 0.9 % IJ SYRG
INTRAMUSCULAR | Status: DC | PRN
Start: 2017-05-25 — End: 2017-05-29
  Administered 2017-05-25 (×2): via INTRAVENOUS

## 2017-05-25 MED ORDER — SODIUM CHLORIDE 0.9 % IV
INTRAVENOUS | Status: AC | PRN
Start: 2017-05-25 — End: 2017-05-26

## 2017-05-25 MED FILL — SODIUM CHLORIDE 0.9 % IV: INTRAVENOUS | Qty: 250

## 2017-05-25 MED FILL — BD POSIFLUSH NORMAL SALINE 0.9 % INJECTION SYRINGE: INTRAMUSCULAR | Qty: 40

## 2017-05-25 NOTE — Progress Notes (Signed)
[facility] OPIC Progress Note    Date: May 25, 2017    Name: [name] [facility].    MRN: [id number]         DOB: June 17, 1946      [name] arrived in the [facility] today at 1300, in stable condition, here for Q 2 Week CBC/Procrit injection and Dose # 2 of 4, IV Venofer Infusion. He was assessed and education was provided.     Mr. Ganoe vitals were reviewed.  Visit Vitals  BP 123/68 (BP 1 Location: Right arm, BP Patient Position: Sitting)   Pulse 67   Temp 98 ??F (36.7 ??C)   Resp 20         PIV was established in his right AC at 1315, without incident, and blood for a CBC was drawn, per order,           Lab results were obtained and reviewed.  Recent Results (from the past 12 hour(s))   CBC WITH 3 PART DIFF    Collection Time: 05/25/17  1:15 PM   Result Value Ref Range    WBC 3.3 (L) 4.5 - 13.0 K/uL    RBC 4.11 4.10 - 5.10 M/uL    HGB 10.4 (L) 12.0 - 16.0 g/dL    HCT 34.9 (L) 36 - 48 %    MCV 84.9 78 - 102 FL    MCH 25.3 25.0 - 35.0 PG    MCHC 29.8 (L) 31 - 37 g/dL    RDW 17.6 (H) 11.5 - 14.5 %    PLATELET 101 (L) 140 - 440 K/uL    NEUTROPHILS 81 (H) 40 - 70 %    MIXED CELLS 9 0.1 - 17 %    LYMPHOCYTES 10 (L) 14 - 44 %    ABS. NEUTROPHILS 2.7 1.8 - 9.5 K/UL    ABS. MIXED CELLS 0.3 0.0 - 2.3 K/uL    ABS. LYMPHOCYTES 0.3 (L) 1.1 - 5.9 K/UL    DF AUTOMATED             Procrit injection was HELD today, per order.      Venofer (Iron Sucrose) 250 mg IV, was infused over approximately 90 minutes, per order, and without incident.       Mr. Weatherall politely declined to stay for his 30 minute post Venofer completion monitoring period, and therefore, once the Venofer was completed, the PIV was flushed well with NS, and then removed and gauze/bandaid was applied.              Mr. Wien tolerated well, and had no complaints.    Mr. Rathert was discharged from [place] in stable condition at 1515.Marland Kitchen He is to return in 2 weeks, on Wednesday, 06-08-17, at  1300,  for his next appointment, for CBC/Procrit injection & Dose # 3 of 4, IV Venofer Infusion.     [name], RN  May 25, 2017  1:49 PM

## 2017-05-30 ENCOUNTER — Encounter

## 2017-05-30 NOTE — Telephone Encounter (Signed)
Pt's wife would like a call regarding her husband's migraines.    Medication ( Sumatriptan) is not working. Can we try a another medication.     Please advise.

## 2017-05-31 ENCOUNTER — Encounter: Payer: MEDICARE | Primary: Legal Medicine

## 2017-05-31 NOTE — Telephone Encounter (Signed)
Pt's wife called again. Please call ASAP

## 2017-06-01 MED ORDER — IRON SUCROSE 100 MG/5 ML IV SOLN
100 mg iron/5 mL | Freq: Once | INTRAVENOUS | Status: AC
Start: 2017-06-01 — End: 2017-06-08
  Administered 2017-06-08: 18:00:00 via INTRAVENOUS

## 2017-06-01 MED FILL — VENOFER 100 MG IRON/5 ML INTRAVENOUS SOLUTION: 100 mg iron/5 mL | INTRAVENOUS | Qty: 12.5

## 2017-06-02 ENCOUNTER — Encounter

## 2017-06-02 NOTE — Telephone Encounter (Signed)
I have spoke to Mrs. rachal and advised her if the headache is worse and is not improving by sumatriptan they need to go to emergency room for evaluation CT scan and possible MRI.    Meanwhile we will put an urgent referral for neurology evaluation since the patient needs to follow-up with neurologist and received Botox injection for his headache in the past.

## 2017-06-03 MED ORDER — SUCRALFATE 1 GRAM TAB
1 gram | ORAL_TABLET | ORAL | 0 refills | Status: DC
Start: 2017-06-03 — End: 2017-06-30

## 2017-06-03 MED ORDER — SUMATRIPTAN 100 MG TAB
100 mg | ORAL_TABLET | ORAL | 0 refills | Status: DC
Start: 2017-06-03 — End: 2017-06-10

## 2017-06-06 ENCOUNTER — Encounter: Payer: MEDICARE | Primary: Legal Medicine

## 2017-06-08 ENCOUNTER — Inpatient Hospital Stay: Admit: 2017-06-08 | Payer: MEDICARE | Primary: Legal Medicine

## 2017-06-08 ENCOUNTER — Institutional Professional Consult (permissible substitution): Admit: 2017-06-08 | Discharge: 2017-06-08 | Payer: PRIVATE HEALTH INSURANCE | Primary: Legal Medicine

## 2017-06-08 ENCOUNTER — Inpatient Hospital Stay: Admit: 2017-06-08 | Primary: Legal Medicine

## 2017-06-08 DIAGNOSIS — D509 Iron deficiency anemia, unspecified: Secondary | ICD-10-CM

## 2017-06-08 LAB — CBC WITH 3 PART DIFF
ABS. LYMPHOCYTES: 0.4 10*3/uL — ABNORMAL LOW (ref 1.1–5.9)
ABS. MIXED CELLS: 0.3 10*3/uL (ref 0.0–2.3)
ABS. NEUTROPHILS: 2.5 10*3/uL (ref 1.8–9.5)
HCT: 31.6 % — ABNORMAL LOW (ref 36–48)
HGB: 9.4 g/dL — ABNORMAL LOW (ref 12.0–16.0)
LYMPHOCYTES: 12 % — ABNORMAL LOW (ref 14–44)
MCH: 26 PG (ref 25.0–35.0)
MCHC: 29.7 g/dL — ABNORMAL LOW (ref 31–37)
MCV: 87.5 FL (ref 78–102)
Mixed cells: 8 % (ref 0.1–17)
NEUTROPHILS: 80 % — ABNORMAL HIGH (ref 40–70)
PLATELET: 88 10*3/uL — ABNORMAL LOW (ref 140–440)
RBC: 3.61 M/uL — ABNORMAL LOW (ref 4.10–5.10)
RDW: 19.1 % — ABNORMAL HIGH (ref 11.5–14.5)
WBC: 3.2 10*3/uL — ABNORMAL LOW (ref 4.5–13.0)

## 2017-06-08 NOTE — Progress Notes (Signed)
[facility] OPIC Progress Note    Date: June 08, 2017    Name: William Blanchard [facility] Sr.    MRN: [id number]         DOB: [birth year]     IV venofer/CBC    [name] arrived in the [place] today at 62, in stable condition. He was assessed and education was provided.     [name] vitals were reviewed.  Visit Vitals  BP 114/65 (BP 1 Location: Right arm, BP Patient Position: Sitting)   Pulse 67   Temp 97.9 ??F (36.6 ??C)   Resp 18   SpO2 98%         24 G PIV was established in his right AC, without incident, and blood for a CBC was drawn, per order.    Lab results were obtained and reviewed.  Recent Results (from the past 12 hour(s))   CBC WITH 3 PART DIFF    Collection Time: 06/08/17  1:24 PM   Result Value Ref Range    WBC 3.2 (L) 4.5 - 13.0 K/uL    RBC 3.61 (L) 4.10 - 5.10 M/uL    HGB 9.4 (L) 12.0 - 16.0 g/dL    HCT 31.6 (L) 36 - 48 %    MCV 87.5 78 - 102 FL    MCH 26.0 25.0 - 35.0 PG    MCHC 29.7 (L) 31 - 37 g/dL    RDW 19.1 (H) 11.5 - 14.5 %    PLATELET 88 (L) 140 - 440 K/uL    NEUTROPHILS 80 (H) 40 - 70 %    MIXED CELLS 8 0.1 - 17 %    LYMPHOCYTES 12 (L) 14 - 44 %    ABS. NEUTROPHILS 2.5 1.8 - 9.5 K/UL    ABS. MIXED CELLS 0.3 0.0 - 2.3 K/uL    ABS. LYMPHOCYTES 0.4 (L) 1.1 - 5.9 K/UL    DF AUTOMATED          H/H 9.4/31.6    Procrit injection was HELD today, per order.      Venofer (Iron Sucrose) 250 mg IV, was infused over approximately 90 minutes, per order, and without incident.       [name] politely declined to stay for his 30 minute post Venofer completion monitoring period, and therefore, once the Venofer was completed, the PIV was flushed well with NS, and then removed and gauze/bandaid was applied.        [name] tolerated well, and had no complaints.    [name] was discharged from [place] in stable condition at 1515. He is to return in 2 weeks, on Wednesday, 06-22-17, at 1300,  for his next appointment, for CBC/Procrit injection &  IV Venofer Infusion.     Pryor Montes, RN   June 08, 2017

## 2017-06-10 ENCOUNTER — Encounter

## 2017-06-10 NOTE — Telephone Encounter (Signed)
Pt's wife called regarding neuro referral. Stated Dr. Dicky Doe @  [facility] can not see pt until March. Requesting referral be sent to another facility. In the meantime, requesting refill.     Requested Prescriptions     Pending Prescriptions Disp Refills   ??? SUMAtriptan (IMITREX) 100 mg tablet 10 Tab 0

## 2017-06-10 NOTE — Telephone Encounter (Signed)
Pt's wife called regarding neuro referral. Stated Dr. Dicky Doe @  [facility] can not see pt until March. Requesting referral be sent to another facility.

## 2017-06-11 MED ORDER — SUMATRIPTAN 100 MG TAB
100 mg | ORAL_TABLET | ORAL | 0 refills | Status: DC
Start: 2017-06-11 — End: 2017-07-13

## 2017-06-13 NOTE — Progress Notes (Signed)
Chart Audit for Suspect Conditions completed.

## 2017-06-15 MED ORDER — IRON SUCROSE 100 MG/5 ML IV SOLN
1005 mg iron/5 mL | Freq: Once | INTRAVENOUS | Status: AC
Start: 2017-06-15 — End: 2017-06-22
  Administered 2017-06-22: 19:00:00 via INTRAVENOUS

## 2017-06-15 MED FILL — VENOFER 100 MG IRON/5 ML INTRAVENOUS SOLUTION: 100 mg iron/5 mL | INTRAVENOUS | Qty: 12.5

## 2017-06-17 NOTE — Progress Notes (Signed)
[facility] health screening:    Unsuccessful in getting William Blanchard to complete his colonoscopy post positive fit. Reasons given by him and his wife: "too many other pressing physical ailments to tend with." Closed this episode of care.

## 2017-06-19 ENCOUNTER — Encounter

## 2017-06-22 ENCOUNTER — Inpatient Hospital Stay: Admit: 2017-06-22 | Primary: Legal Medicine

## 2017-06-22 ENCOUNTER — Inpatient Hospital Stay: Admit: 2017-06-22 | Payer: MEDICARE | Primary: Legal Medicine

## 2017-06-22 ENCOUNTER — Institutional Professional Consult (permissible substitution): Admit: 2017-06-22 | Discharge: 2017-06-22 | Payer: PRIVATE HEALTH INSURANCE | Primary: Legal Medicine

## 2017-06-22 DIAGNOSIS — D508 Other iron deficiency anemias: Secondary | ICD-10-CM

## 2017-06-22 LAB — CBC WITH 3 PART DIFF
ABS. LYMPHOCYTES: 0.5 10*3/uL — ABNORMAL LOW (ref 1.1–5.9)
ABS. MIXED CELLS: 0.4 10*3/uL (ref 0.0–2.3)
ABS. NEUTROPHILS: 2.5 10*3/uL (ref 1.8–9.5)
HCT: 32.2 % — ABNORMAL LOW (ref 36–48)
HGB: 9.6 g/dL — ABNORMAL LOW (ref 12.0–16.0)
LYMPHOCYTES: 14 % (ref 14–44)
MCH: 26.7 PG (ref 25.0–35.0)
MCHC: 29.8 g/dL — ABNORMAL LOW (ref 31–37)
MCV: 89.4 FL (ref 78–102)
Mixed cells: 13 % (ref 0.1–17)
NEUTROPHILS: 74 % — ABNORMAL HIGH (ref 40–70)
PLATELET: 87 10*3/uL — ABNORMAL LOW (ref 140–440)
RBC: 3.6 M/uL — ABNORMAL LOW (ref 4.10–5.10)
RDW: 20 % — ABNORMAL HIGH (ref 11.5–14.5)
WBC: 3.4 10*3/uL — ABNORMAL LOW (ref 4.5–13.0)

## 2017-06-22 MED ORDER — SODIUM CHLORIDE 0.9 % IJ SYRG
INTRAMUSCULAR | Status: DC | PRN
Start: 2017-06-22 — End: 2017-06-26

## 2017-06-22 MED FILL — BD POSIFLUSH NORMAL SALINE 0.9 % INJECTION SYRINGE: INTRAMUSCULAR | Qty: 40

## 2017-06-22 NOTE — Progress Notes (Signed)
[facility] OPIC Progress Note    Date: June 22, 2017    Name: William Blanchard [facility] Sr.    MRN: [id number]         DOB: 08/15/1945      [name] was assessed and education was provided.     [name] vitals were reviewed and patient was observed for 5 minutes prior to treatment.   There were no vitals taken for this visit.    Lab results were obtained and reviewed.  Recent Results (from the past 12 hour(s))   CBC WITH 3 PART DIFF    Collection Time: 06/22/17  1:52 PM   Result Value Ref Range    WBC 3.4 (L) 4.5 - 13.0 K/uL    RBC 3.60 (L) 4.10 - 5.10 M/uL    HGB 9.6 (L) 12.0 - 16.0 g/dL    HCT 32.2 (L) 36 - 48 %    MCV 89.4 78 - 102 FL    MCH 26.7 25.0 - 35.0 PG    MCHC 29.8 (L) 31 - 37 g/dL    RDW 20.0 (H) 11.5 - 14.5 %    PLATELET 87 (L) 140 - 440 K/uL    NEUTROPHILS 74 (H) 40 - 70 %    MIXED CELLS 13 0.1 - 17 %    LYMPHOCYTES 14 14 - 44 %    ABS. NEUTROPHILS 2.5 1.8 - 9.5 K/UL    ABS. MIXED CELLS 0.4 0.0 - 2.3 K/uL    ABS. LYMPHOCYTES 0.5 (L) 1.1 - 5.9 K/UL    DF AUTOMATED         Venofer 250 mg IV given as ordered via peripheral line after brisk blood return obtained.    [name] tolerated the infusion, and had no complaints.  Patient armband removed and shredded.    [name] was discharged from [place] in stable condition at 1600. He is to return on 07/06/17 at 1300 for his next appointment.    Lynnda Shields, RN  June 22, 2017

## 2017-06-23 ENCOUNTER — Encounter

## 2017-06-29 ENCOUNTER — Encounter

## 2017-06-29 MED ORDER — TOPIRAMATE 100 MG TAB
100 mg | ORAL_TABLET | ORAL | 0 refills | Status: DC
Start: 2017-06-29 — End: 2017-09-25

## 2017-06-30 ENCOUNTER — Encounter

## 2017-06-30 ENCOUNTER — Encounter: Attending: Hematology & Oncology | Primary: Legal Medicine

## 2017-07-01 MED ORDER — CITALOPRAM 40 MG TAB
40 mg | ORAL_TABLET | ORAL | 0 refills | Status: DC
Start: 2017-07-01 — End: 2017-09-25

## 2017-07-01 MED ORDER — SUCRALFATE 1 GRAM TAB
1 gram | ORAL_TABLET | ORAL | 0 refills | Status: DC
Start: 2017-07-01 — End: 2017-08-01

## 2017-07-04 MED FILL — VENOFER 100 MG IRON/5 ML INTRAVENOUS SOLUTION: 100 mg iron/5 mL | INTRAVENOUS | Qty: 50

## 2017-07-06 ENCOUNTER — Inpatient Hospital Stay: Admit: 2017-07-06 | Primary: Legal Medicine

## 2017-07-06 ENCOUNTER — Ambulatory Visit
Admit: 2017-07-06 | Discharge: 2017-07-06 | Payer: PRIVATE HEALTH INSURANCE | Attending: Hematology & Oncology | Primary: Legal Medicine

## 2017-07-06 ENCOUNTER — Institutional Professional Consult (permissible substitution): Admit: 2017-07-06 | Discharge: 2017-07-06 | Payer: PRIVATE HEALTH INSURANCE | Primary: Legal Medicine

## 2017-07-06 ENCOUNTER — Inpatient Hospital Stay: Admit: 2017-07-06 | Payer: MEDICARE | Primary: Legal Medicine

## 2017-07-06 DIAGNOSIS — D509 Iron deficiency anemia, unspecified: Secondary | ICD-10-CM

## 2017-07-06 DIAGNOSIS — D696 Thrombocytopenia, unspecified: Secondary | ICD-10-CM

## 2017-07-06 DIAGNOSIS — D508 Other iron deficiency anemias: Secondary | ICD-10-CM

## 2017-07-06 LAB — CBC WITH 3 PART DIFF
ABS. LYMPHOCYTES: 0.3 10*3/uL — ABNORMAL LOW (ref 1.1–5.9)
ABS. MIXED CELLS: 0.3 10*3/uL (ref 0.0–2.3)
ABS. NEUTROPHILS: 2.2 10*3/uL (ref 1.8–9.5)
HCT: 33.7 % — ABNORMAL LOW (ref 36–48)
HGB: 10.3 g/dL — ABNORMAL LOW (ref 12.0–16.0)
LYMPHOCYTES: 12 % — ABNORMAL LOW (ref 14–44)
MCH: 27.6 PG (ref 25.0–35.0)
MCHC: 30.6 g/dL — ABNORMAL LOW (ref 31–37)
MCV: 90.3 FL (ref 78–102)
Mixed cells: 10 % (ref 0.1–17)
NEUTROPHILS: 78 % — ABNORMAL HIGH (ref 40–70)
PLATELET: 82 10*3/uL — ABNORMAL LOW (ref 140–440)
RBC: 3.73 M/uL — ABNORMAL LOW (ref 4.10–5.10)
RDW: 20 % — ABNORMAL HIGH (ref 11.5–14.5)
WBC: 2.8 10*3/uL — ABNORMAL LOW (ref 4.5–13.0)

## 2017-07-06 NOTE — Progress Notes (Signed)
[facility] OPIC Progress Note    Date: July 06, 2017    Name: William Blanchard [facility] Sr.    MRN: [id number]         DOB: [birth year]    Procrit/CBC    [name] was assessed and education was provided.     [name] vitals were reviewed and patient was observed for 5 minutes prior to treatment.   Visit Vitals  BP 111/62 (BP 1 Location: Right arm, BP Patient Position: Sitting)   Pulse 61   Temp 98 ??F (36.7 ??C)   Resp 18   SpO2 97%       Lab results were obtained and reviewed.  Recent Results (from the past 12 hour(s))   CBC WITH 3 PART DIFF    Collection Time: 07/06/17  1:36 PM   Result Value Ref Range    WBC 2.8 (L) 4.5 - 13.0 K/uL    RBC 3.73 (L) 4.10 - 5.10 M/uL    HGB 10.3 (L) 12.0 - 16.0 g/dL    HCT 33.7 (L) 36 - 48 %    MCV 90.3 78 - 102 FL    MCH 27.6 25.0 - 35.0 PG    MCHC 30.6 (L) 31 - 37 g/dL    RDW 20.0 (H) 11.5 - 14.5 %    PLATELET 82 (L) 140 - 440 K/uL    NEUTROPHILS 78 (H) 40 - 70 %    MIXED CELLS 10 0.1 - 17 %    LYMPHOCYTES 12 (L) 14 - 44 %    ABS. NEUTROPHILS 2.2 1.8 - 9.5 K/UL    ABS. MIXED CELLS 0.3 0.0 - 2.3 K/uL    ABS. LYMPHOCYTES 0.3 (L) 1.1 - 5.9 K/UL    DF AUTOMATED         Procrit held per parameters. Hgb 10.3,Hct 33.7.     Patient armband removed and shredded.    [name] was discharged from [facility] [place] in stable condition at . He is to return on 07/20/2017 at 1500 for his next appointment for CBC/Procrit Q 2 wks.    Colbert Ewing, RN  July 06, 2017  1350

## 2017-07-06 NOTE — Progress Notes (Signed)
Hematology/Oncology  Progress Note  Name: William Egolf Sr.  Date: 07/06/2017  DOB: 02-22-1946    PCP: [name], MD     William Blanchard is a 72 y.o.-year-old man who has myelodysplastic syndrome, chronic leukopenia, and severe anemia.  Current therapy: Procrit 60,000 units every 2 weeks when the hemoglobin is below 10 g/dL.    Subjective:     William Blanchard is a 72 year old man who has myelodysplastic syndrome with severe anemia.  He also has chronic leukopenia and thrombocytopenia.  He is currently receiving Procrit 60,000 units every 2 weeks.  Today he is complaining of some fatigue and weakness.  His wife reports that although he is eating well he is continuing to lose weight and he is hypersomnolent, sleeping all of the time.  There are no new issues.  Since his last clinic visit he has received 4 doses of intravenous Venofer.    Past medical history, family history, and social history: these were reviewed and remains unchanged.    Past Medical History:   Diagnosis Date   ??? Anemia    ??? Anxiety    ??? Chronic pain    ??? Cirrhosis of liver ([place])    ??? Diabetes ([place])    ??? GERD (gastroesophageal reflux disease)    ??? Hyperlipemia    ??? Hypertension    ??? Hypotension    ??? Migraine    ??? Other cirrhosis of liver ([place])    ??? Stroke [facility])     had 3 strokes    ??? Thrombocytopenia ([place])      Past Surgical History:   Procedure Laterality Date   ??? HX CHOLECYSTECTOMY     ??? HX ORTHOPAEDIC      R hand sx   ??? HX OTHER SURGICAL      Hand surgery- Nail gun went through finger     Social History     Socioeconomic History   ??? Marital status: MARRIED     Spouse name: Not on file   ??? Number of children: Not on file   ??? Years of education: Not on file   ??? Highest education level: Not on file   Social Needs   ??? Financial resource strain: Not on file   ??? Food insecurity - worry: Not on file   ??? Food insecurity - inability: Not on file   ??? Transportation needs - medical: Not on file   ??? Transportation needs - non-medical: Not on file    Occupational History   ??? Not on file   Tobacco Use   ??? Smoking status: Former Smoker   ??? Smokeless tobacco: Never Used   ??? Tobacco comment: quit years ago   Substance and Sexual Activity   ??? Alcohol use: No   ??? Drug use: No   ??? Sexual activity: Yes     Partners: Female, Male   Other Topics Concern   ??? Not on file   Social History Narrative    ** Merged History Encounter **          Family History   Problem Relation Age of Onset   ??? Alzheimer Mother    ??? Diabetes Mother    ??? Cancer Mother         colon cancer    ??? Hypertension Father    ??? Heart Disease Father    ??? Cancer Father         prostate, lung cancer    ??? Diabetes Sister    ???  Heart Disease Sister    ??? Diabetes Brother      Current Outpatient Medications   Medication Sig Dispense Refill   ??? citalopram (CELEXA) 40 mg tablet TAKE 1 TABLET BY MOUTH DAILY 90 Tab 0   ??? sucralfate (CARAFATE) 1 gram tablet TAKE 1 TABLET BY MOUTH FOUR TIMES DAILY WITH MEALS AND AT BEDTIME 120 Tab 0   ??? topiramate (TOPAMAX) 100 mg tablet TAKE 1 TABLET BY MOUTH DAILY 90 Tab 0   ??? SUMAtriptan (IMITREX) 100 mg tablet TAKE 1 TABLET BY MOUTH ONCE AS NEEDED FOR MIGRAINE FOR UP TO 1 DOSE 10 Tab 0   ??? LORazepam (ATIVAN) 1 mg tablet Take 1 Tab by mouth every eight (8) hours as needed for Anxiety for up to 90 days. Max Daily Amount: 3 mg. 90 Tab 0   ??? metFORMIN (GLUCOPHAGE) 500 mg tablet TAKE 2 TABLETS BY MOUTH TWICE DAILY WITH MEALS(GENERIC FOR GLUCOPHAGE) 360 Tab 0   ??? rosuvastatin (CRESTOR) 10 mg tablet TAKE 1 TABLET BY MOUTH NIGHTLY 90 Tab 0   ??? butalbital-acetaminophen-caffeine (FIORICET, ESGIC) 50-325-40 mg per tablet Take 1 Tab by mouth every six (6) hours as needed for Pain. 20 Tab 1   ??? finasteride (PROSCAR) 5 mg tablet Take 1 Tab by mouth daily. 30 Tab 2   ??? HUMULIN R REGULAR U-100 INSULN 100 unit/mL injection As needed per sliding scale 1 Vial 6   ??? Insulin Syringe-Needle U-100 1 mL 29 gauge x 1/2" syrg Three times per day and as needed as per sliding scale 100 Syringe 6    ??? propranolol (INDERAL) 20 mg tablet Take 1 Tab by mouth three (3) times daily. (Patient taking differently: Take 20 mg by mouth two (2) times a day.) 90 Tab 2   ??? prochlorperazine (COMPAZINE) 10 mg tablet Take 0.5 Tabs by mouth every six (6) hours as needed. 30 Tab 5   ??? gemfibrozil (LOPID) 600 mg tablet TAKE 1 TABLET BY MOUTH TWICE DAILY 180 Tab 3   ??? ketoconazole (NIZORAL) 2 % topical cream Apply  to affected area daily. 15 g 0   ??? metaxalone (SKELAXIN) 800 mg tablet Take 800 mg by mouth three (3) times daily as needed.     ??? augmented betamethasone dipropionate (DIPROLENE-AF) 0.05 % ointment Apply  to affected area two (2) times a day.     ??? naloxone (NARCAN) 4 mg/actuation nasal spray Use 1 spray intranasally into 1 nostril. Use a new Narcan nasal spray for subsequent doses and administer into alternating nostrils. May repeat every 2 to 3 minutes as needed for opioid overdose symptoms. 1 Each 0   ??? oxyCODONE IR (ROXICODONE) 20 mg immediate release tablet TK 1 T PO Q 6 HOURS  0   ??? aspirin 81 mg chewable tablet Take 81 mg by mouth daily.     ??? cholecalciferol, vitamin D3, (VITAMIN D3) 2,000 unit tab Take  by mouth.     ??? B.infantis-B.ani-B.long-B.bifi (PROBIOTIC 4X) 10-15 mg TbEC Take  by mouth.     ??? lactulose (CHRONULAC) 10 gram/15 mL solution Take  by mouth three (3) times daily.     ??? cyclobenzaprine (FLEXERIL) 10 mg tablet Take  by mouth three (3) times daily as needed for Muscle Spasm(s).         Review of Systems  Constitutional: The patient has no acute distress or discomfort.  HEENT: The patient denies recent head trauma, eye pain, blurred vision,  hearing deficit, oropharyngeal mucosal pain or lesions, and  the patient denies throat pain or discomfort.  Lymphatics: The patient denies palpable peripheral lymphadenopathy.  Hematologic: The patient denies having bruising, bleeding, or progressive fatigue.  Respiratory: Patient denies having shortness of breath, cough, sputum  production, fever, or dyspnea on exertion.  Cardiovascular: The patient denies having leg pain, leg swelling, heart palpitations, chest permit, chest pain, or lightheadedness.  The patient denies having dyspnea on exertion.  Gastrointestinal: The patient denies having nausea, emesis, or diarrhea. The patient denies having any hematemesis or blood in the stool.  Genitourinary: Patient denies having urinary urgency, frequency, or dysuria.  The patient denies having blood in the urine.  Psychological: The patient denies having symptoms of nervousness, anxiety, depression, or thoughts of harming self.  Skin: Patient denies having skin rashes, skin, ulcerations, or unexplained itching or pruritus.  Musculoskeletal: The patient denies having pain in the joints or bones.      Objective:     Visit Vitals  BP 111/62   Pulse 61   Temp 98 ??F (36.7 ??C) (Oral)   Resp 18   Ht 5\' 9"  (1.753 m)   Wt 60.6 kg (133 lb 8 oz)   SpO2 97%   BMI 19.71 kg/m??     ECOG PS=0  Physical Exam:   Gen. Appearance: The patient is in no acute distress.  Skin: There is no bruise or rash.  HEENT: The exam is unremarkable.  Neck: Supple without lymphadenopathy or thyromegaly.  Lungs: Clear to auscultation and percussion; there are no wheezes or rhonchi.  Heart: Regular rate and rhythm; there are no murmurs, gallops, or rubs.  Abdomen: Bowel sounds are present and normal.  There is no guarding, tenderness, or hepatosplenomegaly.  Extremities: There is no clubbing, cyanosis, or edema.  Neurologic: There are no focal neurologic deficits.  Lymphatics: There is no palpable peripheral lymphadenopathy. Musculoskeletal: The patient has full range of motion at all joints.  There is no evidence of joint deformity or effusions.  There is no focal joint tenderness.  Psychological/psychiatric: There is no clinical evidence of anxiety, depression, or melancholy.    Lab data:      Results for orders placed or performed during the [facility] encounter of 07/06/17    CBC WITH 3 PART DIFF     Status: Abnormal   Result Value Ref Range Status    WBC 2.8 (L) 4.5 - 13.0 K/uL Final    RBC 3.73 (L) 4.10 - 5.10 M/uL Final    HGB 10.3 (L) 12.0 - 16.0 g/dL Final    HCT 33.7 (L) 36 - 48 % Final    MCV 90.3 78 - 102 FL Final    MCH 27.6 25.0 - 35.0 PG Final    MCHC 30.6 (L) 31 - 37 g/dL Final    RDW 20.0 (H) 11.5 - 14.5 % Final    PLATELET 82 (L) 140 - 440 K/uL Final    NEUTROPHILS 78 (H) 40 - 70 % Final    MIXED CELLS 10 0.1 - 17 % Final    LYMPHOCYTES 12 (L) 14 - 44 % Final    ABS. NEUTROPHILS 2.2 1.8 - 9.5 K/UL Final    ABS. MIXED CELLS 0.3 0.0 - 2.3 K/uL Final    ABS. LYMPHOCYTES 0.3 (L) 1.1 - 5.9 K/UL Final     Comment: Test performed at [facility]. Results Reviewed by Medical Director.    DF AUTOMATED   Final           Assessment:  1. Thrombocytopenia ([place])    2. Myelodysplastic syndrome ([place])    3. Iron deficiency anemia secondary to inadequate dietary iron intake      Plan:   Myelodysplastic syndrome, refractory anemia due to myelodysplastic syndrome/iron deficiency anemia: Have explained to the patient that his hemoglobin today is 10.3 g/dL with hematocrit of 33.7 %.  He did receive Procrit at a dose of 60,000 units yesterday.  I will check his iron profile and ferritin levels at this time.  If his ferritin level is below 100 and we will need to give him intravenous iron to replenish his ferritin level.    Chronic leukopenia: The CBC from today shows that his WBC count is 2.8 with an absolute neutrophil count which is normal at 2.2.  However the absolute lymphocyte count is low at 0.5.  These will continue to be monitored.  Therapeutic intervention is not warranted.    Thrombocytopenia: The patient has a stable lytic count of 82,000.  No therapeutic intervention is warranted.    Follow-up in 2 months  Orders Placed This Encounter   ??? METABOLIC PANEL, COMPREHENSIVE     Standing Status:   Future     Standing Expiration Date:   07/07/2018   ??? IRON PROFILE      Standing Status:   Future     Standing Expiration Date:   07/07/2018   ??? FERRITIN     Standing Status:   Future     Standing Expiration Date:   07/07/2018       Suzy Bouchard, MD  07/06/2017      Please note: This document has been produced using voice recognition software.  Unrecognized errors in transcription may be present.                                                                                    Hematology/Oncology  Progress Note  Name: William Bufford Sr.  Date: 07/06/2017  DOB: 1945/11/05    PCP: [name], MD     Mr. Eckardt is a 72 y.o.-year-old man who has myelodysplastic syndrome, chronic leukopenia, and severe anemia.  Current therapy: Procrit 60,000 units every 2 weeks when the hemoglobin is below 10 g/dL.    Subjective:     Mr. Bognar is a 72 year old man who has myelodysplastic syndrome with severe anemia.  He also has chronic leukopenia and thrombocytopenia.  He is currently receiving Procrit 60,000 units every 2 weeks.  Today he is complaining of some fatigue and weakness.  His wife reports that although he is eating well he is continuing to lose weight and he is hypersomnolent, sleeping all of the time.    Past medical history, family history, and social history: these were reviewed and remains unchanged.    Past Medical History:   Diagnosis Date   ??? Anemia    ??? Anxiety    ??? Chronic pain    ??? Cirrhosis of liver ([place])    ??? Diabetes ([place])    ??? GERD (gastroesophageal reflux disease)    ??? Hyperlipemia    ??? Hypertension    ??? Hypotension    ??? Migraine    ??? Other  cirrhosis of liver ([place])    ??? Stroke [facility])     had 3 strokes    ??? Thrombocytopenia ([place])      Past Surgical History:   Procedure Laterality Date   ??? HX CHOLECYSTECTOMY     ??? HX ORTHOPAEDIC      R hand sx   ??? HX OTHER SURGICAL      Hand surgery- Nail gun went through finger     Social History     Socioeconomic History   ??? Marital status: MARRIED     Spouse name: Not on file   ??? Number of children: Not on file    ??? Years of education: Not on file   ??? Highest education level: Not on file   Social Needs   ??? Financial resource strain: Not on file   ??? Food insecurity - worry: Not on file   ??? Food insecurity - inability: Not on file   ??? Transportation needs - medical: Not on file   ??? Transportation needs - non-medical: Not on file   Occupational History   ??? Not on file   Tobacco Use   ??? Smoking status: Former Smoker   ??? Smokeless tobacco: Never Used   ??? Tobacco comment: quit years ago   Substance and Sexual Activity   ??? Alcohol use: No   ??? Drug use: No   ??? Sexual activity: Yes     Partners: Female, Male   Other Topics Concern   ??? Not on file   Social History Narrative    ** Merged History Encounter **          Family History   Problem Relation Age of Onset   ??? Alzheimer Mother    ??? Diabetes Mother    ??? Cancer Mother         colon cancer    ??? Hypertension Father    ??? Heart Disease Father    ??? Cancer Father         prostate, lung cancer    ??? Diabetes Sister    ??? Heart Disease Sister    ??? Diabetes Brother      Current Outpatient Medications   Medication Sig Dispense Refill   ??? citalopram (CELEXA) 40 mg tablet TAKE 1 TABLET BY MOUTH DAILY 90 Tab 0   ??? sucralfate (CARAFATE) 1 gram tablet TAKE 1 TABLET BY MOUTH FOUR TIMES DAILY WITH MEALS AND AT BEDTIME 120 Tab 0   ??? topiramate (TOPAMAX) 100 mg tablet TAKE 1 TABLET BY MOUTH DAILY 90 Tab 0   ??? SUMAtriptan (IMITREX) 100 mg tablet TAKE 1 TABLET BY MOUTH ONCE AS NEEDED FOR MIGRAINE FOR UP TO 1 DOSE 10 Tab 0   ??? LORazepam (ATIVAN) 1 mg tablet Take 1 Tab by mouth every eight (8) hours as needed for Anxiety for up to 90 days. Max Daily Amount: 3 mg. 90 Tab 0   ??? metFORMIN (GLUCOPHAGE) 500 mg tablet TAKE 2 TABLETS BY MOUTH TWICE DAILY WITH MEALS(GENERIC FOR GLUCOPHAGE) 360 Tab 0   ??? rosuvastatin (CRESTOR) 10 mg tablet TAKE 1 TABLET BY MOUTH NIGHTLY 90 Tab 0   ??? butalbital-acetaminophen-caffeine (FIORICET, ESGIC) 50-325-40 mg per  tablet Take 1 Tab by mouth every six (6) hours as needed for Pain. 20 Tab 1   ??? finasteride (PROSCAR) 5 mg tablet Take 1 Tab by mouth daily. 30 Tab 2   ??? HUMULIN R REGULAR U-100 INSULN 100 unit/mL injection As needed per sliding scale 1 Vial  6   ??? Insulin Syringe-Needle U-100 1 mL 29 gauge x 1/2" syrg Three times per day and as needed as per sliding scale 100 Syringe 6   ??? propranolol (INDERAL) 20 mg tablet Take 1 Tab by mouth three (3) times daily. (Patient taking differently: Take 20 mg by mouth two (2) times a day.) 90 Tab 2   ??? prochlorperazine (COMPAZINE) 10 mg tablet Take 0.5 Tabs by mouth every six (6) hours as needed. 30 Tab 5   ??? gemfibrozil (LOPID) 600 mg tablet TAKE 1 TABLET BY MOUTH TWICE DAILY 180 Tab 3   ??? ketoconazole (NIZORAL) 2 % topical cream Apply  to affected area daily. 15 g 0   ??? metaxalone (SKELAXIN) 800 mg tablet Take 800 mg by mouth three (3) times daily as needed.     ??? augmented betamethasone dipropionate (DIPROLENE-AF) 0.05 % ointment Apply  to affected area two (2) times a day.     ??? naloxone (NARCAN) 4 mg/actuation nasal spray Use 1 spray intranasally into 1 nostril. Use a new Narcan nasal spray for subsequent doses and administer into alternating nostrils. May repeat every 2 to 3 minutes as needed for opioid overdose symptoms. 1 Each 0   ??? oxyCODONE IR (ROXICODONE) 20 mg immediate release tablet TK 1 T PO Q 6 HOURS  0   ??? aspirin 81 mg chewable tablet Take 81 mg by mouth daily.     ??? cholecalciferol, vitamin D3, (VITAMIN D3) 2,000 unit tab Take  by mouth.     ??? B.infantis-B.ani-B.long-B.bifi (PROBIOTIC 4X) 10-15 mg TbEC Take  by mouth.     ??? lactulose (CHRONULAC) 10 gram/15 mL solution Take  by mouth three (3) times daily.     ??? cyclobenzaprine (FLEXERIL) 10 mg tablet Take  by mouth three (3) times daily as needed for Muscle Spasm(s).         Review of Systems  Constitutional: The patient has no acute distress or discomfort.   HEENT: The patient denies recent head trauma, eye pain, blurred vision,  hearing deficit, oropharyngeal mucosal pain or lesions, and the patient denies throat pain or discomfort.  Lymphatics: The patient denies palpable peripheral lymphadenopathy.  Hematologic: The patient denies having bruising, bleeding, or progressive fatigue.  Respiratory: Patient denies having shortness of breath, cough, sputum production, fever, or dyspnea on exertion.  Cardiovascular: The patient denies having leg pain, leg swelling, heart palpitations, chest permit, chest pain, or lightheadedness.  The patient denies having dyspnea on exertion.  Gastrointestinal: The patient denies having nausea, emesis, or diarrhea. The patient denies having any hematemesis or blood in the stool.  Genitourinary: Patient denies having urinary urgency, frequency, or dysuria.  The patient denies having blood in the urine.  Psychological: The patient denies having symptoms of nervousness, anxiety, depression, or thoughts of harming self.  Skin: Patient denies having skin rashes, skin, ulcerations, or unexplained itching or pruritus.  Musculoskeletal: The patient denies having pain in the joints or bones.      Objective:     Visit Vitals  BP 111/62   Pulse 61   Temp 98 ??F (36.7 ??C) (Oral)   Resp 18   Ht 5\' 9"  (1.753 m)   Wt 60.6 kg (133 lb 8 oz)   SpO2 97%   BMI 19.71 kg/m??     ECOG PS=0  Physical Exam:   Gen. Appearance: The patient is in no acute distress.  Skin: There is no bruise or rash.  HEENT: The exam is unremarkable.  Neck: Supple without lymphadenopathy or thyromegaly.  Lungs: Clear to auscultation and percussion; there are no wheezes or rhonchi.  Heart: Regular rate and rhythm; there are no murmurs, gallops, or rubs.  Abdomen: Bowel sounds are present and normal.  There is no guarding, tenderness, or hepatosplenomegaly.  Extremities: There is no clubbing, cyanosis, or edema.  Neurologic: There are no focal neurologic deficits.  Lymphatics:  There is no palpable peripheral lymphadenopathy. Musculoskeletal: The patient has full range of motion at all joints.  There is no evidence of joint deformity or effusions.  There is no focal joint tenderness.  Psychological/psychiatric: There is no clinical evidence of anxiety, depression, or melancholy.    Lab data:      Results for orders placed or performed during the [facility] encounter of 07/06/17   CBC WITH 3 PART DIFF     Status: Abnormal   Result Value Ref Range Status    WBC 2.8 (L) 4.5 - 13.0 K/uL Final    RBC 3.73 (L) 4.10 - 5.10 M/uL Final    HGB 10.3 (L) 12.0 - 16.0 g/dL Final    HCT 33.7 (L) 36 - 48 % Final    MCV 90.3 78 - 102 FL Final    MCH 27.6 25.0 - 35.0 PG Final    MCHC 30.6 (L) 31 - 37 g/dL Final    RDW 20.0 (H) 11.5 - 14.5 % Final    PLATELET 82 (L) 140 - 440 K/uL Final    NEUTROPHILS 78 (H) 40 - 70 % Final    MIXED CELLS 10 0.1 - 17 % Final    LYMPHOCYTES 12 (L) 14 - 44 % Final    ABS. NEUTROPHILS 2.2 1.8 - 9.5 K/UL Final    ABS. MIXED CELLS 0.3 0.0 - 2.3 K/uL Final    ABS. LYMPHOCYTES 0.3 (L) 1.1 - 5.9 K/UL Final     Comment: Test performed at [facility]. Results Reviewed by Medical Director.    DF AUTOMATED   Final           Assessment:     1. Thrombocytopenia ([place])    2. Myelodysplastic syndrome ([place])    3. Iron deficiency anemia secondary to inadequate dietary iron intake      Plan:   Myelodysplastic syndrome, refractory anemia due to myelodysplastic syndrome/iron deficiency anemia: Have explained to the patient that his hemoglobin today is 10.3 g/dL with hematocrit of 34 %.  He did receive Procrit at a dose of 60,000 units yesterday.  I will check his iron profile and ferritin levels at this time.  If his ferritin level is below 100 and we will need to give him intravenous iron to replenish his ferritin level.    Chronic leukopenia: The CBC from today shows that his WBC count is 3.3 with an absolute neutrophil count which is normal at 2.6.  However the  absolute lymphocyte count is low at 0.5.  These will continue to be monitored.  Therapeutic intervention is not warranted.    Thrombocytopenia: The patient has a stable lytic count of 85,000.  No therapeutic intervention is warranted.    Follow-up in 2 months  Orders Placed This Encounter   ??? METABOLIC PANEL, COMPREHENSIVE     Standing Status:   Future     Standing Expiration Date:   07/07/2018   ??? IRON PROFILE     Standing Status:   Future     Standing Expiration Date:   07/07/2018   ??? FERRITIN  Standing Status:   Future     Standing Expiration Date:   07/07/2018       Suzy Bouchard, MD  07/06/2017      Please note: This document has been produced using voice recognition software.  Unrecognized errors in transcription may be present.

## 2017-07-07 LAB — METABOLIC PANEL, COMPREHENSIVE
A-G Ratio: 1.4 (ref 0.8–1.7)
ALT (SGPT): 25 U/L (ref 16–61)
AST (SGOT): 25 U/L (ref 15–37)
Albumin: 4.6 g/dL (ref 3.4–5.0)
Alk. phosphatase: 165 U/L — ABNORMAL HIGH (ref 45–117)
Anion gap: 8 mmol/L (ref 3.0–18)
BUN/Creatinine ratio: 18 (ref 12–20)
BUN: 19 MG/DL — ABNORMAL HIGH (ref 7.0–18)
Bilirubin, total: 0.5 MG/DL (ref 0.2–1.0)
CO2: 22 mmol/L (ref 21–32)
Calcium: 9.6 MG/DL (ref 8.5–10.1)
Chloride: 108 mmol/L (ref 100–108)
Creatinine: 1.06 MG/DL (ref 0.6–1.3)
GFR est AA: >60 mL/min/{1.73_m2} (ref >60)
GFR est non-AA: >60 mL/min/{1.73_m2} (ref >60)
Globulin: 3.2 g/dL (ref 2.0–4.0)
Glucose: 115 mg/dL — ABNORMAL HIGH (ref 74–99)
Potassium: 4.9 mmol/L (ref 3.5–5.5)
Protein, total: 7.8 g/dL (ref 6.4–8.2)
Sodium: 138 mmol/L (ref 136–145)

## 2017-07-07 LAB — FERRITIN: Ferritin: 60 NG/ML (ref 8–388)

## 2017-07-07 LAB — IRON PROFILE
Iron % saturation: 11 %
Iron: 48 ug/dL — ABNORMAL LOW (ref 50–175)
TIBC: 445 ug/dL (ref 250–450)

## 2017-07-07 NOTE — Progress Notes (Signed)
Results reviewed and noted. Will continue to monitor.

## 2017-07-08 NOTE — Telephone Encounter (Signed)
Pts wife calling b/c she stated when he was in for his appt Dr Phineas Douglas said she want his medical records from Dr. Manus Rudd. They filled out the record release form and the front desk faxed it but Dr Phineas Douglas informed him that she still hasnt rec'vd the records yet. The pt states Dr Phineas Douglas wanted Dr. Roseanne Kaufman # to call p. [phone]

## 2017-07-08 NOTE — Progress Notes (Signed)
Results reviewed and noted. Will continue to monitor.

## 2017-07-12 NOTE — Telephone Encounter (Signed)
The requested documents are scanned into patient's chart as of 11/25/2016.

## 2017-07-13 ENCOUNTER — Encounter

## 2017-07-14 ENCOUNTER — Encounter

## 2017-07-15 MED ORDER — GENERLAC 10 GRAM/15 ML ORAL SOLUTION
10 gram/15 mL | ORAL | 0 refills | Status: DC
Start: 2017-07-15 — End: 2017-09-07

## 2017-07-15 MED ORDER — SUMATRIPTAN 100 MG TAB
100 mg | ORAL_TABLET | ORAL | 0 refills | Status: DC
Start: 2017-07-15 — End: 2017-08-09

## 2017-07-15 MED ORDER — ROSUVASTATIN 10 MG TAB
10 mg | ORAL_TABLET | ORAL | 0 refills | Status: DC
Start: 2017-07-15 — End: 2017-10-26

## 2017-07-15 MED ORDER — FINASTERIDE 5 MG TAB
5 mg | ORAL_TABLET | ORAL | 0 refills | Status: DC
Start: 2017-07-15 — End: 2017-08-15

## 2017-07-15 MED ORDER — PROPRANOLOL 20 MG TAB
20 mg | ORAL_TABLET | ORAL | 0 refills | Status: DC
Start: 2017-07-15 — End: 2017-08-29

## 2017-07-20 ENCOUNTER — Inpatient Hospital Stay: Admit: 2017-07-20 | Payer: MEDICARE | Primary: Legal Medicine

## 2017-07-20 ENCOUNTER — Telehealth

## 2017-07-20 ENCOUNTER — Institutional Professional Consult (permissible substitution): Admit: 2017-07-20 | Discharge: 2017-07-20 | Payer: PRIVATE HEALTH INSURANCE | Primary: Legal Medicine

## 2017-07-20 ENCOUNTER — Inpatient Hospital Stay: Admit: 2017-07-20 | Primary: Legal Medicine

## 2017-07-20 ENCOUNTER — Encounter: Primary: Legal Medicine

## 2017-07-20 DIAGNOSIS — D649 Anemia, unspecified: Secondary | ICD-10-CM

## 2017-07-20 LAB — CBC WITH 3 PART DIFF
ABS. LYMPHOCYTES: 0.5 10*3/uL — ABNORMAL LOW (ref 1.1–5.9)
ABS. MIXED CELLS: 0.3 10*3/uL (ref 0.0–2.3)
ABS. NEUTROPHILS: 2.3 10*3/uL (ref 1.8–9.5)
HCT: 29.2 % — ABNORMAL LOW (ref 36–48)
HGB: 9 g/dL — ABNORMAL LOW (ref 12.0–16.0)
LYMPHOCYTES: 16 % (ref 14–44)
MCH: 28 PG (ref 25.0–35.0)
MCHC: 30.8 g/dL — ABNORMAL LOW (ref 31–37)
MCV: 91 FL (ref 78–102)
Mixed cells: 11 % (ref 0.1–17)
NEUTROPHILS: 74 % — ABNORMAL HIGH (ref 40–70)
PLATELET: 86 10*3/uL — ABNORMAL LOW (ref 140–440)
RBC: 3.21 M/uL — ABNORMAL LOW (ref 4.10–5.10)
RDW: 17.4 % — ABNORMAL HIGH (ref 11.5–14.5)
WBC: 3.1 10*3/uL — ABNORMAL LOW (ref 4.5–13.0)

## 2017-07-20 MED ORDER — EPOETIN ALFA 40,000 UNIT/ML IJ SOLN
40000 unit/mL | Freq: Once | INTRAMUSCULAR | Status: AC
Start: 2017-07-20 — End: 2017-07-20
  Administered 2017-07-20: 20:00:00 via SUBCUTANEOUS

## 2017-07-20 MED ORDER — EPOETIN ALFA 20,000 UNIT/ML IJ SOLN
20000 unit/mL | Freq: Once | INTRAMUSCULAR | Status: AC
Start: 2017-07-20 — End: 2017-07-20
  Administered 2017-07-20: 20:00:00 via SUBCUTANEOUS

## 2017-07-20 MED FILL — PROCRIT 40,000 UNIT/ML INJECTION SOLUTION: 40000 unit/mL | INTRAMUSCULAR | Qty: 1

## 2017-07-20 MED FILL — PROCRIT 20,000 UNIT/ML INJECTION SOLUTION: 20000 unit/mL | INTRAMUSCULAR | Qty: 1

## 2017-07-20 NOTE — Telephone Encounter (Signed)
The patient's spouse is asking if you would check his latest iron levels to see if he is in need of another iron infusion. She states the patient is tired all the time and she thinks he might be needing infused.  Her phone number is [phone]

## 2017-07-20 NOTE — Progress Notes (Signed)
[facility] Progress Note    Date: July 20, 2017    Name: William Blanch[facility] Sr.    MRN: [id number]         DOB: 1946/05/17      Mr. Westgate was assessed and education was provided.     Mr. Ariola vitals were reviewed and patient was observed for 5 minutes prior to treatment.   Visit Vitals  BP 102/55 (BP 1 Location: Right arm, BP Patient Position: At rest;Sitting)   Pulse 79   Temp 97.9 ??F (36.6 ??C)   Resp 16       Lab results were obtained and reviewed.  Recent Results (from the past 12 hour(s))   CBC WITH 3 PART DIFF    Collection Time: 07/20/17  2:32 PM   Result Value Ref Range    WBC 3.1 (L) 4.5 - 13.0 K/uL    RBC 3.21 (L) 4.10 - 5.10 M/uL    HGB 9.0 (L) 12.0 - 16.0 g/dL    HCT 29.2 (L) 36 - 48 %    MCV 91.0 78 - 102 FL    MCH 28.0 25.0 - 35.0 PG    MCHC 30.8 (L) 31 - 37 g/dL    RDW 17.4 (H) 11.5 - 14.5 %    PLATELET 86 (L) 140 - 440 K/uL    NEUTROPHILS 74 (H) 40 - 70 %    MIXED CELLS 11 0.1 - 17 %    LYMPHOCYTES 16 14 - 44 %    ABS. NEUTROPHILS 2.3 1.8 - 9.5 K/UL    ABS. MIXED CELLS 0.3 0.0 - 2.3 K/uL    ABS. LYMPHOCYTES 0.5 (L) 1.1 - 5.9 K/UL    DF AUTOMATED         Procrit 60,000 units SQ given for H&H 9.0/29.2   Patient armband removed and shredded.    Mr. Shackleton was discharged from [facility] in stable condition at 1510. He is to return on 08/06/17 at 1400 for his next appointment for CBC?Procrit ! 2 wks.    Lynnda Shields, RN  July 20, 2017  3:09 PM

## 2017-07-21 ENCOUNTER — Encounter

## 2017-07-21 MED ORDER — LORAZEPAM 1 MG TAB
1 mg | ORAL_TABLET | Freq: Three times a day (TID) | ORAL | 0 refills | Status: DC | PRN
Start: 2017-07-21 — End: 2017-08-26

## 2017-07-21 MED ORDER — LORAZEPAM 1 MG TAB
1 mg | ORAL_TABLET | Freq: Three times a day (TID) | ORAL | 0 refills | Status: DC | PRN
Start: 2017-07-21 — End: 2017-07-21

## 2017-07-21 NOTE — Telephone Encounter (Signed)
Called patient's wife today. She states the patient received his procrit yesterday 07/20/17.

## 2017-07-21 NOTE — Telephone Encounter (Signed)
Patient in office with wife requesting medication refill. Patient understands he doesn't not have an appointment.    Requested Prescriptions     Pending Prescriptions Disp Refills   ??? LORazepam (ATIVAN) 1 mg tablet 90 Tab 0     Sig: Take 1 Tab by mouth every eight (8) hours as needed for Anxiety for up to 90 days. Max Daily Amount: 3 mg.

## 2017-07-26 ENCOUNTER — Encounter: Attending: Legal Medicine | Primary: Legal Medicine

## 2017-07-31 NOTE — Telephone Encounter (Signed)
Done

## 2017-07-31 NOTE — Telephone Encounter (Signed)
Noted  

## 2017-08-02 MED ORDER — SUCRALFATE 1 GRAM TAB
1 gram | ORAL_TABLET | ORAL | 0 refills | Status: DC
Start: 2017-08-02 — End: 2017-08-29

## 2017-08-03 ENCOUNTER — Institutional Professional Consult (permissible substitution): Admit: 2017-08-03 | Discharge: 2017-08-03 | Payer: PRIVATE HEALTH INSURANCE | Primary: Legal Medicine

## 2017-08-03 ENCOUNTER — Inpatient Hospital Stay: Admit: 2017-08-03 | Payer: MEDICARE | Primary: Legal Medicine

## 2017-08-03 ENCOUNTER — Inpatient Hospital Stay: Admit: 2017-08-03 | Primary: Legal Medicine

## 2017-08-03 DIAGNOSIS — D649 Anemia, unspecified: Secondary | ICD-10-CM

## 2017-08-03 DIAGNOSIS — D508 Other iron deficiency anemias: Secondary | ICD-10-CM

## 2017-08-03 LAB — CBC WITH 3 PART DIFF
ABS. LYMPHOCYTES: 0.4 10*3/uL — ABNORMAL LOW (ref 1.1–5.9)
ABS. MIXED CELLS: 0.4 10*3/uL (ref 0.0–2.3)
ABS. NEUTROPHILS: 2.1 10*3/uL (ref 1.8–9.5)
HCT: 29.1 % — ABNORMAL LOW (ref 36–48)
HGB: 8.7 g/dL — ABNORMAL LOW (ref 12.0–16.0)
LYMPHOCYTES: 13 % — ABNORMAL LOW (ref 14–44)
MCH: 26.3 PG (ref 25.0–35.0)
MCHC: 29.9 g/dL — ABNORMAL LOW (ref 31–37)
MCV: 87.9 FL (ref 78–102)
Mixed cells: 13 % (ref 0.1–17)
NEUTROPHILS: 74 % — ABNORMAL HIGH (ref 40–70)
PLATELET: 82 10*3/uL — ABNORMAL LOW (ref 140–440)
RBC: 3.31 M/uL — ABNORMAL LOW (ref 4.10–5.10)
RDW: 15.7 % — ABNORMAL HIGH (ref 11.5–14.5)
WBC: 2.9 10*3/uL — ABNORMAL LOW (ref 4.5–13.0)

## 2017-08-03 MED ORDER — EPOETIN ALFA 40,000 UNIT/ML IJ SOLN
40000 unit/mL | Freq: Once | INTRAMUSCULAR | Status: AC
Start: 2017-08-03 — End: 2017-08-03
  Administered 2017-08-03: 20:00:00 via SUBCUTANEOUS

## 2017-08-03 MED ORDER — EPOETIN ALFA 20,000 UNIT/ML IJ SOLN
20000 unit/mL | Freq: Once | INTRAMUSCULAR | Status: AC
Start: 2017-08-03 — End: 2017-08-03
  Administered 2017-08-03: 20:00:00 via SUBCUTANEOUS

## 2017-08-03 MED FILL — PROCRIT 40,000 UNIT/ML INJECTION SOLUTION: 40000 unit/mL | INTRAMUSCULAR | Qty: 1

## 2017-08-03 MED FILL — PROCRIT 20,000 UNIT/ML INJECTION SOLUTION: 20000 unit/mL | INTRAMUSCULAR | Qty: 1

## 2017-08-03 NOTE — Progress Notes (Signed)
[facility]IC Progress Note    Date: August 03, 2017    Name: Jene Oravec [facility] Sr.    MRN: [id number]         DOB: Jul 18, 1945      Mr. Bellot was assessed and education was provided.     Mr. Sangalang vitals were reviewed and patient was observed for 5 minutes prior to treatment.   Visit Vitals  BP 103/62 (BP 1 Location: Left arm, BP Patient Position: At rest;Sitting)   Pulse 66   Temp 97.9 ??F (36.6 ??C)   Resp 16       Lab results were obtained and reviewed.  Recent Results (from the past 12 hour(s))   CBC WITH 3 PART DIFF    Collection Time: 08/03/17  2:31 PM   Result Value Ref Range    WBC 2.9 (L) 4.5 - 13.0 K/uL    RBC 3.31 (L) 4.10 - 5.10 M/uL    HGB 8.7 (L) 12.0 - 16.0 g/dL    HCT 29.1 (L) 36 - 48 %    MCV 87.9 78 - 102 FL    MCH 26.3 25.0 - 35.0 PG    MCHC 29.9 (L) 31 - 37 g/dL    RDW 15.7 (H) 11.5 - 14.5 %    PLATELET 82 (L) 140 - 440 K/uL    NEUTROPHILS 74 (H) 40 - 70 %    MIXED CELLS 13 0.1 - 17 %    LYMPHOCYTES 13 (L) 14 - 44 %    ABS. NEUTROPHILS 2.1 1.8 - 9.5 K/UL    ABS. MIXED CELLS 0.4 0.0 - 2.3 K/uL    ABS. LYMPHOCYTES 0.4 (L) 1.1 - 5.9 K/UL    DF AUTOMATED       Procrit 60,000 units SQ given for H&H 8.7/29.1.    Mr. Schoenberg tolerated the injection, and had no complaints.  Patient armband removed and shredded.    Mr. Mangiaracina was discharged from [facility] in stable condition at 1450. He is to return on 08/17/17 at 1400 for his next appointment for CBC/Procrit Q 2 wks.    Lynnda Shields, RN  August 03, 2017  3:37 PM

## 2017-08-09 ENCOUNTER — Encounter

## 2017-08-09 MED ORDER — SUMATRIPTAN 100 MG TAB
100 mg | ORAL_TABLET | ORAL | 0 refills | Status: DC
Start: 2017-08-09 — End: 2017-09-27

## 2017-08-10 ENCOUNTER — Ambulatory Visit
Admit: 2017-08-10 | Discharge: 2017-08-10 | Payer: PRIVATE HEALTH INSURANCE | Attending: Legal Medicine | Primary: Legal Medicine

## 2017-08-10 DIAGNOSIS — J0100 Acute maxillary sinusitis, unspecified: Secondary | ICD-10-CM

## 2017-08-10 MED ORDER — CLINDAMYCIN 300 MG CAP
300 mg | ORAL_CAPSULE | Freq: Three times a day (TID) | ORAL | 0 refills | Status: AC
Start: 2017-08-10 — End: 2017-08-20

## 2017-08-10 NOTE — Progress Notes (Signed)
William Blalock Sadowsky Sr. is a 72 y.o. male presents in office migraine, sinus pressure, sneezing x 3 days     Health Maintenance Due   Topic Date Due   ??? Hepatitis C Screening  [birth year]   ??? FOOT EXAM Q1  03/19/1958   ??? MICROALBUMIN Q1  03/19/1958   ??? EYE EXAM RETINAL OR DILATED  03/19/1958   ??? DTaP/Tdap/Td series (1 - Tdap) 03/19/1969   ??? Shingrix Vaccine Age 39> (1 of 2) 03/19/1998   ??? GLAUCOMA SCREENING Q2Y  03/19/2013   ??? Pneumococcal 65+ Low/Medium Risk (1 of 2 - PCV13) 03/19/2013   ??? HEMOGLOBIN A1C Q6M  12/28/2016   ??? LIPID PANEL Q1  06/30/2017       1. Have you been to the ER, urgent care clinic since your last visit?  Hospitalized since your last visit?No    2. Have you seen or consulted any other health care providers outside of the [place] since your last visit?  Include any pap smears or colon screening. No

## 2017-08-10 NOTE — Progress Notes (Signed)
William Blalock Dambrosia Sr.     Chief Complaint   Patient presents with   ??? Sinus Pain     x 1 to 2 weeks    ??? Migraine   ??? Sneezing     Vitals:    08/10/17 1623   BP: 107/64   Pulse: 68   Temp: 98.4 ??F (36.9 ??C)   TempSrc: Oral   SpO2: 100%   Weight: 141 lb (64 kg)   Height: '5\' 9"'$  (1.753 m)   PainSc:  10 - Worst pain ever   PainLoc: Head         HPI: Is here today because of sinus congestion nasal congestion for the last 2 weeks is getting worse and he is having severe tenderness on the left maxillary area, greenish nasal disc discharge, migraine headache has been aggravated by the sinus infection.      Has had to reduce on his pain medication oxycodone from 10-5 mg by pain management, he retained due to removing his legs and insomnia and lack of sleep, discussed this in the next visit with his pain management doctor.  Past Medical History:   Diagnosis Date   ??? Anemia    ??? Anxiety    ??? Chronic pain    ??? Cirrhosis of liver ([place])    ??? Diabetes ([place])    ??? GERD (gastroesophageal reflux disease)    ??? Hyperlipemia    ??? Hypertension    ??? Hypotension    ??? Migraine    ??? Other cirrhosis of liver ([place])    ??? Stroke [facility])     had 3 strokes    ??? Thrombocytopenia ([place])      Past Surgical History:   Procedure Laterality Date   ??? HX CHOLECYSTECTOMY     ??? HX ORTHOPAEDIC      R hand sx   ??? HX OTHER SURGICAL      Hand surgery- Nail gun went through finger     Social History     Tobacco Use   ??? Smoking status: Former Smoker   ??? Smokeless tobacco: Never Used   ??? Tobacco comment: quit years ago   Substance Use Topics   ??? Alcohol use: No       Family History   Problem Relation Age of Onset   ??? Alzheimer Mother    ??? Diabetes Mother    ??? Cancer Mother         colon cancer    ??? Hypertension Father    ??? Heart Disease Father    ??? Cancer Father         prostate, lung cancer    ??? Diabetes Sister    ??? Heart Disease Sister    ??? Diabetes Brother        Review of Systems   Constitutional: Negative for chills, fever, malaise/fatigue and weight loss.    HENT: Positive for congestion and sinus pain. Negative for ear discharge, ear pain, hearing loss, nosebleeds and sore throat.    Eyes: Negative for blurred vision, double vision and discharge.   Respiratory: Negative for cough, hemoptysis, sputum production, shortness of breath and wheezing.    Cardiovascular: Negative for chest pain, palpitations, claudication and leg swelling.   Gastrointestinal: Positive for nausea. Negative for abdominal pain, constipation, diarrhea, heartburn and vomiting.   Genitourinary: Negative for dysuria, frequency and urgency.   Musculoskeletal: Negative for myalgias.   Skin: Negative for itching and rash.   Neurological: Positive for headaches. Negative for  tingling, sensory change, speech change, focal weakness and weakness.   Psychiatric/Behavioral: Negative for depression and suicidal ideas. The patient has insomnia.        Physical Exam   Constitutional: He is oriented to person, place, and time. He appears well-developed and well-nourished. No distress.   HENT:   Head: Normocephalic and atraumatic.   Mouth/Throat: No oropharyngeal exudate.   Left-sided maxillary sinus tenderness   Eyes: Conjunctivae are normal. Pupils are equal, round, and reactive to light. Right eye exhibits no discharge. Left eye exhibits no discharge. No scleral icterus.   Neck: Normal range of motion. Neck supple. No thyromegaly present.   Left submandibular tender lymph node   Cardiovascular: Normal rate, regular rhythm and normal heart sounds.   Pulmonary/Chest: Effort normal and breath sounds normal. No respiratory distress. He has no rales.   Abdominal: Soft. Bowel sounds are normal. He exhibits no distension and no mass. There is no tenderness. There is no rebound.   Musculoskeletal: Normal range of motion. He exhibits no edema, tenderness or deformity.   Lymphadenopathy:     He has no cervical adenopathy.   Neurological: He is alert and oriented to person, place, and time. No  cranial nerve deficit. Coordination normal.   Skin: Skin is warm and dry. No rash noted. He is not diaphoretic. No erythema.   Psychiatric: He has a normal mood and affect. Judgment and thought content normal.   Nursing note and vitals reviewed.       Assessment and plan     Plan of care has been discussed with the patient, he agrees to the plan and verbalized understanding.  All his questions were answered  More than 50% of the time spent in this visit was counseling the patient about  illness and treatment options         1. Acute non-recurrent maxillary sinusitis    - clindamycin (CLEOCIN) 300 mg capsule; Take 1 Cap by mouth three (3) times daily for 10 days.  Dispense: 30 Cap; Refill: 0    2. Migraine without aura and without status migrainosus, not intractable  She did have a migraine episode now is been aggravated but his acute sinus infection    3. Insomnia, unspecified type  With the patient and his spouse that he is already taking Ativan about 3 mg daily, as well as he is on Roxicodone for pain and sumatriptan for headaches, plus his other medication, it would not be advisable for him to go on any other medication to sleep I recommended that he try melatonin over-the-counter, or Benadryl over-the-counter.  Still advise him about episode of dizziness with these medications and risk for fall patient and wife verbalized understanding    Current Outpatient Medications   Medication Sig Dispense Refill   ??? clindamycin (CLEOCIN) 300 mg capsule Take 1 Cap by mouth three (3) times daily for 10 days. 30 Cap 0   ??? SUMAtriptan (IMITREX) 100 mg tablet TAKE 1 TABLET BY MOUTH ONCE AS NEEDED FOR MIGRAINE FOR UP TO 1 DOSE 10 Tab 0   ??? oxyCODONE IR (ROXICODONE) 10 mg tab immediate release tablet Take 10 mg by mouth every six (6) hours as needed for Pain.     ??? sucralfate (CARAFATE) 1 gram tablet TAKE 1 TABLET BY MOUTH FOUR TIMES DAILY WITH MEALS AND AT BEDTIME 120 Tab 0    ??? LORazepam (ATIVAN) 1 mg tablet Take 1 Tab by mouth every eight (8) hours as needed for Anxiety for up to  90 days. Max Daily Amount: 3 mg. 90 Tab 0   ??? GENERLAC 10 gram/15 mL solution TAKE 30CC BY MOUTH TWICE DAILY 1892 mL 0   ??? finasteride (PROSCAR) 5 mg tablet TAKE 1 TABLET BY MOUTH DAILY 30 Tab 0   ??? propranolol (INDERAL) 20 mg tablet TAKE 1 TABLET BY MOUTH THREE TIMES DAILY 90 Tab 0   ??? topiramate (TOPAMAX) 100 mg tablet TAKE 1 TABLET BY MOUTH DAILY 90 Tab 0   ??? HUMULIN R REGULAR U-100 INSULN 100 unit/mL injection As needed per sliding scale 1 Vial 6   ??? Insulin Syringe-Needle U-100 1 mL 29 gauge x 1/2" syrg Three times per day and as needed as per sliding scale 100 Syringe 6   ??? prochlorperazine (COMPAZINE) 10 mg tablet Take 0.5 Tabs by mouth every six (6) hours as needed. 30 Tab 5   ??? gemfibrozil (LOPID) 600 mg tablet TAKE 1 TABLET BY MOUTH TWICE DAILY 180 Tab 3   ??? ketoconazole (NIZORAL) 2 % topical cream Apply  to affected area daily. 15 g 0   ??? naloxone (NARCAN) 4 mg/actuation nasal spray Use 1 spray intranasally into 1 nostril. Use a new Narcan nasal spray for subsequent doses and administer into alternating nostrils. May repeat every 2 to 3 minutes as needed for opioid overdose symptoms. 1 Each 0   ??? cholecalciferol, vitamin D3, (VITAMIN D3) 2,000 unit tab Take  by mouth.     ??? B.infantis-B.ani-B.long-B.bifi (PROBIOTIC 4X) 10-15 mg TbEC Take  by mouth.     ??? cyclobenzaprine (FLEXERIL) 10 mg tablet Take  by mouth three (3) times daily as needed for Muscle Spasm(s).     ??? metFORMIN (GLUCOPHAGE) 500 mg tablet TAKE 2 TABLETS BY MOUTH TWICE DAILY WITH MEALS(GENERIC FOR GLUCOPHAGE) 360 Tab 0   ??? rosuvastatin (CRESTOR) 10 mg tablet TAKE 1 TABLET BY MOUTH NIGHTLY 90 Tab 0   ??? citalopram (CELEXA) 40 mg tablet TAKE 1 TABLET BY MOUTH DAILY 90 Tab 0   ??? butalbital-acetaminophen-caffeine (FIORICET, ESGIC) 50-325-40 mg per tablet Take 1 Tab by mouth every six (6) hours as needed for Pain. 20 Tab 1    ??? metaxalone (SKELAXIN) 800 mg tablet Take 800 mg by mouth three (3) times daily as needed.     ??? augmented betamethasone dipropionate (DIPROLENE-AF) 0.05 % ointment Apply  to affected area two (2) times a day.     ??? aspirin 81 mg chewable tablet Take 81 mg by mouth daily.         Patient Active Problem List    Diagnosis Date Noted   ??? Migraine without aura and without status migrainosus, not intractable 04/15/2017   ??? 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 Dermatitis 01/05/2017   ??? Benign prostatic hyperplasia (BPH) with straining on urination 12/31/2016   ??? 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 Myelodysplastic syndrome ([place]) 12/22/2016   ??? Type 2 diabetes with nephropathy ([place]) 10/18/2016   ??? Iron deficiency anemia secondary to inadequate dietary iron intake 09/23/2016   ??? Chronic leukopenia 09/23/2016   ??? Thrombocytopenia ([place]) 09/23/2016   ??? 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 Type II diabetes mellitus ([place]) 08/03/2016   ??? HTN (hypertension) 08/03/2016   ??? 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 Stroke ([place]) 08/03/2016   ??? Compressed vertebrae ([place]) 08/03/2016   ??? Chronic back pain greater than 3 months duration 08/03/2016   ??? Hyperlipemia 08/03/2016   ??? Anxiety 08/03/2016   ??? Depression  06/30/2016   ??? Iron deficiency anemia 04/22/2016   ??? NAFLD (nonalcoholic fatty liver disease) 04/22/2016   ??? Other cirrhosis of liver ([place]) 04/22/2016   ??? Chronic anemia 03/18/2016   ??? Controlled type 2 diabetes mellitus without complication, without long-term current use of insulin ([place]) 03/18/2016   ??? Essential hypertension 03/18/2016   ??? Hyperlipidemia 03/18/2016   ??? History of stroke 03/18/2016   ??? Anxiety 03/18/2016   ??? Chronic pain 03/18/2016     Results for orders placed or performed during the hospital encounter of 08/03/17   CBC WITH 3 PART DIFF   Result Value Ref Range    WBC 2.9 (L) 4.5 - 13.0 K/uL    RBC 3.31 (L) 4.10 - 5.10 M/uL    HGB 8.7 (L) 12.0 - 16.0 g/dL     HCT 29.1 (L) 36 - 48 %    MCV 87.9 78 - 102 FL    MCH 26.3 25.0 - 35.0 PG    MCHC 29.9 (L) 31 - 37 g/dL    RDW 15.7 (H) 11.5 - 14.5 %    PLATELET 82 (L) 140 - 440 K/uL    NEUTROPHILS 74 (H) 40 - 70 %    MIXED CELLS 13 0.1 - 17 %    LYMPHOCYTES 13 (L) 14 - 44 %    ABS. NEUTROPHILS 2.1 1.8 - 9.5 K/UL    ABS. MIXED CELLS 0.4 0.0 - 2.3 K/uL    ABS. LYMPHOCYTES 0.4 (L) 1.1 - 5.9 K/UL    [facility] AUTOMATED       Hospital Outpatient Visit on 08/03/2017   Component Date Value Ref Range Status   ??? WBC 08/03/2017 2.9* 4.5 - 13.0 K/uL Final   ??? RBC 08/03/2017 3.31* 4.10 - 5.10 M/uL Final   ??? HGB 08/03/2017 8.7* 12.0 - 16.0 g/dL Final   ??? HCT 08/03/2017 29.1* 36 - 48 % Final   ??? MCV 08/03/2017 87.9  78 - 102 FL Final   ??? MCH 08/03/2017 26.3  25.0 - 35.0 PG Final   ??? MCHC 08/03/2017 29.9* 31 - 37 g/dL Final   ??? RDW 08/03/2017 15.7* 11.5 - 14.5 % Final   ??? PLATELET 08/03/2017 82* 140 - 440 K/uL Final   ??? NEUTROPHILS 08/03/2017 74* 40 - 70 % Final   ??? MIXED CELLS 08/03/2017 13  0.1 - 17 % Final   ??? LYMPHOCYTES 08/03/2017 13* 14 - 44 % Final   ??? ABS. NEUTROPHILS 08/03/2017 2.1  1.8 - 9.5 K/UL Final   ??? ABS. MIXED CELLS 08/03/2017 0.4  0.0 - 2.3 K/uL Final   ??? ABS. LYMPHOCYTES 08/03/2017 0.4* 1.1 - 5.9 K/UL Final    Test performed at [place] Location. Results Reviewed by Medical Director.   ??? DF 08/03/2017 AUTOMATED    Final   Hospital Outpatient Visit on 07/20/2017   Component Date Value Ref Range Status   ??? WBC 07/20/2017 3.1* 4.5 - 13.0 K/uL Final   ??? RBC 07/20/2017 3.21* 4.10 - 5.10 M/uL Final   ??? HGB 07/20/2017 9.0* 12.0 - 16.0 g/dL Final   ??? HCT 07/20/2017 29.2* 36 - 48 % Final   ??? MCV 07/20/2017 91.0  78 - 102 FL Final   ??? MCH 07/20/2017 28.0  25.0 - 35.0 PG Final   ??? MCHC 07/20/2017 30.8* 31 - 37 g/dL Final   ??? RDW 07/20/2017 17.4* 11.5 - 14.5 % Final   ??? PLATELET 07/20/2017 86* 140 - 440 K/uL Final   ???  NEUTROPHILS 07/20/2017 74* 40 - 70 % Final   ??? MIXED CELLS 07/20/2017 11  0.1 - 17 % Final    ??? LYMPHOCYTES 07/20/2017 16  14 - 44 % Final   ??? ABS. NEUTROPHILS 07/20/2017 2.3  1.8 - 9.5 K/UL Final   ??? ABS. MIXED CELLS 07/20/2017 0.3  0.0 - 2.3 K/uL Final   ??? ABS. LYMPHOCYTES 07/20/2017 0.5* 1.1 - 5.9 K/UL Final    Test performed at [place] Location. Results Reviewed by Medical Director.   ??? DF 07/20/2017 AUTOMATED    Final   Hospital Outpatient Visit on 07/06/2017   Component Date Value Ref Range Status   ??? Sodium 07/06/2017 138  136 - 145 mmol/L Final   ??? Potassium 07/06/2017 4.9  3.5 - 5.5 mmol/L Final   ??? Chloride 07/06/2017 108  100 - 108 mmol/L Final   ??? CO2 07/06/2017 22  21 - 32 mmol/L Final   ??? Anion gap 07/06/2017 8  3.0 - 18 mmol/L Final   ??? Glucose 07/06/2017 115* 74 - 99 mg/dL Final   ??? BUN 07/06/2017 19* 7.0 - 18 MG/DL Final   ??? Creatinine 07/06/2017 1.06  0.6 - 1.3 MG/DL Final   ??? BUN/Creatinine ratio 07/06/2017 18  12 - 20   Final   ??? GFR est AA 07/06/2017 >60  >60 ml/min/1.55m Final   ??? GFR est non-AA 07/06/2017 >60  >60 ml/min/1.729mFinal    Comment: (NOTE)  Estimated GFR is calculated using the Modification of Diet in Renal   Disease (MDRD) Study equation, reported for both African Americans   (GFRAA) and non-African Americans (GFRNA), and normalized to 1.7316m body surface area. The physician must decide which value applies to   the patient. The MDRD study equation should only be used in   individuals age 42 94 older. It has not been validated for the   following: pregnant women, patients with serious comorbid conditions,   or on certain medications, or persons with extremes of body size,   muscle mass, or nutritional status.     ??? Calcium 07/06/2017 9.6  8.5 - 10.1 MG/DL Final   ??? Bilirubin, total 07/06/2017 0.5  0.2 - 1.0 MG/DL Final   ??? ALT (SGPT) 07/06/2017 25  16 - 61 U/L Final   ??? AST (SGOT) 07/06/2017 25  15 - 37 U/L Final   ??? Alk. phosphatase 07/06/2017 165* 45 - 117 U/L Final   ??? Protein, total 07/06/2017 7.8  6.4 - 8.2 g/dL Final    ??? Albumin 07/06/2017 4.6  3.4 - 5.0 g/dL Final   ??? Globulin 07/06/2017 3.2  2.0 - 4.0 g/dL Final   ??? A-G Ratio 07/06/2017 1.4  0.8 - 1.7   Final   ??? Iron 07/06/2017 48* 50 - 175 ug/dL Final    Patients receiving metal-binding drugs (e.g. deferoxamine) may show spuriously depressed iron values, as chelated iron may not properly react in the iron assay.   ??? TIBC 07/06/2017 445  250 - 450 ug/dL Final   ??? Iron % saturation 07/06/2017 11  % Final   ??? Ferritin 07/06/2017 60  8 - 388 NG/ML Final   Hospital Outpatient Visit on 07/06/2017   Component Date Value Ref Range Status   ??? WBC 07/06/2017 2.8* 4.5 - 13.0 K/uL Final   ??? RBC 07/06/2017 3.73* 4.10 - 5.10 M/uL Final   ??? HGB 07/06/2017 10.3* 12.0 - 16.0 g/dL Final   ??? HCT 07/06/2017 33.7* 36 -  48 % Final   ??? MCV 07/06/2017 90.3  78 - 102 FL Final   ??? MCH 07/06/2017 27.6  25.0 - 35.0 PG Final   ??? MCHC 07/06/2017 30.6* 31 - 37 g/dL Final   ??? RDW 07/06/2017 20.0* 11.5 - 14.5 % Final   ??? PLATELET 07/06/2017 82* 140 - 440 K/uL Final   ??? NEUTROPHILS 07/06/2017 78* 40 - 70 % Final   ??? MIXED CELLS 07/06/2017 10  0.1 - 17 % Final   ??? LYMPHOCYTES 07/06/2017 12* 14 - 44 % Final   ??? ABS. NEUTROPHILS 07/06/2017 2.2  1.8 - 9.5 K/UL Final   ??? ABS. MIXED CELLS 07/06/2017 0.3  0.0 - 2.3 K/uL Final   ??? ABS. LYMPHOCYTES 07/06/2017 0.3* 1.1 - 5.9 K/UL Final    Test performed at [place] Location. Results Reviewed by Medical Director.   ??? DF 07/06/2017 AUTOMATED    Final   Hospital Outpatient Visit on 06/22/2017   Component Date Value Ref Range Status   ??? WBC 06/22/2017 3.4* 4.5 - 13.0 K/uL Final   ??? RBC 06/22/2017 3.60* 4.10 - 5.10 M/uL Final   ??? HGB 06/22/2017 9.6* 12.0 - 16.0 g/dL Final   ??? HCT 06/22/2017 32.2* 36 - 48 % Final   ??? MCV 06/22/2017 89.4  78 - 102 FL Final   ??? MCH 06/22/2017 26.7  25.0 - 35.0 PG Final   ??? MCHC 06/22/2017 29.8* 31 - 37 g/dL Final   ??? RDW 06/22/2017 20.0* 11.5 - 14.5 % Final   ??? PLATELET 06/22/2017 87* 140 - 440 K/uL Final    ??? NEUTROPHILS 06/22/2017 74* 40 - 70 % Final   ??? MIXED CELLS 06/22/2017 13  0.1 - 17 % Final   ??? LYMPHOCYTES 06/22/2017 14  14 - 44 % Final   ??? ABS. NEUTROPHILS 06/22/2017 2.5  1.8 - 9.5 K/UL Final   ??? ABS. MIXED CELLS 06/22/2017 0.4  0.0 - 2.3 K/uL Final   ??? ABS. LYMPHOCYTES 06/22/2017 0.5* 1.1 - 5.9 K/UL Final    Test performed at [place] Location. Results Reviewed by Medical Director.   ??? DF 06/22/2017 AUTOMATED    Final   Hospital Outpatient Visit on 06/08/2017   Component Date Value Ref Range Status   ??? WBC 06/08/2017 3.2* 4.5 - 13.0 K/uL Final   ??? RBC 06/08/2017 3.61* 4.10 - 5.10 M/uL Final   ??? HGB 06/08/2017 9.4* 12.0 - 16.0 g/dL Final   ??? HCT 06/08/2017 31.6* 36 - 48 % Final   ??? MCV 06/08/2017 87.5  78 - 102 FL Final   ??? MCH 06/08/2017 26.0  25.0 - 35.0 PG Final   ??? MCHC 06/08/2017 29.7* 31 - 37 g/dL Final   ??? RDW 06/08/2017 19.1* 11.5 - 14.5 % Final   ??? PLATELET 06/08/2017 88* 140 - 440 K/uL Final   ??? NEUTROPHILS 06/08/2017 80* 40 - 70 % Final   ??? MIXED CELLS 06/08/2017 8  0.1 - 17 % Final   ??? LYMPHOCYTES 06/08/2017 12* 14 - 44 % Final   ??? ABS. NEUTROPHILS 06/08/2017 2.5  1.8 - 9.5 K/UL Final   ??? ABS. MIXED CELLS 06/08/2017 0.3  0.0 - 2.3 K/uL Final   ??? ABS. LYMPHOCYTES 06/08/2017 0.4* 1.1 - 5.9 K/UL Final    Test performed at [place] Location. Results Reviewed by Medical Director.   ??? DF 06/08/2017 AUTOMATED    Final   Hospital Outpatient Visit on 05/25/2017  Component Date Value Ref Range Status   ??? WBC 05/25/2017 3.3* 4.5 - 13.0 K/uL Final   ??? RBC 05/25/2017 4.11  4.10 - 5.10 M/uL Final   ??? HGB 05/25/2017 10.4* 12.0 - 16.0 g/dL Final   ??? HCT 05/25/2017 34.9* 36 - 48 % Final   ??? MCV 05/25/2017 84.9  78 - 102 FL Final   ??? [place] 05/25/2017 25.3  25.0 - 35.0 PG Final   ??? MCHC 05/25/2017 29.8* 31 - 37 g/dL Final   ??? RDW 05/25/2017 17.6* 11.5 - 14.5 % Final   ??? PLATELET 05/25/2017 101* 140 - 440 K/uL Final   ??? NEUTROPHILS 05/25/2017 81* 40 - 70 % Final    ??? MIXED CELLS 05/25/2017 9  0.1 - 17 % Final   ??? LYMPHOCYTES 05/25/2017 10* 14 - 44 % Final   ??? ABS. NEUTROPHILS 05/25/2017 2.7  1.8 - 9.5 K/UL Final   ??? ABS. MIXED CELLS 05/25/2017 0.3  0.0 - 2.3 K/uL Final   ??? ABS. LYMPHOCYTES 05/25/2017 0.3* 1.1 - 5.9 K/UL Final    Test performed at [place] Location. Results Reviewed by Medical Director.   ??? DF 05/25/2017 AUTOMATED    Final          Follow-up Disposition: Not on File

## 2017-08-11 ENCOUNTER — Encounter

## 2017-08-12 MED ORDER — METFORMIN 500 MG TAB
500 mg | ORAL_TABLET | ORAL | 0 refills | Status: DC
Start: 2017-08-12 — End: 2017-11-10

## 2017-08-15 ENCOUNTER — Encounter

## 2017-08-15 MED ORDER — FINASTERIDE 5 MG TAB
5 mg | ORAL_TABLET | ORAL | 0 refills | Status: DC
Start: 2017-08-15 — End: 2017-09-19

## 2017-08-17 ENCOUNTER — Inpatient Hospital Stay: Payer: MEDICARE | Primary: Legal Medicine

## 2017-08-19 MED ORDER — EPOETIN ALFA-EPBX 10,000 UNIT/ML INJECTION SOLUTION
10000 unit/mL | Freq: Once | INTRAMUSCULAR | Status: DC
Start: 2017-08-19 — End: 2017-08-19

## 2017-08-19 MED ORDER — EPOETIN ALFA-EPBX 10,000 UNIT/ML INJECTION SOLUTION
10000 unit/mL | Freq: Once | INTRAMUSCULAR | Status: AC
Start: 2017-08-19 — End: 2017-08-23

## 2017-08-19 MED FILL — RETACRIT 40,000 UNIT/ML INJECTION SOLUTION: 40000 unit/mL | INTRAMUSCULAR | Qty: 1

## 2017-08-23 ENCOUNTER — Institutional Professional Consult (permissible substitution): Primary: Legal Medicine

## 2017-08-23 ENCOUNTER — Inpatient Hospital Stay: Admit: 2017-08-23 | Primary: Legal Medicine

## 2017-08-23 DIAGNOSIS — D649 Anemia, unspecified: Secondary | ICD-10-CM

## 2017-08-23 NOTE — Progress Notes (Signed)
[name] did not show up for his appt.  [name] called after 0900 to set up rescheduling.  I will call her tomorrow to reschedule.

## 2017-08-24 ENCOUNTER — Inpatient Hospital Stay: Payer: MEDICARE | Primary: Legal Medicine

## 2017-08-26 ENCOUNTER — Encounter

## 2017-08-26 NOTE — Telephone Encounter (Signed)
Pt's wife stated he will be out of medication next week & there are no available appts.    Pts last appt was 08/10/17, next appt is not sched for BLANK.   Advised pt of 72 hour time frame for refill requests. Please advise.    Requested Prescriptions     Pending Prescriptions Disp Refills   ??? LORazepam (ATIVAN) 1 mg tablet 90 Tab 0     Sig: Take 1 Tab by mouth every eight (8) hours as needed for Anxiety for up to 90 days. Max Daily Amount: 3 mg.

## 2017-08-29 ENCOUNTER — Encounter

## 2017-08-29 MED ORDER — SUCRALFATE 1 GRAM TAB
1 gram | ORAL_TABLET | ORAL | 0 refills | Status: DC
Start: 2017-08-29 — End: 2017-09-27

## 2017-08-29 MED ORDER — LORAZEPAM 1 MG TAB
1 mg | ORAL_TABLET | Freq: Three times a day (TID) | ORAL | 0 refills | Status: DC | PRN
Start: 2017-08-29 — End: 2017-10-03

## 2017-08-29 MED ORDER — PROPRANOLOL 20 MG TAB
20 mg | ORAL_TABLET | ORAL | 0 refills | Status: DC
Start: 2017-08-29 — End: 2017-10-17

## 2017-08-30 NOTE — Telephone Encounter (Signed)
Ativan rx was printed yesterday. Pt's stated pharmacy told her that it can be faxed to the pharmacy. Please verify. Pt can pick up tomorrow only & afraid of withdrawls if he does not get it.

## 2017-08-31 ENCOUNTER — Inpatient Hospital Stay: Admit: 2017-08-31 | Primary: Legal Medicine

## 2017-08-31 ENCOUNTER — Encounter: Payer: MEDICARE | Primary: Legal Medicine

## 2017-08-31 ENCOUNTER — Inpatient Hospital Stay: Admit: 2017-08-31 | Payer: MEDICARE | Primary: Legal Medicine

## 2017-08-31 ENCOUNTER — Ambulatory Visit
Admit: 2017-08-31 | Discharge: 2017-08-31 | Payer: PRIVATE HEALTH INSURANCE | Attending: Hematology & Oncology | Primary: Legal Medicine

## 2017-08-31 DIAGNOSIS — D696 Thrombocytopenia, unspecified: Secondary | ICD-10-CM

## 2017-08-31 DIAGNOSIS — D469 Myelodysplastic syndrome, unspecified: Secondary | ICD-10-CM

## 2017-08-31 LAB — CBC WITH 3 PART DIFF
ABS. LYMPHOCYTES: 0.5 10*3/uL — ABNORMAL LOW (ref 1.1–5.9)
ABS. MIXED CELLS: 0.4 10*3/uL (ref 0.0–2.3)
ABS. NEUTROPHILS: 2.4 10*3/uL (ref 1.8–9.5)
HCT: 31.4 % — ABNORMAL LOW (ref 36–48)
HGB: 9.4 g/dL — ABNORMAL LOW (ref 12.0–16.0)
LYMPHOCYTES: 16 % (ref 14–44)
MCH: 25.4 PG (ref 25.0–35.0)
MCHC: 29.9 g/dL — ABNORMAL LOW (ref 31–37)
MCV: 84.9 FL (ref 78–102)
Mixed cells: 11 % (ref 0.1–17)
NEUTROPHILS: 73 % — ABNORMAL HIGH (ref 40–70)
PLATELET: 102 10*3/uL — ABNORMAL LOW (ref 140–440)
RBC: 3.7 M/uL — ABNORMAL LOW (ref 4.10–5.10)
RDW: 15.4 % — ABNORMAL HIGH (ref 11.5–14.5)
WBC: 3.3 10*3/uL — ABNORMAL LOW (ref 4.5–13.0)

## 2017-08-31 NOTE — Progress Notes (Signed)
Hematology/Oncology  Progress Note  Name: William Reppond Sr.  Date: 08/31/2017  DOB: 1946/02/04    PCP: [name], MD     William Blanchard is a 72 y.o.-year-old man who has myelodysplastic syndrome, chronic leukopenia, and severe anemia.  Current therapy: Procrit 60,000 units every 2 weeks when the hemoglobin is below 10 g/dL.    Subjective:     William Blanchard is a 72 year old man who has myelodysplastic syndrome with severe anemia.  He also has chronic leukopenia and thrombocytopenia.  He is currently receiving Procrit 60,000 units every 2 weeks. His wife reports he is eating well and sleeping better. He denies tiredness, weakness, and fatigue. He denies shortness of breath, chest pain, and dizziness. He does not have any concerns or complaints to report at this time.      Past medical history, family history, and social history: these were reviewed and remains unchanged.    Past Medical History:   Diagnosis Date   ??? Anemia    ??? Anxiety    ??? Chronic pain    ??? Cirrhosis of liver ([place])    ??? Diabetes ([place])    ??? GERD (gastroesophageal reflux disease)    ??? Hyperlipemia    ??? Hypertension    ??? Hypotension    ??? Migraine    ??? Other cirrhosis of liver ([place])    ??? Stroke [facility])     had 3 strokes    ??? Thrombocytopenia ([place])      Past Surgical History:   Procedure Laterality Date   ??? HX CHOLECYSTECTOMY     ??? HX ORTHOPAEDIC      R hand sx   ??? HX OTHER SURGICAL      Hand surgery- Nail gun went through finger     Social History     Socioeconomic History   ??? Marital status: MARRIED     Spouse name: Not on file   ??? Number of children: Not on file   ??? Years of education: Not on file   ??? Highest education level: Not on file   Social Needs   ??? Financial resource strain: Not on file   ??? Food insecurity - worry: Not on file   ??? Food insecurity - inability: Not on file   ??? Transportation needs - medical: Not on file   ??? Transportation needs - non-medical: Not on file   Occupational History   ??? Not on file   Tobacco Use    ??? Smoking status: Former Smoker   ??? Smokeless tobacco: Never Used   ??? Tobacco comment: quit years ago   Substance and Sexual Activity   ??? Alcohol use: No   ??? Drug use: No   ??? Sexual activity: No     Partners: Male   Other Topics Concern   ??? Not on file   Social History Narrative    ** Merged History Encounter **          Family History   Problem Relation Age of Onset   ??? Alzheimer Mother    ??? Diabetes Mother    ??? Cancer Mother         colon cancer    ??? Hypertension Father    ??? Heart Disease Father    ??? Cancer Father         prostate, lung cancer    ??? Diabetes Sister    ??? Heart Disease Sister    ??? Diabetes Brother  Current Outpatient Medications   Medication Sig Dispense Refill   ??? LORazepam (ATIVAN) 1 mg tablet Take 1 Tab by mouth every eight (8) hours as needed for Anxiety for up to 90 days. Max Daily Amount: 3 mg. 90 Tab 0   ??? propranolol (INDERAL) 20 mg tablet TAKE 1 TABLET BY MOUTH THREE TIMES DAILY 90 Tab 0   ??? sucralfate (CARAFATE) 1 gram tablet TAKE 1 TABLET BY MOUTH FOUR TIMES DAILY WITH MEALS AND AT BEDTIME 120 Tab 0   ??? finasteride (PROSCAR) 5 mg tablet TAKE 1 TABLET BY MOUTH DAILY 30 Tab 0   ??? metFORMIN (GLUCOPHAGE) 500 mg tablet TAKE 2 TABLETS BY MOUTH TWICE DAILY WITH MEALS(GENERIC FOR GLUCOPHAGE) 360 Tab 0   ??? SUMAtriptan (IMITREX) 100 mg tablet TAKE 1 TABLET BY MOUTH ONCE AS NEEDED FOR MIGRAINE FOR UP TO 1 DOSE 10 Tab 0   ??? oxyCODONE IR (ROXICODONE) 10 mg tab immediate release tablet Take 10 mg by mouth every six (6) hours as needed for Pain.     ??? GENERLAC 10 gram/15 mL solution TAKE 30CC BY MOUTH TWICE DAILY 1892 mL 0   ??? rosuvastatin (CRESTOR) 10 mg tablet TAKE 1 TABLET BY MOUTH NIGHTLY 90 Tab 0   ??? citalopram (CELEXA) 40 mg tablet TAKE 1 TABLET BY MOUTH DAILY 90 Tab 0   ??? topiramate (TOPAMAX) 100 mg tablet TAKE 1 TABLET BY MOUTH DAILY 90 Tab 0   ??? butalbital-acetaminophen-caffeine (FIORICET, ESGIC) 50-325-40 mg per tablet Take 1 Tab by mouth every six (6) hours as needed for Pain. 20 Tab 1    ??? HUMULIN R REGULAR U-100 INSULN 100 unit/mL injection As needed per sliding scale 1 Vial 6   ??? Insulin Syringe-Needle U-100 1 mL 29 gauge x 1/2" syrg Three times per day and as needed as per sliding scale 100 Syringe 6   ??? prochlorperazine (COMPAZINE) 10 mg tablet Take 0.5 Tabs by mouth every six (6) hours as needed. 30 Tab 5   ??? gemfibrozil (LOPID) 600 mg tablet TAKE 1 TABLET BY MOUTH TWICE DAILY 180 Tab 3   ??? ketoconazole (NIZORAL) 2 % topical cream Apply  to affected area daily. 15 g 0   ??? metaxalone (SKELAXIN) 800 mg tablet Take 800 mg by mouth three (3) times daily as needed.     ??? augmented betamethasone dipropionate (DIPROLENE-AF) 0.05 % ointment Apply  to affected area two (2) times a day.     ??? naloxone (NARCAN) 4 mg/actuation nasal spray Use 1 spray intranasally into 1 nostril. Use a new Narcan nasal spray for subsequent doses and administer into alternating nostrils. May repeat every 2 to 3 minutes as needed for opioid overdose symptoms. 1 Each 0   ??? aspirin 81 mg chewable tablet Take 81 mg by mouth daily.     ??? cholecalciferol, vitamin D3, (VITAMIN D3) 2,000 unit tab Take  by mouth.     ??? B.infantis-B.ani-B.long-B.bifi (PROBIOTIC 4X) 10-15 mg TbEC Take  by mouth.     ??? cyclobenzaprine (FLEXERIL) 10 mg tablet Take  by mouth three (3) times daily as needed for Muscle Spasm(s).         Review of Systems  Constitutional: The patient has no acute distress or discomfort.  HEENT: The patient denies recent head trauma, eye pain, blurred vision,  hearing deficit, oropharyngeal mucosal pain or lesions, and the patient denies throat pain or discomfort.  Lymphatics: The patient denies palpable peripheral lymphadenopathy.  Hematologic: The patient denies having bruising, bleeding,  or progressive fatigue.  Respiratory: Patient denies having shortness of breath, cough, sputum production, fever, or dyspnea on exertion.  Cardiovascular: The patient denies having leg pain, leg swelling, heart  palpitations, chest permit, chest pain, or lightheadedness.  The patient denies having dyspnea on exertion.  Gastrointestinal: The patient denies having nausea, emesis, or diarrhea. The patient denies having any hematemesis or blood in the stool.  Genitourinary: Patient denies having urinary urgency, frequency, or dysuria.  The patient denies having blood in the urine.  Psychological: The patient denies having symptoms of nervousness, anxiety, depression, or thoughts of harming self.  Skin: Patient denies having skin rashes, skin, ulcerations, or unexplained itching or pruritus.  Musculoskeletal: The patient denies having pain in the joints or bones.      Objective:     Visit Vitals  BP 110/68   Pulse 72   Temp 97.4 ??F (36.3 ??C) (Oral)   Resp 16   Ht 5\' 9"  (1.753 m)   Wt 62.1 kg (137 lb)   SpO2 100%   BMI 20.23 kg/m??     ECOG PS=0  Physical Exam:   Gen. Appearance: The patient is in no acute distress.  Skin: There is no bruise or rash.  HEENT: The exam is unremarkable.  Neck: Supple without lymphadenopathy or thyromegaly.  Lungs: Clear to auscultation and percussion; there are no wheezes or rhonchi.  Heart: Regular rate and rhythm; there are no murmurs, gallops, or rubs.  Abdomen: Bowel sounds are present and normal.  There is no guarding, tenderness, or hepatosplenomegaly.  Extremities: There is no clubbing, cyanosis, or edema.  Neurologic: There are no focal neurologic deficits.  Lymphatics: There is no palpable peripheral lymphadenopathy. Musculoskeletal: The patient has full range of motion at all joints.  There is no evidence of joint deformity or effusions.  There is no focal joint tenderness.  Psychological/psychiatric: There is no clinical evidence of anxiety, depression, or melancholy.    Lab data:      Results for orders placed or performed during the hospital encounter of 08/31/17   CBC WITH 3 PART DIFF     Status: Abnormal   Result Value Ref Range Status    WBC 3.3 (L) 4.5 - 13.0 K/uL Final     RBC 3.70 (L) 4.10 - 5.10 M/uL Final    HGB 9.4 (L) 12.0 - 16.0 g/dL Final    HCT 31.4 (L) 36 - 48 % Final    MCV 84.9 78 - 102 FL Final    MCH 25.4 25.0 - 35.0 PG Final    MCHC 29.9 (L) 31 - 37 g/dL Final    RDW 15.4 (H) 11.5 - 14.5 % Final    PLATELET 102 (L) 140 - 440 K/uL Final    NEUTROPHILS 73 (H) 40 - 70 % Final    MIXED CELLS 11 0.1 - 17 % Final    LYMPHOCYTES 16 14 - 44 % Final    ABS. NEUTROPHILS 2.4 1.8 - 9.5 K/UL Final    ABS. MIXED CELLS 0.4 0.0 - 2.3 K/uL Final    ABS. LYMPHOCYTES 0.5 (L) 1.1 - 5.9 K/UL Final     Comment: Test performed at [place] or Outpatient [facility]fusion [facility] Location. Reviewed by Medical Director.    DF AUTOMATED   Final           Assessment:     1. Thrombocytopenia ([place])    2. Chronic leukopenia    3. Myelodysplastic syndrome ([place])  Plan:     Thrombocytopenia: The patient has a stable platelet count of 102,000. No therapeutic intervention is warranted.    Chronic leukopenia: The CBC from today shows that his WBC count is 3.3 with an absolute neutrophil count which is normal at 2.4. However the absolute lymphocyte count is low at 0.5. We will continue to monitor every 2 months. Therapeutic intervention is not warranted.      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 I will check his iron profile and ferritin levels at this time along with comprehensive metabolic panel. If his ferritin level is below 25 we will need to give him intravenous iron to replenish his ferritin level.      Follow-up in 2 months or sooner if indicated.    Orders Placed This Encounter   ??? COMPLETE CBC & AUTO DIFF WBC   ??? InHouse CBC ([facility])     Standing Status:   Future     Number of Occurrences:   1     Standing Expiration Date:   09/07/2017   ??? IRON PROFILE     Standing Status:   Future      Standing Expiration Date:   09/01/6438   ??? METABOLIC PANEL, COMPREHENSIVE     Standing Status:   Future     Standing Expiration Date:   09/01/2018   ??? FERRITIN     Standing Status:   Future     Standing Expiration Date:   09/01/2018         Hilliard Clark, NP  08/31/2017       I have assessed the patient independently and  agree with the full assessment as outlined.  Joycelyn Das, MD, FACP                                                                                        Hematology/Oncology  Progress Note  Name: William Riojas Sr.  Date: 08/31/2017  DOB: [birth year]    PCP: [name], MD     William Blanchard is a 73 y.o.-year-old man who has myelodysplastic syndrome, chronic leukopenia, and severe anemia.  Current therapy: Procrit 60,000 units every 2 weeks when the hemoglobin is below 10 g/dL.    Subjective:     William Blanch[place] is a 72 year old man who has myelodysplastic syndrome with severe anemia.  He also has chronic leukopenia and thrombocytopenia.  He is currently receiving Procrit 60,000 units every 2 weeks.  Today he is complaining of some fatigue and weakness.  His wife reports that although he is eating well he is continuing to lose weight and he is hypersomnolent, sleeping all of the time.    Past medical history, family history, and social history: these were reviewed and remains unchanged.    Past Medical History:   Diagnosis Date   ??? Anemia    ??? Anxiety    ??? Chronic pain    ??? Cirrhosis of liver ([place])    ??? Diabetes ([place])    ??? GERD (gastroesophageal reflux disease)    ???  Hyperlipemia    ??? Hypertension    ??? Hypotension    ??? Migraine    ??? Other cirrhosis of liver ([place])    ??? Stroke [facility])     had 3 strokes    ??? Thrombocytopenia ([place])      Past Surgical History:   Procedure Laterality Date   ??? HX CHOLECYSTECTOMY     ??? HX ORTHOPAEDIC      R hand sx   ??? HX OTHER SURGICAL      Hand surgery- Nail gun went through finger     Social History     Socioeconomic History   ??? Marital status: MARRIED      Spouse name: Not on file   ??? Number of children: Not on file   ??? Years of education: Not on file   ??? Highest education level: Not on file   Social Needs   ??? Financial resource strain: Not on file   ??? Food insecurity - worry: Not on file   ??? Food insecurity - inability: Not on file   ??? Transportation needs - medical: Not on file   ??? Transportation needs - non-medical: Not on file   Occupational History   ??? Not on file   Tobacco Use   ??? Smoking status: Former Smoker   ??? Smokeless tobacco: Never Used   ??? Tobacco comment: quit years ago   Substance and Sexual Activity   ??? Alcohol use: No   ??? Drug use: No   ??? Sexual activity: No     Partners: Male   Other Topics Concern   ??? Not on file   Social History Narrative    ** Merged History Encounter **          Family History   Problem Relation Age of Onset   ??? Alzheimer Mother    ??? Diabetes Mother    ??? Cancer Mother         colon cancer    ??? Hypertension Father    ??? Heart Disease Father    ??? Cancer Father         prostate, lung cancer    ??? Diabetes Sister    ??? Heart Disease Sister    ??? Diabetes Brother      Current Outpatient Medications   Medication Sig Dispense Refill   ??? LORazepam (ATIVAN) 1 mg tablet Take 1 Tab by mouth every eight (8) hours as needed for Anxiety for up to 90 days. Max Daily Amount: 3 mg. 90 Tab 0   ??? propranolol (INDERAL) 20 mg tablet TAKE 1 TABLET BY MOUTH THREE TIMES DAILY 90 Tab 0   ??? sucralfate (CARAFATE) 1 gram tablet TAKE 1 TABLET BY MOUTH FOUR TIMES DAILY WITH MEALS AND AT BEDTIME 120 Tab 0   ??? finasteride (PROSCAR) 5 mg tablet TAKE 1 TABLET BY MOUTH DAILY 30 Tab 0   ??? metFORMIN (GLUCOPHAGE) 500 mg tablet TAKE 2 TABLETS BY MOUTH TWICE DAILY WITH MEALS(GENERIC FOR GLUCOPHAGE) 360 Tab 0   ??? SUMAtriptan (IMITREX) 100 mg tablet TAKE 1 TABLET BY MOUTH ONCE AS NEEDED FOR MIGRAINE FOR UP TO 1 DOSE 10 Tab 0   ??? oxyCODONE IR (ROXICODONE) 10 mg tab immediate release tablet Take 10 mg by mouth every six (6) hours as needed for Pain.      ??? GENERLAC 10 gram/15 mL solution TAKE 30CC BY MOUTH TWICE DAILY 1892 mL 0   ??? rosuvastatin (CRESTOR) 10 mg tablet TAKE 1 TABLET BY  MOUTH NIGHTLY 90 Tab 0   ??? citalopram (CELEXA) 40 mg tablet TAKE 1 TABLET BY MOUTH DAILY 90 Tab 0   ??? topiramate (TOPAMAX) 100 mg tablet TAKE 1 TABLET BY MOUTH DAILY 90 Tab 0   ??? butalbital-acetaminophen-caffeine (FIORICET, ESGIC) 50-325-40 mg per tablet Take 1 Tab by mouth every six (6) hours as needed for Pain. 20 Tab 1   ??? HUMULIN R REGULAR U-100 INSULN 100 unit/mL injection As needed per sliding scale 1 Vial 6   ??? Insulin Syringe-Needle U-100 1 mL 29 gauge x 1/2" syrg Three times per day and as needed as per sliding scale 100 Syringe 6   ??? prochlorperazine (COMPAZINE) 10 mg tablet Take 0.5 Tabs by mouth every six (6) hours as needed. 30 Tab 5   ??? gemfibrozil (LOPID) 600 mg tablet TAKE 1 TABLET BY MOUTH TWICE DAILY 180 Tab 3   ??? ketoconazole (NIZORAL) 2 % topical cream Apply  to affected area daily. 15 g 0   ??? metaxalone (SKELAXIN) 800 mg tablet Take 800 mg by mouth three (3) times daily as needed.     ??? augmented betamethasone dipropionate (DIPROLENE-AF) 0.05 % ointment Apply  to affected area two (2) times a day.     ??? naloxone (NARCAN) 4 mg/actuation nasal spray Use 1 spray intranasally into 1 nostril. Use a new Narcan nasal spray for subsequent doses and administer into alternating nostrils. May repeat every 2 to 3 minutes as needed for opioid overdose symptoms. 1 Each 0   ??? aspirin 81 mg chewable tablet Take 81 mg by mouth daily.     ??? cholecalciferol, vitamin D3, (VITAMIN D3) 2,000 unit tab Take  by mouth.     ??? B.infantis-B.ani-B.long-B.bifi (PROBIOTIC 4X) 10-15 mg TbEC Take  by mouth.     ??? cyclobenzaprine (FLEXERIL) 10 mg tablet Take  by mouth three (3) times daily as needed for Muscle Spasm(s).         Review of Systems  Constitutional: The patient has no acute distress or discomfort.  HEENT: The patient denies recent head trauma, eye pain, blurred vision,   hearing deficit, oropharyngeal mucosal pain or lesions, and the patient denies throat pain or discomfort.  Lymphatics: The patient denies palpable peripheral lymphadenopathy.  Hematologic: The patient denies having bruising, bleeding, or progressive fatigue.  Respiratory: Patient denies having shortness of breath, cough, sputum production, fever, or dyspnea on exertion.  Cardiovascular: The patient denies having leg pain, leg swelling, heart palpitations, chest permit, chest pain, or lightheadedness.  The patient denies having dyspnea on exertion.  Gastrointestinal: The patient denies having nausea, emesis, or diarrhea. The patient denies having any hematemesis or blood in the stool.  Genitourinary: Patient denies having urinary urgency, frequency, or dysuria.  The patient denies having blood in the urine.  Psychological: The patient denies having symptoms of nervousness, anxiety, depression, or thoughts of harming self.  Skin: Patient denies having skin rashes, skin, ulcerations, or unexplained itching or pruritus.  Musculoskeletal: The patient denies having pain in the joints or bones.      Objective:     Visit Vitals  BP 110/68   Pulse 72   Temp 97.4 ??F (36.3 ??C) (Oral)   Resp 16   Ht 5\' 9"  (1.753 m)   Wt 62.1 kg (137 lb)   SpO2 100%   BMI 20.23 kg/m??     ECOG PS=0  Physical Exam:   Gen. Appearance: The patient is in no acute distress.  Skin: There is no bruise  or rash.  HEENT: The exam is unremarkable.  Neck: Supple without lymphadenopathy or thyromegaly.  Lungs: Clear to auscultation and percussion; there are no wheezes or rhonchi.  Heart: Regular rate and rhythm; there are no murmurs, gallops, or rubs.  Abdomen: Bowel sounds are present and normal.  There is no guarding, tenderness, or hepatosplenomegaly.  Extremities: There is no clubbing, cyanosis, or edema.  Neurologic: There are no focal neurologic deficits.  Lymphatics: There is no palpable peripheral lymphadenopathy. Musculoskeletal: The  patient has full range of motion at all joints.  There is no evidence of joint deformity or effusions.  There is no focal joint tenderness.  Psychological/psychiatric: There is no clinical evidence of anxiety, depression, or melancholy.    Lab data:      Results for orders placed or performed during the hospital encounter of 08/31/17   CBC WITH 3 PART DIFF     Status: Abnormal   Result Value Ref Range Status    WBC 3.3 (L) 4.5 - 13.0 K/uL Final    RBC 3.70 (L) 4.10 - 5.10 M/uL Final    HGB 9.4 (L) 12.0 - 16.0 g/dL Final    HCT 31.4 (L) 36 - 48 % Final    MCV 84.9 78 - 102 FL Final    MCH 25.4 25.0 - 35.0 PG Final    MCHC 29.9 (L) 31 - 37 g/dL Final    RDW 15.4 (H) 11.5 - 14.5 % Final    PLATELET 102 (L) 140 - 440 K/uL Final    NEUTROPHILS 73 (H) 40 - 70 % Final    MIXED CELLS 11 0.1 - 17 % Final    LYMPHOCYTES 16 14 - 44 % Final    ABS. NEUTROPHILS 2.4 1.8 - 9.5 K/UL Final    ABS. MIXED CELLS 0.4 0.0 - 2.3 K/uL Final    ABS. LYMPHOCYTES 0.5 (L) 1.1 - 5.9 K/UL Final     Comment: Test performed at [facility] or Outpatient Infusion Center Location. Reviewed by Medical Director.    DF AUTOMATED   Final           Assessment:     1. Thrombocytopenia ([place])    2. Chronic leukopenia    3. 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  He did receive Procrit at a dose of 60,000 units yesterday.  I will check his iron profile and ferritin levels at this time.  If his ferritin level is below 100 and we will need to give him intravenous iron to replenish his ferritin level.    Chronic leukopenia: The CBC from today shows that his WBC count is 3.3 with an absolute neutrophil count which is normal at 2.6.  However the absolute lymphocyte count is low at 0.5.  These will continue to be  monitored.  Therapeutic intervention is not warranted.    Thrombocytopenia: The patient has a stable lytic count of 85,000.  No therapeutic intervention is warranted.    Follow-up in 2 months  Orders Placed This Encounter   ??? COMPLETE CBC & AUTO DIFF WBC   ??? InHouse CBC ([facility])     Standing Status:   Future     Number of Occurrences:   1     Standing Expiration Date:   09/07/2017   ???  IRON PROFILE     Standing Status:   Future     Standing Expiration Date:   07/06/6220   ??? METABOLIC PANEL, COMPREHENSIVE     Standing Status:   Future     Standing Expiration Date:   09/01/2018   ??? FERRITIN     Standing Status:   Future     Standing Expiration Date:   09/01/2018       Hilliard Clark, NP  08/31/2017      Please note: This document has been produced using voice recognition software.  Unrecognized errors in transcription may be present.

## 2017-08-31 NOTE — Patient Instructions (Signed)
Complete Blood Count (CBC): About This Test  What is it?    A complete blood count (CBC) is a blood test that gives important information about your blood cells, especially red blood cells, white blood cells, and platelets.  Why is this test done?  A CBC may be done as part of a regular physical exam. There are many other reasons that a doctor may want this blood test, including to:  ?? Find the cause of symptoms such as fatigue, weakness, fever, bruising, or weight loss.  ?? Find anemia or an infection.  ?? See how much blood has been lost if there is bleeding.  ?? Diagnose diseases of the blood, such as leukemia or polycythemia.  How can you prepare for the test?  You do not need to do anything before having this test.  What happens during the test?  The health professional taking a sample of your blood will:  ?? Wrap an elastic band around your upper arm. This makes the veins below the band larger so it is easier to put a needle into the vein.  ?? Clean the needle site with alcohol.  ?? Put the needle into the vein.  ?? Attach a tube to the needle to fill it with blood.  ?? Remove the band from your arm when enough blood is collected.  ?? Put a gauze pad or cotton ball over the needle site as the needle is removed.  ?? Put pressure on the site and then put on a bandage.  If this blood test is done on a baby, a heel stick may be done instead of a blood draw from a vein.  What happens after the test?  ?? You will probably be able to go home right away.  ?? You can go back to your usual activities right away.  Follow-up care is a key part of your treatment and safety. Be sure to make and go to all appointments, and call your doctor if you are having problems. It's also a good idea to keep a list of the medicines you take. Ask your doctor when you can expect to have your test results.  Where can you learn more?  Go to http://www.healthwise.net/[employer].   Enter Q692 in the search box to learn more about "Complete Blood Count (CBC): About This Test."  Current as of: December 20, 2016  Content Version: 11.9  ?? 2006-2018 [employer]. Care instructions adapted under license by [employer] (which disclaims liability or warranty for this information). If you have questions about a medical condition or this instruction, always ask your healthcare professional. [employer] disclaims any warranty or liability for your use of this information.

## 2017-08-31 NOTE — Telephone Encounter (Signed)
Advised pt's that rx must be pick up from office. She understood & will pick up today.

## 2017-09-01 LAB — METABOLIC PANEL, COMPREHENSIVE
A-G Ratio: 1.7 (ref 0.8–1.7)
ALT (SGPT): 27 U/L (ref 16–61)
AST (SGOT): 22 U/L (ref 15–37)
Albumin: 4.7 g/dL (ref 3.4–5.0)
Alk. phosphatase: 178 U/L — ABNORMAL HIGH (ref 45–117)
Anion gap: 9 mmol/L (ref 3.0–18)
BUN/Creatinine ratio: 19 (ref 12–20)
BUN: 21 MG/DL — ABNORMAL HIGH (ref 7.0–18)
Bilirubin, total: 0.4 MG/DL (ref 0.2–1.0)
CO2: 22 mmol/L (ref 21–32)
Calcium: 9.5 MG/DL (ref 8.5–10.1)
Chloride: 106 mmol/L (ref 100–108)
Creatinine: 1.1 MG/DL (ref 0.6–1.3)
GFR est AA: >60 mL/min/{1.73_m2} (ref >60)
GFR est non-AA: >60 mL/min/{1.73_m2} (ref >60)
Globulin: 2.8 g/dL (ref 2.0–4.0)
Glucose: 108 mg/dL — ABNORMAL HIGH (ref 74–99)
Potassium: 5.2 mmol/L (ref 3.5–5.5)
Protein, total: 7.5 g/dL (ref 6.4–8.2)
Sodium: 137 mmol/L (ref 136–145)

## 2017-09-01 LAB — FERRITIN: Ferritin: 8 NG/ML (ref 8–388)

## 2017-09-01 LAB — IRON PROFILE
Iron % saturation: 7 %
Iron: 35 ug/dL — ABNORMAL LOW (ref 50–175)
TIBC: 508 ug/dL — ABNORMAL HIGH (ref 250–450)

## 2017-09-01 NOTE — Progress Notes (Signed)
Please schedule William [place] for Venofer Infusion. Ferrtin = 8.  Thanks!

## 2017-09-01 NOTE — Progress Notes (Signed)
Orders were sent for patient to be scheduled

## 2017-09-01 NOTE — Progress Notes (Signed)
Patient will be scheduled for iron infusion in the form of Venofer.

## 2017-09-05 MED ORDER — SODIUM CHLORIDE 0.9 % IV
100 mg iron/5 mL | Freq: Once | INTRAVENOUS | Status: AC
Start: 2017-09-05 — End: 2017-09-06
  Administered 2017-09-06: 16:00:00 via INTRAVENOUS

## 2017-09-05 MED FILL — VENOFER 100 MG IRON/5 ML INTRAVENOUS SOLUTION: 100 mg iron/5 mL | INTRAVENOUS | Qty: 12.5

## 2017-09-06 ENCOUNTER — Inpatient Hospital Stay: Admit: 2017-09-06 | Payer: MEDICARE | Primary: Legal Medicine

## 2017-09-06 ENCOUNTER — Institutional Professional Consult (permissible substitution): Admit: 2017-09-06 | Discharge: 2017-09-06 | Payer: PRIVATE HEALTH INSURANCE | Primary: Legal Medicine

## 2017-09-06 ENCOUNTER — Inpatient Hospital Stay: Admit: 2017-09-06 | Primary: Legal Medicine

## 2017-09-06 DIAGNOSIS — D649 Anemia, unspecified: Secondary | ICD-10-CM

## 2017-09-06 DIAGNOSIS — D508 Other iron deficiency anemias: Secondary | ICD-10-CM

## 2017-09-06 LAB — CBC WITH 3 PART DIFF
ABS. LYMPHOCYTES: 0.4 10*3/uL — ABNORMAL LOW (ref 1.1–5.9)
ABS. MIXED CELLS: 0.4 10*3/uL (ref 0.0–2.3)
ABS. NEUTROPHILS: 2.6 10*3/uL (ref 1.8–9.5)
HCT: 31.4 % — ABNORMAL LOW (ref 36–48)
HGB: 9.1 g/dL — ABNORMAL LOW (ref 12.0–16.0)
LYMPHOCYTES: 13 % — ABNORMAL LOW (ref 14–44)
MCH: 24.3 PG — ABNORMAL LOW (ref 25.0–35.0)
MCHC: 29 g/dL — ABNORMAL LOW (ref 31–37)
MCV: 84 FL (ref 78–102)
Mixed cells: 12 % (ref 0.1–17)
NEUTROPHILS: 76 % — ABNORMAL HIGH (ref 40–70)
PLATELET: 88 10*3/uL — ABNORMAL LOW (ref 140–440)
RBC: 3.74 M/uL — ABNORMAL LOW (ref 4.10–5.10)
RDW: 15.6 % — ABNORMAL HIGH (ref 11.5–14.5)
WBC: 3.4 10*3/uL — ABNORMAL LOW (ref 4.5–13.0)

## 2017-09-06 MED ORDER — SODIUM CHLORIDE 0.9 % IJ SYRG
INTRAMUSCULAR | Status: DC | PRN
Start: 2017-09-06 — End: 2017-09-10
  Administered 2017-09-06: 16:00:00 via INTRAVENOUS

## 2017-09-06 MED FILL — BD POSIFLUSH NORMAL SALINE 0.9 % INJECTION SYRINGE: INTRAMUSCULAR | Qty: 40

## 2017-09-06 NOTE — Progress Notes (Signed)
[facility] Progress Note    Date: September 06, 2017    Name: William Kathan [facility]    MRN: [id number]         DOB: 1946-02-28      William Blanchard was assessed and education was provided.     William Blanchard vitals were reviewed and patient was observed for 5 minutes prior to treatment.   Visit Vitals  BP 109/57 (BP 1 Location: Right arm, BP Patient Position: At rest;Sitting)   Pulse 60   Temp 97.1 ??F (36.2 ??C)   Resp 16   SpO2 100%       Lab results were obtained and reviewed.  Recent Results (from the past 12 hour(s))   CBC WITH 3 PART DIFF    Collection Time: 09/06/17 11:46 AM   Result Value Ref Range    WBC 3.4 (L) 4.5 - 13.0 K/uL    RBC 3.74 (L) 4.10 - 5.10 M/uL    HGB 9.1 (L) 12.0 - 16.0 g/dL    HCT 31.4 (L) 36 - 48 %    MCV 84.0 78 - 102 FL    MCH 24.3 (L) 25.0 - 35.0 PG    MCHC 29.0 (L) 31 - 37 g/dL    RDW 15.6 (H) 11.5 - 14.5 %    PLATELET 88 (L) 140 - 440 K/uL    NEUTROPHILS 76 (H) 40 - 70 %    MIXED CELLS 12 0.1 - 17 %    LYMPHOCYTES 13 (L) 14 - 44 %    ABS. NEUTROPHILS 2.6 1.8 - 9.5 K/UL    ABS. MIXED CELLS 0.4 0.0 - 2.3 K/uL    ABS. LYMPHOCYTES 0.4 (L) 1.1 - 5.9 K/UL    DF AUTOMATED         Procrit not given for H&H 9.1/31.4.    Dose #1 of 4 Venofer 250 mg IV given via peripheral line after brisk blood return obtained.    William Blanchard tolerated the infusion, and had no complaints.  Patient armband removed and shredded.    William Blanchard was discharged from [place] in stable condition at 1400. He is to return on 09/20/17 at 1100 for his next appointment for CBC/Procrit and Venofer dose #2 of 4 Q 2 wks.    Lynnda Shields, RN  September 06, 2017  2:29 PM

## 2017-09-08 MED ORDER — GENERLAC 10 GRAM/15 ML ORAL SOLUTION
10 gram/15 mL | ORAL | 0 refills | Status: DC
Start: 2017-09-08 — End: 2017-10-06

## 2017-09-13 MED ORDER — IRON SUCROSE 100 MG/5 ML IV SOLN
100 mg iron/5 mL | Freq: Once | INTRAVENOUS | Status: AC
Start: 2017-09-13 — End: 2017-09-20
  Administered 2017-09-20: 16:00:00 via INTRAVENOUS

## 2017-09-13 MED FILL — VENOFER 100 MG IRON/5 ML INTRAVENOUS SOLUTION: 100 mg iron/5 mL | INTRAVENOUS | Qty: 12.5

## 2017-09-19 ENCOUNTER — Encounter

## 2017-09-19 MED ORDER — FINASTERIDE 5 MG TAB
5 mg | ORAL_TABLET | ORAL | 0 refills | Status: DC
Start: 2017-09-19 — End: 2017-10-17

## 2017-09-20 ENCOUNTER — Inpatient Hospital Stay: Admit: 2017-09-20 | Primary: Legal Medicine

## 2017-09-20 ENCOUNTER — Institutional Professional Consult (permissible substitution): Admit: 2017-09-20 | Discharge: 2017-09-20 | Payer: PRIVATE HEALTH INSURANCE | Primary: Legal Medicine

## 2017-09-20 ENCOUNTER — Inpatient Hospital Stay: Admit: 2017-09-20 | Payer: MEDICARE | Primary: Legal Medicine

## 2017-09-20 DIAGNOSIS — D649 Anemia, unspecified: Secondary | ICD-10-CM

## 2017-09-20 LAB — CBC WITH 3 PART DIFF
ABS. LYMPHOCYTES: 0.4 10*3/uL — ABNORMAL LOW (ref 1.1–5.9)
ABS. MIXED CELLS: 0.4 10*3/uL (ref 0.0–2.3)
ABS. NEUTROPHILS: 2.6 10*3/uL (ref 1.8–9.5)
HCT: 33.1 % — ABNORMAL LOW (ref 36–48)
HGB: 9.8 g/dL — ABNORMAL LOW (ref 12.0–16.0)
LYMPHOCYTES: 12 % — ABNORMAL LOW (ref 14–44)
MCH: 25.4 PG (ref 25.0–35.0)
MCHC: 29.6 g/dL — ABNORMAL LOW (ref 31–37)
MCV: 85.8 FL (ref 78–102)
Mixed cells: 12 % (ref 0.1–17)
NEUTROPHILS: 76 % — ABNORMAL HIGH (ref 40–70)
PLATELET: 97 10*3/uL — ABNORMAL LOW (ref 140–440)
RBC: 3.86 M/uL — ABNORMAL LOW (ref 4.10–5.10)
RDW: 17.3 % — ABNORMAL HIGH (ref 11.5–14.5)
WBC: 3.4 10*3/uL — ABNORMAL LOW (ref 4.5–13.0)

## 2017-09-20 MED ORDER — SODIUM CHLORIDE 0.9 % IV
INTRAVENOUS | Status: DC | PRN
Start: 2017-09-20 — End: 2017-09-21

## 2017-09-20 MED ORDER — SODIUM CHLORIDE 0.9 % IJ SYRG
INTRAMUSCULAR | Status: DC | PRN
Start: 2017-09-20 — End: 2017-09-24
  Administered 2017-09-20 (×2): via INTRAVENOUS

## 2017-09-20 MED ORDER — EPOETIN ALFA-EPBX 40,000 UNIT/ML INJECTION SOLUTION
40000 unit/mL | Freq: Once | INTRAMUSCULAR | Status: AC
Start: 2017-09-20 — End: 2017-09-20

## 2017-09-20 MED FILL — BD POSIFLUSH NORMAL SALINE 0.9 % INJECTION SYRINGE: INTRAMUSCULAR | Qty: 40

## 2017-09-20 MED FILL — SODIUM CHLORIDE 0.9 % IV: INTRAVENOUS | Qty: 250

## 2017-09-20 NOTE — Progress Notes (Signed)
[facility] Progress Note    Date: September 20, 2017    Name: William Kopka [facility]    MRN: [id number]         DOB: [birth year]      [name] was assessed and education was provided.     [name] vitals were reviewed and patient was observed for 5 minutes prior to treatment.   Visit Vitals  BP 103/61 (BP 1 Location: Right arm, BP Patient Position: At rest;Standing)   Pulse 62   Temp 98 ??F (36.7 ??C)   Resp 16   SpO2 97%       Lab results were obtained and reviewed.  Recent Results (from the past 12 hour(s))   CBC WITH 3 PART DIFF    Collection Time: 09/20/17 11:29 AM   Result Value Ref Range    WBC 3.4 (L) 4.5 - 13.0 K/uL    RBC 3.86 (L) 4.10 - 5.10 M/uL    HGB 9.8 (L) 12.0 - 16.0 g/dL    HCT 33.1 (L) 36 - 48 %    MCV 85.8 78 - 102 FL    MCH 25.4 25.0 - 35.0 PG    MCHC 29.6 (L) 31 - 37 g/dL    RDW 17.3 (H) 11.5 - 14.5 %    PLATELET 97 (L) 140 - 440 K/uL    NEUTROPHILS 76 (H) 40 - 70 %    MIXED CELLS 12 0.1 - 17 %    LYMPHOCYTES 12 (L) 14 - 44 %    ABS. NEUTROPHILS 2.6 1.8 - 9.5 K/UL    ABS. MIXED CELLS 0.4 0.0 - 2.3 K/uL    ABS. LYMPHOCYTES 0.4 (L) 1.1 - 5.9 K/UL    DF AUTOMATED         Procrit not given for H&H 9.8/33.1.    Dose #2 of 4 Venofer 250 mg IV given via peripheral line after brisk blood return obtained.    [name] tolerated the infusion, and had no complaints.  Patient armband removed and shredded.    Mr William Blanchard politey declined staying for observation after infusion.    [name] was discharged from [place] in stable condition at 1315. He is to return on 10/04/17 at 1100 for his next appointment for CBC/Procrit and Venofer dose #3 of 4 Q 2 wks.    Lynnda Shields, RN  September 20, 2017

## 2017-09-25 ENCOUNTER — Encounter

## 2017-09-26 MED ORDER — CITALOPRAM 40 MG TAB
40 mg | ORAL_TABLET | ORAL | 0 refills | Status: DC
Start: 2017-09-26 — End: 2017-12-19

## 2017-09-26 MED ORDER — TOPIRAMATE 100 MG TAB
100 mg | ORAL_TABLET | ORAL | 0 refills | Status: DC
Start: 2017-09-26 — End: 2017-12-19

## 2017-09-26 NOTE — Progress Notes (Signed)
Chart Audit for Suspect Condition completed.

## 2017-09-27 ENCOUNTER — Encounter

## 2017-09-27 MED ORDER — SODIUM CHLORIDE 0.9 % IV
1005 mg iron/5 mL | Freq: Once | INTRAVENOUS | Status: AC
Start: 2017-09-27 — End: 2017-10-04
  Administered 2017-10-04: 18:00:00 via INTRAVENOUS

## 2017-09-27 MED FILL — VENOFER 100 MG IRON/5 ML INTRAVENOUS SOLUTION: 100 mg iron/5 mL | INTRAVENOUS | Qty: 12.5

## 2017-09-28 MED ORDER — SUCRALFATE 1 GRAM TAB
1 gram | ORAL_TABLET | ORAL | 0 refills | Status: DC
Start: 2017-09-28 — End: 2017-10-26

## 2017-09-28 MED ORDER — SUMATRIPTAN 100 MG TAB
100 mg | ORAL_TABLET | ORAL | 0 refills | Status: DC
Start: 2017-09-28 — End: 2017-10-26

## 2017-10-03 ENCOUNTER — Ambulatory Visit
Admit: 2017-10-03 | Discharge: 2017-10-03 | Payer: PRIVATE HEALTH INSURANCE | Attending: Legal Medicine | Primary: Legal Medicine

## 2017-10-03 DIAGNOSIS — G2581 Restless legs syndrome: Secondary | ICD-10-CM

## 2017-10-03 MED ORDER — LORAZEPAM 1 MG TAB
1 mg | ORAL_TABLET | Freq: Three times a day (TID) | ORAL | 0 refills | Status: DC | PRN
Start: 2017-10-03 — End: 2017-12-02

## 2017-10-03 MED ORDER — INSULIN NEEDLES (DISPOSABLE) 31 X 5/16"
31 gauge x 5/16" | PACK | 11 refills | Status: DC
Start: 2017-10-03 — End: 2017-10-17

## 2017-10-03 MED ORDER — PRAMIPEXOLE 0.125 MG TAB
0.125 mg | ORAL_TABLET | Freq: Every evening | ORAL | 1 refills | Status: DC
Start: 2017-10-03 — End: 2017-11-28

## 2017-10-03 NOTE — Progress Notes (Signed)
William Blalock Kaps Sr.     Chief Complaint   Patient presents with   ??? Other     legs jump at night     Vitals:    10/03/17 0929   BP: 107/68   Pulse: 66   Resp: 17   Temp: 98 ??F (36.7 ??C)   TempSrc: Oral   SpO2: 100%   Weight: 135 lb (61.2 kg)   Height: '5\' 9"'$  (1.753 m)   PainSc:   8         EAV:[id number] is here accompanied by his wife coming because of worsening  Restless Leg syndrome,He said he always had restless leg but now is getting really worse,That it got worse when his pain medication got reduced he was taking Roxicodone 20 mg and has been decreased to 10 mg.      Patient also needs refill on Ativan for anxiety he has been taking this medication for many years he also takes Celexa 40 mg daily,Discussed with patient and his spouse about reducing Ativan intake should start by decreasing the dose by dividing the tablet in half or decrease the frequency currently patient is taking it 3 times daily.        Patient also has migraine and his symptoms has been worsening patient need to consult with neurologist.    Past Medical History:   Diagnosis Date   ??? Anemia    ??? Anxiety    ??? Chronic pain    ??? Cirrhosis of liver ([place])    ??? Diabetes ([place])    ??? GERD (gastroesophageal reflux disease)    ??? Hyperlipemia    ??? Hypertension    ??? Hypotension    ??? Migraine    ??? Other cirrhosis of liver ([place])    ??? Stroke [facility])     had 3 strokes    ??? Thrombocytopenia ([place])      Past Surgical History:   Procedure Laterality Date   ??? HX CHOLECYSTECTOMY     ??? HX ORTHOPAEDIC      R hand sx   ??? HX OTHER SURGICAL      Hand surgery- Nail gun went through finger     Social History     Tobacco Use   ??? Smoking status: Former Smoker   ??? Smokeless tobacco: Never Used   ??? Tobacco comment: quit years ago   Substance Use Topics   ??? Alcohol use: No       Family History   Problem Relation Age of Onset   ??? Alzheimer Mother    ??? Diabetes Mother    ??? Cancer Mother         colon cancer    ??? Hypertension Father    ??? Heart Disease Father    ??? Cancer Father          prostate, lung cancer    ??? Diabetes Sister    ??? Heart Disease Sister    ??? Diabetes Brother        Review of Systems   Constitutional: Negative for chills, fever, malaise/fatigue and weight loss.   HENT: Negative for congestion, ear discharge, ear pain, hearing loss, nosebleeds, sinus pain and sore throat.    Eyes: Negative for blurred vision, double vision and discharge.   Respiratory: Negative for cough, hemoptysis, sputum production and shortness of breath.    Cardiovascular: Positive for palpitations. Negative for chest pain, claudication and leg swelling.   Gastrointestinal: Positive for abdominal pain. Negative for blood in stool,  constipation, diarrhea, nausea and vomiting.   Genitourinary: Negative for dysuria, frequency and urgency.   Musculoskeletal: Positive for back pain and joint pain. Negative for myalgias.   Skin: Negative for itching and rash.   Neurological: Negative for dizziness, tingling, sensory change, speech change, focal weakness, weakness and headaches.   Psychiatric/Behavioral: Negative for depression and suicidal ideas. The patient is nervous/anxious.        Physical Exam   Constitutional: He is oriented to person, place, and time. He appears well-developed and well-nourished. No distress.   HENT:   Head: Normocephalic and atraumatic.   Mouth/Throat: No oropharyngeal exudate.   Eyes: Pupils are equal, round, and reactive to light. Conjunctivae are normal. Right eye exhibits no discharge. Left eye exhibits no discharge. No scleral icterus.   Neck: Normal range of motion. Neck supple. No thyromegaly present.   Cardiovascular: Normal rate, regular rhythm and normal heart sounds.   Pulmonary/Chest: Effort normal and breath sounds normal. No respiratory distress. He has no rales.   Abdominal: Soft. Bowel sounds are normal. He exhibits no distension and no mass. There is no tenderness. There is no rebound.   Musculoskeletal: Normal range of motion. He exhibits no edema, tenderness  or deformity.   Lymphadenopathy:     He has no cervical adenopathy.   Neurological: He is alert and oriented to person, place, and time. No cranial nerve deficit. Coordination normal.   Skin: Skin is warm and dry. No rash noted. He is not diaphoretic. No erythema.   Psychiatric: He has a normal mood and affect. Judgment and thought content normal.   Nursing note and vitals reviewed.       Assessment and plan     Plan of care has been discussed with the patient, he agrees to the plan and verbalized understanding.  All his questions were answered  More than 50% of the time spent in this visit was counseling the patient about  illness and treatment options         1. Restless legs    - pramipexole (MIRAPEX) 0.125 mg tablet; Take 1 Tab by mouth nightly.  Dispense: 30 Tab; Refill: 1  - REFERRAL TO NEUROLOGY    2. Essential hypertension  Blood pressures well controlled on current medications    3. Migraine without aura and without status migrainosus, not intractable  Patient is taking Imitrex and Fioricet they do relieve his headache but is coming more frequently,Patient had a CT scan done last year it was normal    4. NAFLD (nonalcoholic fatty liver disease)  Stable patient following with gastroenterology    5. Controlled type 2 diabetes mellitus without complication, without long-term current use of insulin ([place])  Patient is on regular insulin with a sliding well-controlled diabetes  - Insulin Needles, Disposable, (BD ULTRA-FINE SHORT PEN NEEDLE) 31 gauge x 5/16" ndle; Use 3 times daily as per sliding scale  Dispense: 1 Package; Refill: 11    6. Anxiety  Patient agrees on trying to decrease her Ativan dose and frequency  - LORazepam (ATIVAN) 1 mg tablet; Take 1 Tab by mouth every eight (8) hours as needed for Anxiety for up to 90 days. Max Daily Amount: 3 mg.  Dispense: 90 Tab; Refill: 0    Current Outpatient Medications   Medication Sig Dispense Refill    ??? pramipexole (MIRAPEX) 0.125 mg tablet Take 1 Tab by mouth nightly. 30 Tab 1   ??? LORazepam (ATIVAN) 1 mg tablet Take 1 Tab by mouth every eight (8)  hours as needed for Anxiety for up to 90 days. Max Daily Amount: 3 mg. 90 Tab 0   ??? Insulin Needles, Disposable, (BD ULTRA-FINE SHORT PEN NEEDLE) 31 gauge x 5/16" ndle Use 3 times daily as per sliding scale 1 Package 11   ??? sucralfate (CARAFATE) 1 gram tablet TAKE 1 TABLET BY MOUTH FOUR TIMES DAILY WITH MEALS AND AT BEDTIME 120 Tab 0   ??? SUMAtriptan (IMITREX) 100 mg tablet TAKE 1 TABLET BY MOUTH ONCE AS NEEDED FOR MIGRAINE FOR UP TO 1 DOSE 10 Tab 0   ??? topiramate (TOPAMAX) 100 mg tablet TAKE 1 TABLET BY MOUTH DAILY 90 Tab 0   ??? citalopram (CELEXA) 40 mg tablet TAKE 1 TABLET BY MOUTH DAILY 90 Tab 0   ??? finasteride (PROSCAR) 5 mg tablet TAKE 1 TABLET BY MOUTH DAILY 30 Tab 0   ??? GENERLAC 10 gram/15 mL solution TAKE 30CC BY MOUTH TWICE DAILY 1892 mL 0   ??? propranolol (INDERAL) 20 mg tablet TAKE 1 TABLET BY MOUTH THREE TIMES DAILY 90 Tab 0   ??? metFORMIN (GLUCOPHAGE) 500 mg tablet TAKE 2 TABLETS BY MOUTH TWICE DAILY WITH MEALS(GENERIC FOR GLUCOPHAGE) 360 Tab 0   ??? oxyCODONE IR (ROXICODONE) 10 mg tab immediate release tablet Take 10 mg by mouth every six (6) hours as needed for Pain.     ??? rosuvastatin (CRESTOR) 10 mg tablet TAKE 1 TABLET BY MOUTH NIGHTLY 90 Tab 0   ??? butalbital-acetaminophen-caffeine (FIORICET, ESGIC) 50-325-40 mg per tablet Take 1 Tab by mouth every six (6) hours as needed for Pain. 20 Tab 1   ??? HUMULIN R REGULAR U-100 INSULN 100 unit/mL injection As needed per sliding scale 1 Vial 6   ??? Insulin Syringe-Needle U-100 1 mL 29 gauge x 1/2" syrg Three times per day and as needed as per sliding scale 100 Syringe 6   ??? prochlorperazine (COMPAZINE) 10 mg tablet Take 0.5 Tabs by mouth every six (6) hours as needed. 30 Tab 5   ??? gemfibrozil (LOPID) 600 mg tablet TAKE 1 TABLET BY MOUTH TWICE DAILY 180 Tab 3    ??? ketoconazole (NIZORAL) 2 % topical cream Apply  to affected area daily. 15 g 0   ??? metaxalone (SKELAXIN) 800 mg tablet Take 800 mg by mouth three (3) times daily as needed.     ??? augmented betamethasone dipropionate (DIPROLENE-AF) 0.05 % ointment Apply  to affected area two (2) times a day.     ??? naloxone (NARCAN) 4 mg/actuation nasal spray Use 1 spray intranasally into 1 nostril. Use a new Narcan nasal spray for subsequent doses and administer into alternating nostrils. May repeat every 2 to 3 minutes as needed for opioid overdose symptoms. 1 Each 0   ??? aspirin 81 mg chewable tablet Take 81 mg by mouth daily.     ??? cholecalciferol, vitamin D3, (VITAMIN D3) 2,000 unit tab Take  by mouth.     ??? B.infantis-B.ani-B.long-B.bifi (PROBIOTIC 4X) 10-15 mg TbEC Take  by mouth.     ??? cyclobenzaprine (FLEXERIL) 10 mg tablet Take  by mouth three (3) times daily as needed for Muscle Spasm(s).       Facility-Administered Medications Ordered in Other Visits   Medication Dose Route Frequency Provider Last Rate Last Dose   ??? [START ON 10/04/2017] iron sucrose (VENOFER) 250 mg in 0.9% sodium chloride 250 mL IVPB  250 mg IntraVENous ONCE Suzy Bouchard, MD           Patient Active Problem List  Diagnosis Date Noted   ??? Migraine without aura and without status migrainosus, not intractable 04/15/2017   ??? 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 Dermatitis 01/05/2017   ??? Benign prostatic hyperplasia (BPH) with straining on urination 12/31/2016   ??? 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 Myelodysplastic syndrome ([place]) 12/22/2016   ??? Type 2 diabetes with nephropathy ([place]) 10/18/2016   ??? Iron deficiency anemia secondary to inadequate dietary iron intake 09/23/2016   ??? Chronic leukopenia 09/23/2016   ??? Thrombocytopenia ([place]) 09/23/2016   ??? 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 Type II diabetes mellitus ([place]) 08/03/2016   ??? HTN (hypertension) 08/03/2016   ??? Mediterranean fever 08/03/2016    ??? Stroke ([place]) 08/03/2016   ??? Compressed vertebrae ([place]) 08/03/2016   ??? Chronic back pain greater than 3 months duration 08/03/2016   ??? Hyperlipemia 08/03/2016   ??? Anxiety 08/03/2016   ??? Depression 06/30/2016   ??? Iron deficiency anemia 04/22/2016   ??? NAFLD (nonalcoholic fatty liver disease) 04/22/2016   ??? Other cirrhosis of liver ([place]) 04/22/2016   ??? Chronic anemia 03/18/2016   ??? Controlled type 2 diabetes mellitus without complication, without long-term current use of insulin ([place]) 03/18/2016   ??? Essential hypertension 03/18/2016   ??? Hyperlipidemia 03/18/2016   ??? History of stroke 03/18/2016   ??? Anxiety 03/18/2016   ??? Chronic pain 03/18/2016     Results for orders placed or performed during the hospital encounter of 09/20/17   CBC WITH 3 PART DIFF   Result Value Ref Range    WBC 3.4 (L) 4.5 - 13.0 K/uL    RBC 3.86 (L) 4.10 - 5.10 M/uL    HGB 9.8 (L) 12.0 - 16.0 g/dL    HCT 33.1 (L) 36 - 48 %    MCV 85.8 78 - 102 FL    MCH 25.4 25.0 - 35.0 PG    MCHC 29.6 (L) 31 - 37 g/dL    RDW 17.3 (H) 11.5 - 14.5 %    PLATELET 97 (L) 140 - 440 K/uL    NEUTROPHILS 76 (H) 40 - 70 %    MIXED CELLS 12 0.1 - 17 %    LYMPHOCYTES 12 (L) 14 - 44 %    ABS. NEUTROPHILS 2.6 1.8 - 9.5 K/UL    ABS. MIXED CELLS 0.4 0.0 - 2.3 K/uL    ABS. LYMPHOCYTES 0.4 (L) 1.1 - 5.9 K/UL    [facility] AUTOMATED       Hospital Outpatient Visit on 09/20/2017   Component Date Value Ref Range Status   ??? WBC 09/20/2017 3.4* 4.5 - 13.0 K/uL Final   ??? RBC 09/20/2017 3.86* 4.10 - 5.10 M/uL Final   ??? HGB 09/20/2017 9.8* 12.0 - 16.0 g/dL Final   ??? HCT 09/20/2017 33.1* 36 - 48 % Final   ??? MCV 09/20/2017 85.8  78 - 102 FL Final   ??? MCH 09/20/2017 25.4  25.0 - 35.0 PG Final   ??? MCHC 09/20/2017 29.6* 31 - 37 g/dL Final   ??? RDW 09/20/2017 17.3* 11.5 - 14.5 % Final   ??? PLATELET 09/20/2017 97* 140 - 440 K/uL Final   ??? NEUTROPHILS 09/20/2017 76* 40 - 70 % Final   ??? MIXED CELLS 09/20/2017 12  0.1 - 17 % Final   ??? LYMPHOCYTES 09/20/2017 12* 14 - 44 % Final    ??? ABS. NEUTROPHILS 09/20/2017 2.6  1.8 - 9.5 K/UL Final   ??? ABS. MIXED CELLS 09/20/2017  0.4  0.0 - 2.3 K/uL Final   ??? ABS. LYMPHOCYTES 09/20/2017 0.4* 1.1 - 5.9 K/UL Final    Test performed at [place] or Outpatient Infusion Center Location. Reviewed by Medical Director.   ??? DF 09/20/2017 AUTOMATED    Final   Hospital Outpatient Visit on 09/06/2017   Component Date Value Ref Range Status   ??? WBC 09/06/2017 3.4* 4.5 - 13.0 K/uL Final   ??? RBC 09/06/2017 3.74* 4.10 - 5.10 M/uL Final   ??? HGB 09/06/2017 9.1* 12.0 - 16.0 g/dL Final   ??? HCT 09/06/2017 31.4* 36 - 48 % Final   ??? MCV 09/06/2017 84.0  78 - 102 FL Final   ??? MCH 09/06/2017 24.3* 25.0 - 35.0 PG Final   ??? MCHC 09/06/2017 29.0* 31 - 37 g/dL Final   ??? RDW 09/06/2017 15.6* 11.5 - 14.5 % Final   ??? PLATELET 09/06/2017 88* 140 - 440 K/uL Final   ??? NEUTROPHILS 09/06/2017 76* 40 - 70 % Final   ??? MIXED CELLS 09/06/2017 12  0.1 - 17 % Final   ??? LYMPHOCYTES 09/06/2017 13* 14 - 44 % Final   ??? ABS. NEUTROPHILS 09/06/2017 2.6  1.8 - 9.5 K/UL Final   ??? ABS. MIXED CELLS 09/06/2017 0.4  0.0 - 2.3 K/uL Final   ??? ABS. LYMPHOCYTES 09/06/2017 0.4* 1.1 - 5.9 K/UL Final    Test performed at [place] or Outpatient Infusion Center Location. Reviewed by Medical Director.   ??? DF 09/06/2017 AUTOMATED    Final   Hospital Outpatient Visit on 08/31/2017   Component Date Value Ref Range Status   ??? Iron 08/31/2017 35* 50 - 175 ug/dL Final    Patients receiving metal-binding drugs (e.g. deferoxamine) may show spuriously depressed iron values, as chelated iron may not properly react in the iron assay.   ??? TIBC 08/31/2017 508* 250 - 450 ug/dL Final   ??? Iron % saturation 08/31/2017 7  % Final   ??? Sodium 08/31/2017 137  136 - 145 mmol/L Final   ??? Potassium 08/31/2017 5.2  3.5 - 5.5 mmol/L Final   ??? Chloride 08/31/2017 106  100 - 108 mmol/L Final   ??? CO2 08/31/2017 22  21 - 32 mmol/L Final    ??? Anion gap 08/31/2017 9  3.0 - 18 mmol/L Final   ??? Glucose 08/31/2017 108* 74 - 99 mg/dL Final   ??? BUN 08/31/2017 21* 7.0 - 18 MG/DL Final   ??? Creatinine 08/31/2017 1.10  0.6 - 1.3 MG/DL Final   ??? BUN/Creatinine ratio 08/31/2017 19  12 - 20   Final   ??? GFR est AA 08/31/2017 >60  >60 ml/min/1.74m Final   ??? GFR est non-AA 08/31/2017 >60  >60 ml/min/1.777mFinal    Comment: (NOTE)  Estimated GFR is calculated using the Modification of Diet in Renal   Disease (MDRD) Study equation, reported for both African Americans   (GFRAA) and non-African Americans (GFRNA), and normalized to 1.7341m body surface area. The physician must decide which value applies to   the patient. The MDRD study equation should only be used in   individuals age 49 86 older. It has not been validated for the   following: pregnant women, patients with serious comorbid conditions,   or on certain medications, or persons with extremes of body size,   muscle mass, or nutritional status.     ??? Calcium 08/31/2017 9.5  8.5 -  10.1 MG/DL Final   ??? Bilirubin, total 08/31/2017 0.4  0.2 - 1.0 MG/DL Final   ??? ALT (SGPT) 08/31/2017 27  16 - 61 U/L Final   ??? AST (SGOT) 08/31/2017 22  15 - 37 U/L Final   ??? Alk. phosphatase 08/31/2017 178* 45 - 117 U/L Final   ??? Protein, total 08/31/2017 7.5  6.4 - 8.2 g/dL Final   ??? Albumin 08/31/2017 4.7  3.4 - 5.0 g/dL Final   ??? Globulin 08/31/2017 2.8  2.0 - 4.0 g/dL Final   ??? A-G Ratio 08/31/2017 1.7  0.8 - 1.7   Final   ??? Ferritin 08/31/2017 8  8 - 388 NG/ML Final   Hospital Outpatient Visit on 08/31/2017   Component Date Value Ref Range Status   ??? WBC 08/31/2017 3.3* 4.5 - 13.0 K/uL Final   ??? RBC 08/31/2017 3.70* 4.10 - 5.10 M/uL Final   ??? HGB 08/31/2017 9.4* 12.0 - 16.0 g/dL Final   ??? HCT 08/31/2017 31.4* 36 - 48 % Final   ??? MCV 08/31/2017 84.9  78 - 102 FL Final   ??? MCH 08/31/2017 25.4  25.0 - 35.0 PG Final   ??? MCHC 08/31/2017 29.9* 31 - 37 g/dL Final   ??? RDW 08/31/2017 15.4* 11.5 - 14.5 % Final    ??? PLATELET 08/31/2017 102* 140 - 440 K/uL Final   ??? NEUTROPHILS 08/31/2017 73* 40 - 70 % Final   ??? MIXED CELLS 08/31/2017 11  0.1 - 17 % Final   ??? LYMPHOCYTES 08/31/2017 16  14 - 44 % Final   ??? ABS. NEUTROPHILS 08/31/2017 2.4  1.8 - 9.5 K/UL Final   ??? ABS. MIXED CELLS 08/31/2017 0.4  0.0 - 2.3 K/uL Final   ??? ABS. LYMPHOCYTES 08/31/2017 0.5* 1.1 - 5.9 K/UL Final    Test performed at [place] or Outpatient Infusion Center Location. Reviewed by Medical Director.   ??? DF 08/31/2017 AUTOMATED    Final   Hospital Outpatient Visit on 08/03/2017   Component Date Value Ref Range Status   ??? WBC 08/03/2017 2.9* 4.5 - 13.0 K/uL Final   ??? RBC 08/03/2017 3.31* 4.10 - 5.10 M/uL Final   ??? HGB 08/03/2017 8.7* 12.0 - 16.0 g/dL Final   ??? HCT 08/03/2017 29.1* 36 - 48 % Final   ??? MCV 08/03/2017 87.9  78 - 102 FL Final   ??? MCH 08/03/2017 26.3  25.0 - 35.0 PG Final   ??? MCHC 08/03/2017 29.9* 31 - 37 g/dL Final   ??? RDW 08/03/2017 15.7* 11.5 - 14.5 % Final   ??? PLATELET 08/03/2017 82* 140 - 440 K/uL Final   ??? NEUTROPHILS 08/03/2017 74* 40 - 70 % Final   ??? MIXED CELLS 08/03/2017 13  0.1 - 17 % Final   ??? LYMPHOCYTES 08/03/2017 13* 14 - 44 % Final   ??? ABS. NEUTROPHILS 08/03/2017 2.1  1.8 - 9.5 K/UL Final   ??? ABS. MIXED CELLS 08/03/2017 0.4  0.0 - 2.3 K/uL Final   ??? ABS. LYMPHOCYTES 08/03/2017 0.4* 1.1 - 5.9 K/UL Final    Test performed at [place] Location. Results Reviewed by Medical Director.   ??? DF 08/03/2017 AUTOMATED    Final   Hospital Outpatient Visit on 07/20/2017   Component Date Value Ref Range Status   ??? WBC 07/20/2017 3.1* 4.5 - 13.0 K/uL Final   ??? RBC 07/20/2017 3.21* 4.10 - 5.10 M/uL Final   ??? HGB 07/20/2017 9.0* 12.0 -  16.0 g/dL Final   ??? HCT 07/20/2017 29.2* 36 - 48 % Final   ??? MCV 07/20/2017 91.0  78 - 102 FL Final   ??? MCH 07/20/2017 28.0  25.0 - 35.0 PG Final   ??? MCHC 07/20/2017 30.8* 31 - 37 g/dL Final   ??? RDW 07/20/2017 17.4* 11.5 - 14.5 % Final    ??? PLATELET 07/20/2017 86* 140 - 440 K/uL Final   ??? NEUTROPHILS 07/20/2017 74* 40 - 70 % Final   ??? MIXED CELLS 07/20/2017 11  0.1 - 17 % Final   ??? LYMPHOCYTES 07/20/2017 16  14 - 44 % Final   ??? ABS. NEUTROPHILS 07/20/2017 2.3  1.8 - 9.5 K/UL Final   ??? ABS. MIXED CELLS 07/20/2017 0.3  0.0 - 2.3 K/uL Final   ??? ABS. LYMPHOCYTES 07/20/2017 0.5* 1.1 - 5.9 K/UL Final    Test performed at [place] Location. Results Reviewed by Medical Director.   ??? DF 07/20/2017 AUTOMATED    Final   Hospital Outpatient Visit on 07/06/2017   Component Date Value Ref Range Status   ??? Sodium 07/06/2017 138  136 - 145 mmol/L Final   ??? Potassium 07/06/2017 4.9  3.5 - 5.5 mmol/L Final   ??? Chloride 07/06/2017 108  100 - 108 mmol/L Final   ??? CO2 07/06/2017 22  21 - 32 mmol/L Final   ??? Anion gap 07/06/2017 8  3.0 - 18 mmol/L Final   ??? Glucose 07/06/2017 115* 74 - 99 mg/dL Final   ??? BUN 07/06/2017 19* 7.0 - 18 MG/DL Final   ??? Creatinine 07/06/2017 1.06  0.6 - 1.3 MG/DL Final   ??? BUN/Creatinine ratio 07/06/2017 18  12 - 20   Final   ??? GFR est AA 07/06/2017 >60  >60 ml/min/1.3m Final   ??? GFR est non-AA 07/06/2017 >60  >60 ml/min/1.725mFinal    Comment: (NOTE)  Estimated GFR is calculated using the Modification of Diet in Renal   Disease (MDRD) Study equation, reported for both African Americans   (GFRAA) and non-African Americans (GFRNA), and normalized to 1.7363m body surface area. The physician must decide which value applies to   the patient. The MDRD study equation should only be used in   individuals age 48 19 older. It has not been validated for the   following: pregnant women, patients with serious comorbid conditions,   or on certain medications, or persons with extremes of body size,   muscle mass, or nutritional status.     ??? Calcium 07/06/2017 9.6  8.5 - 10.1 MG/DL Final   ??? Bilirubin, total 07/06/2017 0.5  0.2 - 1.0 MG/DL Final   ??? ALT (SGPT) 07/06/2017 25  16 - 61 U/L Final    ??? AST (SGOT) 07/06/2017 25  15 - 37 U/L Final   ??? Alk. phosphatase 07/06/2017 165* 45 - 117 U/L Final   ??? Protein, total 07/06/2017 7.8  6.4 - 8.2 g/dL Final   ??? Albumin 07/06/2017 4.6  3.4 - 5.0 g/dL Final   ??? Globulin 07/06/2017 3.2  2.0 - 4.0 g/dL Final   ??? A-G Ratio 07/06/2017 1.4  0.8 - 1.7   Final   ??? Iron 07/06/2017 48* 50 - 175 ug/dL Final    Patients receiving metal-binding drugs (e.g. deferoxamine) may show spuriously depressed iron values, as chelated iron may not properly react in the iron assay.   ??? TIBC 07/06/2017 445  250 - 450 ug/dL Final   ??? Iron %  saturation 07/06/2017 11  % Final   ??? Ferritin 07/06/2017 60  8 - 388 NG/ML Final   Hospital Outpatient Visit on 07/06/2017   Component Date Value Ref Range Status   ??? WBC 07/06/2017 2.8* 4.5 - 13.0 K/uL Final   ??? RBC 07/06/2017 3.73* 4.10 - 5.10 M/uL Final   ??? HGB 07/06/2017 10.3* 12.0 - 16.0 g/dL Final   ??? HCT 07/06/2017 33.7* 36 - 48 % Final   ??? MCV 07/06/2017 90.3  78 - 102 FL Final   ??? MCH 07/06/2017 27.6  25.0 - 35.0 PG Final   ??? MCHC 07/06/2017 30.6* 31 - 37 g/dL Final   ??? RDW 07/06/2017 20.0* 11.5 - 14.5 % Final   ??? PLATELET 07/06/2017 82* 140 - 440 K/uL Final   ??? NEUTROPHILS 07/06/2017 78* 40 - 70 % Final   ??? MIXED CELLS 07/06/2017 10  0.1 - 17 % Final   ??? LYMPHOCYTES 07/06/2017 12* 14 - 44 % Final   ??? ABS. NEUTROPHILS 07/06/2017 2.2  1.8 - 9.5 K/UL Final   ??? ABS. MIXED CELLS 07/06/2017 0.3  0.0 - 2.3 K/uL Final   ??? ABS. LYMPHOCYTES 07/06/2017 0.3* 1.1 - 5.9 K/UL Final    Test performed at [place] Location. Results Reviewed by Medical Director.   ??? DF 07/06/2017 AUTOMATED    Final          Follow-up and Dispositions    ?? Return in about 1 month (around 11/02/2017) for medicare  wellness .

## 2017-10-04 ENCOUNTER — Institutional Professional Consult (permissible substitution): Admit: 2017-10-04 | Discharge: 2017-10-04 | Payer: PRIVATE HEALTH INSURANCE | Primary: Legal Medicine

## 2017-10-04 ENCOUNTER — Inpatient Hospital Stay: Admit: 2017-10-04 | Payer: MEDICARE | Primary: Legal Medicine

## 2017-10-04 ENCOUNTER — Inpatient Hospital Stay: Admit: 2017-10-04 | Primary: Legal Medicine

## 2017-10-04 DIAGNOSIS — D649 Anemia, unspecified: Secondary | ICD-10-CM

## 2017-10-04 DIAGNOSIS — D508 Other iron deficiency anemias: Secondary | ICD-10-CM

## 2017-10-04 LAB — CBC WITH 3 PART DIFF
ABS. LYMPHOCYTES: 0.5 10*3/uL — ABNORMAL LOW (ref 1.1–5.9)
ABS. MIXED CELLS: 0.3 10*3/uL (ref 0.0–2.3)
ABS. NEUTROPHILS: 2.1 10*3/uL (ref 1.8–9.5)
HCT: 34.7 % — ABNORMAL LOW (ref 36–48)
HGB: 10.2 g/dL — ABNORMAL LOW (ref 12.0–16.0)
LYMPHOCYTES: 17 % (ref 14–44)
MCH: 25.8 PG (ref 25.0–35.0)
MCHC: 29.4 g/dL — ABNORMAL LOW (ref 31–37)
MCV: 87.6 FL (ref 78–102)
Mixed cells: 10 % (ref 0.1–17)
NEUTROPHILS: 74 % — ABNORMAL HIGH (ref 40–70)
PLATELET: 83 10*3/uL — ABNORMAL LOW (ref 140–440)
RBC: 3.96 M/uL — ABNORMAL LOW (ref 4.10–5.10)
RDW: 19.2 % — ABNORMAL HIGH (ref 11.5–14.5)
WBC: 2.9 10*3/uL — ABNORMAL LOW (ref 4.5–13.0)

## 2017-10-04 MED ORDER — SODIUM CHLORIDE 0.9 % IJ SYRG
INTRAMUSCULAR | Status: DC | PRN
Start: 2017-10-04 — End: 2017-10-08

## 2017-10-04 MED FILL — BD POSIFLUSH NORMAL SALINE 0.9 % INJECTION SYRINGE: INTRAMUSCULAR | Qty: 40

## 2017-10-04 NOTE — Progress Notes (Signed)
[facility] Progress Note    Date: October 04, 2017    Name: William [facility]    MRN: [id number]         DOB: [birth year]      [name] was assessed and education was provided.     [name] vitals were reviewed and patient was observed for 5 minutes prior to treatment.   Visit Vitals  BP 115/63 (BP 1 Location: Left arm, BP Patient Position: At rest;Sitting)   Pulse 71   Temp 97.7 ??F (36.5 ??C)   Resp 16   SpO2 100%       Lab results were obtained and reviewed.  Recent Results (from the past 12 hour(s))   CBC WITH 3 PART DIFF    Collection Time: 10/04/17  2:20 PM   Result Value Ref Range    WBC 2.9 (L) 4.5 - 13.0 K/uL    RBC 3.96 (L) 4.10 - 5.10 M/uL    HGB 10.2 (L) 12.0 - 16.0 g/dL    HCT 34.7 (L) 36 - 48 %    MCV 87.6 78 - 102 FL    MCH 25.8 25.0 - 35.0 PG    MCHC 29.4 (L) 31 - 37 g/dL    RDW 19.2 (H) 11.5 - 14.5 %    PLATELET 83 (L) 140 - 440 K/uL    NEUTROPHILS 74 (H) 40 - 70 %    MIXED CELLS 10 0.1 - 17 %    LYMPHOCYTES 17 14 - 44 %    ABS. NEUTROPHILS 2.1 1.8 - 9.5 K/UL    ABS. MIXED CELLS 0.3 0.0 - 2.3 K/uL    ABS. LYMPHOCYTES 0.5 (L) 1.1 - 5.9 K/UL    DF AUTOMATED         Procrit not given for H&H 10.2/34.7.    Dose #3 of 4 Venofer 250 mg IV given via peripheral line after brisk blood return obtained.    [name] tolerated the infusion, and had no complaints.  Patient armband removed and shredded.    Mr William Blanchard politey declined staying for observation after infusion.    [name] was discharged from [place] in stable condition at 1600. He is to return on 10/11/17 at 1100 for his next appointment for CBC/Procrit and Venofer dose #4 of 4 Q 2 wks.    Lynnda Shields, RN  October 04, 2017

## 2017-10-06 MED ORDER — GENERLAC 10 GRAM/15 ML ORAL SOLUTION
10 gram/15 mL | ORAL | 0 refills | Status: DC
Start: 2017-10-06 — End: 2017-11-16

## 2017-10-12 ENCOUNTER — Telehealth

## 2017-10-12 MED ORDER — IRON SUCROSE 100 MG/5 ML IV SOLN
100 mg iron/5 mL | Freq: Once | INTRAVENOUS | Status: AC
Start: 2017-10-12 — End: 2017-10-19
  Administered 2017-10-19: 15:00:00 via INTRAVENOUS

## 2017-10-12 MED FILL — VENOFER 100 MG IRON/5 ML INTRAVENOUS SOLUTION: 100 mg iron/5 mL | INTRAVENOUS | Qty: 12.5

## 2017-10-12 NOTE — Telephone Encounter (Signed)
Wife called stating pt needs smallest needle w/ syringe. He also needs refill of lancets & test strips. His machine is a One Touch Ultra 2.

## 2017-10-17 ENCOUNTER — Encounter

## 2017-10-18 MED ORDER — BLOOD SUGAR DIAGNOSTIC TEST STRIPS
ORAL_STRIP | 3 refills | Status: DC
Start: 2017-10-18 — End: 2019-04-24

## 2017-10-18 MED ORDER — INSULIN NEEDLES (DISPOSABLE) 31 X 5/16"
31 gauge x 5/16" | PACK | 11 refills | Status: DC
Start: 2017-10-18 — End: 2018-08-22

## 2017-10-18 MED ORDER — LANCETS
11 refills | Status: DC
Start: 2017-10-18 — End: 2018-10-25

## 2017-10-19 ENCOUNTER — Institutional Professional Consult (permissible substitution): Admit: 2017-10-19 | Discharge: 2017-10-19 | Payer: PRIVATE HEALTH INSURANCE | Primary: Legal Medicine

## 2017-10-19 ENCOUNTER — Inpatient Hospital Stay: Admit: 2017-10-19 | Primary: Legal Medicine

## 2017-10-19 ENCOUNTER — Inpatient Hospital Stay: Admit: 2017-10-19 | Payer: MEDICARE | Primary: Legal Medicine

## 2017-10-19 DIAGNOSIS — D649 Anemia, unspecified: Secondary | ICD-10-CM

## 2017-10-19 LAB — CBC WITH 3 PART DIFF
ABS. LYMPHOCYTES: 0.6 10*3/uL — ABNORMAL LOW (ref 1.1–5.9)
ABS. MIXED CELLS: 0.2 10*3/uL (ref 0.0–2.3)
ABS. NEUTROPHILS: 2.4 10*3/uL (ref 1.8–9.5)
HCT: 36.3 % (ref 36–48)
HGB: 11.4 g/dL — ABNORMAL LOW (ref 12.0–16.0)
LYMPHOCYTES: 18 % (ref 14–44)
MCH: 27.7 PG (ref 25.0–35.0)
MCHC: 31.4 g/dL (ref 31–37)
MCV: 88.3 FL (ref 78–102)
Mixed cells: 5 % (ref 0.1–17)
NEUTROPHILS: 76 % — ABNORMAL HIGH (ref 40–70)
PLATELET: 78 10*3/uL — ABNORMAL LOW (ref 140–440)
RBC: 4.11 M/uL (ref 4.10–5.10)
RDW: 20.1 % — ABNORMAL HIGH (ref 11.5–14.5)
WBC: 3.2 10*3/uL — ABNORMAL LOW (ref 4.5–13.0)

## 2017-10-19 MED ORDER — SODIUM CHLORIDE 0.9 % IJ SYRG
INTRAMUSCULAR | Status: DC | PRN
Start: 2017-10-19 — End: 2017-10-23
  Administered 2017-10-19: 15:00:00 via INTRAVENOUS

## 2017-10-19 MED ORDER — PROPRANOLOL 20 MG TAB
20 mg | ORAL_TABLET | ORAL | 0 refills | Status: DC
Start: 2017-10-19 — End: 2017-11-29

## 2017-10-19 MED ORDER — FINASTERIDE 5 MG TAB
5 mg | ORAL_TABLET | ORAL | 0 refills | Status: DC
Start: 2017-10-19 — End: 2017-11-18

## 2017-10-19 MED FILL — BD POSIFLUSH NORMAL SALINE 0.9 % INJECTION SYRINGE: INTRAMUSCULAR | Qty: 40

## 2017-10-19 NOTE — Progress Notes (Signed)
[facility] Progress Note    Date: October 19, 2017    Name: William Blanchard [facility]    MRN: [id number]         DOB: 11-23-1945      [name] was assessed and education was provided.     [name] vitals were reviewed and patient was observed for 5 minutes prior to treatment.   Visit Vitals  BP 102/66 (BP 1 Location: Left arm, BP Patient Position: At rest;Sitting)   Pulse 73   Temp 97.5 ??F (36.4 ??C)   Resp 16       Lab results were obtained and reviewed.  Recent Results (from the past 12 hour(s))   CBC WITH 3 PART DIFF    Collection Time: 10/19/17 11:09 AM   Result Value Ref Range    WBC 3.2 (L) 4.5 - 13.0 K/uL    RBC 4.11 4.10 - 5.10 M/uL    HGB 11.4 (L) 12.0 - 16.0 g/dL    HCT 36.3 36 - 48 %    MCV 88.3 78 - 102 FL    MCH 27.7 25.0 - 35.0 PG    MCHC 31.4 31 - 37 g/dL    RDW 20.1 (H) 11.5 - 14.5 %    PLATELET 78 (L) 140 - 440 K/uL    NEUTROPHILS 76 (H) 40 - 70 %    MIXED CELLS 5 0.1 - 17 %    LYMPHOCYTES 18 14 - 44 %    ABS. NEUTROPHILS 2.4 1.8 - 9.5 K/UL    ABS. MIXED CELLS 0.2 0.0 - 2.3 K/uL    ABS. LYMPHOCYTES 0.6 (L) 1.1 - 5.9 K/UL    DF AUTOMATED         Procrit not given for H&H 11/02/2017.    Dose #4 of 4 Venofer 250 mg IV given via peripheral line after brisk blood return obtained.    [name] tolerated the infusion, and had no complaints.  Patient armband removed and shredded.    [name] declined staying for observation after infusion.    [name] was discharged from [place] in stable condition at 1255. He is to return on 11/02/17 at 1300 for his next appointment for CBC/Retacrit.    Faylene Million, RN  October 19, 2017

## 2017-10-26 ENCOUNTER — Encounter

## 2017-10-26 NOTE — Progress Notes (Signed)
William Blanchard called to cancel William Blanchard 11/02/17 appt due to conflict with a doctor's appt. We rescheduled to 11/09/17 at 1400.

## 2017-10-28 MED ORDER — SUMATRIPTAN 100 MG TAB
100 mg | ORAL_TABLET | ORAL | 0 refills | Status: DC
Start: 2017-10-28 — End: 2017-12-02

## 2017-10-28 MED ORDER — ROSUVASTATIN 10 MG TAB
10 mg | ORAL_TABLET | ORAL | 0 refills | Status: DC
Start: 2017-10-28 — End: 2018-01-23

## 2017-10-28 MED ORDER — SUCRALFATE 1 GRAM TAB
1 gram | ORAL_TABLET | ORAL | 0 refills | Status: DC
Start: 2017-10-28 — End: 2017-11-23

## 2017-11-02 ENCOUNTER — Encounter: Attending: Hematology & Oncology | Primary: Legal Medicine

## 2017-11-02 DIAGNOSIS — D508 Other iron deficiency anemias: Secondary | ICD-10-CM

## 2017-11-03 ENCOUNTER — Inpatient Hospital Stay: Payer: MEDICARE | Primary: Legal Medicine

## 2017-11-09 ENCOUNTER — Encounter: Attending: Hematology & Oncology | Primary: Legal Medicine

## 2017-11-09 ENCOUNTER — Encounter: Primary: Legal Medicine

## 2017-11-10 ENCOUNTER — Encounter

## 2017-11-10 ENCOUNTER — Inpatient Hospital Stay: Payer: MEDICARE | Primary: Legal Medicine

## 2017-11-10 NOTE — Progress Notes (Signed)
Ms Jewel called to reschedule yesterday's Retacrit appt to 11/17/17 when he has an office visit with Dr Nadyne Coombes.

## 2017-11-11 MED ORDER — METFORMIN 500 MG TAB
500 mg | ORAL_TABLET | ORAL | 0 refills | Status: DC
Start: 2017-11-11 — End: 2018-02-13

## 2017-11-16 ENCOUNTER — Encounter: Payer: MEDICARE | Primary: Legal Medicine

## 2017-11-17 ENCOUNTER — Encounter: Admit: 2017-11-17 | Discharge: 2017-11-17 | Payer: PRIVATE HEALTH INSURANCE | Primary: Legal Medicine

## 2017-11-17 ENCOUNTER — Inpatient Hospital Stay: Admit: 2017-11-17 | Payer: MEDICARE | Primary: Legal Medicine

## 2017-11-17 ENCOUNTER — Inpatient Hospital Stay: Admit: 2017-11-17 | Primary: Legal Medicine

## 2017-11-17 ENCOUNTER — Ambulatory Visit
Admit: 2017-11-17 | Discharge: 2017-11-17 | Payer: PRIVATE HEALTH INSURANCE | Attending: Hematology & Oncology | Primary: Legal Medicine

## 2017-11-17 DIAGNOSIS — D696 Thrombocytopenia, unspecified: Secondary | ICD-10-CM

## 2017-11-17 DIAGNOSIS — D469 Myelodysplastic syndrome, unspecified: Secondary | ICD-10-CM

## 2017-11-17 LAB — CBC WITH 3 PART DIFF
ABS. LYMPHOCYTES: 0.4 10*3/uL — ABNORMAL LOW (ref 1.1–5.9)
ABS. MIXED CELLS: 0.2 10*3/uL (ref 0.0–2.3)
ABS. NEUTROPHILS: 2.1 10*3/uL (ref 1.8–9.5)
HCT: 36.6 % (ref 36–48)
HGB: 11.6 g/dL — ABNORMAL LOW (ref 12.0–16.0)
LYMPHOCYTES: 15 % (ref 14–44)
MCH: 28.9 PG (ref 25.0–35.0)
MCHC: 31.7 g/dL (ref 31–37)
MCV: 91.3 FL (ref 78–102)
Mixed cells: 7 % (ref 0.1–17)
NEUTROPHILS: 78 % — ABNORMAL HIGH (ref 40–70)
PLATELET: 83 10*3/uL — ABNORMAL LOW (ref 140–440)
RBC: 4.01 M/uL — ABNORMAL LOW (ref 4.10–5.10)
RDW: 18.6 % — ABNORMAL HIGH (ref 11.5–14.5)
WBC: 2.7 10*3/uL — ABNORMAL LOW (ref 4.5–13.0)

## 2017-11-17 MED ORDER — EPOETIN ALFA-EPBX 10,000 UNIT/ML INJECTION SOLUTION
10000 unit/mL | Freq: Once | INTRAMUSCULAR | Status: DC
Start: 2017-11-17 — End: 2017-11-18

## 2017-11-17 NOTE — Progress Notes (Signed)
[facility] Progress Note    Date: Nov 17, 2017    Name: William Blanchard [facility]    MRN: [id number]         DOB: [birth year]      [name] was assessed and education was provided.     [name] vitals were reviewed and patient was observed for 5 minutes prior to treatment.   Visit Vitals  BP 110/65 (BP 1 Location: Right arm, BP Patient Position: At rest;Sitting)   Pulse 69   Temp 97.8 ??F (36.6 ??C)   Resp 16   Wt 60.5 kg (133 lb 6.4 oz)   SpO2 100%   BMI 19.70 kg/m??     CBC x1 attempt to to right AC, tubes for office draw also.    Lab results were obtained and reviewed.  Recent Results (from the past 12 hour(s))   CBC WITH 3 PART DIFF    Collection Time: 11/17/17  1:14 PM   Result Value Ref Range    WBC 2.7 (L) 4.5 - 13.0 K/uL    RBC 4.01 (L) 4.10 - 5.10 M/uL    HGB 11.6 (L) 12.0 - 16.0 g/dL    HCT 36.6 36 - 48 %    MCV 91.3 78 - 102 FL    MCH 28.9 25.0 - 35.0 PG    MCHC 31.7 31 - 37 g/dL    RDW 18.6 (H) 11.5 - 14.5 %    PLATELET 83 (L) 140 - 440 K/uL    NEUTROPHILS 78 (H) 40 - 70 %    MIXED CELLS 7 0.1 - 17 %    LYMPHOCYTES 15 14 - 44 %    ABS. NEUTROPHILS 2.1 1.8 - 9.5 K/UL    ABS. MIXED CELLS 0.2 0.0 - 2.3 K/uL    ABS. LYMPHOCYTES 0.4 (L) 1.1 - 5.9 K/UL    DF AUTOMATED         Retacrit held per order parameter.      [name] tolerated the infusion, and had no complaints.  Patient armband removed and shredded.    [name] politey declined staying for observation after infusion.    [name] was discharged from [place] in stable condition at 1330. He is to return on 11/29/17 at 1300 for his next appointment for CBC/Retacrit.    Denyse Amass, RN  Nov 17, 2017   1352

## 2017-11-17 NOTE — Patient Instructions (Signed)
Myelodysplastic Syndromes: Care Instructions  Your Care Instructions  Myelodysplastic syndromes, also called MDS, are a group of rare conditions in which the bone marrow does not make enough healthy blood cells. Normally, the bone marrow makes red blood cells, white blood cells, and platelets. These cells carry oxygen in the blood, help the body fight infections, and help the blood clot. With MDS, you may feel weak and tired, get infections often, and bruise easily, although symptoms tend to vary.  MDS is a form of blood cancer. In some cases, MDS can turn into acute myeloid leukemia (AML), another type of cancer. Some people develop MDS after treatment for cancer or exposure to pesticides or other chemicals. But in most cases, the cause of MDS is not known.  Your doctor will use the results of blood tests to guide your treatment. There are many types of MDS, with different treatment plans for each. If you have enough red blood cells and are feeling all right, you may not need active treatment, but you and your doctor will want to watch your condition carefully. If you start feeling lightheaded and have no energy, you may need a blood transfusion. Your doctor also may give you antibiotics to prevent or treat infection.  Follow-up care is a key part of your treatment and safety. Be sure to make and go to all appointments, and call your doctor if you are having problems. It's also a good idea to know your test results and keep a list of the medicines you take.  How can you care for yourself at home?  ?? Take your medicines exactly as prescribed. Call your doctor if you think you are having a problem with your medicine. You will get more details on the specific medicines your doctor prescribes.  ?? If your doctor prescribed antibiotics, take them as directed. Do not stop taking them just because you feel better. You need to take the full course of antibiotics. If you have side effects from antibiotics, tell  your doctor.  ?? Take steps to control your stress and workload. Learn relaxation techniques.  ? Share your feelings. Stress and tension affect our emotions. By expressing your feelings to others, you may be able to understand and cope with them.  ? Consider joining a support group. Talking about a problem with your spouse, a good friend, or other people with similar problems is a good way to reduce tension and stress.  ? Express yourself with art. Try writing, crafts, dance, or art to relieve stress.  ? Be kind to your body and mind. Getting enough sleep, eating a healthy diet, and taking time to do things you enjoy can contribute to an overall feeling of balance in your life and help reduce stress.  ? Get help if you need it. Discuss your concerns with your doctor or counselor.  ?? Do not smoke. Smoking can make blood problems worse. If you need help quitting, talk to your doctor about stop-smoking programs and medicines. These can increase your chances of quitting for good.  ?? If you have not already done so, prepare a list of advance directives. Advance directives are instructions to your doctor and family members about what kind of care you want if you become unable to speak or express yourself.  ?? Call the [facility] [phone]) or visit its website at [email or web address] for more information.  When should you call for help?  Call 911 anytime you think you may need emergency care. For example,  call if:  ?? ?? You passed out (lost consciousness).   ??Call your doctor now or seek immediate medical care if:  ?? ?? You have a fever.   ?? ?? You have abnormal bleeding.   ?? ?? You have new or worse pain.   ?? ?? You think you have an infection.   ?? ?? You have new symptoms, such as a cough, belly pain, vomiting, diarrhea, or a rash.   ??Watch closely for changes in your health, and be sure to contact your doctor if:  ?? ?? You are much more tired than usual.    ?? ?? You have swollen glands in your armpits, groin, or neck.   ?? ?? You do not get better as expected.   Where can you learn more?  Go to http://www.healthwise.net/[employer].  Enter U281 in the search box to learn more about "Myelodysplastic Syndromes: Care Instructions."  Current as of: September 21, 2016  Content Version: 11.9  ?? 2006-2018 [employer]. Care instructions adapted under license by [employer] (which disclaims liability or warranty for this information). If you have questions about a medical condition or this instruction, always ask your healthcare professional. [employer] disclaims any warranty or liability for your use of this information.

## 2017-11-17 NOTE — Progress Notes (Signed)
Hematology/Oncology  Progress Note    Name: William Jirak Sr.  Date: 11/17/2017  DOB: 1946-06-04    PCP: [name], MD     [name] is a 72 y.o.-year-old man who has myelodysplastic syndrome, chronic leukopenia, and severe anemia.  Current therapy: Procrit 60,000 units every 2 weeks when the hemoglobin is below 10 g/dL.    Subjective:     [name] is a 72 year old man who has myelodysplastic syndrome with severe anemia.  He also has chronic leukopenia and thrombocytopenia.  He is currently receiving Procrit 60,000 units every 2 weeks. His wife reports he is eating well and sleeping better. He denies tiredness, weakness, and fatigue. He denies shortness of breath, chest pain, and dizziness. He does not have any concerns or complaints to report at this time.  Since his last clinic visit he received 4 doses of intravenous Venofer after he was found to have a ferritin level of 8 ng/mL.  The iron therapy was tolerated well.      Past medical history, family history, and social history: these were reviewed and remains unchanged.    Past Medical History:   Diagnosis Date   ??? Anemia    ??? Anxiety    ??? Chronic pain    ??? Cirrhosis of liver ([place])    ??? Diabetes ([place])    ??? GERD (gastroesophageal reflux disease)    ??? Hyperlipemia    ??? Hypertension    ??? Hypotension    ??? Migraine    ??? Other cirrhosis of liver ([place])    ??? Stroke [facility])     had 3 strokes    ??? Thrombocytopenia ([place])      Past Surgical History:   Procedure Laterality Date   ??? HX CHOLECYSTECTOMY     ??? HX ORTHOPAEDIC      R hand sx   ??? HX OTHER SURGICAL      Hand surgery- Nail gun went through finger     Social History     Socioeconomic History   ??? Marital status: MARRIED     Spouse name: Not on file   ??? Number of children: Not on file   ??? Years of education: Not on file   ??? Highest education level: Not on file   Occupational History   ??? Not on file   Social Needs   ??? Financial resource strain: Not on file   ??? Food insecurity:     Worry: Not on file      Inability: Not on file   ??? Transportation needs:     Medical: Not on file     Non-medical: Not on file   Tobacco Use   ??? Smoking status: Former Smoker   ??? Smokeless tobacco: Never Used   ??? Tobacco comment: quit years ago   Substance and Sexual Activity   ??? Alcohol use: No   ??? Drug use: No   ??? Sexual activity: Never     Partners: Male   Lifestyle   ??? Physical activity:     Days per week: Not on file     Minutes per session: Not on file   ??? Stress: Not on file   Relationships   ??? Social connections:     Talks on phone: Not on file     Gets together: Not on file     Attends religious service: Not on file     Active member of club or organization: Not on file  Attends meetings of clubs or organizations: Not on file     Relationship status: Not on file   ??? Intimate partner violence:     Fear of current or ex partner: Not on file     Emotionally abused: Not on file     Physically abused: Not on file     Forced sexual activity: Not on file   Other Topics Concern   ??? Not on file   Social History Narrative    ** Merged History Encounter **          Family History   Problem Relation Age of Onset   ??? Alzheimer Mother    ??? Diabetes Mother    ??? Cancer Mother         colon cancer    ??? Hypertension Father    ??? Heart Disease Father    ??? Cancer Father         prostate, lung cancer    ??? Diabetes Sister    ??? Heart Disease Sister    ??? Diabetes Brother      Current Outpatient Medications   Medication Sig Dispense Refill   ??? nortriptyline (PAMELOR) 10 mg capsule Take 30 mg by mouth nightly.     ??? metFORMIN (GLUCOPHAGE) 500 mg tablet TAKE 2 TABLETS BY MOUTH TWICE DAILY WITH MEALS(GENERIC FOR GLUCOPHAGE) 360 Tab 0   ??? SUMAtriptan (IMITREX) 100 mg tablet TAKE 1 TABLET BY MOUTH ONCE AS NEEDED FOR MIGRAINE FOR UP TO 1 DOSE 10 Tab 0   ??? sucralfate (CARAFATE) 1 gram tablet TAKE 1 TABLET BY MOUTH FOUR TIMES DAILY WITH MEALS AND AT BEDTIME 120 Tab 0   ??? rosuvastatin (CRESTOR) 10 mg tablet TAKE 1 TABLET BY MOUTH NIGHTLY 90 Tab 0    ??? propranolol (INDERAL) 20 mg tablet TAKE 1 TABLET BY MOUTH THREE TIMES DAILY 90 Tab 0   ??? finasteride (PROSCAR) 5 mg tablet TAKE 1 TABLET BY MOUTH DAILY 30 Tab 0   ??? lancets (ONETOUCH ULTRASOFT LANCETS) misc Use to test blood sugars four time daily. 1 Each 11   ??? glucose blood VI test strips (ASCENSIA AUTODISC VI, ONE TOUCH ULTRA TEST VI) strip Use to test blood sugars four times daily with Onetouch ultra 2. 400 Strip 3   ??? Insulin Needles, Disposable, (BD ULTRA-FINE SHORT PEN NEEDLE) 31 gauge x 5/16" ndle Use 4 times daily to inject insulin. 1 Package 11   ??? GENERLAC 10 gram/15 mL solution TAKE 30CC BY MOUTH TWICE DAILY 1892 mL 0   ??? pramipexole (MIRAPEX) 0.125 mg tablet Take 1 Tab by mouth nightly. 30 Tab 1   ??? LORazepam (ATIVAN) 1 mg tablet Take 1 Tab by mouth every eight (8) hours as needed for Anxiety for up to 90 days. Max Daily Amount: 3 mg. 90 Tab 0   ??? topiramate (TOPAMAX) 100 mg tablet TAKE 1 TABLET BY MOUTH DAILY 90 Tab 0   ??? citalopram (CELEXA) 40 mg tablet TAKE 1 TABLET BY MOUTH DAILY 90 Tab 0   ??? oxyCODONE IR (ROXICODONE) 10 mg tab immediate release tablet Take 10 mg by mouth every six (6) hours as needed for Pain.     ??? butalbital-acetaminophen-caffeine (FIORICET, ESGIC) 50-325-40 mg per tablet Take 1 Tab by mouth every six (6) hours as needed for Pain. 20 Tab 1   ??? HUMULIN R REGULAR U-100 INSULN 100 unit/mL injection As needed per sliding scale 1 Vial 6   ??? Insulin Syringe-Needle U-100 1 mL 29 gauge  x 1/2" syrg Three times per day and as needed as per sliding scale 100 Syringe 6   ??? prochlorperazine (COMPAZINE) 10 mg tablet Take 0.5 Tabs by mouth every six (6) hours as needed. 30 Tab 5   ??? gemfibrozil (LOPID) 600 mg tablet TAKE 1 TABLET BY MOUTH TWICE DAILY 180 Tab 3   ??? ketoconazole (NIZORAL) 2 % topical cream Apply  to affected area daily. 15 g 0   ??? metaxalone (SKELAXIN) 800 mg tablet Take 800 mg by mouth three (3) times daily as needed.      ??? augmented betamethasone dipropionate (DIPROLENE-AF) 0.05 % ointment Apply  to affected area two (2) times a day.     ??? naloxone (NARCAN) 4 mg/actuation nasal spray Use 1 spray intranasally into 1 nostril. Use a new Narcan nasal spray for subsequent doses and administer into alternating nostrils. May repeat every 2 to 3 minutes as needed for opioid overdose symptoms. 1 Each 0   ??? aspirin 81 mg chewable tablet Take 81 mg by mouth daily.     ??? cholecalciferol, vitamin D3, (VITAMIN D3) 2,000 unit tab Take  by mouth.     ??? B.infantis-B.ani-B.long-B.bifi (PROBIOTIC 4X) 10-15 mg TbEC Take  by mouth.     ??? cyclobenzaprine (FLEXERIL) 10 mg tablet Take  by mouth three (3) times daily as needed for Muscle Spasm(s).       Facility-Administered Medications Ordered in Other Visits   Medication Dose Route Frequency Provider Last Rate Last Dose   ??? epoetin alfa-epbx (RETACRIT) 60,000 Units combo injection  60,000 Units SubCUTAneous ONCE Suzy Bouchard, MD           Review of Systems  Constitutional: The patient has no acute distress or discomfort.  HEENT: The patient denies recent head trauma, eye pain, blurred vision,  hearing deficit, oropharyngeal mucosal pain or lesions, and the patient denies throat pain or discomfort.  Lymphatics: The patient denies palpable peripheral lymphadenopathy.  Hematologic: The patient denies having bruising, bleeding, or progressive fatigue.  Respiratory: Patient denies having shortness of breath, cough, sputum production, fever, or dyspnea on exertion.  Cardiovascular: The patient denies having leg pain, leg swelling, heart palpitations, chest permit, chest pain, or lightheadedness.  The patient denies having dyspnea on exertion.  Gastrointestinal: The patient denies having nausea, emesis, or diarrhea. The patient denies having any hematemesis or blood in the stool.  Genitourinary: Patient denies having urinary urgency, frequency, or dysuria.  The patient denies having blood in the urine.   Psychological: The patient denies having symptoms of nervousness, anxiety, depression, or thoughts of harming self.  Skin: Patient denies having skin rashes, skin, ulcerations, or unexplained itching or pruritus.  Musculoskeletal: The patient denies having pain in the joints or bones.      Objective:     There were no vitals taken for this visit.  ECOG PS=0  Physical Exam:   Gen. Appearance: The patient is in no acute distress.  Skin: There is no bruise or rash.  HEENT: The exam is unremarkable.  Neck: Supple without lymphadenopathy or thyromegaly.  Lungs: Clear to auscultation and percussion; there are no wheezes or rhonchi.  Heart: Regular rate and rhythm; there are no murmurs, gallops, or rubs.  Abdomen: Bowel sounds are present and normal.  There is no guarding, tenderness, or hepatosplenomegaly.  Extremities: There is no clubbing, cyanosis, or edema.  Neurologic: There are no focal neurologic deficits.  Lymphatics: There is no palpable peripheral lymphadenopathy. Musculoskeletal: The patient has full range of  motion at all joints.  There is no evidence of joint deformity or effusions.  There is no focal joint tenderness.  Psychological/psychiatric: There is no clinical evidence of anxiety, depression, or melancholy.    Lab data:      Results for orders placed or performed during the hospital encounter of 11/17/17   CBC WITH 3 PART DIFF     Status: Abnormal   Result Value Ref Range Status    WBC 2.7 (L) 4.5 - 13.0 K/uL Final    RBC 4.01 (L) 4.10 - 5.10 M/uL Final    HGB 11.6 (L) 12.0 - 16.0 g/dL Final    HCT 36.6 36 - 48 % Final    MCV 91.3 78 - 102 FL Final    MCH 28.9 25.0 - 35.0 PG Final    MCHC 31.7 31 - 37 g/dL Final    RDW 18.6 (H) 11.5 - 14.5 % Final    PLATELET 83 (L) 140 - 440 K/uL Final    NEUTROPHILS 78 (H) 40 - 70 % Final    MIXED CELLS 7 0.1 - 17 % Final    LYMPHOCYTES 15 14 - 44 % Final    ABS. NEUTROPHILS 2.1 1.8 - 9.5 K/UL Final    ABS. MIXED CELLS 0.2 0.0 - 2.3 K/uL Final     ABS. LYMPHOCYTES 0.4 (L) 1.1 - 5.9 K/UL Final     Comment: Test performed at [place] or [facility]fusion [facility] Location. Reviewed by Medical Director.    DF AUTOMATED   Final           Assessment:     1. Thrombocytopenia ([place])    2. Chronic leukopenia    3. Myelodysplastic syndrome ([place])    4. Iron deficiency anemia secondary to inadequate dietary iron intake      Plan:     Thrombocytopenia: The patient has a stable platelet count of 83,000. No therapeutic intervention is warranted.    Chronic leukopenia: The CBC from today shows that his WBC count is 2.7 with an absolute neutrophil count which is normal at 2.1. However the absolute lymphocyte count is low at 0.5. We will continue to monitor every 2 months. Therapeutic intervention is not warranted.      Myelodysplastic syndrome, refractory anemia due to myelodysplastic syndrome/iron deficiency anemia: I have explained to the patient that his hemoglobin today is 11.6 g/dL with hematocrit of 34.1 %.  This is a significant improvement after receiving intravenous iron for 4 doses in the form of Venofer.  I will check his iron profile and ferritin levels at this time along with comprehensive metabolic panel. If his ferritin level is below 25 we will need to give him intravenous iron to replenish his ferritin level.      Follow-up in 2 months or sooner if indicated.    Orders Placed This Encounter   ??? COMPLETE CBC & AUTO DIFF WBC   ??? InHouse CBC ([facility])     Standing Status:   Future     Number of Occurrences:   1     Standing Expiration Date:   03/11/7828   ??? METABOLIC PANEL, COMPREHENSIVE     Standing Status:   Future     Standing Expiration Date:   11/18/2018   ??? IRON PROFILE     Standing Status:   Future     Standing Expiration Date:   11/18/2018   ??? FERRITIN     Standing Status:  Future     Standing Expiration Date:   11/18/2018         Suzy Bouchard, MD  11/17/2017        I have assessed the patient independently and  agree with the full assessment as outlined.  Joycelyn Das, MD, FACP                                                                                        Hematology/Oncology  Progress Note    Name: William Schlink Sr.  Date: 11/17/2017  DOB: March 18, 1946    PCP: [name], MD     [name] is a 72 y.o.-year-old man who has myelodysplastic syndrome, chronic leukopenia, and severe anemia.  Current therapy: Procrit 60,000 units every 2 weeks when the hemoglobin is below 10 g/dL.    Subjective:     [name] is a 72 year old man who has myelodysplastic syndrome with severe anemia.  He also has chronic leukopenia and thrombocytopenia.  He is currently receiving Procrit 60,000 units every 2 weeks.  Today he is complaining of some fatigue and weakness.  His wife reports that although he is eating well he is continuing to lose weight and he is hypersomnolent, sleeping all of the time.    Past medical history, family history, and social history: these were reviewed and remains unchanged.    Past Medical History:   Diagnosis Date   ??? Anemia    ??? Anxiety    ??? Chronic pain    ??? Cirrhosis of liver ([place])    ??? Diabetes ([place])    ??? GERD (gastroesophageal reflux disease)    ??? Hyperlipemia    ??? Hypertension    ??? Hypotension    ??? Migraine    ??? Other cirrhosis of liver ([place])    ??? Stroke [facility])     had 3 strokes    ??? Thrombocytopenia ([place])      Past Surgical History:   Procedure Laterality Date   ??? HX CHOLECYSTECTOMY     ??? HX ORTHOPAEDIC      R hand sx   ??? HX OTHER SURGICAL      Hand surgery- Nail gun went through finger     Social History     Socioeconomic History   ??? Marital status: MARRIED     Spouse name: Not on file   ??? Number of children: Not on file   ??? Years of education: Not on file   ??? Highest education level: Not on file   Occupational History   ??? Not on file   Social Needs   ??? Financial resource strain: Not on file   ??? Food insecurity:     Worry: Not on file      Inability: Not on file   ??? Transportation needs:     Medical: Not on file     Non-medical: Not on file   Tobacco Use   ??? Smoking status: Former Smoker   ??? Smokeless tobacco: Never Used   ??? Tobacco comment: quit years ago   Substance and Sexual Activity   ??? Alcohol use: No   ???  Drug use: No   ??? Sexual activity: Never     Partners: Male   Lifestyle   ??? Physical activity:     Days per week: Not on file     Minutes per session: Not on file   ??? Stress: Not on file   Relationships   ??? Social connections:     Talks on phone: Not on file     Gets together: Not on file     Attends religious service: Not on file     Active member of club or organization: Not on file     Attends meetings of clubs or organizations: Not on file     Relationship status: Not on file   ??? Intimate partner violence:     Fear of current or ex partner: Not on file     Emotionally abused: Not on file     Physically abused: Not on file     Forced sexual activity: Not on file   Other Topics Concern   ??? Not on file   Social History Narrative    ** Merged History Encounter **          Family History   Problem Relation Age of Onset   ??? Alzheimer Mother    ??? Diabetes Mother    ??? Cancer Mother         colon cancer    ??? Hypertension Father    ??? Heart Disease Father    ??? Cancer Father         prostate, lung cancer    ??? Diabetes Sister    ??? Heart Disease Sister    ??? Diabetes Brother      Current Outpatient Medications   Medication Sig Dispense Refill   ??? nortriptyline (PAMELOR) 10 mg capsule Take 30 mg by mouth nightly.     ??? metFORMIN (GLUCOPHAGE) 500 mg tablet TAKE 2 TABLETS BY MOUTH TWICE DAILY WITH MEALS(GENERIC FOR GLUCOPHAGE) 360 Tab 0   ??? SUMAtriptan (IMITREX) 100 mg tablet TAKE 1 TABLET BY MOUTH ONCE AS NEEDED FOR MIGRAINE FOR UP TO 1 DOSE 10 Tab 0   ??? sucralfate (CARAFATE) 1 gram tablet TAKE 1 TABLET BY MOUTH FOUR TIMES DAILY WITH MEALS AND AT BEDTIME 120 Tab 0   ??? rosuvastatin (CRESTOR) 10 mg tablet TAKE 1 TABLET BY MOUTH NIGHTLY 90 Tab 0    ??? propranolol (INDERAL) 20 mg tablet TAKE 1 TABLET BY MOUTH THREE TIMES DAILY 90 Tab 0   ??? finasteride (PROSCAR) 5 mg tablet TAKE 1 TABLET BY MOUTH DAILY 30 Tab 0   ??? lancets (ONETOUCH ULTRASOFT LANCETS) misc Use to test blood sugars four time daily. 1 Each 11   ??? glucose blood VI test strips (ASCENSIA AUTODISC VI, ONE TOUCH ULTRA TEST VI) strip Use to test blood sugars four times daily with Onetouch ultra 2. 400 Strip 3   ??? Insulin Needles, Disposable, (BD ULTRA-FINE SHORT PEN NEEDLE) 31 gauge x 5/16" ndle Use 4 times daily to inject insulin. 1 Package 11   ??? GENERLAC 10 gram/15 mL solution TAKE 30CC BY MOUTH TWICE DAILY 1892 mL 0   ??? pramipexole (MIRAPEX) 0.125 mg tablet Take 1 Tab by mouth nightly. 30 Tab 1   ??? LORazepam (ATIVAN) 1 mg tablet Take 1 Tab by mouth every eight (8) hours as needed for Anxiety for up to 90 days. Max Daily Amount: 3 mg. 90 Tab 0   ??? topiramate (TOPAMAX) 100 mg tablet TAKE 1 TABLET BY  MOUTH DAILY 90 Tab 0   ??? citalopram (CELEXA) 40 mg tablet TAKE 1 TABLET BY MOUTH DAILY 90 Tab 0   ??? oxyCODONE IR (ROXICODONE) 10 mg tab immediate release tablet Take 10 mg by mouth every six (6) hours as needed for Pain.     ??? butalbital-acetaminophen-caffeine (FIORICET, ESGIC) 50-325-40 mg per tablet Take 1 Tab by mouth every six (6) hours as needed for Pain. 20 Tab 1   ??? HUMULIN R REGULAR U-100 INSULN 100 unit/mL injection As needed per sliding scale 1 Vial 6   ??? Insulin Syringe-Needle U-100 1 mL 29 gauge x 1/2" syrg Three times per day and as needed as per sliding scale 100 Syringe 6   ??? prochlorperazine (COMPAZINE) 10 mg tablet Take 0.5 Tabs by mouth every six (6) hours as needed. 30 Tab 5   ??? gemfibrozil (LOPID) 600 mg tablet TAKE 1 TABLET BY MOUTH TWICE DAILY 180 Tab 3   ??? ketoconazole (NIZORAL) 2 % topical cream Apply  to affected area daily. 15 g 0   ??? metaxalone (SKELAXIN) 800 mg tablet Take 800 mg by mouth three (3) times daily as needed.      ??? augmented betamethasone dipropionate (DIPROLENE-AF) 0.05 % ointment Apply  to affected area two (2) times a day.     ??? naloxone (NARCAN) 4 mg/actuation nasal spray Use 1 spray intranasally into 1 nostril. Use a new Narcan nasal spray for subsequent doses and administer into alternating nostrils. May repeat every 2 to 3 minutes as needed for opioid overdose symptoms. 1 Each 0   ??? aspirin 81 mg chewable tablet Take 81 mg by mouth daily.     ??? cholecalciferol, vitamin D3, (VITAMIN D3) 2,000 unit tab Take  by mouth.     ??? B.infantis-B.ani-B.long-B.bifi (PROBIOTIC 4X) 10-15 mg TbEC Take  by mouth.     ??? cyclobenzaprine (FLEXERIL) 10 mg tablet Take  by mouth three (3) times daily as needed for Muscle Spasm(s).       Facility-Administered Medications Ordered in Other Visits   Medication Dose Route Frequency Provider Last Rate Last Dose   ??? epoetin alfa-epbx (RETACRIT) 60,000 Units combo injection  60,000 Units SubCUTAneous ONCE Suzy Bouchard, MD           Review of Systems  Constitutional: The patient has no acute distress or discomfort.  HEENT: The patient denies recent head trauma, eye pain, blurred vision,  hearing deficit, oropharyngeal mucosal pain or lesions, and the patient denies throat pain or discomfort.  Lymphatics: The patient denies palpable peripheral lymphadenopathy.  Hematologic: The patient denies having bruising, bleeding, or progressive fatigue.  Respiratory: Patient denies having shortness of breath, cough, sputum production, fever, or dyspnea on exertion.  Cardiovascular: The patient denies having leg pain, leg swelling, heart palpitations, chest permit, chest pain, or lightheadedness.  The patient denies having dyspnea on exertion.  Gastrointestinal: The patient denies having nausea, emesis, or diarrhea. The patient denies having any hematemesis or blood in the stool.  Genitourinary: Patient denies having urinary urgency, frequency, or dysuria.  The patient denies having blood in the urine.   Psychological: The patient denies having symptoms of nervousness, anxiety, depression, or thoughts of harming self.  Skin: Patient denies having skin rashes, skin, ulcerations, or unexplained itching or pruritus.  Musculoskeletal: The patient denies having pain in the joints or bones.      Objective:     There were no vitals taken for this visit.  ECOG PS=0  Physical Exam:  Gen. Appearance: The patient is in no acute distress.  Skin: There is no bruise or rash.  HEENT: The exam is unremarkable.  Neck: Supple without lymphadenopathy or thyromegaly.  Lungs: Clear to auscultation and percussion; there are no wheezes or rhonchi.  Heart: Regular rate and rhythm; there are no murmurs, gallops, or rubs.  Abdomen: Bowel sounds are present and normal.  There is no guarding, tenderness, or hepatosplenomegaly.  Extremities: There is no clubbing, cyanosis, or edema.  Neurologic: There are no focal neurologic deficits.  Lymphatics: There is no palpable peripheral lymphadenopathy. Musculoskeletal: The patient has full range of motion at all joints.  There is no evidence of joint deformity or effusions.  There is no focal joint tenderness.  Psychological/psychiatric: There is no clinical evidence of anxiety, depression, or melancholy.    Lab data:      Results for orders placed or performed during the hospital encounter of 11/17/17   CBC WITH 3 PART DIFF     Status: Abnormal   Result Value Ref Range Status    WBC 2.7 (L) 4.5 - 13.0 K/uL Final    RBC 4.01 (L) 4.10 - 5.10 M/uL Final    HGB 11.6 (L) 12.0 - 16.0 g/dL Final    HCT 36.6 36 - 48 % Final    MCV 91.3 78 - 102 FL Final    MCH 28.9 25.0 - 35.0 PG Final    MCHC 31.7 31 - 37 g/dL Final    RDW 18.6 (H) 11.5 - 14.5 % Final    PLATELET 83 (L) 140 - 440 K/uL Final    NEUTROPHILS 78 (H) 40 - 70 % Final    MIXED CELLS 7 0.1 - 17 % Final    LYMPHOCYTES 15 14 - 44 % Final    ABS. NEUTROPHILS 2.1 1.8 - 9.5 K/UL Final    ABS. MIXED CELLS 0.2 0.0 - 2.3 K/uL Final     ABS. LYMPHOCYTES 0.4 (L) 1.1 - 5.9 K/UL Final     Comment: Test performed at [place] or Outpatient [facility]fusion [facility] Location. Reviewed by Medical Director.    DF AUTOMATED   Final           Assessment:     1. Thrombocytopenia ([place])    2. Chronic leukopenia    3. Myelodysplastic syndrome ([place])    4. Iron deficiency anemia secondary to inadequate dietary iron intake      Plan:   Myelodysplastic syndrome, refractory anemia due to myelodysplastic syndrome/iron deficiency anemia: Have explained to the patient that his hemoglobin today is 10.3 g/dL with hematocrit of 34 %.  He did receive Procrit at a dose of 60,000 units yesterday.  I will check his iron profile and ferritin levels at this time.  If his ferritin level is below 100 and we will need to give him intravenous iron to replenish his ferritin level.    Chronic leukopenia: The CBC from today shows that his WBC count is 3.3 with an absolute neutrophil count which is normal at 2.6.  However the absolute lymphocyte count is low at 0.5.  These will continue to be monitored.  Therapeutic intervention is not warranted.    Thrombocytopenia: The patient has a stable lytic count of 85,000.  No therapeutic intervention is warranted.    Follow-up in 2 months  Orders Placed This Encounter   ??? COMPLETE CBC & AUTO DIFF WBC   ??? InHouse CBC ([facility])     Standing  Status:   Future     Number of Occurrences:   1     Standing Expiration Date:   9/70/2637   ??? METABOLIC PANEL, COMPREHENSIVE     Standing Status:   Future     Standing Expiration Date:   11/18/2018   ??? IRON PROFILE     Standing Status:   Future     Standing Expiration Date:   11/18/2018   ??? FERRITIN     Standing Status:   Future     Standing Expiration Date:   11/18/2018       Suzy Bouchard, MD  11/17/2017      Please note: This document has been produced using voice recognition software.  Unrecognized errors in transcription may be present.

## 2017-11-18 ENCOUNTER — Encounter

## 2017-11-18 LAB — METABOLIC PANEL, COMPREHENSIVE
A-G Ratio: 1.4 (ref 0.8–1.7)
ALT (SGPT): 27 U/L (ref 16–61)
AST (SGOT): 33 U/L (ref 15–37)
Albumin: 4.6 g/dL (ref 3.4–5.0)
Alk. phosphatase: 174 U/L — ABNORMAL HIGH (ref 45–117)
Anion gap: 12 mmol/L (ref 3.0–18)
BUN/Creatinine ratio: 16 (ref 12–20)
BUN: 18 MG/DL (ref 7.0–18)
Bilirubin, total: 0.5 MG/DL (ref 0.2–1.0)
CO2: 19 mmol/L — ABNORMAL LOW (ref 21–32)
Calcium: 9.9 MG/DL (ref 8.5–10.1)
Chloride: 108 mmol/L (ref 100–108)
Creatinine: 1.11 MG/DL (ref 0.6–1.3)
GFR est AA: >60 mL/min/{1.73_m2} (ref >60)
GFR est non-AA: >60 mL/min/{1.73_m2} (ref >60)
Globulin: 3.2 g/dL (ref 2.0–4.0)
Glucose: 141 mg/dL — ABNORMAL HIGH (ref 74–99)
Potassium: 4.9 mmol/L (ref 3.5–5.5)
Protein, total: 7.8 g/dL (ref 6.4–8.2)
Sodium: 139 mmol/L (ref 136–145)

## 2017-11-18 LAB — IRON PROFILE
Iron % saturation: 14 %
Iron: 60 ug/dL (ref 50–175)
TIBC: 442 ug/dL (ref 250–450)

## 2017-11-18 LAB — FERRITIN: Ferritin: 51 NG/ML (ref 8–388)

## 2017-11-18 MED ORDER — FINASTERIDE 5 MG TAB
5 mg | ORAL_TABLET | ORAL | 0 refills | Status: DC
Start: 2017-11-18 — End: 2017-12-19

## 2017-11-18 MED ORDER — GENERLAC 10 GRAM/15 ML ORAL SOLUTION
10 gram/15 mL | ORAL | 0 refills | Status: DC
Start: 2017-11-18 — End: 2018-01-03

## 2017-11-18 NOTE — Telephone Encounter (Signed)
Pts wife stating Dr.Konikoff is doing dental work on the pt on Tuesday morning & Konikoffs office says they sent over paperwork for dr e to give the ok to perform oral sugery 3 times and have not got a response. They state they need a response back by 4pm today.

## 2017-11-18 NOTE — Telephone Encounter (Signed)
Letter faxed.

## 2017-11-18 NOTE — Telephone Encounter (Signed)
Pt calling to request medication refill of:  Requested Prescriptions     Pending Prescriptions Disp Refills   ??? GENERLAC 10 gram/15 mL solution [Pharmacy Med Name: GENERLAC 10GM/15ML ORAL/RECTAL SOL] 1892 mL 0     Sig: TAKE 30CC BY MOUTH TWICE DAILY          be sent to [id number] - [place], [place] - [address] AT [place]  Pt has about none remaining.     Pts last appt was 10/03/17  Advised pt of 72 hour time frame for refill requests. Please advise.

## 2017-11-23 ENCOUNTER — Encounter

## 2017-11-23 NOTE — Telephone Encounter (Signed)
Pt calling to request med refill of:  Requested Prescriptions     Pending Prescriptions Disp Refills   ??? LORazepam (ATIVAN) 1 mg tablet 90 Tab 0     Sig: Take 1 Tab by mouth every eight (8) hours as needed for Anxiety for up to 90 days. Max Daily Amount: 3 mg.     Be sent to [facility]    Pt has 3 tabs remaining     Pts last appt was 10/03/17     Pt advised of 72 hour time frame. Please advise.

## 2017-11-24 ENCOUNTER — Encounter

## 2017-11-24 MED ORDER — SUCRALFATE 1 GRAM TAB
1 gram | ORAL_TABLET | ORAL | 0 refills | Status: DC
Start: 2017-11-24 — End: 2017-12-26

## 2017-11-24 NOTE — Telephone Encounter (Signed)
This medication require office visit

## 2017-11-25 NOTE — Telephone Encounter (Signed)
Patient should be following up with neurology

## 2017-11-28 ENCOUNTER — Encounter

## 2017-11-29 ENCOUNTER — Encounter

## 2017-11-30 ENCOUNTER — Institutional Professional Consult (permissible substitution): Primary: Legal Medicine

## 2017-11-30 ENCOUNTER — Inpatient Hospital Stay: Admit: 2017-11-30 | Primary: Legal Medicine

## 2017-11-30 ENCOUNTER — Inpatient Hospital Stay: Payer: MEDICARE | Primary: Legal Medicine

## 2017-11-30 DIAGNOSIS — D508 Other iron deficiency anemias: Secondary | ICD-10-CM

## 2017-11-30 NOTE — Progress Notes (Signed)
[facility] Progress Note    Date: November 30, 2017    Name: Oma Alpert [facility] Sr.    MRN: [id number]         DOB: [birth year]      Mr. Decesare did NOT show up today, Wednesday, 11-30-17, for his [place] appointment for Q 2 Week CBC/Retacrit Injection. However, I called and spoke with his wife, Mrs. Grose, and she stated that Mr. Breighner wasn't feeling well today, and she asked to reschedule his appointment to a day next week. Therefore, she was made aware, that his appointment has been rescheduled to next Wednesday, 12-07-17 at 1600, and she agreed to the appointment.         [name], RN  November 30, 2017  3:37 PM

## 2017-12-01 ENCOUNTER — Inpatient Hospital Stay: Payer: MEDICARE | Primary: Legal Medicine

## 2017-12-01 MED ORDER — PROPRANOLOL 20 MG TAB
20 mg | ORAL_TABLET | ORAL | 0 refills | Status: DC
Start: 2017-12-01 — End: 2017-12-29

## 2017-12-01 MED ORDER — PRAMIPEXOLE 0.125 MG TAB
0.125 mg | ORAL_TABLET | ORAL | 0 refills | Status: DC
Start: 2017-12-01 — End: 2017-12-28

## 2017-12-02 ENCOUNTER — Ambulatory Visit
Admit: 2017-12-02 | Discharge: 2017-12-02 | Payer: PRIVATE HEALTH INSURANCE | Attending: Legal Medicine | Primary: Legal Medicine

## 2017-12-02 DIAGNOSIS — G2581 Restless legs syndrome: Secondary | ICD-10-CM

## 2017-12-02 MED ORDER — LORAZEPAM 1 MG TAB
1 mg | ORAL_TABLET | Freq: Three times a day (TID) | ORAL | 0 refills | Status: DC | PRN
Start: 2017-12-02 — End: 2018-02-21

## 2017-12-02 MED ORDER — SUMATRIPTAN 100 MG TAB
100 mg | ORAL_TABLET | Freq: Once | ORAL | 0 refills | Status: DC | PRN
Start: 2017-12-02 — End: 2017-12-04

## 2017-12-02 NOTE — Progress Notes (Signed)
William Blalock Townsend Sr.     Chief Complaint   Patient presents with   ??? Medication Refill     Vitals:    12/02/17 1037   BP: 116/69   Pulse: 68   Resp: 17   Temp: 97.9 ??F (36.6 ??C)   TempSrc: Oral   SpO2: 99%   Weight: 130 lb (59 kg)   Height: '5\' 9"'$  (1.753 m)   PainSc:   6         HPI: Patient is here for follow-up accompanied by his wife for  medication, refill  For ativan and sumatriptan ,both medication are risky for use ,ativan is not safe in elderly and anxiety need to be treated with effexor that can also help with migraine      I have spoken to DrBarot, Ikshvanku A,   And he agrees     Also sumatriptan need to be stopped it is not safe in patients with H/O stroke     Past Medical History:   Diagnosis Date   ??? Anemia    ??? Anxiety    ??? Chronic pain    ??? Cirrhosis of liver ([place])    ??? Diabetes ([place])    ??? GERD (gastroesophageal reflux disease)    ??? Hyperlipemia    ??? Hypertension    ??? Hypotension    ??? Migraine    ??? Other cirrhosis of liver ([place])    ??? Stroke [facility])     had 3 strokes    ??? Thrombocytopenia ([place])      Past Surgical History:   Procedure Laterality Date   ??? HX CHOLECYSTECTOMY     ??? HX ORTHOPAEDIC      R hand sx   ??? HX OTHER SURGICAL      Hand surgery- Nail gun went through finger     Social History     Tobacco Use   ??? Smoking status: Former Smoker   ??? Smokeless tobacco: Never Used   ??? Tobacco comment: quit years ago   Substance Use Topics   ??? Alcohol use: No       Family History   Problem Relation Age of Onset   ??? Alzheimer Mother    ??? Diabetes Mother    ??? Cancer Mother         colon cancer    ??? Hypertension Father    ??? Heart Disease Father    ??? Cancer Father         prostate, lung cancer    ??? Diabetes Sister    ??? Heart Disease Sister    ??? Diabetes Brother        Review of Systems   Constitutional: Negative for chills, fever, malaise/fatigue and weight loss.   HENT: Positive for hearing loss. Negative for congestion, ear discharge, ear pain, nosebleeds, sinus pain and sore throat.     Eyes: Negative for blurred vision, double vision and discharge.   Respiratory: Negative for cough, hemoptysis, sputum production, shortness of breath and wheezing.    Cardiovascular: Negative for chest pain, palpitations, claudication and leg swelling.   Gastrointestinal: Positive for abdominal pain. Negative for constipation, diarrhea, nausea and vomiting.   Genitourinary: Negative for dysuria, frequency and urgency.   Musculoskeletal: Positive for back pain and joint pain. Negative for myalgias.   Skin: Negative for itching and rash.   Neurological: Negative for dizziness, tingling, sensory change, speech change, focal weakness, weakness and headaches.   Psychiatric/Behavioral: Negative for depression and suicidal ideas. The  patient is nervous/anxious.        Physical Exam   Constitutional: He is oriented to person, place, and time. He appears well-developed and well-nourished. No distress.   HENT:   Head: Normocephalic and atraumatic.   Mouth/Throat: No oropharyngeal exudate.   Eyes: Pupils are equal, round, and reactive to light. Conjunctivae are normal. Right eye exhibits no discharge. Left eye exhibits no discharge. No scleral icterus.   Neck: Normal range of motion. Neck supple. No thyromegaly present.   Cardiovascular: Normal rate, regular rhythm and normal heart sounds.   Pulmonary/Chest: Effort normal and breath sounds normal. No respiratory distress. He has no rales.   Abdominal: Soft. Bowel sounds are normal. He exhibits no distension. There is no tenderness. There is no rebound.   Musculoskeletal: Normal range of motion. He exhibits no edema, tenderness or deformity.   Lymphadenopathy:     He has no cervical adenopathy.   Neurological: He is alert and oriented to person, place, and time. No cranial nerve deficit. Coordination normal.   Skin: Skin is warm and dry. He is not diaphoretic.   Psychiatric: He has a normal mood and affect. Judgment and thought content normal.    Nursing note and vitals reviewed.       Assessment and plan     Plan of care has been discussed with the patient, he agrees to the plan and verbalized understanding.  All his questions were answered  More than 50% of the time spent in this visit was counseling the patient about  illness and treatment options         1. Anxiety     I have discussed with patient and his wife  in length about options to treat anxiety other than ativan ,it is not  Safe  for his age , I have reached out to his neurologist and we discussed the option of Effexor for migraine prevention as well as t treat anxiety and he agrees and he will start it with the patient  During next week visit      - LORazepam (ATIVAN) 1 mg tablet; Take 1 Tab by mouth every eight (8) hours as needed for Anxiety for up to 90 days. Max Daily Amount: 3 mg.  Dispense: 90 Tab; Refill: 0    2. Migraine without aura and without status migrainosus, not intractable      I have discussed with patient that this medication is not safe with his history of CVA , wife requested refill till the see the neurologist because severe headaches attack !!  They understand the risk and this will be the last refill.  - SUMAtriptan (IMITREX) 100 mg tablet; Take 1 Tab by mouth once as needed for Migraine for up to 1 dose.  Dispense: 10 Tab; Refill: 0      Restless leg syndrome     He is doing well on mirapex    Current Outpatient Medications   Medication Sig Dispense Refill   ??? LORazepam (ATIVAN) 1 mg tablet Take 1 Tab by mouth every eight (8) hours as needed for Anxiety for up to 90 days. Max Daily Amount: 3 mg. 90 Tab 0   ??? pramipexole (MIRAPEX) 0.125 mg tablet TAKE 1 TABLET BY MOUTH NIGHTLY 30 Tab 0   ??? propranolol (INDERAL) 20 mg tablet TAKE 1 TABLET BY MOUTH THREE TIMES DAILY 90 Tab 0   ??? sucralfate (CARAFATE) 1 gram tablet TAKE 1 TABLET BY MOUTH FOUR TIMES DAILY WITH MEALS AND AT BEDTIME 120  Tab 0   ??? GENERLAC 10 gram/15 mL solution TAKE 30CC BY MOUTH TWICE DAILY 1892 mL 0    ??? finasteride (PROSCAR) 5 mg tablet TAKE 1 TABLET BY MOUTH DAILY 30 Tab 0   ??? nortriptyline (PAMELOR) 10 mg capsule Take 10 mg by mouth nightly.     ??? metFORMIN (GLUCOPHAGE) 500 mg tablet TAKE 2 TABLETS BY MOUTH TWICE DAILY WITH MEALS(GENERIC FOR GLUCOPHAGE) 360 Tab 0   ??? rosuvastatin (CRESTOR) 10 mg tablet TAKE 1 TABLET BY MOUTH NIGHTLY 90 Tab 0   ??? lancets (ONETOUCH ULTRASOFT LANCETS) misc Use to test blood sugars four time daily. 1 Each 11   ??? glucose blood VI test strips (ASCENSIA AUTODISC VI, ONE TOUCH ULTRA TEST VI) strip Use to test blood sugars four times daily with Onetouch ultra 2. 400 Strip 3   ??? Insulin Needles, Disposable, (BD ULTRA-FINE SHORT PEN NEEDLE) 31 gauge x 5/16" ndle Use 4 times daily to inject insulin. 1 Package 11   ??? topiramate (TOPAMAX) 100 mg tablet TAKE 1 TABLET BY MOUTH DAILY 90 Tab 0   ??? citalopram (CELEXA) 40 mg tablet TAKE 1 TABLET BY MOUTH DAILY 90 Tab 0   ??? oxyCODONE IR (ROXICODONE) 10 mg tab immediate release tablet Take 10 mg by mouth every six (6) hours as needed for Pain.     ??? butalbital-acetaminophen-caffeine (FIORICET, ESGIC) 50-325-40 mg per tablet Take 1 Tab by mouth every six (6) hours as needed for Pain. 20 Tab 1   ??? HUMULIN R REGULAR U-100 INSULN 100 unit/mL injection As needed per sliding scale 1 Vial 6   ??? Insulin Syringe-Needle U-100 1 mL 29 gauge x 1/2" syrg Three times per day and as needed as per sliding scale 100 Syringe 6   ??? prochlorperazine (COMPAZINE) 10 mg tablet Take 0.5 Tabs by mouth every six (6) hours as needed. 30 Tab 5   ??? gemfibrozil (LOPID) 600 mg tablet TAKE 1 TABLET BY MOUTH TWICE DAILY 180 Tab 3   ??? ketoconazole (NIZORAL) 2 % topical cream Apply  to affected area daily. 15 g 0   ??? metaxalone (SKELAXIN) 800 mg tablet Take 800 mg by mouth three (3) times daily as needed.     ??? augmented betamethasone dipropionate (DIPROLENE-AF) 0.05 % ointment Apply  to affected area two (2) times a day.      ??? naloxone (NARCAN) 4 mg/actuation nasal spray Use 1 spray intranasally into 1 nostril. Use a new Narcan nasal spray for subsequent doses and administer into alternating nostrils. May repeat every 2 to 3 minutes as needed for opioid overdose symptoms. 1 Each 0   ??? aspirin 81 mg chewable tablet Take 81 mg by mouth daily.     ??? cholecalciferol, vitamin D3, (VITAMIN D3) 2,000 unit tab Take  by mouth.     ??? B.infantis-B.ani-B.long-B.bifi (PROBIOTIC 4X) 10-15 mg TbEC Take  by mouth.     ??? cyclobenzaprine (FLEXERIL) 10 mg tablet Take  by mouth three (3) times daily as needed for Muscle Spasm(s).         Patient Active Problem List    Diagnosis Date Noted   ??? Restless leg syndrome 12/04/2017   ??? Migraine without aura and without status migrainosus, not intractable 04/15/2017   ??? 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 Dermatitis 01/05/2017   ??? Benign prostatic hyperplasia (BPH) with straining on urination 12/31/2016   ??? 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 Myelodysplastic syndrome ([place]) 12/22/2016   ???  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 Iron deficiency anemia secondary to inadequate dietary iron intake 09/23/2016   ??? Chronic leukopenia 09/23/2016   ??? Thrombocytopenia ([place]) 09/23/2016   ??? 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 Type II diabetes mellitus ([place]) 08/03/2016   ??? HTN (hypertension) 08/03/2016   ??? 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 Stroke ([place]) 08/03/2016   ??? Compressed vertebrae ([place]) 08/03/2016   ??? Chronic back pain greater than 3 months duration 08/03/2016   ??? Hyperlipemia 08/03/2016   ??? Anxiety 08/03/2016   ??? Depression 06/30/2016   ??? Iron deficiency anemia 04/22/2016   ??? NAFLD (nonalcoholic fatty liver disease) 04/22/2016   ??? Other cirrhosis of liver ([place]) 04/22/2016   ??? Chronic anemia 03/18/2016   ??? Controlled type 2 diabetes mellitus without complication, without long-term current use of insulin ([place]) 03/18/2016   ??? Essential hypertension 03/18/2016    ??? Hyperlipidemia 03/18/2016   ??? History of stroke 03/18/2016   ??? Anxiety 03/18/2016   ??? Chronic pain 03/18/2016     Results for orders placed or performed during the hospital encounter of 22/02/54   METABOLIC PANEL, COMPREHENSIVE   Result Value Ref Range    Sodium 139 136 - 145 mmol/L    Potassium 4.9 3.5 - 5.5 mmol/L    Chloride 108 100 - 108 mmol/L    CO2 19 (L) 21 - 32 mmol/L    Anion gap 12 3.0 - 18 mmol/L    Glucose 141 (H) 74 - 99 mg/dL    BUN 18 7.0 - 18 MG/DL    Creatinine 1.11 0.6 - 1.3 MG/DL    BUN/Creatinine ratio 16 12 - 20      GFR est AA >60 >60 ml/min/1.71m    GFR est non-AA >60 >60 ml/min/1.75m   Calcium 9.9 8.5 - 10.1 MG/DL    Bilirubin, total 0.5 0.2 - 1.0 MG/DL    ALT (SGPT) 27 16 - 61 U/L    AST (SGOT) 33 15 - 37 U/L    Alk. phosphatase 174 (H) 45 - 117 U/L    Protein, total 7.8 6.4 - 8.2 g/dL    Albumin 4.6 3.4 - 5.0 g/dL    Globulin 3.2 2.0 - 4.0 g/dL    A-G Ratio 1.4 0.8 - 1.7     IRON PROFILE   Result Value Ref Range    Iron 60 50 - 175 ug/dL    TIBC 442 250 - 450 ug/dL    Iron % saturation 14 %   FERRITIN   Result Value Ref Range    Ferritin 51 8 - 388 NG/ML     Hospital Outpatient Visit on 11/17/2017   Component Date Value Ref Range Status   ??? Sodium 11/17/2017 139  136 - 145 mmol/L Final   ??? Potassium 11/17/2017 4.9  3.5 - 5.5 mmol/L Final   ??? Chloride 11/17/2017 108  100 - 108 mmol/L Final   ??? CO2 11/17/2017 19* 21 - 32 mmol/L Final   ??? Anion gap 11/17/2017 12  3.0 - 18 mmol/L Final   ??? Glucose 11/17/2017 141* 74 - 99 mg/dL Final   ??? BUN 11/17/2017 18  7.0 - 18 MG/DL Final   ??? Creatinine 11/17/2017 1.11  0.6 - 1.3 MG/DL Final   ??? BUN/Creatinine ratio 11/17/2017 16  12 - 20   Final   ??? GFR est AA 11/17/2017 >60  >60 ml/min/1.7311minal   ??? GFR est non-AA 11/17/2017 >60  >  60 ml/min/1.64m Final    Comment: (NOTE)  Estimated GFR is calculated using the Modification of Diet in Renal   Disease (MDRD) Study equation, reported for both African Americans    (GFRAA) and non-African Americans (GFRNA), and normalized to 1.738m  body surface area. The physician must decide which value applies to   the patient. The MDRD study equation should only be used in   individuals age 138r older. It has not been validated for the   following: pregnant women, patients with serious comorbid conditions,   or on certain medications, or persons with extremes of body size,   muscle mass, or nutritional status.     ??? Calcium 11/17/2017 9.9  8.5 - 10.1 MG/DL Final   ??? Bilirubin, total 11/17/2017 0.5  0.2 - 1.0 MG/DL Final   ??? ALT (SGPT) 11/17/2017 27  16 - 61 U/L Final   ??? AST (SGOT) 11/17/2017 33  15 - 37 U/L Final   ??? Alk. phosphatase 11/17/2017 174* 45 - 117 U/L Final   ??? Protein, total 11/17/2017 7.8  6.4 - 8.2 g/dL Final   ??? Albumin 11/17/2017 4.6  3.4 - 5.0 g/dL Final   ??? Globulin 11/17/2017 3.2  2.0 - 4.0 g/dL Final   ??? A-G Ratio 11/17/2017 1.4  0.8 - 1.7   Final   ??? Iron 11/17/2017 60  50 - 175 ug/dL Final    Patients receiving metal-binding drugs (e.g. deferoxamine) may show spuriously depressed iron values, as chelated iron may not properly react in the iron assay.   ??? TIBC 11/17/2017 442  250 - 450 ug/dL Final   ??? Iron % saturation 11/17/2017 14  % Final   ??? Ferritin 11/17/2017 51  8 - 388 NG/ML Final   Hospital Outpatient Visit on 11/17/2017   Component Date Value Ref Range Status   ??? WBC 11/17/2017 2.7* 4.5 - 13.0 K/uL Final   ??? RBC 11/17/2017 4.01* 4.10 - 5.10 M/uL Final   ??? HGB 11/17/2017 11.6* 12.0 - 16.0 g/dL Final   ??? HCT 11/17/2017 36.6  36 - 48 % Final   ??? MCV 11/17/2017 91.3  78 - 102 FL Final   ??? MCH 11/17/2017 28.9  25.0 - 35.0 PG Final   ??? MCHC 11/17/2017 31.7  31 - 37 g/dL Final   ??? RDW 11/17/2017 18.6* 11.5 - 14.5 % Final   ??? PLATELET 11/17/2017 83* 140 - 440 K/uL Final   ??? NEUTROPHILS 11/17/2017 78* 40 - 70 % Final   ??? MIXED CELLS 11/17/2017 7  0.1 - 17 % Final   ??? LYMPHOCYTES 11/17/2017 15  14 - 44 % Final    ??? ABS. NEUTROPHILS 11/17/2017 2.1  1.8 - 9.5 K/UL Final   ??? ABS. MIXED CELLS 11/17/2017 0.2  0.0 - 2.3 K/uL Final   ??? ABS. LYMPHOCYTES 11/17/2017 0.4* 1.1 - 5.9 K/UL Final    Test performed at [facility] Location. Reviewed by Medical Director.   ??? DF 11/17/2017 AUTOMATED    Final   Hospital Outpatient Visit on 10/19/2017   Component Date Value Ref Range Status   ??? WBC 10/19/2017 3.2* 4.5 - 13.0 K/uL Final   ??? RBC 10/19/2017 4.11  4.10 - 5.10 M/uL Final   ??? HGB 10/19/2017 11.4* 12.0 - 16.0 g/dL Final   ??? HCT 10/19/2017 36.3  36 - 48 % Final   ??? MCV 10/19/2017 88.3  78 - 102 FL  Final   ??? MCH 10/19/2017 27.7  25.0 - 35.0 PG Final   ??? MCHC 10/19/2017 31.4  31 - 37 g/dL Final   ??? RDW 10/19/2017 20.1* 11.5 - 14.5 % Final   ??? PLATELET 10/19/2017 78* 140 - 440 K/uL Final   ??? NEUTROPHILS 10/19/2017 76* 40 - 70 % Final   ??? MIXED CELLS 10/19/2017 5  0.1 - 17 % Final   ??? LYMPHOCYTES 10/19/2017 18  14 - 44 % Final   ??? ABS. NEUTROPHILS 10/19/2017 2.4  1.8 - 9.5 K/UL Final   ??? ABS. MIXED CELLS 10/19/2017 0.2  0.0 - 2.3 K/uL Final   ??? ABS. LYMPHOCYTES 10/19/2017 0.6* 1.1 - 5.9 K/UL Final    Test performed at [place] or [facility]fusion [facility] Location. Reviewed by Medical Director.   ??? DF 10/19/2017 AUTOMATED    Final   Hospital Outpatient Visit on 10/04/2017   Component Date Value Ref Range Status   ??? WBC 10/04/2017 2.9* 4.5 - 13.0 K/uL Final   ??? RBC 10/04/2017 3.96* 4.10 - 5.10 M/uL Final   ??? HGB 10/04/2017 10.2* 12.0 - 16.0 g/dL Final   ??? HCT 10/04/2017 34.7* 36 - 48 % Final   ??? MCV 10/04/2017 87.6  78 - 102 FL Final   ??? MCH 10/04/2017 25.8  25.0 - 35.0 PG Final   ??? MCHC 10/04/2017 29.4* 31 - 37 g/dL Final   ??? RDW 10/04/2017 19.2* 11.5 - 14.5 % Final   ??? PLATELET 10/04/2017 83* 140 - 440 K/uL Final   ??? NEUTROPHILS 10/04/2017 74* 40 - 70 % Final   ??? MIXED CELLS 10/04/2017 10  0.1 - 17 % Final    ??? LYMPHOCYTES 10/04/2017 17  14 - 44 % Final   ??? ABS. NEUTROPHILS 10/04/2017 2.1  1.8 - 9.5 K/UL Final   ??? ABS. MIXED CELLS 10/04/2017 0.3  0.0 - 2.3 K/uL Final   ??? ABS. LYMPHOCYTES 10/04/2017 0.5* 1.1 - 5.9 K/UL Final    Test performed at [place] or [facility] Location. Reviewed by Medical Director.   ??? DF 10/04/2017 AUTOMATED    Final   Hospital Outpatient Visit on 09/20/2017   Component Date Value Ref Range Status   ??? WBC 09/20/2017 3.4* 4.5 - 13.0 K/uL Final   ??? RBC 09/20/2017 3.86* 4.10 - 5.10 M/uL Final   ??? HGB 09/20/2017 9.8* 12.0 - 16.0 g/dL Final   ??? HCT 09/20/2017 33.1* 36 - 48 % Final   ??? MCV 09/20/2017 85.8  78 - 102 FL Final   ??? MCH 09/20/2017 25.4  25.0 - 35.0 PG Final   ??? MCHC 09/20/2017 29.6* 31 - 37 g/dL Final   ??? RDW 09/20/2017 17.3* 11.5 - 14.5 % Final   ??? PLATELET 09/20/2017 97* 140 - 440 K/uL Final   ??? NEUTROPHILS 09/20/2017 76* 40 - 70 % Final   ??? MIXED CELLS 09/20/2017 12  0.1 - 17 % Final   ??? LYMPHOCYTES 09/20/2017 12* 14 - 44 % Final   ??? ABS. NEUTROPHILS 09/20/2017 2.6  1.8 - 9.5 K/UL Final   ??? ABS. MIXED CELLS 09/20/2017 0.4  0.0 - 2.3 K/uL Final   ??? ABS. LYMPHOCYTES 09/20/2017 0.4* 1.1 - 5.9 K/UL Final    Test performed at [place] or [facility]patient [facility]fusion [facility] Location. Reviewed by Medical Director.   ??? DF 09/20/2017 AUTOMATED    Final  Hospital Outpatient Visit on 09/06/2017   Component Date Value Ref Range Status   ??? WBC 09/06/2017 3.4* 4.5 - 13.0 K/uL Final   ??? RBC 09/06/2017 3.74* 4.10 - 5.10 M/uL Final   ??? HGB 09/06/2017 9.1* 12.0 - 16.0 g/dL Final   ??? HCT 09/06/2017 31.4* 36 - 48 % Final   ??? MCV 09/06/2017 84.0  78 - 102 FL Final   ??? MCH 09/06/2017 24.3* 25.0 - 35.0 PG Final   ??? MCHC 09/06/2017 29.0* 31 - 37 g/dL Final   ??? RDW 09/06/2017 15.6* 11.5 - 14.5 % Final   ??? PLATELET 09/06/2017 88* 140 - 440 K/uL Final   ??? NEUTROPHILS 09/06/2017 76* 40 - 70 % Final    ??? MIXED CELLS 09/06/2017 12  0.1 - 17 % Final   ??? LYMPHOCYTES 09/06/2017 13* 14 - 44 % Final   ??? ABS. NEUTROPHILS 09/06/2017 2.6  1.8 - 9.5 K/UL Final   ??? ABS. MIXED CELLS 09/06/2017 0.4  0.0 - 2.3 K/uL Final   ??? ABS. LYMPHOCYTES 09/06/2017 0.4* 1.1 - 5.9 K/UL Final    Test performed at [place] or [facility]patient [facility]fusion [facility] Location. Reviewed by Medical Director.   ??? DF 09/06/2017 AUTOMATED    Final          Follow-up and Dispositions    ?? Return in about 2 months (around 02/01/2018).

## 2017-12-02 NOTE — Progress Notes (Signed)
William Blalock Stumpo Sr. is a 73 y.o. male presents to office for medication refill    Medication list has been reviewed with William Burger Sr. and updated as of today's date     Health Maintenance items with a due date reviewed with patient:  Health Maintenance Due   Topic Date Due   ??? Hepatitis C Screening  August 28, 1945   ??? FOOT EXAM Q1  03/19/1958   ??? MICROALBUMIN Q1  03/19/1958   ??? EYE EXAM RETINAL OR DILATED  03/19/1958   ??? DTaP/Tdap/Td series (1 - Tdap) 03/19/1969   ??? Shingrix Vaccine Age 108> (1 of 2) 03/19/1998   ??? GLAUCOMA SCREENING Q2Y  03/19/2013   ??? Pneumococcal 65+ years (1 of 2 - PCV13) 03/19/2013   ??? HEMOGLOBIN A1C Q6M  12/28/2016   ??? LIPID PANEL Q1  06/30/2017   ??? MEDICARE YEARLY EXAM  09/11/2017   ??? FOBT Q 1 YEAR AGE 107-75  10/12/2017

## 2017-12-04 DIAGNOSIS — G2581 Restless legs syndrome: Secondary | ICD-10-CM

## 2017-12-07 ENCOUNTER — Institutional Professional Consult (permissible substitution): Admit: 2017-12-07 | Discharge: 2017-12-07 | Payer: PRIVATE HEALTH INSURANCE | Primary: Legal Medicine

## 2017-12-07 ENCOUNTER — Inpatient Hospital Stay: Admit: 2017-12-07 | Primary: Legal Medicine

## 2017-12-07 ENCOUNTER — Inpatient Hospital Stay: Admit: 2017-12-07 | Payer: MEDICARE | Primary: Legal Medicine

## 2017-12-07 DIAGNOSIS — D508 Other iron deficiency anemias: Secondary | ICD-10-CM

## 2017-12-07 LAB — CBC WITH 3 PART DIFF
ABS. LYMPHOCYTES: 0.5 10*3/uL — ABNORMAL LOW (ref 1.1–5.9)
ABS. MIXED CELLS: 0.3 10*3/uL (ref 0.0–2.3)
ABS. NEUTROPHILS: 2.3 10*3/uL (ref 1.8–9.5)
HCT: 37 % (ref 36–48)
HGB: 12 g/dL (ref 12.0–16.0)
LYMPHOCYTES: 16 % (ref 14–44)
MCH: 29.7 PG (ref 25.0–35.0)
MCHC: 32.4 g/dL (ref 31–37)
MCV: 91.6 FL (ref 78–102)
Mixed cells: 10 % (ref 0.1–17)
NEUTROPHILS: 74 % — ABNORMAL HIGH (ref 40–70)
PLATELET: 76 10*3/uL — ABNORMAL LOW (ref 140–440)
RBC: 4.04 M/uL — ABNORMAL LOW (ref 4.10–5.10)
RDW: 16.4 % — ABNORMAL HIGH (ref 11.5–14.5)
WBC: 3.1 10*3/uL — ABNORMAL LOW (ref 4.5–13.0)

## 2017-12-07 MED ORDER — EPOETIN ALFA-EPBX 40,000 UNIT/ML INJECTION SOLUTION
40000 unit/mL | Freq: Once | INTRAMUSCULAR | Status: DC
Start: 2017-12-07 — End: 2017-12-08

## 2017-12-07 NOTE — Progress Notes (Signed)
[facility] Progress Note    Date: December 07, 2017    Name: William Blanchard [facility] Sr.    MRN: [id number]         DOB: 1945-12-26      William Blanchard arrived in the [facility] today at 1600, in stable condition, here for Q 2 Week, CBC/Retacrit injection. He was assessed and education was provided.     William Blanchard vitals were reviewed.  Visit Vitals  BP 110/61 (BP 1 Location: Right arm, BP Patient Position: Sitting)   Pulse 65   Temp 97.8 ??F (36.6 ??C)   Resp 20   SpO2 95%           CBC was drawn from his right AC at 1608, per order, and without incident.           Lab results were obtained and reviewed.  Recent Results (from the past 12 hour(s))   CBC WITH 3 PART DIFF    Collection Time: 12/07/17  4:08 PM   Result Value Ref Range    WBC 3.1 (L) 4.5 - 13.0 K/uL    RBC 4.04 (L) 4.10 - 5.10 M/uL    HGB 12.0 12.0 - 16.0 g/dL    HCT 37.0 36 - 48 %    MCV 91.6 78 - 102 FL    MCH 29.7 25.0 - 35.0 PG    MCHC 32.4 31 - 37 g/dL    RDW 16.4 (H) 11.5 - 14.5 %    PLATELET 76 (L) 140 - 440 K/uL    NEUTROPHILS 74 (H) 40 - 70 %    MIXED CELLS 10 0.1 - 17 %    LYMPHOCYTES 16 14 - 44 %    ABS. NEUTROPHILS 2.3 1.8 - 9.5 K/UL    ABS. MIXED CELLS 0.3 0.0 - 2.3 K/uL    ABS. LYMPHOCYTES 0.5 (L) 1.1 - 5.9 K/UL    DF AUTOMATED             Retacrit Injection was HELD today per order.             William Blanchard tolerated well, and had no complaints.    William Blanchard was discharged from [facility] in stable condition at 1620. He is to return in 2 weeks, on  Wedneday, 12-21-17,  at 1000,  for his next [facility] appointment for CBC/Retacrit injection.     [name], RN  December 07, 2017  4:41 PM

## 2017-12-14 ENCOUNTER — Encounter: Payer: MEDICARE | Primary: Legal Medicine

## 2017-12-19 ENCOUNTER — Encounter

## 2017-12-19 MED ORDER — TOPIRAMATE 100 MG TAB
100 mg | ORAL_TABLET | ORAL | 0 refills | Status: DC
Start: 2017-12-19 — End: 2018-03-27

## 2017-12-19 MED ORDER — FINASTERIDE 5 MG TAB
5 mg | ORAL_TABLET | ORAL | 0 refills | Status: DC
Start: 2017-12-19 — End: 2018-03-22

## 2017-12-19 MED ORDER — CITALOPRAM 40 MG TAB
40 mg | ORAL_TABLET | ORAL | 0 refills | Status: DC
Start: 2017-12-19 — End: 2018-03-30

## 2017-12-21 ENCOUNTER — Inpatient Hospital Stay: Admit: 2017-12-21 | Primary: Legal Medicine

## 2017-12-21 ENCOUNTER — Institutional Professional Consult (permissible substitution): Admit: 2017-12-21 | Discharge: 2017-12-21 | Payer: PRIVATE HEALTH INSURANCE | Primary: Legal Medicine

## 2017-12-21 ENCOUNTER — Inpatient Hospital Stay: Admit: 2017-12-21 | Payer: MEDICARE | Primary: Legal Medicine

## 2017-12-21 DIAGNOSIS — D649 Anemia, unspecified: Secondary | ICD-10-CM

## 2017-12-21 LAB — CBC WITH 3 PART DIFF
ABS. LYMPHOCYTES: 0.4 10*3/uL — ABNORMAL LOW (ref 1.1–5.9)
ABS. MIXED CELLS: 0.4 10*3/uL (ref 0.0–2.3)
ABS. NEUTROPHILS: 3 10*3/uL (ref 1.8–9.5)
HCT: 36.7 % (ref 36–48)
HGB: 11.9 g/dL — ABNORMAL LOW (ref 12.0–16.0)
LYMPHOCYTES: 11 % — ABNORMAL LOW (ref 14–44)
MCH: 29.9 PG (ref 25.0–35.0)
MCHC: 32.4 g/dL (ref 31–37)
MCV: 92.2 FL (ref 78–102)
Mixed cells: 11 % (ref 0.1–17)
NEUTROPHILS: 78 % — ABNORMAL HIGH (ref 40–70)
PLATELET: 83 10*3/uL — ABNORMAL LOW (ref 140–440)
RBC: 3.98 M/uL — ABNORMAL LOW (ref 4.10–5.10)
RDW: 15.4 % — ABNORMAL HIGH (ref 11.5–14.5)
WBC: 3.8 10*3/uL — ABNORMAL LOW (ref 4.5–13.0)

## 2017-12-21 MED ORDER — EPOETIN ALFA-EPBX 10,000 UNIT/ML INJECTION SOLUTION
10000 unit/mL | Freq: Once | INTRAMUSCULAR | Status: AC
Start: 2017-12-21 — End: 2017-12-21

## 2017-12-21 NOTE — Progress Notes (Signed)
[facility] Progress Note    Date: December 21, 2017    Name: William Buda [facility] Sr.    MRN: [id number]         DOB: 1945-12-14      William Blanchard arrived in the [facility] today at 1000, in stable condition, here for Q 2 Week, CBC/Retacrit injection. He was assessed and education was provided.     William Blanchard vitals were reviewed.  Visit Vitals  BP 109/64 (BP 1 Location: Right arm, BP Patient Position: Sitting)   Pulse 64   Temp 98.1 ??F (36.7 ??C)   Resp 16   SpO2 98%           CBC was drawn from his right AC at 1018, per order, and without incident.           Lab results were obtained and reviewed.  Recent Results (from the past 12 hour(s))   CBC WITH 3 PART DIFF    Collection Time: 12/21/17 10:18 AM   Result Value Ref Range    WBC 3.8 (L) 4.5 - 13.0 K/uL    RBC 3.98 (L) 4.10 - 5.10 M/uL    HGB 11.9 (L) 12.0 - 16.0 g/dL    HCT 36.7 36 - 48 %    MCV 92.2 78 - 102 FL    MCH 29.9 25.0 - 35.0 PG    MCHC 32.4 31 - 37 g/dL    RDW 15.4 (H) 11.5 - 14.5 %    PLATELET 83 (L) 140 - 440 K/uL    NEUTROPHILS 78 (H) 40 - 70 %    MIXED CELLS 11 0.1 - 17 %    LYMPHOCYTES 11 (L) 14 - 44 %    ABS. NEUTROPHILS 3.0 1.8 - 9.5 K/UL    ABS. MIXED CELLS 0.4 0.0 - 2.3 K/uL    ABS. LYMPHOCYTES 0.4 (L) 1.1 - 5.9 K/UL    DF AUTOMATED             Retacrit Injection was HELD today per order.             William Blanchard tolerated well, and had no complaints.    William Blanchard was discharged from [place] in stable condition at 1030.Marland Kitchen He is to return in 2 weeks, on  Wedneday, 01-04-18,  at 1600,  for his next [facility] appointment for CBC/Retacrit injection.     [name], RN  December 21, 2017  4:41 PM

## 2017-12-27 MED ORDER — SUCRALFATE 1 GRAM TAB
1 gram | ORAL_TABLET | ORAL | 0 refills | Status: DC
Start: 2017-12-27 — End: 2018-03-27

## 2017-12-28 ENCOUNTER — Encounter: Payer: MEDICARE | Primary: Legal Medicine

## 2017-12-28 ENCOUNTER — Encounter

## 2017-12-29 ENCOUNTER — Encounter

## 2017-12-30 MED ORDER — PROPRANOLOL 20 MG TAB
20 mg | ORAL_TABLET | ORAL | 0 refills | Status: DC
Start: 2017-12-30 — End: 2018-04-01

## 2017-12-30 MED ORDER — PRAMIPEXOLE 0.125 MG TAB
0.125 mg | ORAL_TABLET | ORAL | 0 refills | Status: AC
Start: 2017-12-30 — End: ?

## 2018-01-04 ENCOUNTER — Encounter: Primary: Legal Medicine

## 2018-01-04 ENCOUNTER — Inpatient Hospital Stay: Payer: MEDICARE | Primary: Legal Medicine

## 2018-01-04 DIAGNOSIS — D508 Other iron deficiency anemias: Secondary | ICD-10-CM

## 2018-01-05 ENCOUNTER — Inpatient Hospital Stay: Admit: 2018-01-05 | Payer: MEDICARE | Primary: Legal Medicine

## 2018-01-05 ENCOUNTER — Institutional Professional Consult (permissible substitution): Admit: 2018-01-05 | Discharge: 2018-01-05 | Payer: PRIVATE HEALTH INSURANCE | Primary: Legal Medicine

## 2018-01-05 ENCOUNTER — Inpatient Hospital Stay: Admit: 2018-01-05 | Primary: Legal Medicine

## 2018-01-05 DIAGNOSIS — D649 Anemia, unspecified: Secondary | ICD-10-CM

## 2018-01-05 LAB — CBC WITH 3 PART DIFF
ABS. LYMPHOCYTES: 0.5 10*3/uL — ABNORMAL LOW (ref 1.1–5.9)
ABS. MIXED CELLS: 0.3 10*3/uL (ref 0.0–2.3)
ABS. NEUTROPHILS: 2.2 10*3/uL (ref 1.8–9.5)
HCT: 34.6 % — ABNORMAL LOW (ref 36–48)
HGB: 11.1 g/dL — ABNORMAL LOW (ref 12.0–16.0)
LYMPHOCYTES: 16 % (ref 14–44)
MCH: 29.8 PG (ref 25.0–35.0)
MCHC: 32.1 g/dL (ref 31–37)
MCV: 93 FL (ref 78–102)
Mixed cells: 11 % (ref 0.1–17)
NEUTROPHILS: 73 % — ABNORMAL HIGH (ref 40–70)
PLATELET: 77 10*3/uL — ABNORMAL LOW (ref 140–440)
RBC: 3.72 M/uL — ABNORMAL LOW (ref 4.10–5.10)
RDW: 14.9 % — ABNORMAL HIGH (ref 11.5–14.5)
WBC: 3 10*3/uL — ABNORMAL LOW (ref 4.5–13.0)

## 2018-01-05 MED ORDER — EPOETIN ALFA-EPBX 10,000 UNIT/ML INJECTION SOLUTION
10000 unit/mL | Freq: Once | INTRAMUSCULAR | Status: AC
Start: 2018-01-05 — End: 2018-01-05

## 2018-01-05 MED ORDER — GENERLAC 10 GRAM/15 ML ORAL SOLUTION
10 gram/15 mL | ORAL | 0 refills | Status: DC
Start: 2018-01-05 — End: 2018-05-16

## 2018-01-05 NOTE — Progress Notes (Signed)
[facility] Progress Note    Date: January 05, 2018    Name: William Russi [facility] Sr.    MRN: [id number]         DOB: 10/15/1945      [name] was assessed and education was provided.     [name] vitals were reviewed and patient was observed for 5 minutes prior to treatment.   Visit Vitals  BP 93/54 (BP 1 Location: Left arm, BP Patient Position: At rest;Sitting)   Pulse 68   Temp 97.9 ??F (36.6 ??C)   Resp 16   SpO2 99%       Lab results were obtained and reviewed.  Recent Results (from the past 12 hour(s))   CBC WITH 3 PART DIFF    Collection Time: 01/05/18 10:53 AM   Result Value Ref Range    WBC 3.0 (L) 4.5 - 13.0 K/uL    RBC 3.72 (L) 4.10 - 5.10 M/uL    HGB 11.1 (L) 12.0 - 16.0 g/dL    HCT 34.6 (L) 36 - 48 %    MCV 93.0 78 - 102 FL    MCH 29.8 25.0 - 35.0 PG    MCHC 32.1 31 - 37 g/dL    RDW 14.9 (H) 11.5 - 14.5 %    PLATELET 77 (L) 140 - 440 K/uL    NEUTROPHILS 73 (H) 40 - 70 %    MIXED CELLS 11 0.1 - 17 %    LYMPHOCYTES 16 14 - 44 %    ABS. NEUTROPHILS 2.2 1.8 - 9.5 K/UL    ABS. MIXED CELLS 0.3 0.0 - 2.3 K/uL    ABS. LYMPHOCYTES 0.5 (L) 1.1 - 5.9 K/UL    DF AUTOMATED         Retacrit not given for H&H 11.1/34.6.      Patient armband removed and shredded.    Mr. General was discharged from [place] in stable condition at 1115. He is to return on 01/18/18 at 1000 for his next appointment for CBC/Retacrot Q 2 wks.    Lynnda Shields, RN  January 05, 2018  11:28 AM

## 2018-01-11 ENCOUNTER — Encounter: Payer: MEDICARE | Primary: Legal Medicine

## 2018-01-18 ENCOUNTER — Inpatient Hospital Stay: Admit: 2018-01-18 | Payer: MEDICARE | Primary: Legal Medicine

## 2018-01-18 ENCOUNTER — Institutional Professional Consult (permissible substitution): Admit: 2018-01-18 | Discharge: 2018-01-18 | Payer: PRIVATE HEALTH INSURANCE | Primary: Legal Medicine

## 2018-01-18 ENCOUNTER — Inpatient Hospital Stay: Admit: 2018-01-18 | Primary: Legal Medicine

## 2018-01-18 DIAGNOSIS — D649 Anemia, unspecified: Secondary | ICD-10-CM

## 2018-01-18 LAB — CBC WITH 3 PART DIFF
ABS. LYMPHOCYTES: 0.4 10*3/uL — ABNORMAL LOW (ref 1.1–5.9)
ABS. MIXED CELLS: 0.4 10*3/uL (ref 0.0–2.3)
ABS. NEUTROPHILS: 2.9 10*3/uL (ref 1.8–9.5)
HCT: 34.2 % — ABNORMAL LOW (ref 36–48)
HGB: 11.3 g/dL — ABNORMAL LOW (ref 12.0–16.0)
LYMPHOCYTES: 12 % — ABNORMAL LOW (ref 14–44)
MCH: 30.5 PG (ref 25.0–35.0)
MCHC: 33 g/dL (ref 31–37)
MCV: 92.2 FL (ref 78–102)
Mixed cells: 10 % (ref 0.1–17)
NEUTROPHILS: 79 % — ABNORMAL HIGH (ref 40–70)
PLATELET: 80 10*3/uL — ABNORMAL LOW (ref 140–440)
RBC: 3.71 M/uL — ABNORMAL LOW (ref 4.10–5.10)
RDW: 14.5 % (ref 11.5–14.5)
WBC: 3.7 10*3/uL — ABNORMAL LOW (ref 4.5–13.0)

## 2018-01-18 MED ORDER — EPOETIN ALFA-EPBX 10,000 UNIT/ML INJECTION SOLUTION
10000 unit/mL | Freq: Once | INTRAMUSCULAR | Status: AC
Start: 2018-01-18 — End: 2018-01-18

## 2018-01-18 NOTE — Progress Notes (Signed)
[facility] OP[facility] Progress Note    Date: January 18, 2018    Name: William [facility] Sr.    MRN: [id number]         DOB: 1946/04/21      William Blanchard was assessed and education was provided.     William Blanchard vitals were reviewed and patient was observed for 5 minutes prior to treatment.   Visit Vitals  BP 109/63 (BP 1 Location: Right arm, BP Patient Position: At rest)   Pulse (!) 56   Temp 98 ??F (36.7 ??C)   Resp 20   SpO2 97%       Lab results were obtained and reviewed.  Recent Results (from the past 12 hour(s))   CBC WITH 3 PART DIFF    Collection Time: 01/18/18 10:14 AM   Result Value Ref Range    WBC 3.7 (L) 4.5 - 13.0 K/uL    RBC 3.71 (L) 4.10 - 5.10 M/uL    HGB 11.3 (L) 12.0 - 16.0 g/dL    HCT 34.2 (L) 36 - 48 %    MCV 92.2 78 - 102 FL    MCH 30.5 25.0 - 35.0 PG    MCHC 33.0 31 - 37 g/dL    RDW 14.5 11.5 - 14.5 %    PLATELET 80 (L) 140 - 440 K/uL    NEUTROPHILS 79 (H) 40 - 70 %    MIXED CELLS 10 0.1 - 17 %    LYMPHOCYTES 12 (L) 14 - 44 %    ABS. NEUTROPHILS 2.9 1.8 - 9.5 K/UL    ABS. MIXED CELLS 0.4 0.0 - 2.3 K/uL    ABS. LYMPHOCYTES 0.4 (L) 1.1 - 5.9 K/UL    DF AUTOMATED         Retacrit not given for H&H 11.3/34.2.    Patient armband removed and shredded.    William Blanchard was discharged from [place] in stable condition at 1025. He is to return on 02/01/18 at 1300 for his next appointment for CBC/Retacrot Q 2 wks.    Josephina Shih, RN  January 18, 2018

## 2018-01-19 ENCOUNTER — Inpatient Hospital Stay: Admit: 2018-01-19 | Payer: MEDICARE | Primary: Legal Medicine

## 2018-01-19 ENCOUNTER — Ambulatory Visit
Admit: 2018-01-19 | Discharge: 2018-01-19 | Payer: PRIVATE HEALTH INSURANCE | Attending: Hematology & Oncology | Primary: Legal Medicine

## 2018-01-19 DIAGNOSIS — D696 Thrombocytopenia, unspecified: Secondary | ICD-10-CM

## 2018-01-19 DIAGNOSIS — D508 Other iron deficiency anemias: Secondary | ICD-10-CM

## 2018-01-19 NOTE — Patient Instructions (Signed)
Complete Blood Count (CBC): About This Test  What is it?    A complete blood count (CBC) is a blood test that gives important information about your blood cells, especially red blood cells, white blood cells, and platelets.  Why is this test done?  A CBC may be done as part of a regular physical exam. There are many other reasons that a doctor may want this blood test, including to:  ?? Find the cause of symptoms such as fatigue, weakness, fever, bruising, or weight loss.  ?? Find anemia or an infection.  ?? See how much blood has been lost if there is bleeding.  ?? Diagnose diseases of the blood, such as leukemia or polycythemia.  How can you prepare for the test?  You do not need to do anything before having this test.  What happens during the test?  The health professional taking a sample of your blood will:  ?? Wrap an elastic band around your upper arm. This makes the veins below the band larger so it is easier to put a needle into the vein.  ?? Clean the needle site with alcohol.  ?? Put the needle into the vein.  ?? Attach a tube to the needle to fill it with blood.  ?? Remove the band from your arm when enough blood is collected.  ?? Put a gauze pad or cotton ball over the needle site as the needle is removed.  ?? Put pressure on the site and then put on a bandage.  If this blood test is done on a baby, a heel stick may be done instead of a blood draw from a vein.  What happens after the test?  ?? You will probably be able to go home right away.  ?? You can go back to your usual activities right away.  Follow-up care is a key part of your treatment and safety. Be sure to make and go to all appointments, and call your doctor if you are having problems. It's also a good idea to keep a list of the medicines you take. Ask your doctor when you can expect to have your test results.  Where can you learn more?  Go to http://www.healthwise.net/[employer].   Enter Q692 in the search box to learn more about "Complete Blood Count (CBC): About This Test."  Current as of: September 22, 2017  Content Version: 12.1  ?? 2006-2019 [employer]. Care instructions adapted under license by [employer] (which disclaims liability or warranty for this information). If you have questions about a medical condition or this instruction, always ask your healthcare professional. [employer] disclaims any warranty or liability for your use of this information.

## 2018-01-19 NOTE — Progress Notes (Signed)
Result reviewed and noted, will continue to monitor.

## 2018-01-19 NOTE — Progress Notes (Signed)
Hematology/Oncology  Progress Note    Name: William Mcewen Sr.  Date: 01/19/2018  DOB: Jul 28, 1945    PCP: [name], MD     William Blanchard is a 72 y.o.-year-old man who has myelodysplastic syndrome, chronic leukopenia, and severe anemia.  Current therapy: Procrit 60,000 units every 2 weeks when the hemoglobin is below 10 g/dL.    Subjective:     William Blanchard is a 72 year old man who has myelodysplastic syndrome with severe anemia.  He also has chronic leukopenia and thrombocytopenia.  He is currently receiving Procrit 60,000 units every 2 weeks. His wife reports he is eating well and sleeping better. He denies tiredness, weakness, and fatigue. He denies shortness of breath, chest pain, and dizziness. He does not have any concerns or complaints to report at this time.  Since his last clinic visit he received 4 doses of intravenous Venofer after he was found to have a ferritin level of 8 ng/mL.  The iron therapy was tolerated well. Patient is here for follow up. His wife is with him today. Wife said that patient is always tired and sleepy. She denies patient having fever, night sweat, skin rashes and recent infection.  He has no other physical complaints to report at this time.      Past medical history, family history, and social history: these were reviewed and remains unchanged.    Past Medical History:   Diagnosis Date   ??? Anemia    ??? Anxiety    ??? Chronic pain    ??? Cirrhosis of liver ([place])    ??? Diabetes ([place])    ??? GERD (gastroesophageal reflux disease)    ??? Hyperlipemia    ??? Hypertension    ??? Hypotension    ??? Migraine    ??? Other cirrhosis of liver ([place])    ??? Stroke [facility])     had 3 strokes    ??? Thrombocytopenia ([place])      Past Surgical History:   Procedure Laterality Date   ??? HX CHOLECYSTECTOMY     ??? HX ORTHOPAEDIC      R hand sx   ??? HX OTHER SURGICAL      Hand surgery- Nail gun went through finger     Social History     Socioeconomic History   ??? Marital status: MARRIED     Spouse name: Not on file    ??? Number of children: Not on file   ??? Years of education: Not on file   ??? Highest education level: Not on file   Occupational History   ??? Not on file   Social Needs   ??? Financial resource strain: Not on file   ??? Food insecurity:     Worry: Not on file     Inability: Not on file   ??? Transportation needs:     Medical: Not on file     Non-medical: Not on file   Tobacco Use   ??? Smoking status: Former Smoker   ??? Smokeless tobacco: Never Used   ??? Tobacco comment: quit years ago   Substance and Sexual Activity   ??? Alcohol use: No   ??? Drug use: No   ??? Sexual activity: Never     Partners: Male   Lifestyle   ??? Physical activity:     Days per week: Not on file     Minutes per session: Not on file   ??? Stress: Not on file   Relationships   ??? Social  connections:     Talks on phone: Not on file     Gets together: Not on file     Attends religious service: Not on file     Active member of club or organization: Not on file     Attends meetings of clubs or organizations: Not on file     Relationship status: Not on file   ??? Intimate partner violence:     Fear of current or ex partner: Not on file     Emotionally abused: Not on file     Physically abused: Not on file     Forced sexual activity: Not on file   Other Topics Concern   ??? Not on file   Social History Narrative    ** Merged History Encounter **          Family History   Problem Relation Age of Onset   ??? Alzheimer Mother    ??? Diabetes Mother    ??? Cancer Mother         colon cancer    ??? Hypertension Father    ??? Heart Disease Father    ??? Cancer Father         prostate, lung cancer    ??? Diabetes Sister    ??? Heart Disease Sister    ??? Diabetes Brother      Current Outpatient Medications   Medication Sig Dispense Refill   ??? GENERLAC 10 gram/15 mL solution TAKE 30ML BY MOUTH TWICE DAILY 5676 mL 0   ??? pramipexole (MIRAPEX) 0.125 mg tablet TAKE 1 TABLET BY MOUTH NIGHTLY 30 Tab 0   ??? propranolol (INDERAL) 20 mg tablet TAKE 1 TABLET BY MOUTH THREE TIMES DAILY 270 Tab 0    ??? sucralfate (CARAFATE) 1 gram tablet TAKE 1 TABLET BY MOUTH FOUR TIMES DAILY WITH MEALS AND AT BEDTIME 360 Tab 0   ??? finasteride (PROSCAR) 5 mg tablet TAKE 1 TABLET BY MOUTH DAILY 90 Tab 0   ??? topiramate (TOPAMAX) 100 mg tablet TAKE 1 TABLET BY MOUTH DAILY 90 Tab 0   ??? citalopram (CELEXA) 40 mg tablet TAKE 1 TABLET BY MOUTH DAILY 90 Tab 0   ??? LORazepam (ATIVAN) 1 mg tablet Take 1 Tab by mouth every eight (8) hours as needed for Anxiety for up to 90 days. Max Daily Amount: 3 mg. 90 Tab 0   ??? nortriptyline (PAMELOR) 10 mg capsule Take 10 mg by mouth nightly.     ??? metFORMIN (GLUCOPHAGE) 500 mg tablet TAKE 2 TABLETS BY MOUTH TWICE DAILY WITH MEALS(GENERIC FOR GLUCOPHAGE) 360 Tab 0   ??? rosuvastatin (CRESTOR) 10 mg tablet TAKE 1 TABLET BY MOUTH NIGHTLY 90 Tab 0   ??? lancets (ONETOUCH ULTRASOFT LANCETS) misc Use to test blood sugars four time daily. 1 Each 11   ??? glucose blood VI test strips (ASCENSIA AUTODISC VI, ONE TOUCH ULTRA TEST VI) strip Use to test blood sugars four times daily with Onetouch ultra 2. 400 Strip 3   ??? Insulin Needles, Disposable, (BD ULTRA-FINE SHORT PEN NEEDLE) 31 gauge x 5/16" ndle Use 4 times daily to inject insulin. 1 Package 11   ??? oxyCODONE IR (ROXICODONE) 10 mg tab immediate release tablet Take 10 mg by mouth every six (6) hours as needed for Pain.     ??? butalbital-acetaminophen-caffeine (FIORICET, ESGIC) 50-325-40 mg per tablet Take 1 Tab by mouth every six (6) hours as needed for Pain. 20 Tab 1   ??? HUMULIN R REGULAR U-100 INSULN  100 unit/mL injection As needed per sliding scale 1 Vial 6   ??? Insulin Syringe-Needle U-100 1 mL 29 gauge x 1/2" syrg Three times per day and as needed as per sliding scale 100 Syringe 6   ??? prochlorperazine (COMPAZINE) 10 mg tablet Take 0.5 Tabs by mouth every six (6) hours as needed. 30 Tab 5   ??? gemfibrozil (LOPID) 600 mg tablet TAKE 1 TABLET BY MOUTH TWICE DAILY 180 Tab 3   ??? ketoconazole (NIZORAL) 2 % topical cream Apply  to affected area daily. 15 g 0    ??? metaxalone (SKELAXIN) 800 mg tablet Take 800 mg by mouth three (3) times daily as needed.     ??? augmented betamethasone dipropionate (DIPROLENE-AF) 0.05 % ointment Apply  to affected area two (2) times a day.     ??? naloxone (NARCAN) 4 mg/actuation nasal spray Use 1 spray intranasally into 1 nostril. Use a new Narcan nasal spray for subsequent doses and administer into alternating nostrils. May repeat every 2 to 3 minutes as needed for opioid overdose symptoms. 1 Each 0   ??? aspirin 81 mg chewable tablet Take 81 mg by mouth daily.     ??? cholecalciferol, vitamin D3, (VITAMIN D3) 2,000 unit tab Take  by mouth.     ??? B.infantis-B.ani-B.long-B.bifi (PROBIOTIC 4X) 10-15 mg TbEC Take  by mouth.     ??? cyclobenzaprine (FLEXERIL) 10 mg tablet Take  by mouth three (3) times daily as needed for Muscle Spasm(s).         Review of Systems  Constitutional: The patient has no acute distress or discomfort.  HEENT: The patient denies recent head trauma, eye pain, blurred vision,  hearing deficit, oropharyngeal mucosal pain or lesions, and the patient denies throat pain or discomfort.  Lymphatics: The patient denies palpable peripheral lymphadenopathy.  Hematologic: The patient denies having bruising, bleeding, or progressive fatigue.  Respiratory: Patient denies having shortness of breath, cough, sputum production, fever, or dyspnea on exertion.  Cardiovascular: The patient denies having leg pain, leg swelling, heart palpitations, chest permit, chest pain, or lightheadedness.  The patient denies having dyspnea on exertion.  Gastrointestinal: The patient denies having nausea, emesis, or diarrhea. The patient denies having any hematemesis or blood in the stool.  Genitourinary: Patient denies having urinary urgency, frequency, or dysuria.  The patient denies having blood in the urine.  Psychological: The patient denies having symptoms of nervousness, anxiety, depression, or thoughts of harming self.   Skin: Patient denies having skin rashes, skin, ulcerations, or unexplained itching or pruritus.  Musculoskeletal: The patient denies having pain in the joints or bones.      Objective:     Visit Vitals  BP 105/63   Pulse 65   Temp 97.4 ??F (36.3 ??C) (Oral)   Resp 18   Ht 5\' 9"  (1.753 m)   Wt 59 kg (130 lb)   SpO2 99%   BMI 19.20 kg/m??     ECOG PS=0  Physical Exam:   Gen. Appearance: The patient is in no acute distress.  Skin: There is no bruise or rash.  HEENT: The exam is unremarkable.  Neck: Supple without lymphadenopathy or thyromegaly.  Lungs: Clear to auscultation and percussion; there are no wheezes or rhonchi.  Heart: Regular rate and rhythm; there are no murmurs, gallops, or rubs.  Abdomen: Bowel sounds are present and normal.  There is no guarding, tenderness, or hepatosplenomegaly.  Extremities: There is no clubbing, cyanosis, or edema.  Neurologic: There are no focal  neurologic deficits.  Lymphatics: There is no palpable peripheral lymphadenopathy. Musculoskeletal: The patient has full range of motion at all joints.  There is no evidence of joint deformity or effusions.  There is no focal joint tenderness.  Psychological/psychiatric: There is no clinical evidence of anxiety, depression, or melancholy.    Lab data:      Results for orders placed or performed during the hospital encounter of 01/18/18   CBC WITH 3 PART DIFF     Status: Abnormal   Result Value Ref Range Status    WBC 3.7 (L) 4.5 - 13.0 K/uL Final    RBC 3.71 (L) 4.10 - 5.10 M/uL Final    HGB 11.3 (L) 12.0 - 16.0 g/dL Final    HCT 34.2 (L) 36 - 48 % Final    MCV 92.2 78 - 102 FL Final    MCH 30.5 25.0 - 35.0 PG Final    MCHC 33.0 31 - 37 g/dL Final    RDW 14.5 11.5 - 14.5 % Final    PLATELET 80 (L) 140 - 440 K/uL Final    NEUTROPHILS 79 (H) 40 - 70 % Final    MIXED CELLS 10 0.1 - 17 % Final    LYMPHOCYTES 12 (L) 14 - 44 % Final    ABS. NEUTROPHILS 2.9 1.8 - 9.5 K/UL Final    ABS. MIXED CELLS 0.4 0.0 - 2.3 K/uL Final     ABS. LYMPHOCYTES 0.4 (L) 1.1 - 5.9 K/UL Final     Comment: Test performed at [place] or [facility] Location. Reviewed by Medical Director.    DF AUTOMATED   Final           Assessment:     1. Thrombocytopenia ([place])    2. Iron deficiency anemia secondary to inadequate dietary iron intake    3. Myelodysplastic syndrome ([place])    4. 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 Chronic leukopenia      Plan:     Thrombocytopenia: The patient has a stable platelet count of 80,000 K/uL. No therapeutic intervention is warranted.    Chronic leukopenia: The CBC from today shows that his WBC count is 3.7K/uL with an absolute neutrophil count which is normal at 2.1. However the absolute lymphocyte count is low at 0.4 K/UL. We will continue to monitor every 2 months. Therapeutic intervention is not warranted. Infection prevention was discussed with patient and his wife.      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  This is a significant improvement after receiving intravenous iron for 4 doses in the form of Venofer.  I will check his iron profile and ferritin levels at this time along with comprehensive metabolic panel. If his ferritin level is below 25 we will need to give him intravenous iron to replenish his ferritin level. Patient is currently being monitored for Procrit injection every 2 weeks. Patient said that he has not had Procrit injection since February, 2019, therefore, assessment for Procrit injection was changed from every 2 weeks to every 4 weeks. Patient is aware that if his hemoglobin and hematocrit go below 10 g/dL and 30 % respectively, he will be given Procrit 60,000 units.      Follow-up in 2 months or sooner if indicated.    Orders Placed This Encounter    ???  METABOLIC PANEL, COMPREHENSIVE     Standing Status:   Future     Standing Expiration Date:   01/20/2019   ??? FERRITIN     Standing Status:   Future     Standing Expiration Date:   01/20/2019   ??? IRON PROFILE     Standing Status:   Future     Standing Expiration Date:   01/20/2019   ??? SPEP     Standing Status:   Future     Standing Expiration Date:   01/20/2019         Nicki Guadalajara, NP  01/19/2018       I have assessed the patient independently and  agree with the full assessment as outlined.  Joycelyn Das, MD, FACP                                                                                        Hematology/Oncology  Progress Note    Name: William Vandezande Sr.  Date: 01/19/2018  DOB: [birth year]    PCP: [name], MD     Mr. Raby is a 72 y.o.-year-old man who has myelodysplastic syndrome, chronic leukopenia, and severe anemia.  Current therapy: Procrit 60,000 units every 2 weeks when the hemoglobin is below 10 g/dL.    Subjective:     Mr. Mcwhirter is a 72 year old man who has myelodysplastic syndrome with severe anemia.  He also has chronic leukopenia and thrombocytopenia.  He is currently receiving Procrit 60,000 units every 2 weeks.  Today he is complaining of some fatigue and weakness.  His wife reports that although he is eating well he is continuing to lose weight and he is hypersomnolent, sleeping all of the time.    Past medical history, family history, and social history: these were reviewed and remains unchanged.    Past Medical History:   Diagnosis Date   ??? Anemia    ??? Anxiety    ??? Chronic pain    ??? Cirrhosis of liver ([place])    ??? Diabetes ([place])    ??? GERD (gastroesophageal reflux disease)    ??? Hyperlipemia    ??? Hypertension    ??? Hypotension    ??? Migraine    ??? Other cirrhosis of liver ([place])    ??? Stroke [facility])     had 3 strokes    ??? Thrombocytopenia ([place])      Past Surgical History:   Procedure Laterality Date   ??? HX CHOLECYSTECTOMY     ??? HX ORTHOPAEDIC      R hand sx   ??? HX OTHER SURGICAL       Hand surgery- Nail gun went through finger     Social History     Socioeconomic History   ??? Marital status: MARRIED     Spouse name: Not on file   ??? Number of children: Not on file   ??? Years of education: Not on file   ??? Highest education level: Not on file   Occupational History   ??? Not on file   Social Needs   ??? Financial resource strain: Not on file   ???  Food insecurity:     Worry: Not on file     Inability: Not on file   ??? Transportation needs:     Medical: Not on file     Non-medical: Not on file   Tobacco Use   ??? Smoking status: Former Smoker   ??? Smokeless tobacco: Never Used   ??? Tobacco comment: quit years ago   Substance and Sexual Activity   ??? Alcohol use: No   ??? Drug use: No   ??? Sexual activity: Never     Partners: Male   Lifestyle   ??? Physical activity:     Days per week: Not on file     Minutes per session: Not on file   ??? Stress: Not on file   Relationships   ??? Social connections:     Talks on phone: Not on file     Gets together: Not on file     Attends religious service: Not on file     Active member of club or organization: Not on file     Attends meetings of clubs or organizations: Not on file     Relationship status: Not on file   ??? Intimate partner violence:     Fear of current or ex partner: Not on file     Emotionally abused: Not on file     Physically abused: Not on file     Forced sexual activity: Not on file   Other Topics Concern   ??? Not on file   Social History Narrative    ** Merged History Encounter **          Family History   Problem Relation Age of Onset   ??? Alzheimer Mother    ??? Diabetes Mother    ??? Cancer Mother         colon cancer    ??? Hypertension Father    ??? Heart Disease Father    ??? Cancer Father         prostate, lung cancer    ??? Diabetes Sister    ??? Heart Disease Sister    ??? Diabetes Brother      Current Outpatient Medications   Medication Sig Dispense Refill   ??? GENERLAC 10 gram/15 mL solution TAKE 30ML BY MOUTH TWICE DAILY 5676 mL 0    ??? pramipexole (MIRAPEX) 0.125 mg tablet TAKE 1 TABLET BY MOUTH NIGHTLY 30 Tab 0   ??? propranolol (INDERAL) 20 mg tablet TAKE 1 TABLET BY MOUTH THREE TIMES DAILY 270 Tab 0   ??? sucralfate (CARAFATE) 1 gram tablet TAKE 1 TABLET BY MOUTH FOUR TIMES DAILY WITH MEALS AND AT BEDTIME 360 Tab 0   ??? finasteride (PROSCAR) 5 mg tablet TAKE 1 TABLET BY MOUTH DAILY 90 Tab 0   ??? topiramate (TOPAMAX) 100 mg tablet TAKE 1 TABLET BY MOUTH DAILY 90 Tab 0   ??? citalopram (CELEXA) 40 mg tablet TAKE 1 TABLET BY MOUTH DAILY 90 Tab 0   ??? LORazepam (ATIVAN) 1 mg tablet Take 1 Tab by mouth every eight (8) hours as needed for Anxiety for up to 90 days. Max Daily Amount: 3 mg. 90 Tab 0   ??? nortriptyline (PAMELOR) 10 mg capsule Take 10 mg by mouth nightly.     ??? metFORMIN (GLUCOPHAGE) 500 mg tablet TAKE 2 TABLETS BY MOUTH TWICE DAILY WITH MEALS(GENERIC FOR GLUCOPHAGE) 360 Tab 0   ??? rosuvastatin (CRESTOR) 10 mg tablet TAKE 1 TABLET BY MOUTH NIGHTLY 90 Tab 0   ???  lancets (ONETOUCH ULTRASOFT LANCETS) misc Use to test blood sugars four time daily. 1 Each 11   ??? glucose blood VI test strips (ASCENSIA AUTODISC VI, ONE TOUCH ULTRA TEST VI) strip Use to test blood sugars four times daily with Onetouch ultra 2. 400 Strip 3   ??? Insulin Needles, Disposable, (BD ULTRA-FINE SHORT PEN NEEDLE) 31 gauge x 5/16" ndle Use 4 times daily to inject insulin. 1 Package 11   ??? oxyCODONE IR (ROXICODONE) 10 mg tab immediate release tablet Take 10 mg by mouth every six (6) hours as needed for Pain.     ??? butalbital-acetaminophen-caffeine (FIORICET, ESGIC) 50-325-40 mg per tablet Take 1 Tab by mouth every six (6) hours as needed for Pain. 20 Tab 1   ??? HUMULIN R REGULAR U-100 INSULN 100 unit/mL injection As needed per sliding scale 1 Vial 6   ??? Insulin Syringe-Needle U-100 1 mL 29 gauge x 1/2" syrg Three times per day and as needed as per sliding scale 100 Syringe 6   ??? prochlorperazine (COMPAZINE) 10 mg tablet Take 0.5 Tabs by mouth every six (6) hours as needed. 30 Tab 5    ??? gemfibrozil (LOPID) 600 mg tablet TAKE 1 TABLET BY MOUTH TWICE DAILY 180 Tab 3   ??? ketoconazole (NIZORAL) 2 % topical cream Apply  to affected area daily. 15 g 0   ??? metaxalone (SKELAXIN) 800 mg tablet Take 800 mg by mouth three (3) times daily as needed.     ??? augmented betamethasone dipropionate (DIPROLENE-AF) 0.05 % ointment Apply  to affected area two (2) times a day.     ??? naloxone (NARCAN) 4 mg/actuation nasal spray Use 1 spray intranasally into 1 nostril. Use a new Narcan nasal spray for subsequent doses and administer into alternating nostrils. May repeat every 2 to 3 minutes as needed for opioid overdose symptoms. 1 Each 0   ??? aspirin 81 mg chewable tablet Take 81 mg by mouth daily.     ??? cholecalciferol, vitamin D3, (VITAMIN D3) 2,000 unit tab Take  by mouth.     ??? B.infantis-B.ani-B.long-B.bifi (PROBIOTIC 4X) 10-15 mg TbEC Take  by mouth.     ??? cyclobenzaprine (FLEXERIL) 10 mg tablet Take  by mouth three (3) times daily as needed for Muscle Spasm(s).         Review of Systems  Constitutional: The patient has no acute distress or discomfort.  HEENT: The patient denies recent head trauma, eye pain, blurred vision,  hearing deficit, oropharyngeal mucosal pain or lesions, and the patient denies throat pain or discomfort.  Lymphatics: The patient denies palpable peripheral lymphadenopathy.  Hematologic: The patient denies having bruising, bleeding, or progressive fatigue.  Respiratory: Patient denies having shortness of breath, cough, sputum production, fever, or dyspnea on exertion.  Cardiovascular: The patient denies having leg pain, leg swelling, heart palpitations, chest permit, chest pain, or lightheadedness.  The patient denies having dyspnea on exertion.  Gastrointestinal: The patient denies having nausea, emesis, or diarrhea. The patient denies having any hematemesis or blood in the stool.  Genitourinary: Patient denies having urinary urgency, frequency, or  dysuria.  The patient denies having blood in the urine.  Psychological: The patient denies having symptoms of nervousness, anxiety, depression, or thoughts of harming self.  Skin: Patient denies having skin rashes, skin, ulcerations, or unexplained itching or pruritus.  Musculoskeletal: The patient denies having pain in the joints or bones.      Objective:     Visit Vitals  BP 105/63  Pulse 65   Temp 97.4 ??F (36.3 ??C) (Oral)   Resp 18   Ht 5\' 9"  (1.753 m)   Wt 59 kg (130 lb)   SpO2 99%   BMI 19.20 kg/m??     ECOG PS=0  Physical Exam:   Gen. Appearance: The patient is in no acute distress.  Skin: There is no bruise or rash.  HEENT: The exam is unremarkable.  Neck: Supple without lymphadenopathy or thyromegaly.  Lungs: Clear to auscultation and percussion; there are no wheezes or rhonchi.  Heart: Regular rate and rhythm; there are no murmurs, gallops, or rubs.  Abdomen: Bowel sounds are present and normal.  There is no guarding, tenderness, or hepatosplenomegaly.  Extremities: There is no clubbing, cyanosis, or edema.  Neurologic: There are no focal neurologic deficits.  Lymphatics: There is no palpable peripheral lymphadenopathy. Musculoskeletal: The patient has full range of motion at all joints.  There is no evidence of joint deformity or effusions.  There is no focal joint tenderness.  Psychological/psychiatric: There is no clinical evidence of anxiety, depression, or melancholy.    Lab data:      Results for orders placed or performed during the hospital encounter of 01/18/18   CBC WITH 3 PART DIFF     Status: Abnormal   Result Value Ref Range Status    WBC 3.7 (L) 4.5 - 13.0 K/uL Final    RBC 3.71 (L) 4.10 - 5.10 M/uL Final    HGB 11.3 (L) 12.0 - 16.0 g/dL Final    HCT 34.2 (L) 36 - 48 % Final    MCV 92.2 78 - 102 FL Final    MCH 30.5 25.0 - 35.0 PG Final    MCHC 33.0 31 - 37 g/dL Final    RDW 14.5 11.5 - 14.5 % Final    PLATELET 80 (L) 140 - 440 K/uL Final    NEUTROPHILS 79 (H) 40 - 70 % Final     MIXED CELLS 10 0.1 - 17 % Final    LYMPHOCYTES 12 (L) 14 - 44 % Final    ABS. NEUTROPHILS 2.9 1.8 - 9.5 K/UL Final    ABS. MIXED CELLS 0.4 0.0 - 2.3 K/uL Final    ABS. LYMPHOCYTES 0.4 (L) 1.1 - 5.9 K/UL Final     Comment: Test performed at [place] or [facility]patient [facility]fusion [facility] Location. Reviewed by Medical Director.    DF AUTOMATED   Final           Assessment:     1. Thrombocytopenia ([place])    2. Iron deficiency anemia secondary to inadequate dietary iron intake    3. Myelodysplastic syndrome ([place])    4. 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  He did receive Procrit at a dose of 60,000 units yesterday.  I will check his iron profile and ferritin levels at this time.  If his ferritin level is below 100 and we will need to give him intravenous iron to replenish his ferritin level.    Chronic leukopenia: The CBC from today shows that his WBC count is 3.3 with an absolute neutrophil count which is normal at 2.6.  However the absolute lymphocyte count is low at 0.5.  These will continue  to be monitored.  Therapeutic intervention is not warranted.    Thrombocytopenia: The patient has a stable lytic count of 85,000.  No therapeutic intervention is warranted.    Follow-up in 2 months  Orders Placed This Encounter   ??? METABOLIC PANEL, COMPREHENSIVE     Standing Status:   Future     Standing Expiration Date:   01/20/2019   ??? FERRITIN     Standing Status:   Future     Standing Expiration Date:   01/20/2019   ??? IRON PROFILE     Standing Status:   Future     Standing Expiration Date:   01/20/2019   ??? SPEP     Standing Status:   Future     Standing Expiration Date:   01/20/2019       Nicki Guadalajara, NP  01/19/2018       Please note: This document has been produced using voice recognition software.  Unrecognized errors in transcription may be present.

## 2018-01-20 LAB — METABOLIC PANEL, COMPREHENSIVE
A-G Ratio: 1.4 (ref 0.8–1.7)
ALT (SGPT): 27 U/L (ref 16–61)
AST (SGOT): 27 U/L (ref 10–38)
Albumin: 4.6 g/dL (ref 3.4–5.0)
Alk. phosphatase: 165 U/L — ABNORMAL HIGH (ref 45–117)
Anion gap: 9 mmol/L (ref 3.0–18)
BUN/Creatinine ratio: 16 (ref 12–20)
BUN: 17 MG/DL (ref 7.0–18)
Bilirubin, total: 0.5 MG/DL (ref 0.2–1.0)
CO2: 23 mmol/L (ref 21–32)
Calcium: 9.9 MG/DL (ref 8.5–10.1)
Chloride: 107 mmol/L (ref 100–111)
Creatinine: 1.04 MG/DL (ref 0.6–1.3)
GFR est AA: >60 mL/min/{1.73_m2} (ref >60)
GFR est non-AA: >60 mL/min/{1.73_m2} (ref >60)
Globulin: 3.2 g/dL (ref 2.0–4.0)
Glucose: 106 mg/dL — ABNORMAL HIGH (ref 74–99)
Potassium: 4.5 mmol/L (ref 3.5–5.5)
Protein, total: 7.8 g/dL (ref 6.4–8.2)
Sodium: 139 mmol/L (ref 136–145)

## 2018-01-20 LAB — FERRITIN: Ferritin: 18 NG/ML (ref 8–388)

## 2018-01-20 LAB — IRON PROFILE
Iron % saturation: 10 %
Iron: 47 ug/dL — ABNORMAL LOW (ref 50–175)
TIBC: 455 ug/dL — ABNORMAL HIGH (ref 250–450)

## 2018-01-20 NOTE — Progress Notes (Signed)
Results reviewed and noted, will continue to monitor.

## 2018-01-23 LAB — PROTEIN ELECTROPHORESIS
A/G ratio: 1.3 (ref 0.7–1.7)
ALPHA-2 GLOBULIN: 0.9 g/dL (ref 0.4–1.0)
Albumin: 4.2 g/dL (ref 2.9–4.4)
Alpha-1-globulin: 0.3 g/dL (ref 0.0–0.4)
Beta globulin: 1.2 g/dL (ref 0.7–1.3)
Gamma globulin: 0.9 g/dL (ref 0.4–1.8)
Globulin, total: 3.3 g/dL (ref 2.2–3.9)
Protein, total: 7.5 g/dL (ref 6.0–8.5)

## 2018-01-25 ENCOUNTER — Encounter: Payer: MEDICARE | Primary: Legal Medicine

## 2018-01-25 MED ORDER — ROSUVASTATIN 10 MG TAB
10 mg | ORAL_TABLET | ORAL | 0 refills | Status: DC
Start: 2018-01-25 — End: 2018-04-28

## 2018-01-25 NOTE — Telephone Encounter (Signed)
Completed. I informed the patient's wife that Mr William Blanchard is scheduled for Retacrit and Injectafer on August 7th at 1300.

## 2018-01-25 NOTE — Telephone Encounter (Signed)
In the absence of her PCP I am refilling the Crestor.  Even though she is tolerating Lopid and Crestor, there is a possibility of severe drug interaction between Lopid and Crestor.  It is recommended that people who are taking Crestor should not be on Lopid, if at all possible.

## 2018-01-25 NOTE — Telephone Encounter (Signed)
The patient's wife called, patient was in for an office visit last week and was told someone would give her a call about the patient's iron levels. She is sure he needs an iron infusion. Please call.

## 2018-02-01 ENCOUNTER — Inpatient Hospital Stay: Admit: 2018-02-01 | Payer: MEDICARE | Primary: Legal Medicine

## 2018-02-01 ENCOUNTER — Institutional Professional Consult (permissible substitution): Admit: 2018-02-01 | Discharge: 2018-02-01 | Payer: PRIVATE HEALTH INSURANCE | Primary: Legal Medicine

## 2018-02-01 ENCOUNTER — Inpatient Hospital Stay: Admit: 2018-02-01 | Primary: Legal Medicine

## 2018-02-01 DIAGNOSIS — D649 Anemia, unspecified: Secondary | ICD-10-CM

## 2018-02-01 DIAGNOSIS — D508 Other iron deficiency anemias: Secondary | ICD-10-CM

## 2018-02-01 LAB — CBC WITH 3 PART DIFF
ABS. LYMPHOCYTES: 0.4 10*3/uL — ABNORMAL LOW (ref 1.1–5.9)
ABS. MIXED CELLS: 0.3 10*3/uL (ref 0.0–2.3)
ABS. NEUTROPHILS: 2.7 10*3/uL (ref 1.8–9.5)
HCT: 33.7 % — ABNORMAL LOW (ref 36–48)
HGB: 10.9 g/dL — ABNORMAL LOW (ref 12.0–16.0)
LYMPHOCYTES: 13 % — ABNORMAL LOW (ref 14–44)
MCH: 29.8 PG (ref 25.0–35.0)
MCHC: 32.3 g/dL (ref 31–37)
MCV: 92.1 FL (ref 78–102)
Mixed cells: 10 % (ref 0.1–17)
NEUTROPHILS: 77 % — ABNORMAL HIGH (ref 40–70)
PLATELET: 80 10*3/uL — ABNORMAL LOW (ref 140–440)
RBC: 3.66 M/uL — ABNORMAL LOW (ref 4.10–5.10)
RDW: 14.2 % (ref 11.5–14.5)
WBC: 3.4 10*3/uL — ABNORMAL LOW (ref 4.5–13.0)

## 2018-02-01 MED ORDER — FERRIC CARBOXYMALTOSE 50 MG IRON/ML INTRAVENOUS SOLUTION
50 mg iron/mL | Freq: Once | INTRAVENOUS | Status: AC
Start: 2018-02-01 — End: 2018-02-01
  Administered 2018-02-01: 18:00:00 via INTRAVENOUS

## 2018-02-01 MED ORDER — SODIUM CHLORIDE 0.9 % IJ SYRG
INTRAMUSCULAR | Status: DC | PRN
Start: 2018-02-01 — End: 2018-02-05
  Administered 2018-02-01 (×2): via INTRAVENOUS

## 2018-02-01 MED FILL — BD POSIFLUSH NORMAL SALINE 0.9 % INJECTION SYRINGE: INTRAMUSCULAR | Qty: 40

## 2018-02-01 MED FILL — INJECTAFER 50 MG IRON/ML INTRAVENOUS SOLUTION: 50 mg iron/mL | INTRAVENOUS | Qty: 15

## 2018-02-01 NOTE — Progress Notes (Signed)
[facility] OPIC Progress Note    Date: February 01, 2018    Name: Jaheim Canino [facility] Sr.    MRN: [id number]         DOB: July 22, 1945      Mr. Blackley was assessed and education was provided.     Mr. Piacentini vitals were reviewed and patient was observed for 5 minutes prior to treatment.   Visit Vitals  BP 102/57 (BP 1 Location: Left arm, BP Patient Position: At rest;Sitting)   Pulse 70   Temp 98.2 ??F (36.8 ??C)   Resp 16   SpO2 100%       Lab results were obtained and reviewed.  Recent Results (from the past 12 hour(s))   CBC WITH 3 PART DIFF    Collection Time: 02/01/18  1:21 PM   Result Value Ref Range    WBC 3.4 (L) 4.5 - 13.0 K/uL    RBC 3.66 (L) 4.10 - 5.10 M/uL    HGB 10.9 (L) 12.0 - 16.0 g/dL    HCT 33.7 (L) 36 - 48 %    MCV 92.1 78 - 102 FL    MCH 29.8 25.0 - 35.0 PG    MCHC 32.3 31 - 37 g/dL    RDW 14.2 11.5 - 14.5 %    PLATELET 80 (L) 140 - 440 K/uL    NEUTROPHILS 77 (H) 40 - 70 %    MIXED CELLS 10 0.1 - 17 %    LYMPHOCYTES 13 (L) 14 - 44 %    ABS. NEUTROPHILS 2.7 1.8 - 9.5 K/UL    ABS. MIXED CELLS 0.3 0.0 - 2.3 K/uL    ABS. LYMPHOCYTES 0.4 (L) 1.1 - 5.9 K/UL    DF AUTOMATED       Retacrit not given for H&H 10.9/33.7.  Injectafer 750 mg IVP given over 9 min.    Mr Sackmann stayed for 30 min observation after injectafer.     Patient armband removed and shredded.    Mr. Mcwherter was discharged from [place] in stable condition at 1415. He is to return on 02/08/17 at 0900 for his next appointment for [facility] #2 of 2.    Lynnda Shields, RN  February 01, 2018

## 2018-02-08 ENCOUNTER — Inpatient Hospital Stay: Admit: 2018-02-08 | Payer: MEDICARE | Primary: Legal Medicine

## 2018-02-08 ENCOUNTER — Encounter: Payer: MEDICARE | Primary: Legal Medicine

## 2018-02-08 MED ORDER — FERRIC CARBOXYMALTOSE 50 MG IRON/ML INTRAVENOUS SOLUTION
50 mg iron/mL | Freq: Once | INTRAVENOUS | Status: AC
Start: 2018-02-08 — End: 2018-02-08
  Administered 2018-02-08: 13:00:00 via INTRAVENOUS

## 2018-02-08 MED ORDER — SODIUM CHLORIDE 0.9 % IJ SYRG
INTRAMUSCULAR | Status: DC | PRN
Start: 2018-02-08 — End: 2018-02-12
  Administered 2018-02-08 (×2): via INTRAVENOUS

## 2018-02-08 MED FILL — INJECTAFER 50 MG IRON/ML INTRAVENOUS SOLUTION: 50 mg iron/mL | INTRAVENOUS | Qty: 15

## 2018-02-08 MED FILL — BD POSIFLUSH NORMAL SALINE 0.9 % INJECTION SYRINGE: INTRAMUSCULAR | Qty: 40

## 2018-02-08 NOTE — Progress Notes (Signed)
[facility] OPIC Progress Note    Date: February 08, 2018    Name: William Henner [facility] Sr.    MRN: [id number]         DOB: 05-09-1946      [name] was assessed and education was provided.     [name] vitals were reviewed and patient was observed for 5 minutes prior to treatment.   Visit Vitals  BP 104/61 (BP 1 Location: Left arm, BP Patient Position: At rest)   Pulse 62   Temp 98 ??F (36.7 ??C)   Resp 18   SpO2 98%       Injectafer 750 mg IVP given over 9 min.    [name] stayed for 30 min observation after injectafer.     Patient armband removed and shredded.    [name] was discharged from [facility] in stable condition at 1000. He is to return on 03/01/18 at 1400 for his next appointment for Retacrit q4w.    Denyse Amass, RN  February 08, 2018   [phone]

## 2018-02-13 ENCOUNTER — Encounter

## 2018-02-14 MED ORDER — METFORMIN 500 MG TAB
500 mg | ORAL_TABLET | ORAL | 0 refills | Status: DC
Start: 2018-02-14 — End: 2018-08-14

## 2018-02-15 ENCOUNTER — Encounter: Payer: MEDICARE | Primary: Legal Medicine

## 2018-02-20 ENCOUNTER — Encounter: Attending: Legal Medicine | Primary: Legal Medicine

## 2018-02-21 ENCOUNTER — Ambulatory Visit
Admit: 2018-02-21 | Discharge: 2018-02-21 | Payer: PRIVATE HEALTH INSURANCE | Attending: Legal Medicine | Primary: Legal Medicine

## 2018-02-21 DIAGNOSIS — J3089 Other allergic rhinitis: Secondary | ICD-10-CM

## 2018-02-21 MED ORDER — LORAZEPAM 1 MG TAB
1 mg | ORAL_TABLET | Freq: Three times a day (TID) | ORAL | 0 refills | Status: DC | PRN
Start: 2018-02-21 — End: 2018-05-16

## 2018-02-21 NOTE — Progress Notes (Signed)
No significant abnormality     Diabetes is well controlled   To decrease metformin  500 mg to one tablet daily

## 2018-02-21 NOTE — Progress Notes (Signed)
Chief Complaint   Patient presents with   ??? Diabetes     f/u   ??? Foot Pain     bilateral foot pain for several months   ??? Hospital Follow Up     02/15/2018     1. Have you been to the ER, urgent care clinic since your last visit?  Hospitalized since your last visit?Yes When: 02/15/2018 Where [facility]    2. Have you seen or consulted any other health care providers outside of the [place] since your last visit?  Include any pap smears or colon screening. No

## 2018-02-21 NOTE — Progress Notes (Signed)
William Blalock Stopher Sr.     Chief Complaint   Patient presents with   ??? Diabetes     f/u   ??? Foot Pain     bilateral foot pain for several months   ??? Hospital Follow Up     02/15/2018     Vitals:    02/21/18 1428   BP: 112/68   Pulse: 70   Resp: 16   Temp: 98.2 ??F (36.8 ??C)   TempSrc: Oral   SpO2: 100%   Weight: 129 lb (58.5 kg)   Height: '5\' 9"'$  (1.753 m)   PainSc:   7   PainLoc: Back         HPI: Patient is here for follow-up, he needs blood work for cholesterol and hemoglobin A1c.      Also patient needs refill on Ativan, now he has decrease his Ativan intake he takes it only once a day 90 tablets will last him 3 months I encouraged the patient to taper off his Ativan intake further, by reducing the dose.    Patient was admitted to the hospital on 8/21 for lower GI bleed, patient was discharged to follow-up with his GI     Patient is a upper and lower endoscopy    Multiple diverticula were found in the sigmoid colon and descending colon. Internal hemorrhoids were found. The hemorrhoids were large.    Post-Operative Diagnosis:       - Diverticulosis in the sigmoid colon and in the descending colon.       - Internal hemorrhoids.       - No specimens collected.    Estimated Blood Loss:       Estimated blood loss: none.    Recommendation:       - Patient has a contact number available for emergencies. The signs and symptoms of potential delayed complications were discussed with the patient. Return to normal activities tomorrow. Written discharge instructions were provided to the patient.       - Resume previous diet.       - Continue present medications.       - Repeat colonoscopy in 10 years for screening purposes.      Normal esophagus. No varices       - Gastritis.       - A single papule (nodule) found in the stomach. Biopsied. Clip was placed.       - Portal hypertensive gastropathy.       - Normal examined duodenum.    Past Medical History:   Diagnosis Date   ??? Anemia    ??? Anxiety    ??? Chronic pain     ??? Cirrhosis of liver ([place])    ??? Diabetes ([place])    ??? GERD (gastroesophageal reflux disease)    ??? Hyperlipemia    ??? Hypertension    ??? Hypotension    ??? Migraine    ??? Other cirrhosis of liver ([place])    ??? Stroke [facility])     had 3 strokes    ??? Thrombocytopenia ([place])      Past Surgical History:   Procedure Laterality Date   ??? HX CHOLECYSTECTOMY     ??? HX ORTHOPAEDIC      R hand sx   ??? HX OTHER SURGICAL      Hand surgery- Nail gun went through finger     Social History     Tobacco Use   ??? Smoking status: Former Smoker   ??? Smokeless  tobacco: Never Used   ??? Tobacco comment: quit years ago   Substance Use Topics   ??? Alcohol use: No       Family History   Problem Relation Age of Onset   ??? Alzheimer Mother    ??? Diabetes Mother    ??? Cancer Mother         colon cancer    ??? Hypertension Father    ??? Heart Disease Father    ??? Cancer Father         prostate, lung cancer    ??? Diabetes Sister    ??? Heart Disease Sister    ??? Diabetes Brother        Review of Systems   Constitutional: Negative for chills, fever, malaise/fatigue and weight loss.   HENT: Negative for congestion, ear discharge, ear pain, hearing loss, nosebleeds and sinus pain.    Eyes: Negative for blurred vision, double vision and discharge.   Respiratory: Negative for cough.    Cardiovascular: Negative for chest pain, palpitations, claudication and leg swelling.   Gastrointestinal: Negative for abdominal pain, constipation, diarrhea, nausea and vomiting.   Genitourinary: Negative for dysuria, frequency and urgency.   Musculoskeletal: Negative for myalgias.   Skin: Negative for itching and rash.   Neurological: Positive for sensory change. Negative for dizziness, tingling, speech change, focal weakness, weakness and headaches.   Psychiatric/Behavioral: Positive for depression. Negative for hallucinations, substance abuse and suicidal ideas. The patient is nervous/anxious.        Physical Exam   Constitutional: He is oriented to person, place, and time. He appears  well-developed and well-nourished. No distress.   HENT:   Head: Normocephalic and atraumatic.   Mouth/Throat: No oropharyngeal exudate.   Eyes: Pupils are equal, round, and reactive to light. Conjunctivae are normal. Right eye exhibits no discharge. Left eye exhibits no discharge. No scleral icterus.   Neck: Normal range of motion. Neck supple. No thyromegaly present.   Cardiovascular: Normal rate, regular rhythm and normal heart sounds.   Pulmonary/Chest: Effort normal and breath sounds normal. No respiratory distress. He has no rales.   Abdominal: Soft. Bowel sounds are normal. He exhibits no distension and no mass. There is no tenderness. There is no rebound.   Musculoskeletal: Normal range of motion. He exhibits no edema, tenderness or deformity.   Lymphadenopathy:     He has no cervical adenopathy.   Neurological: He is alert and oriented to person, place, and time. No cranial nerve deficit. Coordination normal.   Skin: Skin is warm and dry. No rash noted. He is not diaphoretic. No erythema.   Psychiatric: He has a normal mood and affect. Judgment and thought content normal.   Nursing note and vitals reviewed.       Assessment and plan     Plan of care has been discussed with the patient, he agrees to the plan and verbalized understanding.  All his questions were answered  More than 50% of the time spent in this visit was counseling the patient about  illness and treatment options         1. Anxiety    Patient has signed a contract for Ativan he is already following up with pain management for narcotics    Also drug screen be done today    - LORazepam (ATIVAN) 1 mg tablet; Take 1 Tab by mouth every eight (8) hours as needed for Anxiety for up to 90 days. Max Daily Amount: 3 mg.  Dispense: 90 Tab;  Refill: 0    2. Non-seasonal allergic rhinitis, unspecified trigger  Stable     3. Peripheral vascular disease ([place])  Patient has a history of peripheral vascular disease and now complaining  about bilateral feet pain will get arterial Doppler ultrasound  - DUPLEX LOWER EXT ARTERY BILAT; Future    4. Moderate major depression ([place])  Stable   Not suicidal or homicidal  5. Other cirrhosis of liver ([place])  He is following up with gastroenterology  6. Type 2 diabetes mellitus with other specified complication, with long-term current use of insulin (HCC)  Diabetes well controlled last hemoglobin A1c was 5.6  - LIPID PANEL; Future  - HEMOGLOBIN A1C W/O EAG; Future  - MICROALBUMIN, UR, RAND W/ MICROALB/CREAT RATIO; Future  - REFERRAL TO OPHTHALMOLOGY  - REFERRAL TO PODIATRY    7. Pain in both feet    - DUPLEX LOWER EXT ARTERY BILAT; Future    8. Mixed hyperlipidemia    - MICROALBUMIN, UR, RAND W/ MICROALB/CREAT RATIO; Future    Current Outpatient Medications   Medication Sig Dispense Refill   ??? LORazepam (ATIVAN) 1 mg tablet Take 1 Tab by mouth every eight (8) hours as needed for Anxiety for up to 90 days. Max Daily Amount: 3 mg. 90 Tab 0   ??? metFORMIN (GLUCOPHAGE) 500 mg tablet TAKE 2 TABLETS BY MOUTH TWICE DAILY WITH MEALS(GENERIC FOR GLUCOPHAGE) 360 Tab 0   ??? rosuvastatin (CRESTOR) 10 mg tablet TAKE 1 TABLET BY MOUTH NIGHTLY 90 Tab 0   ??? GENERLAC 10 gram/15 mL solution TAKE 30ML BY MOUTH TWICE DAILY 5676 mL 0   ??? pramipexole (MIRAPEX) 0.125 mg tablet TAKE 1 TABLET BY MOUTH NIGHTLY 30 Tab 0   ??? propranolol (INDERAL) 20 mg tablet TAKE 1 TABLET BY MOUTH THREE TIMES DAILY 270 Tab 0   ??? sucralfate (CARAFATE) 1 gram tablet TAKE 1 TABLET BY MOUTH FOUR TIMES DAILY WITH MEALS AND AT BEDTIME 360 Tab 0   ??? finasteride (PROSCAR) 5 mg tablet TAKE 1 TABLET BY MOUTH DAILY 90 Tab 0   ??? topiramate (TOPAMAX) 100 mg tablet TAKE 1 TABLET BY MOUTH DAILY 90 Tab 0   ??? citalopram (CELEXA) 40 mg tablet TAKE 1 TABLET BY MOUTH DAILY 90 Tab 0   ??? nortriptyline (PAMELOR) 10 mg capsule Take 10 mg by mouth nightly.     ??? lancets (ONETOUCH ULTRASOFT LANCETS) misc Use to test blood sugars four time daily. 1 Each 11    ??? glucose blood VI test strips (ASCENSIA AUTODISC VI, ONE TOUCH ULTRA TEST VI) strip Use to test blood sugars four times daily with Onetouch ultra 2. 400 Strip 3   ??? Insulin Needles, Disposable, (BD ULTRA-FINE SHORT PEN NEEDLE) 31 gauge x 5/16" ndle Use 4 times daily to inject insulin. 1 Package 11   ??? oxyCODONE IR (ROXICODONE) 10 mg tab immediate release tablet Take 10 mg by mouth every six (6) hours as needed for Pain.     ??? HUMULIN R REGULAR U-100 INSULN 100 unit/mL injection As needed per sliding scale 1 Vial 6   ??? Insulin Syringe-Needle U-100 1 mL 29 gauge x 1/2" syrg Three times per day and as needed as per sliding scale 100 Syringe 6   ??? prochlorperazine (COMPAZINE) 10 mg tablet Take 0.5 Tabs by mouth every six (6) hours as needed. 30 Tab 5   ??? metaxalone (SKELAXIN) 800 mg tablet Take 800 mg by mouth three (3) times daily as needed.     ???  aspirin 81 mg chewable tablet Take 81 mg by mouth daily.     ??? cholecalciferol, vitamin D3, (VITAMIN D3) 2,000 unit tab Take  by mouth.     ??? B.infantis-B.ani-B.long-B.bifi (PROBIOTIC 4X) 10-15 mg TbEC Take  by mouth.     ??? cyclobenzaprine (FLEXERIL) 10 mg tablet Take  by mouth three (3) times daily as needed for Muscle Spasm(s).     ??? metoclopramide HCl (REGLAN) 10 mg tablet Take 10 mg by mouth two (2) times daily as needed.     ??? naproxen (NAPROSYN) 500 mg tablet Take 500 mg by mouth two (2) times daily as needed.     ??? venlafaxine (EFFEXOR) 75 mg tablet Take 25 mg by mouth daily.     ??? butalbital-acetaminophen-caffeine (FIORICET, ESGIC) 50-325-40 mg per tablet Take 1 Tab by mouth every six (6) hours as needed for Pain. 20 Tab 1   ??? gemfibrozil (LOPID) 600 mg tablet TAKE 1 TABLET BY MOUTH TWICE DAILY 180 Tab 3   ??? ketoconazole (NIZORAL) 2 % topical cream Apply  to affected area daily. 15 g 0   ??? augmented betamethasone dipropionate (DIPROLENE-AF) 0.05 % ointment Apply  to affected area two (2) times a day.      ??? naloxone (NARCAN) 4 mg/actuation nasal spray Use 1 spray intranasally into 1 nostril. Use a new Narcan nasal spray for subsequent doses and administer into alternating nostrils. May repeat every 2 to 3 minutes as needed for opioid overdose symptoms. 1 Each 0       Patient Active Problem List    Diagnosis Date Noted   ??? Peripheral vascular disease ([place]) 02/21/2018   ??? Restless leg syndrome 12/04/2017   ??? Migraine without aura and without status migrainosus, not intractable 04/15/2017   ??? 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 Dermatitis 01/05/2017   ??? Benign prostatic hyperplasia (BPH) with straining on urination 12/31/2016   ??? 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 Myelodysplastic syndrome ([place]) 12/22/2016   ??? Type 2 diabetes with nephropathy ([place]) 10/18/2016   ??? Iron deficiency anemia secondary to inadequate dietary iron intake 09/23/2016   ??? Chronic leukopenia 09/23/2016   ??? Thrombocytopenia ([place]) 09/23/2016   ??? Moderate major depression ([place]) 09/10/2016   ??? Type II diabetes mellitus ([place]) 08/03/2016   ??? HTN (hypertension) 08/03/2016   ??? 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 Stroke ([place]) 08/03/2016   ??? Compressed vertebrae ([place]) 08/03/2016   ??? Chronic back pain greater than 3 months duration 08/03/2016   ??? Hyperlipemia 08/03/2016   ??? Anxiety 08/03/2016   ??? Depression 06/30/2016   ??? Iron deficiency anemia 04/22/2016   ??? NAFLD (nonalcoholic fatty liver disease) 04/22/2016   ??? Other cirrhosis of liver ([place]) 04/22/2016   ??? Chronic anemia 03/18/2016   ??? Controlled type 2 diabetes mellitus without complication, without long-term current use of insulin ([place]) 03/18/2016   ??? Essential hypertension 03/18/2016   ??? Hyperlipidemia 03/18/2016   ??? History of stroke 03/18/2016   ??? Anxiety 03/18/2016   ??? Chronic pain 03/18/2016     Results for orders placed or performed during the hospital encounter of 02/01/18   CBC WITH 3 PART DIFF   Result Value Ref Range    WBC 3.4 (L) 4.5 - 13.0 K/uL     RBC 3.66 (L) 4.10 - 5.10 M/uL    HGB 10.9 (L) 12.0 - 16.0 g/dL    HCT 33.7 (L) 36 - 48 %    MCV 92.1  78 - 102 FL    MCH 29.8 25.0 - 35.0 PG    MCHC 32.3 31 - 37 g/dL    RDW 14.2 11.5 - 14.5 %    PLATELET 80 (L) 140 - 440 K/uL    NEUTROPHILS 77 (H) 40 - 70 %    MIXED CELLS 10 0.1 - 17 %    LYMPHOCYTES 13 (L) 14 - 44 %    ABS. NEUTROPHILS 2.7 1.8 - 9.5 K/UL    ABS. MIXED CELLS 0.3 0.0 - 2.3 K/uL    ABS. LYMPHOCYTES 0.4 (L) 1.1 - 5.9 K/UL    [facility] AUTOMATED       Hospital Outpatient Visit on 02/01/2018   Component Date Value Ref Range Status   ??? WBC 02/01/2018 3.4* 4.5 - 13.0 K/uL Final   ??? RBC 02/01/2018 3.66* 4.10 - 5.10 M/uL Final   ??? HGB 02/01/2018 10.9* 12.0 - 16.0 g/dL Final   ??? HCT 02/01/2018 33.7* 36 - 48 % Final   ??? MCV 02/01/2018 92.1  78 - 102 FL Final   ??? MCH 02/01/2018 29.8  25.0 - 35.0 PG Final   ??? MCHC 02/01/2018 32.3  31 - 37 g/dL Final   ??? RDW 02/01/2018 14.2  11.5 - 14.5 % Final   ??? PLATELET 02/01/2018 80* 140 - 440 K/uL Final   ??? NEUTROPHILS 02/01/2018 77* 40 - 70 % Final   ??? MIXED CELLS 02/01/2018 10  0.1 - 17 % Final   ??? LYMPHOCYTES 02/01/2018 13* 14 - 44 % Final   ??? ABS. NEUTROPHILS 02/01/2018 2.7  1.8 - 9.5 K/UL Final   ??? ABS. MIXED CELLS 02/01/2018 0.3  0.0 - 2.3 K/uL Final   ??? ABS. LYMPHOCYTES 02/01/2018 0.4* 1.1 - 5.9 K/UL Final    Test performed at [place] or Outpatient Infusion Center Location. Reviewed by Medical Director.   ??? DF 02/01/2018 AUTOMATED    Final   Hospital Outpatient Visit on 01/19/2018   Component Date Value Ref Range Status   ??? Sodium 01/19/2018 139  136 - 145 mmol/L Final   ??? Potassium 01/19/2018 4.5  3.5 - 5.5 mmol/L Final   ??? Chloride 01/19/2018 107  100 - 111 mmol/L Final    PLEASE NOTE NEW REFERENCE RANGE   ??? CO2 01/19/2018 23  21 - 32 mmol/L Final   ??? Anion gap 01/19/2018 9  3.0 - 18 mmol/L Final   ??? Glucose 01/19/2018 106* 74 - 99 mg/dL Final   ??? BUN 01/19/2018 17  7.0 - 18 MG/DL Final    ??? Creatinine 01/19/2018 1.04  0.6 - 1.3 MG/DL Final   ??? BUN/Creatinine ratio 01/19/2018 16  12 - 20   Final   ??? GFR est AA 01/19/2018 >60  >60 ml/min/1.77m Final   ??? GFR est non-AA 01/19/2018 >60  >60 ml/min/1.741mFinal    Comment: (NOTE)  Estimated GFR is calculated using the Modification of Diet in Renal   Disease (MDRD) Study equation, reported for both African Americans   (GFRAA) and non-African Americans (GFRNA), and normalized to 1.7326m body surface area. The physician must decide which value applies to   the patient. The MDRD study equation should only be used in   individuals age 38 32 older. It has not been validated for the   following: pregnant women, patients with serious comorbid conditions,   or on certain medications, or persons with extremes of body size,   muscle mass,  or nutritional status.     ??? Calcium 01/19/2018 9.9  8.5 - 10.1 MG/DL Final   ??? Bilirubin, total 01/19/2018 0.5  0.2 - 1.0 MG/DL Final   ??? ALT (SGPT) 01/19/2018 27  16 - 61 U/L Final   ??? AST (SGOT) 01/19/2018 27  10 - 38 U/L Final    PLEASE NOTE NEW REFERENCE RANGE   ??? Alk. phosphatase 01/19/2018 165* 45 - 117 U/L Final   ??? Protein, total 01/19/2018 7.8  6.4 - 8.2 g/dL Final   ??? Albumin 01/19/2018 4.6  3.4 - 5.0 g/dL Final   ??? Globulin 01/19/2018 3.2  2.0 - 4.0 g/dL Final   ??? A-G Ratio 01/19/2018 1.4  0.8 - 1.7   Final   ??? Ferritin 01/19/2018 18  8 - 388 NG/ML Final   ??? Iron 01/19/2018 47* 50 - 175 ug/dL Final    Patients receiving metal-binding drugs (e.g. deferoxamine) may show spuriously depressed iron values, as chelated iron may not properly react in the iron assay.   ??? TIBC 01/19/2018 455* 250 - 450 ug/dL Final   ??? Iron % saturation 01/19/2018 10  % Final   ??? Protein, total 01/19/2018 7.5  6.0 - 8.5 g/dL Final   ??? Albumin 01/19/2018 4.2  2.9 - 4.4 g/dL Final   ??? Alpha-1-globulin 01/19/2018 0.3  0.0 - 0.4 g/dL Final   ??? ALPHA-2 GLOBULIN 01/19/2018 0.9  0.4 - 1.0 g/dL Final    ??? Beta globulin 01/19/2018 1.2  0.7 - 1.3 g/dL Final   ??? Gamma globulin 01/19/2018 0.9  0.4 - 1.8 g/dL Final   ??? M-Spike 01/19/2018 Not Observed  Not Observed g/dL Final   ??? Globulin, total 01/19/2018 3.3  2.2 - 3.9 g/dL Final   ??? A/G ratio 01/19/2018 1.3  0.7 - 1.7   Final   Hospital Outpatient Visit on 01/18/2018   Component Date Value Ref Range Status   ??? WBC 01/18/2018 3.7* 4.5 - 13.0 K/uL Final   ??? RBC 01/18/2018 3.71* 4.10 - 5.10 M/uL Final   ??? HGB 01/18/2018 11.3* 12.0 - 16.0 g/dL Final   ??? HCT 01/18/2018 34.2* 36 - 48 % Final   ??? MCV 01/18/2018 92.2  78 - 102 FL Final   ??? MCH 01/18/2018 30.5  25.0 - 35.0 PG Final   ??? MCHC 01/18/2018 33.0  31 - 37 g/dL Final   ??? RDW 01/18/2018 14.5  11.5 - 14.5 % Final   ??? PLATELET 01/18/2018 80* 140 - 440 K/uL Final   ??? NEUTROPHILS 01/18/2018 79* 40 - 70 % Final   ??? MIXED CELLS 01/18/2018 10  0.1 - 17 % Final   ??? LYMPHOCYTES 01/18/2018 12* 14 - 44 % Final   ??? ABS. NEUTROPHILS 01/18/2018 2.9  1.8 - 9.5 K/UL Final   ??? ABS. MIXED CELLS 01/18/2018 0.4  0.0 - 2.3 K/uL Final   ??? ABS. LYMPHOCYTES 01/18/2018 0.4* 1.1 - 5.9 K/UL Final    Test performed at [place] or Outpatient Infusion Center Location. Reviewed by Medical Director.   ??? DF 01/18/2018 AUTOMATED    Final   Hospital Outpatient Visit on 01/05/2018   Component Date Value Ref Range Status   ??? WBC 01/05/2018 3.0* 4.5 - 13.0 K/uL Final   ??? RBC 01/05/2018 3.72* 4.10 - 5.10 M/uL Final   ??? HGB 01/05/2018 11.1* 12.0 - 16.0 g/dL Final   ??? HCT 01/05/2018 34.6* 36 - 48 % Final   ???  MCV 01/05/2018 93.0  78 - 102 FL Final   ??? MCH 01/05/2018 29.8  25.0 - 35.0 PG Final   ??? MCHC 01/05/2018 32.1  31 - 37 g/dL Final   ??? RDW 01/05/2018 14.9* 11.5 - 14.5 % Final   ??? PLATELET 01/05/2018 77* 140 - 440 K/uL Final   ??? NEUTROPHILS 01/05/2018 73* 40 - 70 % Final   ??? MIXED CELLS 01/05/2018 11  0.1 - 17 % Final   ??? LYMPHOCYTES 01/05/2018 16  14 - 44 % Final    ??? ABS. NEUTROPHILS 01/05/2018 2.2  1.8 - 9.5 K/UL Final   ??? ABS. MIXED CELLS 01/05/2018 0.3  0.0 - 2.3 K/uL Final   ??? ABS. LYMPHOCYTES 01/05/2018 0.5* 1.1 - 5.9 K/UL Final    Test performed at [place] or Outpatient Infusion Center Location. Reviewed by Medical Director.   ??? DF 01/05/2018 AUTOMATED    Final   Hospital Outpatient Visit on 12/21/2017   Component Date Value Ref Range Status   ??? WBC 12/21/2017 3.8* 4.5 - 13.0 K/uL Final   ??? RBC 12/21/2017 3.98* 4.10 - 5.10 M/uL Final   ??? HGB 12/21/2017 11.9* 12.0 - 16.0 g/dL Final   ??? HCT 12/21/2017 36.7  36 - 48 % Final   ??? MCV 12/21/2017 92.2  78 - 102 FL Final   ??? MCH 12/21/2017 29.9  25.0 - 35.0 PG Final   ??? MCHC 12/21/2017 32.4  31 - 37 g/dL Final   ??? RDW 12/21/2017 15.4* 11.5 - 14.5 % Final   ??? PLATELET 12/21/2017 83* 140 - 440 K/uL Final   ??? NEUTROPHILS 12/21/2017 78* 40 - 70 % Final   ??? MIXED CELLS 12/21/2017 11  0.1 - 17 % Final   ??? LYMPHOCYTES 12/21/2017 11* 14 - 44 % Final   ??? ABS. NEUTROPHILS 12/21/2017 3.0  1.8 - 9.5 K/UL Final   ??? ABS. MIXED CELLS 12/21/2017 0.4  0.0 - 2.3 K/uL Final   ??? ABS. LYMPHOCYTES 12/21/2017 0.4* 1.1 - 5.9 K/UL Final    Test performed at [place] or Outpatient Infusion Center Location. Reviewed by Medical Director.   ??? DF 12/21/2017 AUTOMATED    Final   Hospital Outpatient Visit on 12/07/2017   Component Date Value Ref Range Status   ??? WBC 12/07/2017 3.1* 4.5 - 13.0 K/uL Final   ??? RBC 12/07/2017 4.04* 4.10 - 5.10 M/uL Final   ??? HGB 12/07/2017 12.0  12.0 - 16.0 g/dL Final   ??? HCT 12/07/2017 37.0  36 - 48 % Final   ??? MCV 12/07/2017 91.6  78 - 102 FL Final   ??? MCH 12/07/2017 29.7  25.0 - 35.0 PG Final   ??? MCHC 12/07/2017 32.4  31 - 37 g/dL Final   ??? RDW 12/07/2017 16.4* 11.5 - 14.5 % Final   ??? PLATELET 12/07/2017 76* 140 - 440 K/uL Final   ??? NEUTROPHILS 12/07/2017 74* 40 - 70 % Final   ??? MIXED CELLS 12/07/2017 10  0.1 - 17 % Final    ??? LYMPHOCYTES 12/07/2017 16  14 - 44 % Final   ??? ABS. NEUTROPHILS 12/07/2017 2.3  1.8 - 9.5 K/UL Final   ??? ABS. MIXED CELLS 12/07/2017 0.3  0.0 - 2.3 K/uL Final   ??? ABS. LYMPHOCYTES 12/07/2017 0.5* 1.1 - 5.9 K/UL Final    Test performed at [place] or Outpatient Infusion Center Location.  Reviewed by Medical Director.   ??? DF 12/07/2017 AUTOMATED    Final          Follow-up and Dispositions    ?? Return in about 1 month (around 03/24/2018) for for medicare wellness.

## 2018-02-22 ENCOUNTER — Encounter: Payer: MEDICARE | Primary: Legal Medicine

## 2018-02-22 LAB — LIPID PANEL
Cholesterol, total: 99 mg/dL — ABNORMAL LOW (ref 100–199)
HDL Cholesterol: 35 mg/dL — ABNORMAL LOW (ref >39)
LDL, calculated: 24 mg/dL (ref 0–99)
Triglyceride: 201 mg/dL — ABNORMAL HIGH (ref 0–149)
VLDL, calculated: 40 mg/dL (ref 5–40)

## 2018-02-22 LAB — MICROALBUMIN, UR, RAND W/ MICROALB/CREAT RATIO
Creatinine, urine random: 48.6 mg/dL
Microalb/Creat ratio (ug/mg creat.): 8.8 mg/g creat (ref 0.0–30.0)
Microalbumin, urine: 4.3 ug/mL

## 2018-02-22 LAB — HEMOGLOBIN A1C W/O EAG: Hemoglobin A1c: 5.6 % (ref 4.8–5.6)

## 2018-02-22 LAB — CVD REPORT

## 2018-02-28 NOTE — Telephone Encounter (Signed)
Spoke to patient about results. Patient acknowledge results given with additional instructions advise per doctor. Patient has no other concerns at this time.

## 2018-02-28 NOTE — Telephone Encounter (Signed)
-----   Message from [name], MD sent at 02/26/2018 12:32 PM EDT -----  No significant abnormality     Diabetes is well controlled   To decrease metformin  500 mg to one tablet daily

## 2018-03-01 ENCOUNTER — Inpatient Hospital Stay: Admit: 2018-03-01 | Primary: Legal Medicine

## 2018-03-01 ENCOUNTER — Inpatient Hospital Stay: Admit: 2018-03-01 | Payer: MEDICARE | Primary: Legal Medicine

## 2018-03-01 ENCOUNTER — Institutional Professional Consult (permissible substitution): Admit: 2018-03-01 | Discharge: 2018-03-01 | Payer: PRIVATE HEALTH INSURANCE | Primary: Legal Medicine

## 2018-03-01 DIAGNOSIS — D508 Other iron deficiency anemias: Secondary | ICD-10-CM

## 2018-03-01 DIAGNOSIS — D464 Refractory anemia, unspecified: Secondary | ICD-10-CM

## 2018-03-01 LAB — CBC WITH 3 PART DIFF
ABS. LYMPHOCYTES: 0.5 10*3/uL — ABNORMAL LOW (ref 1.1–5.9)
ABS. MIXED CELLS: 0.4 10*3/uL (ref 0.0–2.3)
ABS. NEUTROPHILS: 3.7 10*3/uL (ref 1.8–9.5)
HCT: 38.3 % (ref 36–48)
HGB: 12.9 g/dL (ref 12.0–16.0)
LYMPHOCYTES: 10 % — ABNORMAL LOW (ref 14–44)
MCH: 32.3 PG (ref 25.0–35.0)
MCHC: 33.7 g/dL (ref 31–37)
MCV: 96 FL (ref 78–102)
Mixed cells: 8 % (ref 0.1–17)
NEUTROPHILS: 82 % — ABNORMAL HIGH (ref 40–70)
PLATELET: 73 10*3/uL — ABNORMAL LOW (ref 140–440)
RBC: 3.99 M/uL — ABNORMAL LOW (ref 4.10–5.10)
RDW: 17.5 % — ABNORMAL HIGH (ref 11.5–14.5)
WBC: 4.6 10*3/uL (ref 4.5–13.0)

## 2018-03-01 MED ORDER — EPOETIN ALFA-EPBX 10,000 UNIT/ML INJECTION SOLUTION
10000 unit/mL | Freq: Once | INTRAMUSCULAR | Status: DC
Start: 2018-03-01 — End: 2018-03-02

## 2018-03-01 NOTE — Progress Notes (Signed)
[facility] Progress Note    Date: March 01, 2018    Name: [facility]    MRN: [id number]         DOB: [birth year]      Mr. Siddoway was assessed and education was provided.     Mr. Bergum vitals were reviewed and patient was observed for 5 minutes prior to treatment.   Visit Vitals  BP 102/58 (BP 1 Location: Left arm, BP Patient Position: Sitting)   Pulse 66   Temp 97.4 ??F (36.3 ??C)   Resp 16   SpO2 99%       Lab results were obtained and reviewed.  Recent Results (from the past 12 hour(s))   CBC WITH 3 PART DIFF    Collection Time: 03/01/18  2:11 PM   Result Value Ref Range    WBC 4.6 4.5 - 13.0 K/uL    RBC 3.99 (L) 4.10 - 5.10 M/uL    HGB 12.9 12.0 - 16.0 g/dL    HCT 38.3 36 - 48 %    MCV 96.0 78 - 102 FL    MCH 32.3 25.0 - 35.0 PG    MCHC 33.7 31 - 37 g/dL    RDW 17.5 (H) 11.5 - 14.5 %    PLATELET 73 (L) 140 - 440 K/uL    NEUTROPHILS 82 (H) 40 - 70 %    MIXED CELLS 8 0.1 - 17 %    LYMPHOCYTES 10 (L) 14 - 44 %    ABS. NEUTROPHILS 3.7 1.8 - 9.5 K/UL    ABS. MIXED CELLS 0.4 0.0 - 2.3 K/uL    ABS. LYMPHOCYTES 0.5 (L) 1.1 - 5.9 K/UL    DF AUTOMATED         Retacrit not given for H&H 12.9/38.3.      Patient armband removed and shredded.    Mr. Crisco was discharged from [place] in stable condition at 1425. He is to return on 03/29/18 at 1400 for his next appointment for CBC/Retacrot Q 2 wks.    Lynnda Shields, RN  March 01, 2018

## 2018-03-15 NOTE — Telephone Encounter (Signed)
Pt's wife called stating pt was recently instructed to decrease metformin 500 mg to one tablet daily; and since the dosage change the pt has had to use insulin more frequently.  Would like to speak with nurse regarding.  Please f/u

## 2018-03-15 NOTE — Telephone Encounter (Signed)
LVM returning patient's call

## 2018-03-15 NOTE — Telephone Encounter (Signed)
Patient's wife states since being told on 02/28/18 to decrease Metformin 500 mg to once day (see lab results), he is having to inject insulin 3-4 units twice daily. Patient's wife states he was going 2 or 3 days without having to give himself insulin before the Metformin decrease. Patient and wife would like the Metformin to be increased again. Please advise. (wife notified that Dr. Phineas Douglas is not in office today) Asked patient's wife if he was having any hypoglycemia symptoms in which wife stated "no".

## 2018-03-16 ENCOUNTER — Encounter: Attending: Hematology & Oncology | Primary: Legal Medicine

## 2018-03-16 NOTE — Telephone Encounter (Signed)
Okay to continue his metformin twice a day, and take insulin only as needed

## 2018-03-22 ENCOUNTER — Inpatient Hospital Stay: Admit: 2018-03-22 | Payer: MEDICARE | Primary: Legal Medicine

## 2018-03-22 ENCOUNTER — Encounter

## 2018-03-22 ENCOUNTER — Ambulatory Visit
Admit: 2018-03-22 | Discharge: 2018-03-22 | Payer: PRIVATE HEALTH INSURANCE | Attending: Hematology & Oncology | Primary: Legal Medicine

## 2018-03-22 DIAGNOSIS — D469 Myelodysplastic syndrome, unspecified: Secondary | ICD-10-CM

## 2018-03-22 DIAGNOSIS — D649 Anemia, unspecified: Secondary | ICD-10-CM

## 2018-03-22 NOTE — Progress Notes (Signed)
Hematology/Oncology  Progress Note    Name: William Epps Sr.  Date: 03/22/2018  DOB: [birth year]    PCP: [name], MD     William Blanchard is a 72 y.o.-year-old man who has myelodysplastic syndrome, chronic leukopenia, and severe anemia.  Current therapy: Procrit 60,000 units every 2 weeks when the hemoglobin is below 10 g/dL.    Subjective:     William Blanchard is a 72 year old man who has myelodysplastic syndrome with severe anemia.  He also has chronic leukopenia and thrombocytopenia.  He is currently receiving Procrit 60,000 units every 2 weeks. His wife reports he is eating well and sleeping better. He denies tiredness, weakness, and fatigue. He denies shortness of breath, chest pain, and dizziness. He does not have any concerns or complaints to report at this time.  Since his last clinic visit he received 4 doses of intravenous Venofer after he was found to have a ferritin level of 8 ng/mL.  The iron therapy was tolerated well. Patient is here for follow up. His wife is with him today. Wife said that patient is always tired and sleepy. She denies patient having fever, night sweat, skin rashes and recent infection.  He has no other physical complaints to report at this time.      Past medical history, family history, and social history: these were reviewed and remains unchanged.    Past Medical History:   Diagnosis Date   ??? Anemia    ??? Anxiety    ??? Chronic pain    ??? Cirrhosis of liver ([place])    ??? Diabetes ([place])    ??? GERD (gastroesophageal reflux disease)    ??? Hyperlipemia    ??? Hypertension    ??? Hypotension    ??? Migraine    ??? Other cirrhosis of liver ([place])    ??? Stroke [facility])     had 3 strokes    ??? Thrombocytopenia ([place])      Past Surgical History:   Procedure Laterality Date   ??? HX CHOLECYSTECTOMY     ??? HX ORTHOPAEDIC      R hand sx   ??? HX OTHER SURGICAL      Hand surgery- Nail gun went through finger     Social History     Socioeconomic History   ??? Marital status: MARRIED     Spouse name: Not on file    ??? Number of children: Not on file   ??? Years of education: Not on file   ??? Highest education level: Not on file   Occupational History   ??? Not on file   Social Needs   ??? Financial resource strain: Not on file   ??? Food insecurity:     Worry: Not on file     Inability: Not on file   ??? Transportation needs:     Medical: Not on file     Non-medical: Not on file   Tobacco Use   ??? Smoking status: Former Smoker   ??? Smokeless tobacco: Never Used   ??? Tobacco comment: quit years ago   Substance and Sexual Activity   ??? Alcohol use: No   ??? Drug use: No   ??? Sexual activity: Never     Partners: Male   Lifestyle   ??? Physical activity:     Days per week: Not on file     Minutes per session: Not on file   ??? Stress: Not on file   Relationships   ??? Social  connections:     Talks on phone: Not on file     Gets together: Not on file     Attends religious service: Not on file     Active member of club or organization: Not on file     Attends meetings of clubs or organizations: Not on file     Relationship status: Not on file   ??? Intimate partner violence:     Fear of current or ex partner: Not on file     Emotionally abused: Not on file     Physically abused: Not on file     Forced sexual activity: Not on file   Other Topics Concern   ??? Not on file   Social History Narrative    ** Merged History Encounter **          Family History   Problem Relation Age of Onset   ??? Alzheimer Mother    ??? Diabetes Mother    ??? Cancer Mother         colon cancer    ??? Hypertension Father    ??? Heart Disease Father    ??? Cancer Father         prostate, lung cancer    ??? Diabetes Sister    ??? Heart Disease Sister    ??? Diabetes Brother      Current Outpatient Medications   Medication Sig Dispense Refill   ??? LORazepam (ATIVAN) 1 mg tablet Take 1 Tab by mouth every eight (8) hours as needed for Anxiety for up to 90 days. Max Daily Amount: 3 mg. 90 Tab 0   ??? metFORMIN (GLUCOPHAGE) 500 mg tablet TAKE 2 TABLETS BY MOUTH TWICE DAILY  WITH MEALS(GENERIC FOR GLUCOPHAGE) 360 Tab 0   ??? metoclopramide HCl (REGLAN) 10 mg tablet Take 10 mg by mouth two (2) times daily as needed.     ??? naproxen (NAPROSYN) 500 mg tablet Take 500 mg by mouth two (2) times daily as needed.     ??? venlafaxine (EFFEXOR) 75 mg tablet Take 25 mg by mouth daily.     ??? rosuvastatin (CRESTOR) 10 mg tablet TAKE 1 TABLET BY MOUTH NIGHTLY 90 Tab 0   ??? GENERLAC 10 gram/15 mL solution TAKE 30ML BY MOUTH TWICE DAILY 5676 mL 0   ??? pramipexole (MIRAPEX) 0.125 mg tablet TAKE 1 TABLET BY MOUTH NIGHTLY 30 Tab 0   ??? propranolol (INDERAL) 20 mg tablet TAKE 1 TABLET BY MOUTH THREE TIMES DAILY 270 Tab 0   ??? sucralfate (CARAFATE) 1 gram tablet TAKE 1 TABLET BY MOUTH FOUR TIMES DAILY WITH MEALS AND AT BEDTIME 360 Tab 0   ??? finasteride (PROSCAR) 5 mg tablet TAKE 1 TABLET BY MOUTH DAILY 90 Tab 0   ??? topiramate (TOPAMAX) 100 mg tablet TAKE 1 TABLET BY MOUTH DAILY 90 Tab 0   ??? citalopram (CELEXA) 40 mg tablet TAKE 1 TABLET BY MOUTH DAILY 90 Tab 0   ??? nortriptyline (PAMELOR) 10 mg capsule Take 10 mg by mouth nightly.     ??? lancets (ONETOUCH ULTRASOFT LANCETS) misc Use to test blood sugars four time daily. 1 Each 11   ??? glucose blood VI test strips (ASCENSIA AUTODISC VI, ONE TOUCH ULTRA TEST VI) strip Use to test blood sugars four times daily with Onetouch ultra 2. 400 Strip 3   ??? Insulin Needles, Disposable, (BD ULTRA-FINE SHORT PEN NEEDLE) 31 gauge x 5/16" ndle Use 4 times daily to inject insulin. 1 Package 11   ???  oxyCODONE IR (ROXICODONE) 10 mg tab immediate release tablet Take 10 mg by mouth every six (6) hours as needed for Pain.     ??? butalbital-acetaminophen-caffeine (FIORICET, ESGIC) 50-325-40 mg per tablet Take 1 Tab by mouth every six (6) hours as needed for Pain. 20 Tab 1   ??? HUMULIN R REGULAR U-100 INSULN 100 unit/mL injection As needed per sliding scale 1 Vial 6   ??? Insulin Syringe-Needle U-100 1 mL 29 gauge x 1/2" syrg Three times per day and as needed as per sliding scale 100 Syringe 6    ??? prochlorperazine (COMPAZINE) 10 mg tablet Take 0.5 Tabs by mouth every six (6) hours as needed. 30 Tab 5   ??? gemfibrozil (LOPID) 600 mg tablet TAKE 1 TABLET BY MOUTH TWICE DAILY 180 Tab 3   ??? ketoconazole (NIZORAL) 2 % topical cream Apply  to affected area daily. 15 g 0   ??? metaxalone (SKELAXIN) 800 mg tablet Take 800 mg by mouth three (3) times daily as needed.     ??? augmented betamethasone dipropionate (DIPROLENE-AF) 0.05 % ointment Apply  to affected area two (2) times a day.     ??? naloxone (NARCAN) 4 mg/actuation nasal spray Use 1 spray intranasally into 1 nostril. Use a new Narcan nasal spray for subsequent doses and administer into alternating nostrils. May repeat every 2 to 3 minutes as needed for opioid overdose symptoms. 1 Each 0   ??? aspirin 81 mg chewable tablet Take 81 mg by mouth daily.     ??? cholecalciferol, vitamin D3, (VITAMIN D3) 2,000 unit tab Take  by mouth.     ??? B.infantis-B.ani-B.long-B.bifi (PROBIOTIC 4X) 10-15 mg TbEC Take  by mouth.     ??? cyclobenzaprine (FLEXERIL) 10 mg tablet Take  by mouth three (3) times daily as needed for Muscle Spasm(s).         Review of Systems  Constitutional: The patient has no acute distress or discomfort.  HEENT: The patient denies recent head trauma, eye pain, blurred vision,  hearing deficit, oropharyngeal mucosal pain or lesions, and the patient denies throat pain or discomfort.  Lymphatics: The patient denies palpable peripheral lymphadenopathy.  Hematologic: The patient denies having bruising, bleeding, or progressive fatigue.  Respiratory: Patient denies having shortness of breath, cough, sputum production, fever, or dyspnea on exertion.  Cardiovascular: The patient denies having leg pain, leg swelling, heart palpitations, chest permit, chest pain, or lightheadedness.  The patient denies having dyspnea on exertion.  Gastrointestinal: The patient denies having nausea, emesis, or diarrhea. The patient denies having any hematemesis or blood in the stool.   Genitourinary: Patient denies having urinary urgency, frequency, or dysuria.  The patient denies having blood in the urine.  Psychological: The patient denies having symptoms of nervousness, anxiety, depression, or thoughts of harming self.  Skin: Patient denies having skin rashes, skin, ulcerations, or unexplained itching or pruritus.  Musculoskeletal: The patient denies having pain in the joints or bones.      Objective:     Visit Vitals  BP 103/64   Pulse 70   Resp 18   Ht 5\' 9"  (1.753 m)   Wt 59 kg (130 lb)   SpO2 98%   BMI 19.20 kg/m??     ECOG PS=0  Physical Exam:   Gen. Appearance: The patient is in no acute distress.  Skin: There is no bruise or rash.  HEENT: The exam is unremarkable.  Neck: Supple without lymphadenopathy or thyromegaly.  Lungs: Clear to auscultation and percussion;  there are no wheezes or rhonchi.  Heart: Regular rate and rhythm; there are no murmurs, gallops, or rubs.  Abdomen: Bowel sounds are present and normal.  There is no guarding, tenderness, or hepatosplenomegaly.  Extremities: There is no clubbing, cyanosis, or edema.  Neurologic: There are no focal neurologic deficits.  Lymphatics: There is no palpable peripheral lymphadenopathy. Musculoskeletal: The patient has full range of motion at all joints.  There is no evidence of joint deformity or effusions.  There is no focal joint tenderness.  Psychological/psychiatric: There is no clinical evidence of anxiety, depression, or melancholy.    Lab data:      Results for orders placed or performed during the hospital encounter of 03/01/18   CBC WITH 3 PART DIFF     Status: Abnormal   Result Value Ref Range Status    WBC 4.6 4.5 - 13.0 K/uL Final    RBC 3.99 (L) 4.10 - 5.10 M/uL Final    HGB 12.9 12.0 - 16.0 g/dL Final    HCT 38.3 36 - 48 % Final    MCV 96.0 78 - 102 FL Final    MCH 32.3 25.0 - 35.0 PG Final    MCHC 33.7 31 - 37 g/dL Final    RDW 17.5 (H) 11.5 - 14.5 % Final    PLATELET 73 (L) 140 - 440 K/uL Final     NEUTROPHILS 82 (H) 40 - 70 % Final    MIXED CELLS 8 0.1 - 17 % Final    LYMPHOCYTES 10 (L) 14 - 44 % Final    ABS. NEUTROPHILS 3.7 1.8 - 9.5 K/UL Final    ABS. MIXED CELLS 0.4 0.0 - 2.3 K/uL Final    ABS. LYMPHOCYTES 0.5 (L) 1.1 - 5.9 K/UL Final     Comment: Test performed at [place] or [facility] Location. Reviewed by Medical Director.    DF AUTOMATED   Final           Assessment:     1. Myelodysplastic syndrome ([place])    2. Chronic anemia    3. Thrombocytopenia ([place])      Plan:     Thrombocytopenia: The patient has a stable platelet count of 73,000 K/uL. No therapeutic intervention is warranted.    Chronic leukopenia: The CBC from 03/01/2018 showed that his WBC count is 4.6 K/uL with an absolute neutrophil count which is normal at 3.7. However the absolute lymphocyte count is low at 0.5 K/UL. We will continue to monitor every 2 months. Therapeutic intervention is not warranted. Infection prevention was discussed with patient and his wife.      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  This is a significant improvement after receiving intravenous iron for 4 doses in the form of Venofer.  I will check his iron profile and ferritin levels at this time along with comprehensive metabolic panel. If his ferritin level is below 25 we will need to give him intravenous iron to replenish his ferritin level. Patient is currently being monitored for Procrit injection every 2 weeks. Patient said that he has not had Procrit injection since February, 2019, therefore, assessment for Procrit injection was changed from every 2 weeks to every 4 weeks. Patient is aware that if his hemoglobin and hematocrit go below 10 g/dL and 30 % respectively, he will be given  Procrit 60,000 units.       Follow-up in 2 months or sooner if indicated.    Orders Placed This Encounter   ??? CBC WITH AUTOMATED DIFF     Standing Status:   Future     Standing Expiration Date:   03/23/2019   ??? IRON PROFILE     Standing Status:   Future     Standing Expiration Date:   07/28/8655   ??? METABOLIC PANEL, COMPREHENSIVE     Standing Status:   Future     Standing Expiration Date:   03/23/2019   ??? SPEP     Standing Status:   Future     Standing Expiration Date:   03/23/2019   ??? FERRITIN     Standing Status:   Future     Standing Expiration Date:   03/23/2019         Nicki Guadalajara, NP  03/22/2018       I have assessed the patient independently and  agree with the full assessment as outlined.  Joycelyn Das, MD, FACP                                                                                        Hematology/Oncology  Progress Note    Name: Brannan Cassedy Sr.  Date: 03/22/2018  DOB: [birth year]    PCP: [name], MD     William Blanchard is a 72 y.o.-year-old man who has myelodysplastic syndrome, chronic leukopenia, and severe anemia.  Current therapy: Procrit 60,000 units every 2 weeks when the hemoglobin is below 10 g/dL.    Subjective:     William Blanchard is a 72 year old man who has myelodysplastic syndrome with severe anemia.  He also has chronic leukopenia and thrombocytopenia.  He is currently receiving Procrit 60,000 units every 2 weeks.  Today he is complaining of some fatigue and weakness.  His wife reports that although he is eating well he is continuing to lose weight and he is hypersomnolent, sleeping all of the time.    Past medical history, family history, and social history: these were reviewed and remains unchanged.    Past Medical History:   Diagnosis Date   ??? Anemia    ??? Anxiety    ??? Chronic pain    ??? Cirrhosis of liver ([place])    ??? Diabetes ([place])    ??? GERD (gastroesophageal reflux disease)    ??? Hyperlipemia    ??? Hypertension    ??? Hypotension    ??? Migraine    ??? Other cirrhosis of liver ([place])    ??? Stroke [facility])      had 3 strokes    ??? Thrombocytopenia ([place])      Past Surgical History:   Procedure Laterality Date   ??? HX CHOLECYSTECTOMY     ??? HX ORTHOPAEDIC      R hand sx   ??? HX OTHER SURGICAL      Hand surgery- Nail gun went through finger     Social History     Socioeconomic History   ??? Marital status: MARRIED     Spouse name: Not on  file   ??? Number of children: Not on file   ??? Years of education: Not on file   ??? Highest education level: Not on file   Occupational History   ??? Not on file   Social Needs   ??? Financial resource strain: Not on file   ??? Food insecurity:     Worry: Not on file     Inability: Not on file   ??? Transportation needs:     Medical: Not on file     Non-medical: Not on file   Tobacco Use   ??? Smoking status: Former Smoker   ??? Smokeless tobacco: Never Used   ??? Tobacco comment: quit years ago   Substance and Sexual Activity   ??? Alcohol use: No   ??? Drug use: No   ??? Sexual activity: Never     Partners: Male   Lifestyle   ??? Physical activity:     Days per week: Not on file     Minutes per session: Not on file   ??? Stress: Not on file   Relationships   ??? Social connections:     Talks on phone: Not on file     Gets together: Not on file     Attends religious service: Not on file     Active member of club or organization: Not on file     Attends meetings of clubs or organizations: Not on file     Relationship status: Not on file   ??? Intimate partner violence:     Fear of current or ex partner: Not on file     Emotionally abused: Not on file     Physically abused: Not on file     Forced sexual activity: Not on file   Other Topics Concern   ??? Not on file   Social History Narrative    ** Merged History Encounter **          Family History   Problem Relation Age of Onset   ??? Alzheimer Mother    ??? Diabetes Mother    ??? Cancer Mother         colon cancer    ??? Hypertension Father    ??? Heart Disease Father    ??? Cancer Father         prostate, lung cancer    ??? Diabetes Sister    ??? Heart Disease Sister    ??? Diabetes Brother       Current Outpatient Medications   Medication Sig Dispense Refill   ??? LORazepam (ATIVAN) 1 mg tablet Take 1 Tab by mouth every eight (8) hours as needed for Anxiety for up to 90 days. Max Daily Amount: 3 mg. 90 Tab 0   ??? metFORMIN (GLUCOPHAGE) 500 mg tablet TAKE 2 TABLETS BY MOUTH TWICE DAILY WITH MEALS(GENERIC FOR GLUCOPHAGE) 360 Tab 0   ??? metoclopramide HCl (REGLAN) 10 mg tablet Take 10 mg by mouth two (2) times daily as needed.     ??? naproxen (NAPROSYN) 500 mg tablet Take 500 mg by mouth two (2) times daily as needed.     ??? venlafaxine (EFFEXOR) 75 mg tablet Take 25 mg by mouth daily.     ??? rosuvastatin (CRESTOR) 10 mg tablet TAKE 1 TABLET BY MOUTH NIGHTLY 90 Tab 0   ??? GENERLAC 10 gram/15 mL solution TAKE 30ML BY MOUTH TWICE DAILY 5676 mL 0   ??? pramipexole (MIRAPEX) 0.125 mg tablet TAKE 1 TABLET BY MOUTH NIGHTLY 30  Tab 0   ??? propranolol (INDERAL) 20 mg tablet TAKE 1 TABLET BY MOUTH THREE TIMES DAILY 270 Tab 0   ??? sucralfate (CARAFATE) 1 gram tablet TAKE 1 TABLET BY MOUTH FOUR TIMES DAILY WITH MEALS AND AT BEDTIME 360 Tab 0   ??? finasteride (PROSCAR) 5 mg tablet TAKE 1 TABLET BY MOUTH DAILY 90 Tab 0   ??? topiramate (TOPAMAX) 100 mg tablet TAKE 1 TABLET BY MOUTH DAILY 90 Tab 0   ??? citalopram (CELEXA) 40 mg tablet TAKE 1 TABLET BY MOUTH DAILY 90 Tab 0   ??? nortriptyline (PAMELOR) 10 mg capsule Take 10 mg by mouth nightly.     ??? lancets (ONETOUCH ULTRASOFT LANCETS) misc Use to test blood sugars four time daily. 1 Each 11   ??? glucose blood VI test strips (ASCENSIA AUTODISC VI, ONE TOUCH ULTRA TEST VI) strip Use to test blood sugars four times daily with Onetouch ultra 2. 400 Strip 3   ??? Insulin Needles, Disposable, (BD ULTRA-FINE SHORT PEN NEEDLE) 31 gauge x 5/16" ndle Use 4 times daily to inject insulin. 1 Package 11   ??? oxyCODONE IR (ROXICODONE) 10 mg tab immediate release tablet Take 10 mg by mouth every six (6) hours as needed for Pain.     ??? butalbital-acetaminophen-caffeine (FIORICET, ESGIC) 50-325-40 mg per  tablet Take 1 Tab by mouth every six (6) hours as needed for Pain. 20 Tab 1   ??? HUMULIN R REGULAR U-100 INSULN 100 unit/mL injection As needed per sliding scale 1 Vial 6   ??? Insulin Syringe-Needle U-100 1 mL 29 gauge x 1/2" syrg Three times per day and as needed as per sliding scale 100 Syringe 6   ??? prochlorperazine (COMPAZINE) 10 mg tablet Take 0.5 Tabs by mouth every six (6) hours as needed. 30 Tab 5   ??? gemfibrozil (LOPID) 600 mg tablet TAKE 1 TABLET BY MOUTH TWICE DAILY 180 Tab 3   ??? ketoconazole (NIZORAL) 2 % topical cream Apply  to affected area daily. 15 g 0   ??? metaxalone (SKELAXIN) 800 mg tablet Take 800 mg by mouth three (3) times daily as needed.     ??? augmented betamethasone dipropionate (DIPROLENE-AF) 0.05 % ointment Apply  to affected area two (2) times a day.     ??? naloxone (NARCAN) 4 mg/actuation nasal spray Use 1 spray intranasally into 1 nostril. Use a new Narcan nasal spray for subsequent doses and administer into alternating nostrils. May repeat every 2 to 3 minutes as needed for opioid overdose symptoms. 1 Each 0   ??? aspirin 81 mg chewable tablet Take 81 mg by mouth daily.     ??? cholecalciferol, vitamin D3, (VITAMIN D3) 2,000 unit tab Take  by mouth.     ??? B.infantis-B.ani-B.long-B.bifi (PROBIOTIC 4X) 10-15 mg TbEC Take  by mouth.     ??? cyclobenzaprine (FLEXERIL) 10 mg tablet Take  by mouth three (3) times daily as needed for Muscle Spasm(s).         Review of Systems  Constitutional: The patient has no acute distress or discomfort.  HEENT: The patient denies recent head trauma, eye pain, blurred vision,  hearing deficit, oropharyngeal mucosal pain or lesions, and the patient denies throat pain or discomfort.  Lymphatics: The patient denies palpable peripheral lymphadenopathy.  Hematologic: The patient denies having bruising, bleeding, or progressive fatigue.  Respiratory: Patient denies having shortness of breath, cough, sputum production, fever, or dyspnea on exertion.   Cardiovascular: The patient denies having leg pain, leg swelling,  heart palpitations, chest permit, chest pain, or lightheadedness.  The patient denies having dyspnea on exertion.  Gastrointestinal: The patient denies having nausea, emesis, or diarrhea. The patient denies having any hematemesis or blood in the stool.  Genitourinary: Patient denies having urinary urgency, frequency, or dysuria.  The patient denies having blood in the urine.  Psychological: The patient denies having symptoms of nervousness, anxiety, depression, or thoughts of harming self.  Skin: Patient denies having skin rashes, skin, ulcerations, or unexplained itching or pruritus.  Musculoskeletal: The patient denies having pain in the joints or bones.      Objective:     Visit Vitals  BP 103/64   Pulse 70   Resp 18   Ht 5\' 9"  (1.753 m)   Wt 59 kg (130 lb)   SpO2 98%   BMI 19.20 kg/m??     ECOG PS=0  Physical Exam:   Gen. Appearance: The patient is in no acute distress.  Skin: There is no bruise or rash.  HEENT: The exam is unremarkable.  Neck: Supple without lymphadenopathy or thyromegaly.  Lungs: Clear to auscultation and percussion; there are no wheezes or rhonchi.  Heart: Regular rate and rhythm; there are no murmurs, gallops, or rubs.  Abdomen: Bowel sounds are present and normal.  There is no guarding, tenderness, or hepatosplenomegaly.  Extremities: There is no clubbing, cyanosis, or edema.  Neurologic: There are no focal neurologic deficits.  Lymphatics: There is no palpable peripheral lymphadenopathy. Musculoskeletal: The patient has full range of motion at all joints.  There is no evidence of joint deformity or effusions.  There is no focal joint tenderness.  Psychological/psychiatric: There is no clinical evidence of anxiety, depression, or melancholy.    Lab data:      Results for orders placed or performed during the hospital encounter of 03/01/18   CBC WITH 3 PART DIFF     Status: Abnormal   Result Value Ref Range Status     WBC 4.6 4.5 - 13.0 K/uL Final    RBC 3.99 (L) 4.10 - 5.10 M/uL Final    HGB 12.9 12.0 - 16.0 g/dL Final    HCT 38.3 36 - 48 % Final    MCV 96.0 78 - 102 FL Final    MCH 32.3 25.0 - 35.0 PG Final    MCHC 33.7 31 - 37 g/dL Final    RDW 17.5 (H) 11.5 - 14.5 % Final    PLATELET 73 (L) 140 - 440 K/uL Final    NEUTROPHILS 82 (H) 40 - 70 % Final    MIXED CELLS 8 0.1 - 17 % Final    LYMPHOCYTES 10 (L) 14 - 44 % Final    ABS. NEUTROPHILS 3.7 1.8 - 9.5 K/UL Final    ABS. MIXED CELLS 0.4 0.0 - 2.3 K/uL Final    ABS. LYMPHOCYTES 0.5 (L) 1.1 - 5.9 K/UL Final     Comment: Test performed at [place] or [facility]patient [facility]fusion [facility] Location. Reviewed by Medical Director.    DF AUTOMATED   Final           Assessment:     1. Myelodysplastic syndrome ([place])    2. Chronic anemia    3. 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  He did receive Procrit at  a dose of 60,000 units yesterday.  I will check his iron profile and ferritin levels at this time.  If his ferritin level is below 100 and we will need to give him intravenous iron to replenish his ferritin level.    Chronic leukopenia: The CBC from today shows that his WBC count is 3.3 with an absolute neutrophil count which is normal at 2.6.  However the absolute lymphocyte count is low at 0.5.  These will continue to be monitored.  Therapeutic intervention is not warranted.    Thrombocytopenia: The patient has a stable lytic count of 85,000.  No therapeutic intervention is warranted.    Follow-up in 2 months  Orders Placed This Encounter   ??? CBC WITH AUTOMATED DIFF     Standing Status:   Future     Standing Expiration Date:   03/23/2019   ??? IRON PROFILE     Standing Status:   Future     Standing Expiration Date:   9/38/1829   ??? METABOLIC PANEL, COMPREHENSIVE      Standing Status:   Future     Standing Expiration Date:   03/23/2019   ??? SPEP     Standing Status:   Future     Standing Expiration Date:   03/23/2019   ??? FERRITIN     Standing Status:   Future     Standing Expiration Date:   03/23/2019       Nicki Guadalajara, NP  03/22/2018      Please note: This document has been produced using voice recognition software.  Unrecognized errors in transcription may be present.

## 2018-03-22 NOTE — Progress Notes (Signed)
Result reviewed and noted. Will continue to monitor.

## 2018-03-22 NOTE — Patient Instructions (Signed)
Myelodysplastic Syndromes: Care Instructions  Your Care Instructions  Myelodysplastic syndromes, also called MDS, are a group of rare conditions in which the bone marrow does not make enough healthy blood cells. Normally, the bone marrow makes red blood cells, white blood cells, and platelets. These cells carry oxygen in the blood, help the body fight infections, and help the blood clot. With MDS, you may feel weak and tired, get infections often, and bruise easily, although symptoms tend to vary.  MDS is a form of blood cancer. In some cases, MDS can turn into acute myeloid leukemia (AML), another type of cancer. Some people develop MDS after treatment for cancer or exposure to pesticides or other chemicals. But in most cases, the cause of MDS is not known.  Your doctor will use the results of blood tests to guide your treatment. There are many types of MDS, with different treatment plans for each. If you have enough red blood cells and are feeling all right, you may not need active treatment, but you and your doctor will want to watch your condition carefully. If you start feeling lightheaded and have no energy, you may need a blood transfusion. Your doctor also may give you antibiotics to prevent or treat infection.  Follow-up care is a key part of your treatment and safety. Be sure to make and go to all appointments, and call your doctor if you are having problems. It's also a good idea to know your test results and keep a list of the medicines you take.  How can you care for yourself at home?  ?? Take your medicines exactly as prescribed. Call your doctor if you think you are having a problem with your medicine. You will get more details on the specific medicines your doctor prescribes.  ?? If your doctor prescribed antibiotics, take them as directed. Do not stop taking them just because you feel better. You need to take the full course of antibiotics. If you have side effects from antibiotics, tell  your doctor.  ?? Take steps to control your stress and workload. Learn relaxation techniques.  ? Share your feelings. Stress and tension affect our emotions. By expressing your feelings to others, you may be able to understand and cope with them.  ? Consider joining a support group. Talking about a problem with your spouse, a good friend, or other people with similar problems is a good way to reduce tension and stress.  ? Express yourself with art. Try writing, crafts, dance, or art to relieve stress.  ? Be kind to your body and mind. Getting enough sleep, eating a healthy diet, and taking time to do things you enjoy can contribute to an overall feeling of balance in your life and help reduce stress.  ? Get help if you need it. Discuss your concerns with your doctor or counselor.  ?? Do not smoke. Smoking can make blood problems worse. If you need help quitting, talk to your doctor about stop-smoking programs and medicines. These can increase your chances of quitting for good.  ?? If you have not already done so, prepare a list of advance directives. Advance directives are instructions to your doctor and family members about what kind of care you want if you become unable to speak or express yourself.  ?? Call the [facility] [phone]) or visit its website at [email or web address] for more information.  When should you call for help?  Call 911 anytime you think you may need emergency care. For example,  call if:  ?? ?? You passed out (lost consciousness).   ??Call your doctor now or seek immediate medical care if:  ?? ?? You have a fever.   ?? ?? You have abnormal bleeding.   ?? ?? You have new or worse pain.   ?? ?? You think you have an infection.   ?? ?? You have new symptoms, such as a cough, belly pain, vomiting, diarrhea, or a rash.   ??Watch closely for changes in your health, and be sure to contact your doctor if:  ?? ?? You are much more tired than usual.    ?? ?? You have swollen glands in your armpits, groin, or neck.   ?? ?? You do not get better as expected.   Where can you learn more?  Go to [email or web address].  Enter U281 in the search box to learn more about "Myelodysplastic Syndromes: Care Instructions."  Current as of: June 15, 2017  Content Version: 12.2  ?? 2006-2019 [employer]. Care instructions adapted under license by [employer] (which disclaims liability or warranty for this information). If you have questions about a medical condition or this instruction, always ask your healthcare professional. [place] any warranty or liability for your use of this information.         Myelodysplastic Syndromes: Care Instructions  Your Care Instructions  Myelodysplastic syndromes, also called MDS, are a group of rare conditions in which the bone marrow does not make enough healthy blood cells. Normally, the bone marrow makes red blood cells, white blood cells, and platelets. These cells carry oxygen in the blood, help the body fight infections, and help the blood clot. With MDS, you may feel weak and tired, get infections often, and bruise easily, although symptoms tend to vary.  MDS is a form of blood cancer. In some cases, MDS can turn into acute myeloid leukemia (AML), another type of cancer. Some people develop MDS after treatment for cancer or exposure to pesticides or other chemicals. But in most cases, the cause of MDS is not known.  Your doctor will use the results of blood tests to guide your treatment. There are many types of MDS, with different treatment plans for each. If you have enough red blood cells and are feeling all right, you may not need active treatment, but you and your doctor will want to watch your condition carefully. If you start feeling lightheaded and have no energy, you may need a blood transfusion. Your doctor also may give you  antibiotics to prevent or treat infection.  Follow-up care is a key part of your treatment and safety. Be sure to make and go to all appointments, and call your doctor if you are having problems. It's also a good idea to know your test results and keep a list of the medicines you take.  How can you care for yourself at home?  ?? Take your medicines exactly as prescribed. Call your doctor if you think you are having a problem with your medicine. You will get more details on the specific medicines your doctor prescribes.  ?? If your doctor prescribed antibiotics, take them as directed. Do not stop taking them just because you feel better. You need to take the full course of antibiotics. If you have side effects from antibiotics, tell your doctor.  ?? Take steps to control your stress and workload. Learn relaxation techniques.  ? Share your feelings. Stress and tension affect our emotions. By expressing your feelings to  others, you may be able to understand and cope with them.  ? Consider joining a support group. Talking about a problem with your spouse, a good friend, or other people with similar problems is a good way to reduce tension and stress.  ? Express yourself with art. Try writing, crafts, dance, or art to relieve stress.  ? Be kind to your body and mind. Getting enough sleep, eating a healthy diet, and taking time to do things you enjoy can contribute to an overall feeling of balance in your life and help reduce stress.  ? Get help if you need it. Discuss your concerns with your doctor or counselor.  ?? Do not smoke. Smoking can make blood problems worse. If you need help quitting, talk to your doctor about stop-smoking programs and medicines. These can increase your chances of quitting for good.  ?? If you have not already done so, prepare a list of advance directives. Advance directives are instructions to your doctor and family members about what kind of care you want if you become unable to speak or express  yourself.  ?? Call the [facility] [phone]) or visit its website at [email or web address] for more information.  When should you call for help?  Call 911 anytime you think you may need emergency care. For example, call if:  ?? ?? You passed out (lost consciousness).   ??Call your doctor now or seek immediate medical care if:  ?? ?? You have a fever.   ?? ?? You have abnormal bleeding.   ?? ?? You have new or worse pain.   ?? ?? You think you have an infection.   ?? ?? You have new symptoms, such as a cough, belly pain, vomiting, diarrhea, or a rash.   ??Watch closely for changes in your health, and be sure to contact your doctor if:  ?? ?? You are much more tired than usual.   ?? ?? You have swollen glands in your armpits, groin, or neck.   ?? ?? You do not get better as expected.   Where can you learn more?  Go to [email or web address].  Enter U281 in the search box to learn more about "Myelodysplastic Syndromes: Care Instructions."  Current as of: June 15, 2017  Content Version: 12.2  ?? 2006-2019 [employer]. Care instructions adapted under license by [employer] (which disclaims liability or warranty for this information). If you have questions about a medical condition or this instruction, always ask your healthcare professional. [place] any warranty or liability for your use of this information.

## 2018-03-23 LAB — CBC WITH AUTOMATED DIFF
ABS. BASOPHILS: 0 10*3/uL (ref 0.0–0.1)
ABS. EOSINOPHILS: 0.1 10*3/uL (ref 0.0–0.4)
ABS. LYMPHOCYTES: 0.5 10*3/uL — ABNORMAL LOW (ref 0.9–3.6)
ABS. MONOCYTES: 0.2 10*3/uL (ref 0.05–1.2)
ABS. NEUTROPHILS: 2.6 10*3/uL (ref 1.8–8.0)
BASOPHILS: 1 % (ref 0–2)
EOSINOPHILS: 2 % (ref 0–5)
HCT: 38.9 % (ref 36.0–48.0)
HGB: 12.5 g/dL — ABNORMAL LOW (ref 13.0–16.0)
LYMPHOCYTES: 14 % — ABNORMAL LOW (ref 21–52)
MCH: 31.6 PG (ref 24.0–34.0)
MCHC: 32.1 g/dL (ref 31.0–37.0)
MCV: 98.5 FL — ABNORMAL HIGH (ref 74.0–97.0)
MONOCYTES: 6 % (ref 3–10)
MPV: 12.2 FL — ABNORMAL HIGH (ref 9.2–11.8)
NEUTROPHILS: 77 % — ABNORMAL HIGH (ref 40–73)
PLATELET: 78 10*3/uL — ABNORMAL LOW (ref 135–420)
RBC: 3.95 M/uL — ABNORMAL LOW (ref 4.70–5.50)
RDW: 16.9 % — ABNORMAL HIGH (ref 11.6–14.5)
WBC: 3.4 10*3/uL — ABNORMAL LOW (ref 4.6–13.2)

## 2018-03-23 LAB — METABOLIC PANEL, COMPREHENSIVE
A-G Ratio: 1.5 (ref 0.8–1.7)
ALT (SGPT): 32 U/L (ref 16–61)
AST (SGOT): 28 U/L (ref 10–38)
Albumin: 4.5 g/dL (ref 3.4–5.0)
Alk. phosphatase: 227 U/L — ABNORMAL HIGH (ref 45–117)
Anion gap: 8 mmol/L (ref 3.0–18)
BUN/Creatinine ratio: 17 (ref 12–20)
BUN: 18 MG/DL (ref 7.0–18)
Bilirubin, total: 0.6 MG/DL (ref 0.2–1.0)
CO2: 22 mmol/L (ref 21–32)
Calcium: 9.4 MG/DL (ref 8.5–10.1)
Chloride: 110 mmol/L (ref 100–111)
Creatinine: 1.03 MG/DL (ref 0.6–1.3)
GFR est AA: >60 mL/min/{1.73_m2} (ref >60)
GFR est non-AA: >60 mL/min/{1.73_m2} (ref >60)
Globulin: 3.1 g/dL (ref 2.0–4.0)
Glucose: 150 mg/dL — ABNORMAL HIGH (ref 74–99)
Potassium: 4.5 mmol/L (ref 3.5–5.5)
Protein, total: 7.6 g/dL (ref 6.4–8.2)
Sodium: 140 mmol/L (ref 136–145)

## 2018-03-23 LAB — FERRITIN: Ferritin: 274 NG/ML (ref 8–388)

## 2018-03-23 LAB — IRON PROFILE
Iron % saturation: 20 %
Iron: 70 ug/dL (ref 50–175)
TIBC: 344 ug/dL (ref 250–450)

## 2018-03-23 NOTE — Progress Notes (Signed)
Results reviewed and noted. Will continue to monitor.

## 2018-03-24 MED ORDER — FINASTERIDE 5 MG TAB
5 mg | ORAL_TABLET | ORAL | 0 refills | Status: DC
Start: 2018-03-24 — End: 2018-03-28

## 2018-03-24 NOTE — Progress Notes (Signed)
Patient was called today. Talked to wife about the results of patient's blood work done on 03/22/2018. Wife wants to know why patient is sleepy and tired. I explained to the wife that patient's Ferritin level is 274 NG/ML, iron level is 70 ug/dL, iron saturation is 20%, hemoglobin is 12.5 g/dL and hematocrit is 38.9%. I told the wife that iron infusion is not recommended at this time based on these results. Looking on patient's current medications, the patient is taking Lorazepam for anxiety. I told the wife that this medication might be the reason why he is sleepy and tired. She said that patient has an appointment with his PCP on 04/15/2018. I advised her to address these issues during their visit.

## 2018-03-27 ENCOUNTER — Encounter

## 2018-03-27 LAB — PROTEIN ELECTROPHORESIS
A/G ratio: 1.4 (ref 0.7–1.7)
ALPHA-2 GLOBULIN: 0.9 g/dL (ref 0.4–1.0)
Albumin: 4.1 g/dL (ref 2.9–4.4)
Alpha-1-globulin: 0.3 g/dL (ref 0.0–0.4)
Beta globulin: 1 g/dL (ref 0.7–1.3)
Gamma globulin: 0.8 g/dL (ref 0.4–1.8)
Globulin, total: 3 g/dL (ref 2.2–3.9)
Protein, total: 7.1 g/dL (ref 6.0–8.5)

## 2018-03-28 MED ORDER — SUCRALFATE 1 GRAM TAB
1 gram | ORAL_TABLET | ORAL | 0 refills | Status: DC
Start: 2018-03-28 — End: 2018-06-28

## 2018-03-28 MED ORDER — TOPIRAMATE 100 MG TAB
100 mg | ORAL_TABLET | ORAL | 0 refills | Status: DC
Start: 2018-03-28 — End: 2018-06-27

## 2018-03-28 MED ORDER — PROCHLORPERAZINE MALEATE 10 MG TAB
10 mg | ORAL_TABLET | ORAL | 0 refills | Status: DC
Start: 2018-03-28 — End: 2018-09-19

## 2018-03-28 MED ORDER — FINASTERIDE 5 MG TAB
5 mg | ORAL_TABLET | ORAL | 0 refills | Status: DC
Start: 2018-03-28 — End: 2018-09-24

## 2018-03-28 MED ORDER — GEMFIBROZIL 600 MG TAB
600 mg | ORAL_TABLET | ORAL | 0 refills | Status: DC
Start: 2018-03-28 — End: 2018-06-29

## 2018-03-29 ENCOUNTER — Inpatient Hospital Stay: Payer: MEDICARE | Primary: Legal Medicine

## 2018-03-29 ENCOUNTER — Encounter: Primary: Legal Medicine

## 2018-03-29 DIAGNOSIS — D508 Other iron deficiency anemias: Secondary | ICD-10-CM

## 2018-03-30 ENCOUNTER — Telehealth

## 2018-03-30 NOTE — Telephone Encounter (Signed)
Pt wife calling regarding rx refill for citalopram (CELEXA) 40 mg tablet.  Stated he needs prescription to avoid having sudden withdrawals and should be gradually taken off.  Please f/u

## 2018-03-31 ENCOUNTER — Encounter

## 2018-03-31 MED ORDER — CITALOPRAM 40 MG TAB
40 mg | ORAL_TABLET | Freq: Every day | ORAL | 0 refills | Status: DC
Start: 2018-03-31 — End: 2018-03-31

## 2018-03-31 MED ORDER — CITALOPRAM 20 MG TAB
20 mg | ORAL_TABLET | Freq: Every day | ORAL | 0 refills | Status: DC
Start: 2018-03-31 — End: 2018-03-31

## 2018-03-31 NOTE — Telephone Encounter (Addendum)
I have called the patient and his wife and I have explained to him the need to taper off on citalopram because he is already started on Effexor because Effexor is better was preventing migraine and also help with depression.    To decrease the dose from 40- to -20 daily for 2 weeks and then  take it every other day, and then will contact the patient to see how he is doing w with tapering, when  30 tablets are finished will consider 10 mg every other day

## 2018-03-31 NOTE — Addendum Note (Signed)
Addended by: [name] on: 03/31/2018 04:07 PM     Modules accepted: Orders

## 2018-04-01 ENCOUNTER — Encounter

## 2018-04-03 MED ORDER — CITALOPRAM 20 MG TAB
20 mg | ORAL_TABLET | ORAL | 0 refills | Status: DC
Start: 2018-04-03 — End: 2018-12-07

## 2018-04-04 MED ORDER — PROPRANOLOL 20 MG TAB
20 mg | ORAL_TABLET | ORAL | 0 refills | Status: DC
Start: 2018-04-04 — End: 2018-08-29

## 2018-04-05 ENCOUNTER — Institutional Professional Consult (permissible substitution)
Admit: 2018-04-05 | Discharge: 2018-04-05 | Payer: PRIVATE HEALTH INSURANCE | Attending: Legal Medicine | Primary: Legal Medicine

## 2018-04-05 DIAGNOSIS — Z23 Encounter for immunization: Secondary | ICD-10-CM

## 2018-04-05 NOTE — Progress Notes (Signed)
Verl Blalock Hymes Sr. is a 72 y.o. male presents in office for a influenza vaccine.    Immunization/s administered 04/05/2018 by Wayne Sever with consent.    Patient tolerated procedure well.  No reactions noted.

## 2018-04-24 ENCOUNTER — Inpatient Hospital Stay: Admit: 2018-04-24 | Primary: Legal Medicine

## 2018-04-24 ENCOUNTER — Institutional Professional Consult (permissible substitution): Admit: 2018-04-24 | Discharge: 2018-04-24 | Payer: PRIVATE HEALTH INSURANCE | Primary: Legal Medicine

## 2018-04-24 ENCOUNTER — Inpatient Hospital Stay: Admit: 2018-04-24 | Payer: MEDICARE | Primary: Legal Medicine

## 2018-04-24 DIAGNOSIS — D464 Refractory anemia, unspecified: Secondary | ICD-10-CM

## 2018-04-24 LAB — CBC WITH 3 PART DIFF
ABS. LYMPHOCYTES: 0.5 10*3/uL — ABNORMAL LOW (ref 1.1–5.9)
ABS. MIXED CELLS: 0.4 10*3/uL (ref 0.0–2.3)
ABS. NEUTROPHILS: 2.6 10*3/uL (ref 1.8–9.5)
HCT: 38.4 % (ref 36–48)
HGB: 13.2 g/dL (ref 12.0–16.0)
LYMPHOCYTES: 14 % (ref 14–44)
MCH: 33.2 PG (ref 25.0–35.0)
MCHC: 34.4 g/dL (ref 31–37)
MCV: 96.7 FL (ref 78–102)
Mixed cells: 10 % (ref 0.1–17)
NEUTROPHILS: 76 % — ABNORMAL HIGH (ref 40–70)
PLATELET: 61 10*3/uL — ABNORMAL LOW (ref 140–440)
RBC: 3.97 M/uL — ABNORMAL LOW (ref 4.10–5.10)
RDW: 13.7 % (ref 11.5–14.5)
WBC: 3.5 10*3/uL — ABNORMAL LOW (ref 4.5–13.0)

## 2018-04-24 MED ORDER — EPOETIN ALFA-EPBX 40,000 UNIT/ML INJECTION SOLUTION
40000 unit/mL | Freq: Once | INTRAMUSCULAR | Status: DC
Start: 2018-04-24 — End: 2018-04-25

## 2018-04-24 NOTE — Progress Notes (Signed)
[facility] Progress Note    Date: April 24, 2018    Name: William [facility].    MRN: [id number]         DOB: 11/15/1945      William Blanchard arrived in the [facility] today at 1535, in stable condition, here for Q 4 Week, CBC/Retacrit injection. He was assessed and education was provided.     William Blanchard vitals were reviewed.  Visit Vitals  BP 107/64 (BP 1 Location: Left arm, BP Patient Position: Sitting)   Pulse 66   Temp 98.5 ??F (36.9 ??C)   Resp 20   SpO2 98%           CBC was drawn from his right AC at 546, per order, and without incident.           Lab results were obtained and reviewed.  Recent Results (from the past 12 hour(s))   CBC WITH 3 PART DIFF    Collection Time: 04/24/18  3:46 PM   Result Value Ref Range    WBC 3.5 (L) 4.5 - 13.0 K/uL    RBC 3.97 (L) 4.10 - 5.10 M/uL    HGB 13.2 12.0 - 16.0 g/dL    HCT 38.4 36 - 48 %    MCV 96.7 78 - 102 FL    MCH 33.2 25.0 - 35.0 PG    MCHC 34.4 31 - 37 g/dL    RDW 13.7 11.5 - 14.5 %    PLATELET 61 (L) 140 - 440 K/uL    NEUTROPHILS 76 (H) 40 - 70 %    MIXED CELLS 10 0.1 - 17 %    LYMPHOCYTES 14 14 - 44 %    ABS. NEUTROPHILS 2.6 1.8 - 9.5 K/UL    ABS. MIXED CELLS 0.4 0.0 - 2.3 K/uL    ABS. LYMPHOCYTES 0.5 (L) 1.1 - 5.9 K/UL    DF AUTOMATED               Retacrit Injection was HELD today per order.             William Blanchard tolerated well, and had no complaints.    William Blanchard was discharged from [facility] in stable condition at 1600... He is to return in 4 weeks, on Monday, 05-22-18,  at 1500,  [facility] CBC/Retacrit injection.     [name], RN  April 24, 2018  4:41 PM

## 2018-04-29 MED ORDER — ROSUVASTATIN 10 MG TAB
10 mg | ORAL_TABLET | ORAL | 0 refills | Status: DC
Start: 2018-04-29 — End: 2018-08-03

## 2018-05-16 ENCOUNTER — Ambulatory Visit
Admit: 2018-05-16 | Discharge: 2018-05-16 | Payer: PRIVATE HEALTH INSURANCE | Attending: Legal Medicine | Primary: Legal Medicine

## 2018-05-16 DIAGNOSIS — Z0000 Encounter for general adult medical examination without abnormal findings: Secondary | ICD-10-CM

## 2018-05-16 MED ORDER — LORAZEPAM 1 MG TAB
1 mg | ORAL_TABLET | Freq: Three times a day (TID) | ORAL | 0 refills | Status: DC | PRN
Start: 2018-05-16 — End: 2018-08-22

## 2018-05-16 NOTE — Progress Notes (Signed)
William Blalock Mitrano Sr.     Chief Complaint   Patient presents with   ??? Cholesterol Problem     f/u   ??? Diabetes     f/u   ??? Annual Wellness Visit     Vitals:    05/16/18 1113   BP: 119/61   Pulse: (!) 57   Resp: 22   Temp: 98.1 ??F (36.7 ??C)   TempSrc: Oral   SpO2: 100%   Weight: 133 lb 9.6 oz (60.6 kg)   Height: '5\' 9"'$  (1.753 m)   PainSc:   0 - No pain         HPI:William Blanchard, [name] is here accompanied by his wife for follow-up.    Medication list has been reviewed and updated, I have discussed with patient and his wife that they need to bring the back of his medication each visit.    Patient was admitted for 3 days back in 10/9  For rectal bleeding , he had colonoscopy  And endoscopy was found to have hemorrhoids and he was advised not to take naproxen. Was sen by Dr.Martin    Patient also was stopped taking Effexor which was supposed to help him with his migraine headaches and now taking Excedrin over-the-counter daily I have encouraged the patient to resume taking Effexor, and if he does not have medication at home should call for refill, because when the patient was taking Effexor he had fewer migraine episode and less need to use  Analgesics.          Past Medical History:   Diagnosis Date   ??? Anemia    ??? Anxiety    ??? Chronic pain    ??? Cirrhosis of liver ([place])    ??? Diabetes ([place])    ??? GERD (gastroesophageal reflux disease)    ??? Hyperlipemia    ??? Hypertension    ??? Hypotension    ??? Migraine    ??? Other cirrhosis of liver ([place])    ??? Stroke [facility])     had 3 strokes    ??? Thrombocytopenia ([place])      Past Surgical History:   Procedure Laterality Date   ??? HX CHOLECYSTECTOMY     ??? HX ORTHOPAEDIC      R hand sx   ??? HX OTHER SURGICAL      Hand surgery- Nail gun went through finger     Social History     Tobacco Use   ??? Smoking status: Former Smoker   ??? Smokeless tobacco: Never Used   ??? Tobacco comment: quit years ago   Substance Use Topics   ??? Alcohol use: No       Family History   Problem Relation Age of Onset    ??? Alzheimer Mother    ??? Diabetes Mother    ??? Cancer Mother         colon cancer    ??? Hypertension Father    ??? Heart Disease Father    ??? Cancer Father         prostate, lung cancer    ??? Diabetes Sister    ??? Heart Disease Sister    ??? Diabetes Brother        Review of Systems   Constitutional: Negative for chills, fever, malaise/fatigue and weight loss.   HENT: Negative for congestion, ear discharge, ear pain, hearing loss, nosebleeds, sinus pain and sore throat.    Eyes: Negative for blurred vision, double vision and discharge.  Respiratory: Negative for cough, hemoptysis, sputum production, shortness of breath and wheezing.    Cardiovascular: Negative for chest pain, palpitations, claudication and leg swelling.   Gastrointestinal: Negative for abdominal pain, blood in stool, constipation, diarrhea, melena, nausea and vomiting.   Genitourinary: Negative for dysuria, flank pain, frequency, hematuria and urgency.   Musculoskeletal: Negative for myalgias.   Skin: Negative for itching and rash.   Neurological: Negative for dizziness, tingling, sensory change, speech change, focal weakness, weakness and headaches.   Psychiatric/Behavioral: Negative for depression, hallucinations, substance abuse and suicidal ideas. The patient is not nervous/anxious and does not have insomnia.        Physical Exam   Constitutional: He is oriented to person, place, and time. He appears well-developed and well-nourished. No distress.   HENT:   Head: Normocephalic and atraumatic.   Mouth/Throat: No oropharyngeal exudate.   Eyes: Pupils are equal, round, and reactive to light. Conjunctivae are normal. Right eye exhibits no discharge. Left eye exhibits no discharge. No scleral icterus.   Neck: Normal range of motion. Neck supple. No thyromegaly present.   Cardiovascular: Normal rate, regular rhythm and normal heart sounds.   Pulmonary/Chest: Effort normal and breath sounds normal. No respiratory distress. He has no rales.    Abdominal: Soft. Bowel sounds are normal. He exhibits no distension and no mass. There is no tenderness. There is no rebound.   Musculoskeletal: Normal range of motion. He exhibits no edema, tenderness or deformity.   Lymphadenopathy:     He has no cervical adenopathy.   Neurological: He is alert and oriented to person, place, and time. No cranial nerve deficit. Coordination normal.   Skin: Skin is warm and dry. No rash noted. He is not diaphoretic. No erythema.   Psychiatric: He has a normal mood and affect. Judgment and thought content normal.   Nursing note and vitals reviewed.       Assessment and plan     Plan of care has been discussed with the patient, he agrees to the plan and verbalized understanding.  All his questions were answered  More than 50% of the time spent in this visit was counseling the patient about  illness and treatment options         1. Anxiety   he has decreased his ativan to once a day instead of twice a day and he has not refilled his Ativan in 3 months    I have discussed this patient the next step that he will decrease his Ativan intake from 1 tablet to half tablet a day and the goal is to get him off Ativan eventually  \\  - LORazepam (ATIVAN) 1 mg tablet; Take 1 Tab by mouth every eight (8) hours as needed for Anxiety for up to 90 days. Max Daily Amount: 3 mg.  Dispense: 90 Tab; Refill: 0    2. Non-seasonal allergic rhinitis, unspecified trigger  Stable on current medications    3. Hyperlipidemia, unspecified hyperlipidemia type  He is on Lipitor 10 mg daily    4. NAFLD (nonalcoholic fatty liver disease)  Is stable and he follow-up with hematology    5. 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 Restless leg syndrome  Controlled on current medications Mirapex     8. 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 Other cirrhosis of liver (HCC)  Stable        Current Outpatient Medications   Medication Sig Dispense Refill   ??? LORazepam (ATIVAN) 1 mg tablet Take 1 Tab by mouth every eight (8) hours as needed for Anxiety for up to 90 days. Max Daily Amount: 3 mg. 90 Tab 0   ??? rosuvastatin (CRESTOR) 10 mg tablet TAKE 1 TABLET BY MOUTH NIGHTLY 90 Tab 0   ??? citalopram (CELEXA) 20 mg tablet TAKE 1 TABLET BY MOUTH DAILY FOR 2 WEEKS THEN TAKE 1 TABLET BY MOUTH EVERY OTHER DAY 58 Tab 0   ??? propranolol (INDERAL) 20 mg tablet TAKE 1 TABLET BY MOUTH THREE TIMES DAILY (Patient taking differently: Take 20 mg by mouth two (2) times a day.) 270 Tab 0   ??? prochlorperazine (COMPAZINE) 10 mg tablet TAKE 1/2 TABLET BY MOUTH EVERY 6 HOURS AS NEEDED 30 Tab 0   ??? sucralfate (CARAFATE) 1 gram tablet TAKE 1 TABLET BY MOUTH FOUR TIMES DAILY WITH MEALS AND AT BEDTIME 360 Tab 0   ??? gemfibrozil (LOPID) 600 mg tablet TAKE 1 TABLET BY MOUTH TWICE DAILY 180 Tab 0   ??? finasteride (PROSCAR) 5 mg tablet TAKE 1 TABLET BY MOUTH DAILY 90 Tab 0   ??? topiramate (TOPAMAX) 100 mg tablet TAKE 1 TABLET BY MOUTH DAILY 90 Tab 0   ??? metFORMIN (GLUCOPHAGE) 500 mg tablet TAKE 2 TABLETS BY MOUTH TWICE DAILY WITH MEALS(GENERIC FOR GLUCOPHAGE) (Patient taking differently: Take 500 mg by mouth two (2) times daily (with meals).) 360 Tab 0   ??? pramipexole (MIRAPEX) 0.125 mg tablet TAKE 1 TABLET BY MOUTH NIGHTLY 30 Tab 0   ??? lancets (ONETOUCH ULTRASOFT LANCETS) misc Use to test blood sugars four time daily. 1 Each 11   ??? glucose blood VI test strips (ASCENSIA AUTODISC VI, ONE TOUCH ULTRA TEST VI) strip Use to test blood sugars four times daily with Onetouch ultra 2. 400 Strip 3   ??? Insulin Needles, Disposable, (BD ULTRA-FINE SHORT PEN NEEDLE) 31 gauge x 5/16" ndle Use 4 times daily to inject insulin. 1 Package 11    ??? oxyCODONE IR (ROXICODONE) 10 mg tab immediate release tablet Take 10 mg by mouth every six (6) hours as needed for Pain.     ??? HUMULIN R REGULAR U-100 INSULN 100 unit/mL injection As needed per sliding scale 1 Vial 6   ??? Insulin Syringe-Needle U-100 1 mL 29 gauge x 1/2" syrg Three times per day and as needed as per sliding scale 100 Syringe 6   ??? cholecalciferol, vitamin D3, (VITAMIN D3) 2,000 unit tab Take  by mouth.     ??? B.infantis-B.ani-B.long-B.bifi (PROBIOTIC 4X) 10-15 mg TbEC Take  by mouth.     ??? cyclobenzaprine (FLEXERIL) 10 mg tablet Take  by mouth three (3) times daily as needed for Muscle Spasm(s).     ??? naloxone (NARCAN) 4 mg/actuation nasal spray Use 1 spray intranasally into 1 nostril. Use a new Narcan nasal spray for subsequent doses and administer into alternating nostrils. May repeat every 2 to 3 minutes as needed for opioid overdose symptoms. 1 Each 0   ??? aspirin 81 mg chewable tablet Take 81 mg by mouth daily.         Patient Active Problem List    Diagnosis Date Noted   ??? Peripheral vascular disease ([place]) 02/21/2018   ??? Restless leg  syndrome 12/04/2017   ??? Migraine without aura and without status migrainosus, not intractable 04/15/2017   ??? 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 Dermatitis 01/05/2017   ??? Benign prostatic hyperplasia (BPH) with straining on urination 12/31/2016   ??? Refractory anemia due to myelodysplastic syndrome ([place]) 12/22/2016   ??? Myelodysplastic syndrome ([place]) 12/22/2016   ??? Type 2 diabetes with nephropathy ([place]) 10/18/2016   ??? Iron deficiency anemia secondary to inadequate dietary iron intake 09/23/2016   ??? Chronic leukopenia 09/23/2016   ??? Thrombocytopenia ([place]) 09/23/2016   ??? Moderate major depression ([place]) 09/10/2016   ??? Type II diabetes mellitus ([place]) 08/03/2016   ??? HTN (hypertension) 08/03/2016   ??? 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 Stroke ([place]) 08/03/2016   ??? Compressed vertebrae 08/03/2016   ??? Chronic back pain greater than 3 months duration 08/03/2016    ??? Hyperlipemia 08/03/2016   ??? Anxiety 08/03/2016   ??? Depression 06/30/2016   ??? Iron deficiency anemia 04/22/2016   ??? NAFLD (nonalcoholic fatty liver disease) 04/22/2016   ??? Other cirrhosis of liver ([place]) 04/22/2016   ??? Chronic anemia 03/18/2016   ??? Controlled type 2 diabetes mellitus without complication, without long-term current use of insulin ([place]) 03/18/2016   ??? Essential hypertension 03/18/2016   ??? Hyperlipidemia 03/18/2016   ??? History of stroke 03/18/2016   ??? Anxiety 03/18/2016   ??? Chronic pain 03/18/2016     Results for orders placed or performed during the hospital encounter of 04/24/18   CBC WITH 3 PART DIFF   Result Value Ref Range    WBC 3.5 (L) 4.5 - 13.0 K/uL    RBC 3.97 (L) 4.10 - 5.10 M/uL    HGB 13.2 12.0 - 16.0 g/dL    HCT 38.4 36 - 48 %    MCV 96.7 78 - 102 FL    MCH 33.2 25.0 - 35.0 PG    MCHC 34.4 31 - 37 g/dL    RDW 13.7 11.5 - 14.5 %    PLATELET 61 (L) 140 - 440 K/uL    NEUTROPHILS 76 (H) 40 - 70 %    MIXED CELLS 10 0.1 - 17 %    LYMPHOCYTES 14 14 - 44 %    ABS. NEUTROPHILS 2.6 1.8 - 9.5 K/UL    ABS. MIXED CELLS 0.4 0.0 - 2.3 K/uL    ABS. LYMPHOCYTES 0.5 (L) 1.1 - 5.9 K/UL    [facility] AUTOMATED       Hospital Outpatient Visit on 04/24/2018   Component Date Value Ref Range Status   ??? WBC 04/24/2018 3.5* 4.5 - 13.0 K/uL Final   ??? RBC 04/24/2018 3.97* 4.10 - 5.10 M/uL Final   ??? HGB 04/24/2018 13.2  12.0 - 16.0 g/dL Final   ??? HCT 04/24/2018 38.4  36 - 48 % Final   ??? MCV 04/24/2018 96.7  78 - 102 FL Final   ??? [place] 04/24/2018 33.2  25.0 - 35.0 PG Final   ??? MCHC 04/24/2018 34.4  31 - 37 g/dL Final   ??? RDW 04/24/2018 13.7  11.5 - 14.5 % Final   ??? PLATELET 04/24/2018 61* 140 - 440 K/uL Final   ??? NEUTROPHILS 04/24/2018 76* 40 - 70 % Final   ??? MIXED CELLS 04/24/2018 10  0.1 - 17 % Final   ??? LYMPHOCYTES 04/24/2018 14  14 - 44 % Final   ??? ABS. NEUTROPHILS 04/24/2018 2.6  1.8 - 9.5 K/UL Final   ??? ABS. MIXED CELLS 04/24/2018 0.4  0.0 -  2.3 K/uL Final   ??? ABS. LYMPHOCYTES 04/24/2018 0.5* 1.1 - 5.9 K/UL Final     Test performed at [place] or Outpatient Infusion Center Location. Reviewed by Medical Director.   ??? DF 04/24/2018 AUTOMATED    Final   Hospital Outpatient Visit on 03/22/2018   Component Date Value Ref Range Status   ??? WBC 03/22/2018 3.4* 4.6 - 13.2 K/uL Final   ??? RBC 03/22/2018 3.95* 4.70 - 5.50 M/uL Final   ??? HGB 03/22/2018 12.5* 13.0 - 16.0 g/dL Final   ??? HCT 03/22/2018 38.9  36.0 - 48.0 % Final   ??? MCV 03/22/2018 98.5* 74.0 - 97.0 FL Final   ??? MCH 03/22/2018 31.6  24.0 - 34.0 PG Final   ??? MCHC 03/22/2018 32.1  31.0 - 37.0 g/dL Final   ??? RDW 03/22/2018 16.9* 11.6 - 14.5 % Final   ??? PLATELET 03/22/2018 78* 135 - 420 K/uL Final   ??? MPV 03/22/2018 12.2* 9.2 - 11.8 FL Final   ??? NEUTROPHILS 03/22/2018 77* 40 - 73 % Final   ??? LYMPHOCYTES 03/22/2018 14* 21 - 52 % Final   ??? MONOCYTES 03/22/2018 6  3 - 10 % Final   ??? EOSINOPHILS 03/22/2018 2  0 - 5 % Final   ??? BASOPHILS 03/22/2018 1  0 - 2 % Final   ??? ABS. NEUTROPHILS 03/22/2018 2.6  1.8 - 8.0 K/UL Final   ??? ABS. LYMPHOCYTES 03/22/2018 0.5* 0.9 - 3.6 K/UL Final   ??? ABS. MONOCYTES 03/22/2018 0.2  0.05 - 1.2 K/UL Final   ??? ABS. EOSINOPHILS 03/22/2018 0.1  0.0 - 0.4 K/UL Final   ??? ABS. BASOPHILS 03/22/2018 0.0  0.0 - 0.1 K/UL Final   ??? DF 03/22/2018 AUTOMATED    Final   ??? Iron 03/22/2018 70  50 - 175 ug/dL Final    Patients receiving metal-binding drugs (e.g. deferoxamine) may show spuriously depressed iron values, as chelated iron may not properly react in the iron assay.   ??? TIBC 03/22/2018 344  250 - 450 ug/dL Final   ??? Iron % saturation 03/22/2018 20  % Final   ??? Sodium 03/22/2018 140  136 - 145 mmol/L Final   ??? Potassium 03/22/2018 4.5  3.5 - 5.5 mmol/L Final   ??? Chloride 03/22/2018 110  100 - 111 mmol/L Final   ??? CO2 03/22/2018 22  21 - 32 mmol/L Final   ??? Anion gap 03/22/2018 8  3.0 - 18 mmol/L Final   ??? Glucose 03/22/2018 150* 74 - 99 mg/dL Final   ??? BUN 03/22/2018 18  7.0 - 18 MG/DL Final    ??? Creatinine 03/22/2018 1.03  0.6 - 1.3 MG/DL Final   ??? BUN/Creatinine ratio 03/22/2018 17  12 - 20   Final   ??? GFR est AA 03/22/2018 >60  >60 ml/min/1.80m Final   ??? GFR est non-AA 03/22/2018 >60  >60 ml/min/1.755mFinal    Comment: (NOTE)  Estimated GFR is calculated using the Modification of Diet in Renal   Disease (MDRD) Study equation, reported for both African Americans   (GFRAA) and non-African Americans (GFRNA), and normalized to 1.7347m body surface area. The physician must decide which value applies to   the patient. The MDRD study equation should only be used in   individuals age 36 37 older. It has not been validated for the   following: pregnant women, patients with serious comorbid conditions,   or on certain medications, or  persons with extremes of body size,   muscle mass, or nutritional status.     ??? Calcium 03/22/2018 9.4  8.5 - 10.1 MG/DL Final   ??? Bilirubin, total 03/22/2018 0.6  0.2 - 1.0 MG/DL Final   ??? ALT (SGPT) 03/22/2018 32  16 - 61 U/L Final   ??? AST (SGOT) 03/22/2018 28  10 - 38 U/L Final   ??? Alk. phosphatase 03/22/2018 227* 45 - 117 U/L Final   ??? Protein, total 03/22/2018 7.6  6.4 - 8.2 g/dL Final   ??? Albumin 03/22/2018 4.5  3.4 - 5.0 g/dL Final   ??? Globulin 03/22/2018 3.1  2.0 - 4.0 g/dL Final   ??? A-G Ratio 03/22/2018 1.5  0.8 - 1.7   Final   ??? Protein, total 03/22/2018 7.1  6.0 - 8.5 g/dL Final   ??? Albumin 03/22/2018 4.1  2.9 - 4.4 g/dL Final   ??? Alpha-1-globulin 03/22/2018 0.3  0.0 - 0.4 g/dL Final   ??? ALPHA-2 GLOBULIN 03/22/2018 0.9  0.4 - 1.0 g/dL Final   ??? Beta globulin 03/22/2018 1.0  0.7 - 1.3 g/dL Final   ??? Gamma globulin 03/22/2018 0.8  0.4 - 1.8 g/dL Final   ??? M-Spike 03/22/2018 Not Observed  Not Observed g/dL Final   ??? Globulin, total 03/22/2018 3.0  2.2 - 3.9 g/dL Final   ??? A/G ratio 03/22/2018 1.4  0.7 - 1.7   Final   ??? Ferritin 03/22/2018 274  8 - 388 NG/ML Final   Hospital Outpatient Visit on 03/01/2018   Component Date Value Ref Range Status    ??? WBC 03/01/2018 4.6  4.5 - 13.0 K/uL Final   ??? RBC 03/01/2018 3.99* 4.10 - 5.10 M/uL Final   ??? HGB 03/01/2018 12.9  12.0 - 16.0 g/dL Final   ??? HCT 03/01/2018 38.3  36 - 48 % Final   ??? MCV 03/01/2018 96.0  78 - 102 FL Final   ??? MCH 03/01/2018 32.3  25.0 - 35.0 PG Final   ??? MCHC 03/01/2018 33.7  31 - 37 g/dL Final   ??? RDW 03/01/2018 17.5* 11.5 - 14.5 % Final   ??? PLATELET 03/01/2018 73* 140 - 440 K/uL Final   ??? NEUTROPHILS 03/01/2018 82* 40 - 70 % Final   ??? MIXED CELLS 03/01/2018 8  0.1 - 17 % Final   ??? LYMPHOCYTES 03/01/2018 10* 14 - 44 % Final   ??? ABS. NEUTROPHILS 03/01/2018 3.7  1.8 - 9.5 K/UL Final   ??? ABS. MIXED CELLS 03/01/2018 0.4  0.0 - 2.3 K/uL Final   ??? ABS. LYMPHOCYTES 03/01/2018 0.5* 1.1 - 5.9 K/UL Final    Test performed at [place] or Outpatient Infusion Center Location. Reviewed by Medical Director.   ??? DF 03/01/2018 AUTOMATED    Final   Office Visit on 02/21/2018   Component Date Value Ref Range Status   ??? Cholesterol, total 02/21/2018 99* 100 - 199 mg/dL Final   ??? Triglyceride 02/21/2018 201* 0 - 149 mg/dL Final   ??? HDL Cholesterol 02/21/2018 35* >39 mg/dL Final   ??? VLDL, calculated 02/21/2018 40  5 - 40 mg/dL Final   ??? LDL, calculated 02/21/2018 24  0 - 99 mg/dL Final   ??? Creatinine, urine 02/21/2018 48.6  Not Estab. mg/dL Final   ??? Microalbumin, urine 02/21/2018 4.3  Not Estab. ug/mL Final   ??? Microalb/Creat ratio (ug/mg creat.) 02/21/2018 8.8  0.0 - 30.0 mg/g creat Final    Comment:  Normal:                0.0 -  30.0                       Albuminuria:          31.0 - 300.0                       Clinical albuminuria:       >300.0     ??? Hemoglobin A1c 02/21/2018 5.6  4.8 - 5.6 % Final    Comment:          Prediabetes: 5.7 - 6.4           Diabetes: >6.4           Glycemic control for adults with diabetes: <7.0     ??? INTERPRETATION 02/21/2018 Note   Final    Supplemental report is available.          Follow-up and Dispositions     ?? Return in about 3 months (around 08/16/2018).

## 2018-05-16 NOTE — Progress Notes (Addendum)
Verl Blalock Devol Sr. presents today for   Chief Complaint   Patient presents with   ??? Cholesterol Problem     f/u   ??? Diabetes     f/u   ??? Annual Wellness Visit       Is someone accompanying this pt? Yes, patient's wife    Is the patient using any DME equipment during [place]? Yes, walking cane    Depression Screening:  3 most recent PHQ Screens 03/22/2018   Little interest or pleasure in doing things Not at all   Feeling down, depressed, irritable, or hopeless Not at all   Total Score PHQ 2 0       Learning Assessment:  Learning Assessment 02/21/2018   PRIMARY LEARNER Patient   HIGHEST LEVEL OF EDUCATION - PRIMARY LEARNER  -   BARRIERS PRIMARY LEARNER -   CO-LEARNER CAREGIVER -   PRIMARY LANGUAGE ENGLISH   LEARNER PREFERENCE PRIMARY READING     LISTENING     VIDEOS   ANSWERED BY patient   RELATIONSHIP SELF       Abuse Screening:  Abuse Screening Questionnaire 03/22/2018   Do you ever feel afraid of your partner? N   Are you in a relationship with someone who physically or mentally threatens you? N   Is it safe for you to go home? Y       Fall Risk  Fall Risk Assessment, last 12 mths 02/21/2018   Able to walk? Yes   Fall in past 12 months? No   Fall with injury? -   Number of falls in past 12 months -   Fall Risk Score -       Health Maintenance reviewed and discussed and ordered per Provider.    Health Maintenance Due   Topic Date Due   ??? FOOT EXAM Q1  03/19/1956   ??? EYE EXAM RETINAL OR DILATED  03[birth year]19/1956   ??? GLAUCOMA SCREENING Q2Y  03/19/2013   ??? Pneumococcal 65+ years (1 of 1 - PPSV23) 03/19/2013   ??? MEDICARE YEARLY EXAM  09/11/2017   .    Patient is due for eye exam/glaucoma screening. I offered an appointment but states he will schedule on his own with someone who takes his insurance.    Pt currently taking Antiplatelet therapy? No    Coordination of Care:  1. Have you been to the ER, urgent care clinic since your last visit? Hospitalized since your last visit? No     2. Have you seen or consulted any other health care providers outside of the [place] since your last visit? Include any pap smears or colon screening. Yes, Dr. Nadyne Coombes and Dr. Shelly Bombard Escorcia, Sr. is a 72 y.o. male who presents for routine immunizations.   The patient denies any symptoms , reactions or allergies that would exclude them from being immunized today.  Risks and adverse reactions were discussed and the VIS was given to them. All questions were addressed.  The patient was observed for 10 minutes post injection. There were no reactions observed.  After obtaining informed consent, the immunization is given by Sheran Spine, CMA.
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
## 2018-05-22 ENCOUNTER — Inpatient Hospital Stay: Admit: 2018-05-22 | Payer: MEDICARE | Primary: Legal Medicine

## 2018-05-22 ENCOUNTER — Institutional Professional Consult (permissible substitution): Admit: 2018-05-22 | Discharge: 2018-05-22 | Payer: PRIVATE HEALTH INSURANCE | Primary: Legal Medicine

## 2018-05-22 ENCOUNTER — Inpatient Hospital Stay: Admit: 2018-05-22 | Primary: Legal Medicine

## 2018-05-22 DIAGNOSIS — D649 Anemia, unspecified: Secondary | ICD-10-CM

## 2018-05-22 DIAGNOSIS — D508 Other iron deficiency anemias: Secondary | ICD-10-CM

## 2018-05-22 LAB — CBC WITH 3 PART DIFF
ABS. LYMPHOCYTES: 0.5 10*3/uL — ABNORMAL LOW (ref 1.1–5.9)
ABS. MIXED CELLS: 0.4 10*3/uL (ref 0.0–2.3)
ABS. NEUTROPHILS: 2.4 10*3/uL (ref 1.8–9.5)
HCT: 37.2 % (ref 36–48)
HGB: 12.7 g/dL (ref 12.0–16.0)
LYMPHOCYTES: 15 % (ref 14–44)
MCH: 32.6 PG (ref 25.0–35.0)
MCHC: 34.1 g/dL (ref 31–37)
MCV: 95.6 FL (ref 78–102)
Mixed cells: 12 % (ref 0.1–17)
NEUTROPHILS: 73 % — ABNORMAL HIGH (ref 40–70)
PLATELET: 78 10*3/uL — ABNORMAL LOW (ref 140–440)
RBC: 3.89 M/uL — ABNORMAL LOW (ref 4.10–5.10)
RDW: 13.3 % (ref 11.5–14.5)
WBC: 3.3 10*3/uL — ABNORMAL LOW (ref 4.5–13.0)

## 2018-05-22 NOTE — Progress Notes (Signed)
[facility] Progress Note    Date: May 22, 2018    Name: William Dotson [facility] Sr.    MRN: [id number]         DOB: 11-03-1945      William Blanchard was assessed and education was provided.     William Blanchard vitals were reviewed and patient was observed for 5 minutes prior to treatment.   Visit Vitals  BP 97/59 (BP 1 Location: Left arm, BP Patient Position: Sitting)   Temp 97.9 ??F (36.6 ??C)   Resp 16   SpO2 100%       Lab results were obtained and reviewed.  Recent Results (from the past 12 hour(s))   CBC WITH 3 PART DIFF    Collection Time: 05/22/18  3:15 PM   Result Value Ref Range    WBC 3.3 (L) 4.5 - 13.0 K/uL    RBC 3.89 (L) 4.10 - 5.10 M/uL    HGB 12.7 12.0 - 16.0 g/dL    HCT 37.2 36 - 48 %    MCV 95.6 78 - 102 FL    MCH 32.6 25.0 - 35.0 PG    MCHC 34.1 31 - 37 g/dL    RDW 13.3 11.5 - 14.5 %    PLATELET 78 (L) 140 - 440 K/uL    NEUTROPHILS 73 (H) 40 - 70 %    MIXED CELLS 12 0.1 - 17 %    LYMPHOCYTES 15 14 - 44 %    ABS. NEUTROPHILS 2.4 1.8 - 9.5 K/UL    ABS. MIXED CELLS 0.4 0.0 - 2.3 K/uL    ABS. LYMPHOCYTES 0.5 (L) 1.1 - 5.9 K/UL    DF AUTOMATED         Retacrit not given for H&H 12.7/37.2.     Patient armband removed and shredded.    William Blanchard will skip the next appt due to the holidays.  I offered an appt before 07/17/18 however, they declined.    William Blanchard was discharged from [facility] in stable condition at 1530. He is to return on 07/17/18 at 1400 for his next appointment for CBC/Retacrit Q 2 wks.      Lynnda Shields, RN  May 22, 2018

## 2018-06-08 MED ORDER — GENERLAC 10 GRAM/15 ML ORAL SOLUTION
10 gram/15 mL | ORAL | 0 refills | Status: DC
Start: 2018-06-08 — End: 2018-09-20

## 2018-06-14 ENCOUNTER — Ambulatory Visit
Admit: 2018-06-14 | Discharge: 2018-06-14 | Payer: PRIVATE HEALTH INSURANCE | Attending: Hematology & Oncology | Primary: Legal Medicine

## 2018-06-14 ENCOUNTER — Inpatient Hospital Stay: Admit: 2018-06-14 | Primary: Legal Medicine

## 2018-06-14 DIAGNOSIS — D649 Anemia, unspecified: Secondary | ICD-10-CM

## 2018-06-14 LAB — CBC WITH 3 PART DIFF
ABS. LYMPHOCYTES: 0.5 10*3/uL — ABNORMAL LOW (ref 1.1–5.9)
ABS. MIXED CELLS: 0.4 10*3/uL (ref 0.0–2.3)
ABS. NEUTROPHILS: 3 10*3/uL (ref 1.8–9.5)
HCT: 38.6 % (ref 36–48)
HGB: 13 g/dL (ref 12.0–16.0)
LYMPHOCYTES: 13 % — ABNORMAL LOW (ref 14–44)
MCH: 32.4 PG (ref 25.0–35.0)
MCHC: 33.7 g/dL (ref 31–37)
MCV: 96.3 FL (ref 78–102)
Mixed cells: 10 % (ref 0.1–17)
NEUTROPHILS: 77 % — ABNORMAL HIGH (ref 40–70)
PLATELET: 79 10*3/uL — ABNORMAL LOW (ref 140–440)
RBC: 4.01 M/uL — ABNORMAL LOW (ref 4.10–5.10)
RDW: 12.9 % (ref 11.5–14.5)
WBC: 3.9 10*3/uL — ABNORMAL LOW (ref 4.5–13.0)

## 2018-06-14 NOTE — Progress Notes (Signed)
Results reviewed and noted. Will continue to monitor.

## 2018-06-14 NOTE — Progress Notes (Signed)
Hematology/Oncology  Progress Note    Name: William Blanchard.  Date: 06/14/2018  DOB: 14-Mar-1946    PCP: [name], MD     [name] is a 72 y.o.-year-old man who has myelodysplastic syndrome, chronic leukopenia, and severe anemia.  Current therapy: Procrit 60,000 units every 2 weeks when the hemoglobin is below 10 g/dL.    Subjective:     [name] is a 72 year old man who has myelodysplastic syndrome with severe anemia.  He also has chronic leukopenia and thrombocytopenia.  He is currently receiving Procrit 60,000 units every 2 weeks. His wife reports he is eating well and sleeping better. He denies tiredness, weakness, and fatigue. He denies shortness of breath, chest pain, and dizziness. He does not have any concerns or complaints to report at this time.  Since his last clinic visit he received 4 doses of intravenous Venofer after he was found to have a ferritin level of 8 ng/mL.  The iron therapy was tolerated well. Patient is here for follow up. His wife is with him today. Wife said that patient is no longer sleepy. She said that they  were able to put up a christmas tree this year. She also said that he seldomly takes the anti-anxiety medication. He denies having fever, night sweat, skin rashes and recent infection.He has no other physical complaints to report at this time.      Past medical history, family history, and social history: these were reviewed and remains unchanged.    Past Medical History:   Diagnosis Date   ??? Anemia    ??? Anxiety    ??? Chronic pain    ??? Cirrhosis of liver ([place])    ??? Diabetes ([place])    ??? GERD (gastroesophageal reflux disease)    ??? Hyperlipemia    ??? Hypertension    ??? Hypotension    ??? Migraine    ??? Other cirrhosis of liver ([place])    ??? Stroke [facility])     had 3 strokes    ??? Thrombocytopenia ([place])      Past Surgical History:   Procedure Laterality Date   ??? HX CHOLECYSTECTOMY     ??? HX ORTHOPAEDIC      R hand sx   ??? HX OTHER SURGICAL       Hand surgery- Nail gun went through finger     Social History     Socioeconomic History   ??? Marital status: MARRIED     Spouse name: Not on file   ??? Number of children: Not on file   ??? Years of education: Not on file   ??? Highest education level: Not on file   Occupational History   ??? Not on file   Social Needs   ??? Financial resource strain: Not on file   ??? Food insecurity:     Worry: Not on file     Inability: Not on file   ??? Transportation needs:     Medical: Not on file     Non-medical: Not on file   Tobacco Use   ??? Smoking status: Former Smoker   ??? Smokeless tobacco: Never Used   ??? Tobacco comment: quit years ago   Substance and Sexual Activity   ??? Alcohol use: No   ??? Drug use: No   ??? Sexual activity: Never     Partners: Male   Lifestyle   ??? Physical activity:     Days per week: Not on file  Minutes per session: Not on file   ??? Stress: Not on file   Relationships   ??? Social connections:     Talks on phone: Not on file     Gets together: Not on file     Attends religious service: Not on file     Active member of club or organization: Not on file     Attends meetings of clubs or organizations: Not on file     Relationship status: Not on file   ??? Intimate partner violence:     Fear of current or ex partner: Not on file     Emotionally abused: Not on file     Physically abused: Not on file     Forced sexual activity: Not on file   Other Topics Concern   ??? Not on file   Social History Narrative    ** Merged History Encounter **          Family History   Problem Relation Age of Onset   ??? Alzheimer Mother    ??? Diabetes Mother    ??? Cancer Mother         colon cancer    ??? Hypertension Father    ??? Heart Disease Father    ??? Cancer Father         prostate, lung cancer    ??? Diabetes Sister    ??? Heart Disease Sister    ??? Diabetes Brother      Current Outpatient Medications   Medication Sig Dispense Refill   ??? GENERLAC 10 gram/15 mL solution TAKE 30ML BY MOUTH TWICE DAILY 5676 mL 0    ??? LORazepam (ATIVAN) 1 mg tablet Take 1 Tab by mouth every eight (8) hours as needed for Anxiety for up to 90 days. Max Daily Amount: 3 mg. 90 Tab 0   ??? rosuvastatin (CRESTOR) 10 mg tablet TAKE 1 TABLET BY MOUTH NIGHTLY 90 Tab 0   ??? citalopram (CELEXA) 20 mg tablet TAKE 1 TABLET BY MOUTH DAILY FOR 2 WEEKS THEN TAKE 1 TABLET BY MOUTH EVERY OTHER DAY 58 Tab 0   ??? propranolol (INDERAL) 20 mg tablet TAKE 1 TABLET BY MOUTH THREE TIMES DAILY (Patient taking differently: Take 20 mg by mouth two (2) times a day.) 270 Tab 0   ??? prochlorperazine (COMPAZINE) 10 mg tablet TAKE 1/2 TABLET BY MOUTH EVERY 6 HOURS AS NEEDED 30 Tab 0   ??? sucralfate (CARAFATE) 1 gram tablet TAKE 1 TABLET BY MOUTH FOUR TIMES DAILY WITH MEALS AND AT BEDTIME 360 Tab 0   ??? gemfibrozil (LOPID) 600 mg tablet TAKE 1 TABLET BY MOUTH TWICE DAILY 180 Tab 0   ??? finasteride (PROSCAR) 5 mg tablet TAKE 1 TABLET BY MOUTH DAILY 90 Tab 0   ??? topiramate (TOPAMAX) 100 mg tablet TAKE 1 TABLET BY MOUTH DAILY 90 Tab 0   ??? metFORMIN (GLUCOPHAGE) 500 mg tablet TAKE 2 TABLETS BY MOUTH TWICE DAILY WITH MEALS(GENERIC FOR GLUCOPHAGE) (Patient taking differently: Take 500 mg by mouth two (2) times daily (with meals).) 360 Tab 0   ??? pramipexole (MIRAPEX) 0.125 mg tablet TAKE 1 TABLET BY MOUTH NIGHTLY 30 Tab 0   ??? lancets (ONETOUCH ULTRASOFT LANCETS) misc Use to test blood sugars four time daily. 1 Each 11   ??? glucose blood VI test strips (ASCENSIA AUTODISC VI, ONE TOUCH ULTRA TEST VI) strip Use to test blood sugars four times daily with Onetouch ultra 2. 400 Strip 3   ??? Insulin Needles,  Disposable, (BD ULTRA-FINE SHORT PEN NEEDLE) 31 gauge x 5/16" ndle Use 4 times daily to inject insulin. 1 Package 11   ??? oxyCODONE IR (ROXICODONE) 10 mg tab immediate release tablet Take 10 mg by mouth every six (6) hours as needed for Pain.     ??? HUMULIN R REGULAR U-100 INSULN 100 unit/mL injection As needed per sliding scale 1 Vial 6    ??? Insulin Syringe-Needle U-100 1 mL 29 gauge x 1/2" syrg Three times per day and as needed as per sliding scale 100 Syringe 6   ??? naloxone (NARCAN) 4 mg/actuation nasal spray Use 1 spray intranasally into 1 nostril. Use a new Narcan nasal spray for subsequent doses and administer into alternating nostrils. May repeat every 2 to 3 minutes as needed for opioid overdose symptoms. 1 Each 0   ??? aspirin 81 mg chewable tablet Take 81 mg by mouth daily.     ??? cholecalciferol, vitamin D3, (VITAMIN D3) 2,000 unit tab Take  by mouth.     ??? B.infantis-B.ani-B.long-B.bifi (PROBIOTIC 4X) 10-15 mg TbEC Take  by mouth.     ??? cyclobenzaprine (FLEXERIL) 10 mg tablet Take  by mouth three (3) times daily as needed for Muscle Spasm(s).         Review of Systems  Constitutional: The patient has no acute distress or discomfort.  HEENT: The patient denies recent head trauma, eye pain, blurred vision,  hearing deficit, oropharyngeal mucosal pain or lesions, and the patient denies throat pain or discomfort.  Lymphatics: The patient denies palpable peripheral lymphadenopathy.  Hematologic: The patient denies having bruising, bleeding, or progressive fatigue.  Respiratory: Patient denies having shortness of breath, cough, sputum production, fever, or dyspnea on exertion.  Cardiovascular: The patient denies having leg pain, leg swelling, heart palpitations, chest permit, chest pain, or lightheadedness.  The patient denies having dyspnea on exertion.  Gastrointestinal: The patient denies having nausea, emesis, or diarrhea. The patient denies having any hematemesis or blood in the stool.  Genitourinary: Patient denies having urinary urgency, frequency, or dysuria.  The patient denies having blood in the urine.  Psychological: The patient denies having symptoms of nervousness, anxiety, depression, or thoughts of harming self.  Skin: Patient denies having skin rashes, skin, ulcerations, or unexplained itching or pruritus.   Musculoskeletal: The patient denies having pain in the joints or bones.      Objective:     Visit Vitals  BP 111/66   Pulse 66   Temp 97.7 ??F (36.5 ??C) (Oral)   Resp 16   Ht 5\' 9"  (1.753 m)   Wt 58.8 kg (129 lb 9.6 oz)   SpO2 99%   BMI 19.14 kg/m??     ECOG PS=0  Physical Exam:   Gen. Appearance: The patient is in no acute distress.  Skin: There is no bruise or rash.  HEENT: The exam is unremarkable.  Neck: Supple without lymphadenopathy or thyromegaly.  Lungs: Clear to auscultation and percussion; there are no wheezes or rhonchi.  Heart: Regular rate and rhythm; there are no murmurs, gallops, or rubs.  Abdomen: Bowel sounds are present and normal.  There is no guarding, tenderness, or hepatosplenomegaly.  Extremities: There is no clubbing, cyanosis, or edema.  Neurologic: There are no focal neurologic deficits.  Lymphatics: There is no palpable peripheral lymphadenopathy. Musculoskeletal: The patient has full range of motion at all joints.  There is no evidence of joint deformity or effusions.  There is no focal joint tenderness.  Psychological/psychiatric: There is no  clinical evidence of anxiety, depression, or melancholy.    Lab data:      Results for orders placed or performed during the hospital encounter of 06/14/18   CBC WITH 3 PART DIFF     Status: Abnormal   Result Value Ref Range Status    WBC 3.9 (L) 4.5 - 13.0 K/uL Final    RBC 4.01 (L) 4.10 - 5.10 M/uL Final    HGB 13.0 12.0 - 16.0 g/dL Final    HCT 38.6 36 - 48 % Final    MCV 96.3 78 - 102 FL Final    MCH 32.4 25.0 - 35.0 PG Final    MCHC 33.7 31 - 37 g/dL Final    RDW 12.9 11.5 - 14.5 % Final    PLATELET 79 (L) 140 - 440 K/uL Final    NEUTROPHILS 77 (H) 40 - 70 % Final    MIXED CELLS 10 0.1 - 17 % Final    LYMPHOCYTES 13 (L) 14 - 44 % Final    ABS. NEUTROPHILS 3.0 1.8 - 9.5 K/UL Final    ABS. MIXED CELLS 0.4 0.0 - 2.3 K/uL Final    ABS. LYMPHOCYTES 0.5 (L) 1.1 - 5.9 K/UL Final     Comment: Test performed at [place] or [facility] Location. Reviewed by Medical Director.    DF AUTOMATED   Final           Assessment:     1. Chronic anemia    2. Myelodysplastic syndrome ([place])    3. Thrombocytopenia, unspecified ([place])    4. Chronic leukopenia      Plan:     Thrombocytopenia: The patient has a stable platelet count of 79,000 K/uL. No therapeutic intervention is warranted.    Chronic leukopenia: The CBC from today shows that his WBC count is 3.9 K/uL with an absolute neutrophil count which is normal at 3.0 K/UL. However the absolute lymphocyte count is low at 0.5 K/UL. We will continue to monitor every 2 months. Therapeutic intervention is not warranted. Infection prevention was discussed with patient and his wife.      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  This is a significant improvement after receiving intravenous iron for 4 doses in the form of Venofer.  I will check his iron profile and ferritin levels at this time along with comprehensive metabolic panel. If his ferritin level is below 25 we will give him intravenous iron to replenish his ferritin level. Patient is currently being monitored for Procrit injection every 2 weeks. Patient said that he has not had Procrit injection since February, 2019, therefore, assessment for Procrit injection was changed from every 2 weeks to every 4 weeks. Patient is aware that if his hemoglobin and hematocrit go below 10 g/dL and 30 % respectively, he will be given Procrit 60,000 units.      Follow-up in 3 months or sooner if indicated.    Orders Placed This Encounter   ??? COMPLETE CBC & AUTO DIFF WBC   ??? InHouse CBC ([facility])     Standing Status:   Future     Number of Occurrences:   1     Standing Expiration Date:   06/21/2018   ??? IRON PROFILE     Standing Status:   Future      Standing  Expiration Date:   94/85/4627   ??? METABOLIC PANEL, COMPREHENSIVE     Standing Status:   Future     Standing Expiration Date:   06/15/2019   ??? FERRITIN     Standing Status:   Future     Standing Expiration Date:   06/15/2019   ??? SPEP     Standing Status:   Future     Standing Expiration Date:   06/15/2019         Nicki Guadalajara, NP  06/14/2018       I have assessed the patient independently and  agree with the full assessment as outlined.  Joycelyn Das, MD, FACP                                                                                        Hematology/Oncology  Progress Note    Name: William Blanchard.  Date: 06/14/2018  DOB: 1946/03/05    PCP: [name], MD     Mr. Navarrette is a 72 y.o.-year-old man who has myelodysplastic syndrome, chronic leukopenia, and severe anemia.  Current therapy: Procrit 60,000 units every 2 weeks when the hemoglobin is below 10 g/dL.    Subjective:     Mr. Grunewald is a 72 year old man who has myelodysplastic syndrome with severe anemia.  He also has chronic leukopenia and thrombocytopenia.  He is currently receiving Procrit 60,000 units every 2 weeks.  Today he is complaining of some fatigue and weakness.  His wife reports that although he is eating well he is continuing to lose weight and he is hypersomnolent, sleeping all of the time.    Past medical history, family history, and social history: these were reviewed and remains unchanged.    Past Medical History:   Diagnosis Date   ??? Anemia    ??? Anxiety    ??? Chronic pain    ??? Cirrhosis of liver ([place])    ??? Diabetes ([place])    ??? GERD (gastroesophageal reflux disease)    ??? Hyperlipemia    ??? Hypertension    ??? Hypotension    ??? Migraine    ??? Other cirrhosis of liver ([place])    ??? Stroke [facility])     had 3 strokes    ??? Thrombocytopenia ([place])      Past Surgical History:   Procedure Laterality Date   ??? HX CHOLECYSTECTOMY     ??? HX ORTHOPAEDIC      R hand sx   ??? HX OTHER SURGICAL      Hand surgery- Nail gun went through finger      Social History     Socioeconomic History   ??? Marital status: MARRIED     Spouse name: Not on file   ??? Number of children: Not on file   ??? Years of education: Not on file   ??? Highest education level: Not on file   Occupational History   ??? Not on file   Social Needs   ??? Financial resource strain: Not on file   ??? Food insecurity:     Worry: Not on file     Inability:  Not on file   ??? Transportation needs:     Medical: Not on file     Non-medical: Not on file   Tobacco Use   ??? Smoking status: Former Smoker   ??? Smokeless tobacco: Never Used   ??? Tobacco comment: quit years ago   Substance and Sexual Activity   ??? Alcohol use: No   ??? Drug use: No   ??? Sexual activity: Never     Partners: Male   Lifestyle   ??? Physical activity:     Days per week: Not on file     Minutes per session: Not on file   ??? Stress: Not on file   Relationships   ??? Social connections:     Talks on phone: Not on file     Gets together: Not on file     Attends religious service: Not on file     Active member of club or organization: Not on file     Attends meetings of clubs or organizations: Not on file     Relationship status: Not on file   ??? Intimate partner violence:     Fear of current or ex partner: Not on file     Emotionally abused: Not on file     Physically abused: Not on file     Forced sexual activity: Not on file   Other Topics Concern   ??? Not on file   Social History Narrative    ** Merged History Encounter **          Family History   Problem Relation Age of Onset   ??? Alzheimer Mother    ??? Diabetes Mother    ??? Cancer Mother         colon cancer    ??? Hypertension Father    ??? Heart Disease Father    ??? Cancer Father         prostate, lung cancer    ??? Diabetes Sister    ??? Heart Disease Sister    ??? Diabetes Brother      Current Outpatient Medications   Medication Sig Dispense Refill   ??? GENERLAC 10 gram/15 mL solution TAKE 30ML BY MOUTH TWICE DAILY 5676 mL 0   ??? LORazepam (ATIVAN) 1 mg tablet Take 1 Tab by mouth every eight (8) hours  as needed for Anxiety for up to 90 days. Max Daily Amount: 3 mg. 90 Tab 0   ??? rosuvastatin (CRESTOR) 10 mg tablet TAKE 1 TABLET BY MOUTH NIGHTLY 90 Tab 0   ??? citalopram (CELEXA) 20 mg tablet TAKE 1 TABLET BY MOUTH DAILY FOR 2 WEEKS THEN TAKE 1 TABLET BY MOUTH EVERY OTHER DAY 58 Tab 0   ??? propranolol (INDERAL) 20 mg tablet TAKE 1 TABLET BY MOUTH THREE TIMES DAILY (Patient taking differently: Take 20 mg by mouth two (2) times a day.) 270 Tab 0   ??? prochlorperazine (COMPAZINE) 10 mg tablet TAKE 1/2 TABLET BY MOUTH EVERY 6 HOURS AS NEEDED 30 Tab 0   ??? sucralfate (CARAFATE) 1 gram tablet TAKE 1 TABLET BY MOUTH FOUR TIMES DAILY WITH MEALS AND AT BEDTIME 360 Tab 0   ??? gemfibrozil (LOPID) 600 mg tablet TAKE 1 TABLET BY MOUTH TWICE DAILY 180 Tab 0   ??? finasteride (PROSCAR) 5 mg tablet TAKE 1 TABLET BY MOUTH DAILY 90 Tab 0   ??? topiramate (TOPAMAX) 100 mg tablet TAKE 1 TABLET BY MOUTH DAILY 90 Tab 0   ??? metFORMIN (GLUCOPHAGE) 500 mg  tablet TAKE 2 TABLETS BY MOUTH TWICE DAILY WITH MEALS(GENERIC FOR GLUCOPHAGE) (Patient taking differently: Take 500 mg by mouth two (2) times daily (with meals).) 360 Tab 0   ??? pramipexole (MIRAPEX) 0.125 mg tablet TAKE 1 TABLET BY MOUTH NIGHTLY 30 Tab 0   ??? lancets (ONETOUCH ULTRASOFT LANCETS) misc Use to test blood sugars four time daily. 1 Each 11   ??? glucose blood VI test strips (ASCENSIA AUTODISC VI, ONE TOUCH ULTRA TEST VI) strip Use to test blood sugars four times daily with Onetouch ultra 2. 400 Strip 3   ??? Insulin Needles, Disposable, (BD ULTRA-FINE SHORT PEN NEEDLE) 31 gauge x 5/16" ndle Use 4 times daily to inject insulin. 1 Package 11   ??? oxyCODONE IR (ROXICODONE) 10 mg tab immediate release tablet Take 10 mg by mouth every six (6) hours as needed for Pain.     ??? HUMULIN R REGULAR U-100 INSULN 100 unit/mL injection As needed per sliding scale 1 Vial 6   ??? Insulin Syringe-Needle U-100 1 mL 29 gauge x 1/2" syrg Three times per day and as needed as per sliding scale 100 Syringe 6    ??? naloxone (NARCAN) 4 mg/actuation nasal spray Use 1 spray intranasally into 1 nostril. Use a new Narcan nasal spray for subsequent doses and administer into alternating nostrils. May repeat every 2 to 3 minutes as needed for opioid overdose symptoms. 1 Each 0   ??? aspirin 81 mg chewable tablet Take 81 mg by mouth daily.     ??? cholecalciferol, vitamin D3, (VITAMIN D3) 2,000 unit tab Take  by mouth.     ??? B.infantis-B.ani-B.long-B.bifi (PROBIOTIC 4X) 10-15 mg TbEC Take  by mouth.     ??? cyclobenzaprine (FLEXERIL) 10 mg tablet Take  by mouth three (3) times daily as needed for Muscle Spasm(s).         Review of Systems  Constitutional: The patient has no acute distress or discomfort.  HEENT: The patient denies recent head trauma, eye pain, blurred vision,  hearing deficit, oropharyngeal mucosal pain or lesions, and the patient denies throat pain or discomfort.  Lymphatics: The patient denies palpable peripheral lymphadenopathy.  Hematologic: The patient denies having bruising, bleeding, or progressive fatigue.  Respiratory: Patient denies having shortness of breath, cough, sputum production, fever, or dyspnea on exertion.  Cardiovascular: The patient denies having leg pain, leg swelling, heart palpitations, chest permit, chest pain, or lightheadedness.  The patient denies having dyspnea on exertion.  Gastrointestinal: The patient denies having nausea, emesis, or diarrhea. The patient denies having any hematemesis or blood in the stool.  Genitourinary: Patient denies having urinary urgency, frequency, or dysuria.  The patient denies having blood in the urine.  Psychological: The patient denies having symptoms of nervousness, anxiety, depression, or thoughts of harming self.  Skin: Patient denies having skin rashes, skin, ulcerations, or unexplained itching or pruritus.  Musculoskeletal: The patient denies having pain in the joints or bones.      Objective:     Visit Vitals  BP 111/66   Pulse 66    Temp 97.7 ??F (36.5 ??C) (Oral)   Resp 16   Ht 5\' 9"  (1.753 m)   Wt 58.8 kg (129 lb 9.6 oz)   SpO2 99%   BMI 19.14 kg/m??     ECOG PS=0  Physical Exam:   Gen. Appearance: The patient is in no acute distress.  Skin: There is no bruise or rash.  HEENT: The exam is unremarkable.  Neck: Supple without lymphadenopathy  or thyromegaly.  Lungs: Clear to auscultation and percussion; there are no wheezes or rhonchi.  Heart: Regular rate and rhythm; there are no murmurs, gallops, or rubs.  Abdomen: Bowel sounds are present and normal.  There is no guarding, tenderness, or hepatosplenomegaly.  Extremities: There is no clubbing, cyanosis, or edema.  Neurologic: There are no focal neurologic deficits.  Lymphatics: There is no palpable peripheral lymphadenopathy. Musculoskeletal: The patient has full range of motion at all joints.  There is no evidence of joint deformity or effusions.  There is no focal joint tenderness.  Psychological/psychiatric: There is no clinical evidence of anxiety, depression, or melancholy.    Lab data:      Results for orders placed or performed during the hospital encounter of 06/14/18   CBC WITH 3 PART DIFF     Status: Abnormal   Result Value Ref Range Status    WBC 3.9 (L) 4.5 - 13.0 K/uL Final    RBC 4.01 (L) 4.10 - 5.10 M/uL Final    HGB 13.0 12.0 - 16.0 g/dL Final    HCT 38.6 36 - 48 % Final    MCV 96.3 78 - 102 FL Final    MCH 32.4 25.0 - 35.0 PG Final    MCHC 33.7 31 - 37 g/dL Final    RDW 12.9 11.5 - 14.5 % Final    PLATELET 79 (L) 140 - 440 K/uL Final    NEUTROPHILS 77 (H) 40 - 70 % Final    MIXED CELLS 10 0.1 - 17 % Final    LYMPHOCYTES 13 (L) 14 - 44 % Final    ABS. NEUTROPHILS 3.0 1.8 - 9.5 K/UL Final    ABS. MIXED CELLS 0.4 0.0 - 2.3 K/uL Final    ABS. LYMPHOCYTES 0.5 (L) 1.1 - 5.9 K/UL Final     Comment: Test performed at [place] or [facility] Location. Reviewed by Medical Director.    DF AUTOMATED   Final           Assessment:      1. Chronic anemia    2. Myelodysplastic syndrome ([place])    3. Thrombocytopenia, unspecified ([place])    4. 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  He did receive Procrit at a dose of 60,000 units yesterday.  I will check his iron profile and ferritin levels at this time.  If his ferritin level is below 100 and we will need to give him intravenous iron to replenish his ferritin level.    Chronic leukopenia: The CBC from today shows that his WBC count is 3.3 with an absolute neutrophil count which is normal at 2.6.  However the absolute lymphocyte count is low at 0.5.  These will continue to be monitored.  Therapeutic intervention is not warranted.    Thrombocytopenia: The patient has a stable lytic count of 85,000.  No therapeutic intervention is warranted.    Follow-up in 2 months  Orders Placed This Encounter   ??? COMPLETE CBC & AUTO DIFF WBC   ??? InHouse CBC ([facility])     Standing Status:   Future     Number of Occurrences:   1     Standing Expiration Date:   06/21/2018   ??? IRON PROFILE     Standing Status:  Future     Standing Expiration Date:   85/63/1497   ??? METABOLIC PANEL, COMPREHENSIVE     Standing Status:   Future     Standing Expiration Date:   06/15/2019   ??? FERRITIN     Standing Status:   Future     Standing Expiration Date:   06/15/2019   ??? SPEP     Standing Status:   Future     Standing Expiration Date:   06/15/2019       Nicki Guadalajara, NP  06/14/2018      Please note: This document has been produced using voice recognition software.  Unrecognized errors in transcription may be present.

## 2018-06-14 NOTE — Patient Instructions (Signed)
Anemia: Care Instructions  Your Care Instructions    Anemia is a low level of red blood cells, which carry oxygen throughout your body. Many things can cause anemia. Lack of iron is one of the most common causes. Your body needs iron to make hemoglobin, a substance in red blood cells that carries oxygen from the lungs to your body's cells. Without enough iron, the body produces fewer and smaller red blood cells. As a result, your body's cells do not get enough oxygen, and you feel tired and weak. And you may have trouble concentrating.  Bleeding is the most common cause of a lack of iron. You may have heavy menstrual bleeding or bleeding caused by conditions such as ulcers, hemorrhoids, or cancer. Regular use of aspirin or other anti-inflammatory medicines (such as ibuprofen) also can cause bleeding in some people. A lack of iron in your diet also can cause anemia, especially at times when the body needs more iron, such as during pregnancy, infancy, and the teen years.  Your doctor may have prescribed iron pills. It may take several months of treatment for your iron levels to return to normal. Your doctor also may suggest that you eat foods that are rich in iron, such as meat and beans.  There are many other causes of anemia. It is not always due to a lack of iron. Finding the specific cause of your anemia will help your doctor find the right treatment for you.  Follow-up care is a key part of your treatment and safety. Be sure to make and go to all appointments, and call your doctor if you are having problems. It's also a good idea to know your test results and keep a list of the medicines you take.  How can you care for yourself at home?  ?? Take your medicines exactly as prescribed. Call your doctor if you think you are having a problem with your medicine.  ?? If your doctor recommends iron pills, take them as directed:  ? Try to take the pills on an empty stomach about 1 hour before or 2 hours  after meals. But you may need to take iron with food to avoid an upset stomach.  ? Do not take antacids or drink milk or caffeine drinks (such as coffee, tea, or cola) at the same time or within 2 hours of the time that you take your iron. They can make it hard for your body to absorb the iron.  ? Vitamin C (from food or supplements) helps your body absorb iron. Try taking iron pills with a glass of orange juice or some other food that is high in vitamin C, such as citrus fruits.  ? Iron pills may cause stomach problems, such as heartburn, nausea, diarrhea, constipation, and cramps. Be sure to drink plenty of fluids, and include fruits, vegetables, and fiber in your diet each day. Iron pills often make your bowel movements dark or green.  ? If you forget to take an iron pill, do not take a double dose of iron the next time you take a pill.  ? Keep iron pills out of the reach of small children. An overdose of iron can be very dangerous.  ?? Follow your doctor's advice about eating iron-rich foods. These include red meat, shellfish, poultry, eggs, beans, raisins, whole-grain bread, and leafy green vegetables.  ?? Steam vegetables to help them keep their iron content.  When should you call for help?  Call 911 anytime you think you   may need emergency care. For example, call if:  ?? ?? You have symptoms of a heart attack. These may include:  ? Chest pain or pressure, or a strange feeling in the chest.  ? Sweating.  ? Shortness of breath.  ? Nausea or vomiting.  ? Pain, pressure, or a strange feeling in the back, neck, jaw, or upper belly or in one or both shoulders or arms.  ? Lightheadedness or sudden weakness.  ? A fast or irregular heartbeat.  After you call 911, the operator may tell you to chew 1 adult-strength or 2 to 4 low-dose aspirin. Wait for an ambulance. Do not try to drive yourself.   ?? ?? You passed out (lost consciousness).   ??Call your doctor now or seek immediate medical care if:   ?? ?? You have new or increased shortness of breath.   ?? ?? You are dizzy or lightheaded, or you feel like you may faint.   ?? ?? Your fatigue and weakness continue or get worse.   ?? ?? You have any abnormal bleeding, such as:  ? Nosebleeds.  ? Vaginal bleeding that is different (heavier, more frequent, at a different time of the month) than what you are used to.  ? Bloody or black stools, or rectal bleeding.  ? Bloody or pink urine.   ??Watch closely for changes in your health, and be sure to contact your doctor if:  ?? ?? You do not get better as expected.   Where can you learn more?  Go to [email or web address].  Enter R301 in the search box to learn more about "Anemia: Care Instructions."  Current as of: September 22, 2017  Content Version: 12.2  ?? 2006-2019 [employer]. Care instructions adapted under license by [employer] (which disclaims liability or warranty for this information). If you have questions about a medical condition or this instruction, always ask your healthcare professional. [place] any warranty or liability for your use of this information.         Anemia: Care Instructions  Your Care Instructions    Anemia is a low level of red blood cells, which carry oxygen throughout your body. Many things can cause anemia. Lack of iron is one of the most common causes. Your body needs iron to make hemoglobin, a substance in red blood cells that carries oxygen from the lungs to your body's cells. Without enough iron, the body produces fewer and smaller red blood cells. As a result, your body's cells do not get enough oxygen, and you feel tired and weak. And you may have trouble concentrating.  Bleeding is the most common cause of a lack of iron. You may have heavy menstrual bleeding or bleeding caused by conditions such as ulcers, hemorrhoids, or cancer. Regular use of aspirin or other anti-inflammatory  medicines (such as ibuprofen) also can cause bleeding in some people. A lack of iron in your diet also can cause anemia, especially at times when the body needs more iron, such as during pregnancy, infancy, and the teen years.  Your doctor may have prescribed iron pills. It may take several months of treatment for your iron levels to return to normal. Your doctor also may suggest that you eat foods that are rich in iron, such as meat and beans.  There are many other causes of anemia. It is not always due to a lack of iron. Finding the specific cause of your anemia will help your doctor find the right treatment for  you.  Follow-up care is a key part of your treatment and safety. Be sure to make and go to all appointments, and call your doctor if you are having problems. It's also a good idea to know your test results and keep a list of the medicines you take.  How can you care for yourself at home?  ?? Take your medicines exactly as prescribed. Call your doctor if you think you are having a problem with your medicine.  ?? If your doctor recommends iron pills, take them as directed:  ? Try to take the pills on an empty stomach about 1 hour before or 2 hours after meals. But you may need to take iron with food to avoid an upset stomach.  ? Do not take antacids or drink milk or caffeine drinks (such as coffee, tea, or cola) at the same time or within 2 hours of the time that you take your iron. They can make it hard for your body to absorb the iron.  ? Vitamin C (from food or supplements) helps your body absorb iron. Try taking iron pills with a glass of orange juice or some other food that is high in vitamin C, such as citrus fruits.  ? Iron pills may cause stomach problems, such as heartburn, nausea, diarrhea, constipation, and cramps. Be sure to drink plenty of fluids, and include fruits, vegetables, and fiber in your diet each day. Iron pills often make your bowel movements dark or green.   ? If you forget to take an iron pill, do not take a double dose of iron the next time you take a pill.  ? Keep iron pills out of the reach of small children. An overdose of iron can be very dangerous.  ?? Follow your doctor's advice about eating iron-rich foods. These include red meat, shellfish, poultry, eggs, beans, raisins, whole-grain bread, and leafy green vegetables.  ?? Steam vegetables to help them keep their iron content.  When should you call for help?  Call 911 anytime you think you may need emergency care. For example, call if:  ?? ?? You have symptoms of a heart attack. These may include:  ? Chest pain or pressure, or a strange feeling in the chest.  ? Sweating.  ? Shortness of breath.  ? Nausea or vomiting.  ? Pain, pressure, or a strange feeling in the back, neck, jaw, or upper belly or in one or both shoulders or arms.  ? Lightheadedness or sudden weakness.  ? A fast or irregular heartbeat.  After you call 911, the operator may tell you to chew 1 adult-strength or 2 to 4 low-dose aspirin. Wait for an ambulance. Do not try to drive yourself.   ?? ?? You passed out (lost consciousness).   ??Call your doctor now or seek immediate medical care if:  ?? ?? You have new or increased shortness of breath.   ?? ?? You are dizzy or lightheaded, or you feel like you may faint.   ?? ?? Your fatigue and weakness continue or get worse.   ?? ?? You have any abnormal bleeding, such as:  ? Nosebleeds.  ? Vaginal bleeding that is different (heavier, more frequent, at a different time of the month) than what you are used to.  ? Bloody or black stools, or rectal bleeding.  ? Bloody or pink urine.   ??Watch closely for changes in your health, and be sure to contact your doctor if:  ?? ?? You do not get  better as expected.   Where can you learn more?  Go to [email or web address].  Enter R301 in the search box to learn more about "Anemia: Care Instructions."  Current as of: September 22, 2017  Content Version: 12.2   ?? 2006-2019 [employer]. Care instructions adapted under license by [employer] (which disclaims liability or warranty for this information). If you have questions about a medical condition or this instruction, always ask your healthcare professional. [place] any warranty or liability for your use of this information.

## 2018-06-15 ENCOUNTER — Inpatient Hospital Stay: Admit: 2018-06-15 | Payer: MEDICARE | Primary: Legal Medicine

## 2018-06-15 LAB — IRON PROFILE
Iron % saturation: 22 %
Iron: 95 ug/dL (ref 50–175)
TIBC: 436 ug/dL (ref 250–450)

## 2018-06-15 LAB — METABOLIC PANEL, COMPREHENSIVE
A-G Ratio: 1.3 (ref 0.8–1.7)
ALT (SGPT): 34 U/L (ref 16–61)
AST (SGOT): 34 U/L (ref 10–38)
Albumin: 4.5 g/dL (ref 3.4–5.0)
Alk. phosphatase: 257 U/L — ABNORMAL HIGH (ref 45–117)
Anion gap: 9 mmol/L (ref 3.0–18)
BUN/Creatinine ratio: 17 (ref 12–20)
BUN: 17 MG/DL (ref 7.0–18)
Bilirubin, total: 1 MG/DL (ref 0.2–1.0)
CO2: 22 mmol/L (ref 21–32)
Calcium: 9.9 MG/DL (ref 8.5–10.1)
Chloride: 108 mmol/L (ref 100–111)
Creatinine: 0.98 MG/DL (ref 0.6–1.3)
GFR est AA: >60 mL/min/{1.73_m2} (ref >60)
GFR est non-AA: >60 mL/min/{1.73_m2} (ref >60)
Globulin: 3.6 g/dL (ref 2.0–4.0)
Glucose: 129 mg/dL — ABNORMAL HIGH (ref 74–99)
Potassium: 4.4 mmol/L (ref 3.5–5.5)
Protein, total: 8.1 g/dL (ref 6.4–8.2)
Sodium: 139 mmol/L (ref 136–145)

## 2018-06-15 LAB — FERRITIN: Ferritin: 186 NG/ML (ref 8–388)

## 2018-06-18 ENCOUNTER — Encounter

## 2018-06-19 ENCOUNTER — Inpatient Hospital Stay: Payer: MEDICARE | Primary: Legal Medicine

## 2018-06-19 LAB — PROTEIN ELECTROPHORESIS
A/G ratio: 1.4 (ref 0.7–1.7)
ALPHA-2 GLOBULIN: 0.9 g/dL (ref 0.4–1.0)
Albumin: 4.4 g/dL (ref 2.9–4.4)
Alpha-1-globulin: 0.3 g/dL (ref 0.0–0.4)
Beta globulin: 1 g/dL (ref 0.7–1.3)
Gamma globulin: 0.9 g/dL (ref 0.4–1.8)
Globulin, total: 3.1 g/dL (ref 2.2–3.9)
Protein, total: 7.5 g/dL (ref 6.0–8.5)

## 2018-06-27 MED ORDER — TOPIRAMATE 100 MG TAB
100 mg | ORAL_TABLET | ORAL | 0 refills | Status: DC
Start: 2018-06-27 — End: 2018-09-25

## 2018-06-27 NOTE — Telephone Encounter (Signed)
The patient needs to make a follow-up appointment with PCP around 08/12/2018.  Lopid refill deferred to PCP due to possible serious drug interaction with Crestor.

## 2018-06-28 MED ORDER — SUCRALFATE 1 GRAM TAB
1 gram | ORAL_TABLET | ORAL | 0 refills | Status: DC
Start: 2018-06-28 — End: 2018-09-25

## 2018-06-29 ENCOUNTER — Encounter

## 2018-06-29 NOTE — Telephone Encounter (Signed)
Requested Prescriptions     Pending Prescriptions Disp Refills   ??? gemfibrozil (LOPID) 600 mg tablet [Pharmacy Med Name: GEMFIBROZIL 600MG  TABLETS] 180 Tab 0     Sig: TAKE 1 TABLET BY MOUTH TWICE DAILY

## 2018-06-30 MED ORDER — GEMFIBROZIL 600 MG TAB
600 mg | ORAL_TABLET | ORAL | 0 refills | Status: DC
Start: 2018-06-30 — End: 2018-09-25

## 2018-07-03 NOTE — Telephone Encounter (Signed)
Patient's wife advised of PCP recommendations. She states she is not bringing him to the ED or an urgent care. She wants her husband to see his own PCP. I advised wife that we are completely booked until after Jan. 20th and if he is having any trouble swallowing or breathing, he needs to go to the ED as soon as possible. Wife states he is not having trouble breathing or swallowing. She is asking for Dr. Phineas Douglas to please squeeze the patient in. Advised I can send a message to provider but could not guarantee anything.

## 2018-07-03 NOTE — Telephone Encounter (Signed)
Patient's wife called and stated that the patient has a growth on his tonsil. He has a sore throat and hurts to swallow. There are no available appointments soon. Please advise.

## 2018-07-03 NOTE — Telephone Encounter (Signed)
Availability tomorrow at 11:15. Verified with Dr. Phineas Douglas that this appt slot would be ok. Appt scheduled and wife notified. They will be here tomorrow at 11:15. Advised the wife that if he is having any trouble breathing or swallowing, the patient MUST go to ED and if presents to office with those symptoms, he will be sent straight to ED. Wife verbalized.

## 2018-07-03 NOTE — Telephone Encounter (Signed)
Please advise patient to go to ER or urgent care

## 2018-07-04 ENCOUNTER — Ambulatory Visit
Admit: 2018-07-04 | Discharge: 2018-07-04 | Payer: PRIVATE HEALTH INSURANCE | Attending: Legal Medicine | Primary: Legal Medicine

## 2018-07-04 DIAGNOSIS — J029 Acute pharyngitis, unspecified: Secondary | ICD-10-CM

## 2018-07-04 LAB — AMB POC RAPID STREP A: Group A Strep Ag: NEGATIVE

## 2018-07-04 MED ORDER — CLINDAMYCIN 300 MG CAP
300 mg | ORAL_CAPSULE | Freq: Three times a day (TID) | ORAL | 0 refills | Status: AC
Start: 2018-07-04 — End: 2018-07-11

## 2018-07-04 NOTE — Progress Notes (Signed)
William Blanchard Sr. is a 73 y.o. male (DOB: 04/26/1946) presenting to address:    Chief Complaint   Patient presents with   ??? Mass     patient stated something is growing on his tonsil       he noticed on Friday       Vitals:    07/04/18 1115   BP: 124/71   Pulse: 73   Resp: 20   Temp: 97.6 ??F (36.4 ??C)   TempSrc: Oral   SpO2: 100%   Weight: 134 lb 6.4 oz (61 kg)   Height: 5\' 9"  (1.753 m)   PainSc:   7   PainLoc: Back       Hearing/Vision:   No exam data present    Learning Assessment:     Learning Assessment 02/21/2018   PRIMARY LEARNER Patient   HIGHEST LEVEL OF EDUCATION - PRIMARY LEARNER  -   BARRIERS PRIMARY LEARNER -   CO-LEARNER CAREGIVER -   PRIMARY LANGUAGE ENGLISH   LEARNER PREFERENCE PRIMARY READING     LISTENING     VIDEOS   ANSWERED BY patient   RELATIONSHIP SELF     Depression Screening:     3 most recent PHQ Screens 03/22/2018   Little interest or pleasure in doing things Not at all   Feeling down, depressed, irritable, or hopeless Not at all   Total Score PHQ 2 0     Fall Risk Assessment:     Fall Risk Assessment, last 12 mths 07/04/2018   Able to walk? Yes   Fall in past 12 months? No   Fall with injury? -   Number of falls in past 12 months -   Fall Risk Score -     Abuse Screening:     Abuse Screening Questionnaire 03/22/2018   Do you ever feel afraid of your partner? N   Are you in a relationship with someone who physically or mentally threatens you? N   Is it safe for you to go home? Y     Coordination of Care Questionaire:   1. Have you been to the ER, urgent care clinic since your last visit?  Hospitalized since your last visit? NO    2. Have you seen or consulted any other health care providers outside of the [facility] since your last visit?  Include any pap smears or colon screening. NO    Advanced Directive:   1. Do you have an Advanced Directive? NO    2. Would you like information on Advanced Directives? NO

## 2018-07-04 NOTE — Progress Notes (Signed)
William Blalock Philipps Sr.     Chief Complaint   Patient presents with   ??? Mass     patient stated something is growing on his tonsil       he noticed on Friday     Vitals:    07/04/18 1115   BP: 124/71   Pulse: 73   Resp: 20   Temp: 97.6 ??F (36.4 ??C)   TempSrc: Oral   SpO2: 100%   Weight: 134 lb 6.4 oz (61 kg)   Height: '5\' 9"'$  (1.753 m)   PainSc:   7   PainLoc: Back         HPI:William Blanchard is here complaining about sore throat and painful swallowing, also his lymph nodes in his neck were painful, he had fever and he also noticed whitish coloration on his right tonsil, that started resolving today, he has nausea but no vomiting, he has some mild abdominal pain no diarrhea.    Past Medical History:   Diagnosis Date   ??? Anemia    ??? Anxiety    ??? Chronic pain    ??? Cirrhosis of liver ([place])    ??? Diabetes ([place])    ??? GERD (gastroesophageal reflux disease)    ??? Hyperlipemia    ??? Hypertension    ??? Hypotension    ??? Migraine    ??? Other cirrhosis of liver ([place])    ??? Stroke [facility])     had 3 strokes    ??? Thrombocytopenia ([place])      Past Surgical History:   Procedure Laterality Date   ??? HX CHOLECYSTECTOMY     ??? HX ORTHOPAEDIC      R hand sx   ??? HX OTHER SURGICAL      Hand surgery- Nail gun went through finger     Social History     Tobacco Use   ??? Smoking status: Former Smoker   ??? Smokeless tobacco: Never Used   ??? Tobacco comment: quit years ago   Substance Use Topics   ??? Alcohol use: No       Family History   Problem Relation Age of Onset   ??? Alzheimer Mother    ??? Diabetes Mother    ??? Cancer Mother         colon cancer    ??? Hypertension Father    ??? Heart Disease Father    ??? Cancer Father         prostate, lung cancer    ??? Diabetes Sister    ??? Heart Disease Sister    ??? Diabetes Brother        Review of Systems   Constitutional: Negative for chills, fever, malaise/fatigue and weight loss.   HENT: Positive for sore throat. Negative for congestion, ear discharge, ear pain, hearing loss and nosebleeds.     Eyes: Negative for blurred vision, double vision and discharge.   Respiratory: Negative for cough.    Cardiovascular: Negative for chest pain, palpitations, claudication and leg swelling.   Gastrointestinal: Positive for nausea. Negative for abdominal pain, constipation, diarrhea and vomiting.   Genitourinary: Negative for dysuria, frequency and urgency.   Musculoskeletal: Negative for myalgias.   Skin: Negative for itching and rash.   Neurological: Negative for dizziness, tingling, sensory change, speech change, focal weakness, weakness and headaches.   Psychiatric/Behavioral: Negative for depression and suicidal ideas.       Physical Exam  Vitals signs and nursing note reviewed. Exam conducted with a chaperone present (he is  with his wife today ).   Constitutional:       General: He is not in acute distress.     Appearance: He is well-developed. He is not diaphoretic.   HENT:      Head: Normocephalic and atraumatic.      Mouth/Throat:      Pharynx: Oropharyngeal exudate and posterior oropharyngeal erythema present.   Eyes:      General: No scleral icterus.        Right eye: No discharge.         Left eye: No discharge.      Conjunctiva/sclera: Conjunctivae normal.      Pupils: Pupils are equal, round, and reactive to light.   Neck:      Musculoskeletal: Normal range of motion and neck supple.      Thyroid: No thyromegaly.   Cardiovascular:      Rate and Rhythm: Normal rate and regular rhythm.      Heart sounds: Normal heart sounds.   Pulmonary:      Effort: Pulmonary effort is normal. No respiratory distress.      Breath sounds: Normal breath sounds. No rales.   Abdominal:      General: Bowel sounds are normal. There is no distension.      Palpations: Abdomen is soft. There is no mass.      Tenderness: There is no tenderness. There is no rebound.   Musculoskeletal: Normal range of motion.         General: No tenderness or deformity.   Lymphadenopathy:      Cervical: No cervical adenopathy.   Skin:      General: Skin is warm and dry.      Findings: No erythema or rash.   Neurological:      Mental Status: He is alert and oriented to person, place, and time.      Cranial Nerves: No cranial nerve deficit.      Coordination: Coordination normal.   Psychiatric:         Thought Content: Thought content normal.         Judgment: Judgment normal.          Assessment and plan     Plan of care has been discussed with the patient, he agrees to the plan and verbalized understanding.  All his questions were answered  More than 50% of the time spent in this visit was counseling the patient about  illness and treatment options         1. Sore throat  Strep a rapid antigen is negative  - AMB POC RAPID STREP A  - clindamycin (CLEOCIN) 300 mg capsule; Take 1 Cap by mouth three (3) times daily for 7 days.  Dispense: 21 Cap; Refill: 0    2. Acute tonsillitis, unspecified etiology  Patient will be treated with antibiotic due to age and comorbid conditions    As well as left positive lymph node and exudate    Advised to gargle with salt and water    Follow-up in the office if there is no improvement in few days  - clindamycin (CLEOCIN) 300 mg capsule; Take 1 Cap by mouth three (3) times daily for 7 days.  Dispense: 21 Cap; Refill: 0    Current Outpatient Medications   Medication Sig Dispense Refill   ??? venlafaxine (EFFEXOR) 37.5 mg tablet Take 37.5 mg by mouth three (3) times daily.     ??? venlafaxine (EFFEXOR) 75 mg tablet Take 25 mg  by mouth three (3) times daily.     ??? esomeprazole (NEXIUM) 20 mg capsule Take  by mouth daily.     ??? metoclopramide HCl (REGLAN) 10 mg tablet Take 10 mg by mouth Before breakfast, lunch, dinner and at bedtime.     ??? clindamycin (CLEOCIN) 300 mg capsule Take 1 Cap by mouth three (3) times daily for 7 days. 21 Cap 0   ??? gemfibrozil (LOPID) 600 mg tablet TAKE 1 TABLET BY MOUTH TWICE DAILY 180 Tab 0   ??? sucralfate (CARAFATE) 1 gram tablet TAKE 1 TABLET BY MOUTH FOUR TIMES  DAILY WITH MEALS AND AT BEDTIME 360 Tab 0   ??? topiramate (TOPAMAX) 100 mg tablet TAKE 1 TABLET BY MOUTH DAILY 90 Tab 0   ??? LORazepam (ATIVAN) 1 mg tablet Take 1 Tab by mouth every eight (8) hours as needed for Anxiety for up to 90 days. Max Daily Amount: 3 mg. 90 Tab 0   ??? rosuvastatin (CRESTOR) 10 mg tablet TAKE 1 TABLET BY MOUTH NIGHTLY 90 Tab 0   ??? citalopram (CELEXA) 20 mg tablet TAKE 1 TABLET BY MOUTH DAILY FOR 2 WEEKS THEN TAKE 1 TABLET BY MOUTH EVERY OTHER DAY 58 Tab 0   ??? propranolol (INDERAL) 20 mg tablet TAKE 1 TABLET BY MOUTH THREE TIMES DAILY (Patient taking differently: Take 20 mg by mouth two (2) times a day.) 270 Tab 0   ??? prochlorperazine (COMPAZINE) 10 mg tablet TAKE 1/2 TABLET BY MOUTH EVERY 6 HOURS AS NEEDED 30 Tab 0   ??? finasteride (PROSCAR) 5 mg tablet TAKE 1 TABLET BY MOUTH DAILY 90 Tab 0   ??? metFORMIN (GLUCOPHAGE) 500 mg tablet TAKE 2 TABLETS BY MOUTH TWICE DAILY WITH MEALS(GENERIC FOR GLUCOPHAGE) (Patient taking differently: Take 500 mg by mouth two (2) times daily (with meals).) 360 Tab 0   ??? pramipexole (MIRAPEX) 0.125 mg tablet TAKE 1 TABLET BY MOUTH NIGHTLY 30 Tab 0   ??? lancets (ONETOUCH ULTRASOFT LANCETS) misc Use to test blood sugars four time daily. 1 Each 11   ??? glucose blood VI test strips (ASCENSIA AUTODISC VI, ONE TOUCH ULTRA TEST VI) strip Use to test blood sugars four times daily with Onetouch ultra 2. 400 Strip 3   ??? Insulin Needles, Disposable, (BD ULTRA-FINE SHORT PEN NEEDLE) 31 gauge x 5/16" ndle Use 4 times daily to inject insulin. 1 Package 11   ??? oxyCODONE IR (ROXICODONE) 10 mg tab immediate release tablet Take 10 mg by mouth every six (6) hours as needed for Pain.     ??? HUMULIN R REGULAR U-100 INSULN 100 unit/mL injection As needed per sliding scale 1 Vial 6   ??? Insulin Syringe-Needle U-100 1 mL 29 gauge x 1/2" syrg Three times per day and as needed as per sliding scale 100 Syringe 6   ??? naloxone (NARCAN) 4 mg/actuation nasal spray Use 1 spray intranasally  into 1 nostril. Use a new Narcan nasal spray for subsequent doses and administer into alternating nostrils. May repeat every 2 to 3 minutes as needed for opioid overdose symptoms. 1 Each 0   ??? cholecalciferol, vitamin D3, (VITAMIN D3) 2,000 unit tab Take  by mouth.     ??? B.infantis-B.ani-B.long-B.bifi (PROBIOTIC 4X) 10-15 mg TbEC Take  by mouth.     ??? cyclobenzaprine (FLEXERIL) 10 mg tablet Take  by mouth three (3) times daily as needed for Muscle Spasm(s).     ??? GENERLAC 10 gram/15 mL solution TAKE 30ML BY MOUTH TWICE DAILY 5676 mL  0   ??? aspirin 81 mg chewable tablet Take 81 mg by mouth daily.         Patient Active Problem List    Diagnosis Date Noted   ??? Peripheral vascular disease ([place]) 02/21/2018   ??? Restless leg syndrome 12/04/2017   ??? Migraine without aura and without status migrainosus, not intractable 04/15/2017   ??? 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 Dermatitis 01/05/2017   ??? Benign prostatic hyperplasia (BPH) with straining on urination 12/31/2016   ??? 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 Myelodysplastic syndrome ([place]) 12/22/2016   ??? 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 Iron deficiency anemia secondary to inadequate dietary iron intake 09/23/2016   ??? Chronic leukopenia 09/23/2016   ??? Thrombocytopenia ([place]) 09/23/2016   ??? 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 Type II diabetes mellitus ([place]) 08/03/2016   ??? HTN (hypertension) 08/03/2016   ??? 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 Stroke ([place]) 08/03/2016   ??? Compressed vertebrae 08/03/2016   ??? Chronic back pain greater than 3 months duration 08/03/2016   ??? Hyperlipemia 08/03/2016   ??? Anxiety 08/03/2016   ??? Depression 06/30/2016   ??? Iron deficiency anemia 04/22/2016   ??? NAFLD (nonalcoholic fatty liver disease) 04/22/2016   ??? Other cirrhosis of liver ([place]) 04/22/2016   ??? Chronic anemia 03/18/2016   ??? Controlled type 2 diabetes mellitus without complication, without  long-term current use of insulin ([place]) 03/18/2016   ??? Essential hypertension 03/18/2016   ??? Hyperlipidemia 03/18/2016   ??? History of stroke 03/18/2016   ??? Anxiety 03/18/2016   ??? Chronic pain 03/18/2016     Results for orders placed or performed in visit on 07/04/18   AMB POC RAPID STREP A   Result Value Ref Range    VALID INTERNAL CONTROL POC Yes     Group A Strep Ag Negative Negative     Office Visit on 07/04/2018   Component Date Value Ref Range Status   ??? VALID INTERNAL CONTROL POC 07/04/2018 Yes   Final   ??? Group A Strep Ag 07/04/2018 Negative  Negative Final   Hospital Outpatient Visit on 06/14/2018   Component Date Value Ref Range Status   ??? Iron 06/14/2018 95  50 - 175 ug/dL Final    Patients receiving metal-binding drugs (e.g. deferoxamine) may show spuriously depressed iron values, as chelated iron may not properly react in the iron assay.   ??? TIBC 06/14/2018 436  250 - 450 ug/dL Final   ??? Iron % saturation 06/14/2018 22  % Final   ??? Ferritin 06/14/2018 186  8 - 388 NG/ML Final   ??? Sodium 06/14/2018 139  136 - 145 mmol/L Final   ??? Potassium 06/14/2018 4.4  3.5 - 5.5 mmol/L Final   ??? Chloride 06/14/2018 108  100 - 111 mmol/L Final   ??? CO2 06/14/2018 22  21 - 32 mmol/L Final   ??? Anion gap 06/14/2018 9  3.0 - 18 mmol/L Final   ??? Glucose 06/14/2018 129* 74 - 99 mg/dL Final   ??? BUN 06/14/2018 17  7.0 - 18 MG/DL Final   ??? Creatinine 06/14/2018 0.98  0.6 - 1.3 MG/DL Final   ??? BUN/Creatinine ratio 06/14/2018 17  12 - 20   Final   ??? GFR est AA 06/14/2018 >60  >60 ml/min/1.72m Final   ??? GFR est non-AA 06/14/2018 >60  >60 ml/min/1.783mFinal    Comment: (NOTE)  Estimated GFR is  calculated using the Modification of Diet in Renal   Disease (MDRD) Study equation, reported for both African Americans   (GFRAA) and non-African Americans (GFRNA), and normalized to 1.3m   body surface area. The physician must decide which value applies to   the patient. The MDRD study equation should only be used in    individuals age 6314or older. It has not been validated for the   following: pregnant women, patients with serious comorbid conditions,   or on certain medications, or persons with extremes of body size,   muscle mass, or nutritional status.     ??? Calcium 06/14/2018 9.9  8.5 - 10.1 MG/DL Final   ??? Bilirubin, total 06/14/2018 1.0  0.2 - 1.0 MG/DL Final   ??? ALT (SGPT) 06/14/2018 34  16 - 61 U/L Final   ??? AST (SGOT) 06/14/2018 34  10 - 38 U/L Final   ??? Alk. phosphatase 06/14/2018 257* 45 - 117 U/L Final   ??? Protein, total 06/14/2018 8.1  6.4 - 8.2 g/dL Final   ??? Albumin 06/14/2018 4.5  3.4 - 5.0 g/dL Final   ??? Globulin 06/14/2018 3.6  2.0 - 4.0 g/dL Final   ??? A-G Ratio 06/14/2018 1.3  0.8 - 1.7   Final   ??? Protein, total 06/14/2018 7.5  6.0 - 8.5 g/dL Final   ??? Albumin 06/14/2018 4.4  2.9 - 4.4 g/dL Final   ??? Alpha-1-globulin 06/14/2018 0.3  0.0 - 0.4 g/dL Final   ??? ALPHA-2 GLOBULIN 06/14/2018 0.9  0.4 - 1.0 g/dL Final   ??? Beta globulin 06/14/2018 1.0  0.7 - 1.3 g/dL Final   ??? Gamma globulin 06/14/2018 0.9  0.4 - 1.8 g/dL Final   ??? M-Spike 06/14/2018 Not Observed  Not Observed g/dL Final   ??? Globulin, total 06/14/2018 3.1  2.2 - 3.9 g/dL Final   ??? A/G ratio 06/14/2018 1.4  0.7 - 1.7   Final   Hospital Outpatient Visit on 06/14/2018   Component Date Value Ref Range Status   ??? WBC 06/14/2018 3.9* 4.5 - 13.0 K/uL Final   ??? RBC 06/14/2018 4.01* 4.10 - 5.10 M/uL Final   ??? HGB 06/14/2018 13.0  12.0 - 16.0 g/dL Final   ??? HCT 06/14/2018 38.6  36 - 48 % Final   ??? MCV 06/14/2018 96.3  78 - 102 FL Final   ??? MCH 06/14/2018 32.4  25.0 - 35.0 PG Final   ??? MCHC 06/14/2018 33.7  31 - 37 g/dL Final   ??? RDW 06/14/2018 12.9  11.5 - 14.5 % Final   ??? PLATELET 06/14/2018 79* 140 - 440 K/uL Final   ??? NEUTROPHILS 06/14/2018 77* 40 - 70 % Final   ??? MIXED CELLS 06/14/2018 10  0.1 - 17 % Final   ??? LYMPHOCYTES 06/14/2018 13* 14 - 44 % Final   ??? ABS. NEUTROPHILS 06/14/2018 3.0  1.8 - 9.5 K/UL Final    ??? ABS. MIXED CELLS 06/14/2018 0.4  0.0 - 2.3 K/uL Final   ??? ABS. LYMPHOCYTES 06/14/2018 0.5* 1.1 - 5.9 K/UL Final    [facility]. Reviewed by Medical Director.   ??? DF 06/14/2018 AUTOMATED    Final   Hospital Outpatient Visit on 05/22/2018   Component Date Value Ref Range Status   ??? WBC 05/22/2018 3.3* 4.5 - 13.0 K/uL Final   ??? RBC 05/22/2018 3.89* 4.10 - 5.10 M/uL Final   ??? HGB  05/22/2018 12.7  12.0 - 16.0 g/dL Final   ??? HCT 05/22/2018 37.2  36 - 48 % Final   ??? MCV 05/22/2018 95.6  78 - 102 FL Final   ??? [place] 05/22/2018 32.6  25.0 - 35.0 PG Final   ??? MCHC 05/22/2018 34.1  31 - 37 g/dL Final   ??? RDW 05/22/2018 13.3  11.5 - 14.5 % Final   ??? PLATELET 05/22/2018 78* 140 - 440 K/uL Final   ??? NEUTROPHILS 05/22/2018 73* 40 - 70 % Final   ??? MIXED CELLS 05/22/2018 12  0.1 - 17 % Final   ??? LYMPHOCYTES 05/22/2018 15  14 - 44 % Final   ??? ABS. NEUTROPHILS 05/22/2018 2.4  1.8 - 9.5 K/UL Final   ??? ABS. MIXED CELLS 05/22/2018 0.4  0.0 - 2.3 K/uL Final   ??? ABS. LYMPHOCYTES 05/22/2018 0.5* 1.1 - 5.9 K/UL Final    Test performed at [place] or [facility] Location. Reviewed by Medical Director.   ??? DF 05/22/2018 AUTOMATED    Final   Hospital Outpatient Visit on 04/24/2018   Component Date Value Ref Range Status   ??? WBC 04/24/2018 3.5* 4.5 - 13.0 K/uL Final   ??? RBC 04/24/2018 3.97* 4.10 - 5.10 M/uL Final   ??? HGB 04/24/2018 13.2  12.0 - 16.0 g/dL Final   ??? HCT 04/24/2018 38.4  36 - 48 % Final   ??? MCV 04/24/2018 96.7  78 - 102 FL Final   ??? [place] 04/24/2018 33.2  25.0 - 35.0 PG Final   ??? MCHC 04/24/2018 34.4  31 - 37 g/dL Final   ??? RDW 04/24/2018 13.7  11.5 - 14.5 % Final   ??? PLATELET 04/24/2018 61* 140 - 440 K/uL Final   ??? NEUTROPHILS 04/24/2018 76* 40 - 70 % Final   ??? MIXED CELLS 04/24/2018 10  0.1 - 17 % Final   ??? LYMPHOCYTES 04/24/2018 14  14 - 44 % Final    ??? ABS. NEUTROPHILS 04/24/2018 2.6  1.8 - 9.5 K/UL Final   ??? ABS. MIXED CELLS 04/24/2018 0.4  0.0 - 2.3 K/uL Final   ??? ABS. LYMPHOCYTES 04/24/2018 0.5* 1.1 - 5.9 K/UL Final    Test performed at [place] or [facility] Location. Reviewed by Medical Director.   ??? DF 04/24/2018 AUTOMATED    Final          Follow-up and Dispositions    ?? Return in about 3 months (around 10/03/2018), or if symptoms worsen or fail to improve.

## 2018-07-04 NOTE — Telephone Encounter (Signed)
Seen today

## 2018-07-17 ENCOUNTER — Institutional Professional Consult (permissible substitution): Admit: 2018-07-17 | Discharge: 2018-07-18 | Payer: PRIVATE HEALTH INSURANCE | Primary: Legal Medicine

## 2018-07-17 ENCOUNTER — Inpatient Hospital Stay: Admit: 2018-07-17 | Primary: Legal Medicine

## 2018-07-17 ENCOUNTER — Inpatient Hospital Stay: Admit: 2018-07-17 | Payer: MEDICARE | Primary: Legal Medicine

## 2018-07-17 DIAGNOSIS — D508 Other iron deficiency anemias: Secondary | ICD-10-CM

## 2018-07-17 LAB — CBC WITH 3 PART DIFF
ABS. LYMPHOCYTES: 0.5 10*3/uL — ABNORMAL LOW (ref 1.1–5.9)
ABS. MIXED CELLS: 0.3 10*3/uL (ref 0.0–2.3)
ABS. NEUTROPHILS: 2.6 10*3/uL (ref 1.8–9.5)
HCT: 38.8 % (ref 36–48)
HGB: 12.7 g/dL (ref 12.0–16.0)
LYMPHOCYTES: 15 % (ref 14–44)
MCH: 31.6 PG (ref 25.0–35.0)
MCHC: 32.7 g/dL (ref 31–37)
MCV: 96.5 FL (ref 78–102)
Mixed cells: 9 % (ref 0.1–17)
NEUTROPHILS: 76 % — ABNORMAL HIGH (ref 40–70)
PLATELET: 87 10*3/uL — ABNORMAL LOW (ref 140–440)
RBC: 4.02 M/uL — ABNORMAL LOW (ref 4.10–5.10)
RDW: 13.5 % (ref 11.5–14.5)
WBC: 3.4 10*3/uL — ABNORMAL LOW (ref 4.5–13.0)

## 2018-07-17 NOTE — Progress Notes (Signed)
[facility] Progress Note    Date: July 17, 2018    Name: Riyansh Gerstner [facility]    MRN: [id number]         DOB: 12-18-1945      Mr. Alpern was assessed and education was provided.     Mr. Stapel vitals were reviewed and patient was observed for 5 minutes prior to treatment.   Visit Vitals  BP 116/65 (BP 1 Location: Left arm, BP Patient Position: At rest;Sitting)   Pulse 60   Temp 97.1 ??F (36.2 ??C)   Resp 16   SpO2 100%       Lab results were obtained and reviewed.  Recent Results (from the past 12 hour(s))   CBC WITH 3 PART DIFF    Collection Time: 07/17/18  3:18 PM   Result Value Ref Range    WBC 3.4 (L) 4.5 - 13.0 K/uL    RBC 4.02 (L) 4.10 - 5.10 M/uL    HGB 12.7 12.0 - 16.0 g/dL    HCT 38.8 36 - 48 %    MCV 96.5 78 - 102 FL    MCH 31.6 25.0 - 35.0 PG    MCHC 32.7 31 - 37 g/dL    RDW 13.5 11.5 - 14.5 %    PLATELET 87 (L) 140 - 440 K/uL    NEUTROPHILS 76 (H) 40 - 70 %    MIXED CELLS 9 0.1 - 17 %    LYMPHOCYTES 15 14 - 44 %    ABS. NEUTROPHILS 2.6 1.8 - 9.5 K/UL    ABS. MIXED CELLS 0.3 0.0 - 2.3 K/uL    ABS. LYMPHOCYTES 0.5 (L) 1.1 - 5.9 K/UL    DF AUTOMATED         Retacrit not given for H&H 12.7/38.8.     Patient armband removed and shredded.    Mr. Sida was discharged from [place] in stable condition at 1530. He is to return on 08/14/18 at 1450 for his next appointment for CBC/Retacrit Q 4 wks.      Lynnda Shields, RN  July 17, 2018

## 2018-08-03 MED ORDER — ROSUVASTATIN 10 MG TAB
10 mg | ORAL_TABLET | ORAL | 0 refills | Status: DC
Start: 2018-08-03 — End: 2018-11-10

## 2018-08-11 NOTE — Telephone Encounter (Signed)
Pt's wife called in regards to the pt, she is wanting to know if Dr Leron Croak was ever able to get in contact with Dr Phineas Douglas about recent xrays the pt did with Dr Leron Croak that showed he has hardening of the arteries.     She just wants to know if Dr Phineas Douglas needs to see the pt at all or if there is any instructions from Dr Phineas Douglas that they need to be following. Please advise

## 2018-08-11 NOTE — Telephone Encounter (Signed)
Findings from Dr. Leron Croak is in green folder review.

## 2018-08-14 ENCOUNTER — Inpatient Hospital Stay: Payer: MEDICARE | Primary: Legal Medicine

## 2018-08-14 ENCOUNTER — Encounter: Primary: Legal Medicine

## 2018-08-14 ENCOUNTER — Encounter

## 2018-08-14 NOTE — Telephone Encounter (Signed)
Last HB Aic is 5.6 reduce dose to once daily

## 2018-08-15 MED ORDER — METFORMIN 500 MG TAB
500 mg | ORAL_TABLET | Freq: Every day | ORAL | 0 refills | Status: DC
Start: 2018-08-15 — End: 2018-11-16

## 2018-08-15 NOTE — Telephone Encounter (Signed)
Patient notified to decrease Metformin to once daily.

## 2018-08-16 ENCOUNTER — Inpatient Hospital Stay: Payer: MEDICARE | Primary: Legal Medicine

## 2018-08-16 ENCOUNTER — Encounter: Primary: Legal Medicine

## 2018-08-18 ENCOUNTER — Encounter

## 2018-08-18 NOTE — Telephone Encounter (Signed)
Last visit 07/04/18

## 2018-08-18 NOTE — Telephone Encounter (Signed)
Requested Prescriptions     Pending Prescriptions Disp Refills   ??? LORazepam (ATIVAN) 1 mg tablet 90 Tab 0     Sig: Take 1 Tab by mouth every eight (8) hours as needed for Anxiety for up to 90 days. Max Daily Amount: 3 mg.     Future Appointments   Date Time Provider [place]   08/30/2018  1:30 PM [facility] [address] [place]   09/04/2018 11:15 AM [name], MD [facility] SCHED   09/13/2018  1:45 PM Suzy Bouchard, MD [place]   09/27/2018  1:30 PM [facility] 1 [place]   10/25/2018  1:30 PM [facility] 1 [place]   11/22/2018  1:30 PM [facility] [place]

## 2018-08-20 NOTE — Telephone Encounter (Signed)
Need office visit   Please see if there is same day to book them

## 2018-08-21 NOTE — Telephone Encounter (Signed)
Will be discussed in the follow up appointment

## 2018-08-21 NOTE — Telephone Encounter (Signed)
Spoke with patient, appt rescheduled for 08/22/18 at 1:00

## 2018-08-22 ENCOUNTER — Ambulatory Visit
Admit: 2018-08-22 | Discharge: 2018-08-22 | Payer: PRIVATE HEALTH INSURANCE | Attending: Legal Medicine | Primary: Legal Medicine

## 2018-08-22 ENCOUNTER — Encounter: Admit: 2018-08-22 | Discharge: 2018-08-22 | Payer: PRIVATE HEALTH INSURANCE | Primary: Legal Medicine

## 2018-08-22 DIAGNOSIS — I700 Atherosclerosis of aorta: Secondary | ICD-10-CM

## 2018-08-22 MED ORDER — INSULIN NEEDLES (DISPOSABLE) 31 X 5/16"
31 gauge x 5/16" | PACK | 11 refills | Status: AC
Start: 2018-08-22 — End: ?

## 2018-08-22 MED ORDER — LORAZEPAM 1 MG TAB
1 mg | ORAL_TABLET | Freq: Every day | ORAL | 0 refills | Status: DC
Start: 2018-08-22 — End: 2018-12-07

## 2018-08-22 MED ORDER — BLOOD-GLUCOSE METER
0 refills | Status: AC
Start: 2018-08-22 — End: ?

## 2018-08-22 NOTE — Progress Notes (Signed)
Verl Blalock Moncada Sr. presents today for   Chief Complaint   Patient presents with   ??? Diabetes   ??? Anxiety       Is someone accompanying this pt? yes    Is the patient using any DME equipment during OV? yes    Depression Screening:  3 most recent PHQ Screens 03/22/2018   Little interest or pleasure in doing things Not at all   Feeling down, depressed, irritable, or hopeless Not at all   Total Score PHQ 2 0       Learning Assessment:  Learning Assessment 02/21/2018   PRIMARY LEARNER Patient   HIGHEST LEVEL OF EDUCATION - PRIMARY LEARNER  -   BARRIERS PRIMARY LEARNER -   CO-LEARNER CAREGIVER -   PRIMARY LANGUAGE ENGLISH   LEARNER PREFERENCE PRIMARY READING     LISTENING     VIDEOS   ANSWERED BY patient   RELATIONSHIP SELF       Abuse Screening:  Abuse Screening Questionnaire 03/22/2018   Do you ever feel afraid of your partner? N   Are you in a relationship with someone who physically or mentally threatens you? N   Is it safe for you to go home? Y       Fall Risk  Fall Risk Assessment, last 12 mths 07/04/2018   Able to walk? Yes   Fall in past 12 months? No   Fall with injury? -   Number of falls in past 12 months -   Fall Risk Score -       Health Maintenance reviewed and discussed and ordered per Provider.    Health Maintenance Due   Topic Date Due   ??? Foot Exam Q1  03/19/1956   .      Pt currently taking Antiplatelet therapy? yes    Coordination of Care:  1. Have you been to the ER, urgent care clinic since your last visit? Hospitalized since your last visit? no    2. Have you seen or consulted any other health care providers outside of the [facility] since your last visit? Include any pap smears or colon screening. yes

## 2018-08-22 NOTE — Progress Notes (Signed)
William Blalock Pilkenton Sr.     Chief Complaint   Patient presents with   ??? Diabetes   ??? Anxiety     Vitals:    08/22/18 1303   BP: 111/65   Pulse: 70   Resp: 16   Temp: 97.8 ??F (36.6 ??C)   TempSrc: Oral   SpO2: 98%   Weight: 135 lb (61.2 kg)   Height: '5\' 9"'$  (1.753 m)   PainSc:   7         [id number], William Blanchard   Is here here today accompanied by his wife as usual for follow up and need medication refill for ativan it was refilled about 3 months ago when he still have 1 week supply, I still discouraged the patient from using Ativan and he should start having half a tablet instead of 1 tablet to taper himself off.      She was seen and Dr. Leron Croak pain management he had x-ray of the lumbar spine which showed aortoiliac atherosclerosis, patient need to be evaluated by vascular surgery.    She still having migraine headache it has improved on Effexor, but he still struggles with episodic migraine.              Past Medical History:   Diagnosis Date   ??? Anemia    ??? Anxiety    ??? Chronic pain    ??? Cirrhosis of liver ([place])    ??? Diabetes ([place])    ??? GERD (gastroesophageal reflux disease)    ??? Hyperlipemia    ??? Hypertension    ??? Hypotension    ??? Migraine    ??? Other cirrhosis of liver ([place])    ??? Stroke [facility])     had 3 strokes    ??? Thrombocytopenia ([place])      Past Surgical History:   Procedure Laterality Date   ??? HX CHOLECYSTECTOMY     ??? HX ORTHOPAEDIC      R hand sx   ??? HX OTHER SURGICAL      Hand surgery- Nail gun went through finger     Social History     Tobacco Use   ??? Smoking status: Former Smoker   ??? Smokeless tobacco: Never Used   ??? Tobacco comment: quit years ago   Substance Use Topics   ??? Alcohol use: No       Family History   Problem Relation Age of Onset   ??? Alzheimer Mother    ??? Diabetes Mother    ??? Cancer Mother         colon cancer    ??? Hypertension Father    ??? Heart Disease Father    ??? Cancer Father         prostate, lung cancer    ??? Diabetes Sister    ??? Heart Disease Sister    ??? Diabetes Brother         Review of Systems   Constitutional: Negative for chills, fever, malaise/fatigue and weight loss.   HENT: Positive for hearing loss. Negative for congestion, ear discharge, ear pain, nosebleeds, sinus pain and sore throat.    Eyes: Negative for blurred vision, double vision and discharge.   Respiratory: Negative for cough, hemoptysis, sputum production and shortness of breath.    Cardiovascular: Negative for chest pain, palpitations, claudication and leg swelling.   Gastrointestinal: Positive for abdominal pain. Negative for constipation, diarrhea, nausea and vomiting.   Genitourinary: Negative for dysuria, frequency and urgency.   Musculoskeletal:  Positive for back pain. Negative for joint pain and myalgias.   Skin: Negative for itching and rash.   Neurological: Positive for tingling and sensory change. Negative for dizziness, speech change, focal weakness, weakness and headaches.   Psychiatric/Behavioral: Negative for depression, hallucinations, substance abuse and suicidal ideas. The patient is not nervous/anxious.        Physical Exam  Vitals signs and nursing note reviewed.   Constitutional:       General: He is not in acute distress.     Appearance: He is well-developed. He is not diaphoretic.   HENT:      Head: Normocephalic and atraumatic.      Mouth/Throat:      Pharynx: No oropharyngeal exudate.   Eyes:      General: No scleral icterus.        Right eye: No discharge.         Left eye: No discharge.      Conjunctiva/sclera: Conjunctivae normal.      Pupils: Pupils are equal, round, and reactive to light.   Neck:      Musculoskeletal: Normal range of motion and neck supple.      Thyroid: No thyromegaly.   Cardiovascular:      Rate and Rhythm: Normal rate and regular rhythm.      Heart sounds: Normal heart sounds.   Pulmonary:      Effort: Pulmonary effort is normal. No respiratory distress.      Breath sounds: Normal breath sounds. No rales.   Abdominal:       General: Bowel sounds are normal. There is no distension.      Palpations: Abdomen is soft. There is no mass.      Tenderness: There is no abdominal tenderness.   Musculoskeletal: Normal range of motion.         General: No tenderness or deformity.   Lymphadenopathy:      Cervical: No cervical adenopathy.   Skin:     General: Skin is warm and dry.      Capillary Refill: Capillary refill takes 2 to 3 seconds.      Findings: Erythema present. No rash.      Comments: Feet look erythematous ,not warm to touch  There are spider veins but no varicose veins     Neurological:      Mental Status: He is alert and oriented to person, place, and time.      Cranial Nerves: No cranial nerve deficit.   Psychiatric:         Thought Content: Thought content normal.         Judgment: Judgment normal.          Assessment and plan     Plan of care has been discussed with the patient, he agrees to the plan and verbalized understanding.  All his questions were answered  More than 50% of the time spent in this visit was counseling the patient about  illness and treatment options         1. Anxiety    Stable patient had a recommended to taper off Ativan, slowly by taking half a tablet daily and then half a tablet every other day  - LORazepam (ATIVAN) 1 mg tablet; Take 1 Tab by mouth daily for 90 days. Max Daily Amount: 1 mg.  Dispense: 90 Tab; Refill: 0    2. Controlled type 2 diabetes mellitus without complication, without long-term current use of insulin ([place])  Well-controlled now he is  taking metformin 500 mg once daily last hemoglobin A1c was 5.6  - Insulin Needles, Disposable, (BD ULTRA-FINE SHORT PEN NEEDLE) 31 gauge x 5/16" ndle; Use 4 times daily to inject insulin.  Dispense: 1 Package; Refill: 11  - Blood-Glucose Meter (ONETOUCH ULTRA2 METER) misc; Use to check blood sugar BID  Dispense: 1 Each; Refill: 0    3. Atherosclerosis of aorta (H[facility])    - REFERRAL TO VASCULAR SURGERY    4. Restless leg syndrome   Well-controlled on current medications    5. Migraine without aura and without status migrainosus, not intractable  Fairly controlled    6. Myelodysplastic syndrome ([place])  Following up with oncology    Current Outpatient Medications   Medication Sig Dispense Refill   ??? LORazepam (ATIVAN) 1 mg tablet Take 1 Tab by mouth daily for 90 days. Max Daily Amount: 1 mg. 90 Tab 0   ??? Insulin Needles, Disposable, (BD ULTRA-FINE SHORT PEN NEEDLE) 31 gauge x 5/16" ndle Use 4 times daily to inject insulin. 1 Package 11   ??? Blood-Glucose Meter (ONETOUCH ULTRA2 METER) misc Use to check blood sugar BID 1 Each 0   ??? metFORMIN (GLUCOPHAGE) 500 mg tablet Take 1 Tab by mouth daily (with lunch) for 180 days. 180 Tab 0   ??? rosuvastatin (CRESTOR) 10 mg tablet TAKE 1 TABLET BY MOUTH NIGHTLY 90 Tab 0   ??? venlafaxine (EFFEXOR) 37.5 mg tablet Take 37.5 mg by mouth three (3) times daily.     ??? venlafaxine (EFFEXOR) 75 mg tablet Take 25 mg by mouth three (3) times daily.     ??? esomeprazole (NEXIUM) 20 mg capsule Take  by mouth daily.     ??? metoclopramide HCl (REGLAN) 10 mg tablet Take 10 mg by mouth Before breakfast, lunch, dinner and at bedtime.     ??? gemfibrozil (LOPID) 600 mg tablet TAKE 1 TABLET BY MOUTH TWICE DAILY 180 Tab 0   ??? sucralfate (CARAFATE) 1 gram tablet TAKE 1 TABLET BY MOUTH FOUR TIMES DAILY WITH MEALS AND AT BEDTIME 360 Tab 0   ??? topiramate (TOPAMAX) 100 mg tablet TAKE 1 TABLET BY MOUTH DAILY 90 Tab 0   ??? GENERLAC 10 gram/15 mL solution TAKE 30ML BY MOUTH TWICE DAILY 5676 mL 0   ??? propranolol (INDERAL) 20 mg tablet TAKE 1 TABLET BY MOUTH THREE TIMES DAILY (Patient taking differently: Take 20 mg by mouth two (2) times a day.) 270 Tab 0   ??? finasteride (PROSCAR) 5 mg tablet TAKE 1 TABLET BY MOUTH DAILY 90 Tab 0   ??? pramipexole (MIRAPEX) 0.125 mg tablet TAKE 1 TABLET BY MOUTH NIGHTLY 30 Tab 0   ??? lancets (ONETOUCH ULTRASOFT LANCETS) misc Use to test blood sugars four time daily. 1 Each 11    ??? glucose blood VI test strips (ASCENSIA AUTODISC VI, ONE TOUCH ULTRA TEST VI) strip Use to test blood sugars four times daily with Onetouch ultra 2. 400 Strip 3   ??? oxyCODONE IR (ROXICODONE) 10 mg tab immediate release tablet Take 10 mg by mouth every six (6) hours as needed for Pain.     ??? HUMULIN R REGULAR U-100 INSULN 100 unit/mL injection As needed per sliding scale 1 Vial 6   ??? Insulin Syringe-Needle U-100 1 mL 29 gauge x 1/2" syrg Three times per day and as needed as per sliding scale 100 Syringe 6   ??? aspirin 81 mg chewable tablet Take 81 mg by mouth daily.     ??? cholecalciferol, vitamin  D3, (VITAMIN D3) 2,000 unit tab Take  by mouth.     ??? B.infantis-B.ani-B.long-B.bifi (PROBIOTIC 4X) 10-15 mg TbEC Take  by mouth.     ??? cyclobenzaprine (FLEXERIL) 10 mg tablet Take  by mouth three (3) times daily as needed for Muscle Spasm(s).     ??? citalopram (CELEXA) 20 mg tablet TAKE 1 TABLET BY MOUTH DAILY FOR 2 WEEKS THEN TAKE 1 TABLET BY MOUTH EVERY OTHER DAY 58 Tab 0   ??? prochlorperazine (COMPAZINE) 10 mg tablet TAKE 1/2 TABLET BY MOUTH EVERY 6 HOURS AS NEEDED 30 Tab 0   ??? naloxone (NARCAN) 4 mg/actuation nasal spray Use 1 spray intranasally into 1 nostril. Use a new Narcan nasal spray for subsequent doses and administer into alternating nostrils. May repeat every 2 to 3 minutes as needed for opioid overdose symptoms. 1 Each 0       Patient Active Problem List    Diagnosis Date Noted   ??? Peripheral vascular disease ([place]) 02/21/2018   ??? Restless leg syndrome 12/04/2017   ??? Migraine without aura and without status migrainosus, not intractable 04/15/2017   ??? 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 Dermatitis 01/05/2017   ??? Benign prostatic hyperplasia (BPH) with straining on urination 12/31/2016   ??? 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 Myelodysplastic syndrome ([place]) 12/22/2016   ??? Type 2 diabetes with nephropathy ([place]) 10/18/2016    ??? Iron deficiency anemia secondary to inadequate dietary iron intake 09/23/2016   ??? Chronic leukopenia 09/23/2016   ??? Thrombocytopenia ([place]) 09/23/2016   ??? 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 Type II diabetes mellitus ([place]) 08/03/2016   ??? HTN (hypertension) 08/03/2016   ??? 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 Stroke ([place]) 08/03/2016   ??? Compressed vertebrae 08/03/2016   ??? Chronic back pain greater than 3 months duration 08/03/2016   ??? Hyperlipemia 08/03/2016   ??? Anxiety 08/03/2016   ??? Depression 06/30/2016   ??? Iron deficiency anemia 04/22/2016   ??? NAFLD (nonalcoholic fatty liver disease) 04/22/2016   ??? Other cirrhosis of liver ([place]) 04/22/2016   ??? Chronic anemia 03/18/2016   ??? Controlled type 2 diabetes mellitus without complication, without long-term current use of insulin ([place]) 03/18/2016   ??? Essential hypertension 03/18/2016   ??? Hyperlipidemia 03/18/2016   ??? History of stroke 03/18/2016   ??? Anxiety 03/18/2016   ??? Chronic pain 03/18/2016     Results for orders placed or performed during the hospital encounter of 07/17/18   CBC WITH 3 PART DIFF   Result Value Ref Range    WBC 3.4 (L) 4.5 - 13.0 K/uL    RBC 4.02 (L) 4.10 - 5.10 M/uL    HGB 12.7 12.0 - 16.0 g/dL    HCT 38.8 36 - 48 %    MCV 96.5 78 - 102 FL    MCH 31.6 25.0 - 35.0 PG    MCHC 32.7 31 - 37 g/dL    RDW 13.5 11.5 - 14.5 %    PLATELET 87 (L) 140 - 440 K/uL    NEUTROPHILS 76 (H) 40 - 70 %    MIXED CELLS 9 0.1 - 17 %    LYMPHOCYTES 15 14 - 44 %    ABS. NEUTROPHILS 2.6 1.8 - 9.5 K/UL    ABS. MIXED CELLS 0.3 0.0 - 2.3 K/uL    ABS. LYMPHOCYTES 0.5 (L) 1.1 - 5.9 K/UL    [facility] AUTOMATED       Hospital Outpatient  Visit on 07/17/2018   Component Date Value Ref Range Status   ??? WBC 07/17/2018 3.4* 4.5 - 13.0 K/uL Final   ??? RBC 07/17/2018 4.02* 4.10 - 5.10 M/uL Final   ??? HGB 07/17/2018 12.7  12.0 - 16.0 g/dL Final   ??? HCT 07/17/2018 38.8  36 - 48 % Final   ??? MCV 07/17/2018 96.5  78 - 102 FL Final   ??? MCH 07/17/2018 31.6  25.0 - 35.0 PG Final    ??? MCHC 07/17/2018 32.7  31 - 37 g/dL Final   ??? RDW 07/17/2018 13.5  11.5 - 14.5 % Final   ??? PLATELET 07/17/2018 87* 140 - 440 K/uL Final   ??? NEUTROPHILS 07/17/2018 76* 40 - 70 % Final   ??? MIXED CELLS 07/17/2018 9  0.1 - 17 % Final   ??? LYMPHOCYTES 07/17/2018 15  14 - 44 % Final   ??? ABS. NEUTROPHILS 07/17/2018 2.6  1.8 - 9.5 K/UL Final   ??? ABS. MIXED CELLS 07/17/2018 0.3  0.0 - 2.3 K/uL Final   ??? ABS. LYMPHOCYTES 07/17/2018 0.5* 1.1 - 5.9 K/UL Final    Test performed at [place] or Outpatient Infusion Center Location. Reviewed by Medical Director.   ??? DF 07/17/2018 AUTOMATED    Final   Office Visit on 07/04/2018   Component Date Value Ref Range Status   ??? VALID INTERNAL CONTROL POC 07/04/2018 Yes   Final   ??? Group A Strep Ag 07/04/2018 Negative  Negative Final   Hospital Outpatient Visit on 06/14/2018   Component Date Value Ref Range Status   ??? Iron 06/14/2018 95  50 - 175 ug/dL Final    Patients receiving metal-binding drugs (e.g. deferoxamine) may show spuriously depressed iron values, as chelated iron may not properly react in the iron assay.   ??? TIBC 06/14/2018 436  250 - 450 ug/dL Final   ??? Iron % saturation 06/14/2018 22  % Final   ??? Ferritin 06/14/2018 186  8 - 388 NG/ML Final   ??? Sodium 06/14/2018 139  136 - 145 mmol/L Final   ??? Potassium 06/14/2018 4.4  3.5 - 5.5 mmol/L Final   ??? Chloride 06/14/2018 108  100 - 111 mmol/L Final   ??? CO2 06/14/2018 22  21 - 32 mmol/L Final   ??? Anion gap 06/14/2018 9  3.0 - 18 mmol/L Final   ??? Glucose 06/14/2018 129* 74 - 99 mg/dL Final   ??? BUN 06/14/2018 17  7.0 - 18 MG/DL Final   ??? Creatinine 06/14/2018 0.98  0.6 - 1.3 MG/DL Final   ??? BUN/Creatinine ratio 06/14/2018 17  12 - 20   Final   ??? GFR est AA 06/14/2018 >60  >60 ml/min/1.74m Final   ??? GFR est non-AA 06/14/2018 >60  >60 ml/min/1.773mFinal    Comment: (NOTE)  Estimated GFR is calculated using the Modification of Diet in Renal    Disease (MDRD) Study equation, reported for both African Americans   (GFRAA) and non-African Americans (GFRNA), and normalized to 1.7388m body surface area. The physician must decide which value applies to   the patient. The MDRD study equation should only be used in   individuals age 63 33 older. It has not been validated for the   following: pregnant women, patients with serious comorbid conditions,   or on certain medications, or persons with extremes of body size,   muscle mass, or nutritional status.     ??? Calcium  06/14/2018 9.9  8.5 - 10.1 MG/DL Final   ??? Bilirubin, total 06/14/2018 1.0  0.2 - 1.0 MG/DL Final   ??? ALT (SGPT) 06/14/2018 34  16 - 61 U/L Final   ??? AST (SGOT) 06/14/2018 34  10 - 38 U/L Final   ??? Alk. phosphatase 06/14/2018 257* 45 - 117 U/L Final   ??? Protein, total 06/14/2018 8.1  6.4 - 8.2 g/dL Final   ??? Albumin 06/14/2018 4.5  3.4 - 5.0 g/dL Final   ??? Globulin 06/14/2018 3.6  2.0 - 4.0 g/dL Final   ??? A-G Ratio 06/14/2018 1.3  0.8 - 1.7   Final   ??? Protein, total 06/14/2018 7.5  6.0 - 8.5 g/dL Final   ??? Albumin 06/14/2018 4.4  2.9 - 4.4 g/dL Final   ??? Alpha-1-globulin 06/14/2018 0.3  0.0 - 0.4 g/dL Final   ??? ALPHA-2 GLOBULIN 06/14/2018 0.9  0.4 - 1.0 g/dL Final   ??? Beta globulin 06/14/2018 1.0  0.7 - 1.3 g/dL Final   ??? Gamma globulin 06/14/2018 0.9  0.4 - 1.8 g/dL Final   ??? M-Spike 06/14/2018 Not Observed  Not Observed g/dL Final   ??? Globulin, total 06/14/2018 3.1  2.2 - 3.9 g/dL Final   ??? A/G ratio 06/14/2018 1.4  0.7 - 1.7   Final   Hospital Outpatient Visit on 06/14/2018   Component Date Value Ref Range Status   ??? WBC 06/14/2018 3.9* 4.5 - 13.0 K/uL Final   ??? RBC 06/14/2018 4.01* 4.10 - 5.10 M/uL Final   ??? HGB 06/14/2018 13.0  12.0 - 16.0 g/dL Final   ??? HCT 06/14/2018 38.6  36 - 48 % Final   ??? MCV 06/14/2018 96.3  78 - 102 FL Final   ??? MCH 06/14/2018 32.4  25.0 - 35.0 PG Final   ??? MCHC 06/14/2018 33.7  31 - 37 g/dL Final   ??? RDW 06/14/2018 12.9  11.5 - 14.5 % Final    ??? PLATELET 06/14/2018 79* 140 - 440 K/uL Final   ??? NEUTROPHILS 06/14/2018 77* 40 - 70 % Final   ??? MIXED CELLS 06/14/2018 10  0.1 - 17 % Final   ??? LYMPHOCYTES 06/14/2018 13* 14 - 44 % Final   ??? ABS. NEUTROPHILS 06/14/2018 3.0  1.8 - 9.5 K/UL Final   ??? ABS. MIXED CELLS 06/14/2018 0.4  0.0 - 2.3 K/uL Final   ??? ABS. LYMPHOCYTES 06/14/2018 0.5* 1.1 - 5.9 K/UL Final    Test performed at [place] or Outpatient Infusion Center Location. Reviewed by Medical Director.   ??? DF 06/14/2018 AUTOMATED    Final          Follow-up and Dispositions    ?? Return in about 3 months (around 11/20/2018).

## 2018-08-23 ENCOUNTER — Telehealth

## 2018-08-23 NOTE — Telephone Encounter (Signed)
Pt wife called the referral for vascular surgery at [facility] and [facility] does not take the pt insurance. Pt wife would like to see if we can find the husband one in network that takes his [employer]

## 2018-08-23 NOTE — Telephone Encounter (Signed)
[facility] called and states that the patient needs syringe size 5/16 instead of pen needles that were sent in. Please advise

## 2018-08-23 NOTE — Telephone Encounter (Signed)
Done

## 2018-08-23 NOTE — Telephone Encounter (Signed)
Correct rx pended

## 2018-08-24 MED ORDER — INSULIN SYRINGE U-100 WITH NEEDLE 0.3 ML 31 GAUGE X 5/16"
0.3 mL 31 gauge x 5/16" | INJECTION | 1 refills | Status: DC
Start: 2018-08-24 — End: 2019-10-07

## 2018-08-25 NOTE — Telephone Encounter (Signed)
Pt's wife called back and stated that they told her that they accept regular united [employer] but not the [employer] medicare replacement plan. She is asking him to be referred to a differnet place that does accept his ins. Please advise

## 2018-08-25 NOTE — Telephone Encounter (Signed)
Advised patient's wife for her to call insurance to find out which clinic will take his insurance and to let us know to send referral.

## 2018-08-25 NOTE — Telephone Encounter (Signed)
[facility]   Phone: [phone]   Fax: [phone]     Office opens at 8:30, will call back to verify if insurance is accepted.

## 2018-08-25 NOTE — Telephone Encounter (Signed)
Number given to patient's wife to call to schedule appt

## 2018-08-25 NOTE — Telephone Encounter (Signed)
Spoke with [facility] and verified they accept insurance. Will send referral.

## 2018-08-28 NOTE — Telephone Encounter (Signed)
p'ts wife called stating that she found a facility that takes the pt's insurance.    Dr. Dierdre Forth  [address] [place]  [phone] fax  [phone] fax    Referral, office notes, and insurance.    Wife states she can best be reached at [phone]

## 2018-08-28 NOTE — Telephone Encounter (Signed)
Faxed referral and OV notes to requested doctor per patient's wife. Wife notified everything was sent.

## 2018-08-29 ENCOUNTER — Encounter

## 2018-08-29 MED ORDER — PROPRANOLOL 20 MG TAB
20 mg | ORAL_TABLET | Freq: Two times a day (BID) | ORAL | 0 refills | Status: DC
Start: 2018-08-29 — End: 2019-01-10

## 2018-08-30 ENCOUNTER — Inpatient Hospital Stay: Payer: MEDICARE | Primary: Legal Medicine

## 2018-08-30 NOTE — Progress Notes (Signed)
William Blanchard called to reschedule 08/30/18 appt to 09/13/18 to be on the same day as office visit.

## 2018-09-04 ENCOUNTER — Encounter: Attending: Legal Medicine | Primary: Legal Medicine

## 2018-09-08 ENCOUNTER — Encounter: Attending: Vascular Surgery | Primary: Legal Medicine

## 2018-09-11 ENCOUNTER — Encounter: Payer: MEDICARE | Primary: Legal Medicine

## 2018-09-13 ENCOUNTER — Inpatient Hospital Stay: Admit: 2018-09-13 | Payer: MEDICARE | Primary: Legal Medicine

## 2018-09-13 ENCOUNTER — Institutional Professional Consult (permissible substitution): Admit: 2018-09-13 | Discharge: 2018-09-13 | Payer: PRIVATE HEALTH INSURANCE | Primary: Legal Medicine

## 2018-09-13 ENCOUNTER — Inpatient Hospital Stay: Admit: 2018-09-13 | Primary: Legal Medicine

## 2018-09-13 ENCOUNTER — Encounter: Payer: MEDICARE | Primary: Legal Medicine

## 2018-09-13 ENCOUNTER — Ambulatory Visit
Admit: 2018-09-13 | Discharge: 2018-09-13 | Payer: PRIVATE HEALTH INSURANCE | Attending: Hematology & Oncology | Primary: Legal Medicine

## 2018-09-13 ENCOUNTER — Encounter: Primary: Legal Medicine

## 2018-09-13 DIAGNOSIS — D508 Other iron deficiency anemias: Secondary | ICD-10-CM

## 2018-09-13 DIAGNOSIS — D464 Refractory anemia, unspecified: Secondary | ICD-10-CM

## 2018-09-13 DIAGNOSIS — D649 Anemia, unspecified: Secondary | ICD-10-CM

## 2018-09-13 LAB — CBC WITH 3 PART DIFF
ABS. LYMPHOCYTES: 0.4 10*3/uL — ABNORMAL LOW (ref 1.1–5.9)
ABS. MIXED CELLS: 0.4 10*3/uL (ref 0.0–2.3)
ABS. NEUTROPHILS: 2.7 10*3/uL (ref 1.8–9.5)
HCT: 35.8 % — ABNORMAL LOW (ref 36–48)
HGB: 12.2 g/dL (ref 12.0–16.0)
LYMPHOCYTES: 12 % — ABNORMAL LOW (ref 14–44)
MCH: 31.2 PG (ref 25.0–35.0)
MCHC: 34.1 g/dL (ref 31–37)
MCV: 91.6 FL (ref 78–102)
Mixed cells: 12 % (ref 0.1–17)
NEUTROPHILS: 77 % — ABNORMAL HIGH (ref 40–70)
PLATELET: 106 10*3/uL — ABNORMAL LOW (ref 140–440)
RBC: 3.91 M/uL — ABNORMAL LOW (ref 4.10–5.10)
RDW: 13 % (ref 11.5–14.5)
WBC: 3.5 10*3/uL — ABNORMAL LOW (ref 4.5–13.0)

## 2018-09-13 NOTE — Progress Notes (Signed)
Result reviewed and noted. Will continue to monitor

## 2018-09-13 NOTE — Progress Notes (Signed)
Hematology/Oncology  Progress Note    Name: William Meaders Sr.  Date: 09/13/2018  DOB: October 05, 1945    PCP: [name], MD     [name] is a 73 y.o.-year-old man who has myelodysplastic syndrome, chronic leukopenia, and severe anemia.  Current therapy: Procrit 60,000 units every 2 weeks when the hemoglobin is below 10 g/dL.    Subjective:     [name] is a 73 year old man who has myelodysplastic syndrome with severe anemia.  He also has chronic leukopenia and thrombocytopenia.  He is currently receiving Procrit 60,000 units every 2 weeks as needed for hemoglobin < 10 g/dL and hematocrit of < 30 %. He is here today for follow up. He said he is feeling well. He denies having fever, chills, dizziness, rashes, lymphadenopathy. He has no weakness and fatigue. He said he is now  sleeping well at night since he bought a new mattress. He has no complaints or concerns at this time.    Past medical history, family history, and social history: these were reviewed and remains unchanged.    Past Medical History:   Diagnosis Date   ??? Anemia    ??? Anxiety    ??? Chronic pain    ??? Cirrhosis of liver ([place])    ??? Diabetes ([place])    ??? GERD (gastroesophageal reflux disease)    ??? Hyperlipemia    ??? Hypertension    ??? Hypotension    ??? Migraine    ??? Other cirrhosis of liver ([place])    ??? Stroke [facility])     had 3 strokes    ??? Thrombocytopenia ([place])      Past Surgical History:   Procedure Laterality Date   ??? HX CHOLECYSTECTOMY     ??? HX ORTHOPAEDIC      R hand sx   ??? HX OTHER SURGICAL      Hand surgery- Nail gun went through finger     Social History     Socioeconomic History   ??? Marital status: MARRIED     Spouse name: Not on file   ??? Number of children: Not on file   ??? Years of education: Not on file   ??? Highest education level: Not on file   Occupational History   ??? Not on file   Social Needs   ??? Financial resource strain: Not on file   ??? Food insecurity     Worry: Not on file     Inability: Not on file   ??? Transportation needs     Medical:  Not on file     Non-medical: Not on file   Tobacco Use   ??? Smoking status: Former Smoker   ??? Smokeless tobacco: Never Used   ??? Tobacco comment: quit years ago   Substance and Sexual Activity   ??? Alcohol use: No   ??? Drug use: No   ??? Sexual activity: Never     Partners: Male   Lifestyle   ??? Physical activity     Days per week: Not on file     Minutes per session: Not on file   ??? Stress: Not on file   Relationships   ??? Social Product manager on phone: Not on file     Gets together: Not on file     Attends religious service: Not on file     Active member of club or organization: Not on file     Attends meetings of clubs or  organizations: Not on file     Relationship status: Not on file   ??? Intimate partner violence     Fear of current or ex partner: Not on file     Emotionally abused: Not on file     Physically abused: Not on file     Forced sexual activity: Not on file   Other Topics Concern   ??? Not on file   Social History Narrative    ** Merged History Encounter **          Family History   Problem Relation Age of Onset   ??? Alzheimer Mother    ??? Diabetes Mother    ??? Cancer Mother         colon cancer    ??? Hypertension Father    ??? Heart Disease Father    ??? Cancer Father         prostate, lung cancer    ??? Diabetes Sister    ??? Heart Disease Sister    ??? Diabetes Brother      Current Outpatient Medications   Medication Sig Dispense Refill   ??? propranolol (INDERAL) 20 mg tablet Take 1 Tab by mouth two (2) times a day for 90 days. 180 Tab 0   ??? Insulin Syringe-Needle U-100 (BD INSULIN SYRINGE ULTRA-FINE) 0.3 mL 31 gauge x 5/16" syrg Use 4 times daily to inject insulin 400 Syringe 1   ??? LORazepam (ATIVAN) 1 mg tablet Take 1 Tab by mouth daily for 90 days. Max Daily Amount: 1 mg. 90 Tab 0   ??? Insulin Needles, Disposable, (BD ULTRA-FINE SHORT PEN NEEDLE) 31 gauge x 5/16" ndle Use 4 times daily to inject insulin. 1 Package 11   ??? Blood-Glucose Meter (ONETOUCH ULTRA2 METER) misc Use to check blood sugar BID 1 Each 0   ???  metFORMIN (GLUCOPHAGE) 500 mg tablet Take 1 Tab by mouth daily (with lunch) for 180 days. 180 Tab 0   ??? rosuvastatin (CRESTOR) 10 mg tablet TAKE 1 TABLET BY MOUTH NIGHTLY 90 Tab 0   ??? venlafaxine (EFFEXOR) 37.5 mg tablet Take 37.5 mg by mouth three (3) times daily.     ??? venlafaxine (EFFEXOR) 75 mg tablet Take 25 mg by mouth three (3) times daily.     ??? esomeprazole (NEXIUM) 20 mg capsule Take  by mouth daily.     ??? metoclopramide HCl (REGLAN) 10 mg tablet Take 10 mg by mouth Before breakfast, lunch, dinner and at bedtime.     ??? gemfibrozil (LOPID) 600 mg tablet TAKE 1 TABLET BY MOUTH TWICE DAILY 180 Tab 0   ??? sucralfate (CARAFATE) 1 gram tablet TAKE 1 TABLET BY MOUTH FOUR TIMES DAILY WITH MEALS AND AT BEDTIME 360 Tab 0   ??? topiramate (TOPAMAX) 100 mg tablet TAKE 1 TABLET BY MOUTH DAILY 90 Tab 0   ??? GENERLAC 10 gram/15 mL solution TAKE 30ML BY MOUTH TWICE DAILY 5676 mL 0   ??? citalopram (CELEXA) 20 mg tablet TAKE 1 TABLET BY MOUTH DAILY FOR 2 WEEKS THEN TAKE 1 TABLET BY MOUTH EVERY OTHER DAY 58 Tab 0   ??? prochlorperazine (COMPAZINE) 10 mg tablet TAKE 1/2 TABLET BY MOUTH EVERY 6 HOURS AS NEEDED 30 Tab 0   ??? finasteride (PROSCAR) 5 mg tablet TAKE 1 TABLET BY MOUTH DAILY 90 Tab 0   ??? pramipexole (MIRAPEX) 0.125 mg tablet TAKE 1 TABLET BY MOUTH NIGHTLY 30 Tab 0   ??? lancets (ONETOUCH ULTRASOFT LANCETS) misc Use to test blood  sugars four time daily. 1 Each 11   ??? glucose blood VI test strips (ASCENSIA AUTODISC VI, ONE TOUCH ULTRA TEST VI) strip Use to test blood sugars four times daily with Onetouch ultra 2. 400 Strip 3   ??? oxyCODONE IR (ROXICODONE) 10 mg tab immediate release tablet Take 10 mg by mouth every six (6) hours as needed for Pain.     ??? HUMULIN R REGULAR U-100 INSULN 100 unit/mL injection As needed per sliding scale 1 Vial 6   ??? Insulin Syringe-Needle U-100 1 mL 29 gauge x 1/2" syrg Three times per day and as needed as per sliding scale 100 Syringe 6   ??? naloxone (NARCAN) 4 mg/actuation nasal spray Use 1 spray  intranasally into 1 nostril. Use a new Narcan nasal spray for subsequent doses and administer into alternating nostrils. May repeat every 2 to 3 minutes as needed for opioid overdose symptoms. 1 Each 0   ??? aspirin 81 mg chewable tablet Take 81 mg by mouth daily.     ??? cholecalciferol, vitamin D3, (VITAMIN D3) 2,000 unit tab Take  by mouth.     ??? B.infantis-B.ani-B.long-B.bifi (PROBIOTIC 4X) 10-15 mg TbEC Take  by mouth.     ??? cyclobenzaprine (FLEXERIL) 10 mg tablet Take  by mouth three (3) times daily as needed for Muscle Spasm(s).         Review of Systems  Constitutional: The patient has no acute distress or discomfort.  HEENT: The patient denies recent head trauma, eye pain, blurred vision,  hearing deficit, oropharyngeal mucosal pain or lesions, and the patient denies throat pain or discomfort.  Lymphatics: The patient denies palpable peripheral lymphadenopathy.  Hematologic: The patient denies having bruising, bleeding, or progressive fatigue.  Respiratory: Patient denies having shortness of breath, cough, sputum production, fever, or dyspnea on exertion.  Cardiovascular: The patient denies having leg pain, leg swelling, heart palpitations, chest permit, chest pain, or lightheadedness.  The patient denies having dyspnea on exertion.  Gastrointestinal: The patient denies having nausea, emesis, or diarrhea. The patient denies having any hematemesis or blood in the stool.  Genitourinary: Patient denies having urinary urgency, frequency, or dysuria.  The patient denies having blood in the urine.  Psychological: The patient denies having symptoms of nervousness, anxiety, depression, or thoughts of harming self.  Skin: Patient denies having skin rashes, skin, ulcerations, or unexplained itching or pruritus.  Musculoskeletal: The patient denies having pain in the joints or bones.      Objective:     There were no vitals taken for this visit.  ECOG PS=0  Physical Exam:   Gen. Appearance: The patient is in no acute  distress.  Skin: There is no bruise or rash.  HEENT: The exam is unremarkable.  Neck: Supple without lymphadenopathy or thyromegaly.  Lungs: Clear to auscultation and percussion; there are no wheezes or rhonchi.  Heart: Regular rate and rhythm; there are no murmurs, gallops, or rubs.  Abdomen: Bowel sounds are present and normal.  There is no guarding, tenderness, or hepatosplenomegaly.  Extremities: There is no clubbing, cyanosis, or edema.  Neurologic: There are no focal neurologic deficits.  Lymphatics: There is no palpable peripheral lymphadenopathy. Musculoskeletal: The patient has full range of motion at all joints.  There is no evidence of joint deformity or effusions.  There is no focal joint tenderness.  Psychological/psychiatric: There is no clinical evidence of anxiety, depression, or melancholy.    Lab data:      Results for orders placed or performed  during the hospital encounter of 09/13/18   CBC WITH 3 PART DIFF     Status: Abnormal   Result Value Ref Range Status    WBC 3.5 (L) 4.5 - 13.0 K/uL Final    RBC 3.91 (L) 4.10 - 5.10 M/uL Final    HGB 12.2 12.0 - 16.0 g/dL Final    HCT 35.8 (L) 36 - 48 % Final    MCV 91.6 78 - 102 FL Final    MCH 31.2 25.0 - 35.0 PG Final    MCHC 34.1 31 - 37 g/dL Final    RDW 13.0 11.5 - 14.5 % Final    PLATELET 106 (L) 140 - 440 K/uL Final    NEUTROPHILS 77 (H) 40 - 70 % Final    MIXED CELLS 12 0.1 - 17 % Final    LYMPHOCYTES 12 (L) 14 - 44 % Final    ABS. NEUTROPHILS 2.7 1.8 - 9.5 K/UL Final    ABS. MIXED CELLS 0.4 0.0 - 2.3 K/uL Final    ABS. LYMPHOCYTES 0.4 (L) 1.1 - 5.9 K/UL Final     Comment: Test performed at [place] or [facility] Location. Reviewed by Medical Director.    DF AUTOMATED   Final           Assessment:     1. 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 Myelodysplastic syndrome ([place])    3. Chronic leukopenia      Plan:   Myelodysplastic syndrome, refractory anemia due to  myelodysplastic syndrome/iron deficiency anemia: Have explained to the patient that his hemoglobin today is 12.2 g/dL with hematocrit of 35.8 %.  I will check his iron profile and ferritin levels at this time.  If his ferritin level is below 100 we will give  him intravenous iron to replenish his ferritin level.    Chronic leukopenia: The CBC from today shows that his WBC count is 3.5 K/uL with an absolute neutrophil count which is normal at 2.7 K/UL.  However the absolute lymphocyte count is low at 0.4 K/UL.  These will continue to be monitored.  Therapeutic intervention is not warranted.    Thrombocytopenia: CBC today shows a platelet count of 106,000.  No therapeutic intervention is warranted.    Follow-up in 3 months or sooner if indicated.  Orders Placed This Encounter   ??? IRON PROFILE     Standing Status:   Future     Standing Expiration Date:   7/89/3810   ??? METABOLIC PANEL, COMPREHENSIVE     Standing Status:   Future     Standing Expiration Date:   09/14/2019   ??? FERRITIN     Standing Status:   Future     Standing Expiration Date:   09/14/2019   ??? SPEP     Standing Status:   Future     Standing Expiration Date:   09/14/2019       Nicki Guadalajara, NP  09/13/2018     I have assessed the patient independently and  agree with the full assessment as outlined.  Joycelyn Das, MD, FACP      Please note: This document has been produced using voice recognition software.  Unrecognized errors in transcription may be present.

## 2018-09-13 NOTE — Patient Instructions (Addendum)
Hematology/Oncology  Progress Note    Name: William Raulston Sr.  Date: 09/13/2018  DOB: [birth year]    PCP: [name], MD     [name] is a 73 y.o.-year-old man who has myelodysplastic syndrome, chronic leukopenia, and severe anemia.  Current therapy: Procrit 60,000 units every 2 weeks when the hemoglobin is below 10 g/dL.    Subjective:     [name] is a 73 year old man who has myelodysplastic syndrome with severe anemia.  He also has chronic leukopenia and thrombocytopenia.  He is currently receiving Procrit 60,000 units every 2 weeks. His wife reports he is eating well and sleeping better. He denies tiredness, weakness, and fatigue. He denies shortness of breath, chest pain, and dizziness. He does not have any concerns or complaints to report at this time.  Since his last clinic visit he received 4 doses of intravenous Venofer after he was found to have a ferritin level of 8 ng/mL.  The iron therapy was tolerated well. Patient is here for follow up. His wife is with him today. Wife said that patient is no longer sleepy. She said that they  were able to put up a christmas tree this year. She also said that he seldomly takes the anti-anxiety medication. He denies having fever, night sweat, skin rashes and recent infection.He has no other physical complaints to report at this time.      Past medical history, family history, and social history: these were reviewed and remains unchanged.    Past Medical History:   Diagnosis Date   ??? Anemia    ??? Anxiety    ??? Chronic pain    ??? Cirrhosis of liver ([place])    ??? Diabetes ([place])    ??? GERD (gastroesophageal reflux disease)    ??? Hyperlipemia    ??? Hypertension    ??? Hypotension    ??? Migraine    ??? Other cirrhosis of liver ([place])    ??? Stroke [facility])     had 3 strokes    ??? Thrombocytopenia ([place])      Past Surgical History:   Procedure Laterality Date   ??? HX CHOLECYSTECTOMY     ??? HX ORTHOPAEDIC      R hand sx   ??? HX OTHER SURGICAL       Hand surgery- Nail gun went through finger     Social History     Socioeconomic History   ??? Marital status: MARRIED     Spouse name: Not on file   ??? Number of children: Not on file   ??? Years of education: Not on file   ??? Highest education level: Not on file   Occupational History   ??? Not on file   Social Needs   ??? Financial resource strain: Not on file   ??? Food insecurity     Worry: Not on file     Inability: Not on file   ??? Transportation needs     Medical: Not on file     Non-medical: Not on file   Tobacco Use   ??? Smoking status: Former Smoker   ??? Smokeless tobacco: Never Used   ??? Tobacco comment: quit years ago   Substance and Sexual Activity   ??? Alcohol use: No   ??? Drug use: No   ??? Sexual activity: Never     Partners: Male   Lifestyle   ??? Physical activity     Days per week: Not on file  Minutes per session: Not on file   ??? Stress: Not on file   Relationships   ??? Social Product manager on phone: Not on file     Gets together: Not on file     Attends religious service: Not on file     Active member of club or organization: Not on file     Attends meetings of clubs or organizations: Not on file     Relationship status: Not on file   ??? Intimate partner violence     Fear of current or ex partner: Not on file     Emotionally abused: Not on file     Physically abused: Not on file     Forced sexual activity: Not on file   Other Topics Concern   ??? Not on file   Social History Narrative    ** Merged History Encounter **          Family History   Problem Relation Age of Onset   ??? Alzheimer Mother    ??? Diabetes Mother    ??? Cancer Mother         colon cancer    ??? Hypertension Father    ??? Heart Disease Father    ??? Cancer Father         prostate, lung cancer    ??? Diabetes Sister    ??? Heart Disease Sister    ??? Diabetes Brother      Current Outpatient Medications   Medication Sig Dispense Refill   ??? propranolol (INDERAL) 20 mg tablet Take 1 Tab by mouth two (2) times a day for 90 days. 180 Tab 0    ??? Insulin Syringe-Needle U-100 (BD INSULIN SYRINGE ULTRA-FINE) 0.3 mL 31 gauge x 5/16" syrg Use 4 times daily to inject insulin 400 Syringe 1   ??? LORazepam (ATIVAN) 1 mg tablet Take 1 Tab by mouth daily for 90 days. Max Daily Amount: 1 mg. 90 Tab 0   ??? Insulin Needles, Disposable, (BD ULTRA-FINE SHORT PEN NEEDLE) 31 gauge x 5/16" ndle Use 4 times daily to inject insulin. 1 Package 11   ??? Blood-Glucose Meter (ONETOUCH ULTRA2 METER) misc Use to check blood sugar BID 1 Each 0   ??? metFORMIN (GLUCOPHAGE) 500 mg tablet Take 1 Tab by mouth daily (with lunch) for 180 days. 180 Tab 0   ??? rosuvastatin (CRESTOR) 10 mg tablet TAKE 1 TABLET BY MOUTH NIGHTLY 90 Tab 0   ??? venlafaxine (EFFEXOR) 37.5 mg tablet Take 37.5 mg by mouth three (3) times daily.     ??? venlafaxine (EFFEXOR) 75 mg tablet Take 25 mg by mouth three (3) times daily.     ??? esomeprazole (NEXIUM) 20 mg capsule Take  by mouth daily.     ??? metoclopramide HCl (REGLAN) 10 mg tablet Take 10 mg by mouth Before breakfast, lunch, dinner and at bedtime.     ??? gemfibrozil (LOPID) 600 mg tablet TAKE 1 TABLET BY MOUTH TWICE DAILY 180 Tab 0   ??? sucralfate (CARAFATE) 1 gram tablet TAKE 1 TABLET BY MOUTH FOUR TIMES DAILY WITH MEALS AND AT BEDTIME 360 Tab 0   ??? topiramate (TOPAMAX) 100 mg tablet TAKE 1 TABLET BY MOUTH DAILY 90 Tab 0   ??? GENERLAC 10 gram/15 mL solution TAKE 30ML BY MOUTH TWICE DAILY 5676 mL 0   ??? citalopram (CELEXA) 20 mg tablet TAKE 1 TABLET BY MOUTH DAILY FOR 2 WEEKS THEN TAKE 1 TABLET BY MOUTH EVERY  OTHER DAY 58 Tab 0   ??? prochlorperazine (COMPAZINE) 10 mg tablet TAKE 1/2 TABLET BY MOUTH EVERY 6 HOURS AS NEEDED 30 Tab 0   ??? finasteride (PROSCAR) 5 mg tablet TAKE 1 TABLET BY MOUTH DAILY 90 Tab 0   ??? pramipexole (MIRAPEX) 0.125 mg tablet TAKE 1 TABLET BY MOUTH NIGHTLY 30 Tab 0   ??? lancets (ONETOUCH ULTRASOFT LANCETS) misc Use to test blood sugars four time daily. 1 Each 11   ??? glucose blood VI test strips (ASCENSIA AUTODISC VI, ONE TOUCH ULTRA TEST  VI) strip Use to test blood sugars four times daily with Onetouch ultra 2. 400 Strip 3   ??? oxyCODONE IR (ROXICODONE) 10 mg tab immediate release tablet Take 10 mg by mouth every six (6) hours as needed for Pain.     ??? HUMULIN R REGULAR U-100 INSULN 100 unit/mL injection As needed per sliding scale 1 Vial 6   ??? Insulin Syringe-Needle U-100 1 mL 29 gauge x 1/2" syrg Three times per day and as needed as per sliding scale 100 Syringe 6   ??? naloxone (NARCAN) 4 mg/actuation nasal spray Use 1 spray intranasally into 1 nostril. Use a new Narcan nasal spray for subsequent doses and administer into alternating nostrils. May repeat every 2 to 3 minutes as needed for opioid overdose symptoms. 1 Each 0   ??? aspirin 81 mg chewable tablet Take 81 mg by mouth daily.     ??? cholecalciferol, vitamin D3, (VITAMIN D3) 2,000 unit tab Take  by mouth.     ??? B.infantis-B.ani-B.long-B.bifi (PROBIOTIC 4X) 10-15 mg TbEC Take  by mouth.     ??? cyclobenzaprine (FLEXERIL) 10 mg tablet Take  by mouth three (3) times daily as needed for Muscle Spasm(s).         Review of Systems  Constitutional: The patient has no acute distress or discomfort.  HEENT: The patient denies recent head trauma, eye pain, blurred vision,  hearing deficit, oropharyngeal mucosal pain or lesions, and the patient denies throat pain or discomfort.  Lymphatics: The patient denies palpable peripheral lymphadenopathy.  Hematologic: The patient denies having bruising, bleeding, or progressive fatigue.  Respiratory: Patient denies having shortness of breath, cough, sputum production, fever, or dyspnea on exertion.  Cardiovascular: The patient denies having leg pain, leg swelling, heart palpitations, chest permit, chest pain, or lightheadedness.  The patient denies having dyspnea on exertion.  Gastrointestinal: The patient denies having nausea, emesis, or diarrhea. The patient denies having any hematemesis or blood in the stool.   Genitourinary: Patient denies having urinary urgency, frequency, or dysuria.  The patient denies having blood in the urine.  Psychological: The patient denies having symptoms of nervousness, anxiety, depression, or thoughts of harming self.  Skin: Patient denies having skin rashes, skin, ulcerations, or unexplained itching or pruritus.  Musculoskeletal: The patient denies having pain in the joints or bones.      Objective:     There were no vitals taken for this visit.  ECOG PS=0  Physical Exam:   Gen. Appearance: The patient is in no acute distress.  Skin: There is no bruise or rash.  HEENT: The exam is unremarkable.  Neck: Supple without lymphadenopathy or thyromegaly.  Lungs: Clear to auscultation and percussion; there are no wheezes or rhonchi.  Heart: Regular rate and rhythm; there are no murmurs, gallops, or rubs.  Abdomen: Bowel sounds are present and normal.  There is no guarding, tenderness, or hepatosplenomegaly.  Extremities: There is no clubbing, cyanosis, or edema.  Neurologic: There are no focal neurologic deficits.  Lymphatics: There is no palpable peripheral lymphadenopathy. Musculoskeletal: The patient has full range of motion at all joints.  There is no evidence of joint deformity or effusions.  There is no focal joint tenderness.  Psychological/psychiatric: There is no clinical evidence of anxiety, depression, or melancholy.    Lab data:      Results for orders placed or performed during the hospital encounter of 09/13/18   CBC WITH 3 PART DIFF     Status: Abnormal   Result Value Ref Range Status    WBC 3.5 (L) 4.5 - 13.0 K/uL Final    RBC 3.91 (L) 4.10 - 5.10 M/uL Final    HGB 12.2 12.0 - 16.0 g/dL Final    HCT 35.8 (L) 36 - 48 % Final    MCV 91.6 78 - 102 FL Final    MCH 31.2 25.0 - 35.0 PG Final    MCHC 34.1 31 - 37 g/dL Final    RDW 13.0 11.5 - 14.5 % Final    PLATELET 106 (L) 140 - 440 K/uL Final    NEUTROPHILS 77 (H) 40 - 70 % Final    MIXED CELLS 12 0.1 - 17 % Final     LYMPHOCYTES 12 (L) 14 - 44 % Final    ABS. NEUTROPHILS 2.7 1.8 - 9.5 K/UL Final    ABS. MIXED CELLS 0.4 0.0 - 2.3 K/uL Final    ABS. LYMPHOCYTES 0.4 (L) 1.1 - 5.9 K/UL Final     Comment: Test performed at [facility] or [facility] Location. Reviewed by Medical Director.    DF AUTOMATED   Final           Assessment:     1. 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 Myelodysplastic syndrome ([place])    3. Chronic leukopenia      Plan:     Thrombocytopenia: The patient has a stable platelet count of 79,000 K/uL. No therapeutic intervention is warranted.    Chronic leukopenia: The CBC from today shows that his WBC count is 3.9 K/uL with an absolute neutrophil count which is normal at 3.0 K/UL. However the absolute lymphocyte count is low at 0.5 K/UL. We will continue to monitor every 2 months. Therapeutic intervention is not warranted. Infection prevention was discussed with patient and his wife.      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  This is a significant improvement after receiving intravenous iron for 4 doses in the form of Venofer.  I will check his iron profile and ferritin levels at this time along with comprehensive metabolic panel. If his ferritin level is below 25 we will give him intravenous iron to replenish his ferritin level. Patient is currently being monitored for Procrit injection every 2 weeks. Patient said that he has not had Procrit injection since February, 2019, therefore, assessment for Procrit injection was changed from every 2 weeks to every 4 weeks. Patient is aware that if his hemoglobin and hematocrit go below 10 g/dL and 30 % respectively, he will be given Procrit 60,000 units.      Follow-up in 3 months or sooner if indicated.     Orders Placed This Encounter   ??? IRON PROFILE     Standing Status:   Future  Standing Expiration Date:   4/31/5400   ??? METABOLIC PANEL, COMPREHENSIVE     Standing Status:   Future     Standing Expiration Date:   09/14/2019   ??? FERRITIN     Standing Status:   Future     Standing Expiration Date:   09/14/2019   ??? SPEP     Standing Status:   Future     Standing Expiration Date:   09/14/2019         Nicki Guadalajara, NP  09/13/2018       I have assessed the patient independently and  agree with the full assessment as outlined.  Joycelyn Das, MD, FACP                                                                                        Hematology/Oncology  Progress Note    Name: William Barajas Sr.  Date: 09/13/2018  DOB: 1945-08-08    PCP: [name], MD     Mr. Matherne is a 73 y.o.-year-old man who has myelodysplastic syndrome, chronic leukopenia, and severe anemia.  Current therapy: Procrit 60,000 units every 2 weeks when the hemoglobin is below 10 g/dL.    Subjective:     Mr. Bojarski is a 73 year old man who has myelodysplastic syndrome with severe anemia.  He also has chronic leukopenia and thrombocytopenia.  He is currently receiving Procrit 60,000 units every 2 weeks.  Today he is complaining of some fatigue and weakness.  His wife reports that although he is eating well he is continuing to lose weight and he is hypersomnolent, sleeping all of the time.    Past medical history, family history, and social history: these were reviewed and remains unchanged.    Past Medical History:   Diagnosis Date   ??? Anemia    ??? Anxiety    ??? Chronic pain    ??? Cirrhosis of liver ([place])    ??? Diabetes ([place])    ??? GERD (gastroesophageal reflux disease)    ??? Hyperlipemia    ??? Hypertension    ??? Hypotension    ??? Migraine    ??? Other cirrhosis of liver ([place])    ??? Stroke [facility])     had 3 strokes    ??? Thrombocytopenia ([place])      Past Surgical History:   Procedure Laterality Date   ??? HX CHOLECYSTECTOMY     ??? HX ORTHOPAEDIC       R hand sx   ??? HX OTHER SURGICAL      Hand surgery- Nail gun went through finger     Social History     Socioeconomic History   ??? Marital status: MARRIED     Spouse name: Not on file   ??? Number of children: Not on file   ??? Years of education: Not on file   ??? Highest education level: Not on file   Occupational History   ??? Not on file   Social Needs   ??? Financial resource strain: Not on file   ??? Food insecurity     Worry: Not on file  Inability: Not on file   ??? Transportation needs     Medical: Not on file     Non-medical: Not on file   Tobacco Use   ??? Smoking status: Former Smoker   ??? Smokeless tobacco: Never Used   ??? Tobacco comment: quit years ago   Substance and Sexual Activity   ??? Alcohol use: No   ??? Drug use: No   ??? Sexual activity: Never     Partners: Male   Lifestyle   ??? Physical activity     Days per week: Not on file     Minutes per session: Not on file   ??? Stress: Not on file   Relationships   ??? Social Product manager on phone: Not on file     Gets together: Not on file     Attends religious service: Not on file     Active member of club or organization: Not on file     Attends meetings of clubs or organizations: Not on file     Relationship status: Not on file   ??? Intimate partner violence     Fear of current or ex partner: Not on file     Emotionally abused: Not on file     Physically abused: Not on file     Forced sexual activity: Not on file   Other Topics Concern   ??? Not on file   Social History Narrative    ** Merged History Encounter **          Family History   Problem Relation Age of Onset   ??? Alzheimer Mother    ??? Diabetes Mother    ??? Cancer Mother         colon cancer    ??? Hypertension Father    ??? Heart Disease Father    ??? Cancer Father         prostate, lung cancer    ??? Diabetes Sister    ??? Heart Disease Sister    ??? Diabetes Brother      Current Outpatient Medications   Medication Sig Dispense Refill   ??? propranolol (INDERAL) 20 mg tablet Take 1 Tab by mouth two (2) times a  day for 90 days. 180 Tab 0   ??? Insulin Syringe-Needle U-100 (BD INSULIN SYRINGE ULTRA-FINE) 0.3 mL 31 gauge x 5/16" syrg Use 4 times daily to inject insulin 400 Syringe 1   ??? LORazepam (ATIVAN) 1 mg tablet Take 1 Tab by mouth daily for 90 days. Max Daily Amount: 1 mg. 90 Tab 0   ??? Insulin Needles, Disposable, (BD ULTRA-FINE SHORT PEN NEEDLE) 31 gauge x 5/16" ndle Use 4 times daily to inject insulin. 1 Package 11   ??? Blood-Glucose Meter (ONETOUCH ULTRA2 METER) misc Use to check blood sugar BID 1 Each 0   ??? metFORMIN (GLUCOPHAGE) 500 mg tablet Take 1 Tab by mouth daily (with lunch) for 180 days. 180 Tab 0   ??? rosuvastatin (CRESTOR) 10 mg tablet TAKE 1 TABLET BY MOUTH NIGHTLY 90 Tab 0   ??? venlafaxine (EFFEXOR) 37.5 mg tablet Take 37.5 mg by mouth three (3) times daily.     ??? venlafaxine (EFFEXOR) 75 mg tablet Take 25 mg by mouth three (3) times daily.     ??? esomeprazole (NEXIUM) 20 mg capsule Take  by mouth daily.     ??? metoclopramide HCl (REGLAN) 10 mg tablet Take 10 mg by mouth Before breakfast, lunch, dinner and at  bedtime.     ??? gemfibrozil (LOPID) 600 mg tablet TAKE 1 TABLET BY MOUTH TWICE DAILY 180 Tab 0   ??? sucralfate (CARAFATE) 1 gram tablet TAKE 1 TABLET BY MOUTH FOUR TIMES DAILY WITH MEALS AND AT BEDTIME 360 Tab 0   ??? topiramate (TOPAMAX) 100 mg tablet TAKE 1 TABLET BY MOUTH DAILY 90 Tab 0   ??? GENERLAC 10 gram/15 mL solution TAKE 30ML BY MOUTH TWICE DAILY 5676 mL 0   ??? citalopram (CELEXA) 20 mg tablet TAKE 1 TABLET BY MOUTH DAILY FOR 2 WEEKS THEN TAKE 1 TABLET BY MOUTH EVERY OTHER DAY 58 Tab 0   ??? prochlorperazine (COMPAZINE) 10 mg tablet TAKE 1/2 TABLET BY MOUTH EVERY 6 HOURS AS NEEDED 30 Tab 0   ??? finasteride (PROSCAR) 5 mg tablet TAKE 1 TABLET BY MOUTH DAILY 90 Tab 0   ??? pramipexole (MIRAPEX) 0.125 mg tablet TAKE 1 TABLET BY MOUTH NIGHTLY 30 Tab 0   ??? lancets (ONETOUCH ULTRASOFT LANCETS) misc Use to test blood sugars four time daily. 1 Each 11    ??? glucose blood VI test strips (ASCENSIA AUTODISC VI, ONE TOUCH ULTRA TEST VI) strip Use to test blood sugars four times daily with Onetouch ultra 2. 400 Strip 3   ??? oxyCODONE IR (ROXICODONE) 10 mg tab immediate release tablet Take 10 mg by mouth every six (6) hours as needed for Pain.     ??? HUMULIN R REGULAR U-100 INSULN 100 unit/mL injection As needed per sliding scale 1 Vial 6   ??? Insulin Syringe-Needle U-100 1 mL 29 gauge x 1/2" syrg Three times per day and as needed as per sliding scale 100 Syringe 6   ??? naloxone (NARCAN) 4 mg/actuation nasal spray Use 1 spray intranasally into 1 nostril. Use a new Narcan nasal spray for subsequent doses and administer into alternating nostrils. May repeat every 2 to 3 minutes as needed for opioid overdose symptoms. 1 Each 0   ??? aspirin 81 mg chewable tablet Take 81 mg by mouth daily.     ??? cholecalciferol, vitamin D3, (VITAMIN D3) 2,000 unit tab Take  by mouth.     ??? B.infantis-B.ani-B.long-B.bifi (PROBIOTIC 4X) 10-15 mg TbEC Take  by mouth.     ??? cyclobenzaprine (FLEXERIL) 10 mg tablet Take  by mouth three (3) times daily as needed for Muscle Spasm(s).         Review of Systems  Constitutional: The patient has no acute distress or discomfort.  HEENT: The patient denies recent head trauma, eye pain, blurred vision,  hearing deficit, oropharyngeal mucosal pain or lesions, and the patient denies throat pain or discomfort.  Lymphatics: The patient denies palpable peripheral lymphadenopathy.  Hematologic: The patient denies having bruising, bleeding, or progressive fatigue.  Respiratory: Patient denies having shortness of breath, cough, sputum production, fever, or dyspnea on exertion.  Cardiovascular: The patient denies having leg pain, leg swelling, heart palpitations, chest permit, chest pain, or lightheadedness.  The patient denies having dyspnea on exertion.  Gastrointestinal: The patient denies having nausea, emesis, or diarrhea.  The patient denies having any hematemesis or blood in the stool.  Genitourinary: Patient denies having urinary urgency, frequency, or dysuria.  The patient denies having blood in the urine.  Psychological: The patient denies having symptoms of nervousness, anxiety, depression, or thoughts of harming self.  Skin: Patient denies having skin rashes, skin, ulcerations, or unexplained itching or pruritus.  Musculoskeletal: The patient denies having pain in the joints or bones.  Objective:     There were no vitals taken for this visit.  ECOG PS=0  Physical Exam:   Gen. Appearance: The patient is in no acute distress.  Skin: There is no bruise or rash.  HEENT: The exam is unremarkable.  Neck: Supple without lymphadenopathy or thyromegaly.  Lungs: Clear to auscultation and percussion; there are no wheezes or rhonchi.  Heart: Regular rate and rhythm; there are no murmurs, gallops, or rubs.  Abdomen: Bowel sounds are present and normal.  There is no guarding, tenderness, or hepatosplenomegaly.  Extremities: There is no clubbing, cyanosis, or edema.  Neurologic: There are no focal neurologic deficits.  Lymphatics: There is no palpable peripheral lymphadenopathy. Musculoskeletal: The patient has full range of motion at all joints.  There is no evidence of joint deformity or effusions.  There is no focal joint tenderness.  Psychological/psychiatric: There is no clinical evidence of anxiety, depression, or melancholy.    Lab data:      Results for orders placed or performed during the hospital encounter of 09/13/18   CBC WITH 3 PART DIFF     Status: Abnormal   Result Value Ref Range Status    WBC 3.5 (L) 4.5 - 13.0 K/uL Final    RBC 3.91 (L) 4.10 - 5.10 M/uL Final    HGB 12.2 12.0 - 16.0 g/dL Final    HCT 35.8 (L) 36 - 48 % Final    MCV 91.6 78 - 102 FL Final    MCH 31.2 25.0 - 35.0 PG Final    MCHC 34.1 31 - 37 g/dL Final    RDW 13.0 11.5 - 14.5 % Final    PLATELET 106 (L) 140 - 440 K/uL Final     NEUTROPHILS 77 (H) 40 - 70 % Final    MIXED CELLS 12 0.1 - 17 % Final    LYMPHOCYTES 12 (L) 14 - 44 % Final    ABS. NEUTROPHILS 2.7 1.8 - 9.5 K/UL Final    ABS. MIXED CELLS 0.4 0.0 - 2.3 K/uL Final    ABS. LYMPHOCYTES 0.4 (L) 1.1 - 5.9 K/UL Final     Comment: Test performed at [facility] or [facility] Location. Reviewed by Medical Director.    DF AUTOMATED   Final           Assessment:     1. 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 Myelodysplastic syndrome ([place])    3. 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  He did receive Procrit at a dose of 60,000 units yesterday.  I will check his iron profile and ferritin levels at this time.  If his ferritin level is below 100 and we will need to give him intravenous iron to replenish his ferritin level.    Chronic leukopenia: The CBC from today shows that his WBC count is 3.3 with an absolute neutrophil count which is normal at 2.6.  However the absolute lymphocyte count is low at 0.5.  These will continue to be monitored.  Therapeutic intervention is not warranted.    Thrombocytopenia: The patient has a stable lytic count of 85,000.  No therapeutic intervention is warranted.    Follow-up in 2 months  Orders Placed This Encounter   ??? IRON PROFILE  Standing Status:   Future     Standing Expiration Date:   0/86/5784   ??? METABOLIC PANEL, COMPREHENSIVE     Standing Status:   Future     Standing Expiration Date:   09/14/2019   ??? FERRITIN     Standing Status:   Future     Standing Expiration Date:   09/14/2019   ??? SPEP     Standing Status:   Future     Standing Expiration Date:   09/14/2019       Nicki Guadalajara, NP  09/13/2018      Please note: This document has been produced using voice recognition  software.  Unrecognized errors in transcription may be present.                                                                                         Myelodysplastic Syndromes: Care Instructions  Your Care Instructions  Myelodysplastic syndromes, also called MDS, are a group of rare conditions in which the bone marrow does not make enough healthy blood cells. Normally, the bone marrow makes red blood cells, white blood cells, and platelets. These cells carry oxygen in the blood, help the body fight infections, and help the blood clot. With MDS, you may feel weak and tired, get infections often, and bruise easily, although symptoms tend to vary.  MDS is a form of blood cancer. In some cases, MDS can turn into acute myeloid leukemia (AML), another type of cancer. Some people develop MDS after treatment for cancer or exposure to pesticides or other chemicals. But in most cases, the cause of MDS is not known.  Your doctor will use the results of blood tests to guide your treatment. There are many types of MDS, with different treatment plans for each. If you have enough red blood cells and are feeling all right, you may not need active treatment, but you and your doctor will want to watch your condition carefully. If you start feeling lightheaded and have no energy, you may need a blood transfusion. Your doctor also may give you antibiotics to prevent or treat infection.  Follow-up care is a key part of your treatment and safety. Be sure to make and go to all appointments, and call your doctor if you are having problems. It's also a good idea to know your test results and keep a list of the medicines you take.  How can you care for yourself at home?  ?? Take your medicines exactly as prescribed. Call your doctor if you think you are having a problem with your medicine. You will get more details on the specific medicines your doctor prescribes.  ?? If your doctor prescribed antibiotics, take them as directed. Do not  stop taking them just because you feel better. You need to take the full course of antibiotics. If you have side effects from antibiotics, tell your doctor.  ?? Take steps to control your stress and workload. Learn relaxation techniques.  ? Share your feelings. Stress and tension affect our emotions. By expressing your feelings to others, you may be able to understand and cope with them.  ? Consider  joining a support group. Talking about a problem with your spouse, a good friend, or other people with similar problems is a good way to reduce tension and stress.  ? Express yourself with art. Try writing, crafts, dance, or art to relieve stress.  ? Be kind to your body and mind. Getting enough sleep, eating a healthy diet, and taking time to do things you enjoy can contribute to an overall feeling of balance in your life and help reduce stress.  ? Get help if you need it. Discuss your concerns with your doctor or counselor.  ?? Do not smoke. Smoking can make blood problems worse. If you need help quitting, talk to your doctor about stop-smoking programs and medicines. These can increase your chances of quitting for good.  ?? If you have not already done so, prepare a list of advance directives. Advance directives are instructions to your doctor and family members about what kind of care you want if you become unable to speak or express yourself.  ?? Call the [facility] [phone]) or visit its website at [email or web address] for more information.  When should you call for help?  Call 911 anytime you think you may need emergency care. For example, call if:  ?? ?? You passed out (lost consciousness).   ??Call your doctor now or seek immediate medical care if:  ?? ?? You have a fever.   ?? ?? You have abnormal bleeding.   ?? ?? You have new or worse pain.   ?? ?? You think you have an infection.   ?? ?? You have new symptoms, such as a cough, belly pain, vomiting, diarrhea, or a rash.    ??Watch closely for changes in your health, and be sure to contact your doctor if:  ?? ?? You are much more tired than usual.   ?? ?? You have swollen glands in your armpits, groin, or neck.   ?? ?? You do not get better as expected.   Where can you learn more?  Go to [facility]  Enter U281 in the search box to learn more about "Myelodysplastic Syndromes: Care Instructions."  Current as of: August 21, 2019Content Version: 12.4  ?? 2006-2020 [employer].  Care instructions adapted under license by [employer] (which disclaims liability or warranty for this information). If you have questions about a medical condition or this instruction, always ask your healthcare professional. [facility] any warranty or liability for your use of this information.    Hematology/Oncology  Progress Note    Name: Ramzy Cappelletti Sr.  Date: 09/13/2018  DOB: 1946/03/27    PCP: [name], MD     Mr. Tippen is a 73 y.o.-year-old man who has myelodysplastic syndrome, chronic leukopenia, and severe anemia.  Current therapy: Procrit 60,000 units every 2 weeks when the hemoglobin is below 10 g/dL.    Subjective:     Mr. Helbert is a 73 year old man who has myelodysplastic syndrome with severe anemia.  He also has chronic leukopenia and thrombocytopenia.  He is currently receiving Procrit 60,000 units every 2 weeks. His wife reports he is eating well and sleeping better. He denies tiredness, weakness, and fatigue. He denies shortness of breath, chest pain, and dizziness. He does not have any concerns or complaints to report at this time.  Since his last clinic visit he received 4 doses of intravenous Venofer after he was found to have a ferritin level of 8 ng/mL.  The iron therapy was  tolerated well. Patient is here for follow up. His wife is with him today. Wife said that patient is no longer sleepy. She said that  they  were able to put up a christmas tree this year. She also said that he seldomly takes the anti-anxiety medication. He denies having fever, night sweat, skin rashes and recent infection.He has no other physical complaints to report at this time.      Past medical history, family history, and social history: these were reviewed and remains unchanged.    Past Medical History:   Diagnosis Date   ??? Anemia    ??? Anxiety    ??? Chronic pain    ??? Cirrhosis of liver ([place])    ??? Diabetes ([place])    ??? GERD (gastroesophageal reflux disease)    ??? Hyperlipemia    ??? Hypertension    ??? Hypotension    ??? Migraine    ??? Other cirrhosis of liver ([place])    ??? Stroke [facility])     had 3 strokes    ??? Thrombocytopenia ([place])      Past Surgical History:   Procedure Laterality Date   ??? HX CHOLECYSTECTOMY     ??? HX ORTHOPAEDIC      R hand sx   ??? HX OTHER SURGICAL      Hand surgery- Nail gun went through finger     Social History     Socioeconomic History   ??? Marital status: MARRIED     Spouse name: Not on file   ??? Number of children: Not on file   ??? Years of education: Not on file   ??? Highest education level: Not on file   Occupational History   ??? Not on file   Social Needs   ??? Financial resource strain: Not on file   ??? Food insecurity     Worry: Not on file     Inability: Not on file   ??? Transportation needs     Medical: Not on file     Non-medical: Not on file   Tobacco Use   ??? Smoking status: Former Smoker   ??? Smokeless tobacco: Never Used   ??? Tobacco comment: quit years ago   Substance and Sexual Activity   ??? Alcohol use: No   ??? Drug use: No   ??? Sexual activity: Never     Partners: Male   Lifestyle   ??? Physical activity     Days per week: Not on file     Minutes per session: Not on file   ??? Stress: Not on file   Relationships   ??? Social Product manager on phone: Not on file     Gets together: Not on file     Attends religious service: Not on file     Active member of club or organization: Not on file      Attends meetings of clubs or organizations: Not on file     Relationship status: Not on file   ??? Intimate partner violence     Fear of current or ex partner: Not on file     Emotionally abused: Not on file     Physically abused: Not on file     Forced sexual activity: Not on file   Other Topics Concern   ??? Not on file   Social History Narrative    ** Merged History Encounter **          Family History  Problem Relation Age of Onset   ??? Alzheimer Mother    ??? Diabetes Mother    ??? Cancer Mother         colon cancer    ??? Hypertension Father    ??? Heart Disease Father    ??? Cancer Father         prostate, lung cancer    ??? Diabetes Sister    ??? Heart Disease Sister    ??? Diabetes Brother      Current Outpatient Medications   Medication Sig Dispense Refill   ??? propranolol (INDERAL) 20 mg tablet Take 1 Tab by mouth two (2) times a day for 90 days. 180 Tab 0   ??? Insulin Syringe-Needle U-100 (BD INSULIN SYRINGE ULTRA-FINE) 0.3 mL 31 gauge x 5/16" syrg Use 4 times daily to inject insulin 400 Syringe 1   ??? LORazepam (ATIVAN) 1 mg tablet Take 1 Tab by mouth daily for 90 days. Max Daily Amount: 1 mg. 90 Tab 0   ??? Insulin Needles, Disposable, (BD ULTRA-FINE SHORT PEN NEEDLE) 31 gauge x 5/16" ndle Use 4 times daily to inject insulin. 1 Package 11   ??? Blood-Glucose Meter (ONETOUCH ULTRA2 METER) misc Use to check blood sugar BID 1 Each 0   ??? metFORMIN (GLUCOPHAGE) 500 mg tablet Take 1 Tab by mouth daily (with lunch) for 180 days. 180 Tab 0   ??? rosuvastatin (CRESTOR) 10 mg tablet TAKE 1 TABLET BY MOUTH NIGHTLY 90 Tab 0   ??? venlafaxine (EFFEXOR) 37.5 mg tablet Take 37.5 mg by mouth three (3) times daily.     ??? venlafaxine (EFFEXOR) 75 mg tablet Take 25 mg by mouth three (3) times daily.     ??? esomeprazole (NEXIUM) 20 mg capsule Take  by mouth daily.     ??? metoclopramide HCl (REGLAN) 10 mg tablet Take 10 mg by mouth Before breakfast, lunch, dinner and at bedtime.      ??? gemfibrozil (LOPID) 600 mg tablet TAKE 1 TABLET BY MOUTH TWICE DAILY 180 Tab 0   ??? sucralfate (CARAFATE) 1 gram tablet TAKE 1 TABLET BY MOUTH FOUR TIMES DAILY WITH MEALS AND AT BEDTIME 360 Tab 0   ??? topiramate (TOPAMAX) 100 mg tablet TAKE 1 TABLET BY MOUTH DAILY 90 Tab 0   ??? GENERLAC 10 gram/15 mL solution TAKE 30ML BY MOUTH TWICE DAILY 5676 mL 0   ??? citalopram (CELEXA) 20 mg tablet TAKE 1 TABLET BY MOUTH DAILY FOR 2 WEEKS THEN TAKE 1 TABLET BY MOUTH EVERY OTHER DAY 58 Tab 0   ??? prochlorperazine (COMPAZINE) 10 mg tablet TAKE 1/2 TABLET BY MOUTH EVERY 6 HOURS AS NEEDED 30 Tab 0   ??? finasteride (PROSCAR) 5 mg tablet TAKE 1 TABLET BY MOUTH DAILY 90 Tab 0   ??? pramipexole (MIRAPEX) 0.125 mg tablet TAKE 1 TABLET BY MOUTH NIGHTLY 30 Tab 0   ??? lancets (ONETOUCH ULTRASOFT LANCETS) misc Use to test blood sugars four time daily. 1 Each 11   ??? glucose blood VI test strips (ASCENSIA AUTODISC VI, ONE TOUCH ULTRA TEST VI) strip Use to test blood sugars four times daily with Onetouch ultra 2. 400 Strip 3   ??? oxyCODONE IR (ROXICODONE) 10 mg tab immediate release tablet Take 10 mg by mouth every six (6) hours as needed for Pain.     ??? HUMULIN R REGULAR U-100 INSULN 100 unit/mL injection As needed per sliding scale 1 Vial 6   ??? Insulin Syringe-Needle U-100 1 mL 29 gauge x 1/2"  syrg Three times per day and as needed as per sliding scale 100 Syringe 6   ??? naloxone (NARCAN) 4 mg/actuation nasal spray Use 1 spray intranasally into 1 nostril. Use a new Narcan nasal spray for subsequent doses and administer into alternating nostrils. May repeat every 2 to 3 minutes as needed for opioid overdose symptoms. 1 Each 0   ??? aspirin 81 mg chewable tablet Take 81 mg by mouth daily.     ??? cholecalciferol, vitamin D3, (VITAMIN D3) 2,000 unit tab Take  by mouth.     ??? B.infantis-B.ani-B.long-B.bifi (PROBIOTIC 4X) 10-15 mg TbEC Take  by mouth.     ??? cyclobenzaprine (FLEXERIL) 10 mg tablet Take  by mouth three (3) times  daily as needed for Muscle Spasm(s).         Review of Systems  Constitutional: The patient has no acute distress or discomfort.  HEENT: The patient denies recent head trauma, eye pain, blurred vision,  hearing deficit, oropharyngeal mucosal pain or lesions, and the patient denies throat pain or discomfort.  Lymphatics: The patient denies palpable peripheral lymphadenopathy.  Hematologic: The patient denies having bruising, bleeding, or progressive fatigue.  Respiratory: Patient denies having shortness of breath, cough, sputum production, fever, or dyspnea on exertion.  Cardiovascular: The patient denies having leg pain, leg swelling, heart palpitations, chest permit, chest pain, or lightheadedness.  The patient denies having dyspnea on exertion.  Gastrointestinal: The patient denies having nausea, emesis, or diarrhea. The patient denies having any hematemesis or blood in the stool.  Genitourinary: Patient denies having urinary urgency, frequency, or dysuria.  The patient denies having blood in the urine.  Psychological: The patient denies having symptoms of nervousness, anxiety, depression, or thoughts of harming self.  Skin: Patient denies having skin rashes, skin, ulcerations, or unexplained itching or pruritus.  Musculoskeletal: The patient denies having pain in the joints or bones.      Objective:     There were no vitals taken for this visit.  ECOG PS=0  Physical Exam:   Gen. Appearance: The patient is in no acute distress.  Skin: There is no bruise or rash.  HEENT: The exam is unremarkable.  Neck: Supple without lymphadenopathy or thyromegaly.  Lungs: Clear to auscultation and percussion; there are no wheezes or rhonchi.  Heart: Regular rate and rhythm; there are no murmurs, gallops, or rubs.  Abdomen: Bowel sounds are present and normal.  There is no guarding, tenderness, or hepatosplenomegaly.  Extremities: There is no clubbing, cyanosis, or  edema.  Neurologic: There are no focal neurologic deficits.  Lymphatics: There is no palpable peripheral lymphadenopathy. Musculoskeletal: The patient has full range of motion at all joints.  There is no evidence of joint deformity or effusions.  There is no focal joint tenderness.  Psychological/psychiatric: There is no clinical evidence of anxiety, depression, or melancholy.    Lab data:      Results for orders placed or performed during the hospital encounter of 09/13/18   CBC WITH 3 PART DIFF     Status: Abnormal   Result Value Ref Range Status    WBC 3.5 (L) 4.5 - 13.0 K/uL Final    RBC 3.91 (L) 4.10 - 5.10 M/uL Final    HGB 12.2 12.0 - 16.0 g/dL Final    HCT 35.8 (L) 36 - 48 % Final    MCV 91.6 78 - 102 FL Final    MCH 31.2 25.0 - 35.0 PG Final    MCHC 34.1 31 - 37  g/dL Final    RDW 13.0 11.5 - 14.5 % Final    PLATELET 106 (L) 140 - 440 K/uL Final    NEUTROPHILS 77 (H) 40 - 70 % Final    MIXED CELLS 12 0.1 - 17 % Final    LYMPHOCYTES 12 (L) 14 - 44 % Final    ABS. NEUTROPHILS 2.7 1.8 - 9.5 K/UL Final    ABS. MIXED CELLS 0.4 0.0 - 2.3 K/uL Final    ABS. LYMPHOCYTES 0.4 (L) 1.1 - 5.9 K/UL Final     Comment: Test performed at [place] or [facility] Location. Reviewed by Medical Director.    DF AUTOMATED   Final           Assessment:     1. 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 Myelodysplastic syndrome ([place])    3. Chronic leukopenia      Plan:     Thrombocytopenia: The patient has a stable platelet count of 79,000 K/uL. No therapeutic intervention is warranted.    Chronic leukopenia: The CBC from today shows that his WBC count is 3.9 K/uL with an absolute neutrophil count which is normal at 3.0 K/UL. However the absolute lymphocyte count is low at 0.5 K/UL. We will continue to monitor every 2 months. Therapeutic intervention is not warranted. Infection prevention was discussed with patient and his wife.       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  This is a significant improvement after receiving intravenous iron for 4 doses in the form of Venofer.  I will check his iron profile and ferritin levels at this time along with comprehensive metabolic panel. If his ferritin level is below 25 we will give him intravenous iron to replenish his ferritin level. Patient is currently being monitored for Procrit injection every 2 weeks. Patient said that he has not had Procrit injection since February, 2019, therefore, assessment for Procrit injection was changed from every 2 weeks to every 4 weeks. Patient is aware that if his hemoglobin and hematocrit go below 10 g/dL and 30 % respectively, he will be given Procrit 60,000 units.      Follow-up in 3 months or sooner if indicated.    Orders Placed This Encounter   ??? IRON PROFILE     Standing Status:   Future     Standing Expiration Date:   12/12/735   ??? METABOLIC PANEL, COMPREHENSIVE     Standing Status:   Future     Standing Expiration Date:   09/14/2019   ??? FERRITIN     Standing Status:   Future     Standing Expiration Date:   09/14/2019   ??? SPEP     Standing Status:   Future     Standing Expiration Date:   09/14/2019         Nicki Guadalajara, NP  09/13/2018       I have assessed the patient independently and  agree with the full assessment as outlined.  Joycelyn Das, MD, [facility]  Hematology/Oncology  Progress Note    Name: William Maroney Sr.  Date: 09/13/2018  DOB: [birth year]    PCP: [name], MD     Mr. Baby is a 35 y.o.-year-old man who has myelodysplastic syndrome, chronic leukopenia, and severe anemia.  Current therapy: Procrit 60,000 units every 2 weeks when the hemoglobin is below 10 g/dL.    Subjective:      Mr. Schiefelbein is a 73 year old man who has myelodysplastic syndrome with severe anemia.  He also has chronic leukopenia and thrombocytopenia.  He is currently receiving Procrit 60,000 units every 2 weeks.  Today he is complaining of some fatigue and weakness.  His wife reports that although he is eating well he is continuing to lose weight and he is hypersomnolent, sleeping all of the time.    Past medical history, family history, and social history: these were reviewed and remains unchanged.    Past Medical History:   Diagnosis Date   ??? Anemia    ??? Anxiety    ??? Chronic pain    ??? Cirrhosis of liver ([place])    ??? Diabetes ([place])    ??? GERD (gastroesophageal reflux disease)    ??? Hyperlipemia    ??? Hypertension    ??? Hypotension    ??? Migraine    ??? Other cirrhosis of liver ([place])    ??? Stroke [facility])     had 3 strokes    ??? Thrombocytopenia ([place])      Past Surgical History:   Procedure Laterality Date   ??? HX CHOLECYSTECTOMY     ??? HX ORTHOPAEDIC      R hand sx   ??? HX OTHER SURGICAL      Hand surgery- Nail gun went through finger     Social History     Socioeconomic History   ??? Marital status: MARRIED     Spouse name: Not on file   ??? Number of children: Not on file   ??? Years of education: Not on file   ??? Highest education level: Not on file   Occupational History   ??? Not on file   Social Needs   ??? Financial resource strain: Not on file   ??? Food insecurity     Worry: Not on file     Inability: Not on file   ??? Transportation needs     Medical: Not on file     Non-medical: Not on file   Tobacco Use   ??? Smoking status: Former Smoker   ??? Smokeless tobacco: Never Used   ??? Tobacco comment: quit years ago   Substance and Sexual Activity   ??? Alcohol use: No   ??? Drug use: No   ??? Sexual activity: Never     Partners: Male   Lifestyle   ??? Physical activity     Days per week: Not on file     Minutes per session: Not on file   ??? Stress: Not on file   Relationships   ??? Social Product manager on phone: Not on file     Gets together: Not on file      Attends religious service: Not on file     Active member of club or organization: Not on file     Attends meetings of clubs or organizations: Not on file     Relationship status: Not on file   ??? Intimate partner violence     Fear of current or ex partner:  Not on file     Emotionally abused: Not on file     Physically abused: Not on file     Forced sexual activity: Not on file   Other Topics Concern   ??? Not on file   Social History Narrative    ** Merged History Encounter **          Family History   Problem Relation Age of Onset   ??? Alzheimer Mother    ??? Diabetes Mother    ??? Cancer Mother         colon cancer    ??? Hypertension Father    ??? Heart Disease Father    ??? Cancer Father         prostate, lung cancer    ??? Diabetes Sister    ??? Heart Disease Sister    ??? Diabetes Brother      Current Outpatient Medications   Medication Sig Dispense Refill   ??? propranolol (INDERAL) 20 mg tablet Take 1 Tab by mouth two (2) times a day for 90 days. 180 Tab 0   ??? Insulin Syringe-Needle U-100 (BD INSULIN SYRINGE ULTRA-FINE) 0.3 mL 31 gauge x 5/16" syrg Use 4 times daily to inject insulin 400 Syringe 1   ??? LORazepam (ATIVAN) 1 mg tablet Take 1 Tab by mouth daily for 90 days. Max Daily Amount: 1 mg. 90 Tab 0   ??? Insulin Needles, Disposable, (BD ULTRA-FINE SHORT PEN NEEDLE) 31 gauge x 5/16" ndle Use 4 times daily to inject insulin. 1 Package 11   ??? Blood-Glucose Meter (ONETOUCH ULTRA2 METER) misc Use to check blood sugar BID 1 Each 0   ??? metFORMIN (GLUCOPHAGE) 500 mg tablet Take 1 Tab by mouth daily (with lunch) for 180 days. 180 Tab 0   ??? rosuvastatin (CRESTOR) 10 mg tablet TAKE 1 TABLET BY MOUTH NIGHTLY 90 Tab 0   ??? venlafaxine (EFFEXOR) 37.5 mg tablet Take 37.5 mg by mouth three (3) times daily.     ??? venlafaxine (EFFEXOR) 75 mg tablet Take 25 mg by mouth three (3) times daily.     ??? esomeprazole (NEXIUM) 20 mg capsule Take  by mouth daily.     ??? metoclopramide HCl (REGLAN) 10 mg tablet Take 10 mg by mouth Before  breakfast, lunch, dinner and at bedtime.     ??? gemfibrozil (LOPID) 600 mg tablet TAKE 1 TABLET BY MOUTH TWICE DAILY 180 Tab 0   ??? sucralfate (CARAFATE) 1 gram tablet TAKE 1 TABLET BY MOUTH FOUR TIMES DAILY WITH MEALS AND AT BEDTIME 360 Tab 0   ??? topiramate (TOPAMAX) 100 mg tablet TAKE 1 TABLET BY MOUTH DAILY 90 Tab 0   ??? GENERLAC 10 gram/15 mL solution TAKE 30ML BY MOUTH TWICE DAILY 5676 mL 0   ??? citalopram (CELEXA) 20 mg tablet TAKE 1 TABLET BY MOUTH DAILY FOR 2 WEEKS THEN TAKE 1 TABLET BY MOUTH EVERY OTHER DAY 58 Tab 0   ??? prochlorperazine (COMPAZINE) 10 mg tablet TAKE 1/2 TABLET BY MOUTH EVERY 6 HOURS AS NEEDED 30 Tab 0   ??? finasteride (PROSCAR) 5 mg tablet TAKE 1 TABLET BY MOUTH DAILY 90 Tab 0   ??? pramipexole (MIRAPEX) 0.125 mg tablet TAKE 1 TABLET BY MOUTH NIGHTLY 30 Tab 0   ??? lancets (ONETOUCH ULTRASOFT LANCETS) misc Use to test blood sugars four time daily. 1 Each 11   ??? glucose blood VI test strips (ASCENSIA AUTODISC VI, ONE TOUCH ULTRA TEST VI) strip Use to test blood sugars  four times daily with Onetouch ultra 2. 400 Strip 3   ??? oxyCODONE IR (ROXICODONE) 10 mg tab immediate release tablet Take 10 mg by mouth every six (6) hours as needed for Pain.     ??? HUMULIN R REGULAR U-100 INSULN 100 unit/mL injection As needed per sliding scale 1 Vial 6   ??? Insulin Syringe-Needle U-100 1 mL 29 gauge x 1/2" syrg Three times per day and as needed as per sliding scale 100 Syringe 6   ??? naloxone (NARCAN) 4 mg/actuation nasal spray Use 1 spray intranasally into 1 nostril. Use a new Narcan nasal spray for subsequent doses and administer into alternating nostrils. May repeat every 2 to 3 minutes as needed for opioid overdose symptoms. 1 Each 0   ??? aspirin 81 mg chewable tablet Take 81 mg by mouth daily.     ??? cholecalciferol, vitamin D3, (VITAMIN D3) 2,000 unit tab Take  by mouth.     ??? B.infantis-B.ani-B.long-B.bifi (PROBIOTIC 4X) 10-15 mg TbEC Take  by mouth.      ??? cyclobenzaprine (FLEXERIL) 10 mg tablet Take  by mouth three (3) times daily as needed for Muscle Spasm(s).         Review of Systems  Constitutional: The patient has no acute distress or discomfort.  HEENT: The patient denies recent head trauma, eye pain, blurred vision,  hearing deficit, oropharyngeal mucosal pain or lesions, and the patient denies throat pain or discomfort.  Lymphatics: The patient denies palpable peripheral lymphadenopathy.  Hematologic: The patient denies having bruising, bleeding, or progressive fatigue.  Respiratory: Patient denies having shortness of breath, cough, sputum production, fever, or dyspnea on exertion.  Cardiovascular: The patient denies having leg pain, leg swelling, heart palpitations, chest permit, chest pain, or lightheadedness.  The patient denies having dyspnea on exertion.  Gastrointestinal: The patient denies having nausea, emesis, or diarrhea. The patient denies having any hematemesis or blood in the stool.  Genitourinary: Patient denies having urinary urgency, frequency, or dysuria.  The patient denies having blood in the urine.  Psychological: The patient denies having symptoms of nervousness, anxiety, depression, or thoughts of harming self.  Skin: Patient denies having skin rashes, skin, ulcerations, or unexplained itching or pruritus.  Musculoskeletal: The patient denies having pain in the joints or bones.      Objective:     There were no vitals taken for this visit.  ECOG PS=0  Physical Exam:   Gen. Appearance: The patient is in no acute distress.  Skin: There is no bruise or rash.  HEENT: The exam is unremarkable.  Neck: Supple without lymphadenopathy or thyromegaly.  Lungs: Clear to auscultation and percussion; there are no wheezes or rhonchi.  Heart: Regular rate and rhythm; there are no murmurs, gallops, or rubs.  Abdomen: Bowel sounds are present and normal.  There is no guarding, tenderness, or  hepatosplenomegaly.  Extremities: There is no clubbing, cyanosis, or edema.  Neurologic: There are no focal neurologic deficits.  Lymphatics: There is no palpable peripheral lymphadenopathy. Musculoskeletal: The patient has full range of motion at all joints.  There is no evidence of joint deformity or effusions.  There is no focal joint tenderness.  Psychological/psychiatric: There is no clinical evidence of anxiety, depression, or melancholy.    Lab data:      Results for orders placed or performed during the hospital encounter of 09/13/18   CBC WITH 3 PART DIFF     Status: Abnormal   Result Value Ref Range Status  WBC 3.5 (L) 4.5 - 13.0 K/uL Final    RBC 3.91 (L) 4.10 - 5.10 M/uL Final    HGB 12.2 12.0 - 16.0 g/dL Final    HCT 35.8 (L) 36 - 48 % Final    MCV 91.6 78 - 102 FL Final    MCH 31.2 25.0 - 35.0 PG Final    MCHC 34.1 31 - 37 g/dL Final    RDW 13.0 11.5 - 14.5 % Final    PLATELET 106 (L) 140 - 440 K/uL Final    NEUTROPHILS 77 (H) 40 - 70 % Final    MIXED CELLS 12 0.1 - 17 % Final    LYMPHOCYTES 12 (L) 14 - 44 % Final    ABS. NEUTROPHILS 2.7 1.8 - 9.5 K/UL Final    ABS. MIXED CELLS 0.4 0.0 - 2.3 K/uL Final    ABS. LYMPHOCYTES 0.4 (L) 1.1 - 5.9 K/UL Final     Comment: Test performed at [place] or [facility] Location. Reviewed by Medical Director.    DF AUTOMATED   Final           Assessment:     1. 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 Myelodysplastic syndrome ([place])    3. 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  He did receive Procrit at a dose of 60,000 units yesterday.  I will check his iron profile and ferritin levels at this time.  If his ferritin level is below 100 and we will need to give him intravenous iron to replenish his  ferritin level.    Chronic leukopenia: The CBC from today shows that his WBC count is 3.3 with an absolute neutrophil count which is normal at 2.6.  However the absolute lymphocyte count is low at 0.5.  These will continue to be monitored.  Therapeutic intervention is not warranted.    Thrombocytopenia: The patient has a stable lytic count of 85,000.  No therapeutic intervention is warranted.    Follow-up in 2 months  Orders Placed This Encounter   ??? IRON PROFILE     Standing Status:   Future     Standing Expiration Date:   1/61/0960   ??? METABOLIC PANEL, COMPREHENSIVE     Standing Status:   Future     Standing Expiration Date:   09/14/2019   ??? FERRITIN     Standing Status:   Future     Standing Expiration Date:   09/14/2019   ??? SPEP     Standing Status:   Future     Standing Expiration Date:   09/14/2019       Nicki Guadalajara, NP  09/13/2018      Please note: This document has been produced using voice recognition software.  Unrecognized errors in transcription may be present.

## 2018-09-13 NOTE — Progress Notes (Signed)
[facility] Progress Note    Date: September 13, 2018    Name: Ricke Kimoto [facility].    MRN: [id number]         DOB: 06/27/1946      Mr. Korus was assessed and education was provided.     Mr. Fox vitals were reviewed and patient was observed for 5 minutes prior to treatment.   Visit Vitals  BP 118/69 (BP 1 Location: Left arm, BP Patient Position: At rest;Sitting)   Pulse 62   Temp 97.7 ??F (36.5 ??C)   Resp 16       Lab results were obtained and reviewed.  Recent Results (from the past 12 hour(s))   CBC WITH 3 PART DIFF    Collection Time: 09/13/18  1:37 PM   Result Value Ref Range    WBC 3.5 (L) 4.5 - 13.0 K/uL    RBC 3.91 (L) 4.10 - 5.10 M/uL    HGB 12.2 12.0 - 16.0 g/dL    HCT 35.8 (L) 36 - 48 %    MCV 91.6 78 - 102 FL    MCH 31.2 25.0 - 35.0 PG    MCHC 34.1 31 - 37 g/dL    RDW 13.0 11.5 - 14.5 %    PLATELET 106 (L) 140 - 440 K/uL    NEUTROPHILS 77 (H) 40 - 70 %    MIXED CELLS 12 0.1 - 17 %    LYMPHOCYTES 12 (L) 14 - 44 %    ABS. NEUTROPHILS 2.7 1.8 - 9.5 K/UL    ABS. MIXED CELLS 0.4 0.0 - 2.3 K/uL    ABS. LYMPHOCYTES 0.4 (L) 1.1 - 5.9 K/UL    DF AUTOMATED         Retacrit not given for H&H 12.2/35.8.     Patient armband removed and shredded.    Mr. Nevares was discharged from [facility] in stable condition at 1350. He is to return on 10/11/18 at 1300 for his next appointment for CBC/Retacrit Q 4 wks.      Lynnda Shields, RN  September 13, 2018

## 2018-09-14 ENCOUNTER — Encounter

## 2018-09-14 ENCOUNTER — Telehealth

## 2018-09-14 LAB — METABOLIC PANEL, COMPREHENSIVE
A-G Ratio: 1.2 (ref 0.8–1.7)
ALT (SGPT): 38 U/L (ref 16–61)
AST (SGOT): 34 U/L (ref 10–38)
Albumin: 4 g/dL (ref 3.4–5.0)
Alk. phosphatase: 232 U/L — ABNORMAL HIGH (ref 45–117)
Anion gap: 6 mmol/L (ref 3.0–18)
BUN/Creatinine ratio: 19 (ref 12–20)
BUN: 17 MG/DL (ref 7.0–18)
Bilirubin, total: 0.4 MG/DL (ref 0.2–1.0)
CO2: 24 mmol/L (ref 21–32)
Calcium: 9.3 MG/DL (ref 8.5–10.1)
Chloride: 110 mmol/L (ref 100–111)
Creatinine: 0.91 MG/DL (ref 0.6–1.3)
GFR est AA: >60 mL/min/{1.73_m2} (ref >60)
GFR est non-AA: >60 mL/min/{1.73_m2} (ref >60)
Globulin: 3.4 g/dL (ref 2.0–4.0)
Glucose: 97 mg/dL (ref 74–99)
Potassium: 4.7 mmol/L (ref 3.5–5.5)
Protein, total: 7.4 g/dL (ref 6.4–8.2)
Sodium: 140 mmol/L (ref 136–145)

## 2018-09-14 LAB — IRON PROFILE
Iron % saturation: 14 % — ABNORMAL LOW (ref 20–50)
Iron: 56 ug/dL (ref 50–175)
TIBC: 402 ug/dL (ref 250–450)

## 2018-09-14 LAB — FERRITIN: Ferritin: 27 NG/ML (ref 8–388)

## 2018-09-14 NOTE — Telephone Encounter (Signed)
Spoke with patient's wife, Mardene Celeste. She verbalized understanding and will do blood sugar checks as advised.

## 2018-09-14 NOTE — Telephone Encounter (Signed)
Patient's wife called stating that the patient's blood sugar has been between 200-300. She believes it may be stress related. She would like to know if maybe he needed to increase his metformin?

## 2018-09-14 NOTE — Telephone Encounter (Signed)
I will send a flowsheet by my chart so they can fill it with a fasting blood sugar daily and then to check it again 2 hours after lunch.  Will evaluate 1 week course of reading and then decide on management

## 2018-09-14 NOTE — Progress Notes (Signed)
Results reviewed and noted. Will schedule patient for injectafer infusion.

## 2018-09-15 LAB — PROTEIN ELECTROPHORESIS
A/G ratio: 1.4 (ref 0.7–1.7)
ALPHA-2 GLOBULIN: 0.8 g/dL (ref 0.4–1.0)
Albumin: 4.2 g/dL (ref 2.9–4.4)
Alpha-1-globulin: 0.3 g/dL (ref 0.0–0.4)
Beta globulin: 1 g/dL (ref 0.7–1.3)
Gamma globulin: 0.8 g/dL (ref 0.4–1.8)
Globulin, total: 2.9 g/dL (ref 2.2–3.9)
Protein, total: 7.1 g/dL (ref 6.0–8.5)

## 2018-09-15 NOTE — Telephone Encounter (Signed)
Patient should not be taking Compazine together with Reglan( metoclopramide) due to side effects    So patient has to decide between the 2 medications, or should not use them on the same day.

## 2018-09-15 NOTE — Telephone Encounter (Signed)
LVM for patient or patient's wife to return call to discuss this refill request.

## 2018-09-15 NOTE — Progress Notes (Signed)
Talked to patient's wife today about patient's recent Ferritin and iron levels. I told wife that I am ordering iron infusion in the form of injectafer.

## 2018-09-18 NOTE — Telephone Encounter (Signed)
Spoke with patient and he states he does not take the medications together. He is asking for this med to be refilled.

## 2018-09-19 ENCOUNTER — Encounter: Attending: Vascular Surgery | Primary: Legal Medicine

## 2018-09-19 NOTE — Telephone Encounter (Signed)
OK  Noted

## 2018-09-20 MED ORDER — LACTULOSE 10 GRAM/15 ML ORAL SOLN
10 gram/15 mL | Freq: Two times a day (BID) | ORAL | 1 refills | Status: DC
Start: 2018-09-20 — End: 2019-11-20

## 2018-09-20 MED ORDER — PROCHLORPERAZINE MALEATE 10 MG TAB
10 mg | ORAL_TABLET | ORAL | 0 refills | Status: DC
Start: 2018-09-20 — End: 2019-03-15

## 2018-09-24 ENCOUNTER — Encounter

## 2018-09-25 ENCOUNTER — Encounter

## 2018-09-25 MED ORDER — SUCRALFATE 1 GRAM TAB
1 gram | ORAL_TABLET | ORAL | 0 refills | Status: DC
Start: 2018-09-25 — End: 2019-01-10

## 2018-09-25 MED ORDER — GEMFIBROZIL 600 MG TAB
600 mg | ORAL_TABLET | ORAL | 0 refills | Status: DC
Start: 2018-09-25 — End: 2018-12-25

## 2018-09-25 MED ORDER — TOPIRAMATE 100 MG TAB
100 mg | ORAL_TABLET | ORAL | 0 refills | Status: DC
Start: 2018-09-25 — End: 2018-12-25

## 2018-09-25 MED ORDER — FINASTERIDE 5 MG TAB
5 mg | ORAL_TABLET | ORAL | 0 refills | Status: DC
Start: 2018-09-25 — End: 2018-12-25

## 2018-09-25 NOTE — Telephone Encounter (Signed)
Spoke with patient's wife and she verbalized understanding.    She states they are really appreciative of our office.

## 2018-09-25 NOTE — Telephone Encounter (Signed)
Pt's wife  called to report blood sugar readings    3/20  Before breakfast 171 2hrs after lunch 214 dinner 130    3/21  Before breakfast 177 2hrs after lunch 261 dinner 160    3/22  Before breakfast 159 2hrs after lunch 220 dinner 125    3/23  Before breakfast 186 2hrs after lunch 277 dinner 115    3/24  Before breakfast 188 2hrs after lunch( no reading) dinner233    3/25 before breakfast 226 2hrs after lunch 215 (no dinner reading)    3/26  Before breakfast 152 2hrs after lunch 206 dinner 151    3/27  Before breakfast 192 2hrs after lunch 202 dinner 121    3/28  Before breakfast 237 2hrs after lunch 228 dinner 191

## 2018-09-25 NOTE — Telephone Encounter (Signed)
To increase metformin to 500 mg twice a day with meals

## 2018-09-27 ENCOUNTER — Encounter: Payer: MEDICARE | Primary: Legal Medicine

## 2018-09-27 ENCOUNTER — Encounter

## 2018-10-04 ENCOUNTER — Encounter

## 2018-10-04 ENCOUNTER — Inpatient Hospital Stay: Admit: 2018-10-04 | Payer: MEDICARE | Primary: Legal Medicine

## 2018-10-04 DIAGNOSIS — D464 Refractory anemia, unspecified: Secondary | ICD-10-CM

## 2018-10-04 DIAGNOSIS — D508 Other iron deficiency anemias: Secondary | ICD-10-CM

## 2018-10-04 MED ORDER — FERRIC CARBOXYMALTOSE 50 MG IRON/ML INTRAVENOUS SOLUTION
50 iron mg/mL | Freq: Once | INTRAVENOUS | Status: AC
Start: 2018-10-04 — End: 2018-10-04
  Administered 2018-10-04: 15:00:00 via INTRAVENOUS

## 2018-10-04 MED ORDER — EPOETIN ALFA-EPBX 40,000 UNIT/ML INJECTION SOLUTION
40000 unit/mL | Freq: Once | INTRAMUSCULAR | Status: DC
Start: 2018-10-04 — End: 2018-10-04

## 2018-10-04 MED ORDER — SODIUM CHLORIDE 0.9 % IJ SYRG
INTRAMUSCULAR | Status: DC | PRN
Start: 2018-10-04 — End: 2018-10-08
  Administered 2018-10-04 (×2): via INTRAVENOUS

## 2018-10-04 MED ORDER — SODIUM CHLORIDE 0.9 % IV
INTRAVENOUS | Status: AC
Start: 2018-10-04 — End: 2018-10-04

## 2018-10-04 MED FILL — INJECTAFER 50 MG IRON/ML INTRAVENOUS SOLUTION: 50 mg iron/mL | INTRAVENOUS | Qty: 15

## 2018-10-04 MED FILL — BD POSIFLUSH NORMAL SALINE 0.9 % INJECTION SYRINGE: INTRAMUSCULAR | Qty: 40

## 2018-10-04 MED FILL — SODIUM CHLORIDE 0.9 % IV: INTRAVENOUS | Qty: 1000

## 2018-10-04 NOTE — Progress Notes (Signed)
[facility] Progress Note    Date: October 04, 2018    Name: William Blanchard [facility] Sr[facility]    MRN: [id number]         DOB: 21-Nov-1945      Mr. Bess was assessed and education was provided.     Mr. Dipiero vitals were reviewed and patient was observed for 5 minutes prior to treatment.   Visit Vitals  BP 117/66 (BP 1 Location: Left arm, BP Patient Position: Standing)   Pulse (!) 57   Temp 97.9 ??F (36.6 ??C)   Resp 16       Injectafer 750 mg IV given over 8-9 minutes.    Mr. Manheim tolerated the infusion, and had no complaints.  Patient armband removed and shredded.    Mr. Poffenberger was discharged from [place] in stable condition at 1215. He is to return on 10/11/2018 at [place] for his next appointment for Injectafer dose #2 of 2 and CBC/Retacrit.    Lynnda Shields, RN  October 04, 2018  1:55 PM

## 2018-10-09 ENCOUNTER — Encounter: Payer: MEDICARE | Primary: Legal Medicine

## 2018-10-11 ENCOUNTER — Encounter

## 2018-10-11 ENCOUNTER — Inpatient Hospital Stay: Admit: 2018-10-11 | Payer: MEDICARE | Primary: Legal Medicine

## 2018-10-11 ENCOUNTER — Encounter: Payer: MEDICARE | Primary: Legal Medicine

## 2018-10-11 LAB — CBC WITH 3 PART DIFF
ABS. LYMPHOCYTES: 0.5 10*3/uL — ABNORMAL LOW (ref 1.1–5.9)
ABS. MIXED CELLS: 0.2 10*3/uL (ref 0.0–2.3)
ABS. NEUTROPHILS: 3.3 10*3/uL (ref 1.8–9.5)
HCT: 37.4 % (ref 36–48)
HGB: 12.1 g/dL (ref 12.0–16)
LYMPHOCYTES: 11 % — ABNORMAL LOW (ref 14–44)
MCH: 29.5 PG (ref 25.0–35.0)
MCHC: 32.4 g/dL (ref 31–37)
MCV: 91.2 FL (ref 78–102)
Mixed cells: 6 % (ref 0.1–17)
NEUTROPHILS: 83 % — ABNORMAL HIGH (ref 40–70)
PLATELET: 103 10*3/uL — ABNORMAL LOW (ref 140–440)
RBC: 4.1 M/uL (ref 4.10–5.10)
RDW: 14.2 % (ref 11.5–14.5)
WBC: 4 10*3/uL — ABNORMAL LOW (ref 4.5–13.0)

## 2018-10-11 MED ORDER — SODIUM CHLORIDE 0.9 % IV
INTRAVENOUS | Status: DC
Start: 2018-10-11 — End: 2018-10-12
  Administered 2018-10-11: 18:00:00 via INTRAVENOUS

## 2018-10-11 MED ORDER — SODIUM CHLORIDE 0.9 % INJECTION
INTRAMUSCULAR | Status: DC | PRN
Start: 2018-10-11 — End: 2018-10-12
  Administered 2018-10-11: 17:00:00 via INTRAVENOUS

## 2018-10-11 MED ORDER — FERRIC CARBOXYMALTOSE 50 MG IRON/ML INTRAVENOUS SOLUTION
50 iron mg/mL | Freq: Once | INTRAVENOUS | Status: AC
Start: 2018-10-11 — End: 2018-10-11
  Administered 2018-10-11 (×2): via INTRAVENOUS

## 2018-10-11 MED ORDER — SALINE PERIPHERAL FLUSH PRN
INTRAMUSCULAR | Status: DC | PRN
Start: 2018-10-11 — End: 2018-10-12
  Administered 2018-10-11: 18:00:00

## 2018-10-11 MED FILL — SODIUM CHLORIDE 0.9 % IV: INTRAVENOUS | Qty: 1000

## 2018-10-11 MED FILL — BD POSIFLUSH NORMAL SALINE 0.9 % INJECTION SYRINGE: INTRAMUSCULAR | Qty: 10

## 2018-10-11 NOTE — Progress Notes (Signed)
[facility] Progress Note    Date: October 11, 2018    Name: William Heathman [facility] Sr.    MRN: [id number]         DOB: 03/29/1946    Retacrit/ Injectafer 2 of 2      William Blanchard arrived to [facility] at 1305, ambulatory. Back pain 9/10    William Blanchard was assessed and education was provided.     William Blanchard vitals were reviewed.  Visit Vitals  BP 114/70 (BP 1 Location: Left arm, BP Patient Position: Sitting)   Pulse 85   Temp 98.3 ??F (36.8 ??C)   Resp 18   SpO2 100%       # 22g IV inserted in patient's RIGHT AC x 1 attempt.  Positive for blood return/ flushes without difficulty. Blood sample obtained for cbc        CBC WITH 3 PART DIFF    Collection Time: 10/11/18  1:20 PM   Result Value Ref Range    WBC 4.0 (L) 4.5 - 13.0 K/uL    RBC 4.10 4.10 - 5.10 M/uL    HGB 12.1 12.0 - 16 g/dL    HCT 37.4 36 - 48 %    MCV 91.2 78 - 102 FL    MCH 29.5 25.0 - 35.0 PG    MCHC 32.4 31 - 37 g/dL    RDW 14.2 11.5 - 14.5 %    PLATELET 103 (L) 140 - 440 K/uL    NEUTROPHILS 83 (H) 40 - 70 %    MIXED CELLS 6 0.1 - 17 %    LYMPHOCYTES 11 (L) 14 - 44 %    ABS. NEUTROPHILS 3.3 1.8 - 9.5 K/UL    ABS. MIXED CELLS 0.2 0.0 - 2.3 K/uL    ABS. LYMPHOCYTES 0.5 (L) 1.1 - 5.9 K/UL    DF AUTOMATED         Retacrit held per parameters      Injectafer 750 mg administered over 20 minutes as ordered followed by NS flush.    William Blanchard tolerated well without complaints from infusion. Pain to back 8/10 prior discharge. He will resume pain medication on return home. He will He declined to stay for observation    Patient Vitals for the past 4 hrs:   Temp Pulse Resp BP SpO2   10/11/18 1359 98.3 ??F (36.8 ??C) 85 18 114/70 100 %   10/11/18 1305 98.1 ??F (36.7 ??C) 81 16 141/69 100 %         IV removed/ intact. Site without redness, swelling, bleeding or tenderness. Gauze/ coban to site.    Reviewed discharge instructions with patient. He verbalized understanding.     William Blanchard was discharged from [facility] in stable condition at 1405  He is to return on 11/08/2018 at 1 pm for his next appointment.    Adonis Housekeeper, RN  October 11, 2018

## 2018-10-17 ENCOUNTER — Encounter

## 2018-10-17 MED ORDER — HUMULIN R REGULAR U-100 INSULIN 100 UNIT/ML INJECTION SOLUTION
100 unit/mL | INTRAMUSCULAR | 3 refills | Status: AC
Start: 2018-10-17 — End: ?

## 2018-10-23 NOTE — Telephone Encounter (Signed)
Patients wife called wanting to let Dr. Phineas Douglas know that Mr. Tabbert has had a stoke and has been admitted to [facility]. Mrs. Apsey was in tears as she told me this. I told her that he would be in our prayers. She just wanted [place] to know because she really likes Dr. Phineas Douglas.

## 2018-10-25 ENCOUNTER — Encounter

## 2018-10-25 ENCOUNTER — Encounter: Payer: MEDICARE | Primary: Legal Medicine

## 2018-10-25 NOTE — Telephone Encounter (Signed)
Noted   thanks

## 2018-10-25 NOTE — Telephone Encounter (Signed)
Pt was d/c from [facility] yesterday and the following medication was changed metformin. Pt was taking 500mg  of metformin twice a day, but the hospital provider switch the medication to 1000 mg twice a day and wife would just like to know if the switch is ok with the pcp

## 2018-10-25 NOTE — Telephone Encounter (Signed)
Please schedule [facility] for hospital follow up

## 2018-10-25 NOTE — Telephone Encounter (Signed)
Appointment schedule.    Future Appointments   Date Time Provider [place]   10/27/2018  1:00 PM [name], MD [place]   11/08/2018  1:00 PM [facility]ION NURSE 1 [facility]   11/21/2018  1:30 PM [name], MD [facility]   12/13/2018 11:45 AM Do, Ena Dawley, MD [place]

## 2018-10-26 MED ORDER — LANCETS
5 refills | Status: DC
Start: 2018-10-26 — End: 2019-04-03

## 2018-10-27 ENCOUNTER — Telehealth
Admit: 2018-10-27 | Discharge: 2018-10-27 | Payer: PRIVATE HEALTH INSURANCE | Attending: Legal Medicine | Primary: Legal Medicine

## 2018-10-27 DIAGNOSIS — E1169 Type 2 diabetes mellitus with other specified complication: Secondary | ICD-10-CM

## 2018-10-27 NOTE — Progress Notes (Signed)
William Blalock Ellena Sr. is a 73 y.o. male who was seen by synchronous (real-time) audio-video technology on 10/27/2018.      Consent: Sherryll Burger Sr., who was seen by synchronous (real-time) audio-video technology, and/or his healthcare decision maker, is aware that this patient-initiated, Telehealth encounter on 10/27/2018 is a billable service, with coverage as determined by his insurance carrier. He is aware that he may receive a bill and has provided verbal consent to proceed: Yes.      This service was provided through (Telehealth, Virtual check in [email or web address]) patient  was at home provider Singleton Hickox Phineas Douglas was at [facility]     patient is following up after hospital admission on now April 26 and was discharged on April 28 for left hemiparesis      Patient hospital course and investigation and work-up has been reviewed    He is feeling good he recovered from slow to speech and left-sided hemiparesis and now he is walking independently without any walker, and patient think he is over 90% recovered, and does not home health for physical therapy for now.    His blood sugar has been elevated and he has been on a regular sliding scale he takes it only before meals multiple times a day a total of 15 to 20 units a day.    It was recommended in the hospital that to increase metformin    Patient renal function is within normal limit he can increase gradually to 1000 twice daily.                  Hospital Course:  41 yom with a h/o CVA, GERD, DM, DJD, cirrhosis, HTN, BPH who presented with slurred speech and L-sided weakness.  ??  Given patient's symptoms, there was concern for acute CVA and he was admitted. He was evaluated by the Stroke Neurology service and was not a candidate for tPA due to active rectal bleeding and thrombocytopenia. He underwent MRI/MRA the following morning along with echocardiogram. MRI did not show evidence of acute infarct. He had resolution of slurred speech by  the following morning and on HD#2, he had profound improvement in his L-sided weakness. IPR was initially recommended but given his improvement, HHC was felt more appropriate. In the absence of acute infarct on MRI, suspect TIA vs stroke recrudescence in the setting of possible diverticular bleed.    He reported BRBPR prior to admission but had no recurrence in the hospital. He was evaluated by GI, but in the absence of active bleeding, no procedures were done. GI suspected diverticular bleed that has now resolved.       Assessment & Plan:   Diagnoses and all orders for this visit:    1. Type 2 diabetes mellitus with other specified complication, with long-term current use of insulin (HCC)      To increase metformin to 1000 twice daily  2. Mixed hyperlipidemia    Continue statin    3. Dysphasia    Completely resolved    4. Left-sided weakness  Completely resolved  5. Hospital discharge follow-up    Patient is doing well possible TIA or stroke.  Currently on aspirin 325 mg daily          Subjective:   William Blalock Elena Sr. is a 73 y.o. male who was seen for Follow-up (after hospital discharge for stroke )      Prior to Admission medications    Medication Sig Start Date End Date Taking?  Authorizing Provider   lancets (OneTouch UltraSoft Lancets) misc USE TO TEST BLOOD 4 TIMES DAILY 10/25/18  Yes Takari Duncombe, Kathi Ludwig, MD   HumuLIN R Regular U-100 Insuln 100 unit/mL injection INJECT AS NEEDED PER SLIDING SCALE  Patient taking differently: INJECT AS NEEDED PER SLIDING SCALE Max dose 20 units daily 10/17/18  Yes Rhema Boyett S, MD   gemfibroziL (LOPID) 600 mg tablet TAKE 1 TABLET BY MOUTH TWICE DAILY 09/25/18  Yes Glenmore Karl S, MD   sucralfate (CARAFATE) 1 gram tablet TAKE 1 TABLET BY MOUTH FOUR TIMES DAILY WITH MEALS AND AT BEDTIME 09/25/18  Yes Hennessy Bartel S, MD   topiramate (TOPAMAX) 100 mg tablet TAKE 1 TABLET BY MOUTH DAILY 09/25/18  Yes Kaion Tisdale S, MD    finasteride (PROSCAR) 5 mg tablet TAKE 1 TABLET BY MOUTH DAILY 09/24/18  Yes Axavier Pressley, Kathi Ludwig, MD   lactulose (Generlac) 10 gram/15 mL solution Take 45 mL by mouth two (2) times a day for 90 days. 09/20/18 12/19/18 Yes Munir Victorian, Kathi Ludwig, MD   prochlorperazine (COMPAZINE) 10 mg tablet TAKE 1/2 TABLET BY MOUTH EVERY 6 HOURS AS NEEDED 09/19/18  Yes Chalisa Kobler, Kathi Ludwig, MD   propranolol (INDERAL) 20 mg tablet Take 1 Tab by mouth two (2) times a day for 90 days. 08/29/18 11/27/18 Yes Keiyana Stehr, Kathi Ludwig, MD   Insulin Syringe-Needle U-100 (BD INSULIN SYRINGE ULTRA-FINE) 0.3 mL 31 gauge x 5/16" syrg Use 4 times daily to inject insulin 08/23/18  Yes Morgane Joerger S, MD   LORazepam (ATIVAN) 1 mg tablet Take 1 Tab by mouth daily for 90 days. Max Daily Amount: 1 mg. 08/22/18 11/20/18 Yes Sonoma Firkus, Kathi Ludwig, MD   Blood-Glucose Meter (ONETOUCH ULTRA2 METER) misc Use to check blood sugar BID 08/22/18  Yes Gracee Ratterree S, MD   metFORMIN (GLUCOPHAGE) 500 mg tablet Take 1 Tab by mouth daily (with lunch) for 180 days. 08/14/18 02/10/19 Yes Auburn Hert, Kathi Ludwig, MD   rosuvastatin (CRESTOR) 10 mg tablet TAKE 1 TABLET BY MOUTH NIGHTLY 08/03/18  Yes Azaliyah Kennard S, MD   venlafaxine (EFFEXOR) 37.5 mg tablet Take 37.5 mg by mouth three (3) times daily.   Yes Provider, Historical   venlafaxine (EFFEXOR) 75 mg tablet Take 25 mg by mouth three (3) times daily.   Yes Provider, Historical   esomeprazole (NEXIUM) 20 mg capsule Take  by mouth daily.   Yes Provider, Historical   metoclopramide HCl (REGLAN) 10 mg tablet Take 10 mg by mouth Before breakfast, lunch, dinner and at bedtime.   Yes Provider, Historical   citalopram (CELEXA) 20 mg tablet TAKE 1 TABLET BY MOUTH DAILY FOR 2 WEEKS THEN TAKE 1 TABLET BY MOUTH EVERY OTHER DAY 04/03/18  Yes Cortlandt Capuano S, MD   pramipexole (MIRAPEX) 0.125 mg tablet TAKE 1 TABLET BY MOUTH NIGHTLY 12/30/17  Yes Tiyanna Larcom S, MD   glucose blood VI test strips (ASCENSIA AUTODISC VI, ONE TOUCH ULTRA TEST  VI) strip Use to test blood sugars four times daily with Onetouch ultra 2. 10/17/17  Yes Kaidon Kinker S, MD   oxyCODONE IR (ROXICODONE) 10 mg tab immediate release tablet Take 10 mg by mouth every six (6) hours as needed for Pain.   Yes Provider, Historical   Insulin Syringe-Needle U-100 1 mL 29 gauge x 1/2" syrg Three times per day and as needed as per sliding scale 03/31/17  Yes Dekari Bures S, MD   naloxone (NARCAN) 4 mg/actuation nasal spray Use 1 spray intranasally into 1 nostril. Use  a new Narcan nasal spray for subsequent doses and administer into alternating nostrils. May repeat every 2 to 3 minutes as needed for opioid overdose symptoms. 01/01/17  Yes [name], MD   aspirin 81 mg chewable tablet Take 81 mg by mouth daily.   Yes Provider, Historical   cholecalciferol, vitamin D3, (VITAMIN D3) 2,000 unit tab Take  by mouth.   Yes Provider, Historical   B.infantis-B.ani-B.long-B.bifi (PROBIOTIC 4X) 10-15 mg TbEC Take  by mouth.   Yes Provider, Historical   cyclobenzaprine (FLEXERIL) 10 mg tablet Take  by mouth three (3) times daily as needed for Muscle Spasm(s).   Yes Provider, Historical   Insulin Needles, Disposable, (BD ULTRA-FINE SHORT PEN NEEDLE) 31 gauge x 5/16" ndle Use 4 times daily to inject insulin. 08/22/18   [name], MD     Allergies   Allergen Reactions   ??? Doxycycline Hives   ??? Doxycycline Rash   ??? Gabapentin Other (comments)     Per pt became violent    ??? Gabapentin Other (comments)     Slurred speech   ??? Lyrica [Pregabalin] Other (comments)     Per pt stroke symptoms   ??? Lyrica [Pregabalin] Anxiety   ??? Penicillin G Rash   ??? Penicillins Hives       Patient Active Problem List   Diagnosis Code   ??? Chronic anemia D64.9   ??? Controlled type 2 diabetes mellitus without complication, without long-term current use of insulin ([place]) E11.9   ??? Essential hypertension I10   ??? Hyperlipidemia E78.5   ??? History of stroke Z86.73   ??? Anxiety F41.9   ??? Chronic pain G89.29    ??? Iron deficiency anemia D50.9   ??? 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 Other cirrhosis of liver (HCC) K74.69   ??? Depression F32.9   ??? Type II diabetes mellitus (HCC) E11.9   ??? HTN (hypertension) I10   ??? 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 Stroke (HCC) I63.9   ??? Compressed vertebrae [id number]   ??? Chronic back pain greater than 3 months duration M54.9, G89.29   ??? Hyperlipemia E78.5   ??? Anxiety F41.9   ??? 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 Iron deficiency anemia secondary to inadequate dietary iron intake D50.8   ??? 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 Thrombocytopenia ([place]) D69.6   ??? 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 Benign prostatic hyperplasia (BPH) with straining on urination N40.1, R39.16   ??? 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 Dermatitis L30.9   ??? Migraine without aura and without status migrainosus, not intractable G43.009   ??? Restless leg syndrome G25.81   ??? Peripheral vascular disease (HCC) I73.9     Patient Active Problem List    Diagnosis Date Noted   ??? Peripheral vascular disease ([place]) 02/21/2018   ??? Restless leg syndrome 12/04/2017   ??? Migraine without aura and without status migrainosus, not intractable 04/15/2017   ??? 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 Dermatitis 01/05/2017   ??? Benign prostatic hyperplasia (BPH) with straining on urination 12/31/2016   ??? 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 Myelodysplastic syndrome ([place]) 12/22/2016   ??? Type 2 diabetes with nephropathy ([place]) 10/18/2016   ??? Iron deficiency anemia secondary to inadequate dietary iron intake 09/23/2016   ??? Chronic leukopenia 09/23/2016   ???  Thrombocytopenia ([place]) 09/23/2016   ??? Moderate major depression ([place]) 09/10/2016   ??? Type II diabetes mellitus ([place]) 08/03/2016   ??? HTN (hypertension) 08/03/2016   ??? 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 Stroke ([place]) 08/03/2016    ??? Compressed vertebrae 08/03/2016   ??? Chronic back pain greater than 3 months duration 08/03/2016   ??? Hyperlipemia 08/03/2016   ??? Anxiety 08/03/2016   ??? Depression 06/30/2016   ??? Iron deficiency anemia 04/22/2016   ??? NAFLD (nonalcoholic fatty liver disease) 04/22/2016   ??? Other cirrhosis of liver ([place]) 04/22/2016   ??? Chronic anemia 03/18/2016   ??? Controlled type 2 diabetes mellitus without complication, without long-term current use of insulin ([place]) 03/18/2016   ??? Essential hypertension 03/18/2016   ??? Hyperlipidemia 03/18/2016   ??? History of stroke 03/18/2016   ??? Anxiety 03/18/2016   ??? Chronic pain 03/18/2016     Current Outpatient Medications   Medication Sig Dispense Refill   ??? lancets (OneTouch UltraSoft Lancets) misc USE TO TEST BLOOD 4 TIMES DAILY 100 Each 5   ??? HumuLIN R Regular U-100 Insuln 100 unit/mL injection INJECT AS NEEDED PER SLIDING SCALE (Patient taking differently: INJECT AS NEEDED PER SLIDING SCALE Max dose 20 units daily) 10 mL 3   ??? gemfibroziL (LOPID) 600 mg tablet TAKE 1 TABLET BY MOUTH TWICE DAILY 180 Tab 0   ??? sucralfate (CARAFATE) 1 gram tablet TAKE 1 TABLET BY MOUTH FOUR TIMES DAILY WITH MEALS AND AT BEDTIME 360 Tab 0   ??? topiramate (TOPAMAX) 100 mg tablet TAKE 1 TABLET BY MOUTH DAILY 90 Tab 0   ??? finasteride (PROSCAR) 5 mg tablet TAKE 1 TABLET BY MOUTH DAILY 90 Tab 0   ??? lactulose (Generlac) 10 gram/15 mL solution Take 45 mL by mouth two (2) times a day for 90 days. 8100 mL 1   ??? prochlorperazine (COMPAZINE) 10 mg tablet TAKE 1/2 TABLET BY MOUTH EVERY 6 HOURS AS NEEDED 30 Tab 0   ??? propranolol (INDERAL) 20 mg tablet Take 1 Tab by mouth two (2) times a day for 90 days. 180 Tab 0   ??? Insulin Syringe-Needle U-100 (BD INSULIN SYRINGE ULTRA-FINE) 0.3 mL 31 gauge x 5/16" syrg Use 4 times daily to inject insulin 400 Syringe 1   ??? LORazepam (ATIVAN) 1 mg tablet Take 1 Tab by mouth daily for 90 days. Max Daily Amount: 1 mg. 90 Tab 0    ??? Blood-Glucose Meter (ONETOUCH ULTRA2 METER) misc Use to check blood sugar BID 1 Each 0   ??? metFORMIN (GLUCOPHAGE) 500 mg tablet Take 1 Tab by mouth daily (with lunch) for 180 days. 180 Tab 0   ??? rosuvastatin (CRESTOR) 10 mg tablet TAKE 1 TABLET BY MOUTH NIGHTLY 90 Tab 0   ??? venlafaxine (EFFEXOR) 37.5 mg tablet Take 37.5 mg by mouth three (3) times daily.     ??? venlafaxine (EFFEXOR) 75 mg tablet Take 25 mg by mouth three (3) times daily.     ??? esomeprazole (NEXIUM) 20 mg capsule Take  by mouth daily.     ??? metoclopramide HCl (REGLAN) 10 mg tablet Take 10 mg by mouth Before breakfast, lunch, dinner and at bedtime.     ??? citalopram (CELEXA) 20 mg tablet TAKE 1 TABLET BY MOUTH DAILY FOR 2 WEEKS THEN TAKE 1 TABLET BY MOUTH EVERY OTHER DAY 58 Tab 0   ??? pramipexole (MIRAPEX) 0.125 mg tablet TAKE 1 TABLET BY MOUTH NIGHTLY 30 Tab 0   ??? glucose blood VI test  strips (ASCENSIA AUTODISC VI, ONE TOUCH ULTRA TEST VI) strip Use to test blood sugars four times daily with Onetouch ultra 2. 400 Strip 3   ??? oxyCODONE IR (ROXICODONE) 10 mg tab immediate release tablet Take 10 mg by mouth every six (6) hours as needed for Pain.     ??? Insulin Syringe-Needle U-100 1 mL 29 gauge x 1/2" syrg Three times per day and as needed as per sliding scale 100 Syringe 6   ??? naloxone (NARCAN) 4 mg/actuation nasal spray Use 1 spray intranasally into 1 nostril. Use a new Narcan nasal spray for subsequent doses and administer into alternating nostrils. May repeat every 2 to 3 minutes as needed for opioid overdose symptoms. 1 Each 0   ??? aspirin 81 mg chewable tablet Take 81 mg by mouth daily.     ??? cholecalciferol, vitamin D3, (VITAMIN D3) 2,000 unit tab Take  by mouth.     ??? B.infantis-B.ani-B.long-B.bifi (PROBIOTIC 4X) 10-15 mg TbEC Take  by mouth.     ??? cyclobenzaprine (FLEXERIL) 10 mg tablet Take  by mouth three (3) times daily as needed for Muscle Spasm(s).     ??? Insulin Needles, Disposable, (BD ULTRA-FINE SHORT PEN NEEDLE) 31 gauge x  5/16" ndle Use 4 times daily to inject insulin. 1 Package 11     Allergies   Allergen Reactions   ??? Doxycycline Hives   ??? Doxycycline Rash   ??? Gabapentin Other (comments)     Per pt became violent    ??? Gabapentin Other (comments)     Slurred speech   ??? Lyrica [Pregabalin] Other (comments)     Per pt stroke symptoms   ??? Lyrica [Pregabalin] Anxiety   ??? Penicillin G Rash   ??? Penicillins Hives     Past Medical History:   Diagnosis Date   ??? Anemia    ??? Anxiety    ??? Chronic pain    ??? Cirrhosis of liver ([place])    ??? Diabetes ([place])    ??? GERD (gastroesophageal reflux disease)    ??? Hyperlipemia    ??? Hypertension    ??? Hypotension    ??? Migraine    ??? Other cirrhosis of liver ([place])    ??? Stroke [facility])     had 3 strokes    ??? Thrombocytopenia ([place])      Past Surgical History:   Procedure Laterality Date   ??? HX CHOLECYSTECTOMY     ??? HX ORTHOPAEDIC      R hand sx   ??? HX OTHER SURGICAL      Hand surgery- Nail gun went through finger     Family History   Problem Relation Age of Onset   ??? Alzheimer Mother    ??? Diabetes Mother    ??? Cancer Mother         colon cancer    ??? Hypertension Father    ??? Heart Disease Father    ??? Cancer Father         prostate, lung cancer    ??? Diabetes Sister    ??? Heart Disease Sister    ??? Diabetes Brother      Social History     Tobacco Use   ??? Smoking status: Former Smoker   ??? Smokeless tobacco: Never Used   ??? Tobacco comment: quit years ago   Substance Use Topics   ??? Alcohol use: No       Review of Systems   Constitutional: Negative for chills, fever, malaise/fatigue  and weight loss.   HENT: Negative for congestion, ear discharge, ear pain, hearing loss and nosebleeds.    Eyes: Negative for blurred vision, double vision and discharge.   Respiratory: Negative for cough.    Cardiovascular: Negative for chest pain, palpitations, claudication and leg swelling.   Gastrointestinal: Negative for abdominal pain, constipation, diarrhea, nausea and vomiting.   Genitourinary: Negative for dysuria, frequency and urgency.    Musculoskeletal: Positive for back pain and myalgias.   Skin: Negative for itching and rash.   Neurological: Negative for dizziness, tingling, sensory change, speech change, focal weakness, weakness and headaches.   Psychiatric/Behavioral: Negative for depression, hallucinations, substance abuse and suicidal ideas.       Objective:   Vital Signs: (As obtained by patient/caregiver at home)  There were no vitals taken for this visit.     [INSTRUCTIONS:  "[x] " Indicates a positive item  "[] " Indicates a negative item  -- DELETE ALL ITEMS NOT EXAMINED]    Constitutional: [x]  Appears well-developed and well-nourished [x]  No apparent distress      []  Abnormal -     Mental status: [x]  Alert and awake  [x]  Oriented to person/place/time [x]  Able to follow commands    []  Abnormal -     Eyes:   EOM    [x]   Normal    []  Abnormal -   Sclera  [x]   Normal    []  Abnormal -          Discharge [x]   None visible   []  Abnormal -     HENT: [x]  Normocephalic, atraumatic  []  Abnormal -   [x]  Mouth/Throat: Mucous membranes are moist    External Ears [x]  Normal  []  Abnormal -    Neck: [x]  No visualized mass []  Abnormal -     Pulmonary/Chest: [x]  Respiratory effort normal   [x]  No visualized signs of difficulty breathing or respiratory distress        []  Abnormal -      Musculoskeletal:   [x]  Normal gait with no signs of ataxia         [x]  Normal range of motion of neck        []  Abnormal -     Neurological:        [x]  No Facial Asymmetry (Cranial nerve 7 motor function) (limited exam due to video visit)          [x]  No gaze palsy        []  Abnormal -          Skin:        [x]  No significant exanthematous lesions or discoloration noted on facial skin         []  Abnormal -            Psychiatric:       [x]  Normal Affect []  Abnormal -        [x]  No Hallucinations    Other pertinent observable physical exam findings:-        We discussed the expected course, resolution and complications of the  diagnosis(es) in detail.  Medication risks, benefits, costs, interactions, and alternatives were discussed as indicated.  I advised him to contact the office if his condition worsens, changes or fails to improve as anticipated. He expressed understanding with the diagnosis(es) and plan.       William Blalock Hoffer Sr. is a 73 y.o. male who was evaluated by a video visit encounter for concerns as above. Patient identification was  verified prior to start of the visit. A caregiver was present when appropriate. Due to this being a Scientist, physiological (During ZDGLO-75 public health emergency), evaluation of the following organ systems was limited: Vitals/Constitutional/EENT/Resp/CV/GI/GU/MS/Neuro/Skin/Heme-Lymph-Imm.  Pursuant to the emergency declaration under the [place], 1135 waiver authority and the [employer] Act, this Virtual  Visit was conducted, with patient's (and/or legal guardian's) consent, to reduce the patient's risk of exposure to COVID-19 and provide necessary medical care.     Services were provided through a video synchronous discussion virtually to substitute for in-person clinic visit.   Patient and provider were located at their individual homes.      [name], MD

## 2018-11-08 ENCOUNTER — Inpatient Hospital Stay: Admit: 2018-11-08 | Payer: MEDICARE | Primary: Legal Medicine

## 2018-11-08 ENCOUNTER — Encounter: Payer: MEDICARE | Primary: Legal Medicine

## 2018-11-08 DIAGNOSIS — D464 Refractory anemia, unspecified: Secondary | ICD-10-CM

## 2018-11-08 LAB — CBC WITH 3 PART DIFF
ABS. LYMPHOCYTES: 0.4 10*3/uL — ABNORMAL LOW (ref 1.1–5.9)
ABS. MIXED CELLS: 0.5 10*3/uL (ref 0.0–2.3)
ABS. NEUTROPHILS: 2.7 10*3/uL (ref 1.8–9.5)
HCT: 37.8 % (ref 36–48)
HGB: 12.4 g/dL (ref 12.0–16)
LYMPHOCYTES: 12 % — ABNORMAL LOW (ref 14–44)
MCH: 31.6 PG (ref 25.0–35.0)
MCHC: 32.8 g/dL (ref 31–37)
MCV: 96.2 FL (ref 78–102)
Mixed cells: 14 % (ref 0.1–17)
NEUTROPHILS: 74 % — ABNORMAL HIGH (ref 40–70)
PLATELET: 91 10*3/uL — ABNORMAL LOW (ref 140–440)
RBC: 3.93 M/uL — ABNORMAL LOW (ref 4.10–5.10)
RDW: 16.7 % — ABNORMAL HIGH (ref 11.5–14.5)
WBC: 3.6 10*3/uL — ABNORMAL LOW (ref 4.5–13.0)

## 2018-11-08 NOTE — Progress Notes (Signed)
[facility] Lab Visit:    William Taves Sr.  [birth year]  [id number]    1140:  Pt arrived ambulatory.  Labs drawn peripherally in right ac and sent for processing. Pt did not meet parameters to receive Retacrit today. Pt scheduled to return in 4 weeks as ordered.  Departed OPIC ambulatory and in no distress.    Visit Vitals  BP 119/60 (BP 1 Location: Right arm, BP Patient Position: Sitting)   Pulse 75   Temp 98.5 ??F (36.9 ??C)   Resp 18   SpO2 100%     Recent Results (from the past 12 hour(s))   CBC WITH 3 PART DIFF    Collection Time: 11/08/18 11:45 AM   Result Value Ref Range    WBC 3.6 (L) 4.5 - 13.0 K/uL    RBC 3.93 (L) 4.10 - 5.10 M/uL    HGB 12.4 12.0 - 16 g/dL    HCT 37.8 36 - 48 %    MCV 96.2 78 - 102 FL    MCH 31.6 25.0 - 35.0 PG    MCHC 32.8 31 - 37 g/dL    RDW 16.7 (H) 11.5 - 14.5 %    PLATELET 91 (L) 140 - 440 K/uL    NEUTROPHILS 74 (H) 40 - 70 %    MIXED CELLS 14 0.1 - 17 %    LYMPHOCYTES 12 (L) 14 - 44 %    ABS. NEUTROPHILS 2.7 1.8 - 9.5 K/UL    ABS. MIXED CELLS 0.5 0.0 - 2.3 K/uL    ABS. LYMPHOCYTES 0.4 (L) 1.1 - 5.9 K/UL    DF AUTOMATED

## 2018-11-10 MED ORDER — ROSUVASTATIN 10 MG TAB
10 mg | ORAL_TABLET | ORAL | 1 refills | Status: DC
Start: 2018-11-10 — End: 2019-07-17

## 2018-11-10 NOTE — Telephone Encounter (Signed)
Last seen 10/27/18     Last filled 08/03/18 qty 90 w/ 0 refills

## 2018-11-15 ENCOUNTER — Encounter

## 2018-11-16 MED ORDER — METFORMIN 500 MG TAB
500 mg | ORAL_TABLET | ORAL | 0 refills | Status: DC
Start: 2018-11-16 — End: 2018-12-07

## 2018-11-21 ENCOUNTER — Encounter: Attending: Vascular Surgery | Primary: Legal Medicine

## 2018-11-22 ENCOUNTER — Encounter: Payer: MEDICARE | Primary: Legal Medicine

## 2018-12-04 ENCOUNTER — Encounter

## 2018-12-05 NOTE — Telephone Encounter (Signed)
Need to schedule VV for refill

## 2018-12-06 ENCOUNTER — Inpatient Hospital Stay: Admit: 2018-12-06 | Payer: MEDICARE | Primary: Legal Medicine

## 2018-12-06 ENCOUNTER — Encounter: Payer: MEDICARE | Primary: Legal Medicine

## 2018-12-06 DIAGNOSIS — D464 Refractory anemia, unspecified: Secondary | ICD-10-CM

## 2018-12-06 LAB — CBC WITH 3 PART DIFF
ABS. LYMPHOCYTES: 0.4 10*3/uL — ABNORMAL LOW (ref 1.1–5.9)
ABS. MIXED CELLS: 0.4 10*3/uL (ref 0.0–2.3)
ABS. NEUTROPHILS: 2.9 10*3/uL (ref 1.8–9.5)
HCT: 36.8 % (ref 36–48)
HGB: 12 g/dL (ref 12.0–16)
LYMPHOCYTES: 12 % — ABNORMAL LOW (ref 14–44)
MCH: 31.5 PG (ref 25.0–35.0)
MCHC: 32.6 g/dL (ref 31–37)
MCV: 96.6 FL (ref 78–102)
Mixed cells: 10 % (ref 0.1–17)
NEUTROPHILS: 78 % — ABNORMAL HIGH (ref 40–70)
PLATELET: 92 10*3/uL — ABNORMAL LOW (ref 140–440)
RBC: 3.81 M/uL — ABNORMAL LOW (ref 4.10–5.10)
RDW: 14.7 % — ABNORMAL HIGH (ref 11.5–14.5)
WBC: 3.7 10*3/uL — ABNORMAL LOW (ref 4.5–13.0)

## 2018-12-06 NOTE — Progress Notes (Signed)
[facility] Progress Note    Date: December 06, 2018    Name: Zaydn Gutridge [facility] Sr.    MRN: [id number]         DOB: [birth year]      Mr. Harnish arrived [employer] at [employer].  Pt was assessed and education was provided.     Mr. Keetch vitals were reviewed and patient was observed for 5 minutes prior to treatment.   Visit Vitals  BP 107/63 (BP 1 Location: Left arm, BP Patient Position: At rest;Sitting)   Pulse 85   Temp 98.4 ??F (36.9 ??C)   Resp 18   SpO2 99%       Lab results were obtained and reviewed.  Recent Results (from the past 12 hour(s))   CBC WITH 3 PART DIFF    Collection Time: 12/06/18  1:17 PM   Result Value Ref Range    WBC 3.7 (L) 4.5 - 13.0 K/uL    RBC 3.81 (L) 4.10 - 5.10 M/uL    HGB 12.0 12.0 - 16 g/dL    HCT 36.8 36 - 48 %    MCV 96.6 78 - 102 FL    MCH 31.5 25.0 - 35.0 PG    MCHC 32.6 31 - 37 g/dL    RDW 14.7 (H) 11.5 - 14.5 %    PLATELET 92 (L) 140 - 440 K/uL    NEUTROPHILS 78 (H) 40 - 70 %    MIXED CELLS 10 0.1 - 17 %    LYMPHOCYTES 12 (L) 14 - 44 %    ABS. NEUTROPHILS 2.9 1.8 - 9.5 K/UL    ABS. MIXED CELLS 0.4 0.0 - 2.3 K/uL    ABS. LYMPHOCYTES 0.4 (L) 1.1 - 5.9 K/UL    DF AUTOMATED         Retacrit HELD per parameters.    Patient armband removed and shredded.    Mr. Shifflett was discharged from [facility] in stable condition at 1325. He is to return on 01/03/19 at 1300 for his next appointment for CBC/Retacrit Q 4 wks.        Burton Apley  December 06, 2018

## 2018-12-06 NOTE — Telephone Encounter (Signed)
Future Appointments   Date Time Provider [place]   12/06/2018  1:00 PM HBV INFUSION NURSE 1 [facility]   12/07/2018  8:15 AM [name], MD [facility] [place] SCHED   01/03/2019  1:00 PM HBV FAST TRACK NURSE [facility]   01/04/2019  2:15 PM Do, Ena Dawley, MD [place]   01/09/2019 10:30 AM [name], MD [facility] [place]

## 2018-12-07 ENCOUNTER — Telehealth: Admit: 2018-12-07 | Payer: PRIVATE HEALTH INSURANCE | Attending: Legal Medicine | Primary: Legal Medicine

## 2018-12-07 DIAGNOSIS — E118 Type 2 diabetes mellitus with unspecified complications: Secondary | ICD-10-CM

## 2018-12-07 MED ORDER — LORAZEPAM 1 MG TAB
1 mg | ORAL_TABLET | Freq: Every day | ORAL | 0 refills | Status: DC
Start: 2018-12-07 — End: 2019-03-15

## 2018-12-07 MED ORDER — METFORMIN 500 MG TAB
500 mg | ORAL_TABLET | Freq: Two times a day (BID) | ORAL | 0 refills | Status: DC
Start: 2018-12-07 — End: 2019-03-09

## 2018-12-07 NOTE — Progress Notes (Signed)
William Blalock Sorbello Sr. is a 73 y.o. male who was seen by synchronous (real-time) audio-video technology on 12/07/2018.      Consent: William Burger Sr., who was seen by synchronous (real-time) audio-video technology, and/or his healthcare decision maker, is aware that this patient-initiated, Telehealth encounter on 12/07/2018 is a billable service, with coverage as determined by his insurance carrier. He is aware that he may receive a bill and has provided verbal consent to proceed: Yes.  This service was provided through (Telehealth, Virtual check in [email or web address]) patient  was at home provider [facility] Phineas Douglas was at [place]      He is here for follow-up and medication refill      Left hernia in the inguinal area for  2 weeks   Patient said he lifted a desk and a dresser parts to put them together, and after that he felt left inguinal area bulge the size of an egg reducible there is no pain.    Patient is taking Ativan as needed and I discussed with him in the past he need to reduce Ativan intake to be once a day, and now he need to try to divide the tablet in half and see if he is able to take does have a day    Patient has been doing well he is following up with hematologist last CBC was done recently was reviewed is within normal limit he is not on any iron infusion at this time.    Medication has been reviewed with patient in details and updated    Blood sugar in  Fasting the mornings 170 to190     Before dinner 140   He is on metformin 2000 mg daily as well as sliding scale he     Hemoglobin A1c need to be checked next visit        Assessment & Plan:   Diagnoses and all orders for this visit:    1. Controlled type 2 diabetes mellitus with complication, without long-term current use of insulin (HCC)  -     metFORMIN (GLUCOPHAGE) 500 mg tablet; Take 2 Tabs by mouth two (2) times daily (with meals) for 90 days.    2. Anxiety   -     LORazepam (ATIVAN) 1 mg tablet; Take 1 Tab by mouth daily for 90 days. Max Daily Amount: 1 mg.    3. Mixed hyperlipidemia    4. Intractable migraine without aura and without status migrainosus    He is following up with neurologist    He was prescribed Reglan and Topamax    To get records from neurologist    5. Benign prostatic hyperplasia (BPH) with straining on urination    Stable on current medications    6. Restless leg syndrome    7. Other cirrhosis of liver ([place])    8. Myelodysplastic syndrome ([place])  Stable  He is following up with oncologist    9. Moderate major depression ([place])  He is doing well on Effexor 150 mg daily  He feels slightly stressed and overwhelmed in the last few weeks because that has been their son and that he is moving in with them         Subjective:   William Burger Sr. is a 73 y.o. male who was seen for Follow-up and Medication Evaluation      Prior to Admission medications    Medication Sig Start Date End Date Taking? Authorizing Provider   LORazepam (ATIVAN) 1  mg tablet Take 1 Tab by mouth daily for 90 days. Max Daily Amount: 1 mg. 12/07/18 03/07/19 Yes Myriam Brandhorst, Kathi Ludwig, MD   metFORMIN (GLUCOPHAGE) 500 mg tablet Take 2 Tabs by mouth two (2) times daily (with meals) for 90 days. 12/07/18 03/07/19 Yes Brettany Sydney, Kathi Ludwig, MD   venlafaxine-SR (Effexor XR) 150 mg capsule Take 150 mg by mouth daily.   Yes Provider, Historical   rosuvastatin (CRESTOR) 10 mg tablet TAKE 1 TABLET BY MOUTH NIGHTLY 11/10/18  Yes Valeri Sula S, MD   aspirin (ASPIRIN) 325 mg tablet Take 325 mg by mouth daily. 10/25/18  Yes Provider, Historical   lancets (OneTouch UltraSoft Lancets) misc USE TO TEST BLOOD 4 TIMES DAILY 10/25/18  Yes Kerisha Goughnour, Kathi Ludwig, MD   HumuLIN R Regular U-100 Insuln 100 unit/mL injection INJECT AS NEEDED PER SLIDING SCALE  Patient taking differently: INJECT AS NEEDED PER SLIDING SCALE Max dose 20 units daily 10/17/18  Yes Katniss Weedman S, MD    gemfibroziL (LOPID) 600 mg tablet TAKE 1 TABLET BY MOUTH TWICE DAILY 09/25/18  Yes Icess Bertoni S, MD   sucralfate (CARAFATE) 1 gram tablet TAKE 1 TABLET BY MOUTH FOUR TIMES DAILY WITH MEALS AND AT BEDTIME 09/25/18  Yes Kiyan Burmester S, MD   topiramate (TOPAMAX) 100 mg tablet TAKE 1 TABLET BY MOUTH DAILY 09/25/18  Yes Spike Desilets S, MD   finasteride (PROSCAR) 5 mg tablet TAKE 1 TABLET BY MOUTH DAILY 09/24/18  Yes Nester Bachus, Kathi Ludwig, MD   lactulose (Generlac) 10 gram/15 mL solution Take 45 mL by mouth two (2) times a day for 90 days. 09/20/18 12/19/18 Yes Makayle Krahn, Kathi Ludwig, MD   prochlorperazine (COMPAZINE) 10 mg tablet TAKE 1/2 TABLET BY MOUTH EVERY 6 HOURS AS NEEDED 09/19/18  Yes Chrisandra Wiemers, Kathi Ludwig, MD   propranolol (INDERAL) 20 mg tablet Take 1 Tab by mouth two (2) times a day for 90 days. 08/29/18 12/07/18 Yes Anaira Seay, Kathi Ludwig, MD   Insulin Syringe-Needle U-100 (BD INSULIN SYRINGE ULTRA-FINE) 0.3 mL 31 gauge x 5/16" syrg Use 4 times daily to inject insulin 08/23/18  Yes Breeze Angell S, MD   Insulin Needles, Disposable, (BD ULTRA-FINE SHORT PEN NEEDLE) 31 gauge x 5/16" ndle Use 4 times daily to inject insulin. 08/22/18  Yes [name], MD   Blood-Glucose Meter (ONETOUCH ULTRA2 METER) misc Use to check blood sugar BID 08/22/18  Yes Nonie Lochner S, MD   esomeprazole (NEXIUM) 20 mg capsule Take  by mouth daily.   Yes Provider, Historical   metoclopramide HCl (REGLAN) 10 mg tablet Take 10 mg by mouth Before breakfast, lunch, dinner and at bedtime.   Yes Provider, Historical   pramipexole (MIRAPEX) 0.125 mg tablet TAKE 1 TABLET BY MOUTH NIGHTLY 12/30/17  Yes Denene Alamillo S, MD   oxyCODONE IR (ROXICODONE) 10 mg tab immediate release tablet Take 10 mg by mouth every six (6) hours as needed for Pain.   Yes Provider, Historical   Insulin Syringe-Needle U-100 1 mL 29 gauge x 1/2" syrg Three times per day and as needed as per sliding scale 03/31/17  Yes Keaundra Stehle S, MD    naloxone (NARCAN) 4 mg/actuation nasal spray Use 1 spray intranasally into 1 nostril. Use a new Narcan nasal spray for subsequent doses and administer into alternating nostrils. May repeat every 2 to 3 minutes as needed for opioid overdose symptoms. 01/01/17  Yes [name], MD   cholecalciferol, vitamin D3, (VITAMIN D3) 2,000 unit tab Take  by mouth.  Yes Provider, Historical   cyclobenzaprine (FLEXERIL) 10 mg tablet Take  by mouth three (3) times daily as needed for Muscle Spasm(s).   Yes Provider, Historical   metFORMIN (GLUCOPHAGE) 500 mg tablet TAKE 1 TABLET BY MOUTH DAILY WITH LUNCH 11/16/18 12/07/18  [name], MD   LORazepam (ATIVAN) 1 mg tablet Take 1 Tab by mouth daily for 90 days. Max Daily Amount: 1 mg. 08/22/18 12/07/18  [name], MD   citalopram (CELEXA) 20 mg tablet TAKE 1 TABLET BY MOUTH DAILY FOR 2 WEEKS THEN TAKE 1 TABLET BY MOUTH EVERY OTHER DAY 04/03/18 12/07/18  [name], MD   glucose blood VI test strips (ASCENSIA AUTODISC VI, ONE TOUCH ULTRA TEST VI) strip Use to test blood sugars four times daily with Onetouch ultra 2. 10/17/17   [name], MD   aspirin 81 mg chewable tablet Take 81 mg by mouth daily.  12/07/18  Provider, Historical     Allergies   Allergen Reactions   ??? Doxycycline Hives   ??? Doxycycline Rash   ??? Gabapentin Other (comments)     Per pt became violent    ??? Gabapentin Other (comments)     Slurred speech   ??? Lyrica [Pregabalin] Other (comments)     Per pt stroke symptoms   ??? Lyrica [Pregabalin] Anxiety   ??? Penicillin G Rash   ??? Penicillins Hives       Patient Active Problem List   Diagnosis Code   ??? Chronic anemia D64.9   ??? Controlled type 2 diabetes mellitus without complication, without long-term current use of insulin ([place]) E11.9   ??? Essential hypertension I10   ??? Hyperlipidemia E78.5   ??? History of stroke Z86.73   ??? Anxiety F41.9   ??? Chronic pain G89.29   ??? Iron deficiency anemia D50.9   ??? NAFLD (nonalcoholic fatty liver disease) K76.0    ??? Other cirrhosis of liver (HCC) K74.69   ??? Depression F32.9   ??? Type II diabetes mellitus (HCC) E11.9   ??? HTN (hypertension) I10   ??? 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 Stroke (HCC) I63.9   ??? Compressed vertebrae [id number]   ??? Chronic back pain greater than 3 months duration M54.9, G89.29   ??? Hyperlipemia E78.5   ??? Anxiety F41.9   ??? 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 Iron deficiency anemia secondary to inadequate dietary iron intake D50.8   ??? 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 Thrombocytopenia ([place]) D69.6   ??? 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 Benign prostatic hyperplasia (BPH) with straining on urination N40.1, R39.16   ??? 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 Dermatitis L30.9   ??? Migraine without aura and without status migrainosus, not intractable G43.009   ??? Restless leg syndrome G25.81   ??? Peripheral vascular disease (HCC) I73.9     Patient Active Problem List    Diagnosis Date Noted   ??? Peripheral vascular disease ([place]) 02/21/2018   ??? Restless leg syndrome 12/04/2017   ??? Migraine without aura and without status migrainosus, not intractable 04/15/2017   ??? 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 Dermatitis 01/05/2017   ??? Benign prostatic hyperplasia (BPH) with straining on urination 12/31/2016   ??? 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 Myelodysplastic syndrome ([place]) 12/22/2016   ??? Type 2 diabetes with nephropathy ([place])  10/18/2016   ??? Iron deficiency anemia secondary to inadequate dietary iron intake 09/23/2016   ??? Chronic leukopenia 09/23/2016   ??? Thrombocytopenia ([place]) 09/23/2016   ??? Moderate major depression ([place]) 09/10/2016   ??? Type II diabetes mellitus ([place]) 08/03/2016   ??? HTN (hypertension) 08/03/2016   ??? 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 Stroke ([place]) 08/03/2016   ??? Compressed vertebrae 08/03/2016   ??? Chronic back pain greater than 3 months duration 08/03/2016    ??? Hyperlipemia 08/03/2016   ??? Anxiety 08/03/2016   ??? Depression 06/30/2016   ??? Iron deficiency anemia 04/22/2016   ??? NAFLD (nonalcoholic fatty liver disease) 04/22/2016   ??? Other cirrhosis of liver ([place]) 04/22/2016   ??? Chronic anemia 03/18/2016   ??? Controlled type 2 diabetes mellitus without complication, without long-term current use of insulin ([place]) 03/18/2016   ??? Essential hypertension 03/18/2016   ??? Hyperlipidemia 03/18/2016   ??? History of stroke 03/18/2016   ??? Anxiety 03/18/2016   ??? Chronic pain 03/18/2016     Current Outpatient Medications   Medication Sig Dispense Refill   ??? LORazepam (ATIVAN) 1 mg tablet Take 1 Tab by mouth daily for 90 days. Max Daily Amount: 1 mg. 90 Tab 0   ??? metFORMIN (GLUCOPHAGE) 500 mg tablet Take 2 Tabs by mouth two (2) times daily (with meals) for 90 days. 360 Tab 0   ??? venlafaxine-SR (Effexor XR) 150 mg capsule Take 150 mg by mouth daily.     ??? rosuvastatin (CRESTOR) 10 mg tablet TAKE 1 TABLET BY MOUTH NIGHTLY 90 Tab 1   ??? aspirin (ASPIRIN) 325 mg tablet Take 325 mg by mouth daily.     ??? lancets (OneTouch UltraSoft Lancets) misc USE TO TEST BLOOD 4 TIMES DAILY 100 Each 5   ??? HumuLIN R Regular U-100 Insuln 100 unit/mL injection INJECT AS NEEDED PER SLIDING SCALE (Patient taking differently: INJECT AS NEEDED PER SLIDING SCALE Max dose 20 units daily) 10 mL 3   ??? gemfibroziL (LOPID) 600 mg tablet TAKE 1 TABLET BY MOUTH TWICE DAILY 180 Tab 0   ??? sucralfate (CARAFATE) 1 gram tablet TAKE 1 TABLET BY MOUTH FOUR TIMES DAILY WITH MEALS AND AT BEDTIME 360 Tab 0   ??? topiramate (TOPAMAX) 100 mg tablet TAKE 1 TABLET BY MOUTH DAILY 90 Tab 0   ??? finasteride (PROSCAR) 5 mg tablet TAKE 1 TABLET BY MOUTH DAILY 90 Tab 0   ??? lactulose (Generlac) 10 gram/15 mL solution Take 45 mL by mouth two (2) times a day for 90 days. 8100 mL 1   ??? prochlorperazine (COMPAZINE) 10 mg tablet TAKE 1/2 TABLET BY MOUTH EVERY 6 HOURS AS NEEDED 30 Tab 0    ??? propranolol (INDERAL) 20 mg tablet Take 1 Tab by mouth two (2) times a day for 90 days. 180 Tab 0   ??? Insulin Syringe-Needle U-100 (BD INSULIN SYRINGE ULTRA-FINE) 0.3 mL 31 gauge x 5/16" syrg Use 4 times daily to inject insulin 400 Syringe 1   ??? Insulin Needles, Disposable, (BD ULTRA-FINE SHORT PEN NEEDLE) 31 gauge x 5/16" ndle Use 4 times daily to inject insulin. 1 Package 11   ??? Blood-Glucose Meter (ONETOUCH ULTRA2 METER) misc Use to check blood sugar BID 1 Each 0   ??? esomeprazole (NEXIUM) 20 mg capsule Take  by mouth daily.     ??? metoclopramide HCl (REGLAN) 10 mg tablet Take 10 mg by mouth Before breakfast, lunch, dinner and at bedtime.     ??? pramipexole (MIRAPEX) 0.125  mg tablet TAKE 1 TABLET BY MOUTH NIGHTLY 30 Tab 0   ??? oxyCODONE IR (ROXICODONE) 10 mg tab immediate release tablet Take 10 mg by mouth every six (6) hours as needed for Pain.     ??? Insulin Syringe-Needle U-100 1 mL 29 gauge x 1/2" syrg Three times per day and as needed as per sliding scale 100 Syringe 6   ??? naloxone (NARCAN) 4 mg/actuation nasal spray Use 1 spray intranasally into 1 nostril. Use a new Narcan nasal spray for subsequent doses and administer into alternating nostrils. May repeat every 2 to 3 minutes as needed for opioid overdose symptoms. 1 Each 0   ??? cholecalciferol, vitamin D3, (VITAMIN D3) 2,000 unit tab Take  by mouth.     ??? cyclobenzaprine (FLEXERIL) 10 mg tablet Take  by mouth three (3) times daily as needed for Muscle Spasm(s).     ??? glucose blood VI test strips (ASCENSIA AUTODISC VI, ONE TOUCH ULTRA TEST VI) strip Use to test blood sugars four times daily with Onetouch ultra 2. 400 Strip 3     Allergies   Allergen Reactions   ??? Doxycycline Hives   ??? Doxycycline Rash   ??? Gabapentin Other (comments)     Per pt became violent    ??? Gabapentin Other (comments)     Slurred speech   ??? Lyrica [Pregabalin] Other (comments)     Per pt stroke symptoms   ??? Lyrica [Pregabalin] Anxiety   ??? Penicillin G Rash   ??? Penicillins Hives      Past Medical History:   Diagnosis Date   ??? Anemia    ??? Anxiety    ??? Chronic pain    ??? Cirrhosis of liver ([place])    ??? Diabetes ([place])    ??? GERD (gastroesophageal reflux disease)    ??? Hyperlipemia    ??? Hypertension    ??? Hypotension    ??? Migraine    ??? Other cirrhosis of liver ([place])    ??? Stroke [facility])     had 3 strokes    ??? Thrombocytopenia ([place])      Past Surgical History:   Procedure Laterality Date   ??? HX CHOLECYSTECTOMY     ??? HX ORTHOPAEDIC      R hand sx   ??? HX OTHER SURGICAL      Hand surgery- Nail gun went through finger     Family History   Problem Relation Age of Onset   ??? Alzheimer Mother    ??? Diabetes Mother    ??? Cancer Mother         colon cancer    ??? Hypertension Father    ??? Heart Disease Father    ??? Cancer Father         prostate, lung cancer    ??? Diabetes Sister    ??? Heart Disease Sister    ??? Diabetes Brother      Social History     Tobacco Use   ??? Smoking status: Former Smoker   ??? Smokeless tobacco: Never Used   ??? Tobacco comment: quit years ago   Substance Use Topics   ??? Alcohol use: No       Review of Systems   Constitutional: Negative for chills, fever, malaise/fatigue and weight loss.   HENT: Negative for congestion, ear discharge, ear pain, hearing loss, nosebleeds, sinus pain and sore throat.    Eyes: Negative for blurred vision, double vision and discharge.   Respiratory: Negative for cough,  hemoptysis, sputum production, shortness of breath and wheezing.    Cardiovascular: Negative for chest pain, palpitations, claudication and leg swelling.   Gastrointestinal: Positive for nausea. Negative for abdominal pain, constipation, diarrhea, heartburn and vomiting.   Genitourinary: Negative for dysuria, frequency and urgency.   Musculoskeletal: Positive for back pain. Negative for joint pain, myalgias and neck pain.   Skin: Negative for itching and rash.   Neurological: Negative for dizziness, tingling, sensory change, speech change, focal weakness, weakness and headaches.    Psychiatric/Behavioral: Negative for depression, hallucinations, substance abuse and suicidal ideas. The patient is not nervous/anxious and does not have insomnia.        Objective:   Vital Signs: (As obtained by patient/caregiver at home)  There were no vitals taken for this visit.     [INSTRUCTIONS:  "[x] " Indicates a positive item  "[] " Indicates a negative item  -- DELETE ALL ITEMS NOT EXAMINED]    Constitutional: [x]  Appears well-developed and well-nourished [x]  No apparent distress      []  Abnormal -     Mental status: [x]  Alert and awake  [x]  Oriented to person/place/time [x]  Able to follow commands    []  Abnormal -     Eyes:   EOM    [x]   Normal    []  Abnormal -   Sclera  [x]   Normal    []  Abnormal -          Discharge [x]   None visible   []  Abnormal -     HENT: [x]  Normocephalic, atraumatic  []  Abnormal -   [x]  Mouth/Throat: Mucous membranes are moist    External Ears [x]  Normal  []  Abnormal -    Neck: [x]  No visualized mass []  Abnormal -     Pulmonary/Chest: [x]  Respiratory effort normal   [x]  No visualized signs of difficulty breathing or respiratory distress        []  Abnormal -      Musculoskeletal:   [x]  Normal gait with no signs of ataxia         [x]  Normal range of motion of neck        []  Abnormal -     Neurological:        [x]  No Facial Asymmetry (Cranial nerve 7 motor function) (limited exam due to video visit)          [x]  No gaze palsy        []  Abnormal -          Skin:        [x]  No significant exanthematous lesions or discoloration noted on facial skin         []  Abnormal -            Psychiatric:       [x]  Normal Affect []  Abnormal -        [x]  No Hallucinations    Other pertinent observable physical exam findings:-        We discussed the expected course, resolution and complications of the diagnosis(es) in detail.  Medication risks, benefits, costs, interactions, and alternatives were discussed as indicated.  I advised him to contact  the office if his condition worsens, changes or fails to improve as anticipated. He expressed understanding with the diagnosis(es) and plan.       William Blalock Bickhart Sr. is a 73 y.o. male who was evaluated by a video visit encounter for concerns as above. Patient identification was verified prior to start of the visit.  A caregiver was present when appropriate. Due to this being a Scientist, physiological (During KAJGO-11 public health emergency), evaluation of the following organ systems was limited: Vitals/Constitutional/EENT/Resp/CV/GI/GU/MS/Neuro/Skin/Heme-Lymph-Imm.  Pursuant to the emergency declaration under the [place], 1135 waiver authority and the [employer] Act, this Virtual  Visit was conducted, with patient's (and/or legal guardian's) consent, to reduce the patient's risk of exposure to COVID-19 and provide necessary medical care.     Services were provided through a video synchronous discussion virtually to substitute for in-person clinic visit.   Patient and provider were located at their individual homes.      [name], MD

## 2018-12-13 ENCOUNTER — Encounter: Attending: Internal Medicine | Primary: Legal Medicine

## 2018-12-14 ENCOUNTER — Encounter: Attending: Hematology & Oncology | Primary: Legal Medicine

## 2018-12-25 ENCOUNTER — Encounter

## 2018-12-25 MED ORDER — GEMFIBROZIL 600 MG TAB
600 mg | ORAL_TABLET | ORAL | 0 refills | Status: DC
Start: 2018-12-25 — End: 2019-03-28

## 2018-12-25 MED ORDER — FINASTERIDE 5 MG TAB
5 mg | ORAL_TABLET | ORAL | 0 refills | Status: DC
Start: 2018-12-25 — End: 2019-03-28

## 2018-12-25 MED ORDER — TOPIRAMATE 100 MG TAB
100 mg | ORAL_TABLET | ORAL | 0 refills | Status: DC
Start: 2018-12-25 — End: 2019-03-28

## 2019-01-03 ENCOUNTER — Inpatient Hospital Stay: Admit: 2019-01-03 | Payer: MEDICARE | Primary: Legal Medicine

## 2019-01-03 ENCOUNTER — Encounter: Payer: MEDICARE | Primary: Legal Medicine

## 2019-01-03 DIAGNOSIS — D464 Refractory anemia, unspecified: Secondary | ICD-10-CM

## 2019-01-03 LAB — METABOLIC PANEL, COMPREHENSIVE
A-G Ratio: 1.4 (ref 0.8–1.7)
ALT (SGPT): 29 U/L (ref 16–61)
AST (SGOT): 29 U/L (ref 10–38)
Albumin: 4.2 g/dL (ref 3.4–5.0)
Alk. phosphatase: 219 U/L — ABNORMAL HIGH (ref 45–117)
Anion gap: 8 mmol/L (ref 3.0–18)
BUN/Creatinine ratio: 17 (ref 12–20)
BUN: 16 MG/DL (ref 7.0–18)
Bilirubin, total: 0.9 MG/DL (ref 0.2–1.0)
CO2: 25 mmol/L (ref 21–32)
Calcium: 9.3 MG/DL (ref 8.5–10.1)
Chloride: 107 mmol/L (ref 100–111)
Creatinine: 0.92 MG/DL (ref 0.6–1.3)
GFR est AA: >60 mL/min/{1.73_m2} (ref >60)
GFR est non-AA: >60 mL/min/{1.73_m2} (ref >60)
Globulin: 3.1 g/dL (ref 2.0–4.0)
Glucose: 148 mg/dL — ABNORMAL HIGH (ref 74–99)
Potassium: 4.4 mmol/L (ref 3.5–5.5)
Protein, total: 7.3 g/dL (ref 6.4–8.2)
Sodium: 140 mmol/L (ref 136–145)

## 2019-01-03 LAB — IRON PROFILE
Iron % saturation: 13 % — ABNORMAL LOW (ref 20–50)
Iron: 51 ug/dL (ref 50–175)
TIBC: 381 ug/dL (ref 250–450)

## 2019-01-03 LAB — CBC WITH 3 PART DIFF
ABS. LYMPHOCYTES: 0.4 10*3/uL — ABNORMAL LOW (ref 1.1–5.9)
ABS. MIXED CELLS: 0.4 10*3/uL (ref 0.0–2.3)
ABS. NEUTROPHILS: 2.8 10*3/uL (ref 1.8–9.5)
HCT: 35.3 % — ABNORMAL LOW (ref 36–48)
HGB: 11.8 g/dL — ABNORMAL LOW (ref 12.0–16)
LYMPHOCYTES: 12 % — ABNORMAL LOW (ref 14–44)
MCH: 32.1 PG (ref 25.0–35.0)
MCHC: 33.4 g/dL (ref 31–37)
MCV: 95.9 FL (ref 78–102)
Mixed cells: 12 % (ref 0.1–17)
NEUTROPHILS: 76 % — ABNORMAL HIGH (ref 40–70)
PLATELET: 88 10*3/uL — ABNORMAL LOW (ref 140–440)
RBC: 3.68 M/uL — ABNORMAL LOW (ref 4.10–5.10)
RDW: 13.7 % (ref 11.5–14.5)
WBC: 3.6 10*3/uL — ABNORMAL LOW (ref 4.5–13.0)

## 2019-01-03 LAB — FERRITIN: Ferritin: 44 NG/ML (ref 8–388)

## 2019-01-03 NOTE — Progress Notes (Signed)
[facility] Progress Note    Date: January 03, 2019    Name: William [facility] Sr.    MRN: [id number]         DOB: 1945-10-25      [name] arrived [employer] at [employer].  Pt was assessed and education was provided.     [name] vitals were reviewed and patient was observed for 5 minutes prior to treatment.   Visit Vitals  BP 110/63   Pulse 98   Temp 98.1 ??F (36.7 ??C)   Resp 16   SpO2 100%       CBC drawn peripherally from R AC x 1 attempt. Pt states he has an appt scheduled with Dr. Lazaro Arms for tomorrow. Collected labs (CMP, ferritin, and iron profile) and sent out for processing. CBC ran in house. Gauze/coban to site. Pt tolerated well.    Lab results were obtained and reviewed.    Recent Results (from the past 12 hour(s))   CBC WITH 3 PART DIFF    Collection Time: 01/03/19  1:15 PM   Result Value Ref Range    WBC 3.6 (L) 4.5 - 13.0 K/uL    RBC 3.68 (L) 4.10 - 5.10 M/uL    HGB 11.8 (L) 12.0 - 16 g/dL    HCT 35.3 (L) 36 - 48 %    MCV 95.9 78 - 102 FL    MCH 32.1 25.0 - 35.0 PG    MCHC 33.4 31 - 37 g/dL    RDW 13.7 11.5 - 14.5 %    PLATELET 88 (L) 140 - 440 K/uL    NEUTROPHILS 76 (H) 40 - 70 %    MIXED CELLS 12 0.1 - 17 %    LYMPHOCYTES 12 (L) 14 - 44 %    ABS. NEUTROPHILS 2.8 1.8 - 9.5 K/UL    ABS. MIXED CELLS 0.4 0.0 - 2.3 K/uL    ABS. LYMPHOCYTES 0.4 (L) 1.1 - 5.9 K/UL    DF AUTOMATED         Retacrit HELD per parameters.    Patient armband removed and shredded.    [name] was discharged from [facility] in stable condition at 1325. He is to return on 01/31/19 at 1300 for his next appointment for CBC/Retacrit Q 4 wks.        Loyal Jacobson, RN  January 03, 2019

## 2019-01-04 ENCOUNTER — Encounter: Attending: Internal Medicine | Primary: Legal Medicine

## 2019-01-04 ENCOUNTER — Encounter: Attending: Family | Primary: Legal Medicine

## 2019-01-09 ENCOUNTER — Encounter: Attending: Vascular Surgery | Primary: Legal Medicine

## 2019-01-10 ENCOUNTER — Encounter

## 2019-01-10 MED ORDER — PROPRANOLOL 20 MG TAB
20 mg | ORAL_TABLET | ORAL | 0 refills | Status: DC
Start: 2019-01-10 — End: 2019-04-17

## 2019-01-10 MED ORDER — SUCRALFATE 1 GRAM TAB
1 gram | ORAL_TABLET | ORAL | 0 refills | Status: DC
Start: 2019-01-10 — End: 2019-05-28

## 2019-01-12 ENCOUNTER — Telehealth
Admit: 2019-01-12 | Discharge: 2019-01-12 | Payer: PRIVATE HEALTH INSURANCE | Attending: Legal Medicine | Primary: Legal Medicine

## 2019-01-12 ENCOUNTER — Telehealth
Admit: 2019-01-12 | Discharge: 2019-01-12 | Payer: PRIVATE HEALTH INSURANCE | Attending: Family | Primary: Legal Medicine

## 2019-01-12 ENCOUNTER — Encounter: Attending: Family | Primary: Legal Medicine

## 2019-01-12 DIAGNOSIS — R1010 Upper abdominal pain, unspecified: Secondary | ICD-10-CM

## 2019-01-12 DIAGNOSIS — D469 Myelodysplastic syndrome, unspecified: Secondary | ICD-10-CM

## 2019-01-12 NOTE — Progress Notes (Signed)
William Blalock Hurwitz Sr. is a 73 y.o. male who was seen by synchronous (real-time) audio-video technology on 01/12/2019 for Hospital Follow Up and Abdominal Pain    Patient is scheduled follow-up after visiting the emergency room on July 9 due to abdominal pain patient had epigastric pain for a few months, that is  not related to diet, he has normal appetite normal bowel movement no fever no chills.  He had CT scan and the results are shown below.    Pain has improved no change in medications.    No fever no chills no decreased appetite, his wife said he feels slightly fatigued    Patient has chronic liver cirrhosis and anemia and he is following up with hematology, he is due for blood work today to decide on his iron infusion      I will reach out to Dr. Adriana Mccallum , a call was placed to his office to discuss the case      Patient had blood work and is showing positive for campylobacter     Enteric culture   NO SALMONELLA, SHIGELLA, YERSINIA, VIBRIO, AEROMONAS, PLESIOMONAS, OR PREDOMINATING STAPHYLOCOCCUS AUREUS OR YEAST ISOLATED.  Patient currently has pain        IMPRESSION    There is cirrhosis and portal hypertension with small amount of ascites. Evaluation for liver lesions is limited on a single phase CT. Consider MRI for further evaluation on a nonemergent basis.    Splenomegaly with heterogeneous enhancement the spleen.    Diffuse colonic wall thickening of entire colon which may reflect the changes of cirrhosis however pain colitis either infectious or inflammatory is not excluded.    Esophageal varices present which are prominent to bleeding.         Assessment & Plan:   Diagnoses and all orders for this visit:    1. Pain of upper abdomen    Stable will discuss with GI Dr.Martin     2. Controlled type 2 diabetes mellitus without complication, without long-term current use of insulin ([place])    3. Myelodysplastic syndrome ([place])    4. Other cirrhosis of liver ([place])          712  Subjective:        Prior to Admission medications    Medication Sig Start Date End Date Taking? Authorizing Provider   sucralfate (CARAFATE) 1 gram tablet TAKE 1 TABLET BY MOUTH FOUR TIMES DAILY AT BEDTIME WITH MEALS 01/10/19   [name], MD   propranoloL (INDERAL) 20 mg tablet TAKE 1 TABLET BY MOUTH TWICE DAILY 01/10/19   [name], MD   finasteride (PROSCAR) 5 mg tablet TAKE 1 TABLET BY MOUTH DAILY 12/25/18   [name], MD   gemfibroziL (LOPID) 600 mg tablet TAKE 1 TABLET BY MOUTH TWICE DAILY 12/25/18   [name], MD   topiramate (TOPAMAX) 100 mg tablet TAKE 1 TABLET BY MOUTH DAILY 12/25/18   [name], MD   LORazepam (ATIVAN) 1 mg tablet Take 1 Tab by mouth daily for 90 days. Max Daily Amount: 1 mg. 12/07/18 03/07/19  [name], MD   metFORMIN (GLUCOPHAGE) 500 mg tablet Take 2 Tabs by mouth two (2) times daily (with meals) for 90 days. 12/07/18 03/07/19  [name], MD   venlafaxine-SR (Effexor XR) 150 mg capsule Take 150 mg by mouth daily.    Provider, Historical   rosuvastatin (CRESTOR) 10 mg tablet TAKE 1 TABLET BY MOUTH NIGHTLY 11/10/18  [name], MD   aspirin (ASPIRIN) 325 mg tablet Take 325 mg by mouth daily. 10/25/18   Provider, Historical   lancets (OneTouch UltraSoft Lancets) misc USE TO TEST BLOOD 4 TIMES DAILY 10/25/18   [name], MD   HumuLIN R Regular U-100 Insuln 100 unit/mL injection INJECT AS NEEDED PER SLIDING SCALE  Patient taking differently: INJECT AS NEEDED PER SLIDING SCALE Max dose 20 units daily 10/17/18   [name], MD   prochlorperazine (COMPAZINE) 10 mg tablet TAKE 1/2 TABLET BY MOUTH EVERY 6 HOURS AS NEEDED 09/19/18   [name], MD   Insulin Syringe-Needle U-100 (BD INSULIN SYRINGE ULTRA-FINE) 0.3 mL 31 gauge x 5/16" syrg Use 4 times daily to inject insulin 08/23/18   Shelitha Magley S, MD   Insulin Needles, Disposable, (BD ULTRA-FINE SHORT PEN NEEDLE) 31 gauge x  5/16" ndle Use 4 times daily to inject insulin. 08/22/18   [name], MD   Blood-Glucose Meter [facility] Vern Claude METER) misc Use to check blood sugar BID 08/22/18   [name], MD   esomeprazole (NEXIUM) 20 mg capsule Take  by mouth daily.    Provider, Historical   metoclopramide HCl (REGLAN) 10 mg tablet Take 10 mg by mouth Before breakfast, lunch, dinner and at bedtime.    Provider, Historical   pramipexole (MIRAPEX) 0.125 mg tablet TAKE 1 TABLET BY MOUTH NIGHTLY 12/30/17   Amalie Koran S, MD   glucose blood VI test strips (ASCENSIA AUTODISC VI, ONE TOUCH ULTRA TEST VI) strip Use to test blood sugars four times daily with Onetouch ultra 2. 10/17/17   [name], MD   oxyCODONE IR (ROXICODONE) 10 mg tab immediate release tablet Take 10 mg by mouth every six (6) hours as needed for Pain.    Provider, Historical   Insulin Syringe-Needle U-100 1 mL 29 gauge x 1/2" syrg Three times per day and as needed as per sliding scale 03/31/17   [name], MD   naloxone [facility]) 4 mg/actuation nasal spray Use 1 spray intranasally into 1 nostril. Use a new Narcan nasal spray for subsequent doses and administer into alternating nostrils. May repeat every 2 to 3 minutes as needed for opioid overdose symptoms. 01/01/17   [name], MD   cholecalciferol, vitamin D3, (VITAMIN D3) 2,000 unit tab Take  by mouth.    Provider, Historical   cyclobenzaprine (FLEXERIL) 10 mg tablet Take  by mouth three (3) times daily as needed for Muscle Spasm(s).    Provider, Historical     Patient Active Problem List   Diagnosis Code   ??? Chronic anemia D64.9   ??? Controlled type 2 diabetes mellitus without complication, without long-term current use of insulin ([place]) E11.9   ??? Essential hypertension I10   ??? Hyperlipidemia E78.5   ??? History of stroke Z86.73   ??? Anxiety F41.9   ??? Chronic pain G89.29   ??? Iron deficiency anemia D50.9   ??? 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 Other cirrhosis of liver (HCC) K74.69    ??? Depression F32.9   ??? Type II diabetes mellitus (HCC) E11.9   ??? HTN (hypertension) I10   ??? 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 Stroke (HCC) I63.9   ??? Compressed vertebrae [id number]   ??? Chronic back pain greater than 3 months duration M54.9, G89.29   ??? Hyperlipemia E78.5   ??? Anxiety F41.9   ??? 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 Iron deficiency  anemia secondary to inadequate dietary iron intake D50.8   ??? 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 Thrombocytopenia ([place]) D69.6   ??? 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 Benign prostatic hyperplasia (BPH) with straining on urination N40.1, R39.16   ??? 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 Dermatitis L30.9   ??? Migraine without aura and without status migrainosus, not intractable G43.009   ??? Restless leg syndrome G25.81   ??? Peripheral vascular disease (HCC) I73.9     Patient Active Problem List    Diagnosis Date Noted   ??? Peripheral vascular disease ([place]) 02/21/2018   ??? Restless leg syndrome 12/04/2017   ??? Migraine without aura and without status migrainosus, not intractable 04/15/2017   ??? 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 Dermatitis 01/05/2017   ??? Benign prostatic hyperplasia (BPH) with straining on urination 12/31/2016   ??? 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 Myelodysplastic syndrome ([place]) 12/22/2016   ??? Type 2 diabetes with nephropathy ([place]) 10/18/2016   ??? Iron deficiency anemia secondary to inadequate dietary iron intake 09/23/2016   ??? Chronic leukopenia 09/23/2016   ??? Thrombocytopenia ([place]) 09/23/2016   ??? Moderate major depression ([place]) 09/10/2016   ??? Type II diabetes mellitus ([place]) 08/03/2016   ??? HTN (hypertension) 08/03/2016   ??? 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 Stroke ([place]) 08/03/2016   ??? Compressed vertebrae 08/03/2016   ??? Chronic back pain greater than 3 months duration 08/03/2016   ??? Hyperlipemia 08/03/2016   ??? Anxiety 08/03/2016    ??? Depression 06/30/2016   ??? Iron deficiency anemia 04/22/2016   ??? NAFLD (nonalcoholic fatty liver disease) 04/22/2016   ??? Other cirrhosis of liver ([place]) 04/22/2016   ??? Chronic anemia 03/18/2016   ??? Controlled type 2 diabetes mellitus without complication, without long-term current use of insulin ([place]) 03/18/2016   ??? Essential hypertension 03/18/2016   ??? Hyperlipidemia 03/18/2016   ??? History of stroke 03/18/2016   ??? Anxiety 03/18/2016   ??? Chronic pain 03/18/2016     Current Outpatient Medications   Medication Sig Dispense Refill   ??? sucralfate (CARAFATE) 1 gram tablet TAKE 1 TABLET BY MOUTH FOUR TIMES DAILY AT BEDTIME WITH MEALS 360 Tab 0   ??? propranoloL (INDERAL) 20 mg tablet TAKE 1 TABLET BY MOUTH TWICE DAILY 180 Tab 0   ??? finasteride (PROSCAR) 5 mg tablet TAKE 1 TABLET BY MOUTH DAILY 90 Tab 0   ??? gemfibroziL (LOPID) 600 mg tablet TAKE 1 TABLET BY MOUTH TWICE DAILY 180 Tab 0   ??? topiramate (TOPAMAX) 100 mg tablet TAKE 1 TABLET BY MOUTH DAILY 90 Tab 0   ??? LORazepam (ATIVAN) 1 mg tablet Take 1 Tab by mouth daily for 90 days. Max Daily Amount: 1 mg. 90 Tab 0   ??? metFORMIN (GLUCOPHAGE) 500 mg tablet Take 2 Tabs by mouth two (2) times daily (with meals) for 90 days. 360 Tab 0   ??? venlafaxine-SR (Effexor XR) 150 mg capsule Take 150 mg by mouth daily.     ??? rosuvastatin (CRESTOR) 10 mg tablet TAKE 1 TABLET BY MOUTH NIGHTLY 90 Tab 1   ??? aspirin (ASPIRIN) 325 mg tablet Take 325 mg by mouth daily.     ??? lancets (OneTouch UltraSoft Lancets) misc USE TO TEST BLOOD 4 TIMES DAILY 100 Each 5   ??? HumuLIN R Regular U-100 Insuln 100 unit/mL injection  INJECT AS NEEDED PER SLIDING SCALE (Patient taking differently: INJECT AS NEEDED PER SLIDING SCALE Max dose 20 units daily) 10 mL 3   ??? prochlorperazine (COMPAZINE) 10 mg tablet TAKE 1/2 TABLET BY MOUTH EVERY 6 HOURS AS NEEDED 30 Tab 0   ??? Insulin Syringe-Needle U-100 (BD INSULIN SYRINGE ULTRA-FINE) 0.3 mL 31 gauge x 5/16" syrg Use 4 times daily to inject insulin 400 Syringe 1    ??? Insulin Needles, Disposable, (BD ULTRA-FINE SHORT PEN NEEDLE) 31 gauge x 5/16" ndle Use 4 times daily to inject insulin. 1 Package 11   ??? Blood-Glucose Meter (ONETOUCH ULTRA2 METER) misc Use to check blood sugar BID 1 Each 0   ??? esomeprazole (NEXIUM) 20 mg capsule Take  by mouth daily.     ??? metoclopramide HCl (REGLAN) 10 mg tablet Take 10 mg by mouth Before breakfast, lunch, dinner and at bedtime.     ??? pramipexole (MIRAPEX) 0.125 mg tablet TAKE 1 TABLET BY MOUTH NIGHTLY 30 Tab 0   ??? glucose blood VI test strips (ASCENSIA AUTODISC VI, ONE TOUCH ULTRA TEST VI) strip Use to test blood sugars four times daily with Onetouch ultra 2. 400 Strip 3   ??? oxyCODONE IR (ROXICODONE) 10 mg tab immediate release tablet Take 10 mg by mouth every six (6) hours as needed for Pain.     ??? Insulin Syringe-Needle U-100 1 mL 29 gauge x 1/2" syrg Three times per day and as needed as per sliding scale 100 Syringe 6   ??? naloxone (NARCAN) 4 mg/actuation nasal spray Use 1 spray intranasally into 1 nostril. Use a new Narcan nasal spray for subsequent doses and administer into alternating nostrils. May repeat every 2 to 3 minutes as needed for opioid overdose symptoms. 1 Each 0   ??? cholecalciferol, vitamin D3, (VITAMIN D3) 2,000 unit tab Take  by mouth.     ??? cyclobenzaprine (FLEXERIL) 10 mg tablet Take  by mouth three (3) times daily as needed for Muscle Spasm(s).       Allergies   Allergen Reactions   ??? Doxycycline Hives   ??? Doxycycline Rash   ??? Gabapentin Other (comments)     Per pt became violent    ??? Gabapentin Other (comments)     Slurred speech   ??? Lyrica [Pregabalin] Other (comments)     Per pt stroke symptoms   ??? Lyrica [Pregabalin] Anxiety   ??? Penicillin G Rash   ??? Penicillins Hives     Past Medical History:   Diagnosis Date   ??? Anemia    ??? Anxiety    ??? Chronic pain    ??? Cirrhosis of liver ([place])    ??? Diabetes ([place])    ??? GERD (gastroesophageal reflux disease)    ??? Hyperlipemia    ??? Hypertension    ??? Hypotension    ??? Migraine     ??? Other cirrhosis of liver ([place])    ??? Stroke [facility])     had 3 strokes    ??? Thrombocytopenia ([place])      Past Surgical History:   Procedure Laterality Date   ??? HX CHOLECYSTECTOMY     ??? HX ORTHOPAEDIC      R hand sx   ??? HX OTHER SURGICAL      Hand surgery- Nail gun went through finger     Family History   Problem Relation Age of Onset   ??? Alzheimer Mother    ??? Diabetes Mother    ??? Cancer Mother  colon cancer    ??? Hypertension Father    ??? Heart Disease Father    ??? Cancer Father         prostate, lung cancer    ??? Diabetes Sister    ??? Heart Disease Sister    ??? Diabetes Brother      Social History     Tobacco Use   ??? Smoking status: Former Smoker   ??? Smokeless tobacco: Never Used   ??? Tobacco comment: quit years ago   Substance Use Topics   ??? Alcohol use: No       Review of Systems   Constitutional: Positive for malaise/fatigue. Negative for chills, fever and weight loss.   HENT: Negative for congestion, ear discharge, ear pain, hearing loss and nosebleeds.    Eyes: Negative for blurred vision, double vision and discharge.   Respiratory: Negative for cough, hemoptysis, sputum production and shortness of breath.    Cardiovascular: Negative for chest pain, palpitations, claudication and leg swelling.   Gastrointestinal: Positive for abdominal pain. Negative for constipation, diarrhea, nausea and vomiting.   Genitourinary: Negative for dysuria, frequency and urgency.   Musculoskeletal: Negative for myalgias.   Skin: Negative for itching and rash.   Neurological: Negative for dizziness, tingling, sensory change, speech change, focal weakness, weakness and headaches.   Psychiatric/Behavioral: Negative for depression and suicidal ideas.       Objective:     Patient-Reported Vitals 12/07/2018   Patient-Reported Weight 130 lb    Patient-Reported Systolic  875   Patient-Reported Diastolic 62      General: alert, cooperative, no distress   Mental  status: normal mood, behavior, speech, dress, motor activity, and  thought processes, able to follow commands   HENT: NCAT   Neck: no visualized mass   Resp: no respiratory distress   Neuro: no gross deficits   Skin: no discoloration or lesions of concern on visible areas   Psychiatric: normal affect, consistent with stated mood, no evidence of hallucinations     Additional exam findings:       We discussed the expected course, resolution and complications of the diagnosis(es) in detail.  Medication risks, benefits, costs, interactions, and alternatives were discussed as indicated.  I advised him to contact the office if his condition worsens, changes or fails to improve as anticipated. He expressed understanding with the diagnosis(es) and plan.       Sherryll Burger Sr., who was evaluated through a patient-initiated, synchronous (real-time) audio-video encounter, and/or his healthcare decision maker, is aware that it is a billable service, with coverage as determined by his insurance carrier. He provided verbal consent to proceed: Yes, and patient identification was verified. It was conducted pursuant to the emergency declaration under the [place], [place] waiver authority and the [employer] Act. A caregiver was present when appropriate. Ability to conduct physical exam was limited. I was at home. The patient was at home.      [name], MD

## 2019-01-12 NOTE — Progress Notes (Signed)
William Blanchard Pillars Sr. is a 73 y.o. male, evaluated via Agricultural consultant.    Attending: Dr. Rolm Gala Do    Assessment & Plan:   1. 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 Platelet count was 88,000.  *Ferritin level was 44ng/mL. Iron Profile showed an Iron level of 51ug/dL, Iron % saturation 13%.  *BUN '16mg'$ /dL and creatinine was 0.'92mg'$ /dL  *Iron Infusion is given whenever Ferritin is <100  *Procrit 60,000 units every 2 weeks when the hemoglobin is below 10 g/dL and hematocrit is below 30%.  *Injectafer '750mg'$  IV x 2 doses will be given 1 week apart- Patient verbalized understanding.  *Will continue to monitor  *CBC, CMP, Iron profile , and ferritin will be obtained prior to next office visit    2. Chronic leukopenia  *01/03/2019: CBC showed a WBC of 3.6K/uL,  *Absolute neutrophil count which is normal at 2.8K/UL.    *Absolute lymphocyte count is low at 0.4 K/UL.    *Therapeutic intervention is not warranted    3. Thrombocytopenia  *01/03/2019: CBC showed a WBC of 3.6K/uL. Hemoglobin was 11.8g/dL with hematocrit of 35.3%. Platelet count was 88,000.  *No therapeutic intervention is warranted  *Will continue to monitor    Subjective:   [name] is a 73 y.o. male who was evaluated via Agricultural consultant. He has myelodysplastic syndrome with severe anemia which he states was diagnosed 3 years ago. He also has chronic leukopenia and thrombocytopenia. He is currently receiving Procrit 60,000 units every 2 weeks as needed for hemoglobin < 10 g/dL and hematocrit of < 30 %. He also receives iron infusion when his Ferritin level is <100ng/mL. He completed 2 doses of iron infusion in the form of Injectafer on October 11, 2018 for Ferritin level of 27ng/mL. This was tolerated well without side effects.   Today he denies fevers, chills, dizziness, rashes, lymphadenopathy, and infections. He denies weakness and fatigue. He denies shortness of breath and chest pains. He denies bleeding and bruising. He denies pain or discomfort. He does not have any concerns or complaints to report at this time.    Prior to Admission medications    Medication Sig Start Date End Date Taking? Authorizing Provider   sucralfate (CARAFATE) 1 gram tablet TAKE 1 TABLET BY MOUTH FOUR TIMES DAILY AT BEDTIME WITH MEALS 01/10/19   [name], MD   propranoloL (INDERAL) 20 mg tablet TAKE 1 TABLET BY MOUTH TWICE DAILY 01/10/19   [name], MD   finasteride (PROSCAR) 5 mg tablet TAKE 1 TABLET BY MOUTH DAILY 12/25/18   [name], MD   gemfibroziL (LOPID) 600 mg tablet TAKE 1 TABLET BY MOUTH TWICE DAILY 12/25/18   [name], MD   topiramate (TOPAMAX) 100 mg tablet TAKE 1 TABLET BY MOUTH DAILY 12/25/18   [name], MD   LORazepam (ATIVAN) 1 mg tablet Take 1 Tab by mouth daily for 90 days. Max Daily Amount: 1 mg. 12/07/18 03/07/19  [name], MD   metFORMIN (GLUCOPHAGE) 500 mg tablet Take 2 Tabs by mouth two (2) times daily (with meals) for 90 days. 12/07/18 03/07/19  [name], MD   venlafaxine-SR (Effexor XR) 150 mg capsule Take 150 mg by mouth daily.    Provider, Historical   rosuvastatin (CRESTOR) 10 mg tablet TAKE 1 TABLET BY MOUTH NIGHTLY 11/10/18   [name], MD  aspirin (ASPIRIN) 325 mg tablet Take 325 mg by mouth daily. 10/25/18   Provider, Historical   lancets (OneTouch UltraSoft Lancets) misc USE TO TEST BLOOD 4 TIMES DAILY 10/25/18   [name], MD   HumuLIN R Regular U-100 Insuln 100 unit/mL injection INJECT AS NEEDED PER SLIDING SCALE  Patient taking differently: INJECT AS NEEDED PER SLIDING SCALE Max dose 20 units daily 10/17/18   [name], MD   prochlorperazine (COMPAZINE) 10 mg tablet TAKE 1/2 TABLET BY MOUTH EVERY 6 HOURS AS NEEDED 09/19/18   [name], MD    Insulin Syringe-Needle U-100 (BD INSULIN SYRINGE ULTRA-FINE) 0.3 mL 31 gauge x 5/16" syrg Use 4 times daily to inject insulin 08/23/18   Elhassan, Amira S, MD   Insulin Needles, Disposable, (BD ULTRA-FINE SHORT PEN NEEDLE) 31 gauge x 5/16" ndle Use 4 times daily to inject insulin. 08/22/18   [name], MD   Blood-Glucose Meter [facility] Vern Claude METER) misc Use to check blood sugar BID 08/22/18   [name], MD   esomeprazole (NEXIUM) 20 mg capsule Take  by mouth daily.    Provider, Historical   metoclopramide HCl (REGLAN) 10 mg tablet Take 10 mg by mouth Before breakfast, lunch, dinner and at bedtime.    Provider, Historical   pramipexole (MIRAPEX) 0.125 mg tablet TAKE 1 TABLET BY MOUTH NIGHTLY 12/30/17   Elhassan, Amira S, MD   glucose blood VI test strips (ASCENSIA AUTODISC VI, ONE TOUCH ULTRA TEST VI) strip Use to test blood sugars four times daily with Onetouch ultra 2. 10/17/17   [name], MD   oxyCODONE IR (ROXICODONE) 10 mg tab immediate release tablet Take 10 mg by mouth every six (6) hours as needed for Pain.    Provider, Historical   Insulin Syringe-Needle U-100 1 mL 29 gauge x 1/2" syrg Three times per day and as needed as per sliding scale 03/31/17   [name], MD   naloxone [facility] 4 mg/actuation nasal spray Use 1 spray intranasally into 1 nostril. Use a new Narcan nasal spray for subsequent doses and administer into alternating nostrils. May repeat every 2 to 3 minutes as needed for opioid overdose symptoms. 01/01/17   [name], MD   cholecalciferol, vitamin D3, (VITAMIN D3) 2,000 unit tab Take  by mouth.    Provider, Historical   cyclobenzaprine (FLEXERIL) 10 mg tablet Take  by mouth three (3) times daily as needed for Muscle Spasm(s).    Provider, Historical     Lab Results   Component Value Date/Time    WBC 3.6 (L) 01/03/2019 01:15 PM    Hemoglobin, POC 11.2 (L) 07/19/2016 01:50 PM    HGB 11.8 (L) 01/03/2019 01:15 PM    Hematocrit, POC 33 (L) 07/19/2016 01:50 PM     HCT 35.3 (L) 01/03/2019 01:15 PM    PLATELET 88 (L) 01/03/2019 01:15 PM    MCV 95.9 01/03/2019 01:15 PM     Lab Results   Component Value Date/Time    Sodium 140 01/03/2019 01:15 PM    Potassium 4.4 01/03/2019 01:15 PM    Chloride 107 01/03/2019 01:15 PM    CO2 25 01/03/2019 01:15 PM    Anion gap 8 01/03/2019 01:15 PM    Glucose 148 (H) 01/03/2019 01:15 PM    BUN 16 01/03/2019 01:15 PM    Creatinine 0.92 01/03/2019 01:15 PM    BUN/Creatinine ratio 17 01/03/2019 01:15 PM    GFR est AA >60 01/03/2019 01:15  PM    GFR est non-AA >60 01/03/2019 01:15 PM    Calcium 9.3 01/03/2019 01:15 PM    Bilirubin, total 0.9 01/03/2019 01:15 PM    Alk. phosphatase 219 (H) 01/03/2019 01:15 PM    Protein, total 7.3 01/03/2019 01:15 PM    Albumin 4.2 01/03/2019 01:15 PM    Globulin 3.1 01/03/2019 01:15 PM    A-G Ratio 1.4 01/03/2019 01:15 PM    ALT (SGPT) 29 01/03/2019 01:15 PM    AST (SGOT) 29 01/03/2019 01:15 PM     Lab Results   Component Value Date/Time    Iron 51 01/03/2019 01:15 PM    TIBC 381 01/03/2019 01:15 PM    Iron % saturation 13 (L) 01/03/2019 01:15 PM    Ferritin 44 01/03/2019 01:15 PM       Patient-Reported Vitals 12/07/2018   Patient-Reported Weight 130 lb    Patient-Reported Systolic  761   Patient-Reported Diastolic [place]., who was evaluated through a patient-initiated, synchronous (real-time) audio only encounter, and/or her healthcare decision maker, is aware that it is a billable service, with coverage as determined by his insurance carrier. He provided verbal consent to proceed: Yes. He has not had a related appointment within my department in the past 7 days or scheduled within the next 24 hours.      Total Time: minutes: 11-20 minutes   Follow up with Dr. Lazaro Arms in 4 months or sooner if indicated      Hilliard Clark, [facility]  01/12/2019

## 2019-01-12 NOTE — Telephone Encounter (Signed)
LM to return call for Dr. Phineas Douglas

## 2019-01-12 NOTE — Telephone Encounter (Signed)
Please call Dr.Martin [facility] I need to talk to him regarding William Blanchard

## 2019-01-15 ENCOUNTER — Encounter

## 2019-01-15 NOTE — Telephone Encounter (Signed)
Dr. Hassell Done is schedule is out until September and they want to be seen now is there anything else she can do?

## 2019-01-15 NOTE — Telephone Encounter (Signed)
I have called Dr.Martin office but no call back   Will call then again tomorrow   William Blanchard please call them again so I can talk to him )    Meanwhile if he is eating well ,no fever no vomiting no need for intervention

## 2019-01-15 NOTE — Telephone Encounter (Signed)
Patient's wife called stating that she called the GI doctor, Dr.Martin, and they stated that they could not get him in until September. The patient's wife states that he cannot wait that long and wants to know what Dr.Elhassan recommends.

## 2019-01-16 NOTE — Telephone Encounter (Signed)
Noted   Thank you

## 2019-01-16 NOTE — Telephone Encounter (Signed)
Wife says  Thank you. He's fine right now and if he needs to go to the ER she promises to call back and let us know. As I said before his staff and including Dr. Hassell Done is sick

## 2019-01-16 NOTE — Telephone Encounter (Signed)
done

## 2019-01-18 ENCOUNTER — Ambulatory Visit
Admit: 2019-01-18 | Discharge: 2019-01-18 | Payer: PRIVATE HEALTH INSURANCE | Attending: Vascular Surgery | Primary: Legal Medicine

## 2019-01-18 DIAGNOSIS — I739 Peripheral vascular disease, unspecified: Secondary | ICD-10-CM

## 2019-01-18 NOTE — Progress Notes (Signed)
[name]    Chief Complaint   Patient presents with   ??? New Patient       History and Physical   [name] is a 73 year old gentleman referred to our office for evaluation of his atherosclerosis.  He has a chronic history of cirrhosis have been having some issues with abdominal pain.  Patient presented for a CAT scan after a flareup in this pain where is noted that he had some atherosclerotic changes aorta is since her office for evaluation.  He is a non-smoker but is diabetic.  He denies any claudication symptoms but does get some pain in his left hip that does radiate down his leg with walking.  He denies any ulceration rest pain postprandial pain or signs of distal embolization.    Past Medical History:   Diagnosis Date   ??? Anemia    ??? Anxiety    ??? Chronic pain    ??? Cirrhosis of liver ([place])    ??? Diabetes ([place])    ??? GERD (gastroesophageal reflux disease)    ??? Hyperlipemia    ??? Hypertension    ??? Hypotension    ??? Migraine    ??? Other cirrhosis of liver ([place])    ??? Stroke [facility]     had 3 strokes    ??? Thrombocytopenia [facility])      Patient Active Problem List   Diagnosis Code   ??? Chronic anemia D64.9   ??? Controlled type 2 diabetes mellitus without complication, without long-term current use of insulin ([place]) E11.9   ??? Essential hypertension I10   ??? Hyperlipidemia E78.5   ??? History of stroke Z86.73   ??? Anxiety F41.9   ??? Chronic pain G89.29   ??? Iron deficiency anemia D50.9   ??? 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 Other cirrhosis of liver (HCC) K74.69   ??? Depression F32.9   ??? Type II diabetes mellitus (HCC) E11.9   ??? HTN (hypertension) I10   ??? 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 Stroke (HCC) I63.9   ??? Compressed vertebrae [id number]   ??? Chronic back pain greater than 3 months duration M54.9, G89.29   ??? Hyperlipemia E78.5   ??? Anxiety F41.9   ??? 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 Iron deficiency anemia secondary to inadequate dietary iron intake D50.8   ??? 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 Thrombocytopenia ([place]) D69.6   ??? 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 Benign prostatic hyperplasia (BPH) with straining on urination N40.1, R39.16   ??? 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 Dermatitis L30.9   ??? Migraine without aura and without status migrainosus, not intractable G43.009   ??? Restless leg syndrome G25.81   ??? Peripheral vascular disease (HCC) I73.9     Past Surgical History:   Procedure Laterality Date   ??? HX CHOLECYSTECTOMY     ??? HX ORTHOPAEDIC      R hand sx   ??? HX OTHER SURGICAL      Hand surgery- Nail gun went through finger     Current Outpatient Medications   Medication Sig Dispense Refill   ??? sucralfate (CARAFATE) 1 gram tablet TAKE 1 TABLET BY MOUTH FOUR TIMES DAILY AT BEDTIME WITH MEALS 360 Tab 0   ??? propranoloL (INDERAL) 20 mg tablet TAKE 1 TABLET BY MOUTH TWICE DAILY 180 Tab 0   ???  finasteride (PROSCAR) 5 mg tablet TAKE 1 TABLET BY MOUTH DAILY 90 Tab 0   ??? gemfibroziL (LOPID) 600 mg tablet TAKE 1 TABLET BY MOUTH TWICE DAILY 180 Tab 0   ??? topiramate (TOPAMAX) 100 mg tablet TAKE 1 TABLET BY MOUTH DAILY 90 Tab 0   ??? LORazepam (ATIVAN) 1 mg tablet Take 1 Tab by mouth daily for 90 days. Max Daily Amount: 1 mg. 90 Tab 0   ??? metFORMIN (GLUCOPHAGE) 500 mg tablet Take 2 Tabs by mouth two (2) times daily (with meals) for 90 days. 360 Tab 0   ??? venlafaxine-SR (Effexor XR) 150 mg capsule Take 150 mg by mouth daily.     ??? rosuvastatin (CRESTOR) 10 mg tablet TAKE 1 TABLET BY MOUTH NIGHTLY 90 Tab 1   ??? aspirin (ASPIRIN) 325 mg tablet Take 325 mg by mouth daily.     ??? lancets (OneTouch UltraSoft Lancets) misc USE TO TEST BLOOD 4 TIMES DAILY 100 Each 5   ??? HumuLIN R Regular U-100 Insuln 100 unit/mL injection INJECT AS NEEDED PER SLIDING SCALE (Patient taking differently: INJECT AS NEEDED PER SLIDING SCALE Max dose 20 units daily) 10 mL 3    ??? prochlorperazine (COMPAZINE) 10 mg tablet TAKE 1/2 TABLET BY MOUTH EVERY 6 HOURS AS NEEDED 30 Tab 0   ??? Insulin Syringe-Needle U-100 (BD INSULIN SYRINGE ULTRA-FINE) 0.3 mL 31 gauge x 5/16" syrg Use 4 times daily to inject insulin 400 Syringe 1   ??? Insulin Needles, Disposable, (BD ULTRA-FINE SHORT PEN NEEDLE) 31 gauge x 5/16" ndle Use 4 times daily to inject insulin. 1 Package 11   ??? Blood-Glucose Meter (ONETOUCH ULTRA2 METER) misc Use to check blood sugar BID 1 Each 0   ??? esomeprazole (NEXIUM) 20 mg capsule Take  by mouth daily.     ??? metoclopramide HCl (REGLAN) 10 mg tablet Take 10 mg by mouth Before breakfast, lunch, dinner and at bedtime.     ??? pramipexole (MIRAPEX) 0.125 mg tablet TAKE 1 TABLET BY MOUTH NIGHTLY 30 Tab 0   ??? glucose blood VI test strips (ASCENSIA AUTODISC VI, ONE TOUCH ULTRA TEST VI) strip Use to test blood sugars four times daily with Onetouch ultra 2. 400 Strip 3   ??? oxyCODONE IR (ROXICODONE) 10 mg tab immediate release tablet Take 10 mg by mouth every six (6) hours as needed for Pain.     ??? Insulin Syringe-Needle U-100 1 mL 29 gauge x 1/2" syrg Three times per day and as needed as per sliding scale 100 Syringe 6   ??? naloxone (NARCAN) 4 mg/actuation nasal spray Use 1 spray intranasally into 1 nostril. Use a new Narcan nasal spray for subsequent doses and administer into alternating nostrils. May repeat every 2 to 3 minutes as needed for opioid overdose symptoms. 1 Each 0   ??? cholecalciferol, vitamin D3, (VITAMIN D3) 2,000 unit tab Take  by mouth.     ??? cyclobenzaprine (FLEXERIL) 10 mg tablet Take  by mouth three (3) times daily as needed for Muscle Spasm(s).       Allergies   Allergen Reactions   ??? Doxycycline Hives   ??? Doxycycline Rash   ??? Gabapentin Other (comments)     Per pt became violent    ??? Gabapentin Other (comments)     Slurred speech   ??? Lyrica [Pregabalin] Other (comments)     Per pt stroke symptoms   ??? Lyrica [Pregabalin] Anxiety   ??? Penicillin G Rash   ??? Penicillins Hives  Social History     Socioeconomic History   ??? Marital status: MARRIED     Spouse name: Not on file   ??? Number of children: Not on file   ??? Years of education: Not on file   ??? Highest education level: Not on file   Occupational History   ??? Not on file   Social Needs   ??? Financial resource strain: Not on file   ??? Food insecurity     Worry: Not on file     Inability: Not on file   ??? Transportation needs     Medical: Not on file     Non-medical: Not on file   Tobacco Use   ??? Smoking status: Former Smoker   ??? Smokeless tobacco: Never Used   ??? Tobacco comment: quit years ago   Substance and Sexual Activity   ??? Alcohol use: No   ??? Drug use: No   ??? Sexual activity: Never     Partners: Male   Lifestyle   ??? Physical activity     Days per week: Not on file     Minutes per session: Not on file   ??? Stress: Not on file   Relationships   ??? Social Product manager on phone: Not on file     Gets together: Not on file     Attends religious service: Not on file     Active member of club or organization: Not on file     Attends meetings of clubs or organizations: Not on file     Relationship status: Not on file   ??? Intimate partner violence     Fear of current or ex partner: Not on file     Emotionally abused: Not on file     Physically abused: Not on file     Forced sexual activity: Not on file   Other Topics Concern   ??? Not on file   Social History Narrative    ** Merged History Encounter **           Family History   Problem Relation Age of Onset   ??? Alzheimer Mother    ??? Diabetes Mother    ??? Cancer Mother         colon cancer    ??? Hypertension Father    ??? Heart Disease Father    ??? Cancer Father         prostate, lung cancer    ??? Diabetes Sister    ??? Heart Disease Sister    ??? Diabetes Brother        Review of Systems    Review of Systems   Constitutional: Negative for chills, diaphoresis, fever, malaise/fatigue and weight loss.   HENT: Negative for hearing loss and sore throat.     Eyes: Negative for blurred vision, photophobia and redness.   Respiratory: Negative for cough, hemoptysis, shortness of breath and wheezing.    Cardiovascular: Negative for chest pain, palpitations and orthopnea.   Gastrointestinal: Positive for abdominal pain. Negative for blood in stool, constipation, diarrhea, heartburn, nausea and vomiting.   Genitourinary: Negative for dysuria, frequency, hematuria and urgency.   Musculoskeletal: Positive for back pain. Negative for myalgias.   Skin: Negative for itching and rash.   Neurological: Negative for dizziness, speech change, focal weakness, weakness and headaches.   Endo/Heme/Allergies: Does not bruise/bleed easily.   Psychiatric/Behavioral: Negative for depression and suicidal ideas.  Physical Exam:    Visit Vitals  BP 102/60 (BP 1 Location: Left arm, BP Patient Position: Sitting)   Pulse 63   Resp 18   SpO2 100%      Physical Examination: General appearance - alert, well appearing, and in no distress  Mental status - alert, oriented to person, place, and time  Eyes - sclera anicteric, left eye normal, right eye normal  Ears - right ear normal, left ear normal  Nose - normal and patent, no erythema, discharge or polyps  Mouth - mucous membranes moist, pharynx normal without lesions  Neck - supple, no significant adenopathy, no audible bruit  Lymphatics - no palpable lymphadenopathy  Chest - clear to auscultation, no wheezes, rales or rhonchi, symmetric air entry  Heart - normal rate and regular rhythm  Abdomen -soft significant tenderness to palpation right upper quadrant with no rebounding or guarding.  No hernias noted.  No pulsatile masses noted.  Extremities - peripheral pulses normal, no pedal edema, no clubbing or cyanosis      Impression and Plan:    ICD-10-CM ICD-9-CM    1. PAD (peripheral artery disease) (HCC)  I73.9 443.9 ANKLE BRACHIAL INDEX     No orders of the defined types were placed in this encounter.     Had a long discussion with Mr. Melina Copa and his wife regards to atherosclerotic changes.  I explained that sometimes as we age especially with diabetes he may have some atherosclerosis of her arterieserWith some artery hardening calcifications but does Nestl?? translate into decreased perfusion.  As he has easily palpable pulses in his foot I do not believe he has any evidence of peripheral arterial disease that is hemodynamically significant.  We will obtain ABIs to verify this.  In regards to his abdominal pain he does not have postprandial pains I do not think he has mesenteric ischemia I would like to order him a mesenteric duplex but I think this to be more appropriate once his abdominal pain is resolved has any kind of pressure with the probe may cause him significant discomfort as he has some inflammation of his colon this time is very tender to palpation.  I will see him back in 2 weeks time to check his ABIs.        The treatment plan was reviewed with the patient in detail.  The patient voiced understanding of this plan and all questions and concerns were addressed.  The patient agrees with this plan.  We discussed the signs and symptoms that would require earlier attention or intervention.     The patient was given educational material related to his/her visit and the patient has voiced understanding of the material.     I appreciate the opportunity to participate in the care of your patient.  I will be sure to keep you informed of any subsequent changes in the treatment plan.  If you have any questions or concerns, please feel free to contact me.  [name], MD    PLEASE NOTE:  This document has been produced using voice recognition software. Unrecognized errors in transcription may be present.

## 2019-01-18 NOTE — Progress Notes (Signed)
Patient presents in office today as new patient.      3 most recent PHQ Screens 03/22/2018   Little interest or pleasure in doing things Not at all   Feeling down, depressed, irritable, or hopeless Not at all   Total Score PHQ 2 0

## 2019-01-19 ENCOUNTER — Encounter

## 2019-01-19 ENCOUNTER — Inpatient Hospital Stay: Admit: 2019-01-19 | Payer: MEDICARE | Primary: Legal Medicine

## 2019-01-19 DIAGNOSIS — D464 Refractory anemia, unspecified: Secondary | ICD-10-CM

## 2019-01-19 MED ORDER — FERRIC CARBOXYMALTOSE 50 MG IRON/ML INTRAVENOUS SOLUTION
50 iron mg/mL | Freq: Once | INTRAVENOUS | Status: AC
Start: 2019-01-19 — End: 2019-01-19
  Administered 2019-01-19: 17:00:00 via INTRAVENOUS

## 2019-01-19 MED ORDER — SALINE PERIPHERAL FLUSH PRN
INTRAMUSCULAR | Status: DC | PRN
Start: 2019-01-19 — End: 2019-01-20
  Administered 2019-01-19: 17:00:00

## 2019-01-19 MED ORDER — SODIUM CHLORIDE 0.9 % IV
INTRAVENOUS | Status: DC
Start: 2019-01-19 — End: 2019-01-20

## 2019-01-19 MED FILL — SODIUM CHLORIDE 0.9 % IV: INTRAVENOUS | Qty: 1000

## 2019-01-19 MED FILL — BD POSIFLUSH NORMAL SALINE 0.9 % INJECTION SYRINGE: INTRAMUSCULAR | Qty: 10

## 2019-01-19 MED FILL — INJECTAFER 50 MG IRON/ML INTRAVENOUS SOLUTION: 50 mg iron/mL | INTRAVENOUS | Qty: 15

## 2019-01-19 NOTE — Progress Notes (Signed)
[facility] Progress Note    Date: January 19, 2019    Name: William Blanchard [facility] Sr.    MRN: [id number]         DOB: 1946/05/20    Injectafer 1/2    William Blanchard was assessed and education was provided. Pt has had Injectafer in the past and tolerated it with no complaints.    William Blanchard vitals were reviewed and patient was observed for 5 minutes prior to treatment.   Visit Vitals  BP 114/69 (BP 1 Location: Right arm, BP Patient Position: Sitting)   Pulse 70   Temp 97 ??F (36.1 ??C)   Resp 16   SpO2 99%   IVP started in rt [facility] with 24 g catheter X 1 attempt, brisk blood return and flushes well.    Injectafer 750 mg IV given over 8-9 minutes.    William Blanchard tolerated the infusion, and had no complaints.  Patient armband removed and shredded.    William Blanchard was discharged from [facility] in stable condition at 1335. He is to return on 01/26/2019 at 1100 for his next appointment for Injectafer dose #2 of 2.    Bjorn Pippin, RN  January 19, 2019  1:55 PM

## 2019-01-26 ENCOUNTER — Inpatient Hospital Stay: Admit: 2019-01-26 | Payer: MEDICARE | Primary: Legal Medicine

## 2019-01-26 ENCOUNTER — Encounter

## 2019-01-26 DIAGNOSIS — D464 Refractory anemia, unspecified: Secondary | ICD-10-CM

## 2019-01-26 MED ORDER — FERRIC CARBOXYMALTOSE 50 MG IRON/ML INTRAVENOUS SOLUTION
50 iron mg/mL | Freq: Once | INTRAVENOUS | Status: AC
Start: 2019-01-26 — End: 2019-01-26
  Administered 2019-01-26: 18:00:00 via INTRAVENOUS

## 2019-01-26 MED ORDER — SALINE PERIPHERAL FLUSH PRN
INTRAMUSCULAR | Status: DC | PRN
Start: 2019-01-26 — End: 2019-01-27

## 2019-01-26 MED ORDER — SODIUM CHLORIDE 0.9 % IV
INTRAVENOUS | Status: DC
Start: 2019-01-26 — End: 2019-01-27

## 2019-01-26 MED FILL — SODIUM CHLORIDE 0.9 % IV: INTRAVENOUS | Qty: 1000

## 2019-01-26 MED FILL — BD POSIFLUSH NORMAL SALINE 0.9 % INJECTION SYRINGE: INTRAMUSCULAR | Qty: 10

## 2019-01-26 NOTE — Progress Notes (Signed)
[facility] Progress Note    Date: January 26, 2019    Name: William Blanchard [facility] Sr.    MRN: [id number]         DOB: [birth year]    Injectafer 2/2    William Blanchard was assessed and education was provided. Pt has had Injectafer in the past and tolerated it with no complaints.    William Blanchard vitals were reviewed and patient was observed for 5 minutes prior to treatment.   Visit Vitals  BP 108/66 (BP 1 Location: Right arm, BP Patient Position: Sitting)   Pulse 77   Temp 98.1 ??F (36.7 ??C)   Resp 16   SpO2 99%   IVP started in rt [facility] with 24 g catheter X 1 attempt, brisk blood return and flushes well.    Injectafer 750 mg IV given over 8-9 minutes.    William Blanchard tolerated the infusion, and had no complaints.  Patient armband removed and shredded.    William Blanchard was discharged from [facility] in stable condition at 1350. He is to return on 01/31/2019 at 1300 for his next appointment for Retacrit.    William Pippin, RN  January 26, 2019  1:55 PM

## 2019-01-31 ENCOUNTER — Inpatient Hospital Stay: Admit: 2019-01-31 | Payer: MEDICARE | Primary: Legal Medicine

## 2019-01-31 DIAGNOSIS — D464 Refractory anemia, unspecified: Secondary | ICD-10-CM

## 2019-01-31 LAB — CBC WITH 3 PART DIFF
ABS. LYMPHOCYTES: 0.5 10*3/uL — ABNORMAL LOW (ref 1.1–5.9)
ABS. MIXED CELLS: 0.4 10*3/uL (ref 0.0–2.3)
ABS. NEUTROPHILS: 2.9 10*3/uL (ref 1.8–9.5)
HCT: 35.8 % — ABNORMAL LOW (ref 36–48)
HGB: 11.8 g/dL — ABNORMAL LOW (ref 12.0–16)
LYMPHOCYTES: 13 % — ABNORMAL LOW (ref 14–44)
MCH: 32 PG (ref 25.0–35.0)
MCHC: 33 g/dL (ref 31–37)
MCV: 97 FL (ref 78–102)
Mixed cells: 10 % (ref 0.1–17)
NEUTROPHILS: 77 % — ABNORMAL HIGH (ref 40–70)
PLATELET: 78 10*3/uL — ABNORMAL LOW (ref 140–440)
RBC: 3.69 M/uL — ABNORMAL LOW (ref 4.10–5.10)
RDW: 15.1 % — ABNORMAL HIGH (ref 11.5–14.5)
WBC: 3.8 10*3/uL — ABNORMAL LOW (ref 4.5–13.0)

## 2019-01-31 NOTE — Progress Notes (Signed)
[facility] Lab Visit:    William Bradly Sr.  [birth year]  [id number]    1335:  Pt arrived ambulatory.  Labs drawn peripherally in right ac and sent for processing. Pt is scheduled to return in 4 weeks.  Departed OPIC ambulatory and in no distress.    Visit Vitals  BP 113/67 (BP 1 Location: Right arm, BP Patient Position: Sitting)   Pulse 77   Temp 98.7 ??F (37.1 ??C)   Resp 18   SpO2 100%     Recent Results (from the past 12 hour(s))   CBC WITH 3 PART DIFF    Collection Time: 01/31/19  1:39 PM   Result Value Ref Range    WBC 3.8 (L) 4.5 - 13.0 K/uL    RBC 3.69 (L) 4.10 - 5.10 M/uL    HGB 11.8 (L) 12.0 - 16 g/dL    HCT 35.8 (L) 36 - 48 %    MCV 97.0 78 - 102 FL    MCH 32.0 25.0 - 35.0 PG    MCHC 33.0 31 - 37 g/dL    RDW 15.1 (H) 11.5 - 14.5 %    PLATELET 78 (L) 140 - 440 K/uL    NEUTROPHILS 77 (H) 40 - 70 %    MIXED CELLS 10 0.1 - 17 %    LYMPHOCYTES 13 (L) 14 - 44 %    ABS. NEUTROPHILS 2.9 1.8 - 9.5 K/UL    ABS. MIXED CELLS 0.4 0.0 - 2.3 K/uL    ABS. LYMPHOCYTES 0.5 (L) 1.1 - 5.9 K/UL    DF AUTOMATED

## 2019-02-05 ENCOUNTER — Ambulatory Visit: Admit: 2019-02-05 | Discharge: 2019-02-05 | Payer: PRIVATE HEALTH INSURANCE | Primary: Legal Medicine

## 2019-02-05 ENCOUNTER — Encounter

## 2019-02-05 ENCOUNTER — Ambulatory Visit
Admit: 2019-02-05 | Discharge: 2019-02-05 | Payer: PRIVATE HEALTH INSURANCE | Attending: Vascular Surgery | Primary: Legal Medicine

## 2019-02-05 DIAGNOSIS — R1012 Left upper quadrant pain: Secondary | ICD-10-CM

## 2019-02-05 DIAGNOSIS — I739 Peripheral vascular disease, unspecified: Secondary | ICD-10-CM

## 2019-02-05 LAB — ANKLE BRACHIAL INDEX
Left ABI: 1.11
Left TBI: 0.73
Left anterior tibial: 140 mmHg
Left arm BP: 119 mmHg
Left posterior tibial: 129 mmHg
Left toe pressure: 92 mmHg
Right ABI: 1.09
Right TBI: 0.75
Right anterior tibial: 137 mmHg
Right arm BP: 126 mmHg
Right posterior tibial: 132 mmHg
Right toe pressure: 94 mmHg

## 2019-02-05 LAB — DUPLEX ABD VISC ART ORGANS COMPLETE
Abdominal prox aorta vel: 75.1 cm/s
Celiac PSV: 91.3 cm/s
Common Hepatic PSV: 120.8 cm/s
Dist SMA PSV: 151.2 cm/s
Mid SMA PSV: 167.4 cm/s
Prox SMA PSV: 198.5 cm/s
Splenic PSV: 88.9 cm/s

## 2019-02-05 NOTE — Progress Notes (Signed)
William Blalock Lezcano Sr.    Chief Complaint   Patient presents with   ??? Leg Pain   ??? Abdominal Pain       History and Physical    Mr. Prevette returns her office today for vascular labs and discuss his peripheral arterial disease.  He states since his last visit his abdominal pain is improved slightly.  He continues to have it at least once a day in the afternoon but states that when he takes his prescribed Ativan his pain improved somewhat.  He continues to deny any claudication symptoms and states overall he is doing well.    Past Medical History:   Diagnosis Date   ??? Anemia    ??? Anxiety    ??? Chronic pain    ??? Cirrhosis of liver ([place])    ??? Diabetes ([place])    ??? GERD (gastroesophageal reflux disease)    ??? Hyperlipemia    ??? Hypertension    ??? Hypotension    ??? Migraine    ??? Other cirrhosis of liver ([place])    ??? Stroke [facility])     had 3 strokes    ??? Thrombocytopenia [facility])      Patient Active Problem List   Diagnosis Code   ??? Chronic anemia D64.9   ??? Controlled type 2 diabetes mellitus without complication, without long-term current use of insulin ([place]) E11.9   ??? Essential hypertension I10   ??? Hyperlipidemia E78.5   ??? History of stroke Z86.73   ??? Anxiety F41.9   ??? Chronic pain G89.29   ??? Iron deficiency anemia D50.9   ??? 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 Other cirrhosis of liver (HCC) K74.69   ??? Depression F32.9   ??? Type II diabetes mellitus (HCC) E11.9   ??? HTN (hypertension) I10   ??? 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 Stroke (HCC) I63.9   ??? Compressed vertebrae [id number]   ??? Chronic back pain greater than 3 months duration M54.9, G89.29   ??? Hyperlipemia E78.5   ??? Anxiety F41.9   ??? 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 Iron deficiency anemia secondary to inadequate dietary iron intake D50.8   ??? 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 Thrombocytopenia ([place]) D69.6   ??? 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 Benign prostatic hyperplasia (BPH) with straining on urination N40.1, R39.16   ??? 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 Dermatitis L30.9   ??? Migraine without aura and without status migrainosus, not intractable G43.009   ??? Restless leg syndrome G25.81   ??? Peripheral vascular disease (HCC) I73.9     Past Surgical History:   Procedure Laterality Date   ??? HX CHOLECYSTECTOMY     ??? HX ORTHOPAEDIC      R hand sx   ??? HX OTHER SURGICAL      Hand surgery- Nail gun went through finger     Current Outpatient Medications   Medication Sig Dispense Refill   ??? sucralfate (CARAFATE) 1 gram tablet TAKE 1 TABLET BY MOUTH FOUR TIMES DAILY AT BEDTIME WITH MEALS 360 Tab 0   ??? propranoloL (INDERAL) 20 mg tablet TAKE 1 TABLET BY MOUTH TWICE DAILY 180 Tab 0   ??? finasteride (PROSCAR) 5 mg tablet TAKE 1 TABLET BY MOUTH DAILY 90 Tab 0   ??? gemfibroziL (LOPID) 600 mg tablet TAKE 1 TABLET BY  MOUTH TWICE DAILY 180 Tab 0   ??? topiramate (TOPAMAX) 100 mg tablet TAKE 1 TABLET BY MOUTH DAILY 90 Tab 0   ??? LORazepam (ATIVAN) 1 mg tablet Take 1 Tab by mouth daily for 90 days. Max Daily Amount: 1 mg. 90 Tab 0   ??? metFORMIN (GLUCOPHAGE) 500 mg tablet Take 2 Tabs by mouth two (2) times daily (with meals) for 90 days. 360 Tab 0   ??? venlafaxine-SR (Effexor XR) 150 mg capsule Take 150 mg by mouth daily.     ??? rosuvastatin (CRESTOR) 10 mg tablet TAKE 1 TABLET BY MOUTH NIGHTLY 90 Tab 1   ??? aspirin (ASPIRIN) 325 mg tablet Take 325 mg by mouth daily.     ??? lancets (OneTouch UltraSoft Lancets) misc USE TO TEST BLOOD 4 TIMES DAILY 100 Each 5   ??? HumuLIN R Regular U-100 Insuln 100 unit/mL injection INJECT AS NEEDED PER SLIDING SCALE (Patient taking differently: INJECT AS NEEDED PER SLIDING SCALE Max dose 20 units daily) 10 mL 3   ??? prochlorperazine (COMPAZINE) 10 mg tablet TAKE 1/2 TABLET BY MOUTH EVERY 6 HOURS AS NEEDED 30 Tab 0   ??? Insulin Syringe-Needle U-100 (BD INSULIN SYRINGE ULTRA-FINE) 0.3 mL 31 gauge x 5/16" syrg Use 4 times daily to inject insulin 400 Syringe 1    ??? Insulin Needles, Disposable, (BD ULTRA-FINE SHORT PEN NEEDLE) 31 gauge x 5/16" ndle Use 4 times daily to inject insulin. 1 Package 11   ??? Blood-Glucose Meter (ONETOUCH ULTRA2 METER) misc Use to check blood sugar BID 1 Each 0   ??? esomeprazole (NEXIUM) 20 mg capsule Take  by mouth daily.     ??? metoclopramide HCl (REGLAN) 10 mg tablet Take 10 mg by mouth Before breakfast, lunch, dinner and at bedtime.     ??? pramipexole (MIRAPEX) 0.125 mg tablet TAKE 1 TABLET BY MOUTH NIGHTLY 30 Tab 0   ??? glucose blood VI test strips (ASCENSIA AUTODISC VI, ONE TOUCH ULTRA TEST VI) strip Use to test blood sugars four times daily with Onetouch ultra 2. 400 Strip 3   ??? oxyCODONE IR (ROXICODONE) 10 mg tab immediate release tablet Take 10 mg by mouth every six (6) hours as needed for Pain.     ??? Insulin Syringe-Needle U-100 1 mL 29 gauge x 1/2" syrg Three times per day and as needed as per sliding scale 100 Syringe 6   ??? naloxone (NARCAN) 4 mg/actuation nasal spray Use 1 spray intranasally into 1 nostril. Use a new Narcan nasal spray for subsequent doses and administer into alternating nostrils. May repeat every 2 to 3 minutes as needed for opioid overdose symptoms. 1 Each 0   ??? cholecalciferol, vitamin D3, (VITAMIN D3) 2,000 unit tab Take  by mouth.     ??? cyclobenzaprine (FLEXERIL) 10 mg tablet Take  by mouth three (3) times daily as needed for Muscle Spasm(s).       Allergies   Allergen Reactions   ??? Doxycycline Hives   ??? Doxycycline Rash   ??? Gabapentin Other (comments)     Per pt became violent    ??? Gabapentin Other (comments)     Slurred speech   ??? Lyrica [Pregabalin] Other (comments)     Per pt stroke symptoms   ??? Lyrica [Pregabalin] Anxiety   ??? Penicillin G Rash   ??? Penicillins Hives     Social History     Socioeconomic History   ??? Marital status: MARRIED     Spouse name: Not on file   ???  Number of children: Not on file   ??? Years of education: Not on file   ??? Highest education level: Not on file   Occupational History    ??? Not on file   Social Needs   ??? Financial resource strain: Not on file   ??? Food insecurity     Worry: Not on file     Inability: Not on file   ??? Transportation needs     Medical: Not on file     Non-medical: Not on file   Tobacco Use   ??? Smoking status: Former Smoker   ??? Smokeless tobacco: Never Used   ??? Tobacco comment: quit years ago   Substance and Sexual Activity   ??? Alcohol use: No   ??? Drug use: No   ??? Sexual activity: Never     Partners: Male   Lifestyle   ??? Physical activity     Days per week: Not on file     Minutes per session: Not on file   ??? Stress: Not on file   Relationships   ??? Social Product manager on phone: Not on file     Gets together: Not on file     Attends religious service: Not on file     Active member of club or organization: Not on file     Attends meetings of clubs or organizations: Not on file     Relationship status: Not on file   ??? Intimate partner violence     Fear of current or ex partner: Not on file     Emotionally abused: Not on file     Physically abused: Not on file     Forced sexual activity: Not on file   Other Topics Concern   ??? Not on file   Social History Narrative    ** Merged History Encounter **           Family History   Problem Relation Age of Onset   ??? Alzheimer Mother    ??? Diabetes Mother    ??? Cancer Mother         colon cancer    ??? Hypertension Father    ??? Heart Disease Father    ??? Cancer Father         prostate, lung cancer    ??? Diabetes Sister    ??? Heart Disease Sister    ??? Diabetes Brother        Review of Systems    Review of Systems   Constitutional: Negative for chills, diaphoresis, fever, malaise/fatigue and weight loss.   HENT: Negative for hearing loss and sore throat.    Eyes: Negative for blurred vision, photophobia and redness.   Respiratory: Negative for cough, hemoptysis, shortness of breath and wheezing.    Cardiovascular: Negative for chest pain, palpitations and orthopnea.   Gastrointestinal: Positive for abdominal pain. Negative for blood in  stool, constipation, diarrhea, heartburn, nausea and vomiting.   Genitourinary: Negative for dysuria, frequency, hematuria and urgency.   Musculoskeletal: Negative for back pain and myalgias.   Skin: Negative for itching and rash.   Neurological: Negative for dizziness, speech change, focal weakness, weakness and headaches.   Endo/Heme/Allergies: Does not bruise/bleed easily.   Psychiatric/Behavioral: Negative for depression and suicidal ideas.            Physical Exam:    Visit Vitals  BP 112/62 (BP 1 Location: Left arm, BP Patient Position: Sitting)   Pulse Marland Kitchen)  58   Resp 14   Ht 5\' 9"  (1.753 m)   Wt 135 lb (61.2 kg)   SpO2 100%   BMI 19.94 kg/m??      Physical Examination: General appearance - alert, well appearing, and in no distress  Mental status - alert, oriented to person, place, and time  Eyes - sclera anicteric, left eye normal, right eye normal  Ears - right ear normal, left ear normal  Nose - normal and patent, no erythema, discharge or polyps  Mouth - mucous membranes moist, pharynx normal without lesions  Abdomen -soft tender to palpation epigastric area.  No rebound or guarding.  Extremities - peripheral pulses normal, no pedal edema, no clubbing or cyanosis      Impression and Plan:    ICD-10-CM ICD-9-CM    1. PAD (peripheral artery disease) (HCC)  I73.9 443.9      I reviewed Mr. Vandeusen ABI results with him.  This shows normal perfusion bilaterally with no evidence of any peripheral arterial disease.  We are also able to do a mesenteric duplex today which does not show any evidence of mesenteric artery stenosis.  I did relate to him that during duplex is noted that he does have significant organomegaly with his liver and perhaps this is was causing his pain as his tenderness is located right over his liver.  I told him that since he does not have any evidence of any peripheral arterial disease or mesenteric ischemia there is no indication for continued follow-up and he will reach out to his office if  he was to develop any vascular issues in the future.          The treatment plan was reviewed with the patient in detail.  The patient voiced understanding of this plan and all questions and concerns were addressed.  The patient agrees with this plan.  We discussed the signs and symptoms that would require earlier attention or intervention.     The patient was given educational material related to his/her visit and the patient has voiced understanding of the material.     I appreciate the opportunity to participate in the care of your patient.  I will be sure to keep you informed of any subsequent changes in the treatment plan.  If you have any questions or concerns, please feel free to contact me.  [name], MD    PLEASE NOTE:  This document has been produced using voice recognition software. Unrecognized errors in transcription may be present.

## 2019-02-05 NOTE — Progress Notes (Signed)
1. Have you been to an emergency room or urgent care clinic since your last visit?   no  Hospitalized since your last visit? If yes, where, when, and reason for visit?   no  2. Have you seen or consulted any other health care providers outside of the [facility] system since your last visit including any procedures, health maintenance items. If yes, where, when and reason for visit?   yes

## 2019-02-13 NOTE — Telephone Encounter (Signed)
I spoke with patient's wife, Dr. Hassell Done is back in the office I called them. She stated that she's going to call to schedule him an appointment. He's not open until October when I called the office.  But she wants to know what to do from you.  I can not retrieve any notes for you cause I don't have access to your patients yet through [facility] because he has been to the ER

## 2019-02-13 NOTE — Telephone Encounter (Signed)
Patient's wife called stating that the patient needs a recommendation from [facility] for a new GI doctor because the one that he is seeing now is out sick and will be out for a while.

## 2019-02-15 NOTE — Telephone Encounter (Signed)
Noted  

## 2019-02-23 ENCOUNTER — Telehealth
Admit: 2019-02-23 | Discharge: 2019-02-23 | Payer: PRIVATE HEALTH INSURANCE | Attending: Legal Medicine | Primary: Legal Medicine

## 2019-02-23 DIAGNOSIS — K625 Hemorrhage of anus and rectum: Secondary | ICD-10-CM

## 2019-02-23 NOTE — Progress Notes (Signed)
William Blalock Boyan Sr. is a 73 y.o. male who was seen by synchronous (real-time) audio-video technology on 02/23/2019 for Weight Loss and Anal Bleeding    He is bleeding per rectum for about one week now he is feeling tired and week , he bleed in his cloth and he had to put pads , no bleeding today , he will be getting colonoscopy next week     I discussed with patient and his wife that if he is having more bleeding or low blood pressure or dizziness he need to go to ER         Assessment & Plan:   Diagnoses and all orders for this visit:    1. Rectal bleeding  -     CBC WITH AUTOMATED DIFF; Future  -     HEMOGLOBIN A1C W/O EAG; Future          712  Subjective:       Prior to Admission medications    Medication Sig Start Date End Date Taking? Authorizing Provider   sucralfate (CARAFATE) 1 gram tablet TAKE 1 TABLET BY MOUTH FOUR TIMES DAILY AT BEDTIME WITH MEALS 01/10/19  Yes Markeia Harkless S, MD   propranoloL (INDERAL) 20 mg tablet TAKE 1 TABLET BY MOUTH TWICE DAILY 01/10/19  Yes Karl Erway S, MD   finasteride (PROSCAR) 5 mg tablet TAKE 1 TABLET BY MOUTH DAILY 12/25/18  Yes Jarelle Ates S, MD   gemfibroziL (LOPID) 600 mg tablet TAKE 1 TABLET BY MOUTH TWICE DAILY 12/25/18  Yes Gabrielle Mester S, MD   topiramate (TOPAMAX) 100 mg tablet TAKE 1 TABLET BY MOUTH DAILY 12/25/18  Yes Consetta Cosner S, MD   LORazepam (ATIVAN) 1 mg tablet Take 1 Tab by mouth daily for 90 days. Max Daily Amount: 1 mg. 12/07/18 03/07/19 Yes Caralyn Twining, Kathi Ludwig, MD   metFORMIN (GLUCOPHAGE) 500 mg tablet Take 2 Tabs by mouth two (2) times daily (with meals) for 90 days. 12/07/18 03/07/19 Yes Kaysie Michelini, Kathi Ludwig, MD   venlafaxine-SR (Effexor XR) 150 mg capsule Take 150 mg by mouth daily.   Yes Provider, Historical   rosuvastatin (CRESTOR) 10 mg tablet TAKE 1 TABLET BY MOUTH NIGHTLY 11/10/18  Yes Anzlee Hinesley S, MD   aspirin (ASPIRIN) 325 mg tablet Take 325 mg by mouth daily. 10/25/18  Yes Provider, Historical    lancets (OneTouch UltraSoft Lancets) misc USE TO TEST BLOOD 4 TIMES DAILY 10/25/18  Yes Sharnetta Gielow, Kathi Ludwig, MD   HumuLIN R Regular U-100 Insuln 100 unit/mL injection INJECT AS NEEDED PER SLIDING SCALE  Patient taking differently: INJECT AS NEEDED PER SLIDING SCALE Max dose 20 units daily 10/17/18  Yes Carmon Sahli S, MD   prochlorperazine (COMPAZINE) 10 mg tablet TAKE 1/2 TABLET BY MOUTH EVERY 6 HOURS AS NEEDED 09/19/18  Yes Urban Naval, Kathi Ludwig, MD   Insulin Syringe-Needle U-100 (BD INSULIN SYRINGE ULTRA-FINE) 0.3 mL 31 gauge x 5/16" syrg Use 4 times daily to inject insulin 08/23/18  Yes Levon Boettcher S, MD   Insulin Needles, Disposable, (BD ULTRA-FINE SHORT PEN NEEDLE) 31 gauge x 5/16" ndle Use 4 times daily to inject insulin. 08/22/18  Yes [name], MD   Blood-Glucose Meter (ONETOUCH ULTRA2 METER) misc Use to check blood sugar BID 08/22/18  Yes Davelle Anselmi S, MD   esomeprazole (NEXIUM) 20 mg capsule Take  by mouth daily.   Yes Provider, Historical   metoclopramide HCl (REGLAN) 10 mg tablet Take 10 mg by mouth Before breakfast, lunch, dinner and  at bedtime.   Yes Provider, Historical   pramipexole (MIRAPEX) 0.125 mg tablet TAKE 1 TABLET BY MOUTH NIGHTLY 12/30/17  Yes Janann Boeve S, MD   glucose blood VI test strips (ASCENSIA AUTODISC VI, ONE TOUCH ULTRA TEST VI) strip Use to test blood sugars four times daily with Onetouch ultra 2. 10/17/17  Yes Oday Ridings S, MD   oxyCODONE IR (ROXICODONE) 10 mg tab immediate release tablet Take 10 mg by mouth every six (6) hours as needed for Pain.   Yes Provider, Historical   Insulin Syringe-Needle U-100 1 mL 29 gauge x 1/2" syrg Three times per day and as needed as per sliding scale 03/31/17  Yes Kyri Dai S, MD   naloxone (NARCAN) 4 mg/actuation nasal spray Use 1 spray intranasally into 1 nostril. Use a new Narcan nasal spray for subsequent doses and administer into alternating nostrils. May repeat every 2 to 3 minutes as  needed for opioid overdose symptoms. 01/01/17  Yes [name], MD   cholecalciferol, vitamin D3, (VITAMIN D3) 2,000 unit tab Take  by mouth.   Yes Provider, Historical   cyclobenzaprine (FLEXERIL) 10 mg tablet Take  by mouth three (3) times daily as needed for Muscle Spasm(s).   Yes Provider, Historical     Patient Active Problem List   Diagnosis Code   ??? Chronic anemia D64.9   ??? Controlled type 2 diabetes mellitus without complication, without long-term current use of insulin ([place]) E11.9   ??? Essential hypertension I10   ??? Hyperlipidemia E78.5   ??? History of stroke Z86.73   ??? Anxiety F41.9   ??? Chronic pain G89.29   ??? Iron deficiency anemia D50.9   ??? 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 Other cirrhosis of liver (HCC) K74.69   ??? Depression F32.9   ??? Type II diabetes mellitus (HCC) E11.9   ??? HTN (hypertension) I10   ??? 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 Stroke (HCC) I63.9   ??? Compressed vertebrae [id number]   ??? Chronic back pain greater than 3 months duration M54.9, G89.29   ??? Hyperlipemia E78.5   ??? Anxiety F41.9   ??? 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 Iron deficiency anemia secondary to inadequate dietary iron intake D50.8   ??? 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 Thrombocytopenia ([place]) D69.6   ??? 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 Benign prostatic hyperplasia (BPH) with straining on urination N40.1, R39.16   ??? 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 Dermatitis L30.9   ??? Migraine without aura and without status migrainosus, not intractable G43.009   ??? Restless leg syndrome G25.81   ??? Peripheral vascular disease (HCC) I73.9     Patient Active Problem List    Diagnosis Date Noted   ??? Peripheral vascular disease ([place]) 02/21/2018   ??? Restless leg syndrome 12/04/2017   ??? Migraine without aura and without status migrainosus, not intractable 04/15/2017   ??? Cholestatic pruritus 01/05/2017    ??? Dermatitis 01/05/2017   ??? Benign prostatic hyperplasia (BPH) with straining on urination 12/31/2016   ??? 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 Myelodysplastic syndrome ([place]) 12/22/2016   ??? Type 2 diabetes with nephropathy ([place]) 10/18/2016   ??? Iron deficiency anemia secondary to inadequate dietary iron intake 09/23/2016   ??? Chronic leukopenia 09/23/2016   ??? Thrombocytopenia ([place]) 09/23/2016   ???  Moderate major depression ([place]) 09/10/2016   ??? Type II diabetes mellitus ([place]) 08/03/2016   ??? HTN (hypertension) 08/03/2016   ??? 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 Stroke ([place]) 08/03/2016   ??? Compressed vertebrae 08/03/2016   ??? Chronic back pain greater than 3 months duration 08/03/2016   ??? Hyperlipemia 08/03/2016   ??? Anxiety 08/03/2016   ??? Depression 06/30/2016   ??? Iron deficiency anemia 04/22/2016   ??? NAFLD (nonalcoholic fatty liver disease) 04/22/2016   ??? Other cirrhosis of liver ([place]) 04/22/2016   ??? Chronic anemia 03/18/2016   ??? Controlled type 2 diabetes mellitus without complication, without long-term current use of insulin ([place]) 03/18/2016   ??? Essential hypertension 03/18/2016   ??? Hyperlipidemia 03/18/2016   ??? History of stroke 03/18/2016   ??? Anxiety 03/18/2016   ??? Chronic pain 03/18/2016     Current Outpatient Medications   Medication Sig Dispense Refill   ??? sucralfate (CARAFATE) 1 gram tablet TAKE 1 TABLET BY MOUTH FOUR TIMES DAILY AT BEDTIME WITH MEALS 360 Tab 0   ??? propranoloL (INDERAL) 20 mg tablet TAKE 1 TABLET BY MOUTH TWICE DAILY 180 Tab 0   ??? finasteride (PROSCAR) 5 mg tablet TAKE 1 TABLET BY MOUTH DAILY 90 Tab 0   ??? gemfibroziL (LOPID) 600 mg tablet TAKE 1 TABLET BY MOUTH TWICE DAILY 180 Tab 0   ??? topiramate (TOPAMAX) 100 mg tablet TAKE 1 TABLET BY MOUTH DAILY 90 Tab 0   ??? LORazepam (ATIVAN) 1 mg tablet Take 1 Tab by mouth daily for 90 days. Max Daily Amount: 1 mg. 90 Tab 0   ??? metFORMIN (GLUCOPHAGE) 500 mg tablet Take 2 Tabs by mouth two (2) times  daily (with meals) for 90 days. 360 Tab 0   ??? venlafaxine-SR (Effexor XR) 150 mg capsule Take 150 mg by mouth daily.     ??? rosuvastatin (CRESTOR) 10 mg tablet TAKE 1 TABLET BY MOUTH NIGHTLY 90 Tab 1   ??? aspirin (ASPIRIN) 325 mg tablet Take 325 mg by mouth daily.     ??? lancets (OneTouch UltraSoft Lancets) misc USE TO TEST BLOOD 4 TIMES DAILY 100 Each 5   ??? HumuLIN R Regular U-100 Insuln 100 unit/mL injection INJECT AS NEEDED PER SLIDING SCALE (Patient taking differently: INJECT AS NEEDED PER SLIDING SCALE Max dose 20 units daily) 10 mL 3   ??? prochlorperazine (COMPAZINE) 10 mg tablet TAKE 1/2 TABLET BY MOUTH EVERY 6 HOURS AS NEEDED 30 Tab 0   ??? Insulin Syringe-Needle U-100 (BD INSULIN SYRINGE ULTRA-FINE) 0.3 mL 31 gauge x 5/16" syrg Use 4 times daily to inject insulin 400 Syringe 1   ??? Insulin Needles, Disposable, (BD ULTRA-FINE SHORT PEN NEEDLE) 31 gauge x 5/16" ndle Use 4 times daily to inject insulin. 1 Package 11   ??? Blood-Glucose Meter (ONETOUCH ULTRA2 METER) misc Use to check blood sugar BID 1 Each 0   ??? esomeprazole (NEXIUM) 20 mg capsule Take  by mouth daily.     ??? metoclopramide HCl (REGLAN) 10 mg tablet Take 10 mg by mouth Before breakfast, lunch, dinner and at bedtime.     ??? pramipexole (MIRAPEX) 0.125 mg tablet TAKE 1 TABLET BY MOUTH NIGHTLY 30 Tab 0   ??? glucose blood VI test strips (ASCENSIA AUTODISC VI, ONE TOUCH ULTRA TEST VI) strip Use to test blood sugars four times daily with Onetouch ultra 2. 400 Strip 3   ??? oxyCODONE IR (ROXICODONE) 10 mg tab immediate release tablet Take 10 mg by mouth every six (  6) hours as needed for Pain.     ??? Insulin Syringe-Needle U-100 1 mL 29 gauge x 1/2" syrg Three times per day and as needed as per sliding scale 100 Syringe 6   ??? naloxone (NARCAN) 4 mg/actuation nasal spray Use 1 spray intranasally into 1 nostril. Use a new Narcan nasal spray for subsequent doses and administer into alternating nostrils. May repeat every 2 to 3 minutes as  needed for opioid overdose symptoms. 1 Each 0   ??? cholecalciferol, vitamin D3, (VITAMIN D3) 2,000 unit tab Take  by mouth.     ??? cyclobenzaprine (FLEXERIL) 10 mg tablet Take  by mouth three (3) times daily as needed for Muscle Spasm(s).       Allergies   Allergen Reactions   ??? Doxycycline Hives   ??? Doxycycline Rash   ??? Gabapentin Other (comments)     Per pt became violent    ??? Gabapentin Other (comments)     Slurred speech   ??? Lyrica [Pregabalin] Other (comments)     Per pt stroke symptoms   ??? Lyrica [Pregabalin] Anxiety   ??? Penicillin G Rash   ??? Penicillins Hives     Past Medical History:   Diagnosis Date   ??? Anemia    ??? Anxiety    ??? Chronic pain    ??? Cirrhosis of liver ([place])    ??? Diabetes ([place])    ??? GERD (gastroesophageal reflux disease)    ??? Hyperlipemia    ??? Hypertension    ??? Hypotension    ??? Migraine    ??? Other cirrhosis of liver ([place])    ??? Stroke [facility])     had 3 strokes    ??? Thrombocytopenia ([place])      Past Surgical History:   Procedure Laterality Date   ??? HX CHOLECYSTECTOMY     ??? HX ORTHOPAEDIC      R hand sx   ??? HX OTHER SURGICAL      Hand surgery- Nail gun went through finger     Family History   Problem Relation Age of Onset   ??? Alzheimer Mother    ??? Diabetes Mother    ??? Cancer Mother         colon cancer    ??? Hypertension Father    ??? Heart Disease Father    ??? Cancer Father         prostate, lung cancer    ??? Diabetes Sister    ??? Heart Disease Sister    ??? Diabetes Brother      Social History     Tobacco Use   ??? Smoking status: Former Smoker   ??? Smokeless tobacco: Never Used   ??? Tobacco comment: quit years ago   Substance Use Topics   ??? Alcohol use: No       Review of Systems   Constitutional: Negative for chills, fever, malaise/fatigue and weight loss.   HENT: Negative for congestion, ear discharge, ear pain, hearing loss, nosebleeds, sinus pain and sore throat.    Eyes: Negative for blurred vision, double vision and discharge.   Respiratory: Negative for cough, hemoptysis, sputum production, shortness  of breath and wheezing.    Cardiovascular: Negative for chest pain, palpitations, claudication and leg swelling.   Gastrointestinal: Positive for abdominal pain, blood in stool and nausea. Negative for constipation, diarrhea and vomiting.   Genitourinary: Negative for dysuria, frequency and urgency.   Musculoskeletal: Positive for back pain. Negative for myalgias and neck pain.  Skin: Negative for itching and rash.   Neurological: Negative for dizziness, tingling, sensory change, speech change, focal weakness, weakness and headaches.   Psychiatric/Behavioral: Negative for depression and suicidal ideas. The patient is nervous/anxious.        Objective:     Patient-Reported Vitals 02/23/2019   Patient-Reported Weight 125 lb   Patient-Reported Pulse 79   Patient-Reported Systolic  [id number]   Patient-Reported Diastolic 62      General: alert, cooperative, no distress   Mental  status: normal mood, behavior, speech, dress, motor activity, and thought processes, able to follow commands   HENT: NCAT   Neck: no visualized mass   Resp: no respiratory distress   Neuro: no gross deficits   Skin: no discoloration or lesions of concern on visible areas   Psychiatric: normal affect, consistent with stated mood, no evidence of hallucinations     Additional exam findings:       We discussed the expected course, resolution and complications of the diagnosis(es) in detail.  Medication risks, benefits, costs, interactions, and alternatives were discussed as indicated.  I advised him to contact the office if his condition worsens, changes or fails to improve as anticipated. He expressed understanding with the diagnosis(es) and plan.       William Burger Sr., who was evaluated through a patient-initiated, synchronous (real-time) audio-video encounter, and/or his healthcare decision maker, is aware that it is a billable service, with coverage as determined by his insurance carrier. He provided verbal consent to  proceed: Yes, and patient identification was verified. It was conducted pursuant to the emergency declaration under the [place], [place] waiver authority and the [employer] Act. A caregiver was present when appropriate. Ability to conduct physical exam was limited. I was in the office. The patient was at home.      [name], MD

## 2019-02-25 NOTE — Telephone Encounter (Signed)
Please call patient if he has been stable over weekend ? And to get lab work done to see his HB

## 2019-02-26 NOTE — Telephone Encounter (Signed)
LM to return call to follow up to see how he's doing and schedule labs

## 2019-02-28 ENCOUNTER — Inpatient Hospital Stay: Payer: MEDICARE | Primary: Legal Medicine

## 2019-02-28 NOTE — Telephone Encounter (Signed)
LM to return call x2

## 2019-03-01 NOTE — Telephone Encounter (Signed)
Wife stated he is doing well.  And he has seen, Dr. Hassell Done

## 2019-03-07 ENCOUNTER — Encounter

## 2019-03-09 NOTE — Telephone Encounter (Signed)
Need visit to refill lorazepam

## 2019-03-10 MED ORDER — METFORMIN 500 MG TAB
500 mg | ORAL_TABLET | ORAL | 0 refills | Status: DC
Start: 2019-03-10 — End: 2019-07-17

## 2019-03-12 NOTE — Telephone Encounter (Signed)
LM to schedule office visit for Lorazepam refill

## 2019-03-15 ENCOUNTER — Telehealth
Admit: 2019-03-15 | Discharge: 2019-03-15 | Payer: PRIVATE HEALTH INSURANCE | Attending: Legal Medicine | Primary: Legal Medicine

## 2019-03-15 DIAGNOSIS — G43019 Migraine without aura, intractable, without status migrainosus: Secondary | ICD-10-CM

## 2019-03-15 MED ORDER — LORAZEPAM 1 MG TAB
1 mg | ORAL_TABLET | Freq: Every day | ORAL | 0 refills | Status: DC
Start: 2019-03-15 — End: 2019-07-17

## 2019-03-15 MED ORDER — PROCHLORPERAZINE MALEATE 10 MG TAB
10 mg | ORAL_TABLET | Freq: Four times a day (QID) | ORAL | 1 refills | Status: DC | PRN
Start: 2019-03-15 — End: 2019-12-19

## 2019-03-15 NOTE — Progress Notes (Signed)
William Blalock Jehle Sr. is a 73 y.o. male who was seen by synchronous (real-time) audio-video technology on 03/15/2019 for Medication Refill    He is here for medication refill, he had an endoscopy and ligation of stomach and esophageal varices  By dr.Martin Abagail Kitchens  Notes has been reviewed below are a copy of some of the notes    Patient has been having a soft diet due to swallowing pain  And he will advance to solid gradually otherwise patient has been stable no  Rectal bleeding    Findings:  Grade II varices were found in the middle third of the esophagus and in the lower third of the esophagus. Four bands were successfully placed with incomplete eradication of varices. There was no bleeding during and at the end of the procedure. White nummular lesions were noted in the entire esophagus. Biopsies were taken with a cold forceps for histology. Mild inflammation was found in the gastric antrum. Biopsies were taken with a cold forceps for histology. The examined duodenum was normal. Biopsies were taken with a cold forceps for histology.    Post-Operative Diagnosis:  - Grade II esophageal varices. Incompletely eradicated. Banded.  - White nummular lesions in esophageal mucosa. Biopsied.  - Gastritis. Biopsied.  - Normal examined duodenum. Biopsied.      Assessment & Plan:   Diagnoses and all orders for this visit:    1. Intractable migraine without aura and without status migrainosus  -     prochlorperazine (COMPAZINE) 10 mg tablet; Take 1 Tab by mouth every six (6) hours as needed for Nausea or Vomiting.    2. Nausea  -     prochlorperazine (COMPAZINE) 10 mg tablet; Take 1 Tab by mouth every six (6) hours as needed for Nausea or Vomiting.    3. Anxiety  -     LORazepam (ATIVAN) 1 mg tablet; Take 1 Tab by mouth daily for 90 days. Max Daily Amount: 1 mg.    4. Controlled type 2 diabetes mellitus without complication, without long-term current use of insulin ([place]) continue same medications     5. Myelodysplastic syndrome (HCC)\\patient is following up for iron infusion        6. Other cirrhosis of liver ([place])    7. Chronic back pain greater than 3 months duration    8. Migraine without aura and without status migrainosus, not intractable  Stable on current medications    9. Restless leg syndrome    Stable on current medication  10. 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      712  Subjective:       Prior to Admission medications    Medication Sig Start Date End Date Taking? Authorizing Provider   prochlorperazine (COMPAZINE) 10 mg tablet Take 1 Tab by mouth every six (6) hours as needed for Nausea or Vomiting. 03/15/19  Yes Josua Ferrebee S, MD   LORazepam (ATIVAN) 1 mg tablet Take 1 Tab by mouth daily for 90 days. Max Daily Amount: 1 mg. 03/15/19 06/13/19 Yes Brown Dunlap S, MD   metFORMIN (GLUCOPHAGE) 500 mg tablet TAKE 2 TABLETS BY MOUTH TWICE DAILY WITH MEALS 03/09/19  Yes Fenris Cauble S, MD   sucralfate (CARAFATE) 1 gram tablet TAKE 1 TABLET BY MOUTH FOUR TIMES DAILY AT BEDTIME WITH MEALS 01/10/19  Yes Evelynn Hench S, MD   propranoloL (INDERAL) 20 mg tablet TAKE 1 TABLET BY MOUTH TWICE DAILY 01/10/19  Yes Phineas Douglas, Jeyda Siebel  S, MD   finasteride (PROSCAR) 5 mg tablet TAKE 1 TABLET BY MOUTH DAILY 12/25/18  Yes Jasmene Goswami S, MD   gemfibroziL (LOPID) 600 mg tablet TAKE 1 TABLET BY MOUTH TWICE DAILY 12/25/18  Yes Mayeli Bornhorst S, MD   topiramate (TOPAMAX) 100 mg tablet TAKE 1 TABLET BY MOUTH DAILY 12/25/18  Yes Lolitha Tortora S, MD   venlafaxine-SR (Effexor XR) 150 mg capsule Take 150 mg by mouth daily.   Yes Provider, Historical   rosuvastatin (CRESTOR) 10 mg tablet TAKE 1 TABLET BY MOUTH NIGHTLY 11/10/18  Yes Omayra Tulloch S, MD   aspirin (ASPIRIN) 325 mg tablet Take 325 mg by mouth daily. 10/25/18  Yes Provider, Historical   lancets (OneTouch UltraSoft Lancets) misc USE TO TEST BLOOD 4 TIMES DAILY 10/25/18  Yes Dmoni Fortson, Kathi Ludwig, MD    HumuLIN R Regular U-100 Insuln 100 unit/mL injection INJECT AS NEEDED PER SLIDING SCALE  Patient taking differently: INJECT AS NEEDED PER SLIDING SCALE Max dose 20 units daily 10/17/18  Yes Shonice Wrisley S, MD   Insulin Syringe-Needle U-100 (BD INSULIN SYRINGE ULTRA-FINE) 0.3 mL 31 gauge x 5/16" syrg Use 4 times daily to inject insulin 08/23/18  Yes Kaylena Pacifico S, MD   Insulin Needles, Disposable, (BD ULTRA-FINE SHORT PEN NEEDLE) 31 gauge x 5/16" ndle Use 4 times daily to inject insulin. 08/22/18  Yes [name], MD   Blood-Glucose Meter (ONETOUCH ULTRA2 METER) misc Use to check blood sugar BID 08/22/18  Yes Roniel Halloran S, MD   esomeprazole (NEXIUM) 20 mg capsule Take  by mouth daily.   Yes Provider, Historical   metoclopramide HCl (REGLAN) 10 mg tablet Take 10 mg by mouth Before breakfast, lunch, dinner and at bedtime.   Yes Provider, Historical   pramipexole (MIRAPEX) 0.125 mg tablet TAKE 1 TABLET BY MOUTH NIGHTLY 12/30/17  Yes Kriti Katayama S, MD   glucose blood VI test strips (ASCENSIA AUTODISC VI, ONE TOUCH ULTRA TEST VI) strip Use to test blood sugars four times daily with Onetouch ultra 2. 10/17/17  Yes Jaena Brocato S, MD   oxyCODONE IR (ROXICODONE) 10 mg tab immediate release tablet Take 10 mg by mouth every six (6) hours as needed for Pain.   Yes Provider, Historical   Insulin Syringe-Needle U-100 1 mL 29 gauge x 1/2" syrg Three times per day and as needed as per sliding scale 03/31/17  Yes Markita Stcharles S, MD   naloxone (NARCAN) 4 mg/actuation nasal spray Use 1 spray intranasally into 1 nostril. Use a new Narcan nasal spray for subsequent doses and administer into alternating nostrils. May repeat every 2 to 3 minutes as needed for opioid overdose symptoms. 01/01/17  Yes [name], MD   cholecalciferol, vitamin D3, (VITAMIN D3) 2,000 unit tab Take  by mouth.   Yes Provider, Historical   cyclobenzaprine (FLEXERIL) 10 mg tablet Take  by mouth three (3) times  daily as needed for Muscle Spasm(s).   Yes Provider, Historical   LORazepam (ATIVAN) 1 mg tablet Take 1 Tab by mouth daily for 90 days. Max Daily Amount: 1 mg. 12/07/18 03/15/19  [name], MD   prochlorperazine (COMPAZINE) 10 mg tablet TAKE 1/2 TABLET BY MOUTH EVERY 6 HOURS AS NEEDED 09/19/18 03/15/19  [name], MD     Patient Active Problem List   Diagnosis Code   ??? Chronic anemia D64.9   ??? Controlled type 2 diabetes mellitus without complication, without long-term current use of insulin ([place]) E11.9   ???  Essential hypertension I10   ??? Hyperlipidemia E78.5   ??? History of stroke Z86.73   ??? Anxiety F41.9   ??? Chronic pain G89.29   ??? Iron deficiency anemia D50.9   ??? 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 Other cirrhosis of liver (HCC) K74.69   ??? Depression F32.9   ??? Type II diabetes mellitus (HCC) E11.9   ??? HTN (hypertension) I10   ??? 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 Stroke (HCC) I63.9   ??? Compressed vertebrae [id number]   ??? Chronic back pain greater than 3 months duration M54.9, G89.29   ??? Hyperlipemia E78.5   ??? Anxiety F41.9   ??? 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 Iron deficiency anemia secondary to inadequate dietary iron intake D50.8   ??? 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 Thrombocytopenia ([place]) D69.6   ??? 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 Benign prostatic hyperplasia (BPH) with straining on urination N40.1, R39.16   ??? 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 Dermatitis L30.9   ??? Migraine without aura and without status migrainosus, not intractable G43.009   ??? Restless leg syndrome G25.81   ??? Peripheral vascular disease (HCC) I73.9   ??? Varices of esophagus determined by endoscopy ([place]) I85.00     Patient Active Problem List    Diagnosis Date Noted   ??? 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 Peripheral vascular disease ([place]) 02/21/2018    ??? Restless leg syndrome 12/04/2017   ??? Migraine without aura and without status migrainosus, not intractable 04/15/2017   ??? 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 Dermatitis 01/05/2017   ??? Benign prostatic hyperplasia (BPH) with straining on urination 12/31/2016   ??? 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 Myelodysplastic syndrome ([place]) 12/22/2016   ??? Type 2 diabetes with nephropathy ([place]) 10/18/2016   ??? Iron deficiency anemia secondary to inadequate dietary iron intake 09/23/2016   ??? Chronic leukopenia 09/23/2016   ??? Thrombocytopenia ([place]) 09/23/2016   ??? Moderate major depression ([place]) 09/10/2016   ??? Type II diabetes mellitus ([place]) 08/03/2016   ??? HTN (hypertension) 08/03/2016   ??? 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 Stroke ([place]) 08/03/2016   ??? Compressed vertebrae 08/03/2016   ??? Chronic back pain greater than 3 months duration 08/03/2016   ??? Hyperlipemia 08/03/2016   ??? Anxiety 08/03/2016   ??? Depression 06/30/2016   ??? Iron deficiency anemia 04/22/2016   ??? NAFLD (nonalcoholic fatty liver disease) 04/22/2016   ??? Other cirrhosis of liver ([place]) 04/22/2016   ??? Chronic anemia 03/18/2016   ??? Controlled type 2 diabetes mellitus without complication, without long-term current use of insulin ([place]) 03/18/2016   ??? Essential hypertension 03/18/2016   ??? Hyperlipidemia 03/18/2016   ??? History of stroke 03/18/2016   ??? Anxiety 03/18/2016   ??? Chronic pain 03/18/2016     Current Outpatient Medications   Medication Sig Dispense Refill   ??? prochlorperazine (COMPAZINE) 10 mg tablet Take 1 Tab by mouth every six (6) hours as needed for Nausea or Vomiting. 30 Tab 1   ??? LORazepam (ATIVAN) 1 mg tablet Take 1 Tab by mouth daily for 90 days. Max Daily Amount: 1 mg. 90 Tab 0   ??? metFORMIN (GLUCOPHAGE) 500 mg tablet TAKE 2 TABLETS BY MOUTH TWICE DAILY WITH MEALS 360 Tab 0   ??? sucralfate (  CARAFATE) 1 gram tablet TAKE 1 TABLET BY MOUTH FOUR TIMES DAILY AT BEDTIME WITH MEALS 360 Tab 0    ??? propranoloL (INDERAL) 20 mg tablet TAKE 1 TABLET BY MOUTH TWICE DAILY 180 Tab 0   ??? finasteride (PROSCAR) 5 mg tablet TAKE 1 TABLET BY MOUTH DAILY 90 Tab 0   ??? gemfibroziL (LOPID) 600 mg tablet TAKE 1 TABLET BY MOUTH TWICE DAILY 180 Tab 0   ??? topiramate (TOPAMAX) 100 mg tablet TAKE 1 TABLET BY MOUTH DAILY 90 Tab 0   ??? venlafaxine-SR (Effexor XR) 150 mg capsule Take 150 mg by mouth daily.     ??? rosuvastatin (CRESTOR) 10 mg tablet TAKE 1 TABLET BY MOUTH NIGHTLY 90 Tab 1   ??? aspirin (ASPIRIN) 325 mg tablet Take 325 mg by mouth daily.     ??? lancets (OneTouch UltraSoft Lancets) misc USE TO TEST BLOOD 4 TIMES DAILY 100 Each 5   ??? HumuLIN R Regular U-100 Insuln 100 unit/mL injection INJECT AS NEEDED PER SLIDING SCALE (Patient taking differently: INJECT AS NEEDED PER SLIDING SCALE Max dose 20 units daily) 10 mL 3   ??? Insulin Syringe-Needle U-100 (BD INSULIN SYRINGE ULTRA-FINE) 0.3 mL 31 gauge x 5/16" syrg Use 4 times daily to inject insulin 400 Syringe 1   ??? Insulin Needles, Disposable, (BD ULTRA-FINE SHORT PEN NEEDLE) 31 gauge x 5/16" ndle Use 4 times daily to inject insulin. 1 Package 11   ??? Blood-Glucose Meter (ONETOUCH ULTRA2 METER) misc Use to check blood sugar BID 1 Each 0   ??? esomeprazole (NEXIUM) 20 mg capsule Take  by mouth daily.     ??? metoclopramide HCl (REGLAN) 10 mg tablet Take 10 mg by mouth Before breakfast, lunch, dinner and at bedtime.     ??? pramipexole (MIRAPEX) 0.125 mg tablet TAKE 1 TABLET BY MOUTH NIGHTLY 30 Tab 0   ??? glucose blood VI test strips (ASCENSIA AUTODISC VI, ONE TOUCH ULTRA TEST VI) strip Use to test blood sugars four times daily with Onetouch ultra 2. 400 Strip 3   ??? oxyCODONE IR (ROXICODONE) 10 mg tab immediate release tablet Take 10 mg by mouth every six (6) hours as needed for Pain.     ??? Insulin Syringe-Needle U-100 1 mL 29 gauge x 1/2" syrg Three times per day and as needed as per sliding scale 100 Syringe 6   ??? naloxone (NARCAN) 4 mg/actuation nasal spray Use 1 spray intranasally  into 1 nostril. Use a new Narcan nasal spray for subsequent doses and administer into alternating nostrils. May repeat every 2 to 3 minutes as needed for opioid overdose symptoms. 1 Each 0   ??? cholecalciferol, vitamin D3, (VITAMIN D3) 2,000 unit tab Take  by mouth.     ??? cyclobenzaprine (FLEXERIL) 10 mg tablet Take  by mouth three (3) times daily as needed for Muscle Spasm(s).       Allergies   Allergen Reactions   ??? Doxycycline Hives   ??? Doxycycline Rash   ??? Gabapentin Other (comments)     Per pt became violent    ??? Gabapentin Other (comments)     Slurred speech   ??? Lyrica [Pregabalin] Other (comments)     Per pt stroke symptoms   ??? Lyrica [Pregabalin] Anxiety   ??? Penicillin G Rash   ??? Penicillins Hives     Past Medical History:   Diagnosis Date   ??? Anemia    ??? Anxiety    ??? Chronic pain    ??? Cirrhosis  of liver ([place])    ??? Diabetes ([place])    ??? GERD (gastroesophageal reflux disease)    ??? Hyperlipemia    ??? Hypertension    ??? Hypotension    ??? Migraine    ??? Other cirrhosis of liver ([place])    ??? Stroke [facility])     had 3 strokes    ??? Thrombocytopenia ([place])      Past Surgical History:   Procedure Laterality Date   ??? HX CHOLECYSTECTOMY     ??? HX ORTHOPAEDIC      R hand sx   ??? HX OTHER SURGICAL      Hand surgery- Nail gun went through finger     Family History   Problem Relation Age of Onset   ??? Alzheimer Mother    ??? Diabetes Mother    ??? Cancer Mother         colon cancer    ??? Hypertension Father    ??? Heart Disease Father    ??? Cancer Father         prostate, lung cancer    ??? Diabetes Sister    ??? Heart Disease Sister    ??? Diabetes Brother      Social History     Tobacco Use   ??? Smoking status: Former Smoker   ??? Smokeless tobacco: Never Used   ??? Tobacco comment: quit years ago   Substance Use Topics   ??? Alcohol use: No       Review of Systems   Constitutional: Negative for chills, fever, malaise/fatigue and weight loss.   HENT: Positive for hearing loss. Negative for congestion, ear discharge,  ear pain, nosebleeds, sinus pain and sore throat.    Eyes: Negative for blurred vision, double vision and discharge.   Respiratory: Negative for cough and shortness of breath.    Cardiovascular: Negative for chest pain, palpitations, claudication and leg swelling.   Gastrointestinal: Positive for abdominal pain. Negative for blood in stool, constipation, diarrhea, heartburn, melena, nausea and vomiting.   Genitourinary: Negative for dysuria, frequency and urgency.   Musculoskeletal: Positive for back pain. Negative for myalgias.   Skin: Negative for itching and rash.   Neurological: Positive for headaches. Negative for dizziness, tingling, sensory change, speech change, focal weakness and weakness.       Objective:     Patient-Reported Vitals 02/23/2019   Patient-Reported Weight 125 lb   Patient-Reported Pulse 79   Patient-Reported Systolic  [id number]   Patient-Reported Diastolic 62      General: alert, cooperative, no distress   Mental  status: normal mood, behavior, speech, dress, motor activity, and thought processes, able to follow commands   HENT: NCAT   Neck: no visualized mass   Resp: no respiratory distress   Neuro: no gross deficits   Skin: no discoloration or lesions of concern on visible areas   Psychiatric: normal affect, consistent with stated mood, no evidence of hallucinations     Additional exam findings:       We discussed the expected course, resolution and complications of the diagnosis(es) in detail.  Medication risks, benefits, costs, interactions, and alternatives were discussed as indicated.  I advised him to contact the office if his condition worsens, changes or fails to improve as anticipated. He expressed understanding with the diagnosis(es) and plan.       Sherryll Burger Sr., who was evaluated through a patient-initiated, synchronous (real-time) audio-video encounter, and/or his healthcare decision maker, is aware that it is a billable  service, with coverage as  determined by his insurance carrier. He provided verbal consent to proceed: Yes, and patient identification was verified. It was conducted pursuant to the emergency declaration under the [place], [place] waiver authority and the [employer] Act. A caregiver was present when appropriate. Ability to conduct physical exam was limited. I was in the office. The patient was at home.      [name], MD

## 2019-03-15 NOTE — Telephone Encounter (Signed)
Please schedule medicare wellness due 05/17/19

## 2019-03-26 ENCOUNTER — Encounter

## 2019-03-27 NOTE — Telephone Encounter (Signed)
Scheduled for Friday at 10:30

## 2019-03-27 NOTE — Telephone Encounter (Signed)
Patient's wife called to schedule a flu shot for the patient. Patient prefers a Thursday or Friday.

## 2019-03-28 ENCOUNTER — Encounter: Payer: MEDICARE | Primary: Legal Medicine

## 2019-03-28 MED ORDER — FINASTERIDE 5 MG TAB
5 mg | ORAL_TABLET | ORAL | 0 refills | Status: DC
Start: 2019-03-28 — End: 2019-06-25

## 2019-03-28 MED ORDER — TOPIRAMATE 100 MG TAB
100 mg | ORAL_TABLET | ORAL | 0 refills | Status: DC
Start: 2019-03-28 — End: 2019-07-02

## 2019-03-28 MED ORDER — GEMFIBROZIL 600 MG TAB
600 mg | ORAL_TABLET | ORAL | 0 refills | Status: DC
Start: 2019-03-28 — End: 2019-04-30

## 2019-03-30 ENCOUNTER — Institutional Professional Consult (permissible substitution): Admit: 2019-03-30 | Discharge: 2019-03-30 | Payer: MEDICARE | Attending: Legal Medicine | Primary: Legal Medicine

## 2019-03-30 DIAGNOSIS — K4090 Unilateral inguinal hernia, without obstruction or gangrene, not specified as recurrent: Secondary | ICD-10-CM

## 2019-03-30 NOTE — Addendum Note (Signed)
Addended by: [name] on: 03/30/2019 02:07 PM     Modules accepted: Orders, Level of Service

## 2019-03-30 NOTE — Progress Notes (Signed)
William Blalock Wolfert Sr.   No chief complaint on file.    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 Anemia    ??? Anxiety    ??? Chronic pain    ??? Cirrhosis of liver ([place])    ??? Diabetes ([place])    ??? GERD (gastroesophageal reflux disease)    ??? Hyperlipemia    ??? Hypertension    ??? Hypotension    ??? Migraine    ??? Other cirrhosis of liver ([place])    ??? Stroke [facility])     had 3 strokes    ??? Thrombocytopenia ([place])      Past Surgical History:   Procedure Laterality Date   ??? HX CHOLECYSTECTOMY     ??? HX ORTHOPAEDIC      R hand sx   ??? HX OTHER SURGICAL      Hand surgery- Nail gun went through finger     Social History     Tobacco Use   ??? Smoking status: Former Smoker   ??? Smokeless tobacco: Never Used   ??? Tobacco comment: quit years ago   Substance Use Topics   ??? Alcohol use: No       Family History   Problem Relation Age of Onset   ??? Alzheimer Mother    ??? Diabetes Mother    ??? Cancer Mother         colon cancer    ??? Hypertension Father    ??? Heart Disease Father    ??? Cancer Father         prostate, lung cancer    ??? Diabetes Sister    ??? Heart Disease Sister    ??? Diabetes Brother        Review of Systems   Constitutional: Negative for chills and fever.   HENT: Positive for hearing loss. Negative for sore throat.     Eyes: Negative for blurred vision and double vision.   Respiratory: Negative for cough, hemoptysis, sputum production, shortness of breath and wheezing.    Cardiovascular: Negative for chest pain, palpitations, orthopnea and claudication.   Gastrointestinal: Positive for abdominal pain, heartburn and nausea. Negative for blood in stool, constipation and melena.   Genitourinary: Negative for dysuria, frequency and urgency.   Musculoskeletal: Positive for back pain.   Neurological: Negative for dizziness, tingling, tremors, sensory change, speech change, focal weakness and headaches.       Physical Exam  Vitals signs and nursing note reviewed. Exam conducted with a chaperone present.   Constitutional:       General: He is not in acute distress.  Appearance: Normal appearance. He is well-developed. He is not diaphoretic.   HENT:      Head: Normocephalic and atraumatic.      Mouth/Throat:      Pharynx: No oropharyngeal exudate.   Eyes:      General: No scleral icterus.        Right eye: No discharge.         Left eye: No discharge.      Conjunctiva/sclera: Conjunctivae normal.      Pupils: Pupils are equal, round, and reactive to light.   Neck:      Musculoskeletal: Normal range of motion and neck supple.      Thyroid: No thyromegaly.   Cardiovascular:      Rate and Rhythm: Normal rate and regular rhythm.      Pulses: Normal pulses.      Heart sounds: Normal heart sounds.   Pulmonary:      Effort: Pulmonary effort is normal. No respiratory distress.      Breath sounds: Normal breath sounds. No rales.   Abdominal:      General: Bowel sounds are normal. There is no distension.      Palpations: Abdomen is soft. There is no mass.      Tenderness: There is abdominal tenderness. There is no rebound.      Comments: patient has epigastric tenderness which he has for long time  No rebound       Left inguinal bulge about 7 to 8 cm non tender and educable    Musculoskeletal: Normal range of motion.          General: No tenderness or deformity.   Lymphadenopathy:      Cervical: No cervical adenopathy.   Skin:     General: Skin is warm and dry.      Findings: No erythema or rash.   Neurological:      General: No focal deficit present.      Mental Status: He is alert and oriented to person, place, and time. Mental status is at baseline.      Cranial Nerves: No cranial nerve deficit.      Coordination: Coordination normal.   Psychiatric:         Thought Content: Thought content normal.         Judgment: Judgment normal.          Assessment and plan     Plan of care has been discussed with the patient, he agrees to the plan and verbalized understanding.  All his questions were answered  More than 50% of the time spent in this visit was counseling the patient about  illness and treatment options         1. Needs flu shot  Flu vaccine was given with no complications  Given     [id number]         - FLU (FLUAD QUAD INFLUENZA VACCINE,QUAD,ADJUVANTED)    2. Reducible left inguinal hernia       Urgent referral for evaluation by  Surgery    They prefer  [facility]  Advised patient and his spouse if there is increased pain or abdominal hernia is not reducible he has to go to emergency room they agreed and verbalized understanding    - REFERRAL TO GENERAL SURGERY    Current Outpatient Medications   Medication Sig Dispense Refill   ??? finasteride (PROSCAR) 5 mg tablet TAKE 1 TABLET BY MOUTH DAILY 90 Tab 0   ??? topiramate (TOPAMAX) 100  mg tablet TAKE 1 TABLET BY MOUTH DAILY 90 Tab 0   ??? gemfibroziL (LOPID) 600 mg tablet TAKE 1 TABLET BY MOUTH TWICE DAILY 180 Tab 0   ??? prochlorperazine (COMPAZINE) 10 mg tablet Take 1 Tab by mouth every six (6) hours as needed for Nausea or Vomiting. 30 Tab 1   ??? LORazepam (ATIVAN) 1 mg tablet Take 1 Tab by mouth daily for 90 days. Max Daily Amount: 1 mg. 90 Tab 0   ??? metFORMIN (GLUCOPHAGE) 500 mg tablet TAKE 2 TABLETS BY MOUTH TWICE DAILY WITH MEALS 360 Tab 0    ??? sucralfate (CARAFATE) 1 gram tablet TAKE 1 TABLET BY MOUTH FOUR TIMES DAILY AT BEDTIME WITH MEALS 360 Tab 0   ??? propranoloL (INDERAL) 20 mg tablet TAKE 1 TABLET BY MOUTH TWICE DAILY 180 Tab 0   ??? venlafaxine-SR (Effexor XR) 150 mg capsule Take 150 mg by mouth daily.     ??? rosuvastatin (CRESTOR) 10 mg tablet TAKE 1 TABLET BY MOUTH NIGHTLY 90 Tab 1   ??? aspirin (ASPIRIN) 325 mg tablet Take 325 mg by mouth daily.     ??? lancets (OneTouch UltraSoft Lancets) misc USE TO TEST BLOOD 4 TIMES DAILY 100 Each 5   ??? HumuLIN R Regular U-100 Insuln 100 unit/mL injection INJECT AS NEEDED PER SLIDING SCALE (Patient taking differently: INJECT AS NEEDED PER SLIDING SCALE Max dose 20 units daily) 10 mL 3   ??? Insulin Syringe-Needle U-100 (BD INSULIN SYRINGE ULTRA-FINE) 0.3 mL 31 gauge x 5/16" syrg Use 4 times daily to inject insulin 400 Syringe 1   ??? Blood-Glucose Meter (ONETOUCH ULTRA2 METER) misc Use to check blood sugar BID 1 Each 0   ??? esomeprazole (NEXIUM) 20 mg capsule Take  by mouth daily.     ??? metoclopramide HCl (REGLAN) 10 mg tablet Take 10 mg by mouth Before breakfast, lunch, dinner and at bedtime.     ??? pramipexole (MIRAPEX) 0.125 mg tablet TAKE 1 TABLET BY MOUTH NIGHTLY 30 Tab 0   ??? glucose blood VI test strips (ASCENSIA AUTODISC VI, ONE TOUCH ULTRA TEST VI) strip Use to test blood sugars four times daily with Onetouch ultra 2. 400 Strip 3   ??? oxyCODONE IR (ROXICODONE) 10 mg tab immediate release tablet Take 10 mg by mouth every six (6) hours as needed for Pain.     ??? Insulin Syringe-Needle U-100 1 mL 29 gauge x 1/2" syrg Three times per day and as needed as per sliding scale 100 Syringe 6   ??? naloxone (NARCAN) 4 mg/actuation nasal spray Use 1 spray intranasally into 1 nostril. Use a new Narcan nasal spray for subsequent doses and administer into alternating nostrils. May repeat every 2 to 3 minutes as needed for opioid overdose symptoms. 1 Each 0   ??? cholecalciferol, vitamin D3, (VITAMIN D3) 2,000 unit tab Take  by mouth.      ??? cyclobenzaprine (FLEXERIL) 10 mg tablet Take  by mouth three (3) times daily as needed for Muscle Spasm(s).     ??? Insulin Needles, Disposable, (BD ULTRA-FINE SHORT PEN NEEDLE) 31 gauge x 5/16" ndle Use 4 times daily to inject insulin. 1 Package 11       Patient Active Problem List    Diagnosis Date Noted   ??? 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) 03/15/2019   ??? Peripheral vascular disease ([place]) 02/21/2018   ??? Restless leg syndrome 12/04/2017   ??? Migraine without aura and without status migrainosus, not intractable 04/15/2017   ???  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 Dermatitis 01/05/2017   ??? Benign prostatic hyperplasia (BPH) with straining on urination 12/31/2016   ??? 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 Myelodysplastic syndrome ([place]) 12/22/2016   ??? 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 Iron deficiency anemia secondary to inadequate dietary iron intake 09/23/2016   ??? Chronic leukopenia 09/23/2016   ??? Thrombocytopenia ([place]) 09/23/2016   ??? 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 Type II diabetes mellitus ([place]) 08/03/2016   ??? HTN (hypertension) 08/03/2016   ??? 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 Stroke ([place]) 08/03/2016   ??? Compressed vertebrae 08/03/2016   ??? Chronic back pain greater than 3 months duration 08/03/2016   ??? Hyperlipemia 08/03/2016   ??? Anxiety 08/03/2016   ??? Depression 06/30/2016   ??? Iron deficiency anemia 04/22/2016   ??? NAFLD (nonalcoholic fatty liver disease) 04/22/2016   ??? Other cirrhosis of liver ([place]) 04/22/2016   ??? Chronic anemia 03/18/2016   ??? Controlled type 2 diabetes mellitus without complication, without long-term current use of insulin ([place]) 03/18/2016   ??? Essential hypertension 03/18/2016   ??? Hyperlipidemia 03/18/2016   ??? History of stroke 03/18/2016   ??? Anxiety 03/18/2016   ??? Chronic pain 03/18/2016     Results for orders placed or performed in visit on 02/05/19   DUPLEX ABD VISC ART ORGANS COMPLETE    Result Value Ref Range    Celiac PSV 91.3 cm/s    Dist SMA PSV 151.2 cm/s    Mid SMA PSV 167.4 cm/s    Prox SMA PSV 198.5 cm/s    Splenic PSV 88.9 cm/s    Common Hepatic PSV 120.8 cm/s    Abdominal prox aorta vel 75.1 cm/s     Ancillary Procedure on 02/05/2019   Component Date Value Ref Range Status   ??? Celiac PSV 02/05/2019 91.3  cm/s Final   ??? Dist SMA PSV 02/05/2019 151.2  cm/s Final   ??? Mid SMA PSV 02/05/2019 167.4  cm/s Final   ??? Prox SMA PSV 02/05/2019 198.5  cm/s Final   ??? Splenic PSV 02/05/2019 88.9  cm/s Final   ??? Common Hepatic PSV 02/05/2019 120.8  cm/s Final   ??? Abdominal prox aorta vel 02/05/2019 75.1  cm/s Final   Ancillary Procedure on 02/05/2019   Component Date Value Ref Range Status   ??? Left arm BP 02/05/2019 119  mmHg Final   ??? Left posterior tibial 02/05/2019 129  mmHg Final   ??? Left anterior tibial 02/05/2019 140  mmHg Final   ??? Left toe pressure 02/05/2019 92  mmHg Final   ??? Right arm BP 02/05/2019 126  mmHg Final   ??? Right posterior tibial 02/05/2019 132  mmHg Final   ??? Right anterior tibial 02/05/2019 137  mmHg Final   ??? Right toe pressure 02/05/2019 94  mmHg Final   ??? Left ABI 02/05/2019 1.11   Final   ??? Right ABI 02/05/2019 1.09   Final   ??? Left TBI 02/05/2019 0.73   Final   ??? Right TBI 02/05/2019 0.75   Final   Hospital Outpatient Visit on 01/31/2019   Component Date Value Ref Range Status   ??? WBC 01/31/2019 3.8* 4.5 - 13.0 K/uL Final   ??? RBC 01/31/2019 3.69* 4.10 - 5.10 M/uL Final   ??? HGB 01/31/2019 11.8* 12.0 - 16 g/dL Final   ??? HCT 01/31/2019 35.8* 36 - 48 % Final   ??? MCV  01/31/2019 97.0  78 - 102 FL Final   ??? MCH 01/31/2019 32.0  25.0 - 35.0 PG Final   ??? MCHC 01/31/2019 33.0  31 - 37 g/dL Final   ??? RDW 01/31/2019 15.1* 11.5 - 14.5 % Final   ??? PLATELET 01/31/2019 78* 140 - 440 K/uL Final   ??? NEUTROPHILS 01/31/2019 77* 40 - 70 % Final   ??? MIXED CELLS 01/31/2019 10  0.1 - 17 % Final   ??? LYMPHOCYTES 01/31/2019 13* 14 - 44 % Final   ??? ABS. NEUTROPHILS 01/31/2019 2.9  1.8 - 9.5 K/UL Final    ??? ABS. MIXED CELLS 01/31/2019 0.4  0.0 - 2.3 K/uL Final   ??? ABS. LYMPHOCYTES 01/31/2019 0.5* 1.1 - 5.9 K/UL Final    Test performed at [place] or Outpatient Infusion Center Location. Reviewed by Medical Director.   ??? DF 01/31/2019 AUTOMATED    Final   Hospital Outpatient Visit on 01/03/2019   Component Date Value Ref Range Status   ??? WBC 01/03/2019 3.6* 4.5 - 13.0 K/uL Final   ??? RBC 01/03/2019 3.68* 4.10 - 5.10 M/uL Final   ??? HGB 01/03/2019 11.8* 12.0 - 16 g/dL Final   ??? HCT 01/03/2019 35.3* 36 - 48 % Final   ??? MCV 01/03/2019 95.9  78 - 102 FL Final   ??? MCH 01/03/2019 32.1  25.0 - 35.0 PG Final   ??? MCHC 01/03/2019 33.4  31 - 37 g/dL Final   ??? RDW 01/03/2019 13.7  11.5 - 14.5 % Final   ??? PLATELET 01/03/2019 88* 140 - 440 K/uL Final   ??? NEUTROPHILS 01/03/2019 76* 40 - 70 % Final   ??? MIXED CELLS 01/03/2019 12  0.1 - 17 % Final   ??? LYMPHOCYTES 01/03/2019 12* 14 - 44 % Final   ??? ABS. NEUTROPHILS 01/03/2019 2.8  1.8 - 9.5 K/UL Final   ??? ABS. MIXED CELLS 01/03/2019 0.4  0.0 - 2.3 K/uL Final   ??? ABS. LYMPHOCYTES 01/03/2019 0.4* 1.1 - 5.9 K/UL Final    Test performed at [place] or Outpatient Infusion Center Location. Reviewed by Medical Director.   ??? DF 01/03/2019 AUTOMATED    Final   ??? Sodium 01/03/2019 140  136 - 145 mmol/L Final   ??? Potassium 01/03/2019 4.4  3.5 - 5.5 mmol/L Final   ??? Chloride 01/03/2019 107  100 - 111 mmol/L Final   ??? CO2 01/03/2019 25  21 - 32 mmol/L Final   ??? Anion gap 01/03/2019 8  3.0 - 18 mmol/L Final   ??? Glucose 01/03/2019 148* 74 - 99 mg/dL Final   ??? BUN 01/03/2019 16  7.0 - 18 MG/DL Final   ??? Creatinine 01/03/2019 0.92  0.6 - 1.3 MG/DL Final   ??? BUN/Creatinine ratio 01/03/2019 17  12 - 20   Final   ??? GFR est AA 01/03/2019 >60  >60 ml/min/1.70m Final   ??? GFR est non-AA 01/03/2019 >60  >60 ml/min/1.767mFinal    Comment: (NOTE)  Estimated GFR is calculated using the Modification of Diet in Renal    Disease (MDRD) Study equation, reported for both African Americans   (GFRAA) and non-African Americans (GFRNA), and normalized to 1.7378m body surface area. The physician must decide which value applies to   the patient. The MDRD study equation should only be used in   individuals age 52 63 older. It has not been validated for the  following: pregnant women, patients with serious comorbid conditions,   or on certain medications, or persons with extremes of body size,   muscle mass, or nutritional status.     ??? Calcium 01/03/2019 9.3  8.5 - 10.1 MG/DL Final   ??? Bilirubin, total 01/03/2019 0.9  0.2 - 1.0 MG/DL Final   ??? ALT (SGPT) 01/03/2019 29  16 - 61 U/L Final   ??? AST (SGOT) 01/03/2019 29  10 - 38 U/L Final   ??? Alk. phosphatase 01/03/2019 219* 45 - 117 U/L Final   ??? Protein, total 01/03/2019 7.3  6.4 - 8.2 g/dL Final   ??? Albumin 01/03/2019 4.2  3.4 - 5.0 g/dL Final   ??? Globulin 01/03/2019 3.1  2.0 - 4.0 g/dL Final   ??? A-G Ratio 01/03/2019 1.4  0.8 - 1.7   Final   ??? Ferritin 01/03/2019 44  8 - 388 NG/ML Final   ??? Iron 01/03/2019 51  50 - 175 ug/dL Final    Patients receiving metal-binding drugs (e.g. deferoxamine) may show spuriously depressed iron values, as chelated iron may not properly react in the iron assay.   ??? TIBC 01/03/2019 381  250 - 450 ug/dL Final   ??? Iron % saturation 01/03/2019 13* 20 - 50 % Final          Follow-up and Dispositions    ?? Return if symptoms worsen or fail to improve.

## 2019-03-30 NOTE — Progress Notes (Signed)
Patient was seen today for a nurse visit.  He was feeling a little sluggish because he hadn't ate Breakfast and he is a diabetic.  Dr. Phineas Douglas instructed for patient to eat a quarter of a donut.  He stated he felt better after that.  Also, he show me a hernia in the right inguinal area.  I spoke with Dr. Phineas Douglas and she assessed it.    Flu Immunization/s administered 03/30/2019 by Delphina Cahill with patient's consent.    Patient tolerated procedure well.  No reactions noted.

## 2019-04-01 ENCOUNTER — Encounter

## 2019-04-04 MED ORDER — LANCETS
5 refills | Status: DC
Start: 2019-04-04 — End: 2019-09-15

## 2019-04-09 ENCOUNTER — Telehealth: Admit: 2019-04-09 | Discharge: 2019-04-09 | Payer: MEDICARE | Attending: Legal Medicine | Primary: Legal Medicine

## 2019-04-09 DIAGNOSIS — I700 Atherosclerosis of aorta: Secondary | ICD-10-CM

## 2019-04-09 NOTE — Telephone Encounter (Signed)
Scheduled patient today VV at 1:45pm

## 2019-04-09 NOTE — Progress Notes (Signed)
William Blalock Hazan Sr. is a 73 y.o. male who was seen by synchronous (real-time) audio-video technology on 04/09/2019 for Medication Evaluation (discuss results of X ray )    Received a copy of X ray of the lumbar spine done by pain management Dr.Pennington, [name], MD    FINDINGS:    VERTEBRAE AND ALIGNMENT: Segments are normal in height and alignment.    FLEXION/EXTENSION: No evidence of instability.    DISC SPACES: L5-S1 disc space narrowing.    PARASPINOUS SOFT TISSUES: Aortoiliac atherosclerosis.    He had seen vascular surgery  [name], MD, will refer him for evaluation for this  X ray findings       FINDINGS:    VERTEBRAE AND ALIGNMENT: Segments are normal in height and alignment.    FLEXION/EXTENSION: No evidence of instability.    DISC SPACES: L5-S1 disc space narrowing.    PARASPINOUS SOFT TISSUES: Aortoiliac atherosclerosis.      Assessment & Plan:   Diagnoses and all orders for this visit:    1. Aorto-iliac atherosclerosis ([place])  -     REFERRAL TO VASCULAR SURGERY    2. Rectal bleed    Recurrent GI bleeding following up with Dr.Martin if continuous belling contact Dr.Martin and go to ER     Anxiety   He is on Ativan and also he is on pain management medication he is high risk for side effects ,he understand that he is at risk for side effect from this combination  In form of respiratory dificulty ,he has naloxone  He understand the risk and has taken ativan once in the last 4 days , advised to decrease to 1/2 tablet as needed he agreed  and verbalized understanding    712  Subjective:       Prior to Admission medications    Medication Sig Start Date End Date Taking? Authorizing Provider   lancets (OneTouch UltraSoft Lancets) misc USE TO TEST BLOOD GLUCOSE FOUR TIMES DAILY 04/03/19  Yes Phineas Douglas, Aquil Duhe S, MD   finasteride (PROSCAR) 5 mg tablet TAKE 1 TABLET BY MOUTH DAILY 03/28/19  Yes Kysen Wetherington S, MD   topiramate (TOPAMAX) 100 mg tablet TAKE 1 TABLET BY MOUTH DAILY 03/28/19   Yes Livia Tarr S, MD   gemfibroziL (LOPID) 600 mg tablet TAKE 1 TABLET BY MOUTH TWICE DAILY 03/28/19  Yes [name], MD   prochlorperazine (COMPAZINE) 10 mg tablet Take 1 Tab by mouth every six (6) hours as needed for Nausea or Vomiting. 03/15/19  Yes Rangel Echeverri S, MD   LORazepam (ATIVAN) 1 mg tablet Take 1 Tab by mouth daily for 90 days. Max Daily Amount: 1 mg. 03/15/19 06/13/19 Yes [name] S, MD   metFORMIN (GLUCOPHAGE) 500 mg tablet TAKE 2 TABLETS BY MOUTH TWICE DAILY WITH MEALS 03/09/19  Yes [name] S, MD   sucralfate (CARAFATE) 1 gram tablet TAKE 1 TABLET BY MOUTH FOUR TIMES DAILY AT BEDTIME WITH MEALS 01/10/19  Yes [name] S, MD   propranoloL (INDERAL) 20 mg tablet TAKE 1 TABLET BY MOUTH TWICE DAILY 01/10/19  Yes [name] S, MD   rosuvastatin (CRESTOR) 10 mg tablet TAKE 1 TABLET BY MOUTH NIGHTLY 11/10/18  Yes [name] S, MD   aspirin (ASPIRIN) 325 mg tablet Take 325 mg by mouth daily. 10/25/18  Yes Provider, Historical   HumuLIN R Regular U-100 Insuln 100 unit/mL injection INJECT AS NEEDED PER SLIDING SCALE  Patient taking differently: INJECT AS NEEDED PER SLIDING SCALE  Max dose 20 units daily 10/17/18  Yes [name] S, MD   Insulin Needles, Disposable, (BD ULTRA-FINE SHORT PEN NEEDLE) 31 gauge x 5/16" ndle Use 4 times daily to inject insulin. 08/22/18  Yes [name], MD   Blood-Glucose Meter (ONETOUCH ULTRA2 METER) misc Use to check blood sugar BID 08/22/18  Yes [name] S, MD   esomeprazole (NEXIUM) 20 mg capsule Take  by mouth daily.   Yes Provider, Historical   metoclopramide HCl (REGLAN) 10 mg tablet Take 10 mg by mouth Before breakfast, lunch, dinner and at bedtime.   Yes Provider, Historical   glucose blood VI test strips (ASCENSIA AUTODISC VI, ONE TOUCH ULTRA TEST VI) strip Use to test blood sugars four times daily with Onetouch ultra 2. 10/17/17  Yes [name] S, MD    oxyCODONE IR (ROXICODONE) 10 mg tab immediate release tablet Take 10 mg by mouth every six (6) hours as needed for Pain.   Yes Provider, Historical   Insulin Syringe-Needle U-100 1 mL 29 gauge x 1/2" syrg Three times per day and as needed as per sliding scale 03/31/17  Yes [name] S, MD   naloxone (NARCAN) 4 mg/actuation nasal spray Use 1 spray intranasally into 1 nostril. Use a new Narcan nasal spray for subsequent doses and administer into alternating nostrils. May repeat every 2 to 3 minutes as needed for opioid overdose symptoms. 01/01/17  Yes [name], MD   cholecalciferol, vitamin D3, (VITAMIN D3) 2,000 unit tab Take  by mouth.   Yes Provider, Historical   cyclobenzaprine (FLEXERIL) 10 mg tablet Take  by mouth three (3) times daily as needed for Muscle Spasm(s).   Yes Provider, Historical   venlafaxine-SR (Effexor XR) 150 mg capsule Take 150 mg by mouth daily.    Provider, Historical   Insulin Syringe-Needle U-100 (BD INSULIN SYRINGE ULTRA-FINE) 0.3 mL 31 gauge x 5/16" syrg Use 4 times daily to inject insulin 08/23/18   [name] S, MD   pramipexole (MIRAPEX) 0.125 mg tablet TAKE 1 TABLET BY MOUTH NIGHTLY 12/30/17   [name], MD     Patient Active Problem List   Diagnosis Code   ??? Chronic anemia D64.9   ??? Controlled type 2 diabetes mellitus without complication, without long-term current use of insulin ([place]) E11.9   ??? Essential hypertension I10   ??? Hyperlipidemia E78.5   ??? History of stroke Z86.73   ??? Anxiety F41.9   ??? Chronic pain G89.29   ??? Iron deficiency anemia D50.9   ??? 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 Other cirrhosis of liver (HCC) K74.69   ??? Depression F32.9   ??? Type II diabetes mellitus (HCC) E11.9   ??? HTN (hypertension) I10   ??? 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 Stroke (HCC) I63.9   ??? Compressed vertebrae [id number]   ??? Chronic back pain greater than 3 months duration M54.9, G89.29   ??? Hyperlipemia E78.5   ??? Anxiety F41.9   ??? 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 Iron deficiency anemia secondary to inadequate dietary iron intake D50.8   ??? 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 Thrombocytopenia ([place]) D69.6   ??? 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 Benign prostatic hyperplasia (BPH) with straining on urination N40.1, R39.16   ??? 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 Dermatitis L30.9   ???  Migraine without aura and without status migrainosus, not intractable G43.009   ??? Restless leg syndrome G25.81   ??? Peripheral vascular disease (HCC) I73.9   ??? Varices of esophagus determined by endoscopy ([place]) I85.00     Patient Active Problem List    Diagnosis Date Noted   ??? Varices of esophagus determined by endoscopy ([place]) 03/15/2019   ??? Peripheral vascular disease ([place]) 02/21/2018   ??? Restless leg syndrome 12/04/2017   ??? Migraine without aura and without status migrainosus, not intractable 04/15/2017   ??? 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 Dermatitis 01/05/2017   ??? Benign prostatic hyperplasia (BPH) with straining on urination 12/31/2016   ??? 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 Myelodysplastic syndrome ([place]) 12/22/2016   ??? Type 2 diabetes with nephropathy ([place]) 10/18/2016   ??? Iron deficiency anemia secondary to inadequate dietary iron intake 09/23/2016   ??? Chronic leukopenia 09/23/2016   ??? Thrombocytopenia ([place]) 09/23/2016   ??? Moderate major depression ([place]) 09/10/2016   ??? Type II diabetes mellitus ([place]) 08/03/2016   ??? HTN (hypertension) 08/03/2016   ??? 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 Stroke ([place]) 08/03/2016   ??? Compressed vertebrae 08/03/2016   ??? Chronic back pain greater than 3 months duration 08/03/2016   ??? Hyperlipemia 08/03/2016   ??? Anxiety 08/03/2016   ??? Depression 06/30/2016   ??? Iron deficiency anemia 04/22/2016   ??? NAFLD (nonalcoholic fatty liver disease) 04/22/2016   ??? Other cirrhosis of liver ([place]) 04/22/2016   ??? Chronic anemia 03/18/2016    ??? Controlled type 2 diabetes mellitus without complication, without long-term current use of insulin ([place]) 03/18/2016   ??? Essential hypertension 03/18/2016   ??? Hyperlipidemia 03/18/2016   ??? History of stroke 03/18/2016   ??? Anxiety 03/18/2016   ??? Chronic pain 03/18/2016     Current Outpatient Medications   Medication Sig Dispense Refill   ??? lancets (OneTouch UltraSoft Lancets) misc USE TO TEST BLOOD GLUCOSE FOUR TIMES DAILY 100 Each 5   ??? finasteride (PROSCAR) 5 mg tablet TAKE 1 TABLET BY MOUTH DAILY 90 Tab 0   ??? topiramate (TOPAMAX) 100 mg tablet TAKE 1 TABLET BY MOUTH DAILY 90 Tab 0   ??? gemfibroziL (LOPID) 600 mg tablet TAKE 1 TABLET BY MOUTH TWICE DAILY 180 Tab 0   ??? prochlorperazine (COMPAZINE) 10 mg tablet Take 1 Tab by mouth every six (6) hours as needed for Nausea or Vomiting. 30 Tab 1   ??? LORazepam (ATIVAN) 1 mg tablet Take 1 Tab by mouth daily for 90 days. Max Daily Amount: 1 mg. 90 Tab 0   ??? metFORMIN (GLUCOPHAGE) 500 mg tablet TAKE 2 TABLETS BY MOUTH TWICE DAILY WITH MEALS 360 Tab 0   ??? sucralfate (CARAFATE) 1 gram tablet TAKE 1 TABLET BY MOUTH FOUR TIMES DAILY AT BEDTIME WITH MEALS 360 Tab 0   ??? propranoloL (INDERAL) 20 mg tablet TAKE 1 TABLET BY MOUTH TWICE DAILY 180 Tab 0   ??? rosuvastatin (CRESTOR) 10 mg tablet TAKE 1 TABLET BY MOUTH NIGHTLY 90 Tab 1   ??? aspirin (ASPIRIN) 325 mg tablet Take 325 mg by mouth daily.     ??? HumuLIN R Regular U-100 Insuln 100 unit/mL injection INJECT AS NEEDED PER SLIDING SCALE (Patient taking differently: INJECT AS NEEDED PER SLIDING SCALE Max dose 20 units daily) 10 mL 3   ??? Insulin Needles, Disposable, (BD ULTRA-FINE SHORT PEN NEEDLE) 31 gauge x 5/16" ndle Use 4 times daily to inject insulin. 1 Package  11   ??? Blood-Glucose Meter (ONETOUCH ULTRA2 METER) misc Use to check blood sugar BID 1 Each 0   ??? esomeprazole (NEXIUM) 20 mg capsule Take  by mouth daily.     ??? metoclopramide HCl (REGLAN) 10 mg tablet Take 10 mg by mouth Before breakfast, lunch, dinner and at bedtime.      ??? glucose blood VI test strips (ASCENSIA AUTODISC VI, ONE TOUCH ULTRA TEST VI) strip Use to test blood sugars four times daily with Onetouch ultra 2. 400 Strip 3   ??? oxyCODONE IR (ROXICODONE) 10 mg tab immediate release tablet Take 10 mg by mouth every six (6) hours as needed for Pain.     ??? Insulin Syringe-Needle U-100 1 mL 29 gauge x 1/2" syrg Three times per day and as needed as per sliding scale 100 Syringe 6   ??? naloxone (NARCAN) 4 mg/actuation nasal spray Use 1 spray intranasally into 1 nostril. Use a new Narcan nasal spray for subsequent doses and administer into alternating nostrils. May repeat every 2 to 3 minutes as needed for opioid overdose symptoms. 1 Each 0   ??? cholecalciferol, vitamin D3, (VITAMIN D3) 2,000 unit tab Take  by mouth.     ??? cyclobenzaprine (FLEXERIL) 10 mg tablet Take  by mouth three (3) times daily as needed for Muscle Spasm(s).     ??? venlafaxine-SR (Effexor XR) 150 mg capsule Take 150 mg by mouth daily.     ??? Insulin Syringe-Needle U-100 (BD INSULIN SYRINGE ULTRA-FINE) 0.3 mL 31 gauge x 5/16" syrg Use 4 times daily to inject insulin 400 Syringe 1   ??? pramipexole (MIRAPEX) 0.125 mg tablet TAKE 1 TABLET BY MOUTH NIGHTLY 30 Tab 0     Allergies   Allergen Reactions   ??? Doxycycline Hives   ??? Doxycycline Rash   ??? Gabapentin Other (comments)     Per pt became violent    ??? Gabapentin Other (comments)     Slurred speech   ??? Lyrica [Pregabalin] Other (comments)     Per pt stroke symptoms   ??? Lyrica [Pregabalin] Anxiety   ??? Penicillin G Rash   ??? Penicillins Hives     Past Medical History:   Diagnosis Date   ??? Anemia    ??? Anxiety    ??? Chronic pain    ??? Cirrhosis of liver ([place])    ??? Diabetes ([place])    ??? GERD (gastroesophageal reflux disease)    ??? Hyperlipemia    ??? Hypertension    ??? Hypotension    ??? Migraine    ??? Other cirrhosis of liver ([place])    ??? Stroke [facility])     had 3 strokes    ??? Thrombocytopenia ([place])      Past Surgical History:   Procedure Laterality Date   ??? HX CHOLECYSTECTOMY      ??? HX ORTHOPAEDIC      R hand sx   ??? HX OTHER SURGICAL      Hand surgery- Nail gun went through finger     Family History   Problem Relation Age of Onset   ??? Alzheimer Mother    ??? Diabetes Mother    ??? Cancer Mother         colon cancer    ??? Hypertension Father    ??? Heart Disease Father    ??? Cancer Father         prostate, lung cancer    ??? Diabetes Sister    ??? Heart Disease Sister    ???  Diabetes Brother      Social History     Tobacco Use   ??? Smoking status: Former Smoker   ??? Smokeless tobacco: Never Used   ??? Tobacco comment: quit years ago   Substance Use Topics   ??? Alcohol use: No       Review of Systems   Constitutional: Negative for chills, fever, malaise/fatigue and weight loss.   HENT: Negative for congestion, ear discharge, ear pain, hearing loss and nosebleeds.    Eyes: Negative for blurred vision, double vision and discharge.   Respiratory: Negative for cough.    Cardiovascular: Negative for chest pain, palpitations, claudication and leg swelling.   Gastrointestinal: Positive for abdominal pain, blood in stool and melena. Negative for constipation, diarrhea, nausea and vomiting.   Genitourinary: Negative for dysuria, frequency and urgency.   Musculoskeletal: Positive for back pain. Negative for myalgias.   Skin: Negative for itching and rash.   Neurological: Negative for dizziness, tingling, sensory change, speech change, focal weakness, weakness and headaches.   Psychiatric/Behavioral: Negative for depression, hallucinations, substance abuse and suicidal ideas. The patient is nervous/anxious.        Objective:     Patient-Reported Vitals 02/23/2019   Patient-Reported Weight 125 lb   Patient-Reported Pulse 79   Patient-Reported Systolic  [id number]   Patient-Reported Diastolic 62      General: alert, cooperative, no distress   Mental  status: normal mood, behavior, speech, dress, motor activity, and thought processes, able to follow commands   HENT: NCAT   Neck: no visualized mass   Resp: no respiratory distress    Neuro: no gross deficits   Skin: no discoloration or lesions of concern on visible areas   Psychiatric: normal affect, consistent with stated mood, no evidence of hallucinations     Additional exam findings:       We discussed the expected course, resolution and complications of the diagnosis(es) in detail.  Medication risks, benefits, costs, interactions, and alternatives were discussed as indicated.  I advised him to contact the office if his condition worsens, changes or fails to improve as anticipated. He expressed understanding with the diagnosis(es) and plan.       Sherryll Burger Sr., who was evaluated through a patient-initiated, synchronous (real-time) audio-video encounter, and/or his healthcare decision maker, is aware that it is a billable service, with coverage as determined by his insurance carrier. He provided verbal consent to proceed: Yes, and patient identification was verified. It was conducted pursuant to the emergency declaration under the [place], [place] waiver authority and the [employer] Act. A caregiver was present when appropriate. Ability to conduct physical exam was limited. I was in the office. The patient was at home.      [name], MD

## 2019-04-09 NOTE — Telephone Encounter (Signed)
Please scheduel VV to discuss   Finding in X ray results sent by dr Leron Croak

## 2019-04-12 NOTE — Telephone Encounter (Signed)
[facility] called to inform us of his referral. We had referred him to them for a vascular study because of his recent xray results. She informed me that they already follow the patient's care and that he had already gotten a vascular study about a week before the xray and they came back fine. Their instructions tot the patient is to f/u in a year.

## 2019-04-12 NOTE — Telephone Encounter (Signed)
Noted  

## 2019-04-12 NOTE — Telephone Encounter (Signed)
Can you check and see if we need to see him again a referall came in thru the workque?

## 2019-04-12 NOTE — Telephone Encounter (Signed)
fyi

## 2019-04-12 NOTE — Telephone Encounter (Signed)
Called and spoke with Apolonio Schneiders at the office regarding the referral placed for pt, advised we already see the pt and the xray was done 1 week after pt being seen by [place], studies were normal from vascular standpoint and we were following the pt. Apolonio Schneiders states she will let provider know, advised the wife of pt is aware also, because she had called our office as well questioning the referral.

## 2019-04-16 ENCOUNTER — Encounter

## 2019-04-18 MED ORDER — PROPRANOLOL 20 MG TAB
20 mg | ORAL_TABLET | ORAL | 0 refills | Status: DC
Start: 2019-04-18 — End: 2020-01-28

## 2019-04-24 ENCOUNTER — Encounter

## 2019-04-24 NOTE — Telephone Encounter (Signed)
[facility] [facility] is requesting a refill on One Touch Ultra Blue Test qty 400 for 90 days

## 2019-04-25 MED ORDER — BLOOD SUGAR DIAGNOSTIC TEST STRIPS
ORAL_STRIP | 3 refills | Status: AC
Start: 2019-04-25 — End: ?

## 2019-04-30 NOTE — Telephone Encounter (Signed)
Need to stop lopid last lipid  panel was normal in 6/20and this medication can affect his liver

## 2019-05-01 ENCOUNTER — Ambulatory Visit: Admit: 2019-05-01 | Discharge: 2019-05-01 | Payer: MEDICARE | Primary: Legal Medicine

## 2019-05-01 ENCOUNTER — Inpatient Hospital Stay: Admit: 2019-05-01 | Payer: MEDICARE | Primary: Legal Medicine

## 2019-05-01 DIAGNOSIS — D469 Myelodysplastic syndrome, unspecified: Secondary | ICD-10-CM

## 2019-05-01 LAB — METABOLIC PANEL, COMPREHENSIVE
A-G Ratio: 1.3 (ref 0.8–1.7)
ALT (SGPT): 31 U/L (ref 16–61)
AST (SGOT): 35 U/L (ref 10–38)
Albumin: 4.3 g/dL (ref 3.4–5.0)
Alk. phosphatase: 227 U/L — ABNORMAL HIGH (ref 45–117)
Anion gap: 7 mmol/L (ref 3.0–18)
BUN/Creatinine ratio: 16 (ref 12–20)
BUN: 18 MG/DL (ref 7.0–18)
Bilirubin, total: 0.6 MG/DL (ref 0.2–1.0)
CO2: 23 mmol/L (ref 21–32)
Calcium: 9.9 MG/DL (ref 8.5–10.1)
Chloride: 109 mmol/L (ref 100–111)
Creatinine: 1.1 MG/DL (ref 0.6–1.3)
GFR est AA: >60 mL/min/{1.73_m2} (ref >60)
GFR est non-AA: >60 mL/min/{1.73_m2} (ref >60)
Globulin: 3.4 g/dL (ref 2.0–4.0)
Glucose: 107 mg/dL — ABNORMAL HIGH (ref 74–99)
Potassium: 4.9 mmol/L (ref 3.5–5.5)
Protein, total: 7.7 g/dL (ref 6.4–8.2)
Sodium: 139 mmol/L (ref 136–145)

## 2019-05-01 LAB — CBC WITH AUTOMATED DIFF
ABS. BASOPHILS: 0 10*3/uL (ref 0.0–0.1)
ABS. EOSINOPHILS: 0.1 10*3/uL (ref 0.0–0.4)
ABS. LYMPHOCYTES: 0.5 10*3/uL — ABNORMAL LOW (ref 0.9–3.6)
ABS. MONOCYTES: 0.2 10*3/uL (ref 0.05–1.2)
ABS. NEUTROPHILS: 3 10*3/uL (ref 1.8–8.0)
BASOPHILS: 1 % (ref 0–2)
EOSINOPHILS: 2 % (ref 0–5)
HCT: 37.8 % (ref 36.0–48.0)
HGB: 12 g/dL — ABNORMAL LOW (ref 13.0–16.0)
LYMPHOCYTES: 13 % — ABNORMAL LOW (ref 21–52)
MCH: 31.2 PG (ref 24.0–34.0)
MCHC: 31.7 g/dL (ref 31.0–37.0)
MCV: 98.2 FL — ABNORMAL HIGH (ref 74.0–97.0)
MONOCYTES: 4 % (ref 3–10)
MPV: 11.4 FL (ref 9.2–11.8)
NEUTROPHILS: 80 % — ABNORMAL HIGH (ref 40–73)
PLATELET: 103 10*3/uL — ABNORMAL LOW (ref 135–420)
RBC: 3.85 M/uL — ABNORMAL LOW (ref 4.70–5.50)
RDW: 13.6 % (ref 11.6–14.5)
WBC: 3.7 10*3/uL — ABNORMAL LOW (ref 4.6–13.2)

## 2019-05-01 LAB — IRON PROFILE
Iron % saturation: 16 % — ABNORMAL LOW (ref 20–50)
Iron: 62 ug/dL (ref 50–175)
TIBC: 397 ug/dL (ref 250–450)

## 2019-05-01 LAB — FERRITIN: Ferritin: 49 ng/mL (ref 8–388)

## 2019-05-03 NOTE — Telephone Encounter (Signed)
Wife informed

## 2019-05-08 ENCOUNTER — Ambulatory Visit: Payer: MEDICARE | Attending: Internal Medicine | Primary: Legal Medicine

## 2019-05-08 NOTE — Telephone Encounter (Signed)
Pt's spouse called pt not feeling well unable to keep appt. Labs were drawn 05/01/2019. Please advise if pt needs infusion spouse is worried as Mr. Saldutti had to have blood transfusion prior. Mrs. Loredo can be reached at [phone]

## 2019-05-10 NOTE — Telephone Encounter (Signed)
Patient's wife called, she would like a call back to go over his iron results. She was called today to schedule him a iron infusion.     Please call her at [phone]

## 2019-05-11 ENCOUNTER — Encounter

## 2019-05-17 ENCOUNTER — Telehealth: Admit: 2019-05-17 | Discharge: 2019-05-17 | Payer: MEDICARE | Attending: Legal Medicine | Primary: Legal Medicine

## 2019-05-17 DIAGNOSIS — E782 Mixed hyperlipidemia: Secondary | ICD-10-CM

## 2019-05-17 NOTE — Progress Notes (Signed)
(AWV) The Initial Medicare Annual Wellness Exam PROGRESS NOTE    This is an Initial Medicare Annual Wellness Exam (AWV) (Performed 12 months after IPPE or effective date of Medicare Part B enrollment, Once in a lifetime)    I have reviewed the patient's medical history in detail and updated the computerized patient record.     William Blalock Cortner Sr. is a 73 y.o. Caucasian male and presents for an annual wellness exam       Blood sugar reading        Fasting is 90 to 98   After eating highest 238  Once in the last 2 month but average is [place]., who was evaluated through a synchronous (real-time) audio-video encounter, and/or his healthcare decision maker, is aware that it is a billable service, with coverage as determined by his insurance carrier. He provided verbal consent to proceed: Yes, and patient identification was verified. It was conducted pursuant to the emergency declaration under the [place], [place] waiver authority and the [employer] Act. A caregiver was present when appropriate. Ability to conduct physical exam was limited. I was in the office. The patient was at home.          Patient Active Problem List   Diagnosis Code   ??? Chronic anemia D64.9   ??? Controlled type 2 diabetes mellitus without complication, without long-term current use of insulin ([place]) E11.9   ??? Essential hypertension I10   ??? Hyperlipidemia E78.5   ??? History of stroke Z86.73   ??? Anxiety F41.9   ??? Chronic pain G89.29   ??? Iron deficiency anemia D50.9   ??? 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 Other cirrhosis of liver (HCC) K74.69   ??? Depression F32.9   ??? Type II diabetes mellitus (HCC) E11.9   ??? HTN (hypertension) I10   ??? 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 Stroke (HCC) I63.9   ??? Compressed vertebrae [id number]   ??? Chronic back pain greater than 3 months duration M54.9, G89.29   ??? Hyperlipemia E78.5   ??? Anxiety F41.9    ??? 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 Iron deficiency anemia secondary to inadequate dietary iron intake D50.8   ??? 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 Thrombocytopenia ([place]) D69.6   ??? 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 Benign prostatic hyperplasia (BPH) with straining on urination N40.1, R39.16   ??? 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 Dermatitis L30.9   ??? Migraine without aura and without status migrainosus, not intractable G43.009   ??? Restless leg syndrome G25.81   ??? Peripheral vascular disease (HCC) I73.9   ??? Varices of esophagus determined by endoscopy [facility]) I85.00     Patient Active Problem List    Diagnosis Date Noted   ??? Varices of esophagus determined by endoscopy ([place]) 03/15/2019   ??? Peripheral vascular disease ([place]) 02/21/2018   ??? Restless leg syndrome 12/04/2017   ??? Migraine without aura and without status migrainosus, not intractable 04/15/2017   ??? 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 Dermatitis 01/05/2017   ??? Benign prostatic hyperplasia (BPH) with straining on urination 12/31/2016   ??? 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 Myelodysplastic syndrome ([place]) 12/22/2016   ??? Type 2 diabetes with  nephropathy ([place]) 10/18/2016   ??? Iron deficiency anemia secondary to inadequate dietary iron intake 09/23/2016   ??? Chronic leukopenia 09/23/2016   ??? Thrombocytopenia ([place]) 09/23/2016   ??? Moderate major depression ([place]) 09/10/2016   ??? Type II diabetes mellitus ([place]) 08/03/2016   ??? HTN (hypertension) 08/03/2016   ??? 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 Stroke ([place]) 08/03/2016   ??? Compressed vertebrae 08/03/2016   ??? Chronic back pain greater than 3 months duration 08/03/2016   ??? Hyperlipemia 08/03/2016   ??? Anxiety 08/03/2016   ??? Depression 06/30/2016   ??? Iron deficiency anemia 04/22/2016   ??? NAFLD (nonalcoholic fatty liver disease) 04/22/2016    ??? Other cirrhosis of liver ([place]) 04/22/2016   ??? Chronic anemia 03/18/2016   ??? Controlled type 2 diabetes mellitus without complication, without long-term current use of insulin ([place]) 03/18/2016   ??? Essential hypertension 03/18/2016   ??? Hyperlipidemia 03/18/2016   ??? History of stroke 03/18/2016   ??? Anxiety 03/18/2016   ??? Chronic pain 03/18/2016     Current Outpatient Medications   Medication Sig Dispense Refill   ??? glucose blood VI test strips (ASCENSIA AUTODISC VI, ONE TOUCH ULTRA TEST VI) strip Use to test blood sugars four times daily with Onetouch ultra 2. 400 Strip 3   ??? propranoloL (INDERAL) 20 mg tablet TAKE 1 TABLET BY MOUTH TWICE DAILY 180 Tab 0   ??? lancets (OneTouch UltraSoft Lancets) misc USE TO TEST BLOOD GLUCOSE FOUR TIMES DAILY 100 Each 5   ??? finasteride (PROSCAR) 5 mg tablet TAKE 1 TABLET BY MOUTH DAILY 90 Tab 0   ??? topiramate (TOPAMAX) 100 mg tablet TAKE 1 TABLET BY MOUTH DAILY 90 Tab 0   ??? prochlorperazine (COMPAZINE) 10 mg tablet Take 1 Tab by mouth every six (6) hours as needed for Nausea or Vomiting. 30 Tab 1   ??? LORazepam (ATIVAN) 1 mg tablet Take 1 Tab by mouth daily for 90 days. Max Daily Amount: 1 mg. 90 Tab 0   ??? metFORMIN (GLUCOPHAGE) 500 mg tablet TAKE 2 TABLETS BY MOUTH TWICE DAILY WITH MEALS 360 Tab 0   ??? sucralfate (CARAFATE) 1 gram tablet TAKE 1 TABLET BY MOUTH FOUR TIMES DAILY AT BEDTIME WITH MEALS 360 Tab 0   ??? venlafaxine-SR (Effexor XR) 150 mg capsule Take 150 mg by mouth daily.     ??? rosuvastatin (CRESTOR) 10 mg tablet TAKE 1 TABLET BY MOUTH NIGHTLY 90 Tab 1   ??? aspirin (ASPIRIN) 325 mg tablet Take 325 mg by mouth daily.     ??? HumuLIN R Regular U-100 Insuln 100 unit/mL injection INJECT AS NEEDED PER SLIDING SCALE (Patient taking differently: INJECT AS NEEDED PER SLIDING SCALE Max dose 20 units daily) 10 mL 3   ??? Insulin Syringe-Needle U-100 (BD INSULIN SYRINGE ULTRA-FINE) 0.3 mL 31 gauge x 5/16" syrg Use 4 times daily to inject insulin 400 Syringe 1    ??? Insulin Needles, Disposable, (BD ULTRA-FINE SHORT PEN NEEDLE) 31 gauge x 5/16" ndle Use 4 times daily to inject insulin. 1 Package 11   ??? Blood-Glucose Meter (ONETOUCH ULTRA2 METER) misc Use to check blood sugar BID 1 Each 0   ??? esomeprazole (NEXIUM) 20 mg capsule Take  by mouth daily.     ??? metoclopramide HCl (REGLAN) 10 mg tablet Take 10 mg by mouth Before breakfast, lunch, dinner and at bedtime.     ??? pramipexole (MIRAPEX) 0.125 mg tablet TAKE 1 TABLET BY MOUTH NIGHTLY 30 Tab 0   ??? oxyCODONE  IR (ROXICODONE) 10 mg tab immediate release tablet Take 10 mg by mouth every six (6) hours as needed for Pain.     ??? Insulin Syringe-Needle U-100 1 mL 29 gauge x 1/2" syrg Three times per day and as needed as per sliding scale 100 Syringe 6   ??? naloxone (NARCAN) 4 mg/actuation nasal spray Use 1 spray intranasally into 1 nostril. Use a new Narcan nasal spray for subsequent doses and administer into alternating nostrils. May repeat every 2 to 3 minutes as needed for opioid overdose symptoms. 1 Each 0   ??? cholecalciferol, vitamin D3, (VITAMIN D3) 2,000 unit tab Take  by mouth.     ??? cyclobenzaprine (FLEXERIL) 10 mg tablet Take  by mouth three (3) times daily as needed for Muscle Spasm(s).       Allergies   Allergen Reactions   ??? Doxycycline Hives   ??? Doxycycline Rash   ??? Gabapentin Other (comments)     Per pt became violent    ??? Gabapentin Other (comments)     Slurred speech   ??? Lyrica [Pregabalin] Other (comments)     Per pt stroke symptoms   ??? Lyrica [Pregabalin] Anxiety   ??? Penicillin G Rash   ??? Penicillins Hives     Past Medical History:   Diagnosis Date   ??? Anemia    ??? Anxiety    ??? Chronic pain    ??? Cirrhosis of liver ([place])    ??? Diabetes ([place])    ??? GERD (gastroesophageal reflux disease)    ??? Hyperlipemia    ??? Hypertension    ??? Hypotension    ??? Migraine    ??? Other cirrhosis of liver ([place])    ??? Stroke [facility])     had 3 strokes    ??? Thrombocytopenia ([place])      Past Surgical History:   Procedure Laterality Date    ??? HX CHOLECYSTECTOMY     ??? HX ORTHOPAEDIC      R hand sx   ??? HX OTHER SURGICAL      Hand surgery- Nail gun went through finger     Family History   Problem Relation Age of Onset   ??? Alzheimer Mother    ??? Diabetes Mother    ??? Cancer Mother         colon cancer    ??? Hypertension Father    ??? Heart Disease Father    ??? Cancer Father         prostate, lung cancer    ??? Diabetes Sister    ??? Heart Disease Sister    ??? Diabetes Brother      Social History     Tobacco Use   ??? Smoking status: Former Smoker   ??? Smokeless tobacco: Never Used   ??? Tobacco comment: quit years ago   Substance Use Topics   ??? Alcohol use: No       ROS   Negative except  decreas appetide and weight loss wt 126 lb   History obtained from the patient and his wife   General ROS: positive for  - fatigue and malaise  Psychological ROS: negative for - anxiety or depression  Ophthalmic ROS: positive  for - blurry vision or decreased vision  ENT ROS: positive for - epistaxis      Hematological and Lymphatic ROS: positive for - iron infusion       Respiratory ROS: no cough, shortness of breath, or wheezing  Cardiovascular ROS: no chest  pain or dyspnea on exertion  Gastrointestinal ROS: positive for - abdominal pain, appetite loss and gas/bloating  Genito-Urinary ROS: no dysuria, trouble voiding, or hematuria  Musculoskeletal ROS: positive for - joint pain and muscle pain  Neurological ROS: no TIA or stroke symptoms  Dermatological ROS: negative for - eczema, pruritus or rash    All other systems reviewed and are negative.    History     Past Medical History:   Diagnosis Date   ??? Anemia    ??? Anxiety    ??? Chronic pain    ??? Cirrhosis of liver ([place])    ??? Diabetes ([place])    ??? GERD (gastroesophageal reflux disease)    ??? Hyperlipemia    ??? Hypertension    ??? Hypotension    ??? Migraine    ??? Other cirrhosis of liver ([place])    ??? Stroke [facility])     had 3 strokes    ??? Thrombocytopenia ([place])       Past Surgical History:   Procedure Laterality Date   ??? HX CHOLECYSTECTOMY      ??? HX ORTHOPAEDIC      R hand sx   ??? HX OTHER SURGICAL      Hand surgery- Nail gun went through finger     Current Outpatient Medications   Medication Sig Dispense Refill   ??? glucose blood VI test strips (ASCENSIA AUTODISC VI, ONE TOUCH ULTRA TEST VI) strip Use to test blood sugars four times daily with Onetouch ultra 2. 400 Strip 3   ??? propranoloL (INDERAL) 20 mg tablet TAKE 1 TABLET BY MOUTH TWICE DAILY 180 Tab 0   ??? lancets (OneTouch UltraSoft Lancets) misc USE TO TEST BLOOD GLUCOSE FOUR TIMES DAILY 100 Each 5   ??? finasteride (PROSCAR) 5 mg tablet TAKE 1 TABLET BY MOUTH DAILY 90 Tab 0   ??? topiramate (TOPAMAX) 100 mg tablet TAKE 1 TABLET BY MOUTH DAILY 90 Tab 0   ??? prochlorperazine (COMPAZINE) 10 mg tablet Take 1 Tab by mouth every six (6) hours as needed for Nausea or Vomiting. 30 Tab 1   ??? LORazepam (ATIVAN) 1 mg tablet Take 1 Tab by mouth daily for 90 days. Max Daily Amount: 1 mg. 90 Tab 0   ??? metFORMIN (GLUCOPHAGE) 500 mg tablet TAKE 2 TABLETS BY MOUTH TWICE DAILY WITH MEALS 360 Tab 0   ??? sucralfate (CARAFATE) 1 gram tablet TAKE 1 TABLET BY MOUTH FOUR TIMES DAILY AT BEDTIME WITH MEALS 360 Tab 0   ??? venlafaxine-SR (Effexor XR) 150 mg capsule Take 150 mg by mouth daily.     ??? rosuvastatin (CRESTOR) 10 mg tablet TAKE 1 TABLET BY MOUTH NIGHTLY 90 Tab 1   ??? aspirin (ASPIRIN) 325 mg tablet Take 325 mg by mouth daily.     ??? HumuLIN R Regular U-100 Insuln 100 unit/mL injection INJECT AS NEEDED PER SLIDING SCALE (Patient taking differently: INJECT AS NEEDED PER SLIDING SCALE Max dose 20 units daily) 10 mL 3   ??? Insulin Syringe-Needle U-100 (BD INSULIN SYRINGE ULTRA-FINE) 0.3 mL 31 gauge x 5/16" syrg Use 4 times daily to inject insulin 400 Syringe 1   ??? Insulin Needles, Disposable, (BD ULTRA-FINE SHORT PEN NEEDLE) 31 gauge x 5/16" ndle Use 4 times daily to inject insulin. 1 Package 11   ??? Blood-Glucose Meter (ONETOUCH ULTRA2 METER) misc Use to check blood sugar BID 1 Each 0    ??? esomeprazole (NEXIUM) 20 mg capsule Take  by mouth daily.     ???  metoclopramide HCl (REGLAN) 10 mg tablet Take 10 mg by mouth Before breakfast, lunch, dinner and at bedtime.     ??? pramipexole (MIRAPEX) 0.125 mg tablet TAKE 1 TABLET BY MOUTH NIGHTLY 30 Tab 0   ??? oxyCODONE IR (ROXICODONE) 10 mg tab immediate release tablet Take 10 mg by mouth every six (6) hours as needed for Pain.     ??? Insulin Syringe-Needle U-100 1 mL 29 gauge x 1/2" syrg Three times per day and as needed as per sliding scale 100 Syringe 6   ??? naloxone (NARCAN) 4 mg/actuation nasal spray Use 1 spray intranasally into 1 nostril. Use a new Narcan nasal spray for subsequent doses and administer into alternating nostrils. May repeat every 2 to 3 minutes as needed for opioid overdose symptoms. 1 Each 0   ??? cholecalciferol, vitamin D3, (VITAMIN D3) 2,000 unit tab Take  by mouth.     ??? cyclobenzaprine (FLEXERIL) 10 mg tablet Take  by mouth three (3) times daily as needed for Muscle Spasm(s).       Allergies   Allergen Reactions   ??? Doxycycline Hives   ??? Doxycycline Rash   ??? Gabapentin Other (comments)     Per pt became violent    ??? Gabapentin Other (comments)     Slurred speech   ??? Lyrica [Pregabalin] Other (comments)     Per pt stroke symptoms   ??? Lyrica [Pregabalin] Anxiety   ??? Penicillin G Rash   ??? Penicillins Hives     Family History   Problem Relation Age of Onset   ??? Alzheimer Mother    ??? Diabetes Mother    ??? Cancer Mother         colon cancer    ??? Hypertension Father    ??? Heart Disease Father    ??? Cancer Father         prostate, lung cancer    ??? Diabetes Sister    ??? Heart Disease Sister    ??? Diabetes Brother      Social History     Tobacco Use   ??? Smoking status: Former Smoker   ??? Smokeless tobacco: Never Used   ??? Tobacco comment: quit years ago   Substance Use Topics   ??? Alcohol use: No     Patient Active Problem List   Diagnosis Code   ??? Chronic anemia D64.9   ??? Controlled type 2 diabetes mellitus without complication, without  long-term current use of insulin ([place]) E11.9   ??? Essential hypertension I10   ??? Hyperlipidemia E78.5   ??? History of stroke Z86.73   ??? Anxiety F41.9   ??? Chronic pain G89.29   ??? Iron deficiency anemia D50.9   ??? 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 Other cirrhosis of liver (HCC) K74.69   ??? Depression F32.9   ??? Type II diabetes mellitus (HCC) E11.9   ??? HTN (hypertension) I10   ??? 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 Stroke (HCC) I63.9   ??? Compressed vertebrae [id number]   ??? Chronic back pain greater than 3 months duration M54.9, G89.29   ??? Hyperlipemia E78.5   ??? Anxiety F41.9   ??? 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 Iron deficiency anemia secondary to inadequate dietary iron intake D50.8   ??? 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 Thrombocytopenia ([place]) D69.6   ??? 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 Myelodysplastic  syndrome ([place]) D46.9   ??? Benign prostatic hyperplasia (BPH) with straining on urination N40.1, R39.16   ??? 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 Dermatitis L30.9   ??? Migraine without aura and without status migrainosus, not intractable G43.009   ??? Restless leg syndrome G25.81   ??? Peripheral vascular disease (HCC) I73.9   ??? Varices of esophagus determined by endoscopy ([place]) I85.00       Health Maintenance History  Immunizations reviewed, dtap  , zoster  Can get it at the pharmacy   Colonoscopy: he is up to date ,   Chest CT: Quit 25 years ago   Eye exam: need to schedule appointment       Depression Risk Factor Screening:      Patient Health Questionnaire (PHQ-2)   Over the last 2 weeks, how often have you been bothered by any of the following problems?  ?? Little interest or pleasure in doing things?  ?? Not at all. [0]  ?? Feeling down, depressed, or hopeless?   ?? Not at all. [0]    Total Score: 0/6  PHQ-2 Assessment Scoring:   A score of 2 or more requires further screening with the PHQ-9    Alcohol Risk Factor Screening:      Women: On any occasion during the past 3 months, have you had more than 3 drinks containing alcohol?   Do you average more than 7 drinks per week?  Men: On any occasion during the past 3 months, have you had more than 4 drinks containing alcohol? no  Do you average more than 14 drinks per week? No     Functional Ability and Level of Safety:     Hearing Loss    Hearing is good.    Activities of Daily Living   Self-care.   Requires assistance with: no ADLs    Fall Risk   No fall risk factors    Abuse Screen   Patient is not abused  None    Examination   Physical Examination  There were no vitals filed for this visit.   There is no height or weight on file to calculate BMI.     Evaluation of Cognitive Function:  Mood/affect:good   Appearance:well groomed   Family member/caregiver input: he does not need help with ADL ,he drives and manage his own money    alert, well appearing, and in no distress and oriented to person, place, and time    Patient Care Team:  [name], MD as PCP - General (Internal Medicine)  [name], MD as PCP - [facility] Empaneled Provider  Suzy Bouchard, MD as Consulting Provider (Oncology)  Levie Heritage, MD as Consulting Provider (Physical Medicine and Rehabilitation)  Samuel Bouche, MD as Consulting Provider (Dermatology)  Moshe Salisbury, MD (Oral Surgery)  Barot, Hollace Kinnier, MD (Neurology)  Tamera Reason, MD (Ophthalmology)  Loyal Buba, MD (Gastroenterology)  [name], MD (Vascular Surgery)  [name], MD (Vascular Surgery)    End-of-life planning  Advanced Directive in the case than an injury or illness causes the patient to be unable to make health care decisions    Health Care Directive or Living Will: no    He will fill paperwork that was given to him last year and bring it to the office  I discussed with him that he need to talk to his children about this,h agreed and verbalized understandinge .     Advice/Referrals/Counselling/Plan:    Education  and counseling provided:  Are appropriate based on today's review and evaluation  Include in education list (weight loss, physical activity, smoking cessation, fall prevention, and nutrition)    ICD-10-CM ICD-9-CM    1. Mixed hyperlipidemia  E78.2 272.2    2. Essential hypertension  I10 401.9    3. Migraine without aura and without status migrainosus, not intractable  G43.009 346.10    4. NAFLD (nonalcoholic fatty liver disease)  K76.0 571.8    5. Restless leg syndrome  G25.81 333.94    6. Controlled type 2 diabetes mellitus without complication, without long-term current use of insulin (HCC)  E11.9 250.00    7. Medicare annual wellness visit, subsequent  Z00.00 V70.0      Encounter Diagnoses   Name Primary?   ??? Mixed hyperlipidemia Yes   ??? Essential hypertension    ??? Migraine without aura and without status migrainosus, not intractable    ??? NAFLD (nonalcoholic fatty liver disease)    ??? Restless leg syndrome    ??? Controlled type 2 diabetes mellitus without complication, without long-term current use of insulin ([place])    ??? Medicare annual wellness visit, subsequent      No orders of the defined types were placed in this encounter.    No orders of the defined types were placed in this encounter.    No orders of the defined types were placed in this encounter.    Diagnoses and all orders for this visit:    1. Mixed hyperlipidemia    2. Essential hypertension    3. Migraine without aura and without status migrainosus, not intractable    4. NAFLD (nonalcoholic fatty liver disease)    5. Restless leg syndrome    6. Controlled type 2 diabetes mellitus without complication, without long-term current use of insulin ([place])    7. Medicare annual wellness visit, subsequent          current treatment plan is effective, no change in therapy.  Brief written plan, checklist    I have discussed the diagnosis with the patient and the intended plan as seen in the above orders.  The patient has received an after-visit summary  and questions were answered concerning future plans.  I have discussed medication side effects and warnings with the patient as well.I have reviewed the plan of care with the patient, accepted their input and they are in agreement with the treatment goals.               ____________________________________________________________    Problem Assessment    for treatment of   Chief Complaint   Patient presents with   ??? Annual Wellness Visit     [place]

## 2019-05-17 NOTE — Progress Notes (Signed)
William Blalock Rung Sr. is a 73 y.o. male (DOB: Sep 19, 1945) presenting to address:    Chief Complaint   Patient presents with   ??? Annual Wellness Visit     [place]       There were no vitals filed for this visit.    Hearing/Vision:   No exam data present    Learning Assessment:     Learning Assessment 02/21/2018   PRIMARY LEARNER Patient   HIGHEST LEVEL OF EDUCATION - PRIMARY LEARNER  -   BARRIERS PRIMARY LEARNER -   CO-LEARNER CAREGIVER -   PRIMARY LANGUAGE ENGLISH   LEARNER PREFERENCE PRIMARY READING     LISTENING     VIDEOS   ANSWERED BY patient   RELATIONSHIP SELF     Depression Screening:     3 most recent PHQ Screens 05/17/2019   Little interest or pleasure in doing things Not at all   Feeling down, depressed, irritable, or hopeless Not at all   Total Score PHQ 2 0     Fall Risk Assessment:     Fall Risk Assessment, last 12 mths 05/17/2019   Able to walk? Yes   Fall in past 12 months? No   Fall with injury? -   Number of falls in past 12 months -   Fall Risk Score -     Abuse Screening:     Abuse Screening Questionnaire 05/17/2019   Do you ever feel afraid of your partner? N   Are you in a relationship with someone who physically or mentally threatens you? N   Is it safe for you to go home? Y     Coordination of Care Questionaire:   1. Have you been to the ER, urgent care clinic since your last visit?  Hospitalized since your last visit? NO    2. Have you seen or consulted any other health care providers outside of the [facility] since your last visit?  Include any pap smears or colon screening. YES, Dr. Vilinda Blanks Surgeon    Advanced Directive:   1. Do you have an Advanced Directive? NO    2. Would you like information on Advanced Directives? NO

## 2019-05-18 ENCOUNTER — Inpatient Hospital Stay: Admit: 2019-05-18 | Payer: MEDICARE | Primary: Legal Medicine

## 2019-05-18 ENCOUNTER — Encounter

## 2019-05-18 DIAGNOSIS — D464 Refractory anemia, unspecified: Secondary | ICD-10-CM

## 2019-05-18 MED ORDER — SODIUM CHLORIDE 0.9 % IV
INTRAVENOUS | Status: DC
Start: 2019-05-18 — End: 2019-05-19

## 2019-05-18 MED ORDER — SODIUM CHLORIDE 0.9 % IJ SYRG
INTRAMUSCULAR | Status: DC | PRN
Start: 2019-05-18 — End: 2019-05-19
  Administered 2019-05-18: 17:00:00 via INTRAVENOUS

## 2019-05-18 MED ORDER — FERRIC CARBOXYMALTOSE 50 MG IRON/ML INTRAVENOUS SOLUTION
50 iron mg/mL | Freq: Once | INTRAVENOUS | Status: AC
Start: 2019-05-18 — End: 2019-05-18
  Administered 2019-05-18: 18:00:00 via INTRAVENOUS

## 2019-05-18 MED FILL — INJECTAFER 50 MG IRON/ML INTRAVENOUS SOLUTION: 50 mg iron/mL | INTRAVENOUS | Qty: 15

## 2019-05-18 MED FILL — BD POSIFLUSH NORMAL SALINE 0.9 % INJECTION SYRINGE: INTRAMUSCULAR | Qty: 10

## 2019-05-18 MED FILL — SODIUM CHLORIDE 0.9 % IV: INTRAVENOUS | Qty: 1000

## 2019-05-18 NOTE — Progress Notes (Signed)
[facility] Progress Note    Date: May 18, 2019    Name: Rainen Wehrman [facility] Sr.    MRN: [id number]         DOB: Oct 19, 1945    Injectafer 1/2    Mr. Ptacek was assessed and education was provided. Pt has had Injectafer in the past and tolerated it with no complaints.    Mr. Lunder vitals were reviewed and patient was observed for 5 minutes prior to treatment.   Visit Vitals  BP 112/61 (BP 1 Location: Left arm, BP Patient Position: Sitting)   Pulse 63   Temp 98.6 ??F (37 ??C)   Resp 16   SpO2 100%   IVP started in rt forearm with 22 g catheter X 2s attempts, one by Probation officer and then Gildardo Griffes, R.N., brisk blood return and flushes well.    Injectafer 750 mg IV given over 8-9 minutes.    Mr. Saucer tolerated the infusion, and had no complaints.  Patient armband removed and shredded.    Mr. Zambo was discharged from [facility] in stable condition at 1340. He is to return on 05/28/2019 at 1300 for his next appointment for Injectafer dose #2 of 2.    Bjorn Pippin, RN  May 18, 2019  1:55 PM

## 2019-05-25 ENCOUNTER — Encounter

## 2019-05-28 ENCOUNTER — Inpatient Hospital Stay: Admit: 2019-05-28 | Payer: MEDICARE | Primary: Legal Medicine

## 2019-05-28 DIAGNOSIS — D464 Refractory anemia, unspecified: Secondary | ICD-10-CM

## 2019-05-28 MED ORDER — SODIUM CHLORIDE 0.9 % IJ SYRG
INTRAMUSCULAR | Status: DC | PRN
Start: 2019-05-28 — End: 2019-05-29
  Administered 2019-05-28 (×2): via INTRAVENOUS

## 2019-05-28 MED ORDER — FERRIC CARBOXYMALTOSE 50 MG IRON/ML INTRAVENOUS SOLUTION
50 iron mg/mL | Freq: Once | INTRAVENOUS | Status: AC
Start: 2019-05-28 — End: 2019-05-28
  Administered 2019-05-28: 18:00:00 via INTRAVENOUS

## 2019-05-28 MED ORDER — SODIUM CHLORIDE 0.9 % IV
INTRAVENOUS | Status: DC
Start: 2019-05-28 — End: 2019-05-29
  Administered 2019-05-28: 18:00:00 via INTRAVENOUS

## 2019-05-28 MED FILL — SODIUM CHLORIDE 0.9 % IV: INTRAVENOUS | Qty: 1000

## 2019-05-28 MED FILL — INJECTAFER 50 MG IRON/ML INTRAVENOUS SOLUTION: 50 mg iron/mL | INTRAVENOUS | Qty: 15

## 2019-05-28 MED FILL — BD POSIFLUSH NORMAL SALINE 0.9 % INJECTION SYRINGE: INTRAMUSCULAR | Qty: 10

## 2019-05-28 NOTE — Telephone Encounter (Signed)
Requested Prescriptions     Pending Prescriptions Disp Refills   ??? sucralfate (CARAFATE) 1 gram tablet 360 Tab 0     Pt is currently out of emergency pills that he got from the pharmacy last week. His last pill is today

## 2019-05-28 NOTE — Progress Notes (Signed)
[facility] Progress Note    Date: May 28, 2019    Name: Dorn Karp [facility] Sr.    MRN: [id number]         DOB: [birth year]    Injectafer 1/2    Mr. Kiser was assessed and education was provided. Pt has had Injectafer in the past and tolerated it with no complaints.    Mr. Shingleton vitals were reviewed and patient was observed for 5 minutes prior to treatment.   Visit Vitals  BP 107/62 (BP 1 Location: Left arm, BP Patient Position: At rest;Sitting)   Pulse 73   Temp 97.3 ??F (36.3 ??C)   Resp 16   SpO2 100%       Injectafer 750 mg IV mixed with 250 ml NS given over approx 20 min via peripheral line.    Mr. Schwenke tolerated the infusion, and had no complaints.  Patient armband removed and shredded.    Mr. Gotte was discharged from [place] in stable condition at 1405. He has no more OPIC appts.    Lynnda Shields, RN  May 28, 2019

## 2019-05-29 ENCOUNTER — Encounter

## 2019-05-29 NOTE — Telephone Encounter (Signed)
Requested Prescriptions      No prescriptions requested or ordered in this encounter

## 2019-05-29 NOTE — Telephone Encounter (Signed)
Patient's wife called stating that the patient is completely out of medication and sent the original request last week. Please advise

## 2019-05-31 MED ORDER — SUCRALFATE 1 GRAM TAB
1 gram | ORAL_TABLET | ORAL | 0 refills | Status: DC
Start: 2019-05-31 — End: 2019-10-01

## 2019-06-06 NOTE — Telephone Encounter (Signed)
[facility] is requesting a refill on Metformin 500 mg tablets qty of 360 last refill was 03/09/2019.  No future appointments.

## 2019-06-07 NOTE — Telephone Encounter (Signed)
Need to get recent HBA1c  To decide if he needs to take metformin or not

## 2019-06-08 NOTE — Telephone Encounter (Signed)
LM on patient's answering machine to call office to schedule lab appointment for a Hemoglobin A1c to be done

## 2019-06-11 ENCOUNTER — Ambulatory Visit: Admit: 2019-06-11 | Discharge: 2019-06-11 | Payer: MEDICARE | Primary: Legal Medicine

## 2019-06-11 DIAGNOSIS — E118 Type 2 diabetes mellitus with unspecified complications: Secondary | ICD-10-CM

## 2019-06-12 ENCOUNTER — Inpatient Hospital Stay: Admit: 2019-06-12 | Payer: MEDICARE | Primary: Legal Medicine

## 2019-06-12 LAB — METABOLIC PANEL, COMPREHENSIVE
A-G Ratio: 1.2 (ref 0.8–1.7)
ALT (SGPT): 36 U/L (ref 16–61)
AST (SGOT): 37 U/L (ref 10–38)
Albumin: 4.2 g/dL (ref 3.4–5.0)
Alk. phosphatase: 260 U/L — ABNORMAL HIGH (ref 45–117)
Anion gap: 8 mmol/L (ref 3.0–18)
BUN/Creatinine ratio: 11 — ABNORMAL LOW (ref 12–20)
BUN: 9 MG/DL (ref 7.0–18)
Bilirubin, total: 0.7 MG/DL (ref 0.2–1.0)
CO2: 23 mmol/L (ref 21–32)
Calcium: 9.3 MG/DL (ref 8.5–10.1)
Chloride: 110 mmol/L (ref 100–111)
Creatinine: 0.84 MG/DL (ref 0.6–1.3)
GFR est AA: >60 mL/min/{1.73_m2} (ref >60)
GFR est non-AA: >60 mL/min/{1.73_m2} (ref >60)
Globulin: 3.5 g/dL (ref 2.0–4.0)
Glucose: 87 mg/dL (ref 74–99)
Potassium: 4.8 mmol/L (ref 3.5–5.5)
Protein, total: 7.7 g/dL (ref 6.4–8.2)
Sodium: 141 mmol/L (ref 136–145)

## 2019-06-12 LAB — CBC WITH AUTOMATED DIFF
ABS. BASOPHILS: 0 10*3/uL (ref 0.0–0.1)
ABS. EOSINOPHILS: 0.1 10*3/uL (ref 0.0–0.4)
ABS. LYMPHOCYTES: 0.4 10*3/uL — ABNORMAL LOW (ref 0.9–3.6)
ABS. MONOCYTES: 0.5 10*3/uL (ref 0.05–1.2)
ABS. NEUTROPHILS: 3.7 10*3/uL (ref 1.8–8.0)
BASOPHILS: 0 % (ref 0–2)
EOSINOPHILS: 2 % (ref 0–5)
HCT: 40 % (ref 36.0–48.0)
HGB: 12.9 g/dL — ABNORMAL LOW (ref 13.0–16.0)
LYMPHOCYTES: 9 % — ABNORMAL LOW (ref 21–52)
MCH: 32.2 PG (ref 24.0–34.0)
MCHC: 32.3 g/dL (ref 31.0–37.0)
MCV: 99.8 FL — ABNORMAL HIGH (ref 74.0–97.0)
MONOCYTES: 10 % (ref 3–10)
MPV: 10.7 FL (ref 9.2–11.8)
NEUTROPHILS: 79 % — ABNORMAL HIGH (ref 40–73)
PLATELET: 105 10*3/uL — ABNORMAL LOW (ref 135–420)
RBC: 4.01 M/uL — ABNORMAL LOW (ref 4.70–5.50)
RDW: 16.8 % — ABNORMAL HIGH (ref 11.6–14.5)
WBC: 4.8 10*3/uL (ref 4.6–13.2)

## 2019-06-12 LAB — HEMOGLOBIN A1C W/O EAG: Hemoglobin A1c: 5.4 % (ref 4.2–5.6)

## 2019-06-13 NOTE — Progress Notes (Signed)
Please ask patient for blood sugar level any hypoglycemia ?HB a1c is well controlled  Considering decrease metformin to 500 twice daily

## 2019-06-13 NOTE — Progress Notes (Signed)
Wife informed of results and advised to cut back on metformin

## 2019-06-18 ENCOUNTER — Encounter

## 2019-06-25 MED ORDER — FINASTERIDE 5 MG TAB
5 mg | ORAL_TABLET | ORAL | 0 refills | Status: DC
Start: 2019-06-25 — End: 2019-10-01

## 2019-06-28 ENCOUNTER — Telehealth

## 2019-06-28 NOTE — Telephone Encounter (Signed)
Wife would like to know why her husband medication discontinued. GEMFIBROZIL

## 2019-06-28 NOTE — Telephone Encounter (Signed)
LM to call office

## 2019-06-28 NOTE — Telephone Encounter (Signed)
Patient wife called stating that the patient has been taking gemfibrozil for a while and states that dr. Phineas Douglas stopped the medication and she would like to know whats going on. Please advise.

## 2019-06-28 NOTE — Telephone Encounter (Signed)
Will hold it till repeat lipid profile he might not need to take it it does have interaction with Lipitor

## 2019-07-02 ENCOUNTER — Encounter

## 2019-07-02 MED ORDER — TOPIRAMATE 100 MG TAB
100 mg | ORAL_TABLET | ORAL | 1 refills | Status: DC
Start: 2019-07-02 — End: 2020-01-18

## 2019-07-02 NOTE — Telephone Encounter (Signed)
Wife advised and voiced understanding

## 2019-07-04 ENCOUNTER — Inpatient Hospital Stay: Admit: 2019-07-04 | Payer: MEDICARE | Primary: Legal Medicine

## 2019-07-04 ENCOUNTER — Other Ambulatory Visit: Admit: 2019-07-04 | Discharge: 2019-07-04 | Payer: MEDICARE | Primary: Legal Medicine

## 2019-07-04 DIAGNOSIS — D469 Myelodysplastic syndrome, unspecified: Secondary | ICD-10-CM

## 2019-07-05 LAB — CBC WITH AUTOMATED DIFF
ABS. BASOPHILS: 0 10*3/uL (ref 0.0–0.1)
ABS. EOSINOPHILS: 0.1 10*3/uL (ref 0.0–0.4)
ABS. LYMPHOCYTES: 0.4 10*3/uL — ABNORMAL LOW (ref 0.9–3.6)
ABS. MONOCYTES: 0.3 10*3/uL (ref 0.05–1.2)
ABS. NEUTROPHILS: 3.6 10*3/uL (ref 1.8–8.0)
BASOPHILS: 0 % (ref 0–2)
EOSINOPHILS: 3 % (ref 0–5)
HCT: 43.8 % (ref 36.0–48.0)
HGB: 14 g/dL (ref 13.0–16.0)
LYMPHOCYTES: 9 % — ABNORMAL LOW (ref 21–52)
MCH: 32.2 PG (ref 24.0–34.0)
MCHC: 32 g/dL (ref 31.0–37.0)
MCV: 100.7 FL — ABNORMAL HIGH (ref 74.0–97.0)
MONOCYTES: 6 % (ref 3–10)
MPV: 11.4 FL (ref 9.2–11.8)
NEUTROPHILS: 82 % — ABNORMAL HIGH (ref 40–73)
PLATELET: 99 10*3/uL — ABNORMAL LOW (ref 135–420)
RBC: 4.35 M/uL — ABNORMAL LOW (ref 4.70–5.50)
RDW: 16.5 % — ABNORMAL HIGH (ref 11.6–14.5)
WBC: 4.5 10*3/uL — ABNORMAL LOW (ref 4.6–13.2)

## 2019-07-05 LAB — METABOLIC PANEL, COMPREHENSIVE
A-G Ratio: 1.2 (ref 0.8–1.7)
ALT (SGPT): 62 U/L — ABNORMAL HIGH (ref 16–61)
AST (SGOT): 74 U/L — ABNORMAL HIGH (ref 10–38)
Albumin: 4.4 g/dL (ref 3.4–5.0)
Alk. phosphatase: 338 U/L — ABNORMAL HIGH (ref 45–117)
Anion gap: 10 mmol/L (ref 3.0–18)
BUN/Creatinine ratio: 15 (ref 12–20)
BUN: 14 MG/DL (ref 7.0–18)
Bilirubin, total: 0.9 MG/DL (ref 0.2–1.0)
CO2: 20 mmol/L — ABNORMAL LOW (ref 21–32)
Calcium: 9.7 MG/DL (ref 8.5–10.1)
Chloride: 108 mmol/L (ref 100–111)
Creatinine: 0.93 MG/DL (ref 0.6–1.3)
GFR est AA: >60 mL/min/{1.73_m2} (ref >60)
GFR est non-AA: >60 mL/min/{1.73_m2} (ref >60)
Globulin: 3.6 g/dL (ref 2.0–4.0)
Glucose: 125 mg/dL — ABNORMAL HIGH (ref 74–99)
Potassium: 4.9 mmol/L (ref 3.5–5.5)
Protein, total: 8 g/dL (ref 6.4–8.2)
Sodium: 138 mmol/L (ref 136–145)

## 2019-07-05 LAB — IRON PROFILE
Iron % saturation: 20 % (ref 20–50)
Iron: 73 ug/dL (ref 50–175)
TIBC: 365 ug/dL (ref 250–450)

## 2019-07-05 LAB — FERRITIN: Ferritin: 462 NG/ML — ABNORMAL HIGH (ref 8–388)

## 2019-07-09 NOTE — Telephone Encounter (Signed)
Verified with wife that Patient is taking at 1000mg  1 500mg  tab in the morning and one at night

## 2019-07-09 NOTE — Telephone Encounter (Signed)
Patients wife called to state that he is taking 2 metformin a day to keep his sugar under control.

## 2019-07-10 NOTE — Telephone Encounter (Signed)
Noted  

## 2019-07-11 ENCOUNTER — Encounter

## 2019-07-13 NOTE — Telephone Encounter (Signed)
Need visit for refill

## 2019-07-16 NOTE — Telephone Encounter (Signed)
Spouse called about pt's ativan. He has one pill left. Wants to schedule vv this week, hopefully tomorrow afternoon. Is there a spot we can work them in?

## 2019-07-16 NOTE — Telephone Encounter (Signed)
Yes you can overbook them tomorrow

## 2019-07-17 ENCOUNTER — Telehealth: Admit: 2019-07-17 | Discharge: 2019-07-17 | Payer: MEDICARE | Attending: Legal Medicine | Primary: Legal Medicine

## 2019-07-17 ENCOUNTER — Encounter

## 2019-07-17 DIAGNOSIS — I10 Essential (primary) hypertension: Secondary | ICD-10-CM

## 2019-07-17 MED ORDER — PROPRANOLOL 20 MG TAB
20 mg | ORAL_TABLET | Freq: Two times a day (BID) | ORAL | 1 refills | Status: DC
Start: 2019-07-17 — End: 2020-01-28

## 2019-07-17 MED ORDER — LORAZEPAM 0.5 MG TAB
0.5 mg | ORAL_TABLET | Freq: Three times a day (TID) | ORAL | 0 refills | Status: DC | PRN
Start: 2019-07-17 — End: 2019-11-06

## 2019-07-17 MED ORDER — METFORMIN 500 MG TAB
500 mg | ORAL_TABLET | Freq: Two times a day (BID) | ORAL | 0 refills | Status: DC
Start: 2019-07-17 — End: 2019-10-01

## 2019-07-17 MED ORDER — ROSUVASTATIN 10 MG TAB
10 mg | ORAL_TABLET | ORAL | 1 refills | Status: DC
Start: 2019-07-17 — End: 2020-03-01

## 2019-07-17 NOTE — Telephone Encounter (Signed)
This was done.

## 2019-07-17 NOTE — Progress Notes (Signed)
William Blalock Viscardi Sr. is a 74 y.o. male who was seen by synchronous (real-time) audio-video technology on 07/17/2019 for Follow Up Chronic Condition and Medication Refill    blood sugar readings    after eating 250 when he was taking one tablet of metformin so he increased it to 500 mg twice day it is around 150   BP  128/68 yesterday   Bleeding secondary to hemorrhoid he had banding of his hemorrhoids with Dr. Hassell Done    Patient still having episodes of bleeding and he will be following up with Dr. Hassell Done for possible procedure EGD in 2 days.  Patient appetite has increased and he has gained weight            Assessment & Plan:   Diagnoses and all orders for this visit:    1. Essential hypertension    Stable  -     propranoloL (INDERAL) 20 mg tablet; Take 1 Tab by mouth two (2) times a day for 90 days.  -     rosuvastatin (CRESTOR) 10 mg tablet; TAKE 1 TABLET BY MOUTH NIGHTLY    2. Anxiety    Strongly encourage patient to decrease his dose from 1 mg to 0.5 mg    Patient last refill of Ativan was in September  -     LORazepam (ATIVAN) 0.5 mg tablet; Take 2 Tabs by mouth every eight (8) hours as needed for Anxiety for up to 30 days. Max Daily Amount: 3 mg.    3. Controlled type 2 diabetes mellitus with complication, without long-term current use of insulin (HCC)  -     metFORMIN (GLUCOPHAGE) 500 mg tablet; Take 1 Tab by mouth two (2) times daily (with meals) for 90 days.  -     propranoloL (INDERAL) 20 mg tablet; Take 1 Tab by mouth two (2) times a day for 90 days.  -     rosuvastatin (CRESTOR) 10 mg tablet; TAKE 1 TABLET BY MOUTH NIGHTLY    4. Esophageal varices without bleeding, unspecified esophageal varices type ([place])    5. Left hemiparesis ([place])    6. Narcotic dependence ([place])    7. Moderate major depression (HCC)  Stable on current medications     8. Aorto-iliac atherosclerosis ([place])    9. Myelodysplastic syndrome ([place])   stable  Follow up with hematology   10. GAD (generalized anxiety disorder)     -     LORazepam (ATIVAN) 0.5 mg tablet; Take 2 Tabs by mouth every eight (8) hours as needed for Anxiety for up to 30 days. Max Daily Amount: 3 mg.          712  Subjective:       Prior to Admission medications    Medication Sig Start Date End Date Taking? Authorizing Provider   metFORMIN (GLUCOPHAGE) 500 mg tablet Take 1 Tab by mouth two (2) times daily (with meals) for 90 days. 07/17/19 10/15/19 Yes Solina Heron, Kathi Ludwig, MD   propranoloL (INDERAL) 20 mg tablet Take 1 Tab by mouth two (2) times a day for 90 days. 07/17/19 10/15/19 Yes Azekiel Cremer, Kathi Ludwig, MD   rosuvastatin (CRESTOR) 10 mg tablet TAKE 1 TABLET BY MOUTH NIGHTLY 07/17/19  Yes Auther Lyerly S, MD   LORazepam (ATIVAN) 0.5 mg tablet Take 2 Tabs by mouth every eight (8) hours as needed for Anxiety for up to 30 days. Max Daily Amount: 3 mg. 07/17/19 08/16/19 Yes Kinzlie Harney Chauncey Cruel, MD   topiramate (TOPAMAX) 100 mg tablet  TAKE 1 TABLET BY MOUTH DAILY 07/02/19  Yes Hector Venne S, MD   finasteride (PROSCAR) 5 mg tablet TAKE 1 TABLET BY MOUTH DAILY 06/25/19  Yes Uzoma Vivona S, MD   sucralfate (CARAFATE) 1 gram tablet TAKE 1 TABLET BY MOUTH FOUR TIMES DAILY AT BEDTIME WITH MEALS 05/30/19  Yes Krishna Dancel S, MD   glucose blood VI test strips (ASCENSIA AUTODISC VI, ONE TOUCH ULTRA TEST VI) strip Use to test blood sugars four times daily with Onetouch ultra 2. 04/25/19  Yes Arita Severtson S, MD   propranoloL (INDERAL) 20 mg tablet TAKE 1 TABLET BY MOUTH TWICE DAILY 04/17/19  Yes Lima Chillemi S, MD   lancets (OneTouch UltraSoft Lancets) misc USE TO TEST BLOOD GLUCOSE FOUR TIMES DAILY 04/03/19  Yes [name], MD   prochlorperazine (COMPAZINE) 10 mg tablet Take 1 Tab by mouth every six (6) hours as needed for Nausea or Vomiting. 03/15/19  Yes Briget Shaheed S, MD   venlafaxine-SR (Effexor XR) 150 mg capsule Take 150 mg by mouth daily.   Yes Provider, Historical   aspirin (ASPIRIN) 325 mg tablet Take 325 mg by mouth daily. 10/25/18  Yes Provider, Historical    HumuLIN R Regular U-100 Insuln 100 unit/mL injection INJECT AS NEEDED PER SLIDING SCALE  Patient taking differently: INJECT AS NEEDED PER SLIDING SCALE Max dose 20 units daily 10/17/18  Yes Ethal Gotay S, MD   Insulin Syringe-Needle U-100 (BD INSULIN SYRINGE ULTRA-FINE) 0.3 mL 31 gauge x 5/16" syrg Use 4 times daily to inject insulin 08/23/18  Yes Moira Umholtz S, MD   Blood-Glucose Meter (ONETOUCH ULTRA2 METER) misc Use to check blood sugar BID 08/22/18  Yes Brianna Bennett S, MD   esomeprazole (NEXIUM) 20 mg capsule Take  by mouth daily.   Yes Provider, Historical   metoclopramide HCl (REGLAN) 10 mg tablet Take 10 mg by mouth Before breakfast, lunch, dinner and at bedtime.   Yes Provider, Historical   pramipexole (MIRAPEX) 0.125 mg tablet TAKE 1 TABLET BY MOUTH NIGHTLY 12/30/17  Yes Blayden Conwell S, MD   oxyCODONE IR (ROXICODONE) 10 mg tab immediate release tablet Take 10 mg by mouth every six (6) hours as needed for Pain.   Yes Provider, Historical   Insulin Syringe-Needle U-100 1 mL 29 gauge x 1/2" syrg Three times per day and as needed as per sliding scale 03/31/17  Yes Shadoe Bethel S, MD   naloxone (NARCAN) 4 mg/actuation nasal spray Use 1 spray intranasally into 1 nostril. Use a new Narcan nasal spray for subsequent doses and administer into alternating nostrils. May repeat every 2 to 3 minutes as needed for opioid overdose symptoms. 01/01/17  Yes [name], MD   cholecalciferol, vitamin D3, (VITAMIN D3) 2,000 unit tab Take  by mouth.   Yes Provider, Historical   cyclobenzaprine (FLEXERIL) 10 mg tablet Take  by mouth three (3) times daily as needed for Muscle Spasm(s).   Yes Provider, Historical   MAGNESIUM PO Take 1 Tab by mouth daily.    Provider, Historical   LORazepam (ATIVAN) 1 mg tablet Take 1 Tab by mouth daily for 90 days. Max Daily Amount: 1 mg. 03/15/19 07/17/19  [name], MD    metFORMIN (GLUCOPHAGE) 500 mg tablet TAKE 2 TABLETS BY MOUTH TWICE DAILY WITH MEALS 03/09/19 07/17/19  [name], MD   rosuvastatin (CRESTOR) 10 mg tablet TAKE 1 TABLET BY MOUTH NIGHTLY 11/10/18 07/17/19  [name], MD   Insulin Needles, Disposable, (BD  ULTRA-FINE SHORT PEN NEEDLE) 31 gauge x 5/16" ndle Use 4 times daily to inject insulin. 08/22/18   [name], MD     Patient Active Problem List   Diagnosis Code   ??? Chronic anemia D64.9   ??? Controlled type 2 diabetes mellitus without complication, without long-term current use of insulin ([place]) E11.9   ??? Essential hypertension I10   ??? Hyperlipidemia E78.5   ??? History of stroke Z86.73   ??? Anxiety F41.9   ??? Chronic pain G89.29   ??? Iron deficiency anemia D50.9   ??? 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 Other cirrhosis of liver (HCC) K74.69   ??? Depression F32.9   ??? Type II diabetes mellitus (HCC) E11.9   ??? HTN (hypertension) I10   ??? 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 Stroke (HCC) I63.9   ??? Compressed vertebrae [id number]   ??? Chronic back pain greater than 3 months duration M54.9, G89.29   ??? Hyperlipemia E78.5   ??? Anxiety F41.9   ??? 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 Iron deficiency anemia secondary to inadequate dietary iron intake D50.8   ??? 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 Thrombocytopenia ([place]) D69.6   ??? 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 Benign prostatic hyperplasia (BPH) with straining on urination N40.1, R39.16   ??? 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 Dermatitis L30.9   ??? Migraine without aura and without status migrainosus, not intractable G43.009   ??? Restless leg syndrome G25.81   ??? Peripheral vascular disease (HCC) I73.9   ??? Varices of esophagus determined by endoscopy ([place]) I85.00     Patient Active Problem List    Diagnosis Date Noted   ??? Varices of esophagus determined by endoscopy ([place]) 03/15/2019    ??? Peripheral vascular disease ([place]) 02/21/2018   ??? Restless leg syndrome 12/04/2017   ??? Migraine without aura and without status migrainosus, not intractable 04/15/2017   ??? 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 Dermatitis 01/05/2017   ??? Benign prostatic hyperplasia (BPH) with straining on urination 12/31/2016   ??? 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 Myelodysplastic syndrome ([place]) 12/22/2016   ??? Type 2 diabetes with nephropathy ([place]) 10/18/2016   ??? Iron deficiency anemia secondary to inadequate dietary iron intake 09/23/2016   ??? Chronic leukopenia 09/23/2016   ??? Thrombocytopenia ([place]) 09/23/2016   ??? Moderate major depression ([place]) 09/10/2016   ??? Type II diabetes mellitus ([place]) 08/03/2016   ??? HTN (hypertension) 08/03/2016   ??? 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 Stroke ([place]) 08/03/2016   ??? Compressed vertebrae 08/03/2016   ??? Chronic back pain greater than 3 months duration 08/03/2016   ??? Hyperlipemia 08/03/2016   ??? Anxiety 08/03/2016   ??? Depression 06/30/2016   ??? Iron deficiency anemia 04/22/2016   ??? NAFLD (nonalcoholic fatty liver disease) 04/22/2016   ??? Other cirrhosis of liver ([place]) 04/22/2016   ??? Chronic anemia 03/18/2016   ??? Controlled type 2 diabetes mellitus without complication, without long-term current use of insulin ([place]) 03/18/2016   ??? Essential hypertension 03/18/2016   ??? Hyperlipidemia 03/18/2016   ??? History of stroke 03/18/2016   ??? Anxiety 03/18/2016   ??? Chronic pain 03/18/2016     Current Outpatient Medications   Medication Sig Dispense Refill   ??? metFORMIN (GLUCOPHAGE) 500 mg tablet Take 1 Tab by mouth  two (2) times daily (with meals) for 90 days. 180 Tab 0   ??? propranoloL (INDERAL) 20 mg tablet Take 1 Tab by mouth two (2) times a day for 90 days. 180 Tab 1   ??? rosuvastatin (CRESTOR) 10 mg tablet TAKE 1 TABLET BY MOUTH NIGHTLY 90 Tab 1    ??? LORazepam (ATIVAN) 0.5 mg tablet Take 2 Tabs by mouth every eight (8) hours as needed for Anxiety for up to 30 days. Max Daily Amount: 3 mg. 180 Tab 0   ??? topiramate (TOPAMAX) 100 mg tablet TAKE 1 TABLET BY MOUTH DAILY 90 Tab 1   ??? finasteride (PROSCAR) 5 mg tablet TAKE 1 TABLET BY MOUTH DAILY 90 Tab 0   ??? sucralfate (CARAFATE) 1 gram tablet TAKE 1 TABLET BY MOUTH FOUR TIMES DAILY AT BEDTIME WITH MEALS 360 Tab 0   ??? glucose blood VI test strips (ASCENSIA AUTODISC VI, ONE TOUCH ULTRA TEST VI) strip Use to test blood sugars four times daily with Onetouch ultra 2. 400 Strip 3   ??? propranoloL (INDERAL) 20 mg tablet TAKE 1 TABLET BY MOUTH TWICE DAILY 180 Tab 0   ??? lancets (OneTouch UltraSoft Lancets) misc USE TO TEST BLOOD GLUCOSE FOUR TIMES DAILY 100 Each 5   ??? prochlorperazine (COMPAZINE) 10 mg tablet Take 1 Tab by mouth every six (6) hours as needed for Nausea or Vomiting. 30 Tab 1   ??? venlafaxine-SR (Effexor XR) 150 mg capsule Take 150 mg by mouth daily.     ??? aspirin (ASPIRIN) 325 mg tablet Take 325 mg by mouth daily.     ??? HumuLIN R Regular U-100 Insuln 100 unit/mL injection INJECT AS NEEDED PER SLIDING SCALE (Patient taking differently: INJECT AS NEEDED PER SLIDING SCALE Max dose 20 units daily) 10 mL 3   ??? Insulin Syringe-Needle U-100 (BD INSULIN SYRINGE ULTRA-FINE) 0.3 mL 31 gauge x 5/16" syrg Use 4 times daily to inject insulin 400 Syringe 1   ??? Blood-Glucose Meter (ONETOUCH ULTRA2 METER) misc Use to check blood sugar BID 1 Each 0   ??? esomeprazole (NEXIUM) 20 mg capsule Take  by mouth daily.     ??? metoclopramide HCl (REGLAN) 10 mg tablet Take 10 mg by mouth Before breakfast, lunch, dinner and at bedtime.     ??? pramipexole (MIRAPEX) 0.125 mg tablet TAKE 1 TABLET BY MOUTH NIGHTLY 30 Tab 0   ??? oxyCODONE IR (ROXICODONE) 10 mg tab immediate release tablet Take 10 mg by mouth every six (6) hours as needed for Pain.      ??? Insulin Syringe-Needle U-100 1 mL 29 gauge x 1/2" syrg Three times per day and as needed as per sliding scale 100 Syringe 6   ??? naloxone (NARCAN) 4 mg/actuation nasal spray Use 1 spray intranasally into 1 nostril. Use a new Narcan nasal spray for subsequent doses and administer into alternating nostrils. May repeat every 2 to 3 minutes as needed for opioid overdose symptoms. 1 Each 0   ??? cholecalciferol, vitamin D3, (VITAMIN D3) 2,000 unit tab Take  by mouth.     ??? cyclobenzaprine (FLEXERIL) 10 mg tablet Take  by mouth three (3) times daily as needed for Muscle Spasm(s).     ??? MAGNESIUM PO Take 1 Tab by mouth daily.     ??? Insulin Needles, Disposable, (BD ULTRA-FINE SHORT PEN NEEDLE) 31 gauge x 5/16" ndle Use 4 times daily to inject insulin. 1 Package 11     Allergies   Allergen Reactions   ??? Doxycycline  Hives   ??? Doxycycline Rash   ??? Gabapentin Other (comments)     Per pt became violent    ??? Gabapentin Other (comments)     Slurred speech   ??? Lyrica [Pregabalin] Other (comments)     Per pt stroke symptoms   ??? Lyrica [Pregabalin] Anxiety   ??? Penicillin G Rash   ??? Penicillins Hives     Past Medical History:   Diagnosis Date   ??? Anemia    ??? Anxiety    ??? Chronic pain    ??? Cirrhosis of liver ([place])    ??? Diabetes ([place])    ??? GERD (gastroesophageal reflux disease)    ??? Hyperlipemia    ??? Hypertension    ??? Hypotension    ??? Migraine    ??? Other cirrhosis of liver ([place])    ??? Stroke [facility])     had 3 strokes    ??? Thrombocytopenia ([place])      Past Surgical History:   Procedure Laterality Date   ??? HX CHOLECYSTECTOMY     ??? HX ORTHOPAEDIC      R hand sx   ??? HX OTHER SURGICAL      Hand surgery- Nail gun went through finger     Family History   Problem Relation Age of Onset   ??? Alzheimer Mother    ??? Diabetes Mother    ??? Cancer Mother         colon cancer    ??? Hypertension Father    ??? Heart Disease Father    ??? Cancer Father         prostate, lung cancer    ??? Diabetes Sister    ??? Heart Disease Sister    ??? Diabetes Brother       Social History     Tobacco Use   ??? Smoking status: Former Smoker   ??? Smokeless tobacco: Never Used   ??? Tobacco comment: quit years ago   Substance Use Topics   ??? Alcohol use: No       Review of Systems   Constitutional: Negative for chills, fever, malaise/fatigue and weight loss.   HENT: Negative for congestion, ear discharge, ear pain, hearing loss, nosebleeds and sinus pain.    Eyes: Negative for blurred vision, double vision and discharge.   Respiratory: Negative for cough, sputum production, shortness of breath and wheezing.    Cardiovascular: Negative for chest pain, palpitations, claudication and leg swelling.   Gastrointestinal: Positive for blood in stool and diarrhea. Negative for abdominal pain, constipation, nausea and vomiting.   Genitourinary: Negative for dysuria, frequency, hematuria and urgency.   Musculoskeletal: Positive for back pain and myalgias.   Skin: Negative for itching and rash.   Neurological: Negative for dizziness, tingling, sensory change, speech change, focal weakness, weakness and headaches.   Psychiatric/Behavioral: Negative for depression, hallucinations, substance abuse and suicidal ideas. The patient is nervous/anxious.        Objective:     Patient-Reported Vitals 05/17/2019   Patient-Reported Weight 126.5lbs   Patient-Reported Pulse 68   Patient-Reported Systolic  -   Patient-Reported Diastolic -      General: alert, cooperative, no distress   Mental  status: normal mood, behavior, speech, dress, motor activity, and thought processes, able to follow commands   HENT: NCAT   Neck: no visualized mass   Resp: no respiratory distress   Neuro: no gross deficits   Skin: no discoloration or lesions of concern on visible areas  Psychiatric: normal affect, consistent with stated mood, no evidence of hallucinations     Additional exam findings:        We discussed the expected course, resolution and complications of the diagnosis(es) in detail.  Medication risks, benefits, costs, interactions, and alternatives were discussed as indicated.  I advised him to contact the office if his condition worsens, changes or fails to improve as anticipated. He expressed understanding with the diagnosis(es) and plan.       William Burger Sr., who was evaluated through a patient-initiated, synchronous (real-time) audio-video encounter, and/or his healthcare decision maker, is aware that it is a billable service, with coverage as determined by his insurance carrier. He provided verbal consent to proceed: Yes, and patient identification was verified. It was conducted pursuant to the emergency declaration under the [place], [place] waiver authority and the [employer] Act. A caregiver was present when appropriate. Ability to conduct physical exam was limited. I was in the office. The patient was at home.      [name], MD

## 2019-07-18 ENCOUNTER — Telehealth: Admit: 2019-07-18 | Discharge: 2019-07-18 | Payer: MEDICARE | Attending: Internal Medicine | Primary: Legal Medicine

## 2019-07-18 DIAGNOSIS — D508 Other iron deficiency anemias: Secondary | ICD-10-CM

## 2019-07-18 NOTE — Progress Notes (Signed)
Verl Blalock Carlucci Sr. is a 74 y.o. male, evaluated via audio-only technology on 07/18/2019 for refractory anemia.    Assessment & Plan:   Iron deficiency anemia  Refractory anemia, concerned of myelodysplastic syndrome   -- 01/03/2019: CBC showed a WBC of 3.6K/uL, hemoglobin was 11.8g/dL with hematocrit of 35.3%. Platelet count was 88,000.  -- Ferritin level was 44ng/mL. Iron Profile showed an Iron level of 51ug/dL, Iron % saturation 13%. BUN 16mg /dL and creatinine was 0.92mg /dL  -- Patient was being followed by Dr. Nadyne Coombes who retired. He has been offered Procrit 60,000 units every 2 weeks when the hemoglobin is below 10 g/dL and hematocrit is below 30%.  -- S.p Injectafer 750mg  IV x 2 doses for iron deficiency anemia   -- Today I have reviewed with the patient and family about recent lab findings.  07/04/2019 CBC reported hemoglobin improving to 14, hematocrit 43.8%, MCV 100.7, total WBC 4.5 with ALC 0.4, and platelet was stable at 99,000.  Iron profile showed saturation 20% and ferritin was improving to 462.    Plan:  -- Patient will follow up with his GI for occasional rectal bleeding.  Denied any acute bleeding at this time.  -- Will continue to monitor CBC, CMP, Iron profile , and ferritin prior to next office visit    Chronic leukopenia  -- 01/03/2019: CBC showed a WBC of 3.6K/uL, Absolute neutrophil count which is normal at 2.8K/UL, Absolute lymphocyte count is low at 0.4 K/UL.    -- 07/04/2019 CBC reported hemoglobin improving to 14, hematocrit 43.8%, MCV 100.7, total WBC 4.5 with ALC 0.4, and platelet was stable at 99,000.    Thrombocytopenia  -- 01/03/2019: CBC showed a WBC of 3.6K/uL. Hemoglobin was 11.8g/dL with hematocrit of 35.3%. Platelet count was 88,000.  -- 07/04/2019 CBC reported hemoglobin improving to 14, hematocrit 43.8%, MCV 100.7, total WBC 4.5 with ALC 0.4, and platelet was stable at 99,000.  -- Clinical stable without any bleeding or bruising at this time  -- Will continue to monitor.    12   Subjective:   Mr. Huard is a 74 year old man who has myelodysplastic syndrome with severe anemia.  He also has chronic leukopenia and thrombocytopenia.  He is currently receiving Procrit 60,000 units every 2 weeks as needed for hemoglobin < 10 g/dL and hematocrit of < 30 %.      He said he is feeling well after IV iron.  He noted occasional mild rectal bleeding who is being follow-up by his GI.  He denied any acute bleeding, blood in stool, melena, bruising at this point.  He denies having fever, chills, dizziness, rashes, lymphadenopathy. He has no weakness and fatigue. He said he is now  sleeping well at night since he bought a new mattress. He has no complaints or concerns at this time.    Prior to Admission medications    Medication Sig Start Date End Date Taking? Authorizing Provider   MAGNESIUM PO Take 1 Tab by mouth daily.    Provider, Historical   metFORMIN (GLUCOPHAGE) 500 mg tablet Take 1 Tab by mouth two (2) times daily (with meals) for 90 days. 07/17/19 10/15/19  [name], MD   propranoloL (INDERAL) 20 mg tablet Take 1 Tab by mouth two (2) times a day for 90 days. 07/17/19 10/15/19  [name], MD   rosuvastatin (CRESTOR) 10 mg tablet TAKE 1 TABLET BY MOUTH NIGHTLY 07/17/19   [name], MD   LORazepam (ATIVAN) 0.5 mg tablet Take 2  Tabs by mouth every eight (8) hours as needed for Anxiety for up to 30 days. Max Daily Amount: 3 mg. 07/17/19 08/16/19  [name], MD   topiramate (TOPAMAX) 100 mg tablet TAKE 1 TABLET BY MOUTH DAILY 07/02/19   [name], MD   finasteride (PROSCAR) 5 mg tablet TAKE 1 TABLET BY MOUTH DAILY 06/25/19   [name], MD   sucralfate (CARAFATE) 1 gram tablet TAKE 1 TABLET BY MOUTH FOUR TIMES DAILY AT BEDTIME WITH MEALS 05/30/19   Elhassan, Amira S, MD   glucose blood VI test strips (ASCENSIA AUTODISC VI, ONE TOUCH ULTRA TEST VI) strip Use to test blood sugars four times daily with Onetouch ultra 2. 04/25/19   [name], MD   propranoloL  (INDERAL) 20 mg tablet TAKE 1 TABLET BY MOUTH TWICE DAILY 04/17/19   [name], MD   lancets (OneTouch UltraSoft Lancets) misc USE TO TEST BLOOD GLUCOSE FOUR TIMES DAILY 04/03/19   [name], MD   prochlorperazine (COMPAZINE) 10 mg tablet Take 1 Tab by mouth every six (6) hours as needed for Nausea or Vomiting. 03/15/19   [name], MD   venlafaxine-SR (Effexor XR) 150 mg capsule Take 150 mg by mouth daily.    Provider, Historical   aspirin (ASPIRIN) 325 mg tablet Take 325 mg by mouth daily. 10/25/18   Provider, Historical   HumuLIN R Regular U-100 Insuln 100 unit/mL injection INJECT AS NEEDED PER SLIDING SCALE  Patient taking differently: INJECT AS NEEDED PER SLIDING SCALE Max dose 20 units daily 10/17/18   [name], MD   Insulin Syringe-Needle U-100 (BD INSULIN SYRINGE ULTRA-FINE) 0.3 mL 31 gauge x 5/16" syrg Use 4 times daily to inject insulin 08/23/18   Elhassan, Amira S, MD   Insulin Needles, Disposable, (BD ULTRA-FINE SHORT PEN NEEDLE) 31 gauge x 5/16" ndle Use 4 times daily to inject insulin. 08/22/18   [name], MD   Blood-Glucose Meter [facility] METER) misc Use to check blood sugar BID 08/22/18   [name], MD   esomeprazole (NEXIUM) 20 mg capsule Take  by mouth daily.    Provider, Historical   metoclopramide HCl (REGLAN) 10 mg tablet Take 10 mg by mouth Before breakfast, lunch, dinner and at bedtime.    Provider, Historical   pramipexole (MIRAPEX) 0.125 mg tablet TAKE 1 TABLET BY MOUTH NIGHTLY 12/30/17   [name], MD   oxyCODONE IR (ROXICODONE) 10 mg tab immediate release tablet Take 10 mg by mouth every six (6) hours as needed for Pain.    Provider, Historical   Insulin Syringe-Needle U-100 1 mL 29 gauge x 1/2" syrg Three times per day and as needed as per sliding scale 03/31/17   [name], MD   naloxone [facility]) 4 mg/actuation nasal spray Use 1 spray intranasally into 1 nostril. Use a new Narcan nasal spray for subsequent doses and  administer into alternating nostrils. May repeat every 2 to 3 minutes as needed for opioid overdose symptoms. 01/01/17   [name], MD   cholecalciferol, vitamin D3, (VITAMIN D3) 2,000 unit tab Take  by mouth.    Provider, Historical   cyclobenzaprine (FLEXERIL) 10 mg tablet Take  by mouth three (3) times daily as needed for Muscle Spasm(s).    Provider, Historical         Review of Systems   Constitutional: Positive for malaise/fatigue. Negative for chills, diaphoresis, fever and weight loss.   Respiratory:  Negative for cough, hemoptysis, shortness of breath and wheezing.    Cardiovascular: Negative for chest pain, palpitations and leg swelling.   Gastrointestinal: Negative for abdominal pain, diarrhea, heartburn, nausea and vomiting.   Genitourinary: Negative for dysuria, frequency, hematuria and urgency.   Musculoskeletal: Negative for joint pain and myalgias.   Skin: Negative for itching and rash.   Neurological: Negative for dizziness, seizures, weakness and headaches.   Psychiatric/Behavioral: Negative for depression. The patient does not have insomnia.        Patient-Reported Vitals 05/17/2019   Patient-Reported Weight 126.5lbs   Patient-Reported Pulse 68   Patient-Reported Systolic  -   Patient-Reported Diastolic -        Sherryll Burger Sr., who was evaluated through a patient-initiated, synchronous (real-time) audio only encounter, and/or her healthcare decision maker, is aware that it is a billable service, with coverage as determined by his insurance carrier. He provided verbal consent to proceed: Yes. He has not had a related appointment within my department in the past 7 days or scheduled within the next 24 hours.      Total Time: minutes: 11-20 minutes    Flossie Buffy, MD

## 2019-07-20 NOTE — Telephone Encounter (Signed)
Called and left message for pt to contact office to schedule

## 2019-07-23 NOTE — Telephone Encounter (Signed)
Pts wife lvm stating pt had a procedure done @ [facility] on Friday & they told her that his platlets are low, she wants to know if he needs to come in for more blood work before May

## 2019-07-26 ENCOUNTER — Ambulatory Visit: Payer: MEDICARE | Primary: Legal Medicine

## 2019-07-30 ENCOUNTER — Other Ambulatory Visit: Admit: 2019-07-30 | Discharge: 2019-07-30 | Payer: MEDICARE | Primary: Legal Medicine

## 2019-07-30 ENCOUNTER — Inpatient Hospital Stay: Admit: 2019-07-30 | Payer: MEDICARE | Primary: Legal Medicine

## 2019-07-30 DIAGNOSIS — D508 Other iron deficiency anemias: Secondary | ICD-10-CM

## 2019-07-30 LAB — CBC WITH AUTOMATED DIFF
ABS. BASOPHILS: 0 10*3/uL (ref 0.0–0.1)
ABS. EOSINOPHILS: 0.2 10*3/uL (ref 0.0–0.4)
ABS. LYMPHOCYTES: 0.4 10*3/uL — ABNORMAL LOW (ref 0.9–3.6)
ABS. MONOCYTES: 0.5 10*3/uL (ref 0.05–1.2)
ABS. NEUTROPHILS: 4.4 10*3/uL (ref 1.8–8.0)
BASOPHILS: 0 % (ref 0–2)
EOSINOPHILS: 3 % (ref 0–5)
HCT: 43.6 % (ref 36.0–48.0)
HGB: 14.3 g/dL (ref 13.0–16.0)
LYMPHOCYTES: 8 % — ABNORMAL LOW (ref 21–52)
MCH: 32.1 PG (ref 24.0–34.0)
MCHC: 32.8 g/dL (ref 31.0–37.0)
MCV: 97.8 FL — ABNORMAL HIGH (ref 74.0–97.0)
MONOCYTES: 9 % (ref 3–10)
MPV: 10.8 FL (ref 9.2–11.8)
NEUTROPHILS: 80 % — ABNORMAL HIGH (ref 40–73)
PLATELET: 100 10*3/uL — ABNORMAL LOW (ref 135–420)
RBC: 4.46 M/uL — ABNORMAL LOW (ref 4.70–5.50)
RDW: 15.1 % — ABNORMAL HIGH (ref 11.6–14.5)
WBC: 5.5 10*3/uL (ref 4.6–13.2)

## 2019-07-30 LAB — METABOLIC PANEL, COMPREHENSIVE
A-G Ratio: 1.1 (ref 0.8–1.7)
ALT (SGPT): 96 U/L — ABNORMAL HIGH (ref 16–61)
AST (SGOT): 89 U/L — ABNORMAL HIGH (ref 10–38)
Albumin: 3.8 g/dL (ref 3.4–5.0)
Alk. phosphatase: 567 U/L — ABNORMAL HIGH (ref 45–117)
Anion gap: 5 mmol/L (ref 3.0–18)
BUN/Creatinine ratio: 13 (ref 12–20)
BUN: 12 MG/DL (ref 7.0–18)
Bilirubin, total: 0.6 MG/DL (ref 0.2–1.0)
CO2: 28 mmol/L (ref 21–32)
Calcium: 9 MG/DL (ref 8.5–10.1)
Chloride: 109 mmol/L (ref 100–111)
Creatinine: 0.94 MG/DL (ref 0.6–1.3)
GFR est AA: >60 mL/min/{1.73_m2} (ref >60)
GFR est non-AA: >60 mL/min/{1.73_m2} (ref >60)
Globulin: 3.5 g/dL (ref 2.0–4.0)
Glucose: 110 mg/dL — ABNORMAL HIGH (ref 74–99)
Potassium: 4.7 mmol/L (ref 3.5–5.5)
Protein, total: 7.3 g/dL (ref 6.4–8.2)
Sodium: 142 mmol/L (ref 136–145)

## 2019-07-30 LAB — IRON PROFILE
Iron % saturation: 16 % — ABNORMAL LOW (ref 20–50)
Iron: 46 ug/dL — ABNORMAL LOW (ref 50–175)
TIBC: 295 ug/dL (ref 250–450)

## 2019-07-30 LAB — FERRITIN: Ferritin: 252 NG/ML (ref 8–388)

## 2019-07-30 NOTE — Telephone Encounter (Signed)
rayanne bassig office employee calling in about pt having cirrhosis of the liver and is enlarged. Advised pt to go to the Er if uncomfortable or worsening

## 2019-07-30 NOTE — Telephone Encounter (Signed)
Pts wife called to say that pt has cirrhosis of the liver and over the last few days his stomach has become enlarged and he is very uncomfortable. I spoke with the nurse, Estill Bamberg, and she advised me to tell the pt's wife that it is recommended that they go to the ER for evaluation. Pt said he didn't want to go, I told the wife that per [facility], there could possibly be fluid buildup in his stomach. Pt's wife said okay and that she would see what she could do.

## 2019-07-30 NOTE — Telephone Encounter (Signed)
Patient was scheduled with Dr. Hassell Done, but wife stated she got him to go to the ER.  [name] wants to keep that appointment if they don't keep him.  His wife will call us in the am and let us know what the plan is or if he was kept.

## 2019-07-31 NOTE — Telephone Encounter (Signed)
Patient's wife called and stated he was admitted to the ER today and they Dr. Earlie Server office drained off 4 1/2 liters of fluid off on him.  She stated he will be discharged today.

## 2019-07-31 NOTE — Telephone Encounter (Signed)
Noted  

## 2019-07-31 NOTE — Telephone Encounter (Signed)
Noted     See other note

## 2019-08-07 NOTE — Telephone Encounter (Addendum)
Ms. Krengel lvm pt had labs last week pt is very week. Not sure if pt needs iron or if his platelets are low.Please call [phone].    Pt left another message  @4 :04 asking if pt need infusion appt

## 2019-08-15 ENCOUNTER — Ambulatory Visit: Admit: 2019-08-15 | Discharge: 2019-08-15 | Payer: MEDICARE | Attending: Legal Medicine | Primary: Legal Medicine

## 2019-08-15 DIAGNOSIS — E118 Type 2 diabetes mellitus with unspecified complications: Secondary | ICD-10-CM

## 2019-08-15 LAB — AMB POC GLUCOSE BLOOD, BY GLUCOSE MONITORING DEVICE: Glucose POC: 87 mg/dL

## 2019-08-15 NOTE — Progress Notes (Signed)
William Blalock Chamberlain Sr. is a 74 y.o. male (DOB: 1946/04/27) presenting to address:    Chief Complaint   Patient presents with   ??? Hospital Follow Up   ??? High Blood Sugar     have been running high since coming home from the hospital .        Vitals:    08/15/19 1305   BP: 107/65   Pulse: 67   Resp: 16   Temp: 97.3 ??F (36.3 ??C)   TempSrc: Temporal   SpO2: 100%   Weight: 130 lb (59 kg)   Height: 5\' 9"  (1.753 m)   PainSc:   6   PainLoc: Abdomen       Hearing/Vision:   No exam data present    Learning Assessment:     Learning Assessment 02/21/2018   PRIMARY LEARNER Patient   HIGHEST LEVEL OF EDUCATION - PRIMARY LEARNER  -   BARRIERS PRIMARY LEARNER -   CO-LEARNER CAREGIVER -   PRIMARY LANGUAGE ENGLISH   LEARNER PREFERENCE PRIMARY READING     LISTENING     VIDEOS   ANSWERED BY patient   RELATIONSHIP SELF     Depression Screening:     3 most recent PHQ Screens 08/15/2019   Little interest or pleasure in doing things -   Feeling down, depressed, irritable, or hopeless Not at all   Total Score PHQ 2 -     Fall Risk Assessment:     Fall Risk Assessment, last 12 mths 05/17/2019   Able to walk? Yes   Fall in past 12 months? No   Number of falls in past 12 months -   Fall with injury? -     Abuse Screening:     Abuse Screening Questionnaire 05/17/2019   Do you ever feel afraid of your partner? N   Are you in a relationship with someone who physically or mentally threatens you? N   Is it safe for you to go home? Y     Coordination of Care Questionaire:   1. Have you been to the ER, urgent care clinic since your last visit?  Hospitalized since your last visit? YES    2. Have you seen or consulted any other health care providers outside of the [facility] since your last visit?  Include any pap smears or colon screening. NO    Advanced Directive:   1. Do you have an Advanced Directive? NO    2. Would you like information on Advanced Directives? NO

## 2019-08-15 NOTE — Patient Instructions (Signed)
Learning About Diabetes Food Guidelines  Your Care Instructions     Meal planning is important to manage diabetes. It helps keep your blood sugar at a target level (which you set with your doctor). You don't have to eat special foods. You can eat what your family eats, including sweets once in a while. But you do have to pay attention to how often you eat and how much you eat of certain foods.  You may want to work with a dietitian or a certified diabetes educator (CDE) to help you plan meals and snacks. A dietitian or CDE can also help you lose weight if that is one of your goals.  What should you know about eating carbs?  Managing the amount of carbohydrate (carbs) you eat is an important part of healthy meals when you have diabetes. Carbohydrate is found in many foods.  ?? Learn which foods have carbs. And learn the amounts of carbs in different foods.  ? Bread, cereal, pasta, and rice have about 15 grams of carbs in a serving. A serving is 1 slice of bread (1 ounce), ?? cup of cooked cereal, or 1/3 cup of cooked pasta or rice.  ? Fruits have 15 grams of carbs in a serving. A serving is 1 small fresh fruit, such as an apple or orange; ?? of a banana; ?? cup of cooked or canned fruit; ?? cup of fruit juice; 1 cup of melon or raspberries; or 2 tablespoons of dried fruit.  ? Milk and no-sugar-added yogurt have 15 grams of carbs in a serving. A serving is 1 cup of milk or 2/3 cup of no-sugar-added yogurt.  ? Starchy vegetables have 15 grams of carbs in a serving. A serving is ?? cup of mashed potatoes or sweet potato; 1 cup winter squash; ?? of a small baked potato; ?? cup of cooked beans; or ?? cup cooked corn or green peas.  ?? Learn how much carbs to eat each day and at each meal. A dietitian or CDE can teach you how to keep track of the amount of carbs you eat. This is called carbohydrate counting.   ?? If you are not sure how to count carbohydrate grams, use the Plate Method to plan meals. It is a good, quick way to make sure that you have a balanced meal. It also helps you spread carbs throughout the day.  ? Divide your plate by types of foods. Put non-starchy vegetables on half the plate, meat or other protein food on one-quarter of the plate, and a grain or starchy vegetable in the final quarter of the plate. To this you can add a small piece of fruit and 1 cup of milk or yogurt, depending on how many carbs you are supposed to eat at a meal.  ?? Try to eat about the same amount of carbs at each meal. Do not "save up" your daily allowance of carbs to eat at one meal.  ?? Proteins have very little or no carbs per serving. Examples of proteins are beef, chicken, turkey, fish, eggs, tofu, cheese, cottage cheese, and peanut butter. A serving size of meat is 3 ounces, which is about the size of a deck of cards. Examples of meat substitute serving sizes (equal to 1 ounce of meat) are 1/4 cup of cottage cheese, 1 egg, 1 tablespoon of peanut butter, and ?? cup of tofu.  How can you eat out and still eat healthy?  ?? Learn to estimate the serving sizes of   foods that have carbohydrate. If you measure food at home, it will be easier to estimate the amount in a serving of restaurant food.  ?? If the meal you order has too much carbohydrate (such as potatoes, corn, or baked beans), ask to have a low-carbohydrate food instead. Ask for a salad or green vegetables.  ?? If you use insulin, check your blood sugar before and after eating out to help you plan how much to eat in the future.  ?? If you eat more carbohydrate at a meal than you had planned, take a walk or do other exercise. This will help lower your blood sugar.  What else should you know?   ?? Limit saturated fat, such as the fat from meat and dairy products. This is a healthy choice because people who have diabetes are at higher risk of heart disease. So choose lean cuts of meat and nonfat or low-fat dairy products. Use olive or canola oil instead of butter or shortening when cooking.  ?? Don't skip meals. Your blood sugar may drop too low if you skip meals and take insulin or certain medicines for diabetes.  ?? Check with your doctor before you drink alcohol. Alcohol can cause your blood sugar to drop too low. Alcohol can also cause a bad reaction if you take certain diabetes medicines.  Follow-up care is a key part of your treatment and safety. Be sure to make and go to all appointments, and call your doctor if you are having problems. It's also a good idea to know your test results and keep a list of the medicines you take.  Where can you learn more?  Go to http://clayton-rivera.info/  Enter I147 in the search box to learn more about "Learning About Diabetes Food Guidelines."  Current as of: June 16, 2018??????????????????????????????Content Version: 12.6  ?? 2006-2020 [employer].   Care instructions adapted under license by [employer] (which disclaims liability or warranty for this information). If you have questions about a medical condition or this instruction, always ask your healthcare professional. [place] any warranty or liability for your use of this information.         Counting Carbohydrates: Care Instructions  Your Care Instructions     You don't have to eat special foods when you have diabetes. You just have to be careful to eat healthy foods. Carbohydrates (carbs) raise blood sugar higher and quicker than any other nutrient. Carbs are found in desserts, breads and cereals, and fruit. They're also in starchy vegetables. These include potatoes, corn, and grains such as rice and pasta. Carbs are also in milk and yogurt.   The more carbs you eat at one time, the higher your blood sugar will rise. Spreading carbs all through the day helps keep your blood sugar levels within your target range.  Counting carbs is one of the best ways to keep your blood sugar under control.  If you use insulin, counting carbs helps you match the right amount of insulin to the number of grams of carbs in a meal. Then you can change your diet and insulin dose as needed. Testing your blood sugar several times a day can help you learn how carbs affect your blood sugar.  A registered dietitian or certified diabetes educator can help you plan meals and snacks.  Follow-up care is a key part of your treatment and safety. Be sure to make and go to all appointments, and call your doctor if you are having problems.  It's also a good idea to know your test results and keep a list of the medicines you take.  How can you care for yourself at home?  Know your daily amount of carbohydrates  Your daily amount depends on several things, such as your weight, how active you are, which diabetes medicines you take, and what your goals are for your blood sugar levels. A registered dietitian or certified diabetes educator can help you plan how many carbs to include in each meal and snack.  For most adults, a guideline for the daily amount of carbs is:  ?? 45 to 60 grams at each meal. That's about the same as 3 to 4 carbohydrate servings.  ?? 15 to 20 grams at each snack. That's about the same as 1 carbohydrate serving.  Count carbs  Counting carbs lets you know how much rapid-acting insulin to take before you eat. If you use an insulin pump, you get a constant rate of insulin during the day. So the pump must be programmed at meals. This gives you extra insulin to cover the rise in blood sugar after meals.  If you take insulin:   ?? Learn your own insulin-to-carb ratio. You and your diabetes health professional will figure out the ratio. You can do this by testing your blood sugar after meals. For example, you may need a certain amount of insulin for every 15 grams of carbs.  ?? Add up the carb grams in a meal. Then you can figure out how many units of insulin to take based on your insulin-to-carb ratio.  ?? Exercise lowers blood sugar. You can use less insulin than you would if you were not doing exercise. Keep in mind that timing matters. If you exercise within 1 hour after a meal, your body may need less insulin for that meal than it would if you exercised 3 hours after the meal. Test your blood sugar to find out how exercise affects your need for insulin.  If you do or don't take insulin:  ?? Look at labels on packaged foods. This can tell you how many carbs are in a serving. You can also use guides from the American Diabetes Association.  ?? Be aware of portions, or serving sizes. If a package has two servings and you eat the whole package, you need to double the number of grams of carbohydrate listed for one serving.  ?? Protein, fat, and fiber do not raise blood sugar as much as carbs do. If you eat a lot of these nutrients in a meal, your blood sugar will rise more slowly than it would otherwise.  Eat from all food groups  ?? Eat at least three meals a day.  ?? Plan meals to include food from all the food groups. The food groups include grains, fruits, dairy, proteins, and vegetables.  ?? Talk to your dietitian or diabetes educator about ways to add limited amounts of sweets into your meal plan.  ?? If you drink alcohol, talk to your doctor. It may not be recommended when you are taking certain diabetes medicines.  Where can you learn more?  Go to http://clayton-rivera.info/  Enter G703 in the search box to learn more about "Counting Carbohydrates: Care Instructions."   Current as of: June 16, 2018??????????????????????????????Content Version: 12.6  ?? 2006-2020 Healthwise, Inc[employer].   Care instructions adapted under license by [employer] (which disclaims liability or warranty for this information). If you have questions about a medical condition or this instruction, always  ask your healthcare professional. [place] any warranty or liability for your use of this information.         Learning About Diabetes Food Guidelines  Your Care Instructions     Meal planning is important to manage diabetes. It helps keep your blood sugar at a target level (which you set with your doctor). You don't have to eat special foods. You can eat what your family eats, including sweets once in a while. But you do have to pay attention to how often you eat and how much you eat of certain foods.  You may want to work with a dietitian or a certified diabetes educator (CDE) to help you plan meals and snacks. A dietitian or CDE can also help you lose weight if that is one of your goals.  What should you know about eating carbs?  Managing the amount of carbohydrate (carbs) you eat is an important part of healthy meals when you have diabetes. Carbohydrate is found in many foods.  ?? Learn which foods have carbs. And learn the amounts of carbs in different foods.  ? Bread, cereal, pasta, and rice have about 15 grams of carbs in a serving. A serving is 1 slice of bread (1 ounce), ?? cup of cooked cereal, or 1/3 cup of cooked pasta or rice.  ? Fruits have 15 grams of carbs in a serving. A serving is 1 small fresh fruit, such as an apple or orange; ?? of a banana; ?? cup of cooked or canned fruit; ?? cup of fruit juice; 1 cup of melon or raspberries; or 2 tablespoons of dried fruit.  ? Milk and no-sugar-added yogurt have 15 grams of carbs in a serving. A serving is 1 cup of milk or 2/3 cup of no-sugar-added yogurt.   ? Starchy vegetables have 15 grams of carbs in a serving. A serving is ?? cup of mashed potatoes or sweet potato; 1 cup winter squash; ?? of a small baked potato; ?? cup of cooked beans; or ?? cup cooked corn or green peas.  ?? Learn how much carbs to eat each day and at each meal. A dietitian or CDE can teach you how to keep track of the amount of carbs you eat. This is called carbohydrate counting.  ?? If you are not sure how to count carbohydrate grams, use the Plate Method to plan meals. It is a good, quick way to make sure that you have a balanced meal. It also helps you spread carbs throughout the day.  ? Divide your plate by types of foods. Put non-starchy vegetables on half the plate, meat or other protein food on one-quarter of the plate, and a grain or starchy vegetable in the final quarter of the plate. To this you can add a small piece of fruit and 1 cup of milk or yogurt, depending on how many carbs you are supposed to eat at a meal.  ?? Try to eat about the same amount of carbs at each meal. Do not "save up" your daily allowance of carbs to eat at one meal.  ?? Proteins have very little or no carbs per serving. Examples of proteins are beef, chicken, [place], fish, eggs, tofu, cheese, cottage cheese, and peanut butter. A serving size of meat is 3 ounces, which is about the size of a deck of cards. Examples of meat substitute serving sizes (equal to 1 ounce of meat) are 1/4 cup of cottage cheese, 1 egg, 1 tablespoon of peanut  butter, and ?? cup of tofu.  How can you eat out and still eat healthy?  ?? Learn to estimate the serving sizes of foods that have carbohydrate. If you measure food at home, it will be easier to estimate the amount in a serving of restaurant food.  ?? If the meal you order has too much carbohydrate (such as potatoes, corn, or baked beans), ask to have a low-carbohydrate food instead. Ask for a salad or green vegetables.   ?? If you use insulin, check your blood sugar before and after eating out to help you plan how much to eat in the future.  ?? If you eat more carbohydrate at a meal than you had planned, take a walk or do other exercise. This will help lower your blood sugar.  What else should you know?  ?? Limit saturated fat, such as the fat from meat and dairy products. This is a healthy choice because people who have diabetes are at higher risk of heart disease. So choose lean cuts of meat and nonfat or low-fat dairy products. Use olive or canola oil instead of butter or shortening when cooking.  ?? Don't skip meals. Your blood sugar may drop too low if you skip meals and take insulin or certain medicines for diabetes.  ?? Check with your doctor before you drink alcohol. Alcohol can cause your blood sugar to drop too low. Alcohol can also cause a bad reaction if you take certain diabetes medicines.  Follow-up care is a key part of your treatment and safety. Be sure to make and go to all appointments, and call your doctor if you are having problems. It's also a good idea to know your test results and keep a list of the medicines you take.  Where can you learn more?  Go to http://clayton-rivera.info/  Enter I147 in the search box to learn more about "Learning About Diabetes Food Guidelines."  Current as of: June 16, 2018??????????????????????????????Content Version: 12.6  ?? 2006-2020 Health[employer], [employer].   Care instructions adapted under license by [employer] (which disclaims liability or warranty for this information). If you have questions about a medical condition or this instruction, always ask your healthcare professional. [place] any warranty or liability for your use of this information.         Learning About Meal Planning for Diabetes  Why plan your meals?      Meal planning can be a key part of managing diabetes. Planning meals and snacks with the right balance of carbohydrate, protein, and fat can help you keep your blood sugar at the target level you set with your doctor.  You don't have to eat special foods. You can eat what your family eats, including sweets once in a while. But you do have to pay attention to how often you eat and how much you eat of certain foods.  You may want to work with a dietitian or a certified diabetes educator. He or she can give you tips and meal ideas and can answer your questions about meal planning. This health professional can also help you reach a healthy weight if that is one of your goals.  What plan is right for you?  Your dietitian or diabetes educator may suggest that you start with the plate format or carbohydrate counting.  The plate format  The plate format is a simple way to help you manage how you eat. You plan meals by learning how much space each food should  take on a plate. Using the plate format helps you spread carbohydrate throughout the day. It can make it easier to keep your blood sugar level within your target range. It also helps you see if you're eating healthy portion sizes.  To use the plate format, you put non-starchy vegetables on half your plate. Add meat or meat substitutes on one-quarter of the plate. Put a grain or starchy vegetable (such as brown rice or a potato) on the final quarter of the plate. You can add a small piece of fruit and some low-fat or fat-free milk or yogurt, depending on your carbohydrate goal for each meal.  Here are some tips for using the plate format:  ?? Make sure that you are not using an oversized plate. A 9-inch plate is best. Many restaurants use larger plates.  ?? Get used to using the plate format at home. Then you can use it when you eat out.  ?? Write down your questions about using the plate format. Talk to your doctor, a dietitian, or a diabetes educator about your concerns.   Carbohydrate counting  With carbohydrate counting, you plan meals based on the amount of carbohydrate in each food. Carbohydrate raises blood sugar higher and more quickly than any other nutrient. It is found in desserts, breads and cereals, and fruit. It's also found in starchy vegetables such as potatoes and corn, grains such as rice and pasta, and milk and yogurt. Spreading carbohydrate throughout the day helps keep your blood sugar levels within your target range.  Your daily amount depends on several things, including your weight, how active you are, which diabetes medicines you take, and what your goals are for your blood sugar levels. A registered dietitian or diabetes educator can help you plan how much carbohydrate to include in each meal and snack.  A guideline for your daily amount of carbohydrate is:  ?? 45 to 60 grams at each meal. That's about the same as 3 to 4 carbohydrate servings.  ?? 15 to 20 grams at each snack. That's about the same as 1 carbohydrate serving.  The Nutrition Facts label on packaged foods tells you how much carbohydrate is in a serving of the food. First, look at the serving size on the food label. Is that the amount you eat in a serving? All of the nutrition information on a food label is based on that serving size. So if you eat more or less than that, you'll need to adjust the other numbers. Total carbohydrate is the next thing you need to look for on the label. If you count carbohydrate servings, one serving of carbohydrate is 15 grams.  For foods that don't come with labels, such as fresh fruits and vegetables, you'll need a guide that lists carbohydrate in these foods. Ask your doctor, dietitian, or diabetes educator about books or other nutrition guides you can use.   If you take insulin, you need to know how many grams of carbohydrate are in a meal. This lets you know how much rapid-acting insulin to take before you eat. If you use an insulin pump, you get a constant rate of insulin during the day. So the pump must be programmed at meals to give you extra insulin to cover the rise in blood sugar after meals.  When you know how much carbohydrate you will eat, you can take the right amount of insulin. Or, if you always use the same amount of insulin, you need to make sure that you  eat the same amount of carbohydrate at meals.  If you need more help to understand carbohydrate counting and food labels, ask your doctor, dietitian, or diabetes educator.  How do you get started with meal planning?  Here are some tips to get started:  ?? Plan your meals a week at a time. Don't forget to include snacks too.  ?? Use cookbooks or online recipes to plan several main meals. Plan some quick meals for busy nights. You also can double some recipes that freeze well. Then you can save half for other busy nights when you don't have time to cook.  ?? Make sure you have the ingredients you need for your recipes. If you're running low on basic items, put these items on your shopping list too.  ?? List foods that you use to make breakfasts, lunches, and snacks. List plenty of fruits and vegetables.  ?? Post this list on the refrigerator. Add to it as you think of more things you need.  ?? Take the list to the store to do your weekly shopping.  Follow-up care is a key part of your treatment and safety. Be sure to make and go to all appointments, and call your doctor if you are having problems. It's also a good idea to know your test results and keep a list of the medicines you take.  Where can you learn more?  Go to http://clayton-rivera.info/  Enter [phone] in the search box to learn more about "Learning About Meal Planning for Diabetes."   Current as of: June 16, 2018??????????????????????????????Content Version: 12.6  ?? 2006-2020 Healthwise, [employer].   Care instructions adapted under license by [employer] (which disclaims liability or warranty for this information). If you have questions about a medical condition or this instruction, always ask your healthcare professional. [place] any warranty or liability for your use of this information.         Diabetes Blood Sugar Emergencies: Your Action Plan  How can you prevent a blood sugar emergency?  An important part of living with diabetes is keeping your blood sugar in your target range. You'll need to know what to do if it's too high or too low. Managing your blood sugar levels helps you avoid emergencies. This care sheet will teach you about the signs of high and low blood sugar. It will help you make an action plan with your doctor for when these signs occur.  Low blood sugar is more likely to happen if you take certain medicines for diabetes. It can also happen if you skip a meal, drink alcohol, or exercise more than usual.  You may get high blood sugar if you eat differently than you normally do. One example is eating more carbohydrate than usual. Having a cold, the flu, or other sudden illness can also cause high blood sugar levels. Levels can also rise if you miss a dose of medicine.  Any change in how you take your medicine may affect your blood sugar level. So it's important to work with your doctor before you make any changes.  Check your blood sugar  Work with your doctor to fill in the blank spaces below that apply to you.   Track your levels, know your target range, and write down ways you can get your blood sugar back in your target range. A log book can help you track your levels. Take the book to all of your medical appointments.   ?? Check your blood sugar _____ times  a day, at these times:________________________________________________.  (For example: Before meals, at bedtime, before exercise, during exercise, other.)  ?? Your blood sugar target range before a meal is ___________________. Your blood sugar target range after a meal is _______________________.  ?? Do this???___________________________________________________???to get your blood sugar back within your safe range if your blood sugar results are _________________________________________. (For example: Less than 70 or above 250 mg/dL.)  Call your doctor when your blood sugar results are ___________________________________.  (For example: Less than 70 or above 250 mg/dL.)  What are the symptoms of low and high blood sugar?  Common symptoms of low blood sugar are sweating and feeling shaky, weak, hungry, or confused. Symptoms can start quickly.  Common symptoms of high blood sugar are feeling very thirsty or very hungry. You may also pass urine more often than usual. You may have blurry vision and may lose weight without trying.  But some people may have high or low blood sugar without having any symptoms. That's a good reason to check your blood sugar on a regular schedule.  What should you do if you have symptoms?  Work with your doctor to fill in the blank spaces below that apply to you.   Low blood sugar  If you have symptoms of low blood sugar, check your blood sugar. If it's below _____ ( for example, below 70), eat or drink a quick-sugar food that has about 15 grams of carbohydrate. Your goal is to get your level back to your safe range. Check your blood sugar again 15 minutes later. If it's still not in your target range, take another 15 grams of carbohydrate and check your blood sugar again in 15 minutes. Repeat this until you reach your target. Then go back to your regular testing schedule.   Children usually need less than 15 grams of carbohydrate. Check with your doctor or diabetes educator for the amount that is right for your child.  When you have low blood sugar, it's best to stop or reduce any physical activity until your blood sugar is back in your target range and is stable. If you must stay active, eat or drink 30 grams of carbohydrate. Then check your blood sugar again in 15 minutes. If it's not in your target range, take another 30 grams of carbohydrates. Check your blood sugar again in 15 minutes. Keep doing this until you reach your target. You can then go back to your regular testing schedule.  If your symptoms or blood sugar levels are getting worse or have not improved after 15 minutes, seek medical care right away.   Here are some examples of quick-sugar foods with 15 grams of carbohydrate:  ?? 3 or 4 glucose tablets  ?? 1 tablespoon (3 teaspoons) table sugar  ?? ?? cup to ?? cup (4 to 6 ounces) of fruit juice or regular (not diet) soda  ?? Hard candy (such as 6 Life Savers)  High blood sugar  If you have symptoms of high blood sugar, check your blood sugar. Your goal is to get your level back to your target range. If it's above ______ ( for example, above 250), follow these steps:  ?? If you missed a dose of your diabetes medicine, take it now. Take only the amount of medicine that you have been prescribed. Do not take more or less medicine.  ?? Give yourself insulin if your doctor has prescribed it for high blood sugar.  ?? Test for ketones, if the doctor told you to  do so. If the results of the ketone test show a moderate-to-large amount of ketones, call the doctor for advice.  ?? Wait 30 minutes after you take the extra insulin or the missed medicine. Check your blood sugar again.  If your symptoms or blood sugar levels are getting worse or have not improved after taking these steps, seek medical care right away.    Follow-up care is a key part of your treatment and safety. Be sure to make and go to all appointments, and call your doctor if you are having problems. It's also a good idea to know your test results and keep a list of the medicines you take.  Where can you learn more?  Go to http://[facility]-rivera.info/  Enter [phone] in the search box to learn more about "Diabetes Blood Sugar Emergencies: Your Action Plan."  Current as of: June 16, 2018??????????????????????????????Content Version: 12.6  ?? 2006-2020 [employer].   Care instructions adapted under license by [employer] (which disclaims liability or warranty for this information). If you have questions about a medical condition or this instruction, always ask your healthcare professional. [place] any warranty or liability for your use of this information.         Learning About Diabetes and Exercise  Can you exercise if you have diabetes?  When you have diabetes, it's important to get regular exercise. This helps control your blood sugar level. You can still play sports, run, ride a bike, go swimming, and do other activities when you have diabetes.  How can exercise help you manage diabetes?  Your body turns the food you eat into glucose, a type of sugar. You need this sugar for energy. When you have diabetes, the sugar builds up in your blood. But when you exercise, your body uses sugar. This helps keep it from building up in your blood and results in lower blood sugar and better control of diabetes.  Exercise may help you in other ways too. It can help you reach and stay at a healthy weight. It also helps improve blood pressure and cholesterol, which can reduce the risk of heart disease.  Exercise can make you feel stronger and happier. It can help you relax and sleep better, and give you confidence in other things you do.  How can you exercise safely?   Before you start a new exercise program, talk to your doctor about how and when to exercise. You may need to have a medical exam and tests before you begin. Some types of exercise can be harmful if your diabetes is causing other problems, such as problems with your feet. Your doctor can tell you what types of exercise are good choices for you.  These tips can help you exercise safely when you have diabetes. If your diabetes is controlled by diet or medicine that doesn't lower your blood sugar, you don't need to eat a snack before you exercise.  ?? Check your blood sugar before you exercise. And be careful about what you eat.  ? If your blood sugar is less than 100, eat a carbohydrate snack before you exercise.  ? Be careful when you exercise if your blood sugar is over 300. High blood sugar can make you dehydrated. And that makes your blood sugar levels go even higher. If you have ketones in your blood or urine and your blood sugar is over 300, do not exercise.  ?? Don't try to do too much at first. Build up your exercise program  bit by bit. Try to get at least 30 minutes of exercise on most days of the week. Walking is a good choice. You also may want to do other activities, such as riding a bike or swimming. You might try running or gardening. Try to include muscle-strengthening exercises at least 2 times a week. These exercises include push-ups and weight training. You can also use rubber tubing or stretch bands. You stretch or pull the tubing or band to build muscle strength. If you want to exercise more, slowly increase how hard or long you exercise.  ?? You may get symptoms of low blood sugar during exercise or up to 24 hours later. Some symptoms of low blood sugar, such as sweating, a fast heartbeat, or feeling tired, can be confused with what can happen anytime you exercise. Other symptoms may include feeling anxious, dizzy, weak, or shaky. So it's a good idea to check your blood sugar again.   ?? You can treat low blood sugar by eating or drinking something that has 15 grams of carbohydrate. These should be quick-sugar foods. Quick-sugar foods such as glucose tablets, table sugar, honey, fruit juice, regular (not diet) soda pop, or hard candy can help raise blood sugar. Check your blood sugar level again 15 minutes after having a quick-sugar food to make sure your level is getting back to your target range.  ?? Drink plenty of water before, during, and after you exercise.  ?? Wear medical alert jewelry that says you have diabetes. You can buy this at most drugstores.  ?? Pay attention to your body. If you are used to exercise and notice that you can't do as much as usual, talk to your doctor.  Follow-up care is a key part of your treatment and safety. Be sure to make and go to all appointments, and call your doctor if you are having problems. It's also a good idea to know your test results and keep a list of the medicines you take.  Where can you learn more?  Go to http://[facility]ay[facility]-rivera.info/  Enter C492 in the search box to learn more about "Learning About Diabetes and Exercise."  Current as of: June 16, 2018??????????????????????????????Content Version: 12.6  ?? 2006-2020 [employer].   Care instructions adapted under license by [employer] (which disclaims liability or warranty for this information). If you have questions about a medical condition or this instruction, always ask your healthcare professional. [place] any warranty or liability for your use of this information.

## 2019-08-15 NOTE — Progress Notes (Signed)
William Blanchard.     Chief Complaint   Patient presents with   ??? Hospital Follow Up   ??? High Blood Sugar     have been running high since coming home from the hospital .      Vitals:    08/15/19 1305   BP: 107/65   Pulse: 67   Resp: 16   Temp: 97.3 ??F (36.3 ??C)   TempSrc: Temporal   SpO2: 100%   Weight: 130 lb (59 kg)   Height: '5\' 9"'$  (1.753 m)   PainSc:   6   PainLoc: Abdomen         HPI: Patient was admitted to the hospital  Admit Date: 07/30/2019  Length of stay: 1 day(s)  Discharge Date: August 01, 2019    Patient was admitted for abdominal pain abdominal distention diagnosis was ascites and he had paracentesis    A Division of [facility] [place], [facility]    GI Consultation    Assessment:     Active Problems:    1. Abdominal pain with distention, clinical ascites appreciated, r/o SBP. This is new per patient  - CT 2/1 - Cirrhosis, splenomegaly and large volume of ascites. There is suggestion of portal hypertension  2. CT findings of esophageal varices and suggestion of esophagitis. Patient denies GI bleed  - EGD 07/20/19, Dr. Hassell Done - Grade II esophageal varices. Completely eradicated. Banded. Gastritis. Portal hypertensive gastropathy. Normal examined duodenum. No specimens collected.  3. Hx NASH cirrhosis follows Dr. Hassell Done   - MELD-Na 6 (2/1)  - AST/ALT 80s/70s, ALP 550s, Tb 0.6  - Coag OK, platelet pending  4. Rt renal and bladder calculus by CT  5. Hx presumed self limiting diverticular bleed  ????????????????????????-??EGD/Colon 01/2018: Esophagus - Normal. ??No varices. Stomach - Mild gastritis. ??Mild portal gastropathy. ??Single nodule in the pre-pyloric area - bx'd then clipped. Duodenum - Normal. Colon: Left sided diverticulosis. ??Large internal hemorrhoids which are the most likely source for his bleeding           Patient has been doing well since he left the hospital blood sugar has been running slightly higher than normal and he is using a sliding scale   Blood sugar has been running high in the morning it 330 lowest 200     During the day 260  And lowest   160    He is taking regular insulin 5 units  In am for breakfast and lunch   4 units  ,Dinner he takes 5 units     During shopping he gets hypoglycemia symptom ( he took insulin 3;30 in am with breakfast and blood sugar dropped at 9;30       He is having abdominal pain and it gets worse when he takes the diuretics furosemide  And spiranolactone I advised the patient to take them separately  4 hours apart    Past Medical History:   Diagnosis Date   ??? Anemia    ??? Anxiety    ??? Chronic pain    ??? Cirrhosis of liver ([place])    ??? Diabetes ([place])    ??? GERD (gastroesophageal reflux disease)    ??? Hyperlipemia    ??? Hypertension    ??? Hypotension    ??? Migraine    ??? Other cirrhosis of liver ([place])    ??? Stroke [facility])     had 3 strokes    ??? Thrombocytopenia [facility])      Past Surgical  History:   Procedure Laterality Date   ??? HX CHOLECYSTECTOMY     ??? HX ORTHOPAEDIC      R hand sx   ??? HX OTHER SURGICAL      Hand surgery- Nail gun went through finger     Social History     Tobacco Use   ??? Smoking status: Former Smoker   ??? Smokeless tobacco: Never Used   ??? Tobacco comment: quit years ago   Substance Use Topics   ??? Alcohol use: No       Family History   Problem Relation Age of Onset   ??? Alzheimer Mother    ??? Diabetes Mother    ??? Cancer Mother         colon cancer    ??? Hypertension Father    ??? Heart Disease Father    ??? Cancer Father         prostate, lung cancer    ??? Diabetes Sister    ??? Heart Disease Sister    ??? Diabetes Brother        Review of Systems   Constitutional: Positive for malaise/fatigue. Negative for chills, fever and weight loss.   HENT: Negative for congestion, ear discharge, ear pain, hearing loss, nosebleeds, sinus pain and sore throat.    Eyes: Negative for blurred vision, double vision and discharge.   Respiratory: Negative for cough.    Cardiovascular: Negative for chest pain, palpitations, claudication and leg swelling.    Gastrointestinal: Positive for abdominal pain and nausea. Negative for constipation, diarrhea and vomiting.   Genitourinary: Negative for dysuria, frequency and urgency.   Musculoskeletal: Positive for back pain. Negative for myalgias.   Skin: Negative for itching and rash.   Neurological: Negative for dizziness, tingling, sensory change, speech change, focal weakness, weakness and headaches.   Psychiatric/Behavioral: Negative for depression, hallucinations, substance abuse and suicidal ideas. The patient is nervous/anxious.        Physical Exam  Constitutional:       General: He is not in acute distress.     Appearance: Normal appearance. He is well-developed. He is not diaphoretic.   HENT:      Head: Normocephalic and atraumatic.   Eyes:      General: No scleral icterus.        Right eye: No discharge.         Left eye: No discharge.   Neck:      Musculoskeletal: Normal range of motion and neck supple.      Thyroid: No thyromegaly.   Cardiovascular:      Rate and Rhythm: Normal rate and regular rhythm.      Heart sounds: Normal heart sounds.   Pulmonary:      Effort: Pulmonary effort is normal. No respiratory distress.      Breath sounds: Normal breath sounds. No rales.   Abdominal:      General: Bowel sounds are normal. There is no distension.      Palpations: Abdomen is soft. There is no mass.      Tenderness: There is no abdominal tenderness. There is no rebound.      Hernia: A hernia is present.      Comments: Patient has left inguinal hernia he is wearing a hernia belt and is tender to touch   Musculoskeletal: Normal range of motion.         General: No tenderness or deformity.   Lymphadenopathy:      Cervical: No cervical adenopathy.  Skin:     General: Skin is warm and dry.      Findings: Rash present. No erythema.      Comments: Bilateral lower extremity rash, it looks like bruising is actually it does blanch most likely secondary to the spider veins    Patient has good dorsalis pedis pulse   Also normal capillary refill   Neurological:      Mental Status: He is alert and oriented to person, place, and time.      Cranial Nerves: No cranial nerve deficit.      Coordination: Coordination normal.   Psychiatric:         Thought Content: Thought content normal.         Judgment: Judgment normal.          Assessment and plan     Plan of care has been discussed with the patient, he agrees to the plan and verbalized understanding.  All his questions were answered  More than 50% of the time spent in this visit was counseling the patient about  illness and treatment options         1. Protein-calorie malnutrition, unspecified severity ([place])  Monitor patient's weight    2. Controlled type 2 diabetes mellitus with complication, without long-term current use of insulin ([place])  Advised patient to count carbs information was given to him  Will have a virtual visit in 2 weeks to see if he need to be on basal insulin  - AMB POC GLUCOSE BLOOD, BY GLUCOSE MONITORING DEVICE    3. Esophageal varices without bleeding, unspecified esophageal varices type (H[facility])  Currently no bleeding he is following up with GI    4. 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 Essential hypertension  Well-controlled    6. Chronic back pain greater than 3 months duration  Patient is following up with pain management    7. Rectal bleeding  Secondary to hemorrhoids    8. Varices of esophagus determined by endoscopy ([place])  Currently stable with no bleeding  9. Pain of upper abdomen  Chronic upper abdominal pain he had endoscopy and CT scans with no pathology other than his esophageal varices    10. Other cirrhosis of liver ([facility])  Recently has ascites as paracentesis    Current Outpatient Medications   Medication Sig Dispense Refill   ??? furosemide (LASIX) 20 mg tablet Take 20 mg by mouth daily.     ??? spironolactone (ALDACTONE) 50 mg tablet Take 50 mg by mouth two (2) times a day.      ??? lactulose (CHRONULAC) 10 gram/15 mL solution Take  by mouth daily.     ??? folic acid/multivit-min/lutein (CENTRUM SILVER PO) Take  by mouth.     ??? cyanocobalamin (Vitamin B-12) 100 mcg tablet Take  by mouth daily.     ??? MAGNESIUM PO Take 1 Tab by mouth daily.     ??? metFORMIN (GLUCOPHAGE) 500 mg tablet Take 1 Tab by mouth two (2) times daily (with meals) for 90 days. 180 Tab 0   ??? propranoloL (INDERAL) 20 mg tablet Take 1 Tab by mouth two (2) times a day for 90 days. 180 Tab 1   ??? rosuvastatin (CRESTOR) 10 mg tablet TAKE 1 TABLET BY MOUTH NIGHTLY 90 Tab 1   ??? LORazepam (ATIVAN) 0.5 mg tablet Take 2 Tabs by mouth every eight (8) hours as needed for Anxiety for up to 30 days. Max Daily Amount: 3  mg. 180 Tab 0   ??? topiramate (TOPAMAX) 100 mg tablet TAKE 1 TABLET BY MOUTH DAILY 90 Tab 1   ??? finasteride (PROSCAR) 5 mg tablet TAKE 1 TABLET BY MOUTH DAILY 90 Tab 0   ??? sucralfate (CARAFATE) 1 gram tablet TAKE 1 TABLET BY MOUTH FOUR TIMES DAILY AT BEDTIME WITH MEALS 360 Tab 0   ??? glucose blood VI test strips (ASCENSIA AUTODISC VI, ONE TOUCH ULTRA TEST VI) strip Use to test blood sugars four times daily with Onetouch ultra 2. 400 Strip 3   ??? lancets (OneTouch UltraSoft Lancets) misc USE TO TEST BLOOD GLUCOSE FOUR TIMES DAILY 100 Each 5   ??? venlafaxine-Blanchard (Effexor XR) 150 mg capsule Take 150 mg by mouth daily.     ??? aspirin (ASPIRIN) 325 mg tablet Take 325 mg by mouth daily.     ??? HumuLIN R Regular U-100 Insuln 100 unit/mL injection INJECT AS NEEDED PER SLIDING SCALE (Patient taking differently: INJECT AS NEEDED PER SLIDING SCALE Max dose 20 units daily) 10 mL 3   ??? Insulin Syringe-Needle U-100 (BD INSULIN SYRINGE ULTRA-FINE) 0.3 mL 31 gauge x 5/16" syrg Use 4 times daily to inject insulin 400 Syringe 1   ??? Insulin Needles, Disposable, (BD ULTRA-FINE SHORT PEN NEEDLE) 31 gauge x 5/16" ndle Use 4 times daily to inject insulin. 1 Package 11    ??? Blood-Glucose Meter (ONETOUCH ULTRA2 METER) misc Use to check blood sugar BID 1 Each 0   ??? esomeprazole (NEXIUM) 20 mg capsule Take  by mouth daily.     ??? metoclopramide HCl (REGLAN) 10 mg tablet Take 10 mg by mouth Before breakfast, lunch, dinner and at bedtime.     ??? pramipexole (MIRAPEX) 0.125 mg tablet TAKE 1 TABLET BY MOUTH NIGHTLY 30 Tab 0   ??? oxyCODONE IR (ROXICODONE) 10 mg tab immediate release tablet Take 10 mg by mouth every six (6) hours as needed for Pain.     ??? naloxone (NARCAN) 4 mg/actuation nasal spray Use 1 spray intranasally into 1 nostril. Use a new Narcan nasal spray for subsequent doses and administer into alternating nostrils. May repeat every 2 to 3 minutes as needed for opioid overdose symptoms. 1 Each 0   ??? cholecalciferol, vitamin D3, (VITAMIN D3) 2,000 unit tab Take  by mouth.     ??? cyclobenzaprine (FLEXERIL) 10 mg tablet Take  by mouth three (3) times daily as needed for Muscle Spasm(s).     ??? propranoloL (INDERAL) 20 mg tablet TAKE 1 TABLET BY MOUTH TWICE DAILY 180 Tab 0   ??? prochlorperazine (COMPAZINE) 10 mg tablet Take 1 Tab by mouth every six (6) hours as needed for Nausea or Vomiting. 30 Tab 1   ??? lactulose (Generlac) 10 gram/15 mL solution Take 45 mL by mouth two (2) times a day for 90 days. 8100 mL 1   ??? Insulin Syringe-Needle U-100 1 mL 29 gauge x 1/2" syrg Three times per day and as needed as per sliding scale 100 Syringe 6       Patient Active Problem List    Diagnosis Date Noted   ??? Varices of esophagus determined by endoscopy ([place]) 03/15/2019   ??? Peripheral vascular disease ([place]) 02/21/2018   ??? Restless leg syndrome 12/04/2017   ??? Migraine without aura and without status migrainosus, not intractable 04/15/2017   ??? 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 Dermatitis 01/05/2017   ??? Benign prostatic hyperplasia (BPH) with straining on urination 12/31/2016   ??? 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 Myelodysplastic syndrome ([place]) 12/22/2016    ??? 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 Iron deficiency anemia secondary to inadequate dietary iron intake 09/23/2016   ??? Chronic leukopenia 09/23/2016   ??? Thrombocytopenia ([place]) 09/23/2016   ??? Moderate major depression ([place]) 09/10/2016   ??? Type II diabetes mellitus ([place]) 08/03/2016   ??? HTN (hypertension) 08/03/2016   ??? 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 Stroke ([place]) 08/03/2016   ??? Compressed vertebrae 08/03/2016   ??? Chronic back pain greater than 3 months duration 08/03/2016   ??? Hyperlipemia 08/03/2016   ??? Anxiety 08/03/2016   ??? Depression 06/30/2016   ??? Iron deficiency anemia 04/22/2016   ??? NAFLD (nonalcoholic fatty liver disease) 04/22/2016   ??? Other cirrhosis of liver ([place]) 04/22/2016   ??? Chronic anemia 03/18/2016   ??? Controlled type 2 diabetes mellitus without complication, without long-term current use of insulin ([place]) 03/18/2016   ??? Essential hypertension 03/18/2016   ??? Hyperlipidemia 03/18/2016   ??? History of stroke 03/18/2016   ??? Anxiety 03/18/2016   ??? Chronic pain 03/18/2016     Results for orders placed or performed in visit on 08/15/19   AMB POC GLUCOSE BLOOD, BY GLUCOSE MONITORING DEVICE   Result Value Ref Range    Glucose POC 87 mg/dL     Office Visit on 08/15/2019   Component Date Value Ref Range Status   ??? Glucose POC 08/15/2019 87  mg/dL Final   Hospital Outpatient Visit on 07/30/2019   Component Date Value Ref Range Status   ??? WBC 07/30/2019 5.5  4.6 - 13.2 K/uL Final   ??? RBC 07/30/2019 4.46* 4.70 - 5.50 M/uL Final   ??? HGB 07/30/2019 14.3  13.0 - 16.0 g/dL Final   ??? HCT 07/30/2019 43.6  36.0 - 48.0 % Final   ??? MCV 07/30/2019 97.8* 74.0 - 97.0 FL Final   ??? MCH 07/30/2019 32.1  24.0 - 34.0 PG Final   ??? MCHC 07/30/2019 32.8  31.0 - 37.0 g/dL Final   ??? RDW 07/30/2019 15.1* 11.6 - 14.5 % Final   ??? PLATELET 07/30/2019 100* 135 - 420 K/uL Final   ??? MPV 07/30/2019 10.8  9.2 - 11.8 FL Final   ??? NEUTROPHILS 07/30/2019 80* 40 - 73 % Final   ??? LYMPHOCYTES 07/30/2019 8* 21 - 52 % Final    ??? MONOCYTES 07/30/2019 9  3 - 10 % Final   ??? EOSINOPHILS 07/30/2019 3  0 - 5 % Final   ??? BASOPHILS 07/30/2019 0  0 - 2 % Final   ??? ABS. NEUTROPHILS 07/30/2019 4.4  1.8 - 8.0 K/UL Final   ??? ABS. LYMPHOCYTES 07/30/2019 0.4* 0.9 - 3.6 K/UL Final   ??? ABS. MONOCYTES 07/30/2019 0.5  0.05 - 1.2 K/UL Final   ??? ABS. EOSINOPHILS 07/30/2019 0.2  0.0 - 0.4 K/UL Final   ??? ABS. BASOPHILS 07/30/2019 0.0  0.0 - 0.1 K/UL Final   ??? DF 07/30/2019 AUTOMATED    Final   ??? Sodium 07/30/2019 142  136 - 145 mmol/L Final   ??? Potassium 07/30/2019 4.7  3.5 - 5.5 mmol/L Final   ??? Chloride 07/30/2019 109  100 - 111 mmol/L Final   ??? CO2 07/30/2019 28  21 - 32 mmol/L Final   ??? Anion gap 07/30/2019 5  3.0 - 18 mmol/L Final   ??? Glucose 07/30/2019 110* 74 - 99 mg/dL Final   ??? BUN 07/30/2019 12  7.0 -  18 MG/DL Final   ??? Creatinine 07/30/2019 0.94  0.6 - 1.3 MG/DL Final   ??? BUN/Creatinine ratio 07/30/2019 13  12 - 20   Final   ??? GFR est AA 07/30/2019 >60  >60 ml/min/1.8m Final   ??? GFR est non-AA 07/30/2019 >60  >60 ml/min/1.723mFinal    Comment: (NOTE)  Estimated GFR is calculated using the Modification of Diet in Renal   Disease (MDRD) Study equation, reported for both African Americans   (GFRAA) and non-African Americans (GFRNA), and normalized to 1.7358m body surface area. The physician must decide which value applies to   the patient. The MDRD study equation should only be used in   individuals age 47 33 older. It has not been validated for the   following: pregnant women, patients with serious comorbid conditions,   or on certain medications, or persons with extremes of body size,   muscle mass, or nutritional status.     ??? Calcium 07/30/2019 9.0  8.5 - 10.1 MG/DL Final   ??? Bilirubin, total 07/30/2019 0.6  0.2 - 1.0 MG/DL Final   ??? ALT (SGPT) 07/30/2019 96* 16 - 61 U/L Final   ??? AST (SGOT) 07/30/2019 89* 10 - 38 U/L Final   ??? Alk. phosphatase 07/30/2019 567* 45 - 117 U/L Final   ??? Protein, total 07/30/2019 7.3  6.4 - 8.2 g/dL Final    ??? Albumin 07/30/2019 3.8  3.4 - 5.0 g/dL Final   ??? Globulin 07/30/2019 3.5  2.0 - 4.0 g/dL Final   ??? A-G Ratio 07/30/2019 1.1  0.8 - 1.7   Final   ??? Ferritin 07/30/2019 252  8 - 388 NG/ML Final   ??? Iron 07/30/2019 46* 50 - 175 ug/dL Final    Patients receiving metal-binding drugs (e.g. deferoxamine) may show spuriously depressed iron values, as chelated iron may not properly react in the iron assay.   ??? TIBC 07/30/2019 295  250 - 450 ug/dL Final   ??? Iron % saturation 07/30/2019 16* 20 - 50 % Final   Hospital Outpatient Visit on 07/04/2019   Component Date Value Ref Range Status   ??? WBC 07/04/2019 4.5* 4.6 - 13.2 K/uL Final   ??? RBC 07/04/2019 4.35* 4.70 - 5.50 M/uL Final   ??? HGB 07/04/2019 14.0  13.0 - 16.0 g/dL Final   ??? HCT 07/04/2019 43.8  36.0 - 48.0 % Final   ??? MCV 07/04/2019 100.7* 74.0 - 97.0 FL Final   ??? MCH 07/04/2019 32.2  24.0 - 34.0 PG Final   ??? MCHC 07/04/2019 32.0  31.0 - 37.0 g/dL Final   ??? RDW 07/04/2019 16.5* 11.6 - 14.5 % Final   ??? PLATELET 07/04/2019 99* 135 - 420 K/uL Final   ??? MPV 07/04/2019 11.4  9.2 - 11.8 FL Final   ??? NEUTROPHILS 07/04/2019 82* 40 - 73 % Final   ??? LYMPHOCYTES 07/04/2019 9* 21 - 52 % Final   ??? MONOCYTES 07/04/2019 6  3 - 10 % Final   ??? EOSINOPHILS 07/04/2019 3  0 - 5 % Final   ??? BASOPHILS 07/04/2019 0  0 - 2 % Final   ??? ABS. NEUTROPHILS 07/04/2019 3.6  1.8 - 8.0 K/UL Final   ??? ABS. LYMPHOCYTES 07/04/2019 0.4* 0.9 - 3.6 K/UL Final   ??? ABS. MONOCYTES 07/04/2019 0.3  0.05 - 1.2 K/UL Final   ??? ABS. EOSINOPHILS 07/04/2019 0.1  0.0 - 0.4 K/UL Final   ??? ABS. BASOPHILS 07/04/2019 0.0  0.0 -  0.1 K/UL Final   ??? DF 07/04/2019 AUTOMATED    Final   ??? Sodium 07/04/2019 138  136 - 145 mmol/L Final   ??? Potassium 07/04/2019 4.9  3.5 - 5.5 mmol/L Final   ??? Chloride 07/04/2019 108  100 - 111 mmol/L Final   ??? CO2 07/04/2019 20* 21 - 32 mmol/L Final   ??? Anion gap 07/04/2019 10  3.0 - 18 mmol/L Final   ??? Glucose 07/04/2019 125* 74 - 99 mg/dL Final   ??? BUN 07/04/2019 14  7.0 - 18 MG/DL Final    ??? Creatinine 07/04/2019 0.93  0.6 - 1.3 MG/DL Final   ??? BUN/Creatinine ratio 07/04/2019 15  12 - 20   Final   ??? GFR est AA 07/04/2019 >60  >60 ml/min/1.46m Final   ??? GFR est non-AA 07/04/2019 >60  >60 ml/min/1.715mFinal    Comment: (NOTE)  Estimated GFR is calculated using the Modification of Diet in Renal   Disease (MDRD) Study equation, reported for both African Americans   (GFRAA) and non-African Americans (GFRNA), and normalized to 1.7366m body surface area. The physician must decide which value applies to   the patient. The MDRD study equation should only be used in   individuals age 69 65 older. It has not been validated for the   following: pregnant women, patients with serious comorbid conditions,   or on certain medications, or persons with extremes of body size,   muscle mass, or nutritional status.     ??? Calcium 07/04/2019 9.7  8.5 - 10.1 MG/DL Final   ??? Bilirubin, total 07/04/2019 0.9  0.2 - 1.0 MG/DL Final   ??? ALT (SGPT) 07/04/2019 62* 16 - 61 U/L Final   ??? AST (SGOT) 07/04/2019 74* 10 - 38 U/L Final   ??? Alk. phosphatase 07/04/2019 338* 45 - 117 U/L Final   ??? Protein, total 07/04/2019 8.0  6.4 - 8.2 g/dL Final   ??? Albumin 07/04/2019 4.4  3.4 - 5.0 g/dL Final   ??? Globulin 07/04/2019 3.6  2.0 - 4.0 g/dL Final   ??? A-G Ratio 07/04/2019 1.2  0.8 - 1.7   Final   ??? Iron 07/04/2019 73  50 - 175 ug/dL Final    Patients receiving metal-binding drugs (e.g. deferoxamine) may show spuriously depressed iron values, as chelated iron may not properly react in the iron assay.   ??? TIBC 07/04/2019 365  250 - 450 ug/dL Final   ??? Iron % saturation 07/04/2019 20  20 - 50 % Final   ??? Ferritin 07/04/2019 462* 8 - 388 NG/ML Final   Hospital Outpatient Visit on 06/11/2019   Component Date Value Ref Range Status   ??? Sodium 06/11/2019 141  136 - 145 mmol/L Final   ??? Potassium 06/11/2019 4.8  3.5 - 5.5 mmol/L Final   ??? Chloride 06/11/2019 110  100 - 111 mmol/L Final   ??? CO2 06/11/2019 23  21 - 32 mmol/L Final    ??? Anion gap 06/11/2019 8  3.0 - 18 mmol/L Final   ??? Glucose 06/11/2019 87  74 - 99 mg/dL Final   ??? BUN 06/11/2019 9  7.0 - 18 MG/DL Final   ??? Creatinine 06/11/2019 0.84  0.6 - 1.3 MG/DL Final   ??? BUN/Creatinine ratio 06/11/2019 11* 12 - 20   Final   ??? GFR est AA 06/11/2019 >60  >60 ml/min/1.30m58mnal   ??? GFR est non-AA 06/11/2019 >60  >60 ml/min/1.30m222mal    Comment: (NOTE)  Estimated GFR is calculated using the Modification of Diet in Renal   Disease (MDRD) Study equation, reported for both African Americans   (GFRAA) and non-African Americans (GFRNA), and normalized to 1.21m   body surface area. The physician must decide which value applies to   the patient. The MDRD study equation should only be used in   individuals age 6159or older. It has not been validated for the   following: pregnant women, patients with serious comorbid conditions,   or on certain medications, or persons with extremes of body size,   muscle mass, or nutritional status.     ??? Calcium 06/11/2019 9.3  8.5 - 10.1 MG/DL Final   ??? Bilirubin, total 06/11/2019 0.7  0.2 - 1.0 MG/DL Final   ??? ALT (SGPT) 06/11/2019 36  16 - 61 U/L Final   ??? AST (SGOT) 06/11/2019 37  10 - 38 U/L Final   ??? Alk. phosphatase 06/11/2019 260* 45 - 117 U/L Final   ??? Protein, total 06/11/2019 7.7  6.4 - 8.2 g/dL Final   ??? Albumin 06/11/2019 4.2  3.4 - 5.0 g/dL Final   ??? Globulin 06/11/2019 3.5  2.0 - 4.0 g/dL Final   ??? A-G Ratio 06/11/2019 1.2  0.8 - 1.7   Final   ??? Hemoglobin A1c 06/11/2019 5.4  4.2 - 5.6 % Final    Comment: (NOTE)  HbA1C Interpretive Ranges  <5.7              Normal  5.7 - 6.4         Consider Prediabetes  >6.5              Consider Diabetes     ??? WBC 06/11/2019 4.8  4.6 - 13.2 K/uL Final   ??? RBC 06/11/2019 4.01* 4.70 - 5.50 M/uL Final   ??? HGB 06/11/2019 12.9* 13.0 - 16.0 g/dL Final   ??? HCT 06/11/2019 40.0  36.0 - 48.0 % Final   ??? MCV 06/11/2019 99.8* 74.0 - 97.0 FL Final   ??? MCH 06/11/2019 32.2  24.0 - 34.0 PG Final    ??? MCHC 06/11/2019 32.3  31.0 - 37.0 g/dL Final   ??? RDW 06/11/2019 16.8* 11.6 - 14.5 % Final   ??? PLATELET 06/11/2019 105* 135 - 420 K/uL Final   ??? MPV 06/11/2019 10.7  9.2 - 11.8 FL Final   ??? NEUTROPHILS 06/11/2019 79* 40 - 73 % Final   ??? LYMPHOCYTES 06/11/2019 9* 21 - 52 % Final   ??? MONOCYTES 06/11/2019 10  3 - 10 % Final   ??? EOSINOPHILS 06/11/2019 2  0 - 5 % Final   ??? BASOPHILS 06/11/2019 0  0 - 2 % Final   ??? ABS. NEUTROPHILS 06/11/2019 3.7  1.8 - 8.0 K/UL Final   ??? ABS. LYMPHOCYTES 06/11/2019 0.4* 0.9 - 3.6 K/UL Final   ??? ABS. MONOCYTES 06/11/2019 0.5  0.05 - 1.2 K/UL Final   ??? ABS. EOSINOPHILS 06/11/2019 0.1  0.0 - 0.4 K/UL Final   ??? ABS. BASOPHILS 06/11/2019 0.0  0.0 - 0.1 K/UL Final   ??? DF 06/11/2019 AUTOMATED    Final          Follow-up and Dispositions    ?? Return in about 2 weeks (around 08/29/2019).

## 2019-08-16 ENCOUNTER — Encounter

## 2019-08-16 ENCOUNTER — Inpatient Hospital Stay: Payer: MEDICARE | Primary: Legal Medicine

## 2019-08-16 NOTE — Telephone Encounter (Signed)
Requested Prescriptions     Pending Prescriptions Disp Refills   ??? Blood-Glucose Meter (OneTouch Ultra2 Meter) misc 1 Each 0     Sig: Use to check blood sugar BID       Pt's wife called to request a script for a new meter to be sent in because their meter is reading real high however his levels were normal when he came in for visit yesterday. Please advise.

## 2019-08-17 ENCOUNTER — Inpatient Hospital Stay: Admit: 2019-08-17 | Payer: MEDICARE | Primary: Legal Medicine

## 2019-08-17 DIAGNOSIS — D464 Refractory anemia, unspecified: Secondary | ICD-10-CM

## 2019-08-17 MED ORDER — SODIUM CHLORIDE 0.9 % IJ SYRG
INTRAMUSCULAR | Status: DC | PRN
Start: 2019-08-17 — End: 2019-08-18
  Administered 2019-08-17 (×2): via INTRAVENOUS

## 2019-08-17 MED ORDER — FERRIC CARBOXYMALTOSE 50 MG IRON/ML INTRAVENOUS SOLUTION
50 iron mg/mL | Freq: Once | INTRAVENOUS | Status: AC
Start: 2019-08-17 — End: 2019-08-17
  Administered 2019-08-17: 18:00:00 via INTRAVENOUS

## 2019-08-17 MED ORDER — SODIUM CHLORIDE 0.9 % IV
INTRAVENOUS | Status: DC
Start: 2019-08-17 — End: 2019-08-18
  Administered 2019-08-17: 18:00:00 via INTRAVENOUS

## 2019-08-17 MED FILL — SODIUM CHLORIDE 0.9 % IV: INTRAVENOUS | Qty: 1000

## 2019-08-17 MED FILL — BD POSIFLUSH NORMAL SALINE 0.9 % INJECTION SYRINGE: INTRAMUSCULAR | Qty: 10

## 2019-08-17 MED FILL — INJECTAFER 50 MG IRON/ML INTRAVENOUS SOLUTION: 50 mg iron/mL | INTRAVENOUS | Qty: 15

## 2019-08-17 NOTE — Progress Notes (Signed)
[facility] Progress Note    Date: August 17, 2019    Name: William Blanchard [facility] Sr.    MRN: [id number]         DOB: 04-11-1946    Injectafer 1/2    William Blanchard was assessed and education was provided. Pt has had Injectafer in the past and tolerated it with no complaints.    William Blanchard vitals were reviewed and patient was observed for 5 minutes prior to treatment.   Visit Vitals  BP 115/66 (BP 1 Location: Right upper arm, BP Patient Position: At rest;Sitting)   Pulse 70   Temp 98.4 ??F (36.9 ??C)   Resp 16   SpO2 100%       Dose #1 of 2 Injectafer 750 mg IV mixed with 250 ml NS given over approx 20 min via peripheral line.    William Blanchard tolerated the infusion, and had no complaints.  Patient armband removed and shredded.    William Blanchard poliltely declined staying for observation after infusion.    William Blanchard was discharged from [place] in stable condition at 1345. His next [facility] appt is 08/24/2019 at 1300 for Injectafer #2 of 2.    Lynnda Shields, RN  August 17, 2019

## 2019-08-22 ENCOUNTER — Institutional Professional Consult (permissible substitution): Admit: 2019-08-22 | Discharge: 2019-08-22 | Payer: MEDICARE | Attending: Legal Medicine | Primary: Legal Medicine

## 2019-08-22 DIAGNOSIS — E118 Type 2 diabetes mellitus with unspecified complications: Secondary | ICD-10-CM

## 2019-08-22 LAB — AMB POC GLUCOSE, QUANTITATIVE, BLOOD: Glucose POC: 131 mg/dL

## 2019-08-22 NOTE — Progress Notes (Signed)
Patient was suppose to bring his meter but he did not bring it today to see if it was a big difference.

## 2019-08-23 ENCOUNTER — Ambulatory Visit: Payer: MEDICARE | Primary: Legal Medicine

## 2019-08-24 ENCOUNTER — Inpatient Hospital Stay: Admit: 2019-08-24 | Payer: MEDICARE | Primary: Legal Medicine

## 2019-08-24 DIAGNOSIS — D464 Refractory anemia, unspecified: Secondary | ICD-10-CM

## 2019-08-24 MED ORDER — SODIUM CHLORIDE 0.9 % IV
INTRAVENOUS | Status: DC
Start: 2019-08-24 — End: 2019-08-25
  Administered 2019-08-24: 18:00:00 via INTRAVENOUS

## 2019-08-24 MED ORDER — SODIUM CHLORIDE 0.9 % IJ SYRG
INTRAMUSCULAR | Status: DC | PRN
Start: 2019-08-24 — End: 2019-08-25
  Administered 2019-08-24: 19:00:00 via INTRAVENOUS

## 2019-08-24 MED ORDER — FERRIC CARBOXYMALTOSE 50 MG IRON/ML INTRAVENOUS SOLUTION
50 iron mg/mL | Freq: Once | INTRAVENOUS | Status: AC
Start: 2019-08-24 — End: 2019-08-24
  Administered 2019-08-24: 18:00:00 via INTRAVENOUS

## 2019-08-24 MED FILL — SODIUM CHLORIDE 0.9 % IV: INTRAVENOUS | Qty: 1000

## 2019-08-24 MED FILL — BD POSIFLUSH NORMAL SALINE 0.9 % INJECTION SYRINGE: INTRAMUSCULAR | Qty: 10

## 2019-08-24 MED FILL — INJECTAFER 50 MG IRON/ML INTRAVENOUS SOLUTION: 50 mg iron/mL | INTRAVENOUS | Qty: 15

## 2019-08-24 NOTE — Progress Notes (Signed)
[facility] Progress Note    Date: August 24, 2019    Name: William Galicia [facility] Sr.    MRN: [id number]         DOB: 08-Jul-1945    Injectafer 2/2    William Blanchard was assessed and education was provided. Pt has had Injectafer in the past and tolerated it with no complaints.    William Blanchard vitals were reviewed and patient was observed for 5 minutes prior to treatment.   Visit Vitals  BP 112/68 (BP 1 Location: Right upper arm, BP Patient Position: Sitting)   Pulse 78   Temp 97.9 ??F (36.6 ??C)   Resp 16   SpO2 98%       PIV #24g to right AC x 1 attempt. Brisk blood return/flushes well.    Injectafer 750 mg IV mixed with 250 ml NS given over approx 20 min via peripheral line.    William Blanchard tolerated the infusion, and had no complaints.    Patient armband removed and shredded.    William Blanchard was discharged from [place] in stable condition at 1350. He has no further appointments scheduled with [facility] at this time.     Loyal Jacobson, RN  August 24, 2019

## 2019-09-11 NOTE — Telephone Encounter (Signed)
Melissa with [employer] called on behalf of the patient, his lorazepam medication is being rejection because they are saying that it is not qualified under Medicare D for ERSD issues. There are no records that the patien has these issues.     She states we need to call the RX helpline to sort out the diagnosis. Phone [phone]

## 2019-09-11 NOTE — Telephone Encounter (Signed)
They stated that patient needs an prior auth done. It will take up to 10 mins before it comes through the fax.

## 2019-09-12 ENCOUNTER — Encounter

## 2019-09-12 NOTE — Telephone Encounter (Signed)
LM for patient to schedule appointment

## 2019-09-12 NOTE — Telephone Encounter (Signed)
Need evaluation for refill

## 2019-09-15 ENCOUNTER — Encounter

## 2019-09-15 MED ORDER — LANCETS
5 refills | Status: DC
Start: 2019-09-15 — End: 2019-11-07

## 2019-09-19 NOTE — Telephone Encounter (Signed)
Dr. Phineas Douglas stated patient needed a follow up for refill. Prior auth completed and faxed.

## 2019-10-01 ENCOUNTER — Encounter

## 2019-10-01 MED ORDER — FINASTERIDE 5 MG TAB
5 mg | ORAL_TABLET | ORAL | 0 refills | Status: DC
Start: 2019-10-01 — End: 2020-01-02

## 2019-10-01 MED ORDER — SUCRALFATE 1 GRAM TAB
1 gram | ORAL_TABLET | ORAL | 0 refills | Status: DC
Start: 2019-10-01 — End: 2020-01-07

## 2019-10-01 MED ORDER — METFORMIN 500 MG TAB
500 mg | ORAL_TABLET | ORAL | 0 refills | Status: DC
Start: 2019-10-01 — End: 2020-01-07

## 2019-10-05 ENCOUNTER — Encounter

## 2019-10-08 MED ORDER — INSULIN SYRINGE U-100 WITH NEEDLE 0.3 ML 31 GAUGE X 5/16"
0.3 mL 31 gauge x 5/16" | INJECTION | 5 refills | Status: AC
Start: 2019-10-08 — End: ?

## 2019-11-02 ENCOUNTER — Inpatient Hospital Stay: Admit: 2019-11-02 | Payer: MEDICARE | Primary: Legal Medicine

## 2019-11-02 ENCOUNTER — Other Ambulatory Visit: Admit: 2019-11-02 | Discharge: 2019-11-02 | Payer: MEDICARE | Primary: Legal Medicine

## 2019-11-02 DIAGNOSIS — D508 Other iron deficiency anemias: Secondary | ICD-10-CM

## 2019-11-02 LAB — METABOLIC PANEL, COMPREHENSIVE
A-G Ratio: 1.1 (ref 0.8–1.7)
ALT (SGPT): 87 U/L — ABNORMAL HIGH (ref 16–61)
AST (SGOT): 57 U/L — ABNORMAL HIGH (ref 10–38)
Albumin: 4 g/dL (ref 3.4–5.0)
Alk. phosphatase: 434 U/L — ABNORMAL HIGH (ref 45–117)
Anion gap: 8 mmol/L (ref 3.0–18)
BUN/Creatinine ratio: 19 (ref 12–20)
BUN: 20 MG/DL — ABNORMAL HIGH (ref 7.0–18)
Bilirubin, total: 0.6 MG/DL (ref 0.2–1.0)
CO2: 25 mmol/L (ref 21–32)
Calcium: 9.5 MG/DL (ref 8.5–10.1)
Chloride: 103 mmol/L (ref 100–111)
Creatinine: 1.08 MG/DL (ref 0.6–1.3)
GFR est AA: >60 mL/min/{1.73_m2} (ref >60)
GFR est non-AA: >60 mL/min/{1.73_m2} (ref >60)
Globulin: 3.6 g/dL (ref 2.0–4.0)
Glucose: 132 mg/dL — ABNORMAL HIGH (ref 74–99)
Potassium: 4.2 mmol/L (ref 3.5–5.5)
Protein, total: 7.6 g/dL (ref 6.4–8.2)
Sodium: 136 mmol/L (ref 136–145)

## 2019-11-02 LAB — IRON PROFILE
Iron % saturation: 19 % — ABNORMAL LOW (ref 20–50)
Iron: 55 ug/dL (ref 50–175)
TIBC: 283 ug/dL (ref 250–450)

## 2019-11-02 LAB — CBC WITH AUTOMATED DIFF
ABS. BASOPHILS: 0.1 10*3/uL (ref 0.0–0.1)
ABS. EOSINOPHILS: 0.1 10*3/uL (ref 0.0–0.4)
ABS. LYMPHOCYTES: 0.3 10*3/uL — ABNORMAL LOW (ref 0.9–3.6)
ABS. MONOCYTES: 0.4 10*3/uL (ref 0.05–1.2)
ABS. NEUTROPHILS: 6.9 10*3/uL (ref 1.8–8.0)
BASOPHILS: 1 % (ref 0–2)
EOSINOPHILS: 1 % (ref 0–5)
HCT: 39.1 % (ref 36.0–48.0)
HGB: 12.6 g/dL — ABNORMAL LOW (ref 13.0–16.0)
LYMPHOCYTES: 4 % — ABNORMAL LOW (ref 21–52)
MCH: 32.5 PG (ref 24.0–34.0)
MCHC: 32.2 g/dL (ref 31.0–37.0)
MCV: 100.8 FL — ABNORMAL HIGH (ref 74.0–97.0)
MONOCYTES: 6 % (ref 3–10)
MPV: 10.7 FL (ref 9.2–11.8)
NEUTROPHILS: 88 % — ABNORMAL HIGH (ref 40–73)
PLATELET: 96 10*3/uL — ABNORMAL LOW (ref 135–420)
RBC: 3.88 M/uL — ABNORMAL LOW (ref 4.35–5.65)
RDW: 14.6 % — ABNORMAL HIGH (ref 11.6–14.5)
WBC: 7.9 10*3/uL (ref 4.6–13.2)

## 2019-11-02 LAB — FERRITIN: Ferritin: 443 NG/ML — ABNORMAL HIGH (ref 8–388)

## 2019-11-05 NOTE — Telephone Encounter (Signed)
Lvm for patient informing him that both his and his wife's appt were cancelled tomorrow due to a scheduling error. I asked him to call back to r/s the appt and that we would send doctor elhassan a temp refill request for him ativan. We just need to hear back from him.

## 2019-11-06 ENCOUNTER — Encounter

## 2019-11-06 ENCOUNTER — Encounter: Attending: Legal Medicine | Primary: Legal Medicine

## 2019-11-06 NOTE — Telephone Encounter (Signed)
Pt wife called to confirm the pharmacy and reschedule VV appt.     Future Appointments   Date Time Provider [place]   11/16/2019 11:15 AM Do, Ena Dawley, MD [facility] BS AMB   11/20/2019 11:45 AM [name], MD [facility] BS AMB       Requested Prescriptions     Pending Prescriptions Disp Refills   ??? lancets (OneTouch UltraSoft Lancets) misc [Pharmacy Med Name: ONE TOUCH ULTRASOFT LANCETS 100'S] 100 Each 5     Sig: USE TO TEST BLOOD GLUCOSE FOUR TIMES DAILY   ??? LORazepam (ATIVAN) 0.5 mg tablet 180 Tab 0     Sig: Take 2 Tabs by mouth every eight (8) hours as needed for Anxiety for up to 30 days. Max Daily Amount: 3 mg.

## 2019-11-07 MED ORDER — LANCETS
5 refills | Status: AC
Start: 2019-11-07 — End: ?

## 2019-11-07 MED ORDER — LORAZEPAM 0.5 MG TAB
0.5 mg | ORAL_TABLET | Freq: Two times a day (BID) | ORAL | 0 refills | Status: DC | PRN
Start: 2019-11-07 — End: 2020-03-27

## 2019-11-07 NOTE — Telephone Encounter (Signed)
Ativan should be for 90 days !!!

## 2019-11-08 NOTE — Telephone Encounter (Signed)
Wife notified

## 2019-11-16 ENCOUNTER — Ambulatory Visit: Admit: 2019-11-16 | Discharge: 2019-11-16 | Payer: MEDICARE | Attending: Internal Medicine | Primary: Legal Medicine

## 2019-11-16 DIAGNOSIS — D508 Other iron deficiency anemias: Secondary | ICD-10-CM

## 2019-11-16 NOTE — Progress Notes (Signed)
Hematology/Oncology  Progress Note    Name: William Barcelo Sr.  Date: 11/16/2019  DOB: 1945-07-11    PCP: [name], MD     William Blanchard is a 74 y.o.-year-old man who has chronic leukopenia, and anemia.      Subjective:     William Blanchard is a 74 year old man who has anemia.  He also has chronic leukopenia and thrombocytopenia.  Patient was being followed by Dr. Nadyne Coombes who retired. He has been offered Procrit 60,000 units every 2 weeks when the hemoglobin is below 10 g/dL and hematocrit is below 30%.   He said he is feeling well after IV iron.  He noted occasional mild rectal bleeding from hemorrhoids which is being follow-up by his GI.  He denied any acute bleeding, blood in stool, melena, bruising at this point.  He denies having fever, chills, dizziness, rashes, lymphadenopathy. He has no weakness and fatigue. He said he is now  sleeping well at night since he bought a new mattress. He has no complaints or concerns at this time.    Past medical history, family history, and social history: these were reviewed and remains unchanged.    Past Medical History:   Diagnosis Date   ??? Anemia    ??? Anxiety    ??? Chronic pain    ??? Cirrhosis of liver ([place])    ??? Diabetes ([place])    ??? GERD (gastroesophageal reflux disease)    ??? Hyperlipemia    ??? Hypertension    ??? Hypotension    ??? Migraine    ??? Other cirrhosis of liver ([place])    ??? Stroke [facility])     had 3 strokes    ??? Thrombocytopenia ([place])      Past Surgical History:   Procedure Laterality Date   ??? HX CHOLECYSTECTOMY     ??? HX ORTHOPAEDIC      R hand sx   ??? HX OTHER SURGICAL      Hand surgery- Nail gun went through finger     Social History     Socioeconomic History   ??? Marital status: MARRIED     Spouse name: Not on file   ??? Number of children: Not on file   ??? Years of education: Not on file   ??? Highest education level: Not on file   Occupational History   ??? Not on file   Tobacco Use   ??? Smoking status: Former Smoker   ??? Smokeless tobacco: Never Used   ??? Tobacco comment: quit  years ago   Substance and Sexual Activity   ??? Alcohol use: No   ??? Drug use: No   ??? Sexual activity: Never     Partners: Male   Other Topics Concern   ??? Not on file   Social History Narrative    ** Merged History Encounter **          Social Determinants of Health     Financial Resource Strain:    ??? Difficulty of Paying Living Expenses:    Food Insecurity:    ??? Worried About Charity fundraiser in the Last Year:    ??? Arboriculturist in the Last Year:    Transportation Needs:    ??? Film/video editor (Medical):    ??? Lack of Transportation (Non-Medical):    Physical Activity:    ??? Days of Exercise per Week:    ??? Minutes of Exercise per Session:    Stress:    ???  Feeling of Stress :    Social Connections:    ??? Frequency of Communication with Friends and Family:    ??? Frequency of Social Gatherings with Friends and Family:    ??? Attends Religious Services:    ??? Marine scientist or Organizations:    ??? Attends Music therapist:    ??? Marital Status:    Intimate Production manager Violence:    ??? Fear of Current or Ex-Partner:    ??? Emotionally Abused:    ??? Physically Abused:    ??? Sexually Abused:      Family History   Problem Relation Age of Onset   ??? Alzheimer Mother    ??? Diabetes Mother    ??? Cancer Mother         colon cancer    ??? Hypertension Father    ??? Heart Disease Father    ??? Cancer Father         prostate, lung cancer    ??? Diabetes Sister    ??? Heart Disease Sister    ??? Diabetes Brother      Current Outpatient Medications   Medication Sig Dispense Refill   ??? riboflavin, vitamin B2, 100 mg tablet TAKE 4 TABLETS BY MOUTH EVERY DAY WITH FOOD     ??? lancets (OneTouch UltraSoft Lancets) misc USE TO TEST BLOOD GLUCOSE FOUR TIMES DAILY 100 Each 5   ??? LORazepam (ATIVAN) 0.5 mg tablet Take 2 Tabs by mouth two (2) times daily as needed for Anxiety for up to 90 days. Max Daily Amount: 2 mg. 180 Tab 0   ??? Insulin Syringe-Needle U-100 0.3 mL 31 gauge x 5/16" syrg USE 4 TIMES DAILY TO INJECT INSULIN 200 Syringe 5   ??? metFORMIN  (GLUCOPHAGE) 500 mg tablet TAKE 1 TABLET BY MOUTH TWICE DAILY WITH MEALS 180 Tab 0   ??? finasteride (PROSCAR) 5 mg tablet TAKE 1 TABLET BY MOUTH DAILY 90 Tab 0   ??? sucralfate (CARAFATE) 1 gram tablet TAKE 1 TABLET BY MOUTH FOUR TIMES DAILY AT BEDTIME WITH MEALS 360 Tab 0   ??? furosemide (LASIX) 20 mg tablet Take 20 mg by mouth daily.     ??? spironolactone (ALDACTONE) 50 mg tablet Take 50 mg by mouth two (2) times a day.     ??? lactulose (CHRONULAC) 10 gram/15 mL solution Take  by mouth daily.     ??? folic acid/multivit-min/lutein (CENTRUM SILVER PO) Take  by mouth.     ??? cyanocobalamin (Vitamin B-12) 100 mcg tablet Take  by mouth daily.     ??? MAGNESIUM PO Take 1 Tab by mouth daily.     ??? propranoloL (INDERAL) 20 mg tablet Take 1 Tab by mouth two (2) times a day for 90 days. 180 Tab 1   ??? rosuvastatin (CRESTOR) 10 mg tablet TAKE 1 TABLET BY MOUTH NIGHTLY 90 Tab 1   ??? topiramate (TOPAMAX) 100 mg tablet TAKE 1 TABLET BY MOUTH DAILY 90 Tab 1   ??? glucose blood VI test strips (ASCENSIA AUTODISC VI, ONE TOUCH ULTRA TEST VI) strip Use to test blood sugars four times daily with Onetouch ultra 2. 400 Strip 3   ??? propranoloL (INDERAL) 20 mg tablet TAKE 1 TABLET BY MOUTH TWICE DAILY 180 Tab 0   ??? prochlorperazine (COMPAZINE) 10 mg tablet Take 1 Tab by mouth every six (6) hours as needed for Nausea or Vomiting. 30 Tab 1   ??? venlafaxine-SR (Effexor XR) 150 mg capsule Take 150 mg by  mouth daily.     ??? aspirin (ASPIRIN) 325 mg tablet Take 325 mg by mouth daily.     ??? HumuLIN R Regular U-100 Insuln 100 unit/mL injection INJECT AS NEEDED PER SLIDING SCALE (Patient taking differently: INJECT AS NEEDED PER SLIDING SCALE Max dose 20 units daily) 10 mL 3   ??? lactulose (Generlac) 10 gram/15 mL solution Take 45 mL by mouth two (2) times a day for 90 days. 8100 mL 1   ??? Insulin Needles, Disposable, (BD ULTRA-FINE SHORT PEN NEEDLE) 31 gauge x 5/16" ndle Use 4 times daily to inject insulin. 1 Package 11   ??? Blood-Glucose Meter (ONETOUCH ULTRA2  METER) misc Use to check blood sugar BID 1 Each 0   ??? esomeprazole (NEXIUM) 20 mg capsule Take  by mouth daily.     ??? metoclopramide HCl (REGLAN) 10 mg tablet Take 10 mg by mouth Before breakfast, lunch, dinner and at bedtime.     ??? pramipexole (MIRAPEX) 0.125 mg tablet TAKE 1 TABLET BY MOUTH NIGHTLY 30 Tab 0   ??? oxyCODONE IR (ROXICODONE) 10 mg tab immediate release tablet Take 10 mg by mouth every six (6) hours as needed for Pain.     ??? Insulin Syringe-Needle U-100 1 mL 29 gauge x 1/2" syrg Three times per day and as needed as per sliding scale 100 Syringe 6   ??? naloxone (NARCAN) 4 mg/actuation nasal spray Use 1 spray intranasally into 1 nostril. Use a new Narcan nasal spray for subsequent doses and administer into alternating nostrils. May repeat every 2 to 3 minutes as needed for opioid overdose symptoms. 1 Each 0   ??? cholecalciferol, vitamin D3, (VITAMIN D3) 2,000 unit tab Take  by mouth.     ??? cyclobenzaprine (FLEXERIL) 10 mg tablet Take  by mouth three (3) times daily as needed for Muscle Spasm(s).         Review of Systems  Constitutional: The patient has no acute distress or discomfort.  HEENT: The patient denies recent head trauma, eye pain, blurred vision,  hearing deficit, oropharyngeal mucosal pain or lesions, and the patient denies throat pain or discomfort.  Lymphatics: The patient denies palpable peripheral lymphadenopathy.  Hematologic: The patient denies having bruising, bleeding, or progressive fatigue.  Respiratory: Patient denies having shortness of breath, cough, sputum production, fever, or dyspnea on exertion.  Cardiovascular: The patient denies having leg pain, leg swelling, heart palpitations, chest permit, chest pain, or lightheadedness.  The patient denies having dyspnea on exertion.  Gastrointestinal: The patient denies having nausea, emesis, or diarrhea. The patient denies having any hematemesis or blood in the stool.  Genitourinary: Patient denies having urinary urgency, frequency, or  dysuria.  The patient denies having blood in the urine.  Psychological: The patient denies having symptoms of nervousness, anxiety, depression, or thoughts of harming self.  Skin: Patient denies having skin rashes, skin, ulcerations, or unexplained itching or pruritus.  Musculoskeletal: The patient denies having pain in the joints or bones.      Objective:     Visit Vitals  BP 113/69 (BP Patient Position: Sitting)   Pulse 73   Temp 97.3 ??F (36.3 ??C) (Temporal)   Resp 16   Wt 57.6 kg (127 lb)   SpO2 97%   BMI 18.75 kg/m??     ECOG PS=0  Physical Exam:   Gen. Appearance: The patient is in no acute distress.  Skin: There is no bruise or rash.  HEENT: The exam is unremarkable.  Neck: Supple without lymphadenopathy  or thyromegaly.  Lungs: Clear to auscultation and percussion; there are no wheezes or rhonchi.  Heart: Regular rate and rhythm; there are no murmurs, gallops, or rubs.  Abdomen: Bowel sounds are present and normal.  There is no guarding, tenderness, or hepatosplenomegaly.  Extremities: There is no clubbing, cyanosis, or edema.  Neurologic: There are no focal neurologic deficits.  Lymphatics: There is no palpable peripheral lymphadenopathy. Musculoskeletal: The patient has full range of motion at all joints.  There is no evidence of joint deformity or effusions.  There is no focal joint tenderness.  Psychological/psychiatric: There is no clinical evidence of anxiety, depression, or melancholy.    Lab data:      Results for orders placed or performed during the hospital encounter of 01/31/19   CBC WITH 3 PART DIFF     Status: Abnormal   Result Value Ref Range Status    WBC 3.8 (L) 4.5 - 13.0 K/uL Final    RBC 3.69 (L) 4.10 - 5.10 M/uL Final    HGB 11.8 (L) 12.0 - 16 g/dL Final    HCT 35.8 (L) 36 - 48 % Final    MCV 97.0 78 - 102 FL Final    MCH 32.0 25.0 - 35.0 PG Final    MCHC 33.0 31 - 37 g/dL Final    RDW 15.1 (H) 11.5 - 14.5 % Final    PLATELET 78 (L) 140 - 440 K/uL Final    NEUTROPHILS 77 (H) 40 - 70 % Final     MIXED CELLS 10 0.1 - 17 % Final    LYMPHOCYTES 13 (L) 14 - 44 % Final    ABS. NEUTROPHILS 2.9 1.8 - 9.5 K/UL Final    ABS. MIXED CELLS 0.4 0.0 - 2.3 K/uL Final    ABS. LYMPHOCYTES 0.5 (L) 1.1 - 5.9 K/UL Final     Comment: Test performed at [place] or [facility] Location. Reviewed by Medical Director.    DF AUTOMATED   Final           Assessment:     1. Iron deficiency anemia secondary to inadequate dietary iron intake    2. Thrombocytopenia, unspecified ([place])    3. Refractory anemia (HCC)    4. Chronic leukopenia      Plan:   Iron deficiency anemia  Refractory anemia, concerned of myelodysplastic syndrome   -- Patient was being followed by Dr. Nadyne Coombes who retired. He has been offered Procrit 60,000 units every 2 weeks when the hemoglobin is below 10 g/dL and hematocrit is below 30%.  -- S.p Injectafer 750mg  IV x 2 doses for iron deficiency anemia   -- He noted occasional mild rectal bleeding from hemorrhoids which is being follow-up by his GI  -- Today I have reviewed with the patient and family about recent lab findings.  11/02/2019 CBC reported hemoglobin 12.7, hematocrit 29.1%, WBC 7.9, platelet 96, iron saturation 19% ferritin 443.    Plan:  -- Patient will follow up with his GI for occasional rectal bleeding.  Denied any acute bleeding at this time.  -- Will continue to monitor CBC, CMP, Iron profile , and ferritin prior to next office visit    Chronic leukopenia  -- 11/02/2019 CBC reported hemoglobin 12.7, hematocrit 29.1%, WBC 7.9, platelet 96, iron saturation 19% ferritin 443.    Thrombocytopenia  Liver Cirrhosis  -- 11/02/2019 CBC reported hemoglobin 12.7, hematocrit 29.1%, WBC 7.9, platelet 96, iron saturation 19% ferritin 443.  --  Clinical stable without any bleeding or bruising at this time  -- Will continue to monitor.      -- We will see the patient back in clinic in about 3 months. Always sooner if required.  The patient can have lab done prior  to our next clinic visit.      Orders Placed This Encounter   ??? riboflavin, vitamin B2, 100 mg tablet     Sig: TAKE 4 TABLETS BY MOUTH EVERY DAY WITH FOOD           William Blanchard has a reminder for a "due or due soon" health maintenance. I have asked that he contact his primary care provider for follow-up on this health maintenance.   All of patient's questions answered to their apparent satisfaction. They verbally show understanding and agreement with aforementioned plan.         Ena Dawley Dorotha Hirschi, MD  11/16/2019          About 30 minutes were spent for this encounter with more than 50% of the time spent in face-to-face counseling, discussing on diagnosis and management plan going forward, and co-ordination of care.  Parts of this document has been produced using Dragon dictation system. Unrecognized errors in transcription may be present. Please Desani Sprung not hesitate to reach out for any questions or clarifications.      CC: [name], MD

## 2019-11-20 ENCOUNTER — Telehealth: Admit: 2019-11-20 | Discharge: 2019-11-20 | Payer: MEDICARE | Attending: Legal Medicine | Primary: Legal Medicine

## 2019-11-20 DIAGNOSIS — E119 Type 2 diabetes mellitus without complications: Secondary | ICD-10-CM

## 2019-11-20 MED ORDER — LACTULOSE 10 GRAM/15 ML ORAL SOLN
10 gram/15 mL | Freq: Two times a day (BID) | ORAL | 3 refills | Status: AC
Start: 2019-11-20 — End: 2020-05-09

## 2019-11-20 MED ORDER — INSULIN GLARGINE 100 UNIT/ML (3 ML) SUB-Q PEN
100 unit/mL (3 mL) | Freq: Every day | SUBCUTANEOUS | 3 refills | Status: DC
Start: 2019-11-20 — End: 2020-03-05

## 2019-11-20 NOTE — Progress Notes (Signed)
William Blalock Biello Sr. is a 74 y.o. male who was seen by synchronous (real-time) audio-video technology on 11/20/2019 for Blood sugar problem      Blood sugar has been elevated and he is taking insulin daily 3 times a day average 20 units/day  Today   Fasting   150 this morning    263 after breakfast      It gets over 300 and always in the upper 200s during lunch time    And patient is conscious with his eating and his eating has not changed        His inguinal hernia is getting worse and painful some days he can not get out of bed, it is getting bigger he had seen Dr. Vira Blanco May 15 2019 as per patient he was reluctant to do surgery I recommended that he should follow-up with surgery since the hernia is getting bigger and more symptomatic  He has been having rectal bleed and he thought it may be related to the hernia, I do not think it is related to his hernia, patient follow-up with Dr. Hassell Done gastroenterology      Assessment & Plan:   Diagnoses and all orders for this visit:    1. Controlled type 2 diabetes mellitus without complication, without long-term current use of insulin ([place])    To be started on insulin 5 units daily and repeated in the morning, he can increase 2 units every 3 days if fasting blood sugar above 150, he reaches 10 units  Follow-up in 2 weeks  -     insulin glargine (LANTUS,BASAGLAR) 100 unit/mL (3 mL) inpn; 10 Units by SubCUTAneous route daily for 30 days. Take daily  Start 5 units daily and to increase 2 units every 3 days to reach 10 units as discussed    2. GAD (generalized anxiety disorder)  Stable  Patient has a lot of anxiety due to his chronic diseases and illnesses  3. Esophageal varices without bleeding, unspecified esophageal varices type (HCC)  Stable no recent bleeding  4. Other cirrhosis of liver (HCC)  -     lactulose (Generlac) 10 gram/15 mL solution; Take 45 mL by mouth two (2) times a day for 90 days.    5. 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  NAFLD (nonalcoholic fatty liver disease)    7. Moderate major depression (HCC)  Stable on current medications        712  Subjective:       Prior to Admission medications    Medication Sig Start Date End Date Taking? Authorizing Provider   insulin glargine (LANTUS,BASAGLAR) 100 unit/mL (3 mL) inpn 10 Units by SubCUTAneous route daily for 30 days. Take daily  Start 5 units daily and to increase 2 units every 3 days to reach 10 units as discussed 11/20/19 12/20/19 Yes Lucerito Rosinski, Kathi Ludwig, MD   lactulose (Generlac) 10 gram/15 mL solution Take 45 mL by mouth two (2) times a day for 90 days. 11/20/19 02/18/20 Yes Magic Mohler S, MD   riboflavin, vitamin B2, 100 mg tablet TAKE 4 TABLETS BY MOUTH EVERY DAY WITH FOOD 08/28/19  Yes Provider, Historical   lancets (OneTouch UltraSoft Lancets) misc USE TO TEST BLOOD GLUCOSE FOUR TIMES DAILY 11/07/19  Yes Oscar Hank, Kathi Ludwig, MD   LORazepam (ATIVAN) 0.5 mg tablet Take 2 Tabs by mouth two (2) times daily as needed for Anxiety for up to 90 days. Max Daily Amount: 2 mg. 11/07/19 02/05/20  Yes [name], MD   Insulin Syringe-Needle U-100 0.3 mL 31 gauge x 5/16" syrg USE 4 TIMES DAILY TO INJECT INSULIN 10/07/19  Yes Kandiss Ihrig S, MD   metFORMIN (GLUCOPHAGE) 500 mg tablet TAKE 1 TABLET BY MOUTH TWICE DAILY WITH MEALS 10/01/19  Yes Delane Wessinger S, MD   finasteride (PROSCAR) 5 mg tablet TAKE 1 TABLET BY MOUTH DAILY 10/01/19  Yes Nidia Grogan S, MD   sucralfate (CARAFATE) 1 gram tablet TAKE 1 TABLET BY MOUTH FOUR TIMES DAILY AT BEDTIME WITH MEALS 10/01/19  Yes Bellamy Rubey S, MD   furosemide (LASIX) 20 mg tablet Take 20 mg by mouth daily.   Yes Provider, Historical   spironolactone (ALDACTONE) 50 mg tablet Take 50 mg by mouth two (2) times a day.   Yes Provider, Historical   folic acid/multivit-min/lutein (CENTRUM SILVER PO) Take  by mouth.   Yes Provider, Historical   MAGNESIUM PO Take 1 Tab by mouth daily.   Yes Provider, Historical   rosuvastatin (CRESTOR) 10 mg tablet TAKE 1  TABLET BY MOUTH NIGHTLY 07/17/19  Yes Leaman Abe S, MD   topiramate (TOPAMAX) 100 mg tablet TAKE 1 TABLET BY MOUTH DAILY 07/02/19  Yes Tylah Mancillas S, MD   glucose blood VI test strips (ASCENSIA AUTODISC VI, ONE TOUCH ULTRA TEST VI) strip Use to test blood sugars four times daily with Onetouch ultra 2. 04/25/19  Yes Daleen Steinhaus S, MD   propranoloL (INDERAL) 20 mg tablet TAKE 1 TABLET BY MOUTH TWICE DAILY 04/17/19  Yes [name], MD   prochlorperazine (COMPAZINE) 10 mg tablet Take 1 Tab by mouth every six (6) hours as needed for Nausea or Vomiting. 03/15/19  Yes Nitzia Perren S, MD   venlafaxine-SR (Effexor XR) 150 mg capsule Take 150 mg by mouth daily.   Yes Provider, Historical   aspirin (ASPIRIN) 325 mg tablet Take 325 mg by mouth daily. 10/25/18  Yes Provider, Historical   HumuLIN R Regular U-100 Insuln 100 unit/mL injection INJECT AS NEEDED PER SLIDING SCALE  Patient taking differently: INJECT AS NEEDED PER SLIDING SCALE Max dose 20 units daily 10/17/18  Yes Aara Jacquot S, MD   Insulin Needles, Disposable, (BD ULTRA-FINE SHORT PEN NEEDLE) 31 gauge x 5/16" ndle Use 4 times daily to inject insulin. 08/22/18  Yes [name], MD   Blood-Glucose Meter (ONETOUCH ULTRA2 METER) misc Use to check blood sugar BID 08/22/18  Yes Sarahelizabeth Conway S, MD   esomeprazole (NEXIUM) 20 mg capsule Take  by mouth daily.   Yes Provider, Historical   metoclopramide HCl (REGLAN) 10 mg tablet Take 10 mg by mouth Before breakfast, lunch, dinner and at bedtime.   Yes Provider, Historical   pramipexole (MIRAPEX) 0.125 mg tablet TAKE 1 TABLET BY MOUTH NIGHTLY 12/30/17  Yes Atom Solivan S, MD   oxyCODONE IR (ROXICODONE) 10 mg tab immediate release tablet Take 10 mg by mouth every six (6) hours as needed for Pain.   Yes Provider, Historical   Insulin Syringe-Needle U-100 1 mL 29 gauge x 1/2" syrg Three times per day and as needed as per sliding scale 03/31/17  Yes Gredmarie Delange S, MD   naloxone (NARCAN) 4 mg/actuation  nasal spray Use 1 spray intranasally into 1 nostril. Use a new Narcan nasal spray for subsequent doses and administer into alternating nostrils. May repeat every 2 to 3 minutes as needed for opioid overdose symptoms. 01/01/17  Yes [name], MD   cholecalciferol, vitamin D3, (VITAMIN D3)  2,000 unit tab Take  by mouth.   Yes Provider, Historical   cyclobenzaprine (FLEXERIL) 10 mg tablet Take  by mouth three (3) times daily as needed for Muscle Spasm(s).   Yes Provider, Historical   lactulose (CHRONULAC) 10 gram/15 mL solution Take  by mouth daily.  Patient not taking: Reported on 11/20/2019  11/20/19  Provider, Historical   cyanocobalamin (Vitamin B-12) 100 mcg tablet Take  by mouth daily.  Patient not taking: Reported on 11/20/2019    Provider, Historical   propranoloL (INDERAL) 20 mg tablet Take 1 Tab by mouth two (2) times a day for 90 days. 07/17/19 10/15/19  [name], MD   lactulose (Generlac) 10 gram/15 mL solution Take 45 mL by mouth two (2) times a day for 90 days. 09/20/18 11/20/19  [name], MD     Patient Active Problem List   Diagnosis Code   ??? Chronic anemia D64.9   ??? Controlled type 2 diabetes mellitus without complication, without long-term current use of insulin ([place]) E11.9   ??? Essential hypertension I10   ??? Hyperlipidemia E78.5   ??? History of stroke Z86.73   ??? Anxiety F41.9   ??? Chronic pain G89.29   ??? Iron deficiency anemia D50.9   ??? 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 Other cirrhosis of liver (HCC) K74.69   ??? Depression F32.9   ??? Type II diabetes mellitus (HCC) E11.9   ??? HTN (hypertension) I10   ??? 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 Stroke (HCC) I63.9   ??? Compressed vertebrae [id number]   ??? Chronic back pain greater than 3 months duration M54.9, G89.29   ??? Hyperlipemia E78.5   ??? Anxiety F41.9   ??? 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 Iron deficiency anemia secondary to inadequate dietary iron intake D50.8   ??? 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 Thrombocytopenia ([place]) D69.6   ???  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 Benign prostatic hyperplasia (BPH) with straining on urination N40.1, R39.16   ??? 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 Dermatitis L30.9   ??? Migraine without aura and without status migrainosus, not intractable G43.009   ??? Restless leg syndrome G25.81   ??? Peripheral vascular disease (HCC) I73.9   ??? Varices of esophagus determined by endoscopy ([place]) I85.00     Patient Active Problem List    Diagnosis Date Noted   ??? Varices of esophagus determined by endoscopy ([place]) 03/15/2019   ??? Peripheral vascular disease ([place]) 02/21/2018   ??? Restless leg syndrome 12/04/2017   ??? Migraine without aura and without status migrainosus, not intractable 04/15/2017   ??? 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 Dermatitis 01/05/2017   ??? Benign prostatic hyperplasia (BPH) with straining on urination 12/31/2016   ??? 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 Myelodysplastic syndrome ([place]) 12/22/2016   ??? Type 2 diabetes with nephropathy ([place]) 10/18/2016   ??? Iron deficiency anemia secondary to inadequate dietary iron intake 09/23/2016   ??? Chronic leukopenia 09/23/2016   ??? Thrombocytopenia ([place]) 09/23/2016   ??? Moderate major depression ([place]) 09/10/2016   ??? Type II diabetes mellitus ([place]) 08/03/2016   ??? HTN (hypertension) 08/03/2016   ??? 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 Stroke ([place]) 08/03/2016   ??? Compressed vertebrae 08/03/2016   ??? Chronic back pain greater than 3 months duration 08/03/2016   ???  Hyperlipemia 08/03/2016   ??? Anxiety 08/03/2016   ??? Depression 06/30/2016   ??? Iron deficiency anemia 04/22/2016   ??? NAFLD (nonalcoholic fatty liver disease) 04/22/2016   ??? Other cirrhosis of liver ([place]) 04/22/2016   ??? Chronic anemia 03/18/2016   ??? Controlled type 2 diabetes mellitus without complication, without long-term current use of insulin ([place]) 03/18/2016   ??? Essential hypertension  03/18/2016   ??? Hyperlipidemia 03/18/2016   ??? History of stroke 03/18/2016   ??? Anxiety 03/18/2016   ??? Chronic pain 03/18/2016     Current Outpatient Medications   Medication Sig Dispense Refill   ??? insulin glargine (LANTUS,BASAGLAR) 100 unit/mL (3 mL) inpn 10 Units by SubCUTAneous route daily for 30 days. Take daily  Start 5 units daily and to increase 2 units every 3 days to reach 10 units as discussed 3 mL 3   ??? lactulose (Generlac) 10 gram/15 mL solution Take 45 mL by mouth two (2) times a day for 90 days. 8100 mL 3   ??? riboflavin, vitamin B2, 100 mg tablet TAKE 4 TABLETS BY MOUTH EVERY DAY WITH FOOD     ??? lancets (OneTouch UltraSoft Lancets) misc USE TO TEST BLOOD GLUCOSE FOUR TIMES DAILY 100 Each 5   ??? LORazepam (ATIVAN) 0.5 mg tablet Take 2 Tabs by mouth two (2) times daily as needed for Anxiety for up to 90 days. Max Daily Amount: 2 mg. 180 Tab 0   ??? Insulin Syringe-Needle U-100 0.3 mL 31 gauge x 5/16" syrg USE 4 TIMES DAILY TO INJECT INSULIN 200 Syringe 5   ??? metFORMIN (GLUCOPHAGE) 500 mg tablet TAKE 1 TABLET BY MOUTH TWICE DAILY WITH MEALS 180 Tab 0   ??? finasteride (PROSCAR) 5 mg tablet TAKE 1 TABLET BY MOUTH DAILY 90 Tab 0   ??? sucralfate (CARAFATE) 1 gram tablet TAKE 1 TABLET BY MOUTH FOUR TIMES DAILY AT BEDTIME WITH MEALS 360 Tab 0   ??? furosemide (LASIX) 20 mg tablet Take 20 mg by mouth daily.     ??? spironolactone (ALDACTONE) 50 mg tablet Take 50 mg by mouth two (2) times a day.     ??? folic acid/multivit-min/lutein (CENTRUM SILVER PO) Take  by mouth.     ??? MAGNESIUM PO Take 1 Tab by mouth daily.     ??? rosuvastatin (CRESTOR) 10 mg tablet TAKE 1 TABLET BY MOUTH NIGHTLY 90 Tab 1   ??? topiramate (TOPAMAX) 100 mg tablet TAKE 1 TABLET BY MOUTH DAILY 90 Tab 1   ??? glucose blood VI test strips (ASCENSIA AUTODISC VI, ONE TOUCH ULTRA TEST VI) strip Use to test blood sugars four times daily with Onetouch ultra 2. 400 Strip 3   ??? propranoloL (INDERAL) 20 mg tablet TAKE 1 TABLET BY MOUTH TWICE DAILY 180 Tab 0   ???  prochlorperazine (COMPAZINE) 10 mg tablet Take 1 Tab by mouth every six (6) hours as needed for Nausea or Vomiting. 30 Tab 1   ??? venlafaxine-SR (Effexor XR) 150 mg capsule Take 150 mg by mouth daily.     ??? aspirin (ASPIRIN) 325 mg tablet Take 325 mg by mouth daily.     ??? HumuLIN R Regular U-100 Insuln 100 unit/mL injection INJECT AS NEEDED PER SLIDING SCALE (Patient taking differently: INJECT AS NEEDED PER SLIDING SCALE Max dose 20 units daily) 10 mL 3   ??? Insulin Needles, Disposable, (BD ULTRA-FINE SHORT PEN NEEDLE) 31 gauge x 5/16" ndle Use 4 times daily to inject insulin. 1 Package 11   ??? Blood-Glucose  Meter (ONETOUCH ULTRA2 METER) misc Use to check blood sugar BID 1 Each 0   ??? esomeprazole (NEXIUM) 20 mg capsule Take  by mouth daily.     ??? metoclopramide HCl (REGLAN) 10 mg tablet Take 10 mg by mouth Before breakfast, lunch, dinner and at bedtime.     ??? pramipexole (MIRAPEX) 0.125 mg tablet TAKE 1 TABLET BY MOUTH NIGHTLY 30 Tab 0   ??? oxyCODONE IR (ROXICODONE) 10 mg tab immediate release tablet Take 10 mg by mouth every six (6) hours as needed for Pain.     ??? Insulin Syringe-Needle U-100 1 mL 29 gauge x 1/2" syrg Three times per day and as needed as per sliding scale 100 Syringe 6   ??? naloxone (NARCAN) 4 mg/actuation nasal spray Use 1 spray intranasally into 1 nostril. Use a new Narcan nasal spray for subsequent doses and administer into alternating nostrils. May repeat every 2 to 3 minutes as needed for opioid overdose symptoms. 1 Each 0   ??? cholecalciferol, vitamin D3, (VITAMIN D3) 2,000 unit tab Take  by mouth.     ??? cyclobenzaprine (FLEXERIL) 10 mg tablet Take  by mouth three (3) times daily as needed for Muscle Spasm(s).     ??? cyanocobalamin (Vitamin B-12) 100 mcg tablet Take  by mouth daily. (Patient not taking: Reported on 11/20/2019)     ??? propranoloL (INDERAL) 20 mg tablet Take 1 Tab by mouth two (2) times a day for 90 days. 180 Tab 1     Allergies   Allergen Reactions   ??? Doxycycline Hives   ??? Doxycycline  Rash   ??? Gabapentin Other (comments)     Per pt became violent    ??? Gabapentin Other (comments)     Slurred speech   ??? Lyrica [Pregabalin] Other (comments)     Per pt stroke symptoms   ??? Lyrica [Pregabalin] Anxiety   ??? Penicillin G Rash   ??? Penicillins Hives     Past Medical History:   Diagnosis Date   ??? Anemia    ??? Anxiety    ??? Chronic pain    ??? Cirrhosis of liver ([place])    ??? Diabetes ([place])    ??? GERD (gastroesophageal reflux disease)    ??? Hyperlipemia    ??? Hypertension    ??? Hypotension    ??? Migraine    ??? Other cirrhosis of liver ([place])    ??? Stroke [facility])     had 3 strokes    ??? Thrombocytopenia ([place])      Past Surgical History:   Procedure Laterality Date   ??? HX CHOLECYSTECTOMY     ??? HX ORTHOPAEDIC      R hand sx   ??? HX OTHER SURGICAL      Hand surgery- Nail gun went through finger     Family History   Problem Relation Age of Onset   ??? Alzheimer Mother    ??? Diabetes Mother    ??? Cancer Mother         colon cancer    ??? Hypertension Father    ??? Heart Disease Father    ??? Cancer Father         prostate, lung cancer    ??? Diabetes Sister    ??? Heart Disease Sister    ??? Diabetes Brother      Social History     Tobacco Use   ??? Smoking status: Former Smoker   ??? Smokeless tobacco: Never Used   ???  Tobacco comment: quit years ago   Substance Use Topics   ??? Alcohol use: No       Review of Systems   Respiratory: Negative for wheezing.    Musculoskeletal: Positive for back pain and joint pain. Negative for falls, myalgias and neck pain.       Objective:     Patient-Reported Vitals 07/18/2019   Patient-Reported Weight 135lb   Patient-Reported Pulse -   Patient-Reported Temperature 97.9   Patient-Reported Systolic  0000000   Patient-Reported Diastolic 67      General: alert, cooperative, no distress   Mental  status: normal mood, behavior, speech, dress, motor activity, and thought processes, able to follow commands   HENT: NCAT   Neck: no visualized mass   Resp: no respiratory distress   Neuro: no gross deficits   Skin: no discoloration or  lesions of concern on visible areas   Psychiatric: normal affect, consistent with stated mood, no evidence of hallucinations     Additional exam findings:       We discussed the expected course, resolution and complications of the diagnosis(es) in detail.  Medication risks, benefits, costs, interactions, and alternatives were discussed as indicated.  I advised him to contact the office if his condition worsens, changes or fails to improve as anticipated. He expressed understanding with the diagnosis(es) and plan.     William Blalock Corprew Sr., was evaluated through a synchronous (real-time) audio-video encounter. The patient (or guardian if applicable) is aware that this is a billable service. Verbal consent to proceed has been obtained within the past 12 months. The visit was conducted pursuant to the emergency declaration under the [place], [place] waiver authority and the [employer] Act.  Patient identification was verified, and a caregiver was present when appropriate. The patient was located in a state where the provider was credentialed to provide care.    [name], MD

## 2019-11-20 NOTE — Progress Notes (Signed)
Verl Blalock Hugill Sr. is a 74 y.o. male (DOB: 12-Dec-1945) presenting to address:    Chief Complaint   Patient presents with   ??? Blood sugar problem       There were no vitals filed for this visit.    Is someone accompanying this pt?  yes    Is the patient using any DME equipment during OV?  NO    Hearing/Vision:   No exam data present    Learning Assessment:     Learning Assessment 02/21/2018   PRIMARY LEARNER Patient   HIGHEST LEVEL OF EDUCATION - PRIMARY LEARNER  -   BARRIERS PRIMARY LEARNER -   CO-LEARNER CAREGIVER -   PRIMARY LANGUAGE ENGLISH   LEARNER PREFERENCE PRIMARY READING     LISTENING     VIDEOS   ANSWERED BY patient   RELATIONSHIP SELF     Depression Screening:     3 most recent PHQ Screens 11/20/2019   Little interest or pleasure in doing things Not at all   Feeling down, depressed, irritable, or hopeless Not at all   Total Score PHQ 2 0     Fall Risk Assessment:     Fall Risk Assessment, last 12 mths 11/20/2019   Able to walk? Yes   Fall in past 12 months? 0   Do you feel unsteady? 0   Are you worried about falling 0   Number of falls in past 12 months -   Fall with injury? -     Coordination of Care Questionaire:   1. Have you been to the ER, urgent care clinic since your last visit?  Hospitalized since your last visit? NO    2. Have you seen or consulted any other health care providers outside of the [facility] since your last visit?  Include any pap smears or colon screening. NO    Advanced Directive:   1. Do you have an Advanced Directive? NO    2. Would you like information on Advanced Directives? NO

## 2019-11-23 ENCOUNTER — Telehealth

## 2019-11-23 MED ORDER — INSULIN NEEDLES (DISPOSABLE) 31 X 5/16"
31 gauge x 5/16" | PACK | 11 refills | Status: AC
Start: 2019-11-23 — End: ?

## 2019-11-23 NOTE — Telephone Encounter (Signed)
Done

## 2019-11-23 NOTE — Telephone Encounter (Signed)
Please assist, thank you.

## 2019-11-23 NOTE — Telephone Encounter (Signed)
Pharmacist from [facility] called stating that the insuline was sent over without pen needles. Pharmacist would like to know if dr. Phineas Blanchard is able to send some over today. Please advise

## 2019-11-30 NOTE — Telephone Encounter (Signed)
Lvm to inform pt.

## 2019-12-19 ENCOUNTER — Encounter

## 2019-12-20 MED ORDER — PROCHLORPERAZINE MALEATE 10 MG TAB
10 mg | ORAL_TABLET | ORAL | 1 refills | Status: DC
Start: 2019-12-20 — End: 2020-03-01

## 2020-01-01 ENCOUNTER — Encounter

## 2020-01-02 MED ORDER — FINASTERIDE 5 MG TAB
5 mg | ORAL_TABLET | ORAL | 0 refills | Status: DC
Start: 2020-01-02 — End: 2020-04-07

## 2020-01-07 ENCOUNTER — Encounter

## 2020-01-07 MED ORDER — SUCRALFATE 1 GRAM TAB
1 gram | ORAL_TABLET | ORAL | 0 refills | Status: DC
Start: 2020-01-07 — End: 2020-03-18

## 2020-01-07 MED ORDER — METFORMIN 500 MG TAB
500 mg | ORAL_TABLET | ORAL | 0 refills | Status: DC
Start: 2020-01-07 — End: 2020-03-31

## 2020-01-07 MED ORDER — SUCRALFATE 1 GRAM TAB
1 gram | ORAL_TABLET | ORAL | 0 refills | Status: DC
Start: 2020-01-07 — End: 2020-01-07

## 2020-01-07 MED ORDER — METFORMIN 500 MG TAB
500 mg | ORAL_TABLET | ORAL | 0 refills | Status: DC
Start: 2020-01-07 — End: 2020-01-07

## 2020-01-14 ENCOUNTER — Encounter

## 2020-01-18 MED ORDER — TOPIRAMATE 100 MG TAB
100 mg | ORAL_TABLET | ORAL | 1 refills | Status: DC
Start: 2020-01-18 — End: 2020-01-28

## 2020-01-21 LAB — HEMOGLOBIN A1C WITH EAG: Hemoglobin A1c (POC): 6.4 %

## 2020-01-22 MED ORDER — SUCRALFATE 1 GRAM TAB
1 | ORAL | Status: DC
Start: 2020-01-22 — End: 2020-01-22

## 2020-01-22 MED ORDER — TOPIRAMATE 25 MG TAB
25 | ORAL | Status: DC
Start: 2020-01-22 — End: 2020-01-22

## 2020-01-22 MED ORDER — PRAMIPEXOLE 0.25 MG TAB
0.25 | ORAL | Status: DC
Start: 2020-01-22 — End: 2020-01-22

## 2020-01-22 MED ORDER — LORAZEPAM 0.5 MG TAB
0.5 | ORAL | Status: DC | PRN
Start: 2020-01-22 — End: 2020-01-22

## 2020-01-22 MED ORDER — INSULIN LISPRO 100 UNIT/ML INJECTION
100 | SUBCUTANEOUS | Status: DC
Start: 2020-01-22 — End: 2020-01-22

## 2020-01-22 MED ORDER — GENERIC EXTERNAL MEDICATION
Status: DC
Start: 2020-01-22 — End: 2020-01-22

## 2020-01-22 MED ORDER — NITROGLYCERIN 0.4 MG SUBLINGUAL TAB
0.4 | SUBLINGUAL | Status: DC
Start: 2020-01-22 — End: 2020-01-22

## 2020-01-22 MED ORDER — DICYCLOMINE 10 MG CAP
10 | ORAL | Status: DC | PRN
Start: 2020-01-22 — End: 2020-01-22

## 2020-01-22 MED ORDER — OMEPRAZOLE 20 MG CAP, DELAYED RELEASE
20 | ORAL | Status: DC
Start: 2020-01-22 — End: 2020-01-22

## 2020-01-22 MED ORDER — ROSUVASTATIN 10 MG TAB
10 | ORAL | Status: DC
Start: 2020-01-22 — End: 2020-01-22

## 2020-01-22 MED ORDER — FINASTERIDE 5 MG TAB
5 | ORAL | Status: DC
Start: 2020-01-22 — End: 2020-01-22

## 2020-01-22 MED ORDER — METOCLOPRAMIDE 10 MG TAB
10 | ORAL | Status: DC
Start: 2020-01-22 — End: 2020-01-22

## 2020-01-22 MED ORDER — PROPRANOLOL 20 MG TAB
20 | ORAL | Status: DC
Start: 2020-01-22 — End: 2020-01-22

## 2020-01-22 MED ORDER — ONDANSETRON (PF) 4 MG/2 ML INJECTION
4 | INTRAMUSCULAR | Status: DC | PRN
Start: 2020-01-22 — End: 2020-01-22

## 2020-01-22 MED ORDER — HEPARIN (PORCINE) 5,000 UNIT/ML IJ SOLN
5000 | INTRAMUSCULAR | Status: DC
Start: 2020-01-22 — End: 2020-01-22

## 2020-01-22 MED ORDER — ATROPINE 0.1 MG/ML SYRINGE
0.1 | INTRAMUSCULAR | Status: DC | PRN
Start: 2020-01-22 — End: 2020-01-22

## 2020-01-22 MED ORDER — VENLAFAXINE SR 150 MG 24 HR CAP
150 | ORAL | Status: DC
Start: 2020-01-22 — End: 2020-01-22

## 2020-01-28 ENCOUNTER — Ambulatory Visit: Admit: 2020-01-28 | Discharge: 2020-01-28 | Payer: MEDICARE | Attending: Legal Medicine | Primary: Legal Medicine

## 2020-01-28 DIAGNOSIS — Z09 Encounter for follow-up examination after completed treatment for conditions other than malignant neoplasm: Secondary | ICD-10-CM

## 2020-01-28 MED ORDER — TOPIRAMATE 100 MG TAB
100 mg | ORAL_TABLET | ORAL | 3 refills | Status: AC
Start: 2020-01-28 — End: ?

## 2020-01-28 MED ORDER — PROPRANOLOL 20 MG TAB
20 mg | ORAL_TABLET | Freq: Two times a day (BID) | ORAL | 3 refills | Status: AC
Start: 2020-01-28 — End: 2020-05-09

## 2020-02-05 ENCOUNTER — Inpatient Hospital Stay: Admit: 2020-02-05 | Payer: MEDICARE | Primary: Legal Medicine

## 2020-02-05 ENCOUNTER — Other Ambulatory Visit: Admit: 2020-02-05 | Discharge: 2020-02-05 | Payer: MEDICARE | Primary: Legal Medicine

## 2020-02-05 DIAGNOSIS — D508 Other iron deficiency anemias: Secondary | ICD-10-CM

## 2020-02-05 LAB — CBC WITH AUTOMATED DIFF
ABS. BASOPHILS: 0 10*3/uL (ref 0.0–0.1)
ABS. EOSINOPHILS: 0.2 10*3/uL (ref 0.0–0.4)
ABS. LYMPHOCYTES: 0.4 10*3/uL — ABNORMAL LOW (ref 0.9–3.6)
ABS. MONOCYTES: 0.4 10*3/uL (ref 0.05–1.2)
ABS. NEUTROPHILS: 5.5 10*3/uL (ref 1.8–8.0)
BASOPHILS: 1 % (ref 0–2)
EOSINOPHILS: 3 % (ref 0–5)
HCT: 39.3 % (ref 36.0–48.0)
HGB: 12.3 g/dL — ABNORMAL LOW (ref 13.0–16.0)
LYMPHOCYTES: 6 % — ABNORMAL LOW (ref 21–52)
MCH: 30.2 PG (ref 24.0–34.0)
MCHC: 31.3 g/dL (ref 31.0–37.0)
MCV: 96.6 FL (ref 74.0–97.0)
MONOCYTES: 6 % (ref 3–10)
MPV: 9.8 FL (ref 9.2–11.8)
NEUTROPHILS: 84 % — ABNORMAL HIGH (ref 40–73)
PLATELET: 170 10*3/uL (ref 135–420)
RBC: 4.07 M/uL — ABNORMAL LOW (ref 4.35–5.65)
RDW: 14.5 % (ref 11.6–14.5)
WBC: 6.6 10*3/uL (ref 4.6–13.2)

## 2020-02-05 LAB — METABOLIC PANEL, COMPREHENSIVE
A-G Ratio: 1.1 (ref 0.8–1.7)
ALT (SGPT): 34 U/L (ref 16–61)
AST (SGOT): 47 U/L — ABNORMAL HIGH (ref 10–38)
Albumin: 3.5 g/dL (ref 3.4–5.0)
Alk. phosphatase: 420 U/L — ABNORMAL HIGH (ref 45–117)
Anion gap: 8 mmol/L (ref 3.0–18)
BUN/Creatinine ratio: 18 (ref 12–20)
BUN: 22 MG/DL — ABNORMAL HIGH (ref 7.0–18)
Bilirubin, total: 0.5 MG/DL (ref 0.2–1.0)
CO2: 23 mmol/L (ref 21–32)
Calcium: 9 MG/DL (ref 8.5–10.1)
Chloride: 104 mmol/L (ref 100–111)
Creatinine: 1.25 MG/DL (ref 0.6–1.3)
GFR est AA: >60 mL/min/{1.73_m2} (ref >60)
GFR est non-AA: 57 mL/min/{1.73_m2} — ABNORMAL LOW (ref >60)
Globulin: 3.3 g/dL (ref 2.0–4.0)
Glucose: 142 mg/dL — ABNORMAL HIGH (ref 74–99)
Potassium: 4.4 mmol/L (ref 3.5–5.5)
Protein, total: 6.8 g/dL (ref 6.4–8.2)
Sodium: 135 mmol/L — ABNORMAL LOW (ref 136–145)

## 2020-02-05 LAB — IRON PROFILE
Iron % saturation: 15 % — ABNORMAL LOW (ref 20–50)
Iron: 38 ug/dL — ABNORMAL LOW (ref 50–175)
TIBC: 255 ug/dL (ref 250–450)

## 2020-02-05 LAB — FERRITIN: Ferritin: 145 NG/ML (ref 8–388)

## 2020-02-18 ENCOUNTER — Encounter: Attending: Internal Medicine | Primary: Legal Medicine

## 2020-02-25 ENCOUNTER — Ambulatory Visit: Admit: 2020-02-25 | Discharge: 2020-02-25 | Payer: MEDICARE | Attending: Legal Medicine | Primary: Legal Medicine

## 2020-02-25 DIAGNOSIS — N63 Unspecified lump in unspecified breast: Secondary | ICD-10-CM

## 2020-02-25 MED ORDER — CIPROFLOXACIN-HYDROCORTISONE 0.2 %-1 % EAR DROPS, SUSP
Freq: Two times a day (BID) | OTIC | 0 refills | Status: AC
Start: 2020-02-25 — End: 2020-03-03

## 2020-02-27 ENCOUNTER — Telehealth

## 2020-02-28 ENCOUNTER — Encounter

## 2020-03-01 MED ORDER — PROCHLORPERAZINE MALEATE 10 MG TAB
10 mg | ORAL_TABLET | ORAL | 1 refills | Status: AC
Start: 2020-03-01 — End: ?

## 2020-03-01 MED ORDER — ROSUVASTATIN 10 MG TAB
10 mg | ORAL_TABLET | ORAL | 1 refills | Status: AC
Start: 2020-03-01 — End: ?

## 2020-03-05 ENCOUNTER — Telehealth: Admit: 2020-03-05 | Discharge: 2020-03-05 | Payer: MEDICARE | Attending: Internal Medicine | Primary: Legal Medicine

## 2020-03-05 ENCOUNTER — Encounter

## 2020-03-05 DIAGNOSIS — D508 Other iron deficiency anemias: Secondary | ICD-10-CM

## 2020-03-05 MED ORDER — LANTUS SOLOSTAR U-100 INSULIN 100 UNIT/ML (3 ML) SUBCUTANEOUS PEN
100 unit/mL (3 mL) | SUBCUTANEOUS | 3 refills | Status: AC
Start: 2020-03-05 — End: ?

## 2020-03-07 ENCOUNTER — Encounter

## 2020-03-08 MED ORDER — LANTUS SOLOSTAR U-100 INSULIN 100 UNIT/ML (3 ML) SUBCUTANEOUS PEN
100 unit/mL (3 mL) | PEN_INJECTOR | Freq: Every day | SUBCUTANEOUS | 5 refills | Status: AC
Start: 2020-03-08 — End: 2020-06-06

## 2020-03-19 ENCOUNTER — Encounter

## 2020-03-19 MED ORDER — SUCRALFATE 1 GRAM TAB
1 gram | ORAL_TABLET | ORAL | 5 refills | Status: AC
Start: 2020-03-19 — End: ?

## 2020-03-19 MED ORDER — SUCRALFATE 1 GRAM TAB
1 gram | ORAL_TABLET | ORAL | 1 refills | Status: DC
Start: 2020-03-19 — End: 2020-03-19

## 2020-03-27 ENCOUNTER — Encounter: Admit: 2020-03-27 | Discharge: 2020-03-27 | Payer: MEDICARE | Primary: Legal Medicine

## 2020-03-27 ENCOUNTER — Ambulatory Visit: Admit: 2020-03-27 | Discharge: 2020-03-27 | Payer: MEDICARE | Attending: Legal Medicine | Primary: Legal Medicine

## 2020-03-27 DIAGNOSIS — M952 Other acquired deformity of head: Secondary | ICD-10-CM

## 2020-03-27 DIAGNOSIS — E118 Type 2 diabetes mellitus with unspecified complications: Secondary | ICD-10-CM

## 2020-03-27 MED ORDER — LORAZEPAM 0.5 MG TAB
0.5 mg | ORAL_TABLET | Freq: Two times a day (BID) | ORAL | 0 refills | Status: AC | PRN
Start: 2020-03-27 — End: 2020-06-25

## 2020-03-31 ENCOUNTER — Encounter

## 2020-04-01 ENCOUNTER — Telehealth

## 2020-04-01 MED ORDER — BUTALBITAL-ACETAMINOPHEN-CAFFEINE 50 MG-325 MG-40 MG TAB
50-325-40 mg | ORAL_TABLET | Freq: Four times a day (QID) | ORAL | 0 refills | Status: AC | PRN
Start: 2020-04-01 — End: 2020-04-04

## 2020-04-01 MED ORDER — METFORMIN 500 MG TAB
500 mg | ORAL_TABLET | Freq: Two times a day (BID) | ORAL | 0 refills | Status: AC
Start: 2020-04-01 — End: ?

## 2020-04-07 ENCOUNTER — Encounter

## 2020-04-08 MED ORDER — FINASTERIDE 5 MG TAB
5 mg | ORAL_TABLET | ORAL | 0 refills | Status: AC
Start: 2020-04-08 — End: ?

## 2020-05-06 ENCOUNTER — Encounter: Attending: Legal Medicine | Primary: Legal Medicine

## 2020-05-07 ENCOUNTER — Ambulatory Visit
Admit: 2020-05-07 | Discharge: 2020-05-07 | Attending: Student in an Organized Health Care Education/Training Program | Primary: Legal Medicine

## 2020-05-07 DIAGNOSIS — R339 Retention of urine, unspecified: Secondary | ICD-10-CM

## 2020-05-09 ENCOUNTER — Ambulatory Visit: Admit: 2020-05-09 | Discharge: 2020-05-09 | Payer: MEDICARE | Attending: Legal Medicine | Primary: Legal Medicine

## 2020-05-09 DIAGNOSIS — Z09 Encounter for follow-up examination after completed treatment for conditions other than malignant neoplasm: Secondary | ICD-10-CM

## 2020-05-09 MED ORDER — BUTALBITAL-ASPIRIN-CAFFEINE 50 MG-325 MG-40 MG TAB
50-325-40 mg | ORAL_TABLET | Freq: Four times a day (QID) | ORAL | 0 refills | Status: AC | PRN
Start: 2020-05-09 — End: 2020-05-19

## 2020-05-20 ENCOUNTER — Telehealth

## 2020-05-20 ENCOUNTER — Encounter: Primary: Legal Medicine

## 2020-05-20 MED ORDER — MIDODRINE 10 MG TAB
10 mg | ORAL_TABLET | Freq: Three times a day (TID) | ORAL | 0 refills | Status: AC
Start: 2020-05-20 — End: 2020-06-19

## 2020-05-21 ENCOUNTER — Telehealth

## 2020-05-28 ENCOUNTER — Encounter: Attending: Legal Medicine | Primary: Legal Medicine

## 2020-05-30 ENCOUNTER — Encounter: Attending: Student in an Organized Health Care Education/Training Program | Primary: Legal Medicine

## 2020-06-10 ENCOUNTER — Encounter

## 2020-06-28 DEATH — deceased

## 2020-07-03 ENCOUNTER — Other Ambulatory Visit: Payer: MEDICARE | Primary: Legal Medicine

## 2020-07-09 ENCOUNTER — Encounter: Attending: Student in an Organized Health Care Education/Training Program | Primary: Legal Medicine

## 2020-07-10 ENCOUNTER — Ambulatory Visit: Payer: MEDICARE | Attending: Internal Medicine | Primary: Legal Medicine
